# Patient Record
Sex: Female | Born: 1960
Health system: Southern US, Community
[De-identification: ages and names within clinical notes are randomized; demographics above are authoritative.]

## PROBLEM LIST (undated history)

## (undated) ENCOUNTER — Emergency Department: Admission: EM | Payer: Medicare Other | Source: Home / Self Care

## (undated) DIAGNOSIS — J189 Pneumonia, unspecified organism: Secondary | ICD-10-CM

## (undated) DIAGNOSIS — S32010A Wedge compression fracture of first lumbar vertebra, initial encounter for closed fracture: Secondary | ICD-10-CM

## (undated) DIAGNOSIS — K766 Portal hypertension: Secondary | ICD-10-CM

## (undated) DIAGNOSIS — D649 Anemia, unspecified: Secondary | ICD-10-CM

## (undated) DIAGNOSIS — K599 Functional intestinal disorder, unspecified: Secondary | ICD-10-CM

## (undated) DIAGNOSIS — K7460 Unspecified cirrhosis of liver: Secondary | ICD-10-CM

## (undated) DIAGNOSIS — Z8719 Personal history of other diseases of the digestive system: Secondary | ICD-10-CM

## (undated) DIAGNOSIS — I8500 Esophageal varices without bleeding: Secondary | ICD-10-CM

## (undated) DIAGNOSIS — I4892 Unspecified atrial flutter: Secondary | ICD-10-CM

## (undated) DIAGNOSIS — K759 Inflammatory liver disease, unspecified: Secondary | ICD-10-CM

## (undated) DIAGNOSIS — I959 Hypotension, unspecified: Secondary | ICD-10-CM

## (undated) DIAGNOSIS — K219 Gastro-esophageal reflux disease without esophagitis: Secondary | ICD-10-CM

## (undated) DIAGNOSIS — I499 Cardiac arrhythmia, unspecified: Secondary | ICD-10-CM

## (undated) DIAGNOSIS — C801 Malignant (primary) neoplasm, unspecified: Secondary | ICD-10-CM

## (undated) DIAGNOSIS — I10 Essential (primary) hypertension: Secondary | ICD-10-CM

## (undated) HISTORY — DX: Anemia, unspecified: D64.9

## (undated) HISTORY — DX: Esophageal varices without bleeding: I85.00

## (undated) HISTORY — DX: Unspecified cirrhosis of liver: K74.60

## (undated) HISTORY — PX: ABDOMINAL HYSTERECTOMY: SHX81

## (undated) HISTORY — DX: Unspecified atrial flutter: I48.92

## (undated) HISTORY — PX: TONSILLECTOMY: SUR1361

## (undated) HISTORY — DX: Portal hypertension: K76.6

## (undated) HISTORY — PX: APPENDECTOMY: SHX54

---

## 2013-03-28 ENCOUNTER — Emergency Department: Payer: Self-pay | Admitting: Emergency Medicine

## 2018-10-09 DIAGNOSIS — J209 Acute bronchitis, unspecified: Secondary | ICD-10-CM | POA: Diagnosis not present

## 2019-01-18 ENCOUNTER — Emergency Department: Payer: Medicare Other

## 2019-01-18 ENCOUNTER — Inpatient Hospital Stay
Admission: EM | Admit: 2019-01-18 | Discharge: 2019-01-19 | DRG: 433 | Disposition: A | Discharge status: Home / Self Care | Payer: Medicare Other | Attending: Internal Medicine | Admitting: Internal Medicine

## 2019-01-18 ENCOUNTER — Encounter: Payer: Self-pay | Admitting: Emergency Medicine

## 2019-01-18 ENCOUNTER — Other Ambulatory Visit: Payer: Self-pay

## 2019-01-18 DIAGNOSIS — I4892 Unspecified atrial flutter: Secondary | ICD-10-CM | POA: Diagnosis present

## 2019-01-18 DIAGNOSIS — K7460 Unspecified cirrhosis of liver: Secondary | ICD-10-CM | POA: Diagnosis present

## 2019-01-18 DIAGNOSIS — Z9119 Patient's noncompliance with other medical treatment and regimen: Secondary | ICD-10-CM | POA: Diagnosis not present

## 2019-01-18 DIAGNOSIS — Z9114 Patient's other noncompliance with medication regimen: Secondary | ICD-10-CM

## 2019-01-18 DIAGNOSIS — Z90710 Acquired absence of both cervix and uterus: Secondary | ICD-10-CM | POA: Diagnosis not present

## 2019-01-18 DIAGNOSIS — D649 Anemia, unspecified: Secondary | ICD-10-CM

## 2019-01-18 DIAGNOSIS — D638 Anemia in other chronic diseases classified elsewhere: Secondary | ICD-10-CM | POA: Diagnosis present

## 2019-01-18 DIAGNOSIS — Z7289 Other problems related to lifestyle: Secondary | ICD-10-CM

## 2019-01-18 DIAGNOSIS — E871 Hypo-osmolality and hyponatremia: Secondary | ICD-10-CM | POA: Diagnosis present

## 2019-01-18 DIAGNOSIS — R0600 Dyspnea, unspecified: Secondary | ICD-10-CM | POA: Diagnosis not present

## 2019-01-18 DIAGNOSIS — Z1159 Encounter for screening for other viral diseases: Secondary | ICD-10-CM

## 2019-01-18 DIAGNOSIS — Z8572 Personal history of non-Hodgkin lymphomas: Secondary | ICD-10-CM | POA: Diagnosis not present

## 2019-01-18 DIAGNOSIS — C801 Malignant (primary) neoplasm, unspecified: Secondary | ICD-10-CM | POA: Insufficient documentation

## 2019-01-18 DIAGNOSIS — Z716 Tobacco abuse counseling: Secondary | ICD-10-CM | POA: Diagnosis not present

## 2019-01-18 DIAGNOSIS — F17210 Nicotine dependence, cigarettes, uncomplicated: Secondary | ICD-10-CM | POA: Diagnosis present

## 2019-01-18 DIAGNOSIS — K7031 Alcoholic cirrhosis of liver with ascites: Secondary | ICD-10-CM | POA: Diagnosis not present

## 2019-01-18 DIAGNOSIS — I10 Essential (primary) hypertension: Secondary | ICD-10-CM | POA: Diagnosis present

## 2019-01-18 DIAGNOSIS — Z66 Do not resuscitate: Secondary | ICD-10-CM | POA: Diagnosis present

## 2019-01-18 DIAGNOSIS — R188 Other ascites: Secondary | ICD-10-CM | POA: Diagnosis present

## 2019-01-18 DIAGNOSIS — K709 Alcoholic liver disease, unspecified: Secondary | ICD-10-CM | POA: Diagnosis present

## 2019-01-18 DIAGNOSIS — I361 Nonrheumatic tricuspid (valve) insufficiency: Secondary | ICD-10-CM | POA: Diagnosis not present

## 2019-01-18 DIAGNOSIS — R0602 Shortness of breath: Secondary | ICD-10-CM | POA: Diagnosis not present

## 2019-01-18 DIAGNOSIS — Z720 Tobacco use: Secondary | ICD-10-CM | POA: Diagnosis not present

## 2019-01-18 DIAGNOSIS — D62 Acute posthemorrhagic anemia: Secondary | ICD-10-CM | POA: Diagnosis present

## 2019-01-18 HISTORY — DX: Malignant (primary) neoplasm, unspecified: C80.1

## 2019-01-18 HISTORY — DX: Essential (primary) hypertension: I10

## 2019-01-18 LAB — COMPREHENSIVE METABOLIC PANEL
ALT: 31 U/L (ref 0–44)
AST: 85 U/L — ABNORMAL HIGH (ref 15–41)
Albumin: 4.1 g/dL (ref 3.5–5.0)
Alkaline Phosphatase: 404 U/L — ABNORMAL HIGH (ref 38–126)
Anion gap: 12 (ref 5–15)
BUN: 8 mg/dL (ref 6–20)
CO2: 19 mmol/L — ABNORMAL LOW (ref 22–32)
Calcium: 9 mg/dL (ref 8.9–10.3)
Chloride: 96 mmol/L — ABNORMAL LOW (ref 98–111)
Creatinine, Ser: 0.49 mg/dL (ref 0.44–1.00)
GFR calc Af Amer: >60 mL/min (ref >60)
GFR calc non Af Amer: >60 mL/min (ref >60)
Glucose, Bld: 109 mg/dL — ABNORMAL HIGH (ref 70–99)
Potassium: 4.5 mmol/L (ref 3.5–5.1)
Sodium: 127 mmol/L — ABNORMAL LOW (ref 135–145)
Total Bilirubin: 2 mg/dL — ABNORMAL HIGH (ref 0.3–1.2)
Total Protein: 7 g/dL (ref 6.5–8.1)

## 2019-01-18 LAB — IRON AND TIBC
Iron: 67 ug/dL (ref 28–170)
Saturation Ratios: 12 % (ref 10.4–31.8)
TIBC: 556 ug/dL — ABNORMAL HIGH (ref 250–450)
UIBC: 489 ug/dL

## 2019-01-18 LAB — TROPONIN I
Troponin I: <0.03 ng/mL (ref <0.03)
Troponin I: <0.03 ng/mL (ref <0.03)

## 2019-01-18 LAB — ABO/RH: ABO/RH(D): O POS

## 2019-01-18 LAB — BRAIN NATRIURETIC PEPTIDE: B Natriuretic Peptide: 771 pg/mL — ABNORMAL HIGH (ref 0.0–100.0)

## 2019-01-18 LAB — HEMOGLOBIN AND HEMATOCRIT, BLOOD
HCT: 26.4 % — ABNORMAL LOW (ref 36.0–46.0)
Hemoglobin: 8.3 g/dL — ABNORMAL LOW (ref 12.0–15.0)

## 2019-01-18 LAB — SARS CORONAVIRUS 2 BY RT PCR (HOSPITAL ORDER, PERFORMED IN ~~LOC~~ HOSPITAL LAB): SARS Coronavirus 2: NEGATIVE

## 2019-01-18 LAB — CBC WITH DIFFERENTIAL/PLATELET
Abs Immature Granulocytes: 0.08 10*3/uL — ABNORMAL HIGH (ref 0.00–0.07)
Basophils Absolute: 0.1 10*3/uL (ref 0.0–0.1)
Basophils Relative: 1 %
Eosinophils Absolute: 0.2 10*3/uL (ref 0.0–0.5)
Eosinophils Relative: 1 %
HCT: 24.7 % — ABNORMAL LOW (ref 36.0–46.0)
Hemoglobin: 7.5 g/dL — ABNORMAL LOW (ref 12.0–15.0)
Immature Granulocytes: 1 %
Lymphocytes Relative: 13 %
Lymphs Abs: 1.9 10*3/uL (ref 0.7–4.0)
MCH: 23.2 pg — ABNORMAL LOW (ref 26.0–34.0)
MCHC: 30.4 g/dL (ref 30.0–36.0)
MCV: 76.5 fL — ABNORMAL LOW (ref 80.0–100.0)
Monocytes Absolute: 1.6 10*3/uL — ABNORMAL HIGH (ref 0.1–1.0)
Monocytes Relative: 11 %
Neutro Abs: 10.6 10*3/uL — ABNORMAL HIGH (ref 1.7–7.7)
Neutrophils Relative %: 73 %
Platelets: 379 10*3/uL (ref 150–400)
RBC: 3.23 MIL/uL — ABNORMAL LOW (ref 3.87–5.11)
RDW: 16.9 % — ABNORMAL HIGH (ref 11.5–15.5)
WBC: 14.4 10*3/uL — ABNORMAL HIGH (ref 4.0–10.5)
nRBC: 0 % (ref 0.0–0.2)

## 2019-01-18 LAB — FOLATE: Folate: 8.9 ng/mL (ref >5.9)

## 2019-01-18 LAB — PREPARE RBC (CROSSMATCH)

## 2019-01-18 IMAGING — DX PORTABLE CHEST - 1 VIEW
1 series · 1 of 1 positions shown · non-contrast
Comparison: None.

CLINICAL DATA: Shortness of breath, lower extremity swelling.

EXAM:
PORTABLE CHEST 1 VIEW

[chest ap]
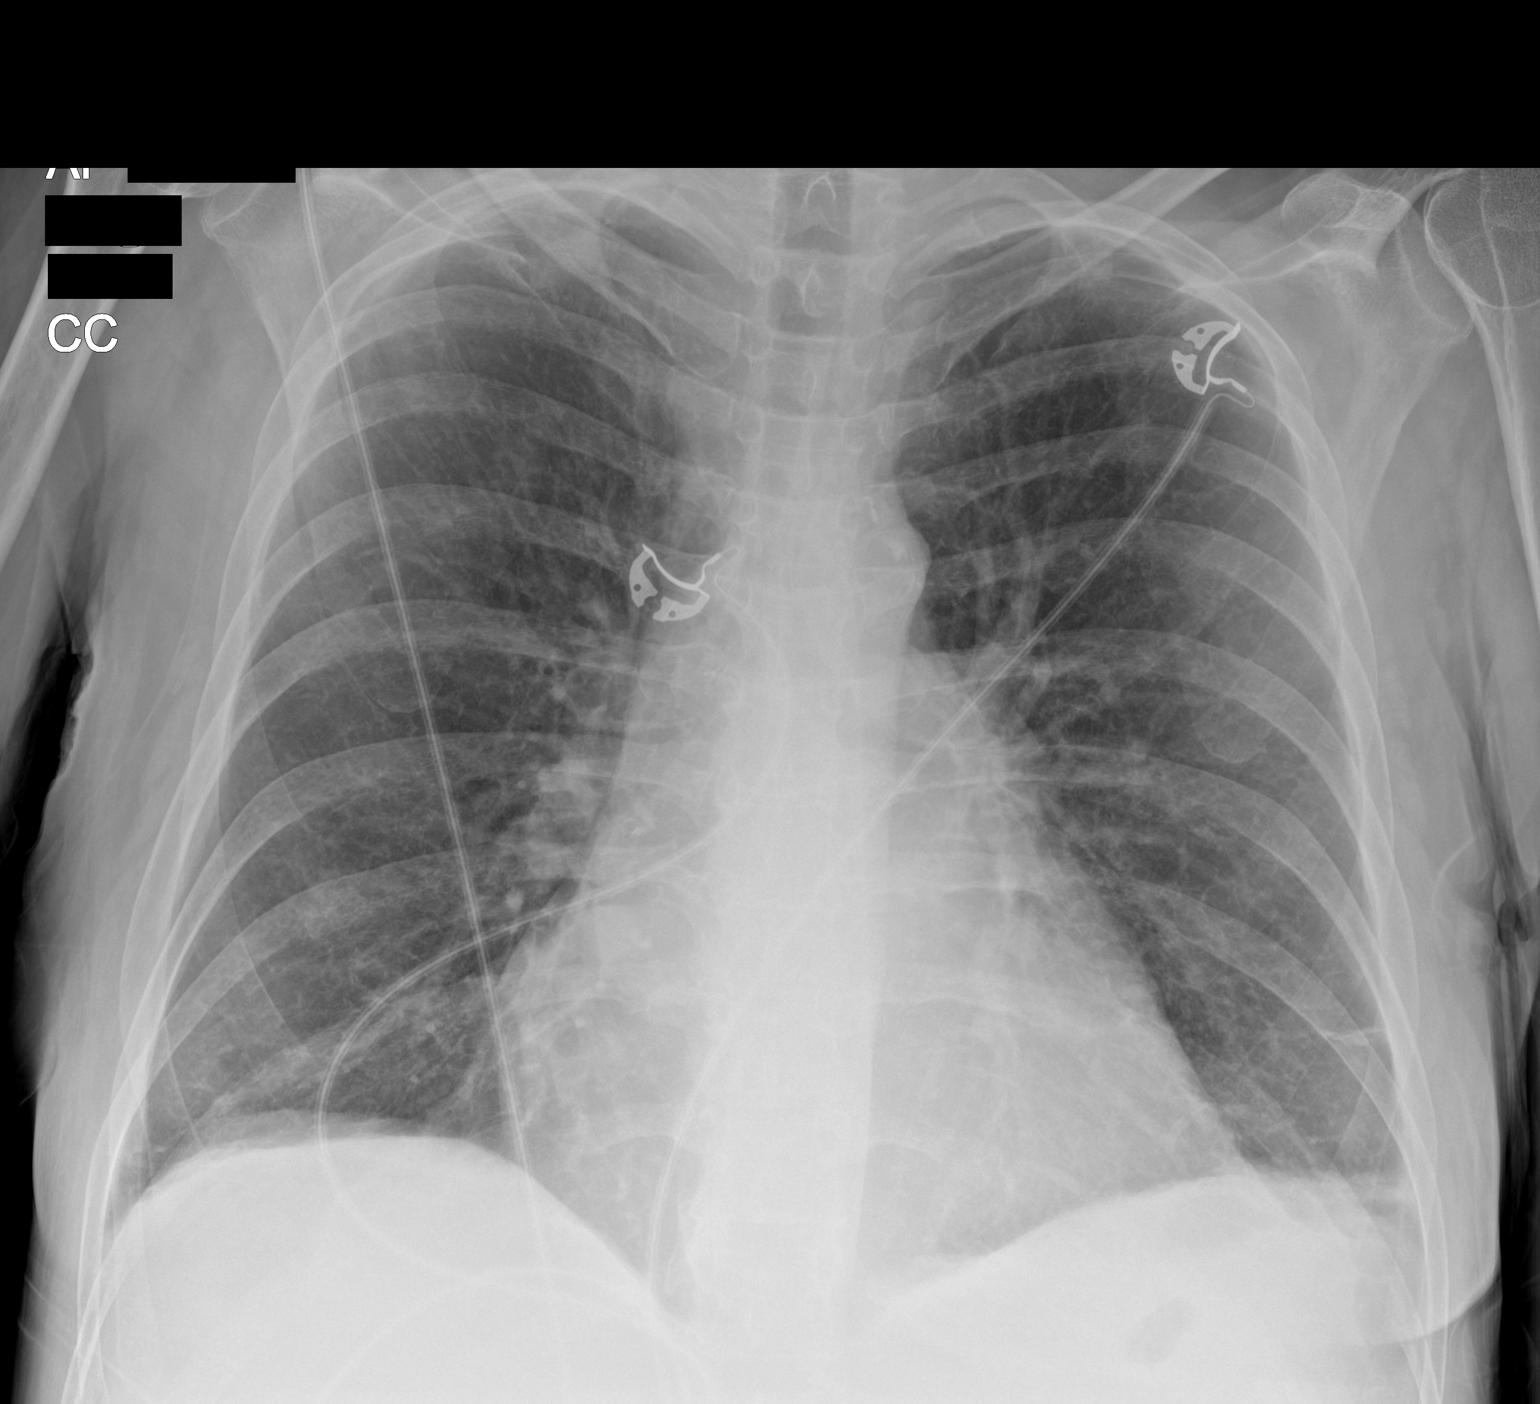

[1 of 1 positions shown; findings below may reference images not displayed]

FINDINGS: The heart size and mediastinal contours are within normal limits. No
pneumothorax or pleural effusion is noted. Right lung is clear.
Minimal left basilar subsegmental atelectasis or scarring is noted.
The visualized skeletal structures are unremarkable.
IMPRESSION: Minimal left basilar subsegmental atelectasis or scarring.

## 2019-01-18 IMAGING — CT CT ABDOMEN AND PELVIS WITH CONTRAST
2 of 5 series · 15 of 46 positions shown, 17 images · IV contrast (APPLIED)
Comparison: None.

CLINICAL DATA: Lower extremity swelling

EXAM:
CT ABDOMEN AND PELVIS WITH CONTRAST
TECHNIQUE: Multidetector CT imaging of the abdomen and pelvis was performed
using the standard protocol following bolus administration of
intravenous contrast.
CONTRAST:  100mL OMNIPAQUE IOHEXOL 300 MG/ML  SOLN

[Series 2: axial st · axial · 0.74mm/px · z∈[-1220,-790]mm · 12 of 98 slices shown, 14 images]
[im 6/98  soft-tissue]
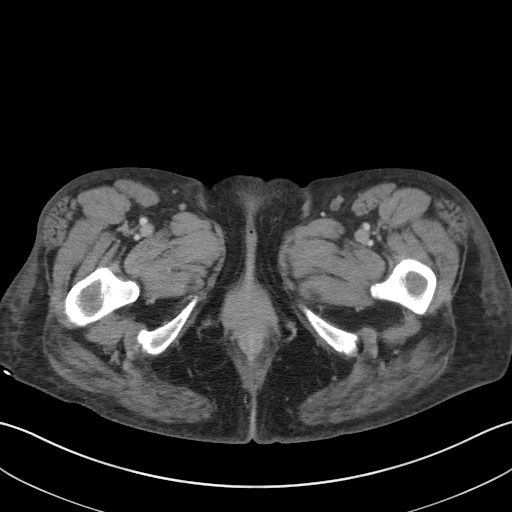
[im 6/98  bone]
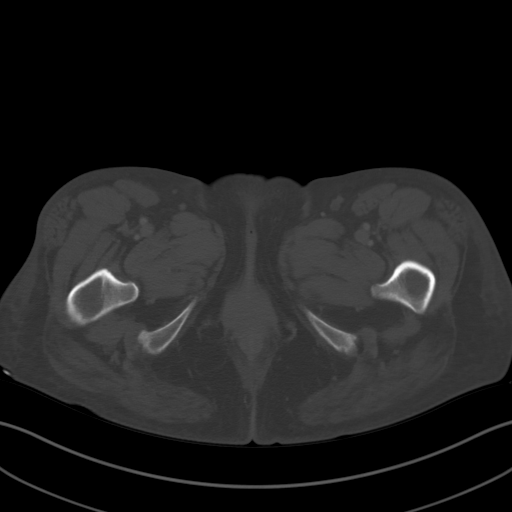
[im 16/98  soft-tissue]
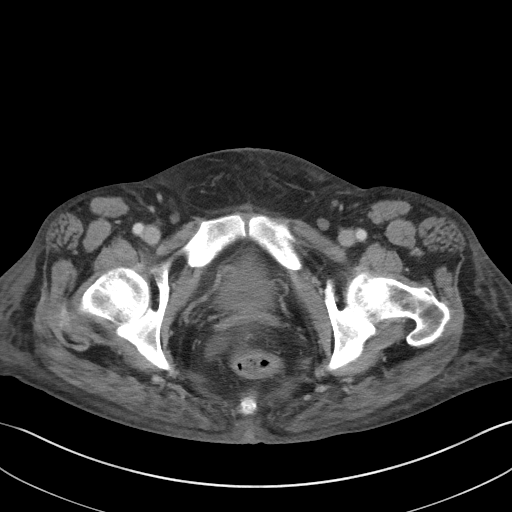
[im 21/98  soft-tissue]
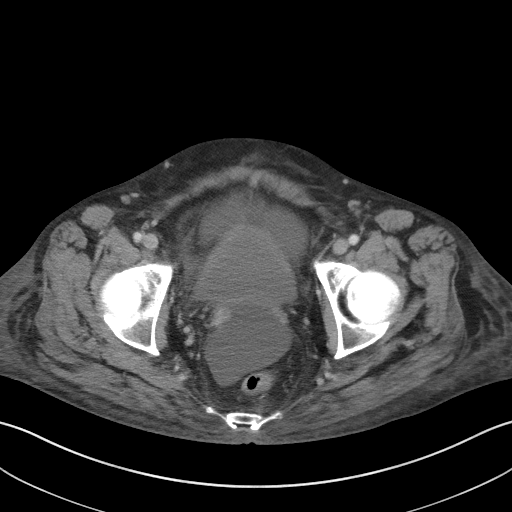
[im 31/98  soft-tissue]
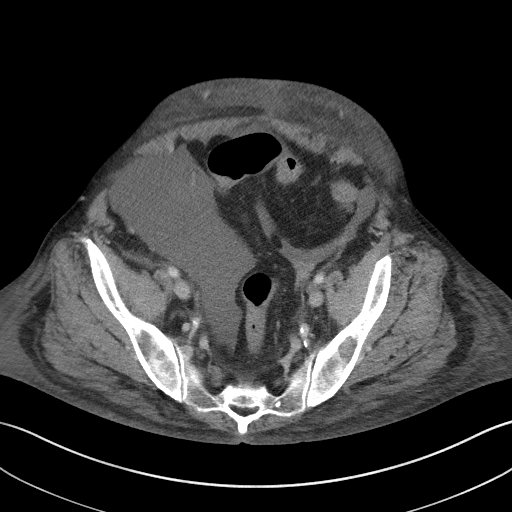
[im 36/98  soft-tissue]
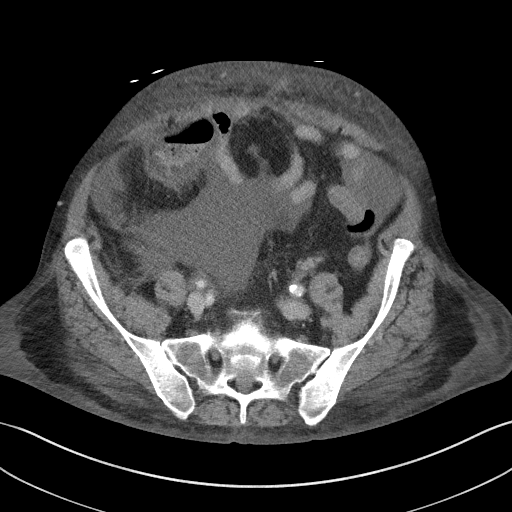
[im 46/98  soft-tissue]
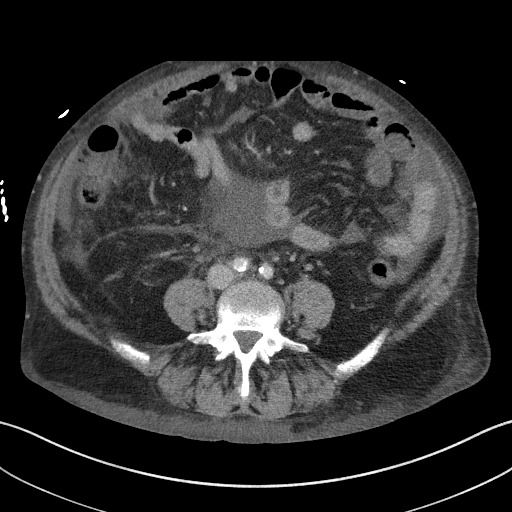
[im 52/98  soft-tissue]
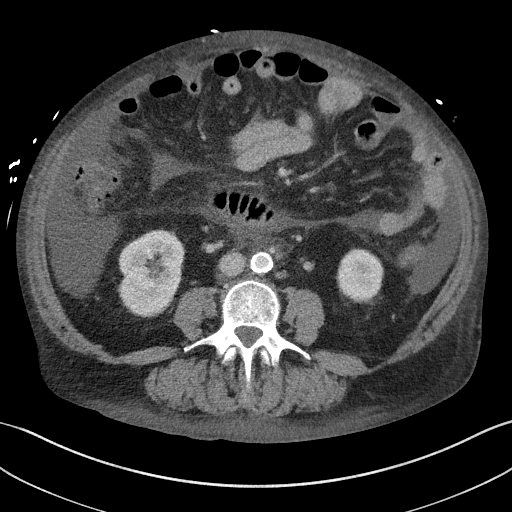
[im 62/98  soft-tissue]
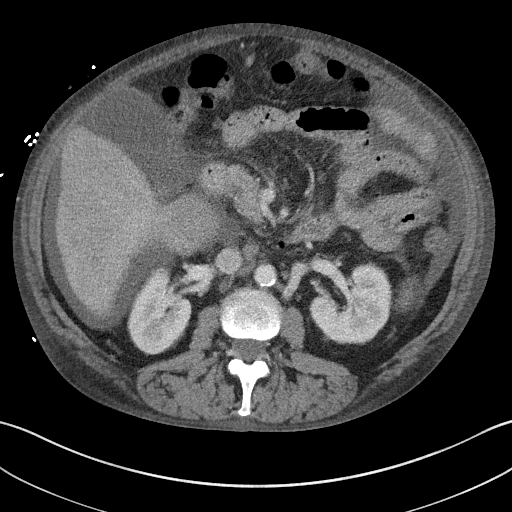
[im 67/98  soft-tissue]
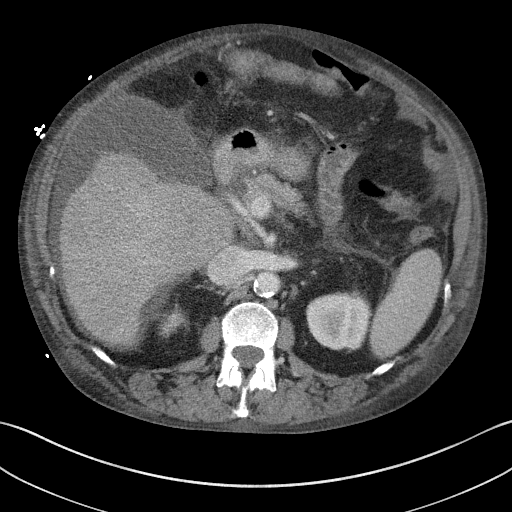
[im 67/98  bone]
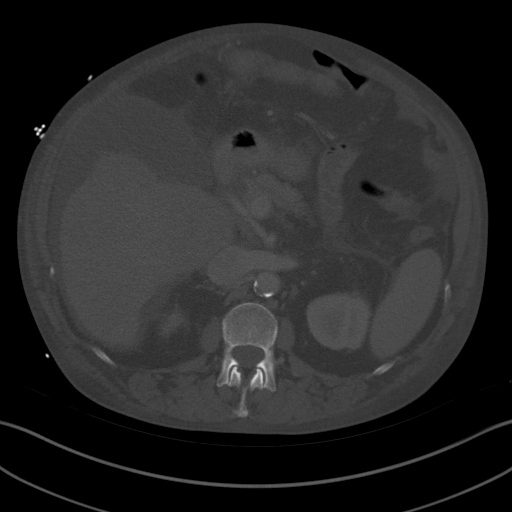
[im 77/98  soft-tissue]
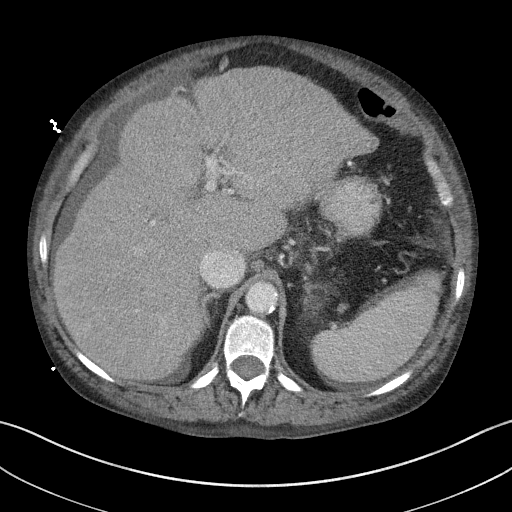
[im 82/98  soft-tissue]
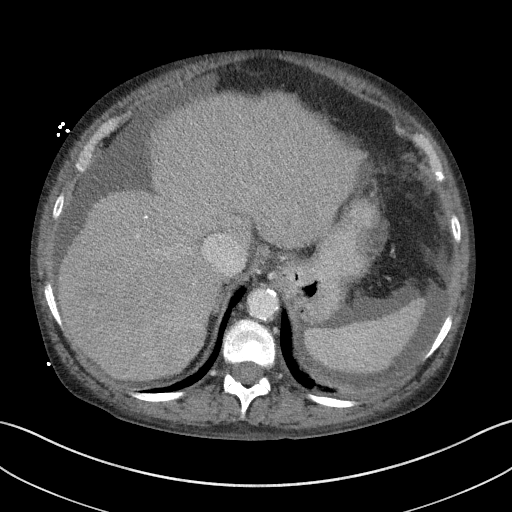
[im 92/98  soft-tissue]
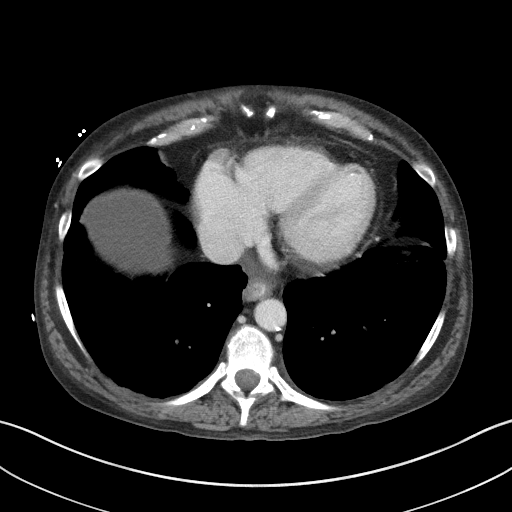

[Series 6: coronal st · coronal · 0.68mm/px · 3 of 105 slices shown]
[im 35/105  soft-tissue]
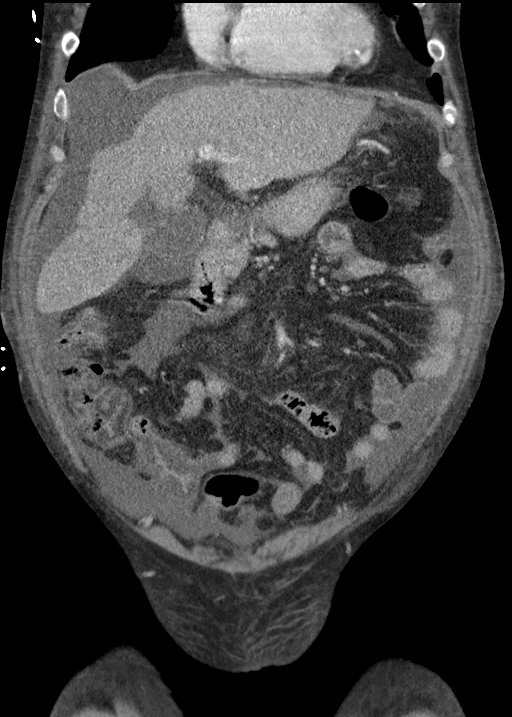
[im 47/105  soft-tissue]
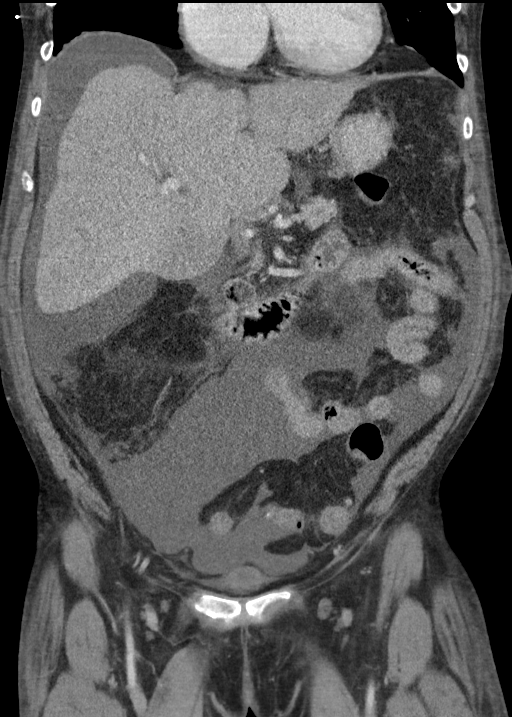
[im 58/105  soft-tissue]
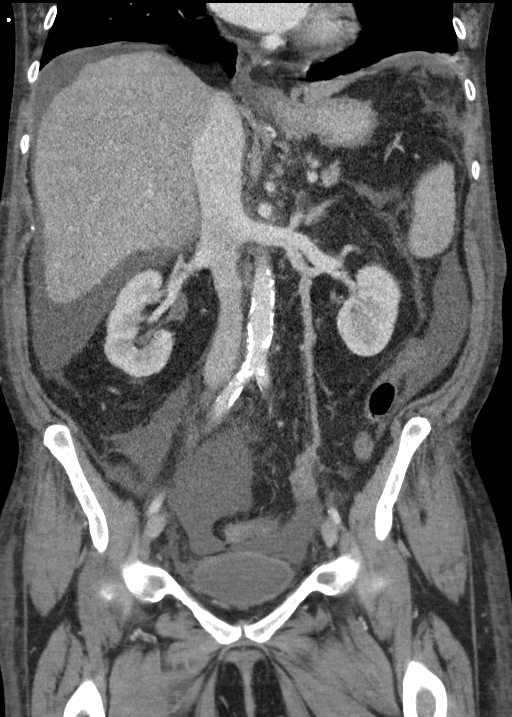

[15 of 46 positions shown; findings below may reference images not displayed]

FINDINGS: LOWER CHEST: There is no basilar pleural or apical pericardial
effusion.

HEPATOBILIARY: Diffusely nodular hepatic contours with relative
hypertrophy of the caudate and left hepatic lobe, consistent with
hepatic cirrhosis. No focal liver lesion. No biliary dilatation.
There is intermediate volume ascites throughout the abdomen.
Gallbladder is distended.

PANCREAS: The pancreatic parenchymal contours are normal and there
is no ductal dilatation. There is no peripancreatic fluid
collection.

SPLEEN: Normal.

ADRENALS/URINARY TRACT:

--Adrenal glands: Normal.

--Right kidney/ureter: No hydronephrosis, nephroureterolithiasis,
perinephric stranding or solid renal mass.

--Left kidney/ureter: No hydronephrosis, nephroureterolithiasis,
perinephric stranding or solid renal mass.

--Urinary bladder: Normal for degree of distention

STOMACH/BOWEL:

--Stomach/Duodenum: There is no hiatal hernia or other gastric
abnormality. The duodenal course and caliber are normal.

--Small bowel: No dilatation or inflammation.

--Colon: No focal abnormality.

--Appendix: Not clearly visualized

VASCULAR/LYMPHATIC: There is aortic atherosclerosis without
hemodynamically significant stenosis. The portal vein, splenic vein,
superior mesenteric vein and IVC are patent. No abdominal or pelvic
lymphadenopathy.

REPRODUCTIVE: Status post hysterectomy. No adnexal mass.

MUSCULOSKELETAL. No bony spinal canal stenosis or focal osseous
abnormality.

OTHER: None.
IMPRESSION: 1. Hepatic cirrhosis without acute abdominal or pelvic abnormality.
2. Intermediate volume abdominopelvic ascites without other stigmata
of portal hypertension.
3.  Aortic atherosclerosis ([76]-[76]).

## 2019-01-18 MED ORDER — SPIRONOLACTONE 25 MG PO TABS
25.0000 mg | ORAL_TABLET | Freq: Every day | ORAL | Status: DC
  Administered 2019-01-18: 19:00:00 25 mg via ORAL
  Filled 2019-01-18: qty 1

## 2019-01-18 MED ORDER — ENOXAPARIN SODIUM 40 MG/0.4ML ~~LOC~~ SOLN
40.0000 mg | SUBCUTANEOUS | Status: DC
  Administered 2019-01-18: 23:00:00 40 mg via SUBCUTANEOUS
  Filled 2019-01-18: qty 0.4

## 2019-01-18 MED ORDER — ALUM & MAG HYDROXIDE-SIMETH 200-200-20 MG/5ML PO SUSP
30.0000 mL | Freq: Once | ORAL | Status: AC
  Administered 2019-01-18: 30 mL via ORAL
  Filled 2019-01-18: qty 30

## 2019-01-18 MED ORDER — IOHEXOL 300 MG/ML  SOLN
100.0000 mL | Freq: Once | INTRAMUSCULAR | Status: AC | PRN
  Administered 2019-01-18: 100 mL via INTRAVENOUS
  Filled 2019-01-18: qty 100

## 2019-01-18 MED ORDER — ONDANSETRON HCL 4 MG/2ML IJ SOLN
4.0000 mg | Freq: Once | INTRAMUSCULAR | Status: AC
  Administered 2019-01-18: 4 mg via INTRAVENOUS
  Filled 2019-01-18: qty 2

## 2019-01-18 MED ORDER — FUROSEMIDE 10 MG/ML IJ SOLN
40.0000 mg | Freq: Two times a day (BID) | INTRAMUSCULAR | Status: DC
  Administered 2019-01-18 – 2019-01-19 (×2): 40 mg via INTRAVENOUS
  Filled 2019-01-18 (×2): qty 4

## 2019-01-18 MED ORDER — MORPHINE SULFATE (PF) 4 MG/ML IV SOLN
4.0000 mg | Freq: Once | INTRAVENOUS | Status: AC
  Administered 2019-01-18: 4 mg via INTRAVENOUS
  Filled 2019-01-18: qty 1

## 2019-01-18 MED ORDER — OXYCODONE HCL 5 MG PO TABS
5.0000 mg | ORAL_TABLET | ORAL | Status: DC | PRN
  Administered 2019-01-18 – 2019-01-19 (×3): 5 mg via ORAL
  Filled 2019-01-18 (×3): qty 1

## 2019-01-18 MED ORDER — SODIUM CHLORIDE 0.9 % IV SOLN: 10.0000 mL/h | Freq: Once | INTRAVENOUS | Status: DC

## 2019-01-18 NOTE — ED Notes (Signed)
Patient went to ct scan

## 2019-01-18 NOTE — ED Notes (Signed)
ED TO INPATIENT HANDOFF REPORT  ED Nurse Name and Phone #:  Jinny Blossom [phone]  S Name/Age/Gender Anita Reed 58 y.o. female Room/Bed: ED30A/ED30A  Code Status   Code Status: DNR  Home/SNF/Other Home Patient oriented to: self, place, time and situation Is this baseline? Yes   Triage Complete: Triage complete  Chief Complaint swelling  Triage Note Says for 3 weeks gradual onset and worsening with lower extremity swelling and now abdominal swelling and shortness of breath.   Allergies No Known Allergies  Level of Care/Admitting Diagnosis ED Disposition    ED Disposition Condition [facility] Area: [place] [[id number]]  Level of Care: Med-Surg [16]  Covid Evaluation: Screening Protocol (No Symptoms)  Diagnosis: Ascites [272536]  Admitting Physician: Otila Back Branch  Attending Physician: Otila Back [[id number]]  Estimated length of stay: past midnight tomorrow  Certification:: I certify this patient will need inpatient services for at least 2 midnights  PT Class (Do Not Modify): Inpatient [101]  PT Acc Code (Do Not Modify): Private [1]       B Medical/Surgery History Past Medical History:  Diagnosis Date  . Cancer ([place])   . Hypertension    Past Surgical History:  Procedure Laterality Date  . ABDOMINAL HYSTERECTOMY    . APPENDECTOMY       A IV Location/Drains/Wounds Patient Lines/Drains/Airways Status   Active Line/Drains/Airways    Name:   Placement date:   Placement time:   Site:   Days:   Peripheral IV 01/18/19 Right Forearm   01/18/19    1238    Forearm   less than 1          Intake/Output Last 24 hours No intake or output data in the 24 hours ending 01/18/19 1713  Labs/Imaging Results for orders placed or performed during the hospital encounter of 01/18/19 (from the past 48 hour(s))  Brain natriuretic peptide     Status: Abnormal   Collection Time: 01/18/19 12:20 PM  Result Value Ref Range   B Natriuretic  Peptide 771.0 (H) 0.0 - 100.0 pg/mL    Comment: Performed at [facility], [address] [place], [place] [address]  CBC with Differential     Status: Abnormal   Collection Time: 01/18/19 12:20 PM  Result Value Ref Range   WBC 14.4 (H) 4.0 - 10.5 K/uL   RBC 3.23 (L) 3.87 - 5.11 MIL/uL   Hemoglobin 7.5 (L) 12.0 - 15.0 g/dL    Comment: Reticulocyte Hemoglobin testing may be clinically indicated, consider ordering this additional test [id number]    HCT 24.7 (L) 36.0 - 46.0 %   MCV 76.5 (L) 80.0 - 100.0 fL   MCH 23.2 (L) 26.0 - 34.0 pg   MCHC 30.4 30.0 - 36.0 g/dL   RDW 16.9 (H) 11.5 - 15.5 %   Platelets 379 150 - 400 K/uL   nRBC 0.0 0.0 - 0.2 %   Neutrophils Relative % 73 %   Neutro Abs 10.6 (H) 1.7 - 7.7 K/uL   Lymphocytes Relative 13 %   Lymphs Abs 1.9 0.7 - 4.0 K/uL   Monocytes Relative 11 %   Monocytes Absolute 1.6 (H) 0.1 - 1.0 K/uL   Eosinophils Relative 1 %   Eosinophils Absolute 0.2 0.0 - 0.5 K/uL   Basophils Relative 1 %   Basophils Absolute 0.1 0.0 - 0.1 K/uL   Immature Granulocytes 1 %   Abs Immature Granulocytes 0.08 (H) 0.00 - 0.07 K/uL    Comment:  Performed at [facility], [address] [place], [place] [address]  Comprehensive metabolic panel     Status: Abnormal   Collection Time: 01/18/19 12:20 PM  Result Value Ref Range   Sodium 127 (L) 135 - 145 mmol/L   Potassium 4.5 3.5 - 5.1 mmol/L   Chloride 96 (L) 98 - 111 mmol/L   CO2 19 (L) 22 - 32 mmol/L   Glucose, Bld 109 (H) 70 - 99 mg/dL   BUN 8 6 - 20 mg/dL   Creatinine, Ser 0.49 0.44 - 1.00 mg/dL   Calcium 9.0 8.9 - 10.3 mg/dL   Total Protein 7.0 6.5 - 8.1 g/dL   Albumin 4.1 3.5 - 5.0 g/dL   AST 85 (H) 15 - 41 U/L   ALT 31 0 - 44 U/L   Alkaline Phosphatase 404 (H) 38 - 126 U/L   Total Bilirubin 2.0 (H) 0.3 - 1.2 mg/dL   GFR calc non Af Amer >60 >60 mL/min   GFR calc Af Amer >60 >60 mL/min   Anion gap 12 5 - 15    Comment: Performed at [facility], [address]  [place], [place] [address]  Troponin I - ONCE - STAT     Status: None   Collection Time: 01/18/19 12:20 PM  Result Value Ref Range   Troponin I <0.03 <0.03 ng/mL    Comment: Performed at [facility], [address] [place], [place] [address]  SARS Coronavirus 2 (CEPHEID - Performed in [place] hospital lab), Hosp Order     Status: None   Collection Time: 01/18/19 12:20 PM  Result Value Ref Range   SARS Coronavirus 2 NEGATIVE NEGATIVE    Comment: (NOTE) If result is NEGATIVE SARS-CoV-2 target nucleic acids are NOT DETECTED. The SARS-CoV-2 RNA is generally detectable in upper and lower  respiratory specimens during the acute phase of infection. The lowest  concentration of SARS-CoV-2 viral copies this assay can detect is 250  copies / mL. A negative result does not preclude SARS-CoV-2 infection  and should not be used as the sole basis for treatment or other  patient management decisions.  A negative result may occur with  improper specimen collection / handling, submission of specimen other  than nasopharyngeal swab, presence of viral mutation(s) within the  areas targeted by this assay, and inadequate number of viral copies  (<250 copies / mL). A negative result must be combined with clinical  observations, patient history, and epidemiological information. If result is POSITIVE SARS-CoV-2 target nucleic acids are DETECTED. The SARS-CoV-2 RNA is generally detectable in upper and lower  respiratory specimens dur ing the acute phase of infection.  Positive  results are indicative of active infection with SARS-CoV-2.  Clinical  correlation with patient history and other diagnostic information is  necessary to determine patient infection status.  Positive results do  not rule out bacterial infection or co-infection with other viruses. If result is PRESUMPTIVE POSTIVE SARS-CoV-2 nucleic acids MAY BE PRESENT.   A presumptive positive result was obtained on the submitted specimen   and confirmed on repeat testing.  While 2019 novel coronavirus  (SARS-CoV-2) nucleic acids may be present in the submitted sample  additional confirmatory testing may be necessary for epidemiological  and / or clinical management purposes  to differentiate between  SARS-CoV-2 and other Sarbecovirus currently known to infect humans.  If clinically indicated additional testing with an alternate test  methodology [phone]) is advised. The SARS-CoV-2 RNA is generally  detectable  in upper and lower respiratory sp ecimens during the acute  phase of infection. The expected result is Negative. Fact Sheet for Patients:  StrictlyIdeas.no Fact Sheet for Healthcare Providers: BankingDealers.co.za This test is not yet approved or cleared by the [place] FDA and has been authorized for detection and/or diagnosis of SARS-CoV-2 by FDA under an Emergency Use Authorization (EUA).  This EUA will remain in effect (meaning this test can be used) for the duration of the COVID-19 declaration under Section 564(b)(1) of the Act, 21 U.S.C. section 360bbb-3(b)(1), unless the authorization is terminated or revoked sooner. Performed at [facility], [address] [place], [place] [address]   ABO/Rh     Status: None   Collection Time: 01/18/19 12:20 PM  Result Value Ref Range   ABO/RH(D)      O POS Performed at [facility] [place], [address] [place], [place] [address]   Prepare RBC     Status: None   Collection Time: 01/18/19  2:34 PM  Result Value Ref Range   Order Confirmation      ORDER PROCESSED BY BLOOD BANK Performed at [facility], [address] [place], [place] [address]   Type and screen [place]     Status: None (Preliminary result)   Collection Time: 01/18/19  3:02 PM  Result Value Ref Range   ABO/RH(D) O POS    Antibody Screen NEG    Sample Expiration 01/21/2019,2359    Unit Number  [id number]    Blood Component Type RED CELLS,LR    Unit division 00    Status of Unit ISSUED    Transfusion Status OK TO TRANSFUSE    Crossmatch Result      Compatible Performed at [facility], [place], [place] [address]    Ct Abdomen Pelvis W Contrast  Result Date: 01/18/2019 CLINICAL DATA:  Lower extremity swelling EXAM: CT ABDOMEN AND PELVIS WITH CONTRAST TECHNIQUE: Multidetector CT imaging of the abdomen and pelvis was performed using the standard protocol following bolus administration of intravenous contrast. CONTRAST:  140mL OMNIPAQUE IOHEXOL 300 MG/ML  SOLN COMPARISON:  None. FINDINGS: LOWER CHEST: There is no basilar pleural or apical pericardial effusion. HEPATOBILIARY: Diffusely nodular hepatic contours with relative hypertrophy of the caudate and left hepatic lobe, consistent with hepatic cirrhosis. No focal liver lesion. No biliary dilatation. There is intermediate volume ascites throughout the abdomen. Gallbladder is distended. PANCREAS: The pancreatic parenchymal contours are normal and there is no ductal dilatation. There is no peripancreatic fluid collection. SPLEEN: Normal. ADRENALS/URINARY TRACT: --Adrenal glands: Normal. --Right kidney/ureter: No hydronephrosis, nephroureterolithiasis, perinephric stranding or solid renal mass. --Left kidney/ureter: No hydronephrosis, nephroureterolithiasis, perinephric stranding or solid renal mass. --Urinary bladder: Normal for degree of distention STOMACH/BOWEL: --Stomach/Duodenum: There is no hiatal hernia or other gastric abnormality. The duodenal course and caliber are normal. --Small bowel: No dilatation or inflammation. --Colon: No focal abnormality. --Appendix: Not clearly visualized VASCULAR/LYMPHATIC: There is aortic atherosclerosis without hemodynamically significant stenosis. The portal vein, splenic vein, superior mesenteric vein and IVC are patent. No abdominal or pelvic lymphadenopathy. REPRODUCTIVE:  Status post hysterectomy. No adnexal mass. MUSCULOSKELETAL. No bony spinal canal stenosis or focal osseous abnormality. OTHER: None. IMPRESSION: 1. Hepatic cirrhosis without acute abdominal or pelvic abnormality. 2. Intermediate volume abdominopelvic ascites without other stigmata of portal hypertension. 3.  Aortic atherosclerosis (ICD10-I70.0). Electronically Signed   By: Ulyses Jarred M.D.   On: 01/18/2019 14:26   Dg Chest Port 1 View  Result Date:  01/18/2019 CLINICAL DATA:  Shortness of breath, lower extremity swelling. EXAM: PORTABLE CHEST 1 VIEW COMPARISON:  None. FINDINGS: The heart size and mediastinal contours are within normal limits. No pneumothorax or pleural effusion is noted. Right lung is clear. Minimal left basilar subsegmental atelectasis or scarring is noted. The visualized skeletal structures are unremarkable. IMPRESSION: Minimal left basilar subsegmental atelectasis or scarring. Electronically Signed   By: Marijo Conception M.D.   On: 01/18/2019 12:46    Pending Labs Unresulted Labs (From admission, onward)    Start     Ordered   01/19/19 5102  Basic metabolic panel  Tomorrow morning,   STAT     01/18/19 1654   01/19/19 0500  CBC  Tomorrow morning,   STAT     01/18/19 1654   01/19/19 0500  Magnesium  Tomorrow morning,   STAT     01/18/19 1654   01/18/19 1655  Hepatitis panel, acute  Once,   STAT     01/18/19 1654   01/18/19 1654  Iron and TIBC  Once,   STAT     01/18/19 1654   01/18/19 1654  Vitamin B12  Once,   STAT     01/18/19 1654   01/18/19 1654  Folate  Once,   STAT     01/18/19 1654   01/18/19 1650  HIV antibody (Routine Testing)  Once,   STAT     01/18/19 1651          Vitals/Pain Today's Vitals   01/18/19 1520 01/18/19 1640 01/18/19 1656 01/18/19 1703  BP: 95/67 101/70 103/73   Pulse: (!) 117 (!) 117 (!) 118   Resp: (!) 22 (!) 21 14   Temp:  98.8 F (37.1 C) 98.4 F (36.9 C)   TempSrc:  Oral Oral   SpO2: 96% 98%  100%  Weight:      Height:       PainSc: 4        Isolation Precautions No active isolations  Medications Medications  0.9 %  sodium chloride infusion (has no administration in time range)  enoxaparin (LOVENOX) injection 40 mg (has no administration in time range)  furosemide (LASIX) injection 40 mg (has no administration in time range)  spironolactone (ALDACTONE) tablet 25 mg (has no administration in time range)  morphine 4 MG/ML injection 4 mg (4 mg Intravenous Given 01/18/19 1349)  ondansetron (ZOFRAN) injection 4 mg (4 mg Intravenous Given 01/18/19 1354)  iohexol (OMNIPAQUE) 300 MG/ML solution 100 mL (100 mLs Intravenous Contrast Given 01/18/19 1406)    Mobility walks Low fall risk   Focused Assessments See focused assessment   R Recommendations: See Admitting Provider Note  Report given to:   Additional Notes:  Blood transfusing

## 2019-01-18 NOTE — ED Provider Notes (Signed)
[facility] Emergency Department Provider Note  ____________________________________________  Time seen: Approximately 12:53 PM  I have reviewed the triage vital signs and the nursing notes.   HISTORY  Chief Complaint Leg Swelling and Shortness of Breath    HPI Anita Reed is a 58 y.o. female with a history of hypertension, presents to the emergency department with progressive shortness of breath, chest tightness and abdominal swelling for the past 3 weeks.  Patient states that she drinks wine daily and has been told that she is at increased risk for alcoholic cirrhosis.  Patient rates her abdominal pain at 8 out of 10 in intensity and describes it as tight and aching.  Patient states that she has a chronic cough that she associates with smoking.  Patient reports that she has been taking more ibuprofen lately due to her discomfort.       Past Medical History:  Diagnosis Date  . Cancer ([place])   . Hypertension     Patient Active Problem List   Diagnosis Date Noted  . Ascites 01/18/2019  . Cancer [facility])     Past Surgical History:  Procedure Laterality Date  . ABDOMINAL HYSTERECTOMY    . APPENDECTOMY      Prior to Admission medications   Not on File    Allergies Patient has no known allergies.  No family history on file.  Social History Social History   Tobacco Use  . Smoking status: Current Every Day Smoker  . Smokeless tobacco: Never Used  Substance Use Topics  . Alcohol use: Yes  . Drug use: Not on file     Review of Systems  Constitutional: No fever/chills Eyes: No visual changes. No discharge ENT: No upper respiratory complaints. Cardiovascular: no chest pain. Respiratory: no cough. No SOB. Gastrointestinal: No abdominal pain.  No nausea, no vomiting.  No diarrhea.  No constipation. Genitourinary: Negative for dysuria. No hematuria Musculoskeletal: Negative for musculoskeletal pain. Skin: Negative for rash, abrasions,  lacerations, ecchymosis. Neurological: Negative for headaches, focal weakness or numbness.   ____________________________________________   PHYSICAL EXAM:  VITAL SIGNS: ED Triage Vitals  Enc Vitals Group     BP 01/18/19 1206 124/76     Pulse Rate 01/18/19 1206 (!) 120     Resp 01/18/19 1206 20     Temp 01/18/19 1206 98 F (36.7 C)     Temp Source 01/18/19 1206 Oral     SpO2 01/18/19 1206 100 %     Weight 01/18/19 1204 140 lb (63.5 kg)     Height 01/18/19 1204 5\' 9"  (1.753 m)     Head Circumference --      Peak Flow --      Pain Score 01/18/19 1204 7     Pain Loc --      Pain Edu? --      Excl. in [place]? --      Constitutional: Alert and oriented. Well appearing and in no acute distress. Eyes: No scleral icterus. PERRL. EOMI. Head: Atraumatic. Cardiovascular: Patient is tachycardic, regular rhythm. Normal S1 and S2.  Good peripheral circulation. Respiratory: Patient is tachypneic.  Lungs CTAB. Good air entry to the bases with no decreased or absent breath sounds. Gastrointestinal: Bowel sounds 4 quadrants.  Patient's abdomen is distended and she has diffuse tenderness to palpation.  Guarding is elicited with palpation.  No palpable masses. No CVA tenderness. Musculoskeletal: Full range of motion to all extremities. No gross deformities appreciated. Neurologic:  Normal speech and language. No gross focal  neurologic deficits are appreciated.  Skin: Patient has 2+ pitting edema of the bilateral ankles. Psychiatric: Mood and affect are normal. Speech and behavior are normal. Patient exhibits appropriate insight and judgement.   ____________________________________________   LABS (all labs ordered are listed, but only abnormal results are displayed)  Labs Reviewed  BRAIN NATRIURETIC PEPTIDE - Abnormal; Notable for the following components:      Result Value   B Natriuretic Peptide 771.0 (*)    All other components within normal limits  CBC WITH DIFFERENTIAL/PLATELET -  Abnormal; Notable for the following components:   WBC 14.4 (*)    RBC 3.23 (*)    Hemoglobin 7.5 (*)    HCT 24.7 (*)    MCV 76.5 (*)    MCH 23.2 (*)    RDW 16.9 (*)    Neutro Abs 10.6 (*)    Monocytes Absolute 1.6 (*)    Abs Immature Granulocytes 0.08 (*)    All other components within normal limits  COMPREHENSIVE METABOLIC PANEL - Abnormal; Notable for the following components:   Sodium 127 (*)    Chloride 96 (*)    CO2 19 (*)    Glucose, Bld 109 (*)    AST 85 (*)    Alkaline Phosphatase 404 (*)    Total Bilirubin 2.0 (*)    All other components within normal limits  SARS CORONAVIRUS 2 (HOSPITAL ORDER, [place] [facility])  TROPONIN I  HIV ANTIBODY (ROUTINE TESTING W REFLEX)  IRON AND TIBC  VITAMIN B12  FOLATE  HEPATITIS PANEL, ACUTE  BASIC METABOLIC PANEL  CBC  MAGNESIUM  PREPARE RBC (CROSSMATCH)  TYPE AND SCREEN  ABO/RH   ____________________________________________  EKG   ____________________________________________  RADIOLOGY I personally viewed and evaluated these images as part of my medical decision making, as well as reviewing the written report by the radiologist.  Ct Abdomen Pelvis W Contrast  Result Date: 01/18/2019 CLINICAL DATA:  Lower extremity swelling EXAM: CT ABDOMEN AND PELVIS WITH CONTRAST TECHNIQUE: Multidetector CT imaging of the abdomen and pelvis was performed using the standard protocol following bolus administration of intravenous contrast. CONTRAST:  18mL OMNIPAQUE IOHEXOL 300 MG/ML  SOLN COMPARISON:  None. FINDINGS: LOWER CHEST: There is no basilar pleural or apical pericardial effusion. HEPATOBILIARY: Diffusely nodular hepatic contours with relative hypertrophy of the caudate and left hepatic lobe, consistent with hepatic cirrhosis. No focal liver lesion. No biliary dilatation. There is intermediate volume ascites throughout the abdomen. Gallbladder is distended. PANCREAS: The pancreatic parenchymal contours are normal and  there is no ductal dilatation. There is no peripancreatic fluid collection. SPLEEN: Normal. ADRENALS/URINARY TRACT: --Adrenal glands: Normal. --Right kidney/ureter: No hydronephrosis, nephroureterolithiasis, perinephric stranding or solid renal mass. --Left kidney/ureter: No hydronephrosis, nephroureterolithiasis, perinephric stranding or solid renal mass. --Urinary bladder: Normal for degree of distention STOMACH/BOWEL: --Stomach/Duodenum: There is no hiatal hernia or other gastric abnormality. The duodenal course and caliber are normal. --Small bowel: No dilatation or inflammation. --Colon: No focal abnormality. --Appendix: Not clearly visualized VASCULAR/LYMPHATIC: There is aortic atherosclerosis without hemodynamically significant stenosis. The portal vein, splenic vein, superior mesenteric vein and IVC are patent. No abdominal or pelvic lymphadenopathy. REPRODUCTIVE: Status post hysterectomy. No adnexal mass. MUSCULOSKELETAL. No bony spinal canal stenosis or focal osseous abnormality. OTHER: None. IMPRESSION: 1. Hepatic cirrhosis without acute abdominal or pelvic abnormality. 2. Intermediate volume abdominopelvic ascites without other stigmata of portal hypertension. 3.  Aortic atherosclerosis (ICD10-I70.0). Electronically Signed   By: Ulyses Jarred M.D.   On: 01/18/2019 14:26  Dg Chest Port 1 View  Result Date: 01/18/2019 CLINICAL DATA:  Shortness of breath, lower extremity swelling. EXAM: PORTABLE CHEST 1 VIEW COMPARISON:  None. FINDINGS: The heart size and mediastinal contours are within normal limits. No pneumothorax or pleural effusion is noted. Right lung is clear. Minimal left basilar subsegmental atelectasis or scarring is noted. The visualized skeletal structures are unremarkable. IMPRESSION: Minimal left basilar subsegmental atelectasis or scarring. Electronically Signed   By: Marijo Conception M.D.   On: 01/18/2019 12:46      ____________________________________________    PROCEDURES  Procedure(s) performed:    Procedures    Medications  0.9 %  sodium chloride infusion (has no administration in time range)  enoxaparin (LOVENOX) injection 40 mg (has no administration in time range)  furosemide (LASIX) injection 40 mg (40 mg Intravenous Given 01/18/19 1839)  spironolactone (ALDACTONE) tablet 25 mg (25 mg Oral Given 01/18/19 1839)  oxyCODONE (Oxy IR/ROXICODONE) immediate release tablet 5 mg (5 mg Oral Given 01/18/19 1839)  morphine 4 MG/ML injection 4 mg (4 mg Intravenous Given 01/18/19 1349)  ondansetron (ZOFRAN) injection 4 mg (4 mg Intravenous Given 01/18/19 1354)  iohexol (OMNIPAQUE) 300 MG/ML solution 100 mL (100 mLs Intravenous Contrast Given 01/18/19 1406)     ____________________________________________   INITIAL IMPRESSION / ASSESSMENT AND PLAN / ED COURSE  Pertinent labs & imaging results that were available during my care of the patient were reviewed by me and considered in my medical decision making (see chart for details).  Review of the [place] CSRS was performed in accordance of the [place] prior to dispensing any controlled drugs.         Assessment and plan Liver cirrhosis Atrial flutter Symptomatic anemia Hyponatremia 58 year old female presents to the emergency department with 3 weeks of progressive abdominal distention, shortness of breath and bilateral lower extremity swelling.  Patient reports a history of alcohol use. On physical exam, patient was tachycardic and tachypneic and appeared short of breath.  Abdomen was distended and tender to palpation.    Differential diagnosis included hepatic cirrhosis, symptomatic anemia, GI bleed. atrial flutter, CHF, COVID-19  CT abdomen and pelvis revealed evidence of hepatic cirrhosis with an intermediate amount of abdominal pelvic ascites.  EKG revealed evidence of atrial flutter and BNP was elevated at 771.  Chest x-ray reveals no  evidence of pleural effusion, opacities, infiltrates or consolidations.  COVID-19 testing was negative in the emergency department.  Patient was given 1 unit of packed red blood cells in the emergency department.  Hemoccult testing was negative for blood.  Morphine and Zofran were given for pain.  Prime doc Dr. Stark Jock was consulted who accepted patient for admission.  All patient questions were answered.    ____________________________________________  FINAL CLINICAL IMPRESSION(S) / ED DIAGNOSES  Final diagnoses:  Cirrhosis of liver with ascites, unspecified hepatic cirrhosis type ([place])  Symptomatic anemia      NEW MEDICATIONS STARTED DURING THIS VISIT:  ED Discharge Orders    None          This chart was dictated using voice recognition software/Dragon. Despite best efforts to proofread, errors can occur which can change the meaning. Any change was purely unintentional.    Lannie Fields, PA-C 01/18/19 1912    Lavonia Drafts, MD 01/19/19 [phone]

## 2019-01-18 NOTE — H&P (Signed)
[facility] [place] NAME: Anita Reed    MR#:  [id number]  DATE OF BIRTH:  05-May-1961  DATE OF ADMISSION:  01/18/2019  PRIMARY CARE PHYSICIAN: Patient, No Pcp Per   REQUESTING/REFERRING PHYSICIAN: Vallarie Mare  CHIEF COMPLAINT:   Chief Complaint  Patient presents with   Leg Swelling   Shortness of Breath    HISTORY OF PRESENT ILLNESS:  Anita Reed  is a 58 y.o. female with a known history of hypertension and remote history of lymphoma treated about 11 years ago who does not see any primary care physician who presented to the emergency room with complaints of worsening shortness of breath and lower extremity edema with abdominal distention and discomfort.  Denies any chest pain.  No fevers.  No nausea vomiting.  No diarrhea.  Patient drinks 3 glasses of wine at least 3 times weekly.  Patient was evaluated in the emergency room and found to have sodium level of 127.  Hemoglobin of 7.5.  Hemoccult test negative in the emergency room.  COVID test done negative.  CT scan of the abdomen and pelvis done with contrast reviewed hepatic cirrhosis without acute abdominal or pelvic abnormalities.  Intermediate volume abdominal pelvic ascites without other stigmata of portal hypertension.  1 unit of packed red blood cells ordered.  Medical service called to admit patient for further evaluation and management.   PAST MEDICAL HISTORY:   Past Medical History:  Diagnosis Date   Cancer ([place])    Hypertension     PAST SURGICAL HISTORY:   Past Surgical History:  Procedure Laterality Date   ABDOMINAL HYSTERECTOMY     APPENDECTOMY      SOCIAL HISTORY:   Social History   Tobacco Use   Smoking status: Current Every Day Smoker   Smokeless tobacco: Never Used  Substance Use Topics   Alcohol use: Yes  Patient drinks 3 glasses of wine 3 times weekly.  FAMILY HISTORY:  Patient lives with family.  No family history of diabetes mellitus  or coronary artery disease.  DRUG ALLERGIES:  No Known Allergies  REVIEW OF SYSTEMS:   Review of Systems  Constitutional: Negative for chills and fever.  HENT: Negative for hearing loss and tinnitus.   Eyes: Negative for blurred vision.  Respiratory: Positive for shortness of breath. Negative for cough, sputum production and wheezing.   Cardiovascular: Negative for chest pain and palpitations.  Gastrointestinal: Positive for abdominal pain. Negative for heartburn, nausea and vomiting.       Abdominal distention  Genitourinary: Negative for dysuria and urgency.  Musculoskeletal: Negative for myalgias and neck pain.  Skin: Negative for itching and rash.  Neurological: Negative for dizziness and headaches.  Psychiatric/Behavioral: Negative for depression and substance abuse.    MEDICATIONS AT HOME:   Prior to Admission medications   Not on File      VITAL SIGNS:  Blood pressure 101/70, pulse (!) 117, temperature 98.8 F (37.1 C), temperature source Oral, resp. rate (!) 21, height 5\' 9"  (1.753 m), weight 63.5 kg, SpO2 98 %.  PHYSICAL EXAMINATION:  Physical Exam  GENERAL:  58 y.o.-year-old patient lying in the bed with no acute distress.  EYES: Pupils equal, round, reactive to light and accommodation. No scleral icterus. Extraocular muscles intact.  HEENT: Head atraumatic, normocephalic. Oropharynx and nasopharynx clear.  NECK:  Supple, no jugular venous distention. No thyroid enlargement, no tenderness.  LUNGS: Normal breath sounds bilaterally, no wheezing, rales,rhonchi or crepitation. No use of  accessory muscles of respiration.  CARDIOVASCULAR: S1, S2 normal. No murmurs, rubs, or gallops.  ABDOMEN: Distended abdomen.  Mild generalized tenderness but no rebound or guarding.  Evidence of fluid thrill suggestive of underlying ascites.  Bowel sounds positive EXTREMITIES: 2+ pitting edema bilaterally.  No cyanosis, or clubbing.  NEUROLOGIC: Cranial nerves II through XII are  intact. Muscle strength 5/5 in all extremities. Sensation intact. Gait not checked.  PSYCHIATRIC: The patient is alert and oriented x 3.  SKIN: No obvious rash, lesion, or ulcer.   LABORATORY PANEL:   CBC Recent Labs  Lab 01/18/19 1220  WBC 14.4*  HGB 7.5*  HCT 24.7*  PLT 379   ------------------------------------------------------------------------------------------------------------------  Chemistries  Recent Labs  Lab 01/18/19 1220  NA 127*  K 4.5  CL 96*  CO2 19*  GLUCOSE 109*  BUN 8  CREATININE 0.49  CALCIUM 9.0  AST 85*  ALT 31  ALKPHOS 404*  BILITOT 2.0*   ------------------------------------------------------------------------------------------------------------------  Cardiac Enzymes Recent Labs  Lab 01/18/19 1220  TROPONINI <0.03   ------------------------------------------------------------------------------------------------------------------  RADIOLOGY:  Ct Abdomen Pelvis W Contrast  Result Date: 01/18/2019 CLINICAL DATA:  Lower extremity swelling EXAM: CT ABDOMEN AND PELVIS WITH CONTRAST TECHNIQUE: Multidetector CT imaging of the abdomen and pelvis was performed using the standard protocol following bolus administration of intravenous contrast. CONTRAST:  126mL OMNIPAQUE IOHEXOL 300 MG/ML  SOLN COMPARISON:  None. FINDINGS: LOWER CHEST: There is no basilar pleural or apical pericardial effusion. HEPATOBILIARY: Diffusely nodular hepatic contours with relative hypertrophy of the caudate and left hepatic lobe, consistent with hepatic cirrhosis. No focal liver lesion. No biliary dilatation. There is intermediate volume ascites throughout the abdomen. Gallbladder is distended. PANCREAS: The pancreatic parenchymal contours are normal and there is no ductal dilatation. There is no peripancreatic fluid collection. SPLEEN: Normal. ADRENALS/URINARY TRACT: --Adrenal glands: Normal. --Right kidney/ureter: No hydronephrosis, nephroureterolithiasis, perinephric stranding  or solid renal mass. --Left kidney/ureter: No hydronephrosis, nephroureterolithiasis, perinephric stranding or solid renal mass. --Urinary bladder: Normal for degree of distention STOMACH/BOWEL: --Stomach/Duodenum: There is no hiatal hernia or other gastric abnormality. The duodenal course and caliber are normal. --Small bowel: No dilatation or inflammation. --Colon: No focal abnormality. --Appendix: Not clearly visualized VASCULAR/LYMPHATIC: There is aortic atherosclerosis without hemodynamically significant stenosis. The portal vein, splenic vein, superior mesenteric vein and IVC are patent. No abdominal or pelvic lymphadenopathy. REPRODUCTIVE: Status post hysterectomy. No adnexal mass. MUSCULOSKELETAL. No bony spinal canal stenosis or focal osseous abnormality. OTHER: None. IMPRESSION: 1. Hepatic cirrhosis without acute abdominal or pelvic abnormality. 2. Intermediate volume abdominopelvic ascites without other stigmata of portal hypertension. 3.  Aortic atherosclerosis (ICD10-I70.0). Electronically Signed   By: Ulyses Jarred M.D.   On: 01/18/2019 14:26   Dg Chest Port 1 View  Result Date: 01/18/2019 CLINICAL DATA:  Shortness of breath, lower extremity swelling. EXAM: PORTABLE CHEST 1 VIEW COMPARISON:  None. FINDINGS: The heart size and mediastinal contours are within normal limits. No pneumothorax or pleural effusion is noted. Right lung is clear. Minimal left basilar subsegmental atelectasis or scarring is noted. The visualized skeletal structures are unremarkable. IMPRESSION: Minimal left basilar subsegmental atelectasis or scarring. Electronically Signed   By: Marijo Conception M.D.   On: 01/18/2019 12:46      IMPRESSION AND PLAN:  Patient is a 58 year old female with history of hypertension and remote history of lymphoma treated 11 years ago who has not seen any physician for years and has no primary care physician, admitted with findings of liver cirrhosis with intermediate volume ascites on  CT  scan.  Symptomatic with shortness of breath.  1.  Liver cirrhosis with intermediate volume ascites Newly diagnosed.  Patient symptomatic with abdominal pain and shortness of breath.  No fevers. Requested for IR paracentesis with lab studies May consider gastroenterology evaluation for liver cirrhosis possibly as outpatient No significant alcohol abuse.  Only drinks 3 glasses of wine 3 times a week.  Differential diagnoses include NAHS Patient started empirically on Lasix and spironolactone for ascites.  2.  Anemia Likely anemia of chronic disease. Being transfused with 1 unit of packed red blood cells.  Hemoccult test negative.  Requested for iron studies, U04 and folic acid level.  3.  Hypertension Blood pressure controlled with home meds with recent blood pressure 103/73.  Monitor  4.  Hyponatremia Sodium level of 127.  Likely due to liver cirrhosis with ascites Being diuresed with Lasix and spironolactone.  Follow-up on sodium level in a.m.  5.  Tobacco abuse Smokes 1 pack of cigarettes per day.  Spent 5 minutes on smoking cessation counseling. Patient declined nicotine patch  DVT prophylaxis; Lovenox  All the records are reviewed and case discussed with ED provider. Management plans discussed with the patient, and she is in agreement.  CODE STATUS: DNR/DNI Patient awake and alert and oriented.  Able to make her own medical decisions.  Clearly wishes to be DNR/DNI going forward.  TOTAL TIME TAKING CARE OF THIS PATIENT: 55 minutes.    Anita Reed M.D on 01/18/2019 at 4:58 PM  Between 7am to 6pm - Pager - [phone]  After 6pm go to [email or web address] - Proofreader  [employer]  [facility]ospital[facility]  Office  [phone]  CC: Primary care physician; Patient, No Pcp Per   Note: This dictation was prepared with Dragon dictation along with smaller phrase technology. Any transcriptional errors that result from this process are unintentional.

## 2019-01-18 NOTE — Progress Notes (Addendum)
Pt c/o of indigestion, requested medication for indigestion. NP ordered medication along with EKG and troponin. Pt refused troponin stating "that's not necessary. I think I've spent enough money tonight. It's just indigestion I ate some graham crackers and they got to me for God's sake" NP alerted of refusal via [employer] system

## 2019-01-18 NOTE — ED Triage Notes (Signed)
Says for 3 weeks gradual onset and worsening with lower extremity swelling and now abdominal swelling and shortness of breath.

## 2019-01-18 NOTE — ED Notes (Signed)
Admitting MD at bedside at this time.

## 2019-01-19 ENCOUNTER — Inpatient Hospital Stay: Payer: Medicare Other

## 2019-01-19 ENCOUNTER — Inpatient Hospital Stay (HOSPITAL_COMMUNITY)
Admit: 2019-01-19 | Discharge: 2019-01-19 | Disposition: A | Discharge status: Home / Self Care | Payer: Medicare Other | Attending: Internal Medicine | Admitting: Internal Medicine

## 2019-01-19 DIAGNOSIS — I361 Nonrheumatic tricuspid (valve) insufficiency: Secondary | ICD-10-CM

## 2019-01-19 DIAGNOSIS — K709 Alcoholic liver disease, unspecified: Secondary | ICD-10-CM | POA: Diagnosis not present

## 2019-01-19 DIAGNOSIS — R0600 Dyspnea, unspecified: Secondary | ICD-10-CM | POA: Diagnosis not present

## 2019-01-19 DIAGNOSIS — R188 Other ascites: Secondary | ICD-10-CM | POA: Diagnosis not present

## 2019-01-19 LAB — BASIC METABOLIC PANEL
Anion gap: 8 (ref 5–15)
BUN: 11 mg/dL (ref 6–20)
CO2: 23 mmol/L (ref 22–32)
Calcium: 8.6 mg/dL — ABNORMAL LOW (ref 8.9–10.3)
Chloride: 96 mmol/L — ABNORMAL LOW (ref 98–111)
Creatinine, Ser: 0.6 mg/dL (ref 0.44–1.00)
GFR calc Af Amer: >60 mL/min (ref >60)
GFR calc non Af Amer: >60 mL/min (ref >60)
Glucose, Bld: 99 mg/dL (ref 70–99)
Potassium: 3.8 mmol/L (ref 3.5–5.1)
Sodium: 127 mmol/L — ABNORMAL LOW (ref 135–145)

## 2019-01-19 LAB — BODY FLUID CELL COUNT WITH DIFFERENTIAL
Eos, Fluid: 0 %
Lymphs, Fluid: 42 %
Monocyte-Macrophage-Serous Fluid: 33 %
Neutrophil Count, Fluid: 25 %
Total Nucleated Cell Count, Fluid: 365 cu mm

## 2019-01-19 LAB — URINALYSIS, COMPLETE (UACMP) WITH MICROSCOPIC
Bacteria, UA: NONE SEEN
Bilirubin Urine: NEGATIVE
Glucose, UA: NEGATIVE mg/dL
Hgb urine dipstick: NEGATIVE
Ketones, ur: NEGATIVE mg/dL
Leukocytes,Ua: NEGATIVE
Nitrite: NEGATIVE
Protein, ur: NEGATIVE mg/dL
Specific Gravity, Urine: 1.018 (ref 1.005–1.030)
pH: 6 (ref 5.0–8.0)

## 2019-01-19 LAB — CBC
HCT: 26.8 % — ABNORMAL LOW (ref 36.0–46.0)
Hemoglobin: 8.2 g/dL — ABNORMAL LOW (ref 12.0–15.0)
MCH: 24.2 pg — ABNORMAL LOW (ref 26.0–34.0)
MCHC: 30.6 g/dL (ref 30.0–36.0)
MCV: 79.1 fL — ABNORMAL LOW (ref 80.0–100.0)
Platelets: 313 10*3/uL (ref 150–400)
RBC: 3.39 MIL/uL — ABNORMAL LOW (ref 3.87–5.11)
RDW: 17.5 % — ABNORMAL HIGH (ref 11.5–15.5)
WBC: 15.1 10*3/uL — ABNORMAL HIGH (ref 4.0–10.5)
nRBC: 0 % (ref 0.0–0.2)

## 2019-01-19 LAB — TYPE AND SCREEN
ABO/RH(D): O POS
Antibody Screen: NEGATIVE
Unit division: 0

## 2019-01-19 LAB — GLUCOSE, PLEURAL OR PERITONEAL FLUID: Glucose, Fluid: 109 mg/dL

## 2019-01-19 LAB — MAGNESIUM: Magnesium: 2 mg/dL (ref 1.7–2.4)

## 2019-01-19 LAB — BPAM RBC
Blood Product Expiration Date: 202006122359
ISSUE DATE / TIME: 202005211636
Unit Type and Rh: 5100

## 2019-01-19 LAB — PROTEIN, PLEURAL OR PERITONEAL FLUID: Total protein, fluid: <3 g/dL

## 2019-01-19 LAB — LACTATE DEHYDROGENASE, PLEURAL OR PERITONEAL FLUID: LD, Fluid: 42 U/L — ABNORMAL HIGH (ref 3–23)

## 2019-01-19 LAB — VITAMIN B12: Vitamin B-12: 711 pg/mL (ref 180–914)

## 2019-01-19 LAB — ECHOCARDIOGRAM COMPLETE
Height: 69 in
Weight: 2240 oz

## 2019-01-19 LAB — PATHOLOGIST SMEAR REVIEW

## 2019-01-19 LAB — AMYLASE, PLEURAL OR PERITONEAL FLUID: Amylase, Fluid: 15 U/L

## 2019-01-19 LAB — ALBUMIN, PLEURAL OR PERITONEAL FLUID: Albumin, Fluid: 1 g/dL

## 2019-01-19 IMAGING — US PARACENTESIS WITH ULTRASOUND GUIDANCE
1 series · 11 of 11 positions shown · non-contrast
Comparison: CT abdomen and pelvis-[DATE]

MEDICATIONS:
None.

COMPLICATIONS:
None immediate.

INDICATION: History of cirrhosis of uncertain etiology now with symptomatic
intra-abdominal ascites. Please perform ultrasound-guided
paracentesis for diagnostic and therapeutic purposes.

EXAM:
ULTRASOUND-GUIDED PARACENTESIS
TECHNIQUE: Informed written consent was obtained from the patient after a
discussion of the risks, benefits and alternatives to treatment. A
timeout was performed prior to the initiation of the procedure.

[Series 1: paracentesis with ultrasound guidance · 11 of 11 slices shown]
[im 1/11]
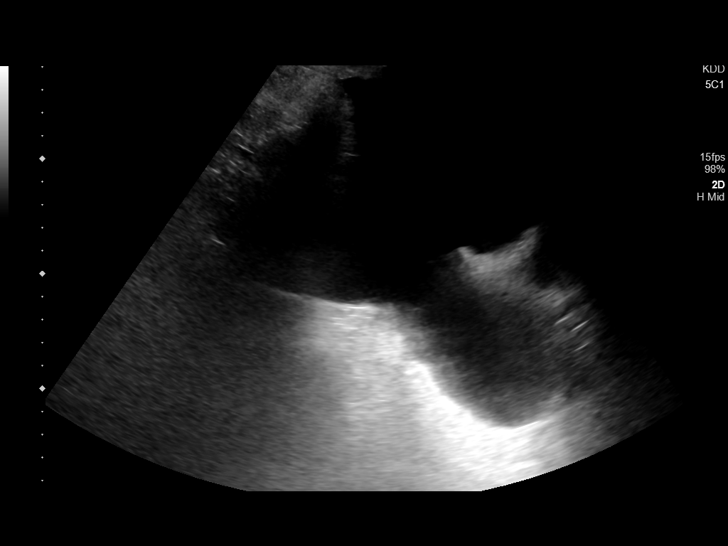
[im 2/11]
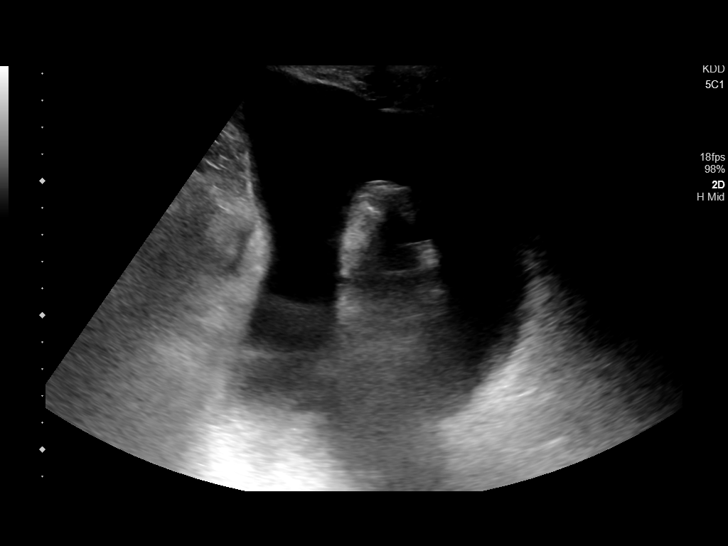
[im 3/11]
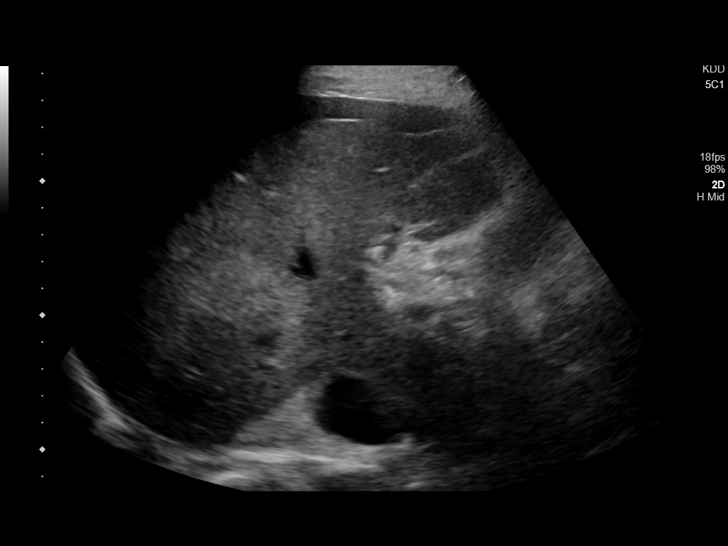
[im 4/11]
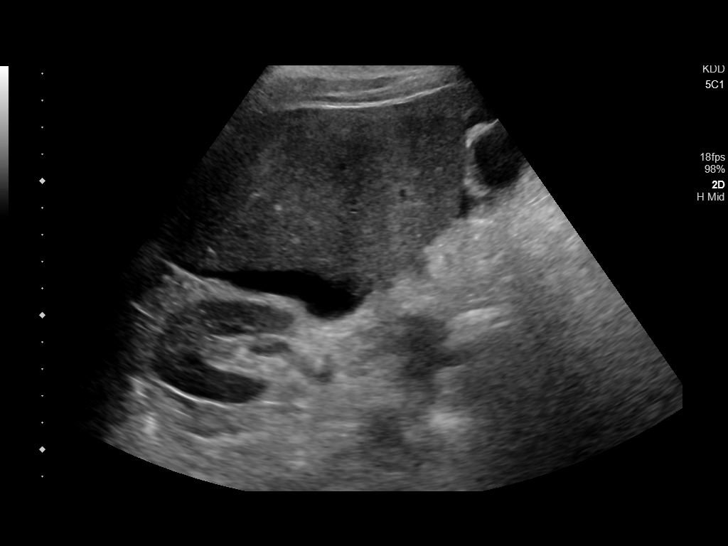
[im 5/11]
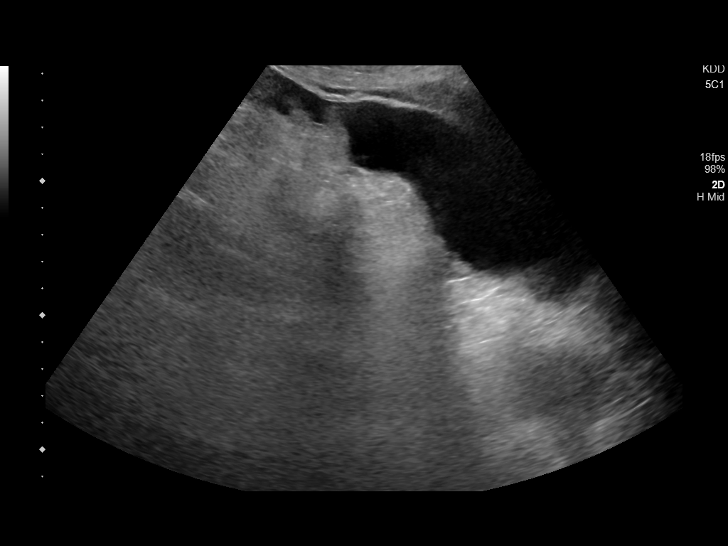
[im 6/11]
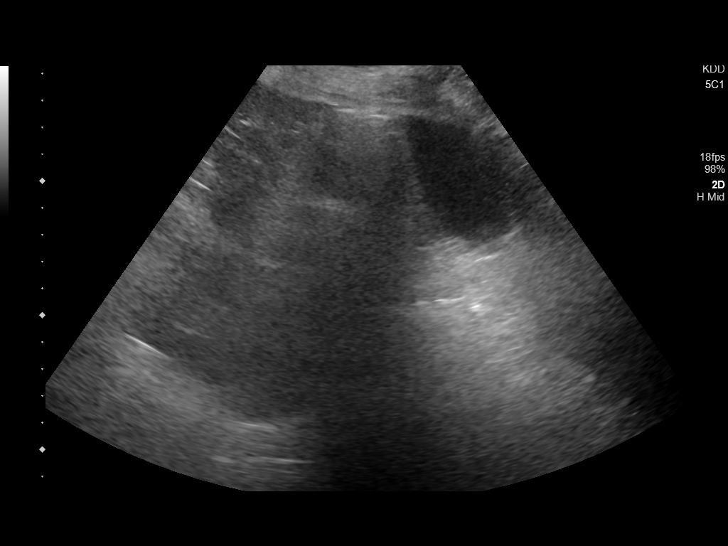
[im 7/11]
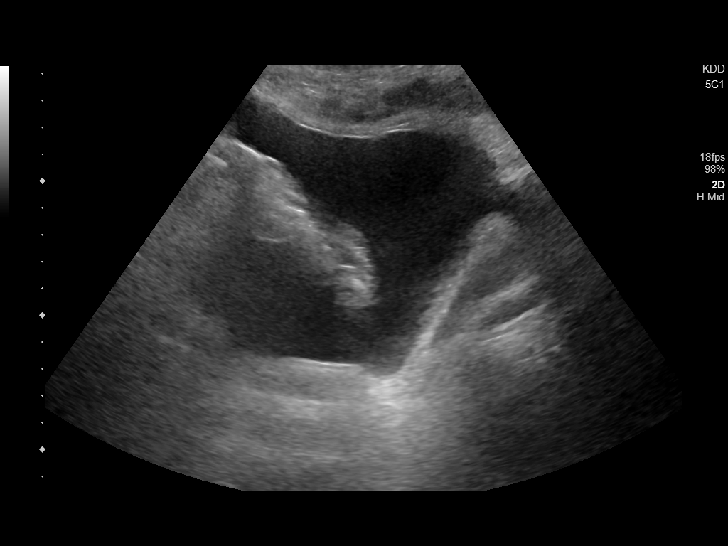
[im 8/11]
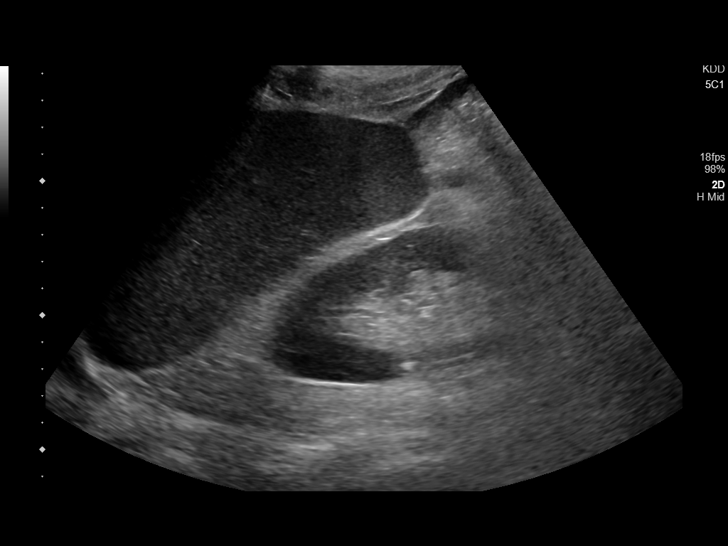
[im 9/11]
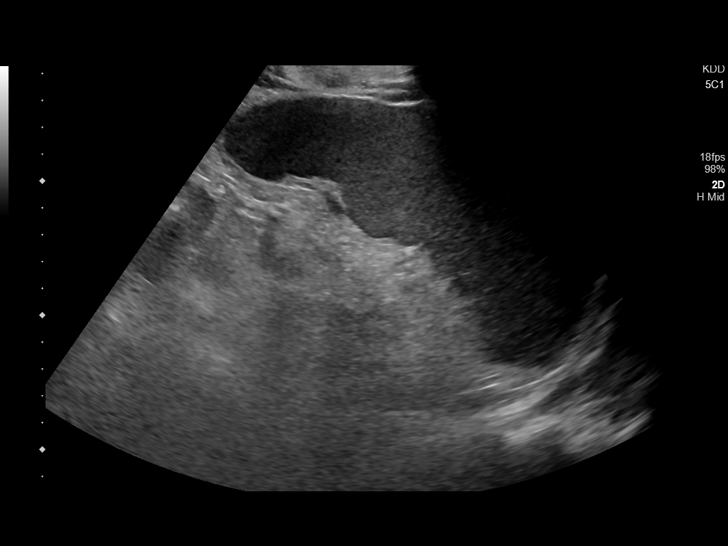
[im 10/11]
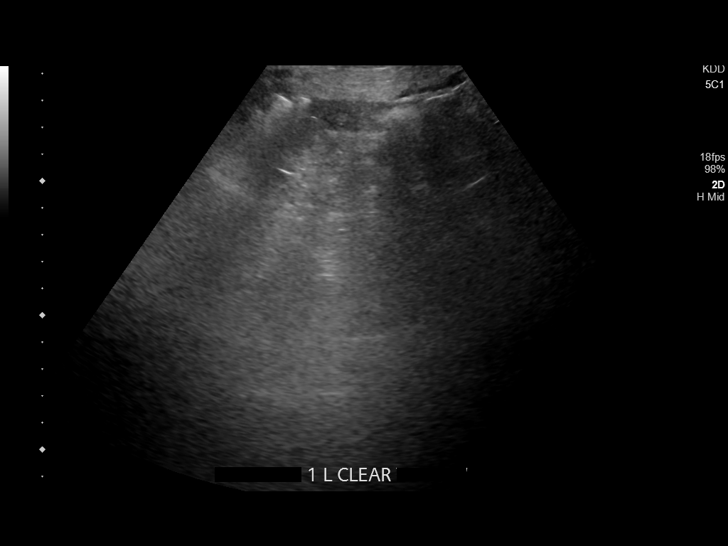
[im 11/11]
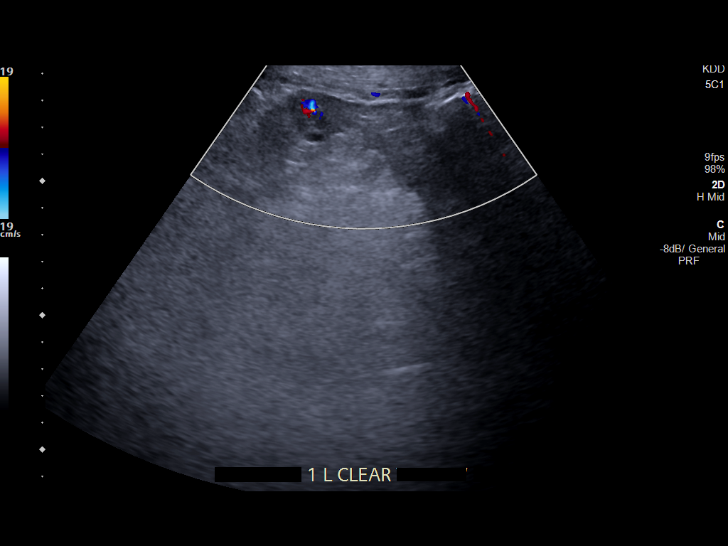

[11 of 11 positions shown; findings below may reference images not displayed]

Initial ultrasound scanning demonstrates a small to moderate amount
of ascites within the caudal aspect of the right lower abdominal
quadrant. The right lower abdomen was prepped and draped in the
usual sterile fashion. 1% lidocaine with epinephrine was used for
local anesthesia. Under direct ultrasound guidance, a 19 gauge,
7-cm, Yueh catheter was introduced. An ultrasound image was saved
for documentation purposed. The paracentesis was performed. The
catheter was removed and a dressing was applied. The patient
tolerated the procedure well without immediate post procedural
complication.
FINDINGS: A total of approximately 1 liter of serous fluid was removed.
Samples were sent to the laboratory as requested by the clinical
team.
IMPRESSION: Successful ultrasound-guided paracentesis yielding 1 liter of
peritoneal fluid.

## 2019-01-19 MED ORDER — PROPRANOLOL HCL 40 MG PO TABS: 40.0000 mg | ORAL_TABLET | Freq: Two times a day (BID) | ORAL | 1 refills | Status: DC

## 2019-01-19 MED ORDER — CARVEDILOL 3.125 MG PO TABS: 3.1250 mg | ORAL_TABLET | Freq: Two times a day (BID) | ORAL | Status: DC

## 2019-01-19 MED ORDER — SPIRONOLACTONE 50 MG PO TABS: 50.0000 mg | ORAL_TABLET | Freq: Every day | ORAL | 3 refills | Status: DC

## 2019-01-19 MED ORDER — FUROSEMIDE 40 MG PO TABS: 40.0000 mg | ORAL_TABLET | Freq: Every day | ORAL | 3 refills | Status: DC

## 2019-01-19 MED ORDER — MORPHINE SULFATE (PF) 2 MG/ML IV SOLN
2.0000 mg | INTRAVENOUS | Status: DC | PRN
  Administered 2019-01-19: 08:00:00 2 mg via INTRAVENOUS
  Filled 2019-01-19: qty 1

## 2019-01-19 MED ORDER — OXYCODONE HCL 5 MG PO TABS
5.0000 mg | ORAL_TABLET | ORAL | Status: DC | PRN
  Administered 2019-01-19: 5 mg via ORAL
  Filled 2019-01-19: qty 1

## 2019-01-19 MED ORDER — CARVEDILOL 3.125 MG PO TABS: 3.1250 mg | ORAL_TABLET | Freq: Two times a day (BID) | ORAL | 2 refills | Status: DC

## 2019-01-19 NOTE — Progress Notes (Signed)
*  PRELIMINARY RESULTS* Echocardiogram 2D Echocardiogram has been performed.  Anita Reed Isabele Lollar 01/19/2019, 2:01 PM

## 2019-01-19 NOTE — Progress Notes (Signed)
[facility] [place] NAME: Anita Reed    MR#:  [id number]  DATE OF BIRTH:  May 22, 1961  SUBJECTIVE:  CHIEF COMPLAINT:   Chief Complaint  Patient presents with   Leg Swelling   Shortness of Breath   -Patient requesting pain medicine for abdominal distention and pain.  No pedal edema noted.  Very noncompliant with medications.  REVIEW OF SYSTEMS:  Review of Systems  Constitutional: Positive for malaise/fatigue. Negative for chills and fever.  HENT: Negative for hearing loss.   Eyes: Negative for blurred vision and double vision.  Respiratory: Negative for cough, shortness of breath and wheezing.   Cardiovascular: Negative for chest pain and palpitations.  Gastrointestinal: Positive for abdominal pain. Negative for constipation, diarrhea, nausea and vomiting.  Genitourinary: Negative for dysuria.  Musculoskeletal: Negative for myalgias.  Neurological: Negative for dizziness, focal weakness, seizures, weakness and headaches.  Psychiatric/Behavioral: Negative for depression.    DRUG ALLERGIES:  No Known Allergies  VITALS:  Blood pressure 100/71, pulse (!) 118, temperature 98.2 F (36.8 C), resp. rate 16, height 5\' 9"  (1.753 m), weight 63.5 kg, SpO2 97 %.  PHYSICAL EXAMINATION:  Physical Exam   GENERAL:  58 y.o.-year-old patient lying in the bed with no acute distress.  EYES: Pupils equal, round, reactive to light and accommodation. No scleral icterus. Extraocular muscles intact.  HEENT: Head atraumatic, normocephalic. Oropharynx and nasopharynx clear.  NECK:  Supple, no jugular venous distention. No thyroid enlargement, no tenderness.  LUNGS: Normal breath sounds bilaterally, no wheezing, rales,rhonchi or crepitation. No use of accessory muscles of respiration.  Decreased bibasilar breath sounds CARDIOVASCULAR: S1, S2 rapid and irregular. No murmurs, rubs, or gallops.  ABDOMEN: Soft, nontender,  distended. Bowel sounds present.  No organomegaly or mass.  EXTREMITIES: No pedal edema, cyanosis, or clubbing.  NEUROLOGIC: Cranial nerves II through XII are intact. Muscle strength 5/5 in all extremities. Sensation intact. Gait not checked.  PSYCHIATRIC: The patient is alert and oriented x 3.  SKIN: No obvious rash, lesion, or ulcer.    LABORATORY PANEL:   CBC Recent Labs  Lab 01/19/19 0422  WBC 15.1*  HGB 8.2*  HCT 26.8*  PLT 313   ------------------------------------------------------------------------------------------------------------------  Chemistries  Recent Labs  Lab 01/18/19 1220 01/19/19 0422  NA 127* 127*  K 4.5 3.8  CL 96* 96*  CO2 19* 23  GLUCOSE 109* 99  BUN 8 11  CREATININE 0.49 0.60  CALCIUM 9.0 8.6*  MG  --  2.0  AST 85*  --   ALT 31  --   ALKPHOS 404*  --   BILITOT 2.0*  --    ------------------------------------------------------------------------------------------------------------------  Cardiac Enzymes Recent Labs  Lab 01/18/19 2250  TROPONINI <0.03   ------------------------------------------------------------------------------------------------------------------  RADIOLOGY:  Ct Abdomen Pelvis W Contrast  Result Date: 01/18/2019 CLINICAL DATA:  Lower extremity swelling EXAM: CT ABDOMEN AND PELVIS WITH CONTRAST TECHNIQUE: Multidetector CT imaging of the abdomen and pelvis was performed using the standard protocol following bolus administration of intravenous contrast. CONTRAST:  169mL OMNIPAQUE IOHEXOL 300 MG/ML  SOLN COMPARISON:  None. FINDINGS: LOWER CHEST: There is no basilar pleural or apical pericardial effusion. HEPATOBILIARY: Diffusely nodular hepatic contours with relative hypertrophy of the caudate and left hepatic lobe, consistent with hepatic cirrhosis. No focal liver lesion. No biliary dilatation. There is intermediate volume ascites throughout the abdomen. Gallbladder is distended. PANCREAS: The pancreatic parenchymal contours are normal and there is no ductal  dilatation. There is no peripancreatic fluid collection. SPLEEN:  Normal. ADRENALS/URINARY TRACT: --Adrenal glands: Normal. --Right kidney/ureter: No hydronephrosis, nephroureterolithiasis, perinephric stranding or solid renal mass. --Left kidney/ureter: No hydronephrosis, nephroureterolithiasis, perinephric stranding or solid renal mass. --Urinary bladder: Normal for degree of distention STOMACH/BOWEL: --Stomach/Duodenum: There is no hiatal hernia or other gastric abnormality. The duodenal course and caliber are normal. --Small bowel: No dilatation or inflammation. --Colon: No focal abnormality. --Appendix: Not clearly visualized VASCULAR/LYMPHATIC: There is aortic atherosclerosis without hemodynamically significant stenosis. The portal vein, splenic vein, superior mesenteric vein and IVC are patent. No abdominal or pelvic lymphadenopathy. REPRODUCTIVE: Status post hysterectomy. No adnexal mass. MUSCULOSKELETAL. No bony spinal canal stenosis or focal osseous abnormality. OTHER: None. IMPRESSION: 1. Hepatic cirrhosis without acute abdominal or pelvic abnormality. 2. Intermediate volume abdominopelvic ascites without other stigmata of portal hypertension. 3.  Aortic atherosclerosis (ICD10-I70.0). Electronically Signed   By: Ulyses Jarred M.D.   On: 01/18/2019 14:26   Dg Chest Port 1 View  Result Date: 01/18/2019 CLINICAL DATA:  Shortness of breath, lower extremity swelling. EXAM: PORTABLE CHEST 1 VIEW COMPARISON:  None. FINDINGS: The heart size and mediastinal contours are within normal limits. No pneumothorax or pleural effusion is noted. Right lung is clear. Minimal left basilar subsegmental atelectasis or scarring is noted. The visualized skeletal structures are unremarkable. IMPRESSION: Minimal left basilar subsegmental atelectasis or scarring. Electronically Signed   By: Marijo Conception M.D.   On: 01/18/2019 12:46    EKG:   Orders placed or performed during the hospital encounter of 01/18/19   ED EKG     ED EKG   EKG 12-Lead   EKG 12-Lead   EKG 12-Lead   EKG 12-Lead    ASSESSMENT AND PLAN:   58 year old female with past medical history significant for hypertension, alcohol use, history of lymphoma who is noncompliant presents to hospital secondary to abdominal distention and pain  1.  Abdominal pain and distention-CT of the abdomen showing significant ascites.  Patient likely has alcoholic liver disease. -LFTs are elevated with AST and bilirubin elevated slightly. -Counseled against alcohol use.  Going for ultrasound-guided paracentesis -We will start on Lasix and Aldactone at discharge.  2.  Lower extremity edema-secondary to cirrhosis, and anemia -Check echocardiogram to rule out cardiomyopathy. -We will start on low-dose Coreg.  Blood pressures are soft at this time.  Also on Lasix and Aldactone.  3.  Atrial flutter-patient is not symptomatic.  Start on low-dose Coreg.  Follow-up echo.  Outpatient cardiology follow-up  4.  Hyponatremia-secondary to cirrhosis and congestive heart failure  5.  Tobacco use disorder-counseled, patient refused nicotine patch  6.  Anemia-acute on chronic-no active bleeding, received 1 unit packed RBC transfusion -hemoglobin greater than 8 and stable.  Follow-up with GI as outpatient  Encourage ambulation.   All the records are reviewed and case discussed with Care Management/Social Workerr. Management plans discussed with the patient, family and they are in agreement.  CODE STATUS: DNR  TOTAL TIME TAKING CARE OF THIS PATIENT: 39 minutes.   POSSIBLE D/C IN 1-2 DAYS, DEPENDING ON CLINICAL CONDITION.   Gladstone Lighter M.D on 01/19/2019 at 1:18 PM  Between 7am to 6pm - Pager - [phone]  After 6pm go to [email or web address] - password [email or web address] [facility]  Office  [phone]  CC: Primary care physician; Patient, No Pcp Per

## 2019-01-19 NOTE — Discharge Summary (Signed)
[facility] [place] NAME: Anita Reed    MR#:  [id number]  DATE OF BIRTH:  21-Nov-1960  DATE OF ADMISSION:  01/18/2019   ADMITTING PHYSICIAN: Jude Ojie, MD  DATE OF DISCHARGE:  01/19/19  PRIMARY CARE PHYSICIAN: Patient, No Pcp Per   ADMISSION DIAGNOSIS:   Dyspnea [R06.00] Symptomatic anemia [D64.9] Cirrhosis of liver with ascites, unspecified hepatic cirrhosis type ([place]) [K74.60, R18.8]  DISCHARGE DIAGNOSIS:   Active Problems:   Ascites   SECONDARY DIAGNOSIS:   Past Medical History:  Diagnosis Date  . Cancer ([place])   . Hypertension     HOSPITAL COURSE:   58 year old female with past medical history significant for hypertension, alcohol use, history of lymphoma who is noncompliant presents to hospital secondary to abdominal distention and pain  1.  Abdominal pain and distention-CT of the abdomen showing significant ascites.  Patient likely has alcoholic liver disease. -LFTs are elevated with AST and bilirubin elevated slightly. -Counseled against alcohol use.  s/p ultrasound-guided paracentesis and 1 liter ascitic fluid removed. Patient feels very well and improved symptoms after the tap and very anxious and wants to be discharged ASAP. -now started on Lasix and Aldactone at discharge.  2.  Lower extremity edema-secondary to cirrhosis, and anemia -f/u echocardiogram to rule out cardiomyopathy. ECHO done- results pending- but patient very eager to be discharged Outpatient f/u with cardiology will be set up -started on low-dose Coreg.  Blood pressures are soft at this time.  Also on Lasix and Aldactone.  3.  Atrial flutter-patient is not symptomatic.  Started on low-dose Coreg.  Follow-up echo.  Outpatient cardiology follow-up  4.  Hyponatremia-secondary to cirrhosis and congestive heart failure  5.  Tobacco use disorder-counseled, patient refused nicotine patch  6.  Anemia-acute on chronic-no active bleeding,  received 1 unit packed RBC transfusion -hemoglobin greater than 8 and stable.  Follow-up with GI as outpatient  Patient wants to be discharged home.  Not prescribing any pain medications at discharge.  Encourage ambulation. High risk for re-admission  DISCHARGE CONDITIONS:   Guarded  CONSULTS OBTAINED:   None  DRUG ALLERGIES:   No Known Allergies DISCHARGE MEDICATIONS:   Allergies as of 01/19/2019   No Known Allergies     Medication List    TAKE these medications   carvedilol 3.125 MG tablet Commonly known as:  COREG Take 1 tablet (3.125 mg total) by mouth 2 (two) times daily with a meal.   furosemide 40 MG tablet Commonly known as:  Lasix Take 1 tablet (40 mg total) by mouth daily.   spironolactone 50 MG tablet Commonly known as:  Aldactone Take 1 tablet (50 mg total) by mouth daily.        DISCHARGE INSTRUCTIONS:   1. GI f/u in 1-2 weeks 2. Cardiology f/u in 1-2 weeks  DIET:   Cardiac diet  ACTIVITY:   Activity as tolerated  OXYGEN:   Home Oxygen: No.  Oxygen Delivery: room air  DISCHARGE LOCATION:   home   If you experience worsening of your admission symptoms, develop shortness of breath, life threatening emergency, suicidal or homicidal thoughts you must seek medical attention immediately by calling 911 or calling your MD immediately  if symptoms less severe.  You Must read complete instructions/literature along with all the possible adverse reactions/side effects for all the Medicines you take and that have been prescribed to you. Take any new Medicines after you have completely understood and accpet all the possible adverse  reactions/side effects.   Please note  You were cared for by a hospitalist during your hospital stay. If you have any questions about your discharge medications or the care you received while you were in the hospital after you are discharged, you can call the unit and asked to speak with the hospitalist on call if the  hospitalist that took care of you is not available. Once you are discharged, your primary care physician will handle any further medical issues. Please note that NO REFILLS for any discharge medications will be authorized once you are discharged, as it is imperative that you return to your primary care physician (or establish a relationship with a primary care physician if you do not have one) for your aftercare needs so that they can reassess your need for medications and monitor your lab values.    On the day of Discharge:  VITAL SIGNS:   Blood pressure 100/67, pulse (!) 121, temperature 98.2 F (36.8 C), resp. rate 16, height 5\' 9"  (1.753 m), weight 63.5 kg, SpO2 90 %.  PHYSICAL EXAMINATION:     GENERAL:  58 y.o.-year-old patient lying in the bed with no acute distress.  EYES: Pupils equal, round, reactive to light and accommodation. No scleral icterus. Extraocular muscles intact.  HEENT: Head atraumatic, normocephalic. Oropharynx and nasopharynx clear.  NECK:  Supple, no jugular venous distention. No thyroid enlargement, no tenderness.  LUNGS: Normal breath sounds bilaterally, no wheezing, rales,rhonchi or crepitation. No use of accessory muscles of respiration.  Decreased bibasilar breath sounds CARDIOVASCULAR: S1, S2 rapid and irregular. No murmurs, rubs, or gallops.  ABDOMEN: Soft, nontender,  distended. Bowel sounds present. No organomegaly or mass.  EXTREMITIES: No pedal edema, cyanosis, or clubbing.  NEUROLOGIC: Cranial nerves II through XII are intact. Muscle strength 5/5 in all extremities. Sensation intact. Gait not checked.  PSYCHIATRIC: The patient is alert and oriented x 3.  SKIN: No obvious rash, lesion, or ulcer.   DATA REVIEW:   CBC Recent Labs  Lab 01/19/19 0422  WBC 15.1*  HGB 8.2*  HCT 26.8*  PLT 313    Chemistries  Recent Labs  Lab 01/18/19 1220 01/19/19 0422  NA 127* 127*  K 4.5 3.8  CL 96* 96*  CO2 19* 23  GLUCOSE 109* 99  BUN 8 11  CREATININE  0.49 0.60  CALCIUM 9.0 8.6*  MG  --  2.0  AST 85*  --   ALT 31  --   ALKPHOS 404*  --   BILITOT 2.0*  --      Microbiology Results  Results for orders placed or performed during the hospital encounter of 01/18/19  SARS Coronavirus 2 (CEPHEID - Performed in [facility] hospital lab), Hosp Order     Status: None   Collection Time: 01/18/19 12:20 PM  Result Value Ref Range Status   SARS Coronavirus 2 NEGATIVE NEGATIVE Final    Comment: (NOTE) If result is NEGATIVE SARS-CoV-2 target nucleic acids are NOT DETECTED. The SARS-CoV-2 RNA is generally detectable in upper and lower  respiratory specimens during the acute phase of infection. The lowest  concentration of SARS-CoV-2 viral copies this assay can detect is 250  copies / mL. A negative result does not preclude SARS-CoV-2 infection  and should not be used as the sole basis for treatment or other  patient management decisions.  A negative result may occur with  improper specimen collection / handling, submission of specimen other  than nasopharyngeal swab, presence of viral mutation(s) within the  areas targeted by this assay, and inadequate number of viral copies  (<250 copies / mL). A negative result must be combined with clinical  observations, patient history, and epidemiological information. If result is POSITIVE SARS-CoV-2 target nucleic acids are DETECTED. The SARS-CoV-2 RNA is generally detectable in upper and lower  respiratory specimens dur ing the acute phase of infection.  Positive  results are indicative of active infection with SARS-CoV-2.  Clinical  correlation with patient history and other diagnostic information is  necessary to determine patient infection status.  Positive results do  not rule out bacterial infection or co-infection with other viruses. If result is PRESUMPTIVE POSTIVE SARS-CoV-2 nucleic acids MAY BE PRESENT.   A presumptive positive result was obtained on the submitted specimen  and confirmed  on repeat testing.  While 2019 novel coronavirus  (SARS-CoV-2) nucleic acids may be present in the submitted sample  additional confirmatory testing may be necessary for epidemiological  and / or clinical management purposes  to differentiate between  SARS-CoV-2 and other Sarbecovirus currently known to infect humans.  If clinically indicated additional testing with an alternate test  methodology [phone]) is advised. The SARS-CoV-2 RNA is generally  detectable in upper and lower respiratory sp ecimens during the acute  phase of infection. The expected result is Negative. Fact Sheet for Patients:  StrictlyIdeas.no Fact Sheet for Healthcare Providers: BankingDealers.co.za This test is not yet approved or cleared by the [place] FDA and has been authorized for detection and/or diagnosis of SARS-CoV-2 by FDA under an Emergency Use Authorization (EUA).  This EUA will remain in effect (meaning this test can be used) for the duration of the COVID-19 declaration under Section 564(b)(1) of the Act, 21 U.S.C. section 360bbb-3(b)(1), unless the authorization is terminated or revoked sooner. Performed at [facility], [address] [place], [place] [address]     RADIOLOGY:  No results found.   Management plans discussed with the patient, family and they are in agreement.  CODE STATUS:     Code Status Orders  (From admission, onward)         Start     Ordered   01/18/19 1650  Do not attempt resuscitation (DNR)  Continuous    Question Answer Comment  In the event of cardiac or respiratory ARREST Do not call a "code blue"   In the event of cardiac or respiratory ARREST Do not perform Intubation, CPR, defibrillation or ACLS   In the event of cardiac or respiratory ARREST Use medication by any route, position, wound care, and other measures to relive pain and suffering. May use oxygen, suction and manual treatment of airway  obstruction as needed for comfort.   Comments Patient awake and alert and able to make her own medical decisions.  Patient clearly wishes to be DNR/DNI      01/18/19 1651        Code Status History    This patient has a current code status but no historical code status.      TOTAL TIME TAKING CARE OF THIS PATIENT: 38 minutes.    Gladstone Lighter M.D on 01/19/2019 at 3:40 PM  Between 7am to 6pm - Pager - [phone]  After 6pm go to [email or web address] - Proofreader  [facility]  Office  [phone]  CC: Primary care physician; Patient, No Pcp Per   Note: This dictation was prepared with Dragon dictation along with smaller phrase technology. Any transcriptional errors that result from this process are unintentional.

## 2019-01-19 NOTE — Procedures (Signed)
Pre Procedural Dx: Symptomatic Ascites Post Procedural Dx: Same  Successful US guided paracentesis yielding 1 L of serous ascitic fluid. Sample sent to laboratory as requested.  EBL: None  Complications: None immediate  [name], MD Pager #: [phone]

## 2019-01-19 NOTE — Progress Notes (Signed)
Discharge summary reviewed with verbal understanding. Answered all questions. Awaiting ride to arrive.

## 2019-01-20 LAB — PROTEIN, BODY FLUID (OTHER): Total Protein, Body Fluid Other: 1.7 g/dL

## 2019-01-20 LAB — HEPATITIS PANEL, ACUTE
HCV Ab: >11 s/co ratio — ABNORMAL HIGH (ref 0.0–0.9)
Hep A IgM: NEGATIVE
Hep B C IgM: NEGATIVE
Hepatitis B Surface Ag: NEGATIVE

## 2019-01-20 LAB — HIV ANTIBODY (ROUTINE TESTING W REFLEX): HIV Screen 4th Generation wRfx: NONREACTIVE

## 2019-01-23 LAB — BODY FLUID CULTURE: Culture: NO GROWTH

## 2019-01-26 LAB — LIPASE, FLUID: Lipase-Fluid: 7 U/L

## 2019-01-28 ENCOUNTER — Inpatient Hospital Stay
Admission: EM | Admit: 2019-01-28 | Discharge: 2019-01-31 | DRG: 378 | Disposition: A | Discharge status: Home / Self Care | Payer: Medicare Other | Attending: Internal Medicine | Admitting: Internal Medicine

## 2019-01-28 ENCOUNTER — Emergency Department: Payer: Medicare Other

## 2019-01-28 ENCOUNTER — Other Ambulatory Visit: Payer: Self-pay

## 2019-01-28 ENCOUNTER — Encounter: Payer: Self-pay | Admitting: Emergency Medicine

## 2019-01-28 DIAGNOSIS — Z79899 Other long term (current) drug therapy: Secondary | ICD-10-CM

## 2019-01-28 DIAGNOSIS — K567 Ileus, unspecified: Secondary | ICD-10-CM | POA: Diagnosis not present

## 2019-01-28 DIAGNOSIS — I4891 Unspecified atrial fibrillation: Secondary | ICD-10-CM | POA: Diagnosis not present

## 2019-01-28 DIAGNOSIS — I480 Paroxysmal atrial fibrillation: Secondary | ICD-10-CM | POA: Diagnosis not present

## 2019-01-28 DIAGNOSIS — J811 Chronic pulmonary edema: Secondary | ICD-10-CM | POA: Diagnosis not present

## 2019-01-28 DIAGNOSIS — J90 Pleural effusion, not elsewhere classified: Secondary | ICD-10-CM | POA: Diagnosis not present

## 2019-01-28 DIAGNOSIS — E871 Hypo-osmolality and hyponatremia: Secondary | ICD-10-CM | POA: Diagnosis present

## 2019-01-28 DIAGNOSIS — R0602 Shortness of breath: Secondary | ICD-10-CM

## 2019-01-28 DIAGNOSIS — E860 Dehydration: Secondary | ICD-10-CM | POA: Diagnosis present

## 2019-01-28 DIAGNOSIS — K922 Gastrointestinal hemorrhage, unspecified: Secondary | ICD-10-CM | POA: Diagnosis present

## 2019-01-28 DIAGNOSIS — K766 Portal hypertension: Secondary | ICD-10-CM | POA: Diagnosis not present

## 2019-01-28 DIAGNOSIS — K449 Diaphragmatic hernia without obstruction or gangrene: Secondary | ICD-10-CM | POA: Diagnosis present

## 2019-01-28 DIAGNOSIS — F17210 Nicotine dependence, cigarettes, uncomplicated: Secondary | ICD-10-CM | POA: Diagnosis present

## 2019-01-28 DIAGNOSIS — Z8572 Personal history of non-Hodgkin lymphomas: Secondary | ICD-10-CM | POA: Diagnosis not present

## 2019-01-28 DIAGNOSIS — R008 Other abnormalities of heart beat: Secondary | ICD-10-CM | POA: Diagnosis not present

## 2019-01-28 DIAGNOSIS — I8510 Secondary esophageal varices without bleeding: Secondary | ICD-10-CM | POA: Diagnosis present

## 2019-01-28 DIAGNOSIS — I5032 Chronic diastolic (congestive) heart failure: Secondary | ICD-10-CM | POA: Diagnosis present

## 2019-01-28 DIAGNOSIS — Z716 Tobacco abuse counseling: Secondary | ICD-10-CM

## 2019-01-28 DIAGNOSIS — Z1159 Encounter for screening for other viral diseases: Secondary | ICD-10-CM | POA: Diagnosis not present

## 2019-01-28 DIAGNOSIS — K3182 Dieulafoy lesion (hemorrhagic) of stomach and duodenum: Principal | ICD-10-CM | POA: Diagnosis present

## 2019-01-28 DIAGNOSIS — K3189 Other diseases of stomach and duodenum: Secondary | ICD-10-CM | POA: Diagnosis not present

## 2019-01-28 DIAGNOSIS — E876 Hypokalemia: Secondary | ICD-10-CM | POA: Diagnosis present

## 2019-01-28 DIAGNOSIS — Z90710 Acquired absence of both cervix and uterus: Secondary | ICD-10-CM

## 2019-01-28 DIAGNOSIS — B1920 Unspecified viral hepatitis C without hepatic coma: Secondary | ICD-10-CM | POA: Diagnosis present

## 2019-01-28 DIAGNOSIS — Z8249 Family history of ischemic heart disease and other diseases of the circulatory system: Secondary | ICD-10-CM

## 2019-01-28 DIAGNOSIS — R531 Weakness: Secondary | ICD-10-CM

## 2019-01-28 DIAGNOSIS — I4892 Unspecified atrial flutter: Secondary | ICD-10-CM | POA: Diagnosis present

## 2019-01-28 DIAGNOSIS — I959 Hypotension, unspecified: Secondary | ICD-10-CM | POA: Diagnosis present

## 2019-01-28 DIAGNOSIS — I110 Hypertensive heart disease with heart failure: Secondary | ICD-10-CM | POA: Diagnosis present

## 2019-01-28 DIAGNOSIS — K7031 Alcoholic cirrhosis of liver with ascites: Secondary | ICD-10-CM | POA: Diagnosis present

## 2019-01-28 DIAGNOSIS — R062 Wheezing: Secondary | ICD-10-CM

## 2019-01-28 DIAGNOSIS — D649 Anemia, unspecified: Secondary | ICD-10-CM | POA: Diagnosis not present

## 2019-01-28 DIAGNOSIS — D62 Acute posthemorrhagic anemia: Secondary | ICD-10-CM | POA: Diagnosis not present

## 2019-01-28 DIAGNOSIS — N179 Acute kidney failure, unspecified: Secondary | ICD-10-CM | POA: Diagnosis not present

## 2019-01-28 DIAGNOSIS — F1010 Alcohol abuse, uncomplicated: Secondary | ICD-10-CM | POA: Diagnosis present

## 2019-01-28 DIAGNOSIS — I8511 Secondary esophageal varices with bleeding: Secondary | ICD-10-CM | POA: Diagnosis not present

## 2019-01-28 DIAGNOSIS — I483 Typical atrial flutter: Secondary | ICD-10-CM | POA: Diagnosis not present

## 2019-01-28 DIAGNOSIS — K921 Melena: Secondary | ICD-10-CM | POA: Diagnosis not present

## 2019-01-28 LAB — BLOOD GAS, ARTERIAL
Acid-base deficit: 7.6 mmol/L — ABNORMAL HIGH (ref 0.0–2.0)
Bicarbonate: 14.9 mmol/L — ABNORMAL LOW (ref 20.0–28.0)
FIO2: 0.28
O2 Saturation: 98.2 %
Patient temperature: 37
pCO2 arterial: 23 mmHg — ABNORMAL LOW (ref 32.0–48.0)
pH, Arterial: 7.42 (ref 7.350–7.450)
pO2, Arterial: 107 mmHg (ref 83.0–108.0)

## 2019-01-28 LAB — CBC
HCT: 13 % — CL (ref 36.0–46.0)
Hemoglobin: 4 g/dL — CL (ref 12.0–15.0)
MCH: 24.4 pg — ABNORMAL LOW (ref 26.0–34.0)
MCHC: 30.8 g/dL (ref 30.0–36.0)
MCV: 79.3 fL — ABNORMAL LOW (ref 80.0–100.0)
Platelets: 288 10*3/uL (ref 150–400)
RBC: 1.64 MIL/uL — ABNORMAL LOW (ref 3.87–5.11)
RDW: 20.1 % — ABNORMAL HIGH (ref 11.5–15.5)
WBC: 25.8 10*3/uL — ABNORMAL HIGH (ref 4.0–10.5)
nRBC: 0.1 % (ref 0.0–0.2)

## 2019-01-28 LAB — BASIC METABOLIC PANEL
Anion gap: 19 — ABNORMAL HIGH (ref 5–15)
BUN: 94 mg/dL — ABNORMAL HIGH (ref 6–20)
CO2: 15 mmol/L — ABNORMAL LOW (ref 22–32)
Calcium: 8.6 mg/dL — ABNORMAL LOW (ref 8.9–10.3)
Chloride: 94 mmol/L — ABNORMAL LOW (ref 98–111)
Creatinine, Ser: 1.62 mg/dL — ABNORMAL HIGH (ref 0.44–1.00)
GFR calc Af Amer: 40 mL/min — ABNORMAL LOW (ref >60)
GFR calc non Af Amer: 35 mL/min — ABNORMAL LOW (ref >60)
Glucose, Bld: 114 mg/dL — ABNORMAL HIGH (ref 70–99)
Potassium: 4.3 mmol/L (ref 3.5–5.1)
Sodium: 128 mmol/L — ABNORMAL LOW (ref 135–145)

## 2019-01-28 LAB — LIPASE, BLOOD: Lipase: 71 U/L — ABNORMAL HIGH (ref 11–51)

## 2019-01-28 LAB — HEPATIC FUNCTION PANEL
ALT: 30 U/L (ref 0–44)
AST: 69 U/L — ABNORMAL HIGH (ref 15–41)
Albumin: 3.3 g/dL — ABNORMAL LOW (ref 3.5–5.0)
Alkaline Phosphatase: 196 U/L — ABNORMAL HIGH (ref 38–126)
Bilirubin, Direct: 1.2 mg/dL — ABNORMAL HIGH (ref 0.0–0.2)
Indirect Bilirubin: 1.1 mg/dL — ABNORMAL HIGH (ref 0.3–0.9)
Total Bilirubin: 2.3 mg/dL — ABNORMAL HIGH (ref 0.3–1.2)
Total Protein: 5.6 g/dL — ABNORMAL LOW (ref 6.5–8.1)

## 2019-01-28 LAB — AMMONIA: Ammonia: 10 umol/L (ref 9–35)

## 2019-01-28 LAB — TROPONIN I: Troponin I: <0.03 ng/mL (ref <0.03)

## 2019-01-28 LAB — SARS CORONAVIRUS 2 BY RT PCR (HOSPITAL ORDER, PERFORMED IN ~~LOC~~ HOSPITAL LAB): SARS Coronavirus 2: NEGATIVE

## 2019-01-28 IMAGING — DX PORTABLE CHEST - 1 VIEW
1 series · 1 of 1 positions shown · non-contrast
Comparison: [DATE]

CLINICAL DATA: Shortness of breath

EXAM:
PORTABLE CHEST 1 VIEW

[chest ap]
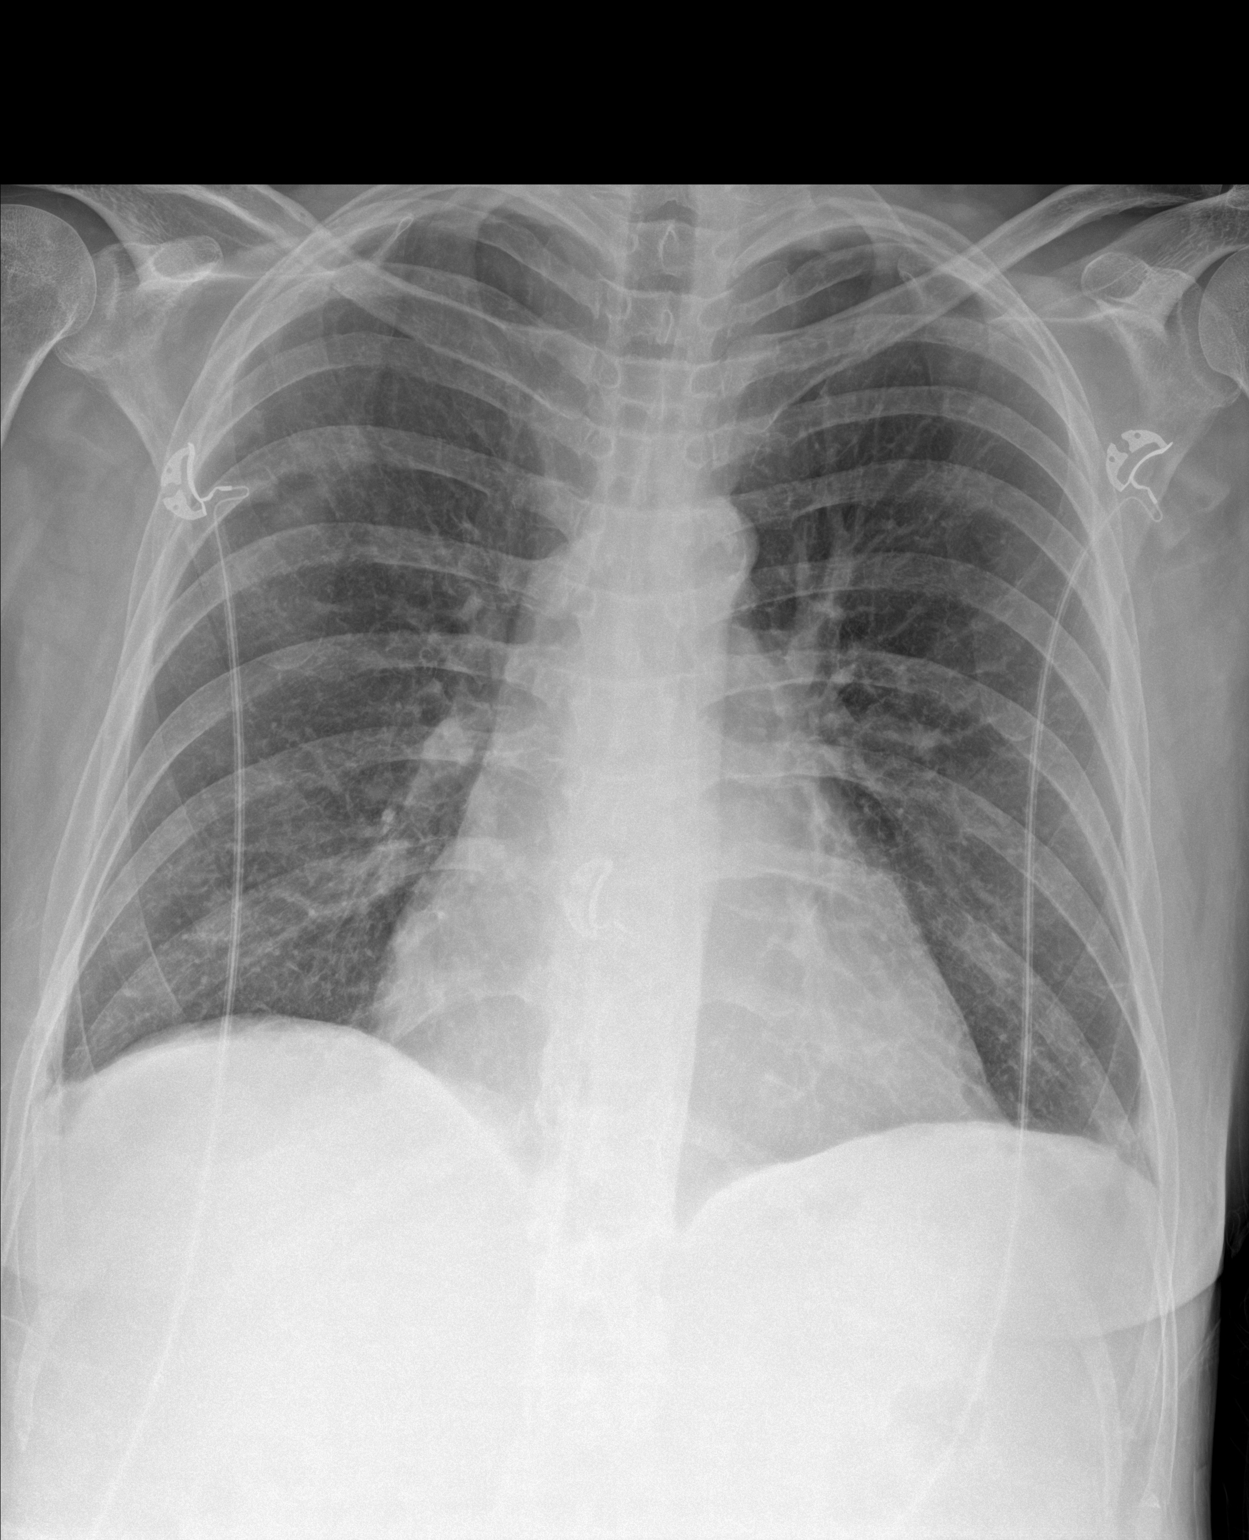

[1 of 1 positions shown; findings below may reference images not displayed]

FINDINGS: Mild cardiomegaly. Lungs clear. No effusions or edema. No acute bony
abnormality.
IMPRESSION: Cardiomegaly.  No active disease.

## 2019-01-28 MED ORDER — SODIUM CHLORIDE 0.9 % IV SOLN
80.0000 mg | Freq: Once | INTRAVENOUS | Status: AC
  Administered 2019-01-28: 80 mg via INTRAVENOUS
  Filled 2019-01-28: qty 80

## 2019-01-28 MED ORDER — SODIUM CHLORIDE 0.9 % IV SOLN
1.0000 g | INTRAVENOUS | Status: DC
  Administered 2019-01-29 – 2019-01-30 (×3): 1 g via INTRAVENOUS
  Filled 2019-01-28: qty 1
  Filled 2019-01-28 (×2): qty 10
  Filled 2019-01-28: qty 1

## 2019-01-28 MED ORDER — VITAMIN B-1 100 MG PO TABS
100.0000 mg | ORAL_TABLET | Freq: Every day | ORAL | Status: DC
  Administered 2019-01-28 – 2019-01-31 (×3): 100 mg via ORAL
  Filled 2019-01-28 (×3): qty 1

## 2019-01-28 MED ORDER — FOLIC ACID 1 MG PO TABS
1.0000 mg | ORAL_TABLET | Freq: Every day | ORAL | Status: DC
  Administered 2019-01-28 – 2019-01-31 (×3): 1 mg via ORAL
  Filled 2019-01-28 (×3): qty 1

## 2019-01-28 MED ORDER — SODIUM CHLORIDE 0.9 % IV SOLN
8.0000 mg/h | INTRAVENOUS | Status: DC
  Administered 2019-01-28 – 2019-01-29 (×3): 8 mg/h via INTRAVENOUS
  Filled 2019-01-28 (×4): qty 80

## 2019-01-28 MED ORDER — LORAZEPAM 2 MG/ML IJ SOLN
2.0000 mg | INTRAMUSCULAR | Status: DC | PRN
  Administered 2019-01-28 – 2019-01-31 (×15): 2 mg via INTRAVENOUS
  Filled 2019-01-28 (×15): qty 1

## 2019-01-28 MED ORDER — SODIUM CHLORIDE 0.9 % IV SOLN
10.0000 mL/h | Freq: Once | INTRAVENOUS | Status: DC
Start: 2019-01-28 — End: 2019-01-29

## 2019-01-28 MED ORDER — SODIUM CHLORIDE 0.9 % IV SOLN
INTRAVENOUS | Status: DC
  Administered 2019-01-28 – 2019-01-29 (×3): via INTRAVENOUS

## 2019-01-28 MED ORDER — SODIUM CHLORIDE 0.9 % IV SOLN
50.0000 ug/h | INTRAVENOUS | Status: DC
  Administered 2019-01-28 – 2019-01-30 (×4): 50 ug/h via INTRAVENOUS
  Filled 2019-01-28 (×9): qty 1

## 2019-01-28 MED ORDER — LORAZEPAM 2 MG/ML IJ SOLN
1.0000 mg | Freq: Once | INTRAMUSCULAR | Status: AC
  Administered 2019-01-28: 1 mg via INTRAVENOUS
  Filled 2019-01-28: qty 1

## 2019-01-28 MED ORDER — SODIUM CHLORIDE 0.9% FLUSH
3.0000 mL | Freq: Two times a day (BID) | INTRAVENOUS | Status: DC
  Administered 2019-01-28 – 2019-01-31 (×6): 3 mL via INTRAVENOUS

## 2019-01-28 MED ORDER — SODIUM CHLORIDE 0.9 % IV SOLN: 10.0000 mL/h | Freq: Once | INTRAVENOUS | Status: DC

## 2019-01-28 MED ORDER — ADULT MULTIVITAMIN W/MINERALS CH
1.0000 | ORAL_TABLET | Freq: Every day | ORAL | Status: DC
  Administered 2019-01-28 – 2019-01-31 (×3): 1 via ORAL
  Filled 2019-01-28 (×3): qty 1

## 2019-01-28 MED ORDER — SODIUM CHLORIDE 0.9 % IV BOLUS
1000.0000 mL | Freq: Once | INTRAVENOUS | Status: AC
  Administered 2019-01-28: 1000 mL via INTRAVENOUS

## 2019-01-28 NOTE — ED Notes (Signed)
Report called to floor.  Given to [place] [facility].

## 2019-01-28 NOTE — ED Provider Notes (Addendum)
[facility] Emergency Department Provider Note  Time seen: 5:59 PM  I have reviewed the triage vital signs and the nursing notes.   HISTORY  Chief Complaint Weakness and Shortness of Breath    HPI Anita Reed is a 58 y.o. female with a past medical history of cirrhosis, presents to the emergency department  for weakness chills and shortness of breath.  She also states she has noticed black stool.  Patient has a large distended abdomen consistent with ascites patient states it feels fairly normal for her does not feel overly distended.  Denies abdominal pain.  No known fever.  Patient does seem confused at times, groaning and moaning at times.  Patient is awake and alert able to answer questions apparently appropriately.  Patient found to be hypotensive 87/58 upon arrival.  Past Medical History:  Diagnosis Date  . Cancer ([place])   . Hypertension     Patient Active Problem List   Diagnosis Date Noted  . Ascites 01/18/2019  . Cancer [facility])     Past Surgical History:  Procedure Laterality Date  . ABDOMINAL HYSTERECTOMY    . APPENDECTOMY      Prior to Admission medications   Medication Sig Start Date End Date Taking? Authorizing Provider  carvedilol (COREG) 3.125 MG tablet Take 1 tablet (3.125 mg total) by mouth 2 (two) times daily with a meal. 01/19/19   Gladstone Lighter, MD  furosemide (LASIX) 40 MG tablet Take 1 tablet (40 mg total) by mouth daily. 01/19/19 05/19/19  Gladstone Lighter, MD  spironolactone (ALDACTONE) 50 MG tablet Take 1 tablet (50 mg total) by mouth daily. 01/19/19 05/19/19  Gladstone Lighter, MD    No Known Allergies  No family history on file.  Social History Social History   Tobacco Use  . Smoking status: Current Every Day Smoker  . Smokeless tobacco: Never Used  Substance Use Topics  . Alcohol use: Yes  . Drug use: Not on file    Review of Systems Constitutional: Negative for fever. ENT: Negative for recent  illness/congestion Cardiovascular: Negative for chest pain. Respiratory: Positive for shortness of breath, worse with exertion Gastrointestinal: Abdominal distention which the patient states is chronic.  Denies abdominal pain.  Denies any vomiting.  Positive for black stool. Genitourinary: Negative for urinary compaints Musculoskeletal: States her legs were swollen last week when she was admitted to the hospital but are much better now. Skin: Negative for skin complaints  Neurological: Negative for headache All other ROS negative  ____________________________________________   PHYSICAL EXAM:  VITAL SIGNS: ED Triage Vitals [01/28/19 1749]  Enc Vitals Group     BP (!) 87/58     Pulse Rate (!) 112     Resp (!) 22     Temp 98.4 F (36.9 C)     Temp Source Oral     SpO2 94 %     Weight      Height      Head Circumference      Peak Flow      Pain Score      Pain Loc      Pain Edu?      Excl. in [place]?     Constitutional: Patient is awake and alert, answering questions appropriately but appears restless. Eyes: Mild scleral icterus. ENT      Head: Normocephalic and atraumatic.       Mouth/Throat: Mucous membranes are moist. Cardiovascular: Normal rate, regular rhythm around 100 bpm. Respiratory: Normal respiratory effort without tachypnea nor  retractions. Breath sounds are clear.  No obvious wheeze rales or rhonchi. Gastrointestinal: Soft abdomen with moderate distention, dull percussion but nontender consistent with ascites. Musculoskeletal: Nontender with normal range of motion in all extremities.  Neurologic:  Normal speech and language. No gross focal neurologic deficits  Skin:  Skin is warm, dry and intact.  Psychiatric: Mood and affect are normal.    RADIOLOGY  Chest x-ray does not appear to show any acute abnormalities on my review.  ____________________________________________   INITIAL IMPRESSION / ASSESSMENT AND PLAN / ED COURSE  Pertinent labs & imaging  results that were available during my care of the patient were reviewed by me and considered in my medical decision making (see chart for details).   Patient presents to the emergency department for generalized weakness, shortness of breath, black stool.  Differential would include metabolic electrolyte abnormality, pneumonia, pneumothorax, pulmonary edema, ACS, pleural effusions, anemia, GI bleed.  Patient's rectal exam shows melena strongly guaiac positive.  Blood work has resulted showing hemoglobin of 4.0.  Baseline appears to be around 8.  I have ordered 1 unit of emergency release blood as well as 1 unit of typed specific blood.  Patient has a significant leukocytosis of 25,000, reassuringly is afebrile and has a benign abdominal exam.  Chest x-ray pending, I have added on hepatic function panel as well as lipase.  Patient will obviously require admission to the hospital once her ER work-up has been completed.  I reviewed the patient's record she was recently discharged from the hospital 01/19/2019.  We will check a coronavirus swab as a precaution given pending admission.  We will obtain a chest x-ray.  I have also ordered an ammonia level.  Patient's blood pressure is increasing with IV fluids currently around 397 systolic.  I have ordered the Protonix bolus and infusion as well as an octreotide infusion given the patient's history of cirrhosis, although patient denies any nausea vomiting or upper GI bleed symptoms.     Anita Reed was evaluated in Emergency Department on 01/28/2019 for the symptoms described in the history of present illness. She was evaluated in the context of the global COVID-19 pandemic, which necessitated consideration that the patient might be at risk for infection with the SARS-CoV-2 virus that causes COVID-19. Institutional protocols and algorithms that pertain to the evaluation of patients at risk for COVID-19 are in a state of rapid change based on information released  by regulatory bodies including the CDC and federal and state organizations. These policies and algorithms were followed during the patient's care in the ED.  EKG viewed and interpreted by myself shows what appears to be a sinus tachycardia at 115 bpm with a narrow QRS, normal axis, nonspecific ST changes low amplitude.   CRITICAL CARE Performed by: Harvest Dark   Total critical care time: 45 minutes  Critical care time was exclusive of separately billable procedures and treating other patients.  Critical care was necessary to treat or prevent imminent or life-threatening deterioration.  Critical care was time spent personally by me on the following activities: development of treatment plan with patient and/or surrogate as well as nursing, discussions with consultants, evaluation of patient's response to treatment, examination of patient, obtaining history from patient or surrogate, ordering and performing treatments and interventions, ordering and review of laboratory studies, ordering and review of radiographic studies, pulse oximetry and re-evaluation of patient's condition.   ____________________________________________   FINAL CLINICAL IMPRESSION(S) / ED DIAGNOSES  GI bleed Anemia, symptomatic  Harvest Dark, MD 01/28/19 1818    Harvest Dark, MD 02/12/19 [phone]

## 2019-01-28 NOTE — ED Triage Notes (Signed)
Patient was recently admitted for low hemoglobin per patient.  Patient states since Saturday she has been feeling suddenly worse.  Patient states, "I felt better when I was discharged but now, I can't walk down the hallway without feeling very short of breath."  Patient reports occasional black stools.  Patient's tongue is very pale.  Patient's skin is pale and yellow.

## 2019-01-28 NOTE — ED Notes (Signed)
ED TO INPATIENT HANDOFF REPORT  ED Nurse Name and Phone #:  [phone]  S Name/Age/Gender Anita Reed 58 y.o. female Room/Bed: ED04A/ED04A  Code Status   Code Status: Full Code  Home/SNF/Other Home Patient oriented to: self Is this baseline? Yes   Triage Complete: Triage complete  Chief Complaint weakness/shortness of breath  Triage Note Patient was recently admitted for low hemoglobin per patient.  Patient states since Saturday she has been feeling suddenly worse.  Patient states, "I felt better when I was discharged but now, I can't walk down the hallway without feeling very short of breath."  Patient reports occasional black stools.  Patient's tongue is very pale.  Patient's skin is pale and yellow.     Allergies No Known Allergies  Level of Care/Admitting Diagnosis ED Disposition    ED Disposition Condition [place] [facility] Area: [place] [[id number]]  Level of Care: Telemetry [5]  Covid Evaluation: Screening Protocol (No Symptoms)  Diagnosis: GI bleed [599357]  Admitting Physician: Otila Back [place]  Attending Physician: Otila Back [[id number]]  Estimated length of stay: past midnight tomorrow  Certification:: I certify this patient will need inpatient services for at least 2 midnights  PT Class (Do Not Modify): Inpatient [101]  PT Acc Code (Do Not Modify): Private [1]       B Medical/Surgery History Past Medical History:  Diagnosis Date  . Cancer ([place])   . Hypertension    Past Surgical History:  Procedure Laterality Date  . ABDOMINAL HYSTERECTOMY    . APPENDECTOMY       A IV Location/Drains/Wounds Patient Lines/Drains/Airways Status   Active Line/Drains/Airways    Name:   Placement date:   Placement time:   Site:   Days:   Peripheral IV 01/28/19 Right Forearm   01/28/19    1737    Forearm   less than 1   Peripheral IV 01/28/19 Right Antecubital   01/28/19    1754    Antecubital   less than 1          Intake/Output  Last 24 hours No intake or output data in the 24 hours ending 01/28/19 1940  Labs/Imaging Results for orders placed or performed during the hospital encounter of 01/28/19 (from the past 48 hour(s))  CBC     Status: Abnormal   Collection Time: 01/28/19  4:56 PM  Result Value Ref Range   WBC 25.8 (H) 4.0 - 10.5 K/uL   RBC 1.64 (L) 3.87 - 5.11 MIL/uL   Hemoglobin 4.0 (LL) 12.0 - 15.0 g/dL    Comment: This critical result has verified and been called to ANNA HOLT by [facility] on 05 31 2020 at 1711, and has been read back.    HCT 13.0 (LL) 36.0 - 46.0 %    Comment: This critical result has verified and been called to ANNA HOLT by [facility] on 05 31 2020 at 1711, and has been read back.    MCV 79.3 (L) 80.0 - 100.0 fL   MCH 24.4 (L) 26.0 - 34.0 pg   MCHC 30.8 30.0 - 36.0 g/dL   RDW 20.1 (H) 11.5 - 15.5 %   Platelets 288 150 - 400 K/uL   nRBC 0.1 0.0 - 0.2 %    Comment: Performed at [facility], [address], [place], [place] [address]  Basic metabolic panel     Status: Abnormal   Collection Time: 01/28/19  4:56 PM  Result Value Ref Range  Sodium 128 (L) 135 - 145 mmol/L   Potassium 4.3 3.5 - 5.1 mmol/L   Chloride 94 (L) 98 - 111 mmol/L   CO2 15 (L) 22 - 32 mmol/L   Glucose, Bld 114 (H) 70 - 99 mg/dL   BUN 94 (H) 6 - 20 mg/dL   Creatinine, Ser 1.62 (H) 0.44 - 1.00 mg/dL   Calcium 8.6 (L) 8.9 - 10.3 mg/dL   GFR calc non Af Amer 35 (L) >60 mL/min   GFR calc Af Amer 40 (L) >60 mL/min   Anion gap 19 (H) 5 - 15    Comment: Performed at [facility], [address] [place], [place] [address]  Troponin I - ONCE - STAT     Status: None   Collection Time: 01/28/19  4:56 PM  Result Value Ref Range   Troponin I <0.03 <0.03 ng/mL    Comment: Performed at [facility], [address] [place], [place] [address]  Hepatic function panel     Status: Abnormal   Collection Time: 01/28/19  4:56 PM  Result Value Ref Range   Total Protein 5.6 (L)  6.5 - 8.1 g/dL   Albumin 3.3 (L) 3.5 - 5.0 g/dL   AST 69 (H) 15 - 41 U/L   ALT 30 0 - 44 U/L   Alkaline Phosphatase 196 (H) 38 - 126 U/L   Total Bilirubin 2.3 (H) 0.3 - 1.2 mg/dL   Bilirubin, Direct 1.2 (H) 0.0 - 0.2 mg/dL   Indirect Bilirubin 1.1 (H) 0.3 - 0.9 mg/dL    Comment: Performed at [facility], [address] [place], [place] [address]  Lipase, blood     Status: Abnormal   Collection Time: 01/28/19  4:56 PM  Result Value Ref Range   Lipase 71 (H) 11 - 51 U/L    Comment: Performed at [facility] [address] [place], [place] [address]  Prepare RBC     Status: None (Preliminary result)   Collection Time: 01/28/19  5:34 PM  Result Value Ref Range   Order Confirmation PENDING   Type and screen Ordered by PROVIDER DEFAULT     Status: None (Preliminary result)   Collection Time: 01/28/19  5:34 PM  Result Value Ref Range   ABO/RH(D) O POS    Antibody Screen NEG    Sample Expiration 01/31/2019,2359    Unit Number [id number]    Blood Component Type RED CELLS,LR    Unit division 00    Status of Unit ISSUED    Transfusion Status OK TO TRANSFUSE    Crossmatch Result COMPATIBLE    Unit Number [id number]    Blood Component Type RED CELLS,LR    Unit division 00    Status of Unit ISSUED    Transfusion Status OK TO TRANSFUSE    Crossmatch Result      COMPATIBLE Performed at [facility], [address] [place], [place] [address]   Prepare RBC     Status: None (Preliminary result)   Collection Time: 01/28/19  5:35 PM  Result Value Ref Range   Order Confirmation PENDING   Ammonia     Status: None   Collection Time: 01/28/19  6:18 PM  Result Value Ref Range   Ammonia 10 9 - 35 umol/L    Comment: Performed at [facility], [address], [place], [place] [address]  SARS Coronavirus 2 (CEPHEID - Performed in [place] hospital lab), [facility] Order     Status: None  Collection Time: 01/28/19  6:18 PM  Result Value Ref Range    SARS Coronavirus 2 NEGATIVE NEGATIVE    Comment: (NOTE) If result is NEGATIVE SARS-CoV-2 target nucleic acids are NOT DETECTED. The SARS-CoV-2 RNA is generally detectable in upper and lower  respiratory specimens during the acute phase of infection. The lowest  concentration of SARS-CoV-2 viral copies this assay can detect is 250  copies / mL. A negative result does not preclude SARS-CoV-2 infection  and should not be used as the sole basis for treatment or other  patient management decisions.  A negative result may occur with  improper specimen collection / handling, submission of specimen other  than nasopharyngeal swab, presence of viral mutation(s) within the  areas targeted by this assay, and inadequate number of viral copies  (<250 copies / mL). A negative result must be combined with clinical  observations, patient history, and epidemiological information. If result is POSITIVE SARS-CoV-2 target nucleic acids are DETECTED. The SARS-CoV-2 RNA is generally detectable in upper and lower  respiratory specimens dur ing the acute phase of infection.  Positive  results are indicative of active infection with SARS-CoV-2.  Clinical  correlation with patient history and other diagnostic information is  necessary to determine patient infection status.  Positive results do  not rule out bacterial infection or co-infection with other viruses. If result is PRESUMPTIVE POSTIVE SARS-CoV-2 nucleic acids MAY BE PRESENT.   A presumptive positive result was obtained on the submitted specimen  and confirmed on repeat testing.  While 2019 novel coronavirus  (SARS-CoV-2) nucleic acids may be present in the submitted sample  additional confirmatory testing may be necessary for epidemiological  and / or clinical management purposes  to differentiate between  SARS-CoV-2 and other Sarbecovirus currently known to infect humans.  If clinically indicated additional testing with an alternate test   methodology [phone]) is advised. The SARS-CoV-2 RNA is generally  detectable in upper and lower respiratory sp ecimens during the acute  phase of infection. The expected result is Negative. Fact Sheet for Patients:  StrictlyIdeas.no Fact Sheet for Healthcare Providers: BankingDealers.co.za This test is not yet approved or cleared by the [place] FDA and has been authorized for detection and/or diagnosis of SARS-CoV-2 by FDA under an Emergency Use Authorization (EUA).  This EUA will remain in effect (meaning this test can be used) for the duration of the COVID-19 declaration under Section 564(b)(1) of the Act, 21 U.S.C. section 360bbb-3(b)(1), unless the authorization is terminated or revoked sooner. Performed at [facility], [address] [place], [place] [address]    Dg Chest Port 1 View  Result Date: 01/28/2019 CLINICAL DATA:  Shortness of breath EXAM: PORTABLE CHEST 1 VIEW COMPARISON:  01/18/2019 FINDINGS: Mild cardiomegaly. Lungs clear. No effusions or edema. No acute bony abnormality. IMPRESSION: Cardiomegaly.  No active disease. Electronically Signed   By: Rolm Baptise M.D.   On: 01/28/2019 18:18    Pending Labs Unresulted Labs (From admission, onward)    Start     Ordered   01/29/19 0932  Basic metabolic panel  Tomorrow morning,   STAT     01/28/19 1925   01/29/19 0500  CBC  Tomorrow morning,   STAT     01/28/19 1925   01/29/19 0500  Magnesium  Tomorrow morning,   STAT     01/28/19 1925   01/29/19 0200  Hemoglobin and hematocrit, blood  Once,   STAT     01/28/19 1925   01/28/19 1905  Blood gas, arterial  ONCE - STAT,   STAT     01/28/19 1904   01/28/19 1730  Urinalysis, Complete w Microscopic  Once,   STAT     01/28/19 1729          Vitals/Pain Today's Vitals   01/28/19 1905 01/28/19 1909 01/28/19 1912 01/28/19 1918  BP: 98/82     Pulse:  (!) 107 (!) 112 (!) 107  Resp: (!) 30 (!) 25 (!) 24 (!)  26  Temp:      TempSrc:      SpO2:  100% 98% 95%    Isolation Precautions No active isolations  Medications Medications  0.9 %  sodium chloride infusion (has no administration in time range)  0.9 %  sodium chloride infusion (has no administration in time range)  pantoprazole (PROTONIX) 80 mg in sodium chloride 0.9 % 250 mL (0.32 mg/mL) infusion (has no administration in time range)  octreotide (SANDOSTATIN) 500 mcg in sodium chloride 0.9 % 250 mL (2 mcg/mL) infusion (has no administration in time range)  0.9 %  sodium chloride infusion (has no administration in time range)  LORazepam (ATIVAN) injection 2 mg (has no administration in time range)  thiamine (VITAMIN B-1) tablet 100 mg (has no administration in time range)  folic acid (FOLVITE) tablet 1 mg (has no administration in time range)  multivitamin (PROSIGHT) tablet 1 tablet (has no administration in time range)  cefTRIAXone (ROCEPHIN) 1 g in sodium chloride 0.9 % 100 mL IVPB (has no administration in time range)  pantoprazole (PROTONIX) 80 mg in sodium chloride 0.9 % 100 mL IVPB (80 mg Intravenous New Bag/Given 01/28/19 1852)  sodium chloride 0.9 % bolus 1,000 mL (1,000 mLs Intravenous New Bag/Given 01/28/19 1758)  LORazepam (ATIVAN) injection 1 mg (1 mg Intravenous Given 01/28/19 1817)    Mobility walks Low fall risk   Focused Assessments Cardiac Assessment Handoff:    Lab Results  Component Value Date   TROPONINI <0.03 01/28/2019   No results found for: DDIMER Does the Patient currently have chest pain? No     R Recommendations: See Admitting Provider Note  Report given to:   Additional Notes:

## 2019-01-28 NOTE — H&P (Addendum)
[place] [facility] [place] NAME: Anita Reed    MR#:  [id number]  DATE OF BIRTH:  04-26-1961  DATE OF ADMISSION:  01/28/2019  PRIMARY CARE PHYSICIAN: Patient, No Pcp Per   REQUESTING/REFERRING PHYSICIAN: Dr. Harvest Dark  CHIEF COMPLAINT:   Chief Complaint  Patient presents with  . Weakness  . Shortness of Breath    HISTORY OF PRESENT ILLNESS:  Anita Reed  is a 58 y.o. female with a known history of liver cirrhosis, hypertension and remote history of lymphoma treated about 11 years ago who presented to the emergency room today with complaints of shortness of breath and generalized weakness.  Patient also reported noticing black stools at home.  Abdomen distended but not new due to underlying history of liver cirrhosis.  No abdominal pains.  No fevers.  No chest pain.  Patient reported to have intermittent episodes of confusion as well.  Patient's systolic blood pressure was initially in the upper 80s on presentation.  With IV fluid hydration in the emergency room recent systolic blood pressure had improved to 101.  Laboratory studies done in the emergency room significant for hemoglobin of 4.0.  Patient denies any vomiting.  Reported to be Hemoccult positive in the emergency room.  Evidence of acute kidney injury with creatinine of 1.62.  Patient diagnosed with GI bleed and 2 units of packed red blood cell transfusion was ordered in the emergency room.  Patient already started on octreotide drip and Protonix drip.  Medical service called to admit patient for further evaluation and management.  PAST MEDICAL HISTORY:   Past Medical History:  Diagnosis Date  . Cancer ([place])   . Hypertension     PAST SURGICAL HISTORY:   Past Surgical History:  Procedure Laterality Date  . ABDOMINAL HYSTERECTOMY    . APPENDECTOMY      SOCIAL HISTORY:   Social History   Tobacco Use  . Smoking status: Current Every Day Smoker  . Smokeless tobacco:  Never Used  Substance Use Topics  . Alcohol use: Yes  Patient smokes 1 pack of cigarettes per day.  History of alcohol use.  Last alcohol use was 2 days ago.  FAMILY HISTORY:  No family history on file.  No family history of coronary artery disease or diabetes mellitus.  DRUG ALLERGIES:  No Known Allergies  REVIEW OF SYSTEMS:   Review of Systems  Constitutional: Negative for chills and fever.  HENT: Negative for hearing loss and tinnitus.   Eyes: Negative for blurred vision.  Respiratory: Positive for shortness of breath. Negative for cough.   Cardiovascular: Negative for chest pain and palpitations.  Gastrointestinal: Positive for blood in stool. Negative for abdominal pain, heartburn, nausea and vomiting.  Genitourinary: Negative for dysuria and urgency.  Musculoskeletal: Negative for myalgias and neck pain.  Skin: Negative for itching and rash.  Neurological: Negative for dizziness and headaches.  Psychiatric/Behavioral: Negative for depression and hallucinations.    MEDICATIONS AT HOME:   Prior to Admission medications   Medication Sig Start Date End Date Taking? Authorizing Provider  carvedilol (COREG) 3.125 MG tablet Take 1 tablet (3.125 mg total) by mouth 2 (two) times daily with a meal. 01/19/19   Gladstone Lighter, MD  furosemide (LASIX) 40 MG tablet Take 1 tablet (40 mg total) by mouth daily. 01/19/19 05/19/19  Gladstone Lighter, MD  spironolactone (ALDACTONE) 50 MG tablet Take 1 tablet (50 mg total) by mouth daily. 01/19/19 05/19/19  Gladstone Lighter, MD  VITAL SIGNS:  Blood pressure 98/82, pulse (!) 107, temperature 97.6 F (36.4 C), temperature source Oral, resp. rate (!) 26, SpO2 95 %.  PHYSICAL EXAMINATION:  Physical Exam  GENERAL:  58 y.o.-year-old patient lying in the bed with no acute distress.  EYES: Pupils equal, round, reactive to light and accommodation. No scleral icterus. Extraocular muscles intact.  HEENT: Head atraumatic, normocephalic.  Oropharynx and nasopharynx clear.  NECK:  Supple, no jugular venous distention. No thyroid enlargement, no tenderness.  LUNGS: Normal breath sounds bilaterally, no wheezing, rales,rhonchi or crepitation. No use of accessory muscles of respiration.  CARDIOVASCULAR: S1, S2 normal. No murmurs, rubs, or gallops.  ABDOMEN: Soft, nontender, nondistended. Bowel sounds present. No organomegaly or mass.  EXTREMITIES: No pedal edema, cyanosis, or clubbing.  NEUROLOGIC: Cranial nerves II through XII are intact. Muscle strength 5/5 in all extremities. Sensation intact. Gait not checked.  PSYCHIATRIC: The patient is alert and oriented x 3.  SKIN: No obvious rash, lesion, or ulcer.   LABORATORY PANEL:   CBC Recent Labs  Lab 01/28/19 1656  WBC 25.8*  HGB 4.0*  HCT 13.0*  PLT 288   ------------------------------------------------------------------------------------------------------------------  Chemistries  Recent Labs  Lab 01/28/19 1656  NA 128*  K 4.3  CL 94*  CO2 15*  GLUCOSE 114*  BUN 94*  CREATININE 1.62*  CALCIUM 8.6*  AST 69*  ALT 30  ALKPHOS 196*  BILITOT 2.3*   ------------------------------------------------------------------------------------------------------------------  Cardiac Enzymes Recent Labs  Lab 01/28/19 1656  TROPONINI <0.03   ------------------------------------------------------------------------------------------------------------------  RADIOLOGY:  Dg Chest Port 1 View  Result Date: 01/28/2019 CLINICAL DATA:  Shortness of breath EXAM: PORTABLE CHEST 1 VIEW COMPARISON:  01/18/2019 FINDINGS: Mild cardiomegaly. Lungs clear. No effusions or edema. No acute bony abnormality. IMPRESSION: Cardiomegaly.  No active disease. Electronically Signed   By: Rolm Baptise M.D.   On: 01/28/2019 18:18      IMPRESSION AND PLAN:  Patient is a 58 year old female with history of hypertension , liver cirrhosis and remote history of lymphoma treated 11 years ago being  admitted for further evaluation and management of GI bleed.  1.  GI bleed. Patient with dark stools.  Hemoccult positive in the emergency room.  Hemoglobin of 4.0. No vomiting.  No active bleed at this time. Being transfused with 2 units of packed red blood cells in the emergency room.  Already started on Protonix drip and octreotide drip given history of liver cirrhosis which was continued. IV fluid hydration.  Monitor hemoglobin and hematocrit every 8 hours. GI consult placed.  Message sent to gastroenterologist via haiku Patient placed on IV Rocephin for SBP prophylaxis.  2.  Acute blood loss anemia Secondary to GI bleed.  Hemoglobin of 4.0. Being transfused with 2 units of packed red blood cells.  Follow-up on hemoglobin.  3.  Acute kidney injury. Hold off on diuretics.  Placed on IV fluid hydration.  Follow-up for renal function in a.m.  4.  Hyponatremia with sodium of 128. Most likely due to dehydration.  Being hydrated with IV fluids.  Follow-up on renal function in a.m.  5.  Hypertension Holding off on blood pressure medications due to blood pressure being low on presentation.  IV fluid hydration.  6.  History of alcohol abuse. Last drink was 2 days ago.  Placed on CIWA protocol.  Placed on p.o. thiamine, multivitamin and folic acid.  7.  Tobacco abuse. Smoking cessation counseling done for 5 minutes.  Patient declined nicotine patch.  8.  History of liver cirrhosis  with ascites. Abdomen soft.  No indication for paracentesis at this time.  Patient had ultrasound-guided paracentesis done during recent admission.  DVT prophylaxis; SCDs No heparin products due to GI bleed  All the records are reviewed and case discussed with ED provider. Management plans discussed with the patient, family and they are in agreement.  CODE STATUS: Full code  TOTAL TIME TAKING CARE OF THIS PATIENT: 63 minutes.    Anita Reed M.D on 01/28/2019 at 7:25 PM  Between 7am to 6pm - Pager -  [phone]  After 6pm go to [email or web address] - Proofreader  [facility]  Office  [phone]  CC: Primary care physician; Patient, No Pcp Per   Note: This dictation was prepared with Dragon dictation along with smaller phrase technology. Any transcriptional errors that result from this process are unintentional.

## 2019-01-28 NOTE — Plan of Care (Signed)
Arrived from ED with 2nd unit PRBCs transfusing.  No signs of reaction noted at this time.

## 2019-01-29 ENCOUNTER — Encounter
Admission: EM | Disposition: A | Discharge status: Home / Self Care | Payer: Self-pay | Source: Home / Self Care | Attending: Internal Medicine

## 2019-01-29 ENCOUNTER — Inpatient Hospital Stay: Payer: Medicare Other

## 2019-01-29 ENCOUNTER — Inpatient Hospital Stay: Payer: Medicare Other | Admitting: Anesthesiology

## 2019-01-29 ENCOUNTER — Encounter: Payer: Self-pay | Admitting: Anesthesiology

## 2019-01-29 DIAGNOSIS — K921 Melena: Secondary | ICD-10-CM

## 2019-01-29 DIAGNOSIS — D62 Acute posthemorrhagic anemia: Secondary | ICD-10-CM

## 2019-01-29 DIAGNOSIS — K3182 Dieulafoy lesion (hemorrhagic) of stomach and duodenum: Principal | ICD-10-CM

## 2019-01-29 DIAGNOSIS — K449 Diaphragmatic hernia without obstruction or gangrene: Secondary | ICD-10-CM

## 2019-01-29 DIAGNOSIS — K3189 Other diseases of stomach and duodenum: Secondary | ICD-10-CM

## 2019-01-29 HISTORY — PX: ESOPHAGOGASTRODUODENOSCOPY: SHX5428

## 2019-01-29 LAB — URINALYSIS, COMPLETE (UACMP) WITH MICROSCOPIC
Bacteria, UA: NONE SEEN
Bilirubin Urine: NEGATIVE
Glucose, UA: NEGATIVE mg/dL
Hgb urine dipstick: NEGATIVE
Ketones, ur: NEGATIVE mg/dL
Nitrite: NEGATIVE
Protein, ur: NEGATIVE mg/dL
Specific Gravity, Urine: 1.011 (ref 1.005–1.030)
pH: 5 (ref 5.0–8.0)

## 2019-01-29 LAB — CBC
HCT: 16.6 % — ABNORMAL LOW (ref 36.0–46.0)
Hemoglobin: 5.6 g/dL — ABNORMAL LOW (ref 12.0–15.0)
MCH: 27.2 pg (ref 26.0–34.0)
MCHC: 33.7 g/dL (ref 30.0–36.0)
MCV: 80.6 fL (ref 80.0–100.0)
Platelets: 199 10*3/uL (ref 150–400)
RBC: 2.06 MIL/uL — ABNORMAL LOW (ref 3.87–5.11)
RDW: 17.9 % — ABNORMAL HIGH (ref 11.5–15.5)
WBC: 21.9 10*3/uL — ABNORMAL HIGH (ref 4.0–10.5)
nRBC: 0.2 % (ref 0.0–0.2)

## 2019-01-29 LAB — HEMOGLOBIN AND HEMATOCRIT, BLOOD
HCT: 21.4 % — ABNORMAL LOW (ref 36.0–46.0)
HCT: 26.6 % — ABNORMAL LOW (ref 36.0–46.0)
Hemoglobin: 6.9 g/dL — ABNORMAL LOW (ref 12.0–15.0)
Hemoglobin: 8.7 g/dL — ABNORMAL LOW (ref 12.0–15.0)

## 2019-01-29 LAB — BASIC METABOLIC PANEL
Anion gap: 13 (ref 5–15)
BUN: 98 mg/dL — ABNORMAL HIGH (ref 6–20)
CO2: 19 mmol/L — ABNORMAL LOW (ref 22–32)
Calcium: 7.8 mg/dL — ABNORMAL LOW (ref 8.9–10.3)
Chloride: 98 mmol/L (ref 98–111)
Creatinine, Ser: 1.72 mg/dL — ABNORMAL HIGH (ref 0.44–1.00)
GFR calc Af Amer: 38 mL/min — ABNORMAL LOW (ref >60)
GFR calc non Af Amer: 32 mL/min — ABNORMAL LOW (ref >60)
Glucose, Bld: 112 mg/dL — ABNORMAL HIGH (ref 70–99)
Potassium: 4.1 mmol/L (ref 3.5–5.1)
Sodium: 130 mmol/L — ABNORMAL LOW (ref 135–145)

## 2019-01-29 LAB — PREPARE RBC (CROSSMATCH)

## 2019-01-29 LAB — MAGNESIUM: Magnesium: 2.1 mg/dL (ref 1.7–2.4)

## 2019-01-29 IMAGING — DX PORTABLE CHEST - 1 VIEW
1 series · 1 of 1 positions shown · non-contrast
Comparison: [DATE]

CLINICAL DATA: Wheezing.

EXAM:
PORTABLE CHEST 1 VIEW

[chest ap]
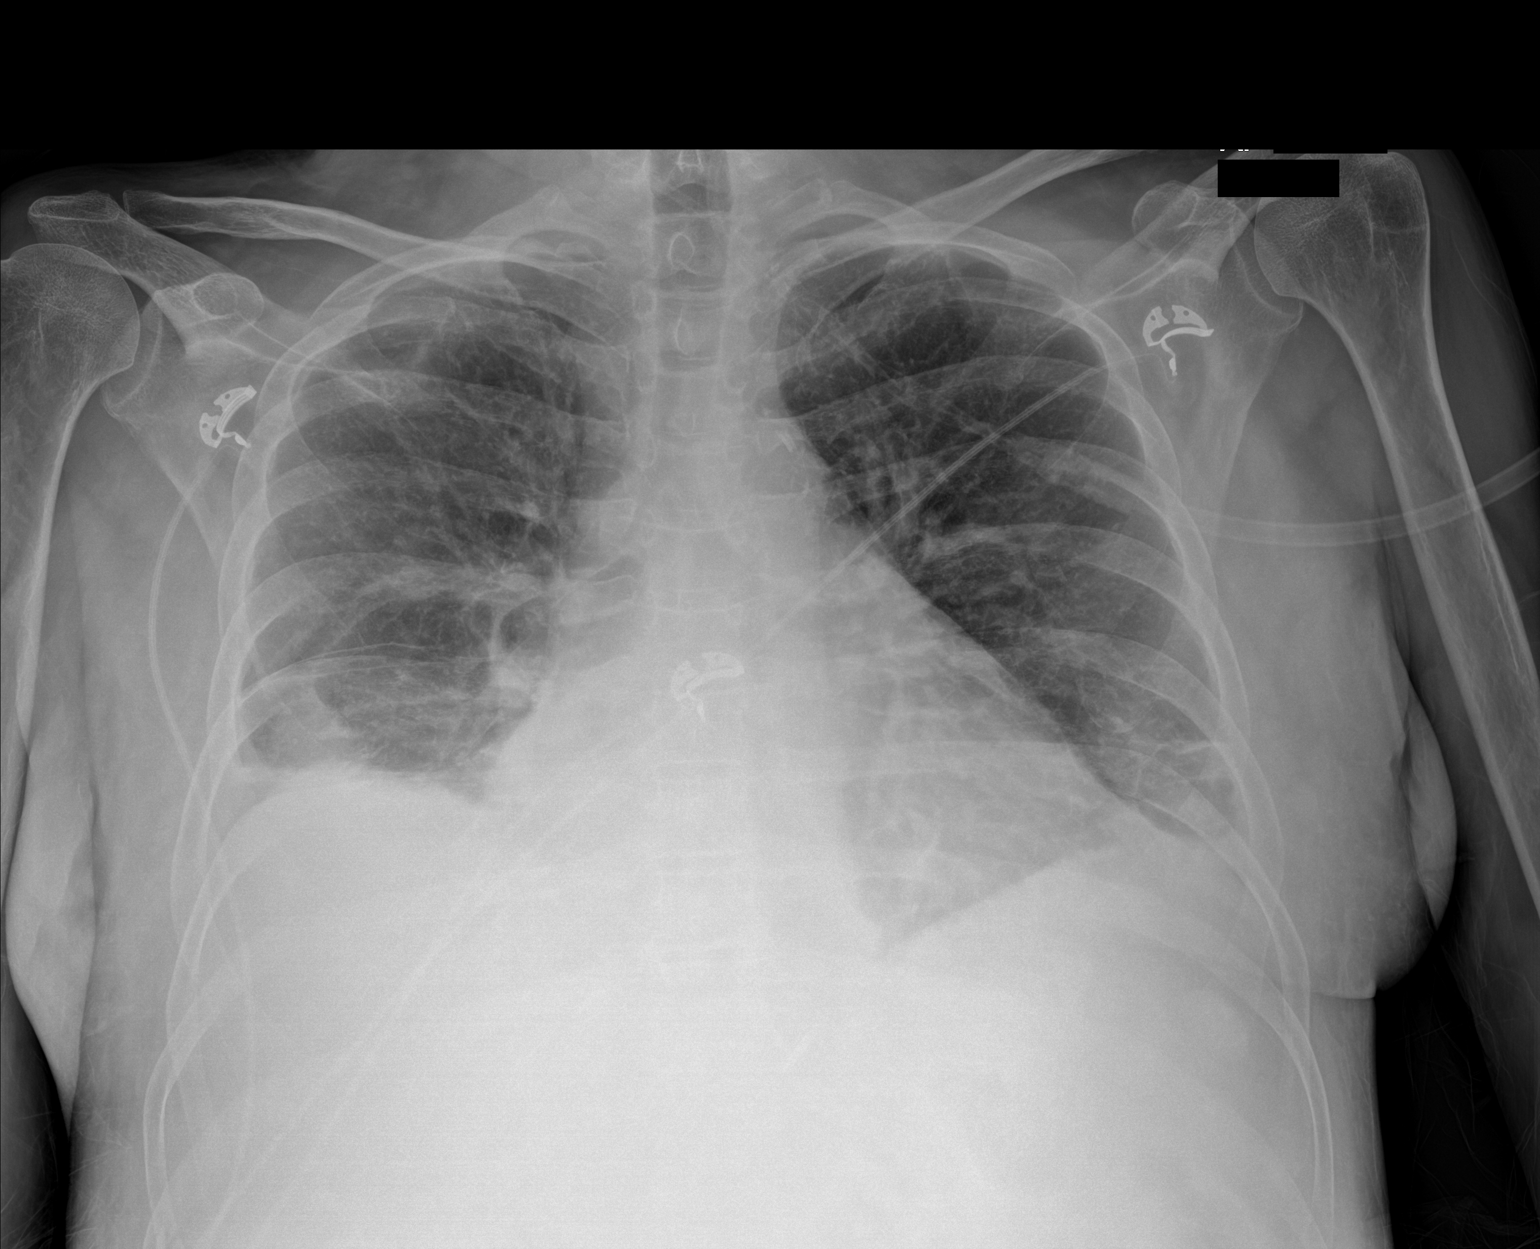

[1 of 1 positions shown; findings below may reference images not displayed]

FINDINGS: The lung volumes are low. The cardiac silhouette is enlarged. There
is developing pulmonary edema and small bilateral pleural effusions.
There is no pneumothorax. No acute osseous abnormality. Bibasilar
airspace opacities are favored to represent atelectasis.
IMPRESSION: Cardiomegaly with developing pulmonary edema and small bilateral
pleural effusions.

## 2019-01-29 SURGERY — EGD (ESOPHAGOGASTRODUODENOSCOPY)
Anesthesia: General

## 2019-01-29 MED ORDER — FUROSEMIDE 10 MG/ML IJ SOLN
8.0000 mg/h | INTRAVENOUS | Status: AC
  Administered 2019-01-30: 8 mg/h via INTRAVENOUS
  Filled 2019-01-29: qty 25

## 2019-01-29 MED ORDER — SODIUM CHLORIDE 0.9 % IV BOLUS
1000.0000 mL | Freq: Once | INTRAVENOUS | Status: AC
  Administered 2019-01-29: 1000 mL via INTRAVENOUS

## 2019-01-29 MED ORDER — SODIUM CHLORIDE 0.9 % IV SOLN
INTRAVENOUS | Status: DC | PRN
  Administered 2019-01-29: 250 mL via INTRAVENOUS
  Administered 2019-01-29: 15:00:00 via INTRAVENOUS

## 2019-01-29 MED ORDER — ORAL CARE MOUTH RINSE
15.0000 mL | Freq: Two times a day (BID) | OROMUCOSAL | Status: DC
  Administered 2019-01-29 – 2019-01-31 (×4): 15 mL via OROMUCOSAL

## 2019-01-29 MED ORDER — MORPHINE SULFATE (PF) 2 MG/ML IV SOLN: 1.0000 mg | INTRAVENOUS | Status: DC | PRN

## 2019-01-29 MED ORDER — SIMETHICONE 40 MG/0.6ML PO SUSP
80.0000 mg | Freq: Four times a day (QID) | ORAL | Status: DC | PRN
  Administered 2019-01-29 – 2019-01-30 (×2): 80 mg via ORAL
  Filled 2019-01-29 (×4): qty 1.2

## 2019-01-29 MED ORDER — BOOST / RESOURCE BREEZE PO LIQD CUSTOM
1.0000 | Freq: Three times a day (TID) | ORAL | Status: DC
  Administered 2019-01-29 – 2019-01-31 (×4): 1 via ORAL

## 2019-01-29 MED ORDER — SODIUM CHLORIDE 0.9% IV SOLUTION
Freq: Once | INTRAVENOUS | Status: AC
  Administered 2019-01-29: 07:00:00 via INTRAVENOUS

## 2019-01-29 MED ORDER — PHENYLEPHRINE HCL (PRESSORS) 10 MG/ML IV SOLN
INTRAVENOUS | Status: DC | PRN
  Administered 2019-01-29 (×4): 100 ug via INTRAVENOUS

## 2019-01-29 MED ORDER — SODIUM CHLORIDE 0.9 % IV SOLN
INTRAVENOUS | Status: DC
  Administered 2019-01-29: 15:00:00 via INTRAVENOUS

## 2019-01-29 MED ORDER — EPHEDRINE SULFATE 50 MG/ML IJ SOLN
INTRAMUSCULAR | Status: DC | PRN
  Administered 2019-01-29: 5 mg via INTRAVENOUS

## 2019-01-29 MED ORDER — SODIUM CHLORIDE 0.9% IV SOLUTION: Freq: Once | INTRAVENOUS | Status: DC

## 2019-01-29 MED ORDER — PROPOFOL 10 MG/ML IV BOLUS
INTRAVENOUS | Status: DC | PRN
  Administered 2019-01-29 (×5): 20 mg via INTRAVENOUS

## 2019-01-29 NOTE — Op Note (Signed)
[facility] Gastroenterology Patient Name: Anita Reed Procedure Date: 01/29/2019 3:11 PM MRN: [id number] Account #: [id number] Date of Birth: 05/16/1961 Admit Type: Inpatient Age: 58 Room: [facility] ENDO ROOM 4 Gender: Female Note Status: Finalized Procedure:            Upper GI endoscopy Indications:          Acute post hemorrhagic anemia, Melena Providers:            [name] MD, MD Referring MD:         No Local Md, MD (Referring MD) Medicines:            Monitored Anesthesia Care Complications:        No immediate complications. Estimated blood loss: None. Procedure:            Pre-Anesthesia Assessment:                       - Prior to the procedure, a History and Physical was                        performed, and patient medications and allergies were                        reviewed. The patient is competent. The risks and                        benefits of the procedure and the sedation options and                        risks were discussed with the patient. All questions                        were answered and informed consent was obtained.                        Patient identification and proposed procedure were                        verified by the physician, the nurse, the                        anesthesiologist, the anesthetist and the technician in                        the pre-procedure area in the procedure room in the                        endoscopy suite. Mental Status Examination: alert and                        oriented. Airway Examination: normal oropharyngeal                        airway and neck mobility. Respiratory Examination:                        clear to auscultation. CV Examination: normal.                        Prophylactic Antibiotics: The patient does not require  prophylactic antibiotics. Prior Anticoagulants: The                        patient has taken no previous anticoagulant or              antiplatelet agents. ASA Grade Assessment: III - A                        patient with severe systemic disease. After reviewing                        the risks and benefits, the patient was deemed in                        satisfactory condition to undergo the procedure. The                        anesthesia plan was to use monitored anesthesia care                        (MAC). Immediately prior to administration of                        medications, the patient was re-assessed for adequacy                        to receive sedatives. The heart rate, respiratory rate,                        oxygen saturations, blood pressure, adequacy of                        pulmonary ventilation, and response to care were                        monitored throughout the procedure. The physical status                        of the patient was re-assessed after the procedure.                       After obtaining informed consent, the endoscope was                        passed under direct vision. Throughout the procedure,                        the patient's blood pressure, pulse, and oxygen                        saturations were monitored continuously. The Endoscope                        was introduced through the mouth, and advanced to the                        second part of duodenum. The upper GI endoscopy was                        accomplished without difficulty. The patient tolerated  the procedure fairly well. Findings:      The duodenal bulb and second portion of the duodenum were normal.      Severe portal hypertensive gastropathy was found in the gastric fundus.      A 2 cm hiatal hernia was present.      Red blood was found in the gastric fundus.      A Dieulafoy lesion with spurting bleeding and was found in the hernial       sac. To stop active bleeding, one hemostatic clip was successfully       placed (MR conditional). There was no bleeding at the  end of the       procedure.      Large (> 5 mm) varices were found in the lower third of the esophagus,       with no red wale signs or stigmata of recent bleed. Impression:           - Normal duodenal bulb and second portion of the                        duodenum.                       - Portal hypertensive gastropathy.                       - 2 cm hiatal hernia.                       - Red blood in the gastric fundus.                       - Dieulafoy lesion of stomach.                       - No specimens collected. Recommendation:       - Return patient to hospital ward for ongoing care.                       - Full liquid diet today.                       - Continue present medications. Procedure Code(s):    --- Professional ---                       [phone], Esophagogastroduodenoscopy, flexible, transoral;                        with control of bleeding, any method Diagnosis Code(s):    --- Professional ---                       K76.6, Portal hypertension                       K31.89, Other diseases of stomach and duodenum                       K44.9, Diaphragmatic hernia without obstruction or                        gangrene  K92.2, Gastrointestinal hemorrhage, unspecified                       K31.82, Dieulafoy lesion (hemorrhagic) of stomach and                        duodenum                       D62, Acute posthemorrhagic anemia                       K92.1, Melena (includes Hematochezia) CPT copyright 2019 American Medical Association. All rights reserved. The codes documented in this report are preliminary and upon coder review may  be revised to meet current compliance requirements. Dr. Ulyess Mort [name] MD, MD 01/29/2019 3:35:10 PM This report has been signed electronically. Number of Addenda: 0 Note Initiated On: 01/29/2019 3:11 PM      [facility]

## 2019-01-29 NOTE — Anesthesia Postprocedure Evaluation (Signed)
Anesthesia Post Note  Patient: Anita Reed  Procedure(s) Performed: ESOPHAGOGASTRODUODENOSCOPY (EGD) (N/A )  Patient location during evaluation: Endoscopy Anesthesia Type: General Level of consciousness: awake and alert and oriented Pain management: pain level controlled Vital Signs Assessment: post-procedure vital signs reviewed and stable Respiratory status: spontaneous breathing, nonlabored ventilation and respiratory function stable Cardiovascular status: blood pressure returned to baseline and stable Postop Assessment: no signs of nausea or vomiting Anesthetic complications: no     Last Vitals:  Vitals:   01/29/19 1534 01/29/19 1544  BP: 108/67 112/66  Pulse: (!) 115   Resp: (!) 25   Temp: (!) 36.3 C   SpO2: 94%     Last Pain:  Vitals:   01/29/19 1534  TempSrc: Tympanic  PainSc:                  Chantay Whitelock

## 2019-01-29 NOTE — Anesthesia Preprocedure Evaluation (Signed)
Anesthesia Evaluation  Patient identified by MRN, date of birth, ID band Patient awake    Reviewed: Allergy & Precautions, NPO status , Patient's Chart, lab work & pertinent test results  History of Anesthesia Complications Negative for: history of anesthetic complications  Airway Mallampati: II  TM Distance: >3 FB Neck ROM: Full    Dental  (+) Poor Dentition   Pulmonary neg sleep apnea, neg COPD, Current Smoker,    breath sounds clear to auscultation- rhonchi (-) wheezing      Cardiovascular hypertension, Pt. on medications (-) CAD, (-) Past MI, (-) Cardiac Stents and (-) CABG  Rhythm:Regular Rate:Normal - Systolic murmurs and - Diastolic murmurs    Neuro/Psych neg Seizures negative neurological ROS  negative psych ROS   GI/Hepatic Neg liver ROS, GIB    Endo/Other  negative endocrine ROSneg diabetes  Renal/GU negative Renal ROS     Musculoskeletal negative musculoskeletal ROS (+)   Abdominal (+) - obese,   Peds  Hematology negative hematology ROS (+)   Anesthesia Other Findings Past Medical History: No date: Cancer ([place]) No date: Hypertension   Reproductive/Obstetrics                             Anesthesia Physical Anesthesia Plan  ASA: II  Anesthesia Plan: General   Post-op Pain Management:    Induction: Intravenous  PONV Risk Score and Plan: 1 and Propofol infusion  Airway Management Planned: Natural Airway  Additional Equipment:   Intra-op Plan:   Post-operative Plan:   Informed Consent: I have reviewed the patients History and Physical, chart, labs and discussed the procedure including the risks, benefits and alternatives for the proposed anesthesia with the patient or authorized representative who has indicated his/her understanding and acceptance.     Dental advisory given  Plan Discussed with: CRNA and Anesthesiologist  Anesthesia Plan Comments:          Anesthesia Quick Evaluation

## 2019-01-29 NOTE — Transfer of Care (Signed)
Immediate Anesthesia Transfer of Care Note  Patient: Anita Reed  Procedure(s) Performed: ESOPHAGOGASTRODUODENOSCOPY (EGD) (N/A )  Patient Location: Endoscopy Unit  Anesthesia Type:General  Level of Consciousness: awake and patient cooperative  Airway & Oxygen Therapy: Patient Spontanous Breathing and Patient connected to face mask oxygen  Post-op Assessment: Report given to RN and Post -op Vital signs reviewed and stable  Post vital signs: Reviewed and stable  Last Vitals:  Vitals Value Taken Time  BP 108/67 01/29/2019  3:38 PM  Temp 36.3 C 01/29/2019  3:34 PM  Pulse 114 01/29/2019  3:40 PM  Resp 23 01/29/2019  3:40 PM  SpO2 95 % 01/29/2019  3:40 PM  Vitals shown include unvalidated device data.  Last Pain:  Vitals:   01/29/19 1534  TempSrc: Tympanic  PainSc:          Complications: No apparent anesthesia complications

## 2019-01-29 NOTE — OR Nursing (Addendum)
RN GAVE VERBAL REPORT TO [place] RN AFTER RETURNING PATIENT TO FLOOR . S/P UPPER ENDOSCOPY.LARGE NON BLEEDING VARICES,HIATAL HERNIA , PORTAL GASTRIC HYPERTROPHY,DIELAFOY LESION IN STOMACH BLEEDING WITH CLIP PLACEMENT. TO START FULL LIQUID DIET.PT RECEIVED 2 DOSES OF NEO IN INTRAOPERATIVE

## 2019-01-29 NOTE — Progress Notes (Signed)
Patient complained of SOB.  CXR revealed developing pulmonary edema and small effusions.  BP is low normotensive.  Will give lasix via drip over several hours with close watch on BP.  She normally takes 40mg  PO daily at home, which is not ordered here.  Will dose 8mg /hr in drip for 5 hours to equal home dose.  [facility] 01/29/2019, 10:59 PM  Note:  This document was prepared using Dragon voice recognition software and may include unintentional dictation errors.

## 2019-01-29 NOTE — Progress Notes (Addendum)
[place] [facility] [place] NAME: Anita Reed    MR#:  [id number]  DATE OF BIRTH:  1961/03/09  SUBJECTIVE:  CHIEF COMPLAINT:   Chief Complaint  Patient presents with  . Weakness  . Shortness of Breath  Patient seen and evaluated today Has generalized weakness and fatigue Tolerated PRBC transfusions well Currently n.p.o.  REVIEW OF SYSTEMS:    ROS  CONSTITUTIONAL: No documented fever. Has fatigue, weakness. No weight gain, no weight loss.  EYES: No blurry or double vision.  ENT: No tinnitus. No postnasal drip. No redness of the oropharynx.  RESPIRATORY: No cough, no wheeze, no hemoptysis. No dyspnea.  CARDIOVASCULAR: No chest pain. No orthopnea. No palpitations. No syncope.  GASTROINTESTINAL: No nausea, no vomiting or diarrhea. No abdominal pain. No melena or hematochezia.  GENITOURINARY: No dysuria or hematuria.  ENDOCRINE: No polyuria or nocturia. No heat or cold intolerance.  HEMATOLOGY: No anemia. No bruising. No bleeding.  INTEGUMENTARY: No rashes. No lesions.  MUSCULOSKELETAL: No arthritis. No swelling. No gout.  NEUROLOGIC: No numbness, tingling, or ataxia. No seizure-type activity.  PSYCHIATRIC: No anxiety. No insomnia. No ADD.   DRUG ALLERGIES:  No Known Allergies  VITALS:  Blood pressure (!) 87/61, pulse (!) 112, temperature (!) 97.5 F (36.4 C), temperature source Oral, resp. rate 18, height 5\' 9"  (1.753 m), weight 71.1 kg, SpO2 93 %.  PHYSICAL EXAMINATION:   Physical Exam  GENERAL:  58 y.o.-year-old patient lying in the bed with no acute distress.  EYES: Pupils equal, round, reactive to light and accommodation. No scleral icterus. Extraocular muscles intact.  HEENT: Head atraumatic, normocephalic. Oropharynx and nasopharynx clear.  Pallor present NECK:  Supple, no jugular venous distention. No thyroid enlargement, no tenderness.  LUNGS: Normal breath sounds bilaterally, no wheezing, rales, rhonchi. No use of accessory  muscles of respiration.  CARDIOVASCULAR: S1, S2 normal. No murmurs, rubs, or gallops.  ABDOMEN: Soft, nontender, nondistended. Bowel sounds present. No organomegaly or mass.  EXTREMITIES: No cyanosis, clubbing or edema b/l.    NEUROLOGIC: Cranial nerves II through XII are intact. No focal Motor or sensory deficits b/l.   PSYCHIATRIC: The patient is alert and oriented x 3.  SKIN: No obvious rash, lesion, or ulcer.   LABORATORY PANEL:   CBC Recent Labs  Lab 01/29/19 0157  WBC 21.9*  HGB 5.6*  HCT 16.6*  PLT 199   ------------------------------------------------------------------------------------------------------------------ Chemistries  Recent Labs  Lab 01/28/19 1656 01/29/19 0157  NA 128* 130*  K 4.3 4.1  CL 94* 98  CO2 15* 19*  GLUCOSE 114* 112*  BUN 94* 98*  CREATININE 1.62* 1.72*  CALCIUM 8.6* 7.8*  MG  --  2.1  AST 69*  --   ALT 30  --   ALKPHOS 196*  --   BILITOT 2.3*  --    ------------------------------------------------------------------------------------------------------------------  Cardiac Enzymes Recent Labs  Lab 01/28/19 1656  TROPONINI <0.03   ------------------------------------------------------------------------------------------------------------------  RADIOLOGY:  Dg Chest Port 1 View  Result Date: 01/28/2019 CLINICAL DATA:  Shortness of breath EXAM: PORTABLE CHEST 1 VIEW COMPARISON:  01/18/2019 FINDINGS: Mild cardiomegaly. Lungs clear. No effusions or edema. No acute bony abnormality. IMPRESSION: Cardiomegaly.  No active disease. Electronically Signed   By: Rolm Baptise M.D.   On: 01/28/2019 18:18     ASSESSMENT AND PLAN:  58 year old female patient with history of hypertension, cirrhosis of liver, history of lymphoma currently in the hospitalist service for anemia and dark stool  -Acute gastrointestinal bleeding IV fluids and  Protonix drip PRBC transfusions Gastroenterology evaluation for endoscopy Currently on IV Rocephin  antibiotic for SBP prophylaxis  -Acute blood loss anemia PRBC transfusion and hemoglobin hematocrit checkup  -Acute hyponatremia Improving slowly Continue IV fluids  -History of cirrhosis of liver Supportive care  -Acute kidney injury Monitor renal function IV fluids  -History of lymphoma Supportive care   All the records are reviewed and case discussed with Care Management/Social Worker. Management plans discussed with the patient, family and they are in agreement.  CODE STATUS: Full code  DVT Prophylaxis: SCDs  TOTAL TIME TAKING CARE OF THIS PATIENT: 38 minutes.   POSSIBLE D/C IN 2 to 3 DAYS, DEPENDING ON CLINICAL CONDITION.  Saundra Shelling M.D on 01/29/2019 at 11:15 AM  Between 7am to 6pm - Pager - [phone]  After 6pm go to [email or web address] - password [email or web address] [facility]  Office  [phone]  CC: Primary care physician; Patient, No Pcp Per  Note: This dictation was prepared with Dragon dictation along with smaller phrase technology. Any transcriptional errors that result from this process are unintentional.

## 2019-01-29 NOTE — Anesthesia Post-op Follow-up Note (Signed)
Anesthesia QCDR form completed.        

## 2019-01-29 NOTE — Consult Note (Signed)
Anita Darby, MD [address]  [place], [place] [address]  Main: [phone]  Fax: [phone] Pager: [phone]   Consultation  Referring Provider:     No ref. provider found Primary Care Physician:  Patient, No Pcp Per Primary Gastroenterologist: None         Reason for Consultation:     Melena, decompensated cirrhosis  Date of Admission:  01/28/2019 Date of Consultation:  01/29/2019         HPI:   Anita Reed is a 58 y.o. female with recent diagnosis of decompensated cirrhosis.  Patient initially admitted to [facility] with new onset of ascites on 01/18/2019, underwent therapeutic paracentesis, negative for SBP, SAAG > 1.1 consistent with portal hypertension.  She was discharged home on Lasix and spironolactone. She had anemia, presumably with no active GI bleed.  Her hemoglobin was 7.5, MCV 76.5.  She received 1 unit of blood transfusion at that time.  She presented to ER yesterday due to severe weakness, black stools.  Her hemoglobin on admission was 4, MCV 79.3, platelets 288.  She is started on octreotide, pantoprazole drips, ceftriaxone for SBP prophylaxis.  She has been receiving blood transfusions overnight.  Her hemoglobin posttransfusion is 6.9.  Patient admits to binge drinking 1-2 times a week for several years Her hep C antibody came back positive during last admission, she thinks she may have contracted it in her 33s from snorting cocaine and IV drugs   NSAIDs: None  Antiplts/Anticoagulants/Anti thrombotics: None  GI Procedures: None  Past Medical History:  Diagnosis Date   Cancer ([place])    Hypertension     Past Surgical History:  Procedure Laterality Date   ABDOMINAL HYSTERECTOMY     APPENDECTOMY      Prior to Admission medications   Medication Sig Start Date End Date Taking? Authorizing Provider  carvedilol (COREG) 3.125 MG tablet Take 1 tablet (3.125 mg total) by mouth 2 (two) times daily with a meal. 01/19/19   Gladstone Lighter, MD  furosemide (LASIX) 40 MG tablet Take 1 tablet (40 mg total) by mouth daily. 01/19/19 05/19/19  Gladstone Lighter, MD  spironolactone (ALDACTONE) 50 MG tablet Take 1 tablet (50 mg total) by mouth daily. 01/19/19 05/19/19  Gladstone Lighter, MD   Current Facility-Administered Medications:    0.9 %  sodium chloride infusion (Manually program via Guardrails IV Fluids), , Intravenous, Once, Pyreddy, Pavan, MD   0.9 %  sodium chloride infusion, , Intravenous, Continuous, Ojie, Jude, MD, Last Rate: 100 mL/hr at 01/29/19 0323   0.9 %  sodium chloride infusion, , Intravenous, PRN, Stark Jock, Jude, MD, Stopped at 01/29/19 0223   cefTRIAXone (ROCEPHIN) 1 g in sodium chloride 0.9 % 100 mL IVPB, 1 g, Intravenous, Q24H, Ojie, Jude, MD, Stopped at 48/54/62 7035   folic acid (FOLVITE) tablet 1 mg, 1 mg, Oral, Daily, Ojie, Jude, MD, 1 mg at 01/28/19 2211   LORazepam (ATIVAN) injection 2 mg, 2 mg, Intravenous, Q1H PRN, Ojie, Jude, MD, 2 mg at 01/29/19 0934   MEDLINE mouth rinse, 15 mL, Mouth Rinse, BID, Ojie, Jude, MD   multivitamin with minerals tablet 1 tablet, 1 tablet, Oral, Daily, Ojie, Jude, MD, 1 tablet at 01/28/19 2210   octreotide (SANDOSTATIN) 500 mcg in sodium chloride 0.9 % 250 mL (2 mcg/mL) infusion, 50 mcg/hr, Intravenous, Continuous, Ojie, Jude, MD, Last Rate: 25 mL/hr at 01/29/19 0640, 50 mcg/hr at 01/29/19 0640   pantoprazole (PROTONIX) 80 mg in sodium chloride 0.9 %  250 mL (0.32 mg/mL) infusion, 8 mg/hr, Intravenous, Continuous, Ojie, Jude, MD, Last Rate: 25 mL/hr at 01/29/19 0939, 8 mg/hr at 01/29/19 0939   sodium chloride flush (NS) 0.9 % injection 3 mL, 3 mL, Intravenous, Q12H, Ojie, Jude, MD, 3 mL at 01/29/19 0102   thiamine (VITAMIN B-1) tablet 100 mg, 100 mg, Oral, Daily, Ojie, Jude, MD, 100 mg at 01/28/19 2210   No family history on file.   Social History   Tobacco Use   Smoking status: Current Every Day Smoker   Smokeless tobacco: Never Used  Substance Use Topics    Alcohol use: Yes   Drug use: Not on file    Allergies as of 01/28/2019   (No Known Allergies)    Review of Systems:    All systems reviewed and negative except where noted in HPI.   Physical Exam:  Vital signs in last 24 hours: Temp:  [97.5 F (36.4 C)-98.4 F (36.9 C)] 97.5 F (36.4 C) (06/01 1035) Pulse Rate:  [81-116] 112 (06/01 1035) Resp:  [14-33] 18 (06/01 1035) BP: (71-105)/(26-84) 87/61 (06/01 1035) SpO2:  [77 %-100 %] 93 % (06/01 1035) Weight:  [70.2 kg-71.1 kg] 71.1 kg (06/01 0444) Last BM Date: 01/28/19 General:   Pleasant, cooperative in NAD Head:  Normocephalic and atraumatic. Eyes:   No icterus.   Conjunctiva pink. PERRLA. Ears:  Normal auditory acuity. Neck:  Supple; no masses or thyroidomegaly Lungs: Respirations even and unlabored. Lungs clear to auscultation bilaterally.   No wheezes, crackles, or rhonchi.  Heart:  Regular rate and rhythm;  Without murmur, clicks, rubs or gallops Abdomen:  Soft, distended, nontender. Normal bowel sounds. No appreciable masses or hepatomegaly.  No rebound or guarding.  Rectal:  Not performed. Msk:  Symmetrical without gross deformities.  Strength generalized weakness Extremities:  Without edema, cyanosis or clubbing. Neurologic:  Alert and oriented x3;  grossly normal neurologically. Skin:  Intact without significant lesions or rashes. Cervical Nodes:  No significant cervical adenopathy. Psych:  Alert and cooperative. Normal affect.  LAB RESULTS: CBC Latest Ref Rng & Units 01/29/2019 01/29/2019 01/28/2019  WBC 4.0 - 10.5 K/uL - 21.9(H) 25.8(H)  Hemoglobin 12.0 - 15.0 g/dL 6.9(L) 5.6(L) 4.0(LL)  Hematocrit 36.0 - 46.0 % 21.4(L) 16.6(L) 13.0(LL)  Platelets 150 - 400 K/uL - 199 288    BMET BMP Latest Ref Rng & Units 01/29/2019 01/28/2019 01/19/2019  Glucose 70 - 99 mg/dL 112(H) 114(H) 99  BUN 6 - 20 mg/dL 98(H) 94(H) 11  Creatinine 0.44 - 1.00 mg/dL 1.72(H) 1.62(H) 0.60  Sodium 135 - 145 mmol/L 130(L) 128(L) 127(L)    Potassium 3.5 - 5.1 mmol/L 4.1 4.3 3.8  Chloride 98 - 111 mmol/L 98 94(L) 96(L)  CO2 22 - 32 mmol/L 19(L) 15(L) 23  Calcium 8.9 - 10.3 mg/dL 7.8(L) 8.6(L) 8.6(L)    LFT Hepatic Function Latest Ref Rng & Units 01/28/2019 01/18/2019  Total Protein 6.5 - 8.1 g/dL 5.6(L) 7.0  Albumin 3.5 - 5.0 g/dL 3.3(L) 4.1  AST 15 - 41 U/L 69(H) 85(H)  ALT 0 - 44 U/L 30 31  Alk Phosphatase 38 - 126 U/L 196(H) 404(H)  Total Bilirubin 0.3 - 1.2 mg/dL 2.3(H) 2.0(H)  Bilirubin, Direct 0.0 - 0.2 mg/dL 1.2(H) -     STUDIES: Dg Chest Port 1 View  Result Date: 01/28/2019 CLINICAL DATA:  Shortness of breath EXAM: PORTABLE CHEST 1 VIEW COMPARISON:  01/18/2019 FINDINGS: Mild cardiomegaly. Lungs clear. No effusions or edema. No acute bony abnormality. IMPRESSION: Cardiomegaly.  No  active disease. Electronically Signed   By: Rolm Baptise M.D.   On: 01/28/2019 18:18      Impression / Plan:   Anita Reed is a 59 y.o. female with decompensated cirrhosis with ascites, now presents with active GI bleed with severe anemia  Melena Peptic ulcer disease or variceal bleed or portal hypertensive gastropathy or esophagitis Continue pantoprazole drip, octreotide drip Continue ceftriaxone for SBP prophylaxis Monitor CBC closely, goal hemoglobin >7, platelets> 50 Plan for EGD today  Ascites secondary to portal hypertension SAAG >1.1 Hold off on loop diuretics due to acute kidney injury and in the setting of upper GI bleed Strict low-sodium diet Restart loop diuretics upon discharge  Decompensated cirrhosis Hep C antibody positive Check HCV viral load Complete abstinence from alcohol   Thank you for involving me in the care of this patient. Will follow along with you     LOS: 1 day   Sherri Sear, MD  01/29/2019, 12:49 PM   Note: This dictation was prepared with Dragon dictation along with smaller phrase technology. Any transcriptional errors that result from this process are unintentional.

## 2019-01-29 NOTE — Progress Notes (Signed)
Advanced care plan. Purpose of the Encounter: CODE STATUS Parties in Attendance: Patient Patient's Decision Capacity: Good Dialysis at the subjective/Patient's story: Anita Reed  is a 58 y.o. female with a known history of liver cirrhosis, hypertension and remote history of lymphoma treated about 11 years ago who presented to the emergency room today with complaints of shortness of breath and generalized weakness.  Patient also reported noticing black stools at home.  Abdomen distended but not new due to underlying history of liver cirrhosis.  No abdominal pains.  No fevers.  No chest pain.  Patient reported to have intermittent episodes of confusion as well.  Patient's systolic blood pressure was initially in the upper 80s on presentation.  With IV fluid hydration in the emergency room recent systolic blood pressure had improved to 101.  Laboratory studies done in the emergency room significant for hemoglobin of 4.0.  Patient denies any vomiting.  Reported to be Hemoccult positive in the emergency room.  Evidence of acute kidney injury with creatinine of 1.62.  Patient diagnosed with GI bleed and 2 units of packed red blood cell transfusion was ordered in the emergency room.  Patient already started on octreotide drip and Protonix drip.  Medical service called to admit patient for further evaluation and management Objective/Medical story Patient needs gastroenterology evaluation for anemia and GI bleed Needs PRBC transfusions aggressively Will need endoscopy Goals of care determination:  Advance care directives goals of care and treatment plan discussed Patient wants CPR, intubation ventilator if the need arises CODE STATUS: Full code Time spent discussing advanced care planning: 16 minutes

## 2019-01-30 ENCOUNTER — Inpatient Hospital Stay: Payer: Medicare Other

## 2019-01-30 ENCOUNTER — Encounter: Payer: Self-pay | Admitting: Gastroenterology

## 2019-01-30 LAB — HEMOGLOBIN AND HEMATOCRIT, BLOOD
HCT: 30.1 % — ABNORMAL LOW (ref 36.0–46.0)
Hemoglobin: 9.8 g/dL — ABNORMAL LOW (ref 12.0–15.0)

## 2019-01-30 IMAGING — DX PORTABLE CHEST - 1 VIEW
1 series · 1 of 1 positions shown · non-contrast
Comparison: Chest radiograph dated [DATE]

CLINICAL DATA: 57-year-old female with shortness of breath.

EXAM:
PORTABLE CHEST 1 VIEW

[chest ap]
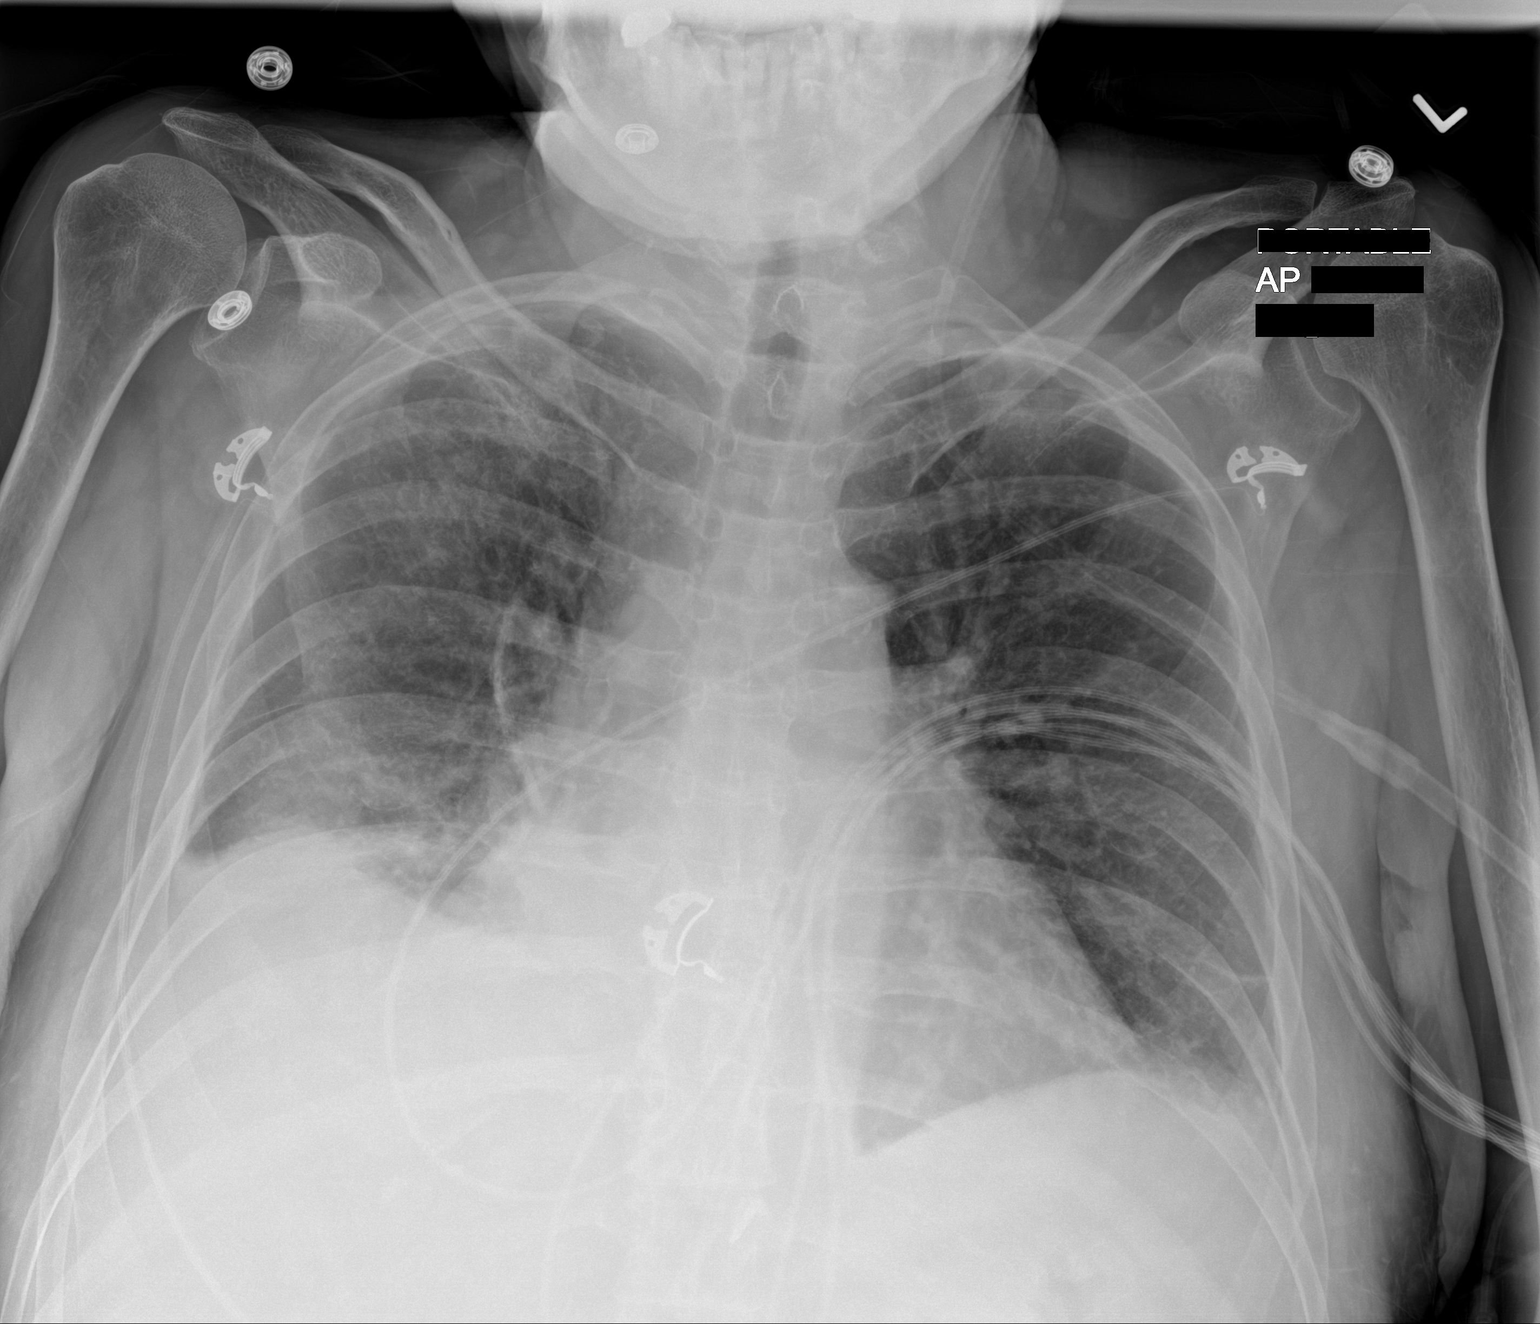

[1 of 1 positions shown; findings below may reference images not displayed]

FINDINGS: There is shallow inspiration. There is stable cardiomegaly with mild
vascular congestion and possible early edema. Small right and
possibly left pleural effusion. Focal area of density at the right
lung base may represent atelectatic changes although developing
infiltrate is not excluded. Clinical correlation is recommended. No
pneumothorax. No acute osseous pathology.
IMPRESSION: 1. Cardiomegaly with mild vascular congestion and possible early
edema.
2. Small bilateral pleural effusions and right lung base atelectasis
versus developing infiltrate. Overall no significant interval change
since the most recent prior exam.

## 2019-01-30 IMAGING — DX PORTABLE ABDOMEN - 1 VIEW
1 series · 1 of 1 positions shown · non-contrast
Comparison: None.

CLINICAL DATA: Ileus.

EXAM:
PORTABLE ABDOMEN - 1 VIEW

[abdomen supine]
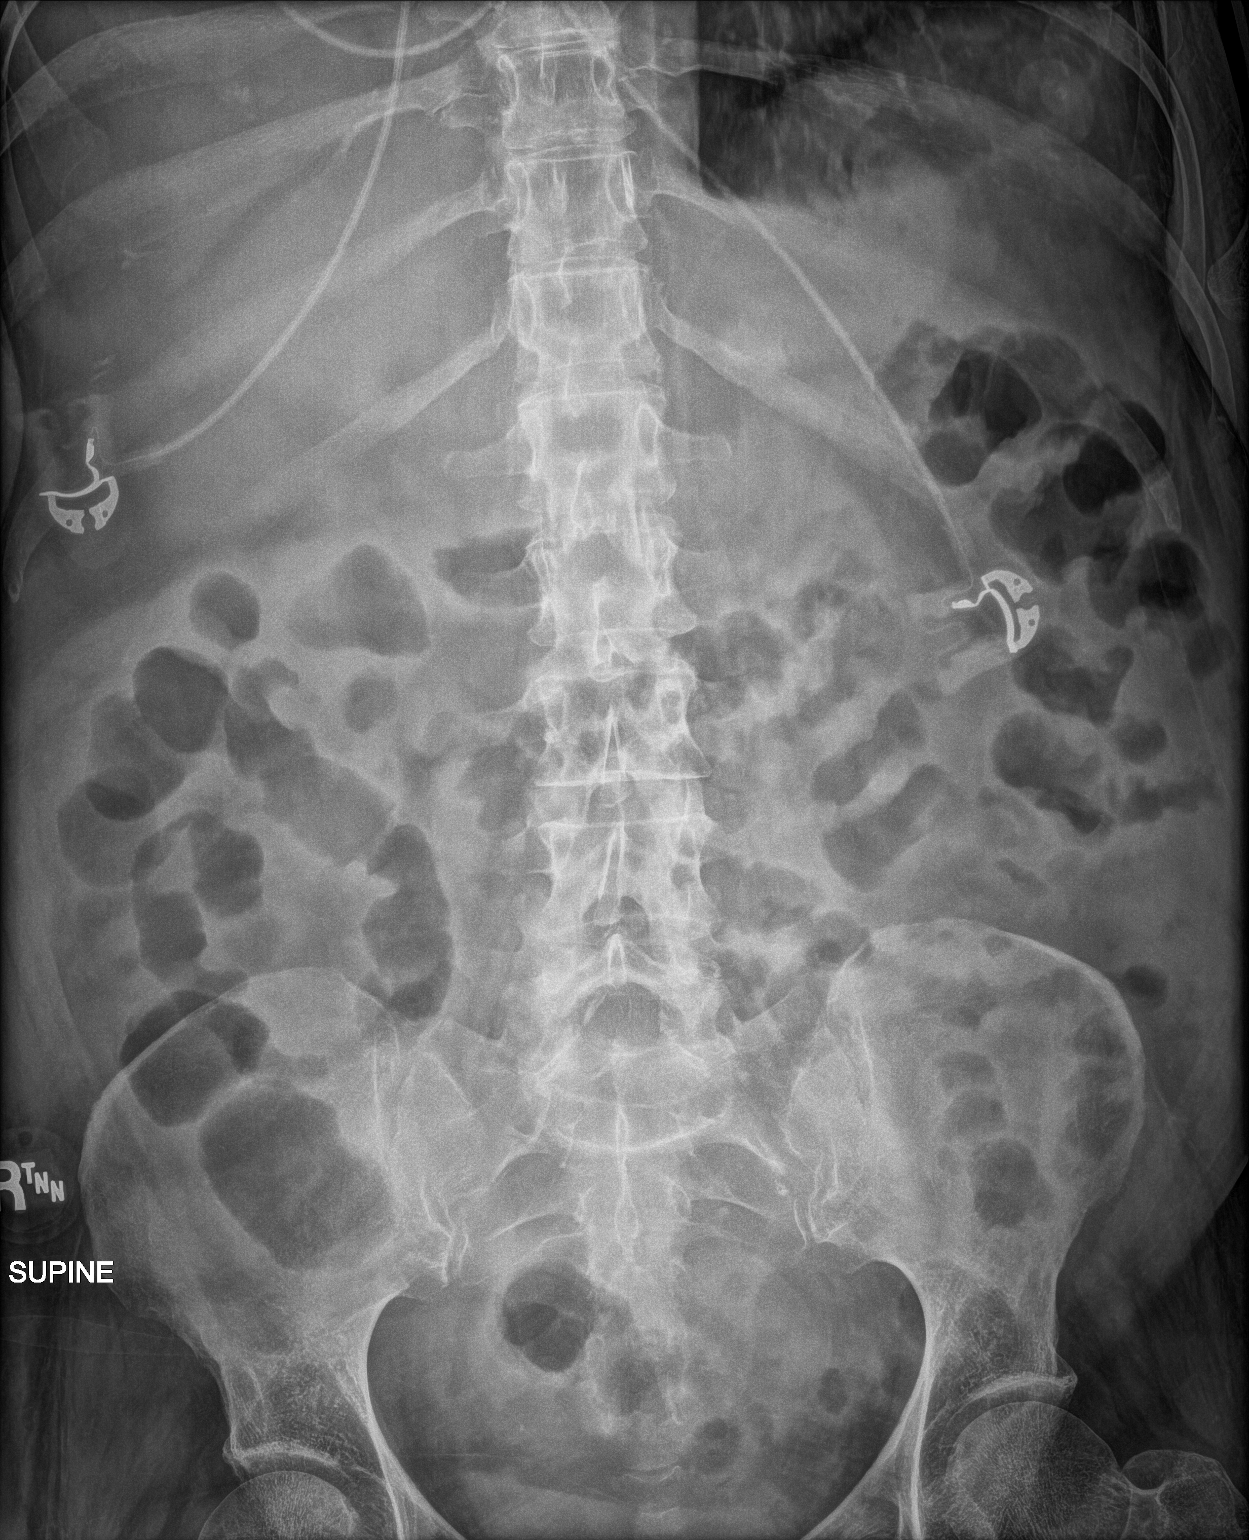

[1 of 1 positions shown; findings below may reference images not displayed]

FINDINGS: The bowel gas pattern is normal. No radio-opaque calculi or other
significant radiographic abnormality are seen.
IMPRESSION: No definite evidence of bowel obstruction or ileus.

## 2019-01-30 MED ORDER — SPIRONOLACTONE 25 MG PO TABS
100.0000 mg | ORAL_TABLET | Freq: Every day | ORAL | Status: DC
  Administered 2019-01-30 – 2019-01-31 (×2): 100 mg via ORAL
  Filled 2019-01-30 (×2): qty 4

## 2019-01-30 MED ORDER — NADOLOL 20 MG PO TABS
20.0000 mg | ORAL_TABLET | Freq: Every day | ORAL | Status: DC
  Administered 2019-01-30 – 2019-01-31 (×2): 20 mg via ORAL
  Filled 2019-01-30 (×2): qty 1

## 2019-01-30 MED ORDER — LEVALBUTEROL HCL 0.63 MG/3ML IN NEBU
0.6300 mg | INHALATION_SOLUTION | Freq: Four times a day (QID) | RESPIRATORY_TRACT | Status: DC | PRN
  Administered 2019-01-30: 0.63 mg via RESPIRATORY_TRACT
  Filled 2019-01-30: qty 3

## 2019-01-30 MED ORDER — FUROSEMIDE 10 MG/ML IJ SOLN
40.0000 mg | Freq: Two times a day (BID) | INTRAMUSCULAR | Status: DC
  Administered 2019-01-30 – 2019-01-31 (×2): 40 mg via INTRAVENOUS
  Filled 2019-01-30 (×2): qty 4

## 2019-01-30 MED ORDER — PANTOPRAZOLE SODIUM 40 MG IV SOLR
40.0000 mg | Freq: Two times a day (BID) | INTRAVENOUS | Status: DC
  Administered 2019-01-30 – 2019-01-31 (×3): 40 mg via INTRAVENOUS
  Filled 2019-01-30 (×3): qty 40

## 2019-01-30 MED ORDER — SPIRONOLACTONE 25 MG PO TABS: 100.0000 mg | ORAL_TABLET | Freq: Every day | ORAL | Status: DC

## 2019-01-30 MED ORDER — SPIRONOLACTONE 25 MG PO TABS: 50.0000 mg | ORAL_TABLET | Freq: Every day | ORAL | Status: DC

## 2019-01-30 NOTE — Progress Notes (Signed)
O2 sats on room air are 94 - 95%.  She is still appears to be dyspneic. When I ask her if she's having trouble breathing, she gives me strange answers, "it's because of the anxiety, it's because of being in bed, and something about her mother. RR is 24 - 26.

## 2019-01-30 NOTE — Progress Notes (Signed)
Cephas Darby, MD [address]  [place]  [place], [place] [address]  Main: [phone]  Fax: [phone] Pager: [phone]   Subjective: She reports feeling weak.  Denies shortness of breath.  She is asking if she can go home today.   Objective: Vital signs in last 24 hours: Vitals:   01/30/19 0332 01/30/19 0521 01/30/19 0756 01/30/19 1142  BP: 105/76 103/74 105/78   Pulse: (!) 118 (!) 119 74   Resp: (!) 24  20 (!) 22  Temp: 97.6 F (36.4 C) 97.6 F (36.4 C)    TempSrc: Oral Oral    SpO2: 99% 100% 93%   Weight:  70.2 kg    Height:       Weight change: 0.907 kg  Intake/Output Summary (Last 24 hours) at 01/30/2019 1218 Last data filed at 01/30/2019 1133 Gross per 24 hour  Intake 2303.14 ml  Output 4520 ml  Net -2216.86 ml     Exam: Heart:: Tachycardia, regular rhythm Lungs: clear to auscultation Abdomen: Soft, mildly distended, nontender   Lab Results: CBC Latest Ref Rng & Units 01/29/2019 01/29/2019 01/29/2019  WBC 4.0 - 10.5 K/uL - - 21.9(H)  Hemoglobin 12.0 - 15.0 g/dL 8.7(L) 6.9(L) 5.6(L)  Hematocrit 36.0 - 46.0 % 26.6(L) 21.4(L) 16.6(L)  Platelets 150 - 400 K/uL - - 199   CMP Latest Ref Rng & Units 01/29/2019 01/28/2019 01/19/2019  Glucose 70 - 99 mg/dL 112(H) 114(H) 99  BUN 6 - 20 mg/dL 98(H) 94(H) 11  Creatinine 0.44 - 1.00 mg/dL 1.72(H) 1.62(H) 0.60  Sodium 135 - 145 mmol/L 130(L) 128(L) 127(L)  Potassium 3.5 - 5.1 mmol/L 4.1 4.3 3.8  Chloride 98 - 111 mmol/L 98 94(L) 96(L)  CO2 22 - 32 mmol/L 19(L) 15(L) 23  Calcium 8.9 - 10.3 mg/dL 7.8(L) 8.6(L) 8.6(L)  Total Protein 6.5 - 8.1 g/dL - 5.6(L) -  Total Bilirubin 0.3 - 1.2 mg/dL - 2.3(H) -  Alkaline Phos 38 - 126 U/L - 196(H) -  AST 15 - 41 U/L - 69(H) -  ALT 0 - 44 U/L - 30 -    Micro Results: Recent Results (from the past 240 hour(s))  SARS Coronavirus 2 (CEPHEID - Performed in [place] hospital lab), Hosp Order     Status: None   Collection Time: 01/28/19  6:18 PM  Result Value Ref  Range Status   SARS Coronavirus 2 NEGATIVE NEGATIVE Final    Comment: (NOTE) If result is NEGATIVE SARS-CoV-2 target nucleic acids are NOT DETECTED. The SARS-CoV-2 RNA is generally detectable in upper and lower  respiratory specimens during the acute phase of infection. The lowest  concentration of SARS-CoV-2 viral copies this assay can detect is 250  copies / mL. A negative result does not preclude SARS-CoV-2 infection  and should not be used as the sole basis for treatment or other  patient management decisions.  A negative result may occur with  improper specimen collection / handling, submission of specimen other  than nasopharyngeal swab, presence of viral mutation(s) within the  areas targeted by this assay, and inadequate number of viral copies  (<250 copies / mL). A negative result must be combined with clinical  observations, patient history, and epidemiological information. If result is POSITIVE SARS-CoV-2 target nucleic acids are DETECTED. The SARS-CoV-2 RNA is generally detectable in upper and lower  respiratory specimens dur ing the acute phase of infection.  Positive  results are indicative of active infection with SARS-CoV-2.  Clinical  correlation with  patient history and other diagnostic information is  necessary to determine patient infection status.  Positive results do  not rule out bacterial infection or co-infection with other viruses. If result is PRESUMPTIVE POSTIVE SARS-CoV-2 nucleic acids MAY BE PRESENT.   A presumptive positive result was obtained on the submitted specimen  and confirmed on repeat testing.  While 2019 novel coronavirus  (SARS-CoV-2) nucleic acids may be present in the submitted sample  additional confirmatory testing may be necessary for epidemiological  and / or clinical management purposes  to differentiate between  SARS-CoV-2 and other Sarbecovirus currently known to infect humans.  If clinically indicated additional testing with an  alternate test  methodology [phone]) is advised. The SARS-CoV-2 RNA is generally  detectable in upper and lower respiratory sp ecimens during the acute  phase of infection. The expected result is Negative. Fact Sheet for Patients:  StrictlyIdeas.no Fact Sheet for Healthcare Providers: BankingDealers.co.za This test is not yet approved or cleared by the [place] FDA and has been authorized for detection and/or diagnosis of SARS-CoV-2 by FDA under an Emergency Use Authorization (EUA).  This EUA will remain in effect (meaning this test can be used) for the duration of the COVID-19 declaration under Section 564(b)(1) of the Act, 21 U.S.C. section 360bbb-3(b)(1), unless the authorization is terminated or revoked sooner. Performed at [facility], [address], [place], [place] [address]    Studies/Results: Dg Chest Port 1 View  Result Date: 01/29/2019 CLINICAL DATA:  Wheezing. EXAM: PORTABLE CHEST 1 VIEW COMPARISON:  01/28/2019 FINDINGS: The lung volumes are low. The cardiac silhouette is enlarged. There is developing pulmonary edema and small bilateral pleural effusions. There is no pneumothorax. No acute osseous abnormality. Bibasilar airspace opacities are favored to represent atelectasis. IMPRESSION: Cardiomegaly with developing pulmonary edema and small bilateral pleural effusions. Electronically Signed   By: Constance Holster M.D.   On: 01/29/2019 22:50   Dg Chest Port 1 View  Result Date: 01/28/2019 CLINICAL DATA:  Shortness of breath EXAM: PORTABLE CHEST 1 VIEW COMPARISON:  01/18/2019 FINDINGS: Mild cardiomegaly. Lungs clear. No effusions or edema. No acute bony abnormality. IMPRESSION: Cardiomegaly.  No active disease. Electronically Signed   By: Rolm Baptise M.D.   On: 01/28/2019 18:18   Medications:  I have reviewed the patient's current medications. Prior to Admission:  Medications Prior to Admission  Medication  Sig Dispense Refill Last Dose  . carvedilol (COREG) 3.125 MG tablet Take 1 tablet (3.125 mg total) by mouth 2 (two) times daily with a meal. 60 tablet 2 01/28/2019 at Unknown time  . furosemide (LASIX) 40 MG tablet Take 1 tablet (40 mg total) by mouth daily. 30 tablet 3   . spironolactone (ALDACTONE) 50 MG tablet Take 1 tablet (50 mg total) by mouth daily. 30 tablet 3    Scheduled: . sodium chloride   Intravenous Once  . feeding supplement  1 Container Oral TID BM  . folic acid  1 mg Oral Daily  . furosemide  40 mg Intravenous BID  . mouth rinse  15 mL Mouth Rinse BID  . multivitamin with minerals  1 tablet Oral Daily  . pantoprazole (PROTONIX) IV  40 mg Intravenous Q12H  . sodium chloride flush  3 mL Intravenous Q12H  . spironolactone  100 mg Oral Daily  . thiamine  100 mg Oral Daily   Continuous: . sodium chloride 0 mL/hr at 01/29/19 0223  . cefTRIAXone (ROCEPHIN)  IV 1 g (01/29/19 2143)   WOE:HOZYYQ chloride, levalbuterol, LORazepam,  morphine injection, simethicone Anti-infectives (From admission, onward)   Start     Dose/Rate Route Frequency Ordered Stop   01/28/19 1930  cefTRIAXone (ROCEPHIN) 1 g in sodium chloride 0.9 % 100 mL IVPB     1 g 200 mL/hr over 30 Minutes Intravenous Every 24 hours 01/28/19 1929       Scheduled Meds: . sodium chloride   Intravenous Once  . feeding supplement  1 Container Oral TID BM  . folic acid  1 mg Oral Daily  . furosemide  40 mg Intravenous BID  . mouth rinse  15 mL Mouth Rinse BID  . multivitamin with minerals  1 tablet Oral Daily  . pantoprazole (PROTONIX) IV  40 mg Intravenous Q12H  . sodium chloride flush  3 mL Intravenous Q12H  . spironolactone  100 mg Oral Daily  . thiamine  100 mg Oral Daily   Continuous Infusions: . sodium chloride 0 mL/hr at 01/29/19 0223  . cefTRIAXone (ROCEPHIN)  IV 1 g (01/29/19 2143)   PRN Meds:.sodium chloride, levalbuterol, LORazepam, morphine injection, simethicone   Assessment: Active Problems:    GI bleed Decompensated hepatitis C, alcoholic cirrhosis with ascites Patient underwent EGD yesterday and found to have bleeding Dieulafoy's lesion which was clipped   Plan: Discontinue octreotide drip Continue Protonix 40 mg twice daily Resume low-sodium diet Continue Lasix 40 mg twice daily Start spironolactone 100 mg daily Recommend to start nadolol or propranolol for variceal bleeding prophylaxis Follow-up with GI in 1 to 2 weeks after discharge Check BMP tomorrow as creatinine is rising Complete abstinence from alcohol Hep C treatment as outpatient   LOS: 2 days   Rohini Vanga 01/30/2019, 12:18 PM

## 2019-01-30 NOTE — Progress Notes (Signed)
[place] [facility] [place] NAME: Anita Reed    MR#:  [id number]  DATE OF BIRTH:  1960/09/02  SUBJECTIVE:  CHIEF COMPLAINT:   Chief Complaint  Patient presents with  . Weakness  . Shortness of Breath  Patient seen and evaluated today Has generalized weakness and fatigue Patient became short of breath and wheezy last night She was volume overloaded She was diuresed with IV Lasix drip Status post endoscopy and tolerated procedure well Tolerated PRBC transfusions well Currently on clear liquid diet  REVIEW OF SYSTEMS:    ROS  CONSTITUTIONAL: No documented fever. Has fatigue, weakness. No weight gain, no weight loss.  EYES: No blurry or double vision.  ENT: No tinnitus. No postnasal drip. No redness of the oropharynx.  RESPIRATORY: No cough, no wheeze, no hemoptysis.  Decreased dyspnea.  CARDIOVASCULAR: No chest pain. No orthopnea. No palpitations. No syncope.  GASTROINTESTINAL: No nausea, no vomiting or diarrhea. No abdominal pain. No melena or hematochezia.  GENITOURINARY: No dysuria or hematuria.  ENDOCRINE: No polyuria or nocturia. No heat or cold intolerance.  HEMATOLOGY: No anemia. No bruising. No bleeding.  INTEGUMENTARY: No rashes. No lesions.  MUSCULOSKELETAL: No arthritis. No swelling. No gout.  NEUROLOGIC: No numbness, tingling, or ataxia. No seizure-type activity.  PSYCHIATRIC: No anxiety. No insomnia. No ADD.   DRUG ALLERGIES:  No Known Allergies  VITALS:  Blood pressure 105/78, pulse 74, temperature 97.6 F (36.4 C), temperature source Oral, resp. rate 20, height 5\' 9"  (1.753 m), weight 70.2 kg, SpO2 93 %.  PHYSICAL EXAMINATION:   Physical Exam  GENERAL:  58 y.o.-year-old patient lying in the bed with no acute distress.  EYES: Pupils equal, round, reactive to light and accommodation. No scleral icterus. Extraocular muscles intact.  HEENT: Head atraumatic, normocephalic. Oropharynx and nasopharynx clear.  Pallor  present NECK:  Supple, no jugular venous distention. No thyroid enlargement, no tenderness.  LUNGS: Decreased breath sounds bilaterally, mild wheezing, bibasilar crepitations heard. No use of accessory muscles of respiration.  CARDIOVASCULAR: S1, S2 normal. No murmurs, rubs, or gallops.  ABDOMEN: Soft, nontender, nondistended. Bowel sounds present. No organomegaly or mass.  EXTREMITIES: No cyanosis, clubbing or edema b/l.    NEUROLOGIC: Cranial nerves II through XII are intact. No focal Motor or sensory deficits b/l.   PSYCHIATRIC: The patient is alert and oriented x 3.  SKIN: No obvious rash, lesion, or ulcer.   LABORATORY PANEL:   CBC Recent Labs  Lab 01/29/19 0157  01/29/19 1847  WBC 21.9*  --   --   HGB 5.6*   < > 8.7*  HCT 16.6*   < > 26.6*  PLT 199  --   --    < > = values in this interval not displayed.   ------------------------------------------------------------------------------------------------------------------ Chemistries  Recent Labs  Lab 01/28/19 1656 01/29/19 0157  NA 128* 130*  K 4.3 4.1  CL 94* 98  CO2 15* 19*  GLUCOSE 114* 112*  BUN 94* 98*  CREATININE 1.62* 1.72*  CALCIUM 8.6* 7.8*  MG  --  2.1  AST 69*  --   ALT 30  --   ALKPHOS 196*  --   BILITOT 2.3*  --    ------------------------------------------------------------------------------------------------------------------  Cardiac Enzymes Recent Labs  Lab 01/28/19 1656  TROPONINI <0.03   ------------------------------------------------------------------------------------------------------------------  RADIOLOGY:  Dg Chest Port 1 View  Result Date: 01/29/2019 CLINICAL DATA:  Wheezing. EXAM: PORTABLE CHEST 1 VIEW COMPARISON:  01/28/2019 FINDINGS: The lung volumes are low. The cardiac  silhouette is enlarged. There is developing pulmonary edema and small bilateral pleural effusions. There is no pneumothorax. No acute osseous abnormality. Bibasilar airspace opacities are favored to represent  atelectasis. IMPRESSION: Cardiomegaly with developing pulmonary edema and small bilateral pleural effusions. Electronically Signed   By: Constance Holster M.D.   On: 01/29/2019 22:50   Dg Chest Port 1 View  Result Date: 01/28/2019 CLINICAL DATA:  Shortness of breath EXAM: PORTABLE CHEST 1 VIEW COMPARISON:  01/18/2019 FINDINGS: Mild cardiomegaly. Lungs clear. No effusions or edema. No acute bony abnormality. IMPRESSION: Cardiomegaly.  No active disease. Electronically Signed   By: Rolm Baptise M.D.   On: 01/28/2019 18:18     ASSESSMENT AND PLAN:  58 year old female patient with history of hypertension, cirrhosis of liver, history of lymphoma currently in the hospitalist service for anemia and dark stool  -Acute gastrointestinal bleeding Status post endoscopy IV fluids discontinued secondary to volume overload IV Protonix IV octreotide drip PRBC transfusions tolerated well Currently on IV Rocephin antibiotic for SBP prophylaxis Endoscopy revealed portal hypertensive gastropathy Diuelfoy lesion  -Acute blood loss anemia PRBC transfusion and hemoglobin hematocrit checkup  -Bilateral pleural effusion volume overload Diuresis with IV Lasix Discontinue IV fluids  -Acute hyponatremia improved  -History of cirrhosis of liver Supportive care  -Acute kidney injury Monitor renal function Avoid nephrotoxic medication  -History of lymphoma Supportive care   All the records are reviewed and case discussed with Care Management/Social Worker. Management plans discussed with the patient, family and they are in agreement.  CODE STATUS: Full code  DVT Prophylaxis: SCDs  TOTAL TIME TAKING CARE OF THIS PATIENT: 40 minutes.   POSSIBLE D/C IN 2 to 3 DAYS, DEPENDING ON CLINICAL CONDITION.  Saundra Shelling M.D on 01/30/2019 at 10:25 AM  Between 7am to 6pm - Pager - [phone]  After 6pm go to [email or web address] - password [email or web address] [facility]  Office   [phone]  CC: Primary care physician; Patient, No Pcp Per  Note: This dictation was prepared with Dragon dictation along with smaller phrase technology. Any transcriptional errors that result from this process are unintentional.

## 2019-01-30 NOTE — Progress Notes (Signed)
MD notified of pt wheezing and increased HR. Per MD okay to give xopenex q6h PRN. 1 dose given and wheezing/SOB is better. Will continue to monitor.

## 2019-01-31 DIAGNOSIS — I8511 Secondary esophageal varices with bleeding: Secondary | ICD-10-CM

## 2019-01-31 DIAGNOSIS — K922 Gastrointestinal hemorrhage, unspecified: Secondary | ICD-10-CM

## 2019-01-31 DIAGNOSIS — I483 Typical atrial flutter: Secondary | ICD-10-CM

## 2019-01-31 DIAGNOSIS — K766 Portal hypertension: Secondary | ICD-10-CM

## 2019-01-31 DIAGNOSIS — I4891 Unspecified atrial fibrillation: Secondary | ICD-10-CM

## 2019-01-31 DIAGNOSIS — K7031 Alcoholic cirrhosis of liver with ascites: Secondary | ICD-10-CM

## 2019-01-31 DIAGNOSIS — D649 Anemia, unspecified: Secondary | ICD-10-CM

## 2019-01-31 LAB — TYPE AND SCREEN
ABO/RH(D): O POS
Antibody Screen: NEGATIVE
Unit division: 0
Unit division: 0
Unit division: 0
Unit division: 0

## 2019-01-31 LAB — BASIC METABOLIC PANEL
Anion gap: 7 (ref 5–15)
BUN: 39 mg/dL — ABNORMAL HIGH (ref 6–20)
CO2: 22 mmol/L (ref 22–32)
Calcium: 8.2 mg/dL — ABNORMAL LOW (ref 8.9–10.3)
Chloride: 108 mmol/L (ref 98–111)
Creatinine, Ser: 0.7 mg/dL (ref 0.44–1.00)
GFR calc Af Amer: >60 mL/min (ref >60)
GFR calc non Af Amer: >60 mL/min (ref >60)
Glucose, Bld: 112 mg/dL — ABNORMAL HIGH (ref 70–99)
Potassium: 2.8 mmol/L — ABNORMAL LOW (ref 3.5–5.1)
Sodium: 137 mmol/L (ref 135–145)

## 2019-01-31 LAB — BPAM RBC
Blood Product Expiration Date: 202006052359
Blood Product Expiration Date: 202006202359
Blood Product Expiration Date: 202006302359
Blood Product Expiration Date: 202007012359
ISSUE DATE / TIME: 202005311737
ISSUE DATE / TIME: 202005311840
ISSUE DATE / TIME: 202006010710
ISSUE DATE / TIME: 202006011347
Unit Type and Rh: 5100
Unit Type and Rh: 5100
Unit Type and Rh: 5100
Unit Type and Rh: 5100

## 2019-01-31 LAB — CBC
HCT: 26.1 % — ABNORMAL LOW (ref 36.0–46.0)
Hemoglobin: 8.6 g/dL — ABNORMAL LOW (ref 12.0–15.0)
MCH: 28.1 pg (ref 26.0–34.0)
MCHC: 33 g/dL (ref 30.0–36.0)
MCV: 85.3 fL (ref 80.0–100.0)
Platelets: 181 10*3/uL (ref 150–400)
RBC: 3.06 MIL/uL — ABNORMAL LOW (ref 3.87–5.11)
RDW: 18.3 % — ABNORMAL HIGH (ref 11.5–15.5)
WBC: 19 10*3/uL — ABNORMAL HIGH (ref 4.0–10.5)
nRBC: 0.1 % (ref 0.0–0.2)

## 2019-01-31 LAB — PREPARE RBC (CROSSMATCH)

## 2019-01-31 MED ORDER — FOLIC ACID 1 MG PO TABS: 1.0000 mg | ORAL_TABLET | Freq: Every day | ORAL | 0 refills | Status: DC

## 2019-01-31 MED ORDER — FUROSEMIDE 40 MG PO TABS: 40.0000 mg | ORAL_TABLET | Freq: Two times a day (BID) | ORAL | 3 refills | Status: DC

## 2019-01-31 MED ORDER — RIFAXIMIN 200 MG PO TABS: 200.0000 mg | ORAL_TABLET | Freq: Three times a day (TID) | ORAL | 0 refills | Status: DC

## 2019-01-31 MED ORDER — POTASSIUM CHLORIDE CRYS ER 20 MEQ PO TBCR
40.0000 meq | EXTENDED_RELEASE_TABLET | ORAL | Status: AC
  Administered 2019-01-31 (×2): 40 meq via ORAL
  Filled 2019-01-31 (×2): qty 2

## 2019-01-31 MED ORDER — PANTOPRAZOLE SODIUM 40 MG PO TBEC: 40.0000 mg | DELAYED_RELEASE_TABLET | Freq: Two times a day (BID) | ORAL | 11 refills | Status: DC

## 2019-01-31 MED ORDER — NADOLOL 20 MG PO TABS: 20.0000 mg | ORAL_TABLET | Freq: Every day | ORAL | 0 refills | Status: DC

## 2019-01-31 MED ORDER — THIAMINE HCL 100 MG PO TABS: 100.0000 mg | ORAL_TABLET | Freq: Every day | ORAL | 0 refills | Status: AC

## 2019-01-31 MED ORDER — SPIRONOLACTONE 50 MG PO TABS: 150.0000 mg | ORAL_TABLET | Freq: Every day | ORAL | 3 refills | Status: DC

## 2019-01-31 MED ORDER — DIGOXIN 0.25 MG/ML IJ SOLN
0.2500 mg | Freq: Once | INTRAMUSCULAR | Status: AC
  Administered 2019-01-31: 0.25 mg via INTRAVENOUS
  Filled 2019-01-31: qty 2

## 2019-01-31 MED ORDER — METOPROLOL TARTRATE 5 MG/5ML IV SOLN
5.0000 mg | Freq: Once | INTRAVENOUS | Status: DC
  Filled 2019-01-31: qty 5

## 2019-01-31 MED ORDER — POTASSIUM CHLORIDE CRYS ER 20 MEQ PO TBCR
40.0000 meq | EXTENDED_RELEASE_TABLET | Freq: Once | ORAL | Status: AC
  Administered 2019-01-31: 40 meq via ORAL
  Filled 2019-01-31: qty 2

## 2019-01-31 MED ORDER — POTASSIUM CHLORIDE ER 20 MEQ PO TBCR: EXTENDED_RELEASE_TABLET | ORAL | 1 refills | Status: DC

## 2019-01-31 MED ORDER — CIPROFLOXACIN HCL 500 MG PO TABS: 500.0000 mg | ORAL_TABLET | Freq: Two times a day (BID) | ORAL | 0 refills | Status: AC

## 2019-01-31 NOTE — Progress Notes (Signed)
Called her husband, reviewed the AVS with him. Told him to call me if he has any questions after reviewing it.  He is on his way and will be here in 15 - 20 minutes to get her.

## 2019-01-31 NOTE — TOC Transition Note (Signed)
Transition of Care [facility]) - CM/SW Discharge Note   Patient Details  Name: Anita Reed MRN: [id number] Date of Birth: 07/27/1961  Transition of Care [facility]) CM/SW Contact:  Ross Ludwig, LCSW Phone Number: 01/31/2019, 6:39 PM   Clinical Narrative:     CSW received consult that patient does not have a PCP.  CSW discussed with patient importance of having a PCP to help with her health needs.  Patient stated she needs to stop drinking, smoking, and needs to start eating better.  Patient was given contact information for [facility], nursing secretary to try to set up appointment for patient.  Patient did not express any other needs for equipment or home health.  CSW signing off, patient to discharge back home via her husband.   Final next level of care: Home/Self Care Barriers to Discharge: No Barriers Identified   Patient Goals and CMS Choice Patient states their goals for this hospitalization and ongoing recovery are:: To return back home.   Choice offered to / list presented to : NA  Discharge Placement      To return back home with her husband.                 Discharge Plan and Services                DME Arranged: N/A DME Agency: NA       HH Arranged: NA HH Agency: NA        Social Determinants of Health (SDOH) Interventions     Readmission Risk Interventions No flowsheet data found.

## 2019-01-31 NOTE — Progress Notes (Signed)
Spoke to her husband regarding discharge.  He has requested to find her a PCP.

## 2019-01-31 NOTE — Discharge Summary (Addendum)
[place] [facility] [place] NAME: Anita Reed    MR#:  [id number]  DATE OF BIRTH:  09-04-1960  DATE OF ADMISSION:  01/28/2019 ADMITTING PHYSICIAN: Otila Back, MD  DATE OF DISCHARGE: 01/31/2019  PRIMARY CARE PHYSICIAN: Patient, No Pcp Per   ADMISSION DIAGNOSIS:  Weakness [R53.1] SOB (shortness of breath) [R06.02] Gastrointestinal hemorrhage, unspecified gastrointestinal hemorrhage type [K92.2] Anemia, unspecified type [D64.9]  DISCHARGE DIAGNOSIS:  Active Problems:   GI bleed Anemia History of lymphoma Cirrhosis of liver Acute kidney injury Acute hypokalemia  SECONDARY DIAGNOSIS:   Past Medical History:  Diagnosis Date  . Cancer ([place])   . Hypertension      ADMITTING HISTORY Anita Reed  is a 58 y.o. female with a known history of liver cirrhosis, hypertension and remote history of lymphoma treated about 11 years ago who presented to the emergency room today with complaints of shortness of breath and generalized weakness.  Patient also reported noticing black stools at home.  Abdomen distended but not new due to underlying history of liver cirrhosis.  No abdominal pains.  No fevers.  No chest pain.  Patient reported to have intermittent episodes of confusion as well.  Patient's systolic blood pressure was initially in the upper 80s on presentation.  With IV fluid hydration in the emergency room recent systolic blood pressure had improved to 101.  Laboratory studies done in the emergency room significant for hemoglobin of 4.0.  Patient denies any vomiting.  Reported to be Hemoccult positive in the emergency room.  Evidence of acute kidney injury with creatinine of 1.62.  Patient diagnosed with GI bleed and 2 units of packed red blood cell transfusion was ordered in the emergency room.  Patient already started on octreotide drip and Protonix drip.  Medical service called to admit patient for further evaluation and management.   HOSPITAL COURSE:   Patient was admitted to medical floor.  Patient was started on IV octreotide drip and Protonix drip.  At the time of admission patient's hemoglobin was 4.  Patient was transfused with PRBC intravenously and her hemoglobin improved.  Hemoglobin at the time of discharge is greater than 8.  Patient received IV fluids for acute kidney injury.  After PRBC transfusion she was volume overloaded and received Lasix via drip for diuresis.  Patient had upper endoscopy by gastroenterology.  Endoscopy revealed portal hypertensive gastropathy and diuelfoy lesion patient was started on IV Rocephin antibiotic during the stay in the hospital for SBP prophylaxis her leukocytosis improved.  She was started on Lasix, Aldactone.  Patient will be discharged home on Aldactone, Lasix and Xifaxan and will continue ciprofloxacin antibiotic.  Patient will follow-up with gastroenterology in the clinic in 2 weeks.  Potassium has been aggressively supplemented during the stay in the hospital.  She was given potassium orally even prior to discharge.  CONSULTS OBTAINED:  Treatment Team:  [name], MD Minna Merritts, MD  DRUG ALLERGIES:  No Known Allergies  DISCHARGE MEDICATIONS:   Allergies as of 01/31/2019   No Known Allergies     Medication List    STOP taking these medications   carvedilol 3.125 MG tablet Commonly known as:  COREG     TAKE these medications   ciprofloxacin 500 MG tablet Commonly known as:  Cipro Take 1 tablet (500 mg total) by mouth 2 (two) times daily for 5 days.   folic acid 1 MG tablet Commonly known as:  FOLVITE Take 1 tablet (1 mg total)  by mouth daily for 30 days. Start taking on:  February 01, 2019   furosemide 40 MG tablet Commonly known as:  Lasix Take 1 tablet (40 mg total) by mouth 2 (two) times daily. What changed:  when to take this   nadolol 20 MG tablet Commonly known as:  CORGARD Take 1 tablet (20 mg total) by mouth daily for 30 days. Start taking on:  February 01, 2019   pantoprazole 40 MG tablet Commonly known as:  Protonix Take 1 tablet (40 mg total) by mouth 2 (two) times daily.   Potassium Chloride ER 20 MEQ Tbcr Commonly known as:  Klor-Con 10 Take one tablet twice daily   rifaximin 200 MG tablet Commonly known as:  Xifaxan Take 1 tablet (200 mg total) by mouth 3 (three) times daily for 30 days.   spironolactone 50 MG tablet Commonly known as:  ALDACTONE Take 3 tablets (150 mg total) by mouth daily. What changed:  how much to take   thiamine 100 MG tablet Take 1 tablet (100 mg total) by mouth daily for 30 days. Start taking on:  February 01, 2019       Today  Patient seen today No complaints of abdominal pain No vomiting of blood or bleeding per rectum Tolerated diet okay Will be discharged home Discussed with gastroenterology Hemodynamically stable  VITAL SIGNS:  Blood pressure 103/71, pulse (!) 118, temperature 98.5 F (36.9 C), temperature source Oral, resp. rate 20, height 5\' 9"  (1.753 m), weight 72.8 kg, SpO2 95 %.  I/O:    Intake/Output Summary (Last 24 hours) at 01/31/2019 1313 Last data filed at 01/31/2019 1052 Gross per 24 hour  Intake 282.88 ml  Output 2225 ml  Net -1942.12 ml    PHYSICAL EXAMINATION:  Physical Exam  GENERAL:  58 y.o.-year-old patient lying in the bed with no acute distress.  LUNGS: Normal breath sounds bilaterally, no wheezing, rales,rhonchi or crepitation. No use of accessory muscles of respiration.  CARDIOVASCULAR: S1, S2 normal. No murmurs, rubs, or gallops.  ABDOMEN: Soft, non-tender, non-distended. Bowel sounds present. No organomegaly or mass.  NEUROLOGIC: Moves all 4 extremities. PSYCHIATRIC: The patient is alert and oriented x 3.  SKIN: No obvious rash, lesion, or ulcer.   DATA REVIEW:   CBC Recent Labs  Lab 01/31/19 0218  WBC 19.0*  HGB 8.6*  HCT 26.1*  PLT 181    Chemistries  Recent Labs  Lab 01/28/19 1656 01/29/19 0157 01/31/19 0218  NA 128* 130* 137  K 4.3 4.1  2.8*  CL 94* 98 108  CO2 15* 19* 22  GLUCOSE 114* 112* 112*  BUN 94* 98* 39*  CREATININE 1.62* 1.72* 0.70  CALCIUM 8.6* 7.8* 8.2*  MG  --  2.1  --   AST 69*  --   --   ALT 30  --   --   ALKPHOS 196*  --   --   BILITOT 2.3*  --   --     Cardiac Enzymes Recent Labs  Lab 01/28/19 1656  TROPONINI <0.03    Microbiology Results  Results for orders placed or performed during the hospital encounter of 01/28/19  SARS Coronavirus 2 (CEPHEID - Performed in [place] hospital lab), Hosp Order     Status: None   Collection Time: 01/28/19  6:18 PM  Result Value Ref Range Status   SARS Coronavirus 2 NEGATIVE NEGATIVE Final    Comment: (NOTE) If result is NEGATIVE SARS-CoV-2 target nucleic acids are NOT DETECTED. The SARS-CoV-2  RNA is generally detectable in upper and lower  respiratory specimens during the acute phase of infection. The lowest  concentration of SARS-CoV-2 viral copies this assay can detect is 250  copies / mL. A negative result does not preclude SARS-CoV-2 infection  and should not be used as the sole basis for treatment or other  patient management decisions.  A negative result may occur with  improper specimen collection / handling, submission of specimen other  than nasopharyngeal swab, presence of viral mutation(s) within the  areas targeted by this assay, and inadequate number of viral copies  (<250 copies / mL). A negative result must be combined with clinical  observations, patient history, and epidemiological information. If result is POSITIVE SARS-CoV-2 target nucleic acids are DETECTED. The SARS-CoV-2 RNA is generally detectable in upper and lower  respiratory specimens dur ing the acute phase of infection.  Positive  results are indicative of active infection with SARS-CoV-2.  Clinical  correlation with patient history and other diagnostic information is  necessary to determine patient infection status.  Positive results do  not rule out bacterial  infection or co-infection with other viruses. If result is PRESUMPTIVE POSTIVE SARS-CoV-2 nucleic acids MAY BE PRESENT.   A presumptive positive result was obtained on the submitted specimen  and confirmed on repeat testing.  While 2019 novel coronavirus  (SARS-CoV-2) nucleic acids may be present in the submitted sample  additional confirmatory testing may be necessary for epidemiological  and / or clinical management purposes  to differentiate between  SARS-CoV-2 and other Sarbecovirus currently known to infect humans.  If clinically indicated additional testing with an alternate test  methodology [phone]) is advised. The SARS-CoV-2 RNA is generally  detectable in upper and lower respiratory sp ecimens during the acute  phase of infection. The expected result is Negative. Fact Sheet for Patients:  StrictlyIdeas.no Fact Sheet for Healthcare Providers: BankingDealers.co.za This test is not yet approved or cleared by the [place] FDA and has been authorized for detection and/or diagnosis of SARS-CoV-2 by FDA under an Emergency Use Authorization (EUA).  This EUA will remain in effect (meaning this test can be used) for the duration of the COVID-19 declaration under Section 564(b)(1) of the Act, 21 U.S.C. section 360bbb-3(b)(1), unless the authorization is terminated or revoked sooner. Performed at [facility], [address] [place], [address] [address]     RADIOLOGY:  Dg Chest Port 1 View  Result Date: 01/31/2019 CLINICAL DATA:  58 year old female with shortness of breath. EXAM: PORTABLE CHEST 1 VIEW COMPARISON:  Chest radiograph dated 01/29/2019 FINDINGS: There is shallow inspiration. There is stable cardiomegaly with mild vascular congestion and possible early edema. Small right and possibly left pleural effusion. Focal area of density at the right lung base may represent atelectatic changes although developing infiltrate  is not excluded. Clinical correlation is recommended. No pneumothorax. No acute osseous pathology. IMPRESSION: 1. Cardiomegaly with mild vascular congestion and possible early edema. 2. Small bilateral pleural effusions and right lung base atelectasis versus developing infiltrate. Overall no significant interval change since the most recent prior exam. Electronically Signed   By: Anner Crete M.D.   On: 01/31/2019 00:44   Dg Chest Port 1 View  Result Date: 01/29/2019 CLINICAL DATA:  Wheezing. EXAM: PORTABLE CHEST 1 VIEW COMPARISON:  01/28/2019 FINDINGS: The lung volumes are low. The cardiac silhouette is enlarged. There is developing pulmonary edema and small bilateral pleural effusions. There is no pneumothorax. No acute osseous abnormality. Bibasilar airspace opacities are favored  to represent atelectasis. IMPRESSION: Cardiomegaly with developing pulmonary edema and small bilateral pleural effusions. Electronically Signed   By: Constance Holster M.D.   On: 01/29/2019 22:50   Dg Abd Portable 1v  Result Date: 01/30/2019 CLINICAL DATA:  Ileus. EXAM: PORTABLE ABDOMEN - 1 VIEW COMPARISON:  None. FINDINGS: The bowel gas pattern is normal. No radio-opaque calculi or other significant radiographic abnormality are seen. IMPRESSION: No definite evidence of bowel obstruction or ileus. Electronically Signed   By: Marijo Conception M.D.   On: 01/30/2019 18:17    Follow up with PCP in 1 week.  Management plans discussed with the patient, family and they are in agreement.  CODE STATUS: Full code    Code Status Orders  (From admission, onward)         Start     Ordered   01/28/19 1920  Full code  Continuous     01/28/19 1920        Code Status History    Date Active Date Inactive Code Status Order ID Comments User Context   01/18/2019 1651 01/19/2019 1946 DNR [id number]  Otila Back, MD ED      TOTAL TIME TAKING CARE OF THIS PATIENT ON DAY OF DISCHARGE: more than 35 minutes.   Saundra Shelling M.D  on 01/31/2019 at 1:13 PM  Between 7am to 6pm - Pager - [phone]  After 6pm go to [email or web address] - password [email or web address] [facility]  Office  [phone]  CC: Primary care physician; Patient, No Pcp Per  Note: This dictation was prepared with Dragon dictation along with smaller phrase technology. Any transcriptional errors that result from this process are unintentional.

## 2019-01-31 NOTE — Progress Notes (Signed)
Pt had an event of A-fib with RVR. Pt is still SOB. MD notified. Orders for EKG and chest x ray. Pt converted back to Sinus Tach. Orders for iV metoprolol x 1. Pt was getting very agitated so a PRN dose of ativan was given first. After reassessing her, BP is 89/58. Per MD, okay to hold dose of metoprolol for now since pt is back in ST hr 120. Will continue to monitor.

## 2019-01-31 NOTE — Consult Note (Addendum)
Cardiology Consultation:   Patient ID: Anita Reed MRN: [id number]; DOB: 06/18/1961  Admit date: 01/28/2019 Date of Consult: 01/31/2019  Primary Care Provider: Patient, No Pcp Per Primary Cardiologist: [facility] - Dr. Rockey Situ Primary Electrophysiologist:  None    Patient Profile:   Marisel Tostenson is a 58 y.o. female with a hx of hypertension, alcohol abuse and hepatitis C with decompensated liver cirrhosis / severe portal hypertensive gastropathy, and esophageal varices, current tobacco use, medical noncompliance, and remote history of lymphoma who is being seen today for the evaluation of paroxysmal atrial fibrillation in the setting of GI bleed s/p transfusion and at the request of Dr. Estanislado Pandy.  History of Present Illness:   Ms. Blecha is a 58 year old female with previous history of paroxysmal atrial flutter taht was first noted at her 01/18/2019 admission as outlined below.  She does have a history of alcohol abuse and reported 3 glasses of wine at least 3 times weekly. She also currently uses tobacco.  On 01/18/2019, she was admitted to [facility] for for cirrhosis of the liver and after presenting to the ED with progressive shortness of breath, lower extremity edema, and abdominal distention and discomfort.  No chest pain at that time. During this admission, asymptomatic atrial flutter was noted, and per IM, she was started on low-dose Coreg with echo on 5/22 as copied below showing EF 55-60&, RVSP mildly elevated at ~35.0 mmHg, mild LAE, mild to moderate TR, degenerative MV, and moderate thickening of the aortic valve.  She was discharged home on Lasix and spironolactone.  At discharge, recommendation was for outpatient cardiology follow-up. Medications included Coreg 3.125 mg twice daily, Lasix 40 mg daily, and spironolactone 50 mg daily at that time.  She presented again to the [facility] emergency department on 01/28/2019 with complaint of weakness, chills, and shortness of breath.  She also  complained of melena.  Physical exam showed a large distended abdomen, consistent with ascites.  Hemoglobin at admission was 4 with subsequent transfusion. She was noted to be volume overloaded after transfusion and given IV lasix. GI was consulted and EGD performed with findings significant for severe portal hypertensive gastropathy in the gastric fundus, esophageal varices, hiatal hernia, and active GI bleeding with hemostatic clip successfully placed.  She was also noted to have decompensated cirrhosis with current alcohol use and Hep C (hep C antibody positive). Due to acute kidney injury in the setting of GI bleed, diuretics were discontinued by GI. On 6/1, patient c/o shortness of breath with CXR revealing pulmonary edema and small effusions. Lasix drip was thus restarted with close monitoring of BP. Spironolactone was restarted yesterday 6/2 with addition of nadolol (versus PTA Coreg 3.125mg ) for variceal bleeding prophylaxis. On 6/3 at 1:40AM, she was noted to be back in atrial fibrillation with RVR and c/o shortness of breath.  Per IM, she then converted back to sinus rhythm with elevated rates into the 120s and presumed to be sinus tachycardia on telemetry. EKG showed atrial flutter with 2:1 block, however, and it is uncertain if the patient converted back to sinus at that time given she was not on telemetry at time of cardiology consultation. Per IM, she was started on IV metoprolol with subsequent hypotension and BP 89/58.    Cardiology was consulted for further management; however, as noted directly above, unable to determine if patient was still in atrial fibrillation / atrial flutter with 2:1 block versus ST given she was not on telemetry. Given her elevated HR on exam and hypotension  on review of vitals, spironolactone was stopped in order to administer a one-time IV digoxin 0.25 mg administered to control her elevated rates. Recommendation was to replete potassium with goal 4.0 and continue to  hold spironolactone with low BP and to avoid prerenal AKI d/t hypotension.   Patient was anxious to leave and reportedly discharged before restart of telemetry to confirm atrial flutter with 2:1 block versus ST, as well as before further cardiac workup could be completed.  Past Medical History:  Diagnosis Date  . Cancer ([place])   . Hypertension     Past Surgical History:  Procedure Laterality Date  . ABDOMINAL HYSTERECTOMY    . APPENDECTOMY    . ESOPHAGOGASTRODUODENOSCOPY N/A 01/29/2019   Procedure: ESOPHAGOGASTRODUODENOSCOPY (EGD);  Surgeon: [name], MD;  Location: [facility] ENDOSCOPY;  Service: Gastroenterology;  Laterality: N/A;     Home Medications:  Prior to Admission medications   Medication Sig Start Date End Date Taking? Authorizing Provider  carvedilol (COREG) 3.125 MG tablet Take 1 tablet (3.125 mg total) by mouth 2 (two) times daily with a meal. 01/19/19  Yes Gladstone Lighter, MD  ciprofloxacin (CIPRO) 500 MG tablet Take 1 tablet (500 mg total) by mouth 2 (two) times daily for 5 days. 01/31/19 02/05/19  Saundra Shelling, MD  folic acid (FOLVITE) 1 MG tablet Take 1 tablet (1 mg total) by mouth daily for 30 days. 02/01/19 03/03/19  Saundra Shelling, MD  furosemide (LASIX) 40 MG tablet Take 1 tablet (40 mg total) by mouth 2 (two) times daily. 01/31/19 05/31/19  Saundra Shelling, MD  nadolol (CORGARD) 20 MG tablet Take 1 tablet (20 mg total) by mouth daily for 30 days. 02/01/19 03/03/19  Saundra Shelling, MD  pantoprazole (PROTONIX) 40 MG tablet Take 1 tablet (40 mg total) by mouth 2 (two) times daily. 01/31/19 01/31/20  Saundra Shelling, MD  Potassium Chloride ER 20 MEQ TBCR Take one tablet twice daily 01/31/19   Pyreddy, Reatha Harps, MD  rifaximin (XIFAXAN) 200 MG tablet Take 1 tablet (200 mg total) by mouth 3 (three) times daily for 30 days. 01/31/19 03/02/19  Saundra Shelling, MD  spironolactone (ALDACTONE) 50 MG tablet Take 3 tablets (150 mg total) by mouth daily. 01/31/19 05/31/19  Saundra Shelling, MD  thiamine 100  MG tablet Take 1 tablet (100 mg total) by mouth daily for 30 days. 02/01/19 03/03/19  Saundra Shelling, MD    Inpatient Medications: Scheduled Meds: . sodium chloride   Intravenous Once  . feeding supplement  1 Container Oral TID BM  . folic acid  1 mg Oral Daily  . furosemide  40 mg Intravenous BID  . mouth rinse  15 mL Mouth Rinse BID  . metoprolol tartrate  5 mg Intravenous Once  . multivitamin with minerals  1 tablet Oral Daily  . nadolol  20 mg Oral Daily  . pantoprazole (PROTONIX) IV  40 mg Intravenous Q12H  . sodium chloride flush  3 mL Intravenous Q12H  . spironolactone  100 mg Oral Daily  . thiamine  100 mg Oral Daily   Continuous Infusions: . sodium chloride 0 mL/hr at 01/29/19 0223  . cefTRIAXone (ROCEPHIN)  IV 1 g (01/30/19 2045)   PRN Meds: sodium chloride, levalbuterol, LORazepam, morphine injection, simethicone  Allergies:   No Known Allergies  Social History:   Social History   Socioeconomic History  . Marital status: Married    Spouse name: Not on file  . Number of children: Not on file  . Years of education: Not on file  .  Highest education level: Not on file  Occupational History  . Not on file  Social Needs  . Financial resource strain: Not on file  . Food insecurity:    Worry: Not on file    Inability: Not on file  . Transportation needs:    Medical: Not on file    Non-medical: Not on file  Tobacco Use  . Smoking status: Current Every Day Smoker  . Smokeless tobacco: Never Used  Substance and Sexual Activity  . Alcohol use: Yes  . Drug use: Not on file  . Sexual activity: Not on file  Lifestyle  . Physical activity:    Days per week: Not on file    Minutes per session: Not on file  . Stress: Not on file  Relationships  . Social connections:    Talks on phone: Not on file    Gets together: Not on file    Attends religious service: Not on file    Active member of club or organization: Not on file    Attends meetings of clubs or  organizations: Not on file    Relationship status: Not on file  . Intimate partner violence:    Fear of current or ex partner: Not on file    Emotionally abused: Not on file    Physically abused: Not on file    Forced sexual activity: Not on file  Other Topics Concern  . Not on file  Social History Narrative  . Not on file    Family History:   History reviewed. No pertinent family history.  Reported family history of cardiac disease or arrhythmia. ROS:  Please see the history of present illness.  Review of Systems  Constitutional: Positive for malaise/fatigue.  Respiratory: Positive for shortness of breath.        Shortness of breath associated with atrial fibrillation and volume overload.  Not current.  Cardiovascular: Positive for leg swelling. Negative for chest pain, palpitations and orthopnea.       Previous b/l LEE, improved with diuresis  Gastrointestinal: Positive for abdominal pain and melena.       Abdominal pain and distention in the setting of the above (GI bleed, liver cirrhosis).  Psychiatric/Behavioral:       Current alcohol and tobacco use.  All other systems reviewed and are negative.   All other ROS reviewed and negative.     Physical Exam/Data:   Vitals:   01/31/19 0400 01/31/19 0500 01/31/19 0927 01/31/19 0946  BP: 92/65  103/71   Pulse: (!) 121  (!) 118   Resp: 20  (!) 26 20  Temp: 98.5 F (36.9 C)  98.5 F (36.9 C)   TempSrc:   Oral   SpO2: 98%  95%   Weight:  72.8 kg    Height:        Intake/Output Summary (Last 24 hours) at 01/31/2019 1136 Last data filed at 01/31/2019 1052 Gross per 24 hour  Intake 282.88 ml  Output 2225 ml  Net -1942.12 ml   Filed Weights   01/29/19 1423 01/30/19 0521 01/31/19 0500  Weight: 71.1 kg 70.2 kg 72.8 kg   Body mass index is 23.7 kg/m.  General:  Thin female in no acute distress. Anxious to leave hospital. HEENT: normal Lymph: no adenopathy Neck: no JVD Endocrine:  No thryomegaly Vascular: No carotid  bruits Cardiac:  normal S1, S2; IRIR, tachycardic; no murmur  Lungs:  clear to auscultation bilaterally, no wheezing, rhonchi or rales  Abd: soft, nontender, no  hepatomegaly  Ext: no b/l lower extremity edema Musculoskeletal:  No deformities, BUE and BLE strength normal and equal Skin: warm and dry  Neuro:  CNs 2-12 intact, no focal abnormalities noted Psych:  Normal affect    EKG:  The EKG was personally reviewed and demonstrates:  Atrial fibrillation and flutter with 2:1 block Telemetry:  Telemetry was personally reviewed and demonstrates: Not on telemetry at time of consultation.  Review of CV strips available per EMR showed atrial fibrillation and flutter with 2:1 block versus sinus tachycardia as noted by IM  Relevant CV Studies:  01/19/19 TTE  1. The left ventricle has normal systolic function, with an ejection fraction of 55-60%. The cavity size was normal. Left ventricular diastolic function could not be evaluated. No evidence of left ventricular regional wall motion abnormalities.  2. The right ventricle has normal systolic function. The cavity was not assessed. There is no increase in right ventricular wall thickness. Right ventricular systolic pressure is mildly elevated with an estimated pressure of 35.0 mmHg.  3. Left atrial size was mildly dilated.  4. The mitral valve is degenerative. Mild thickening of the mitral valve leaflet. There is moderate mitral annular calcification present. No evidence of mitral valve stenosis.  5. The tricuspid valve is grossly normal. Tricuspid valve regurgitation is mild-moderate.  6. The aortic valve is tricuspid. Moderate thickening of the aortic valve. Mild calcification of the aortic valve.  7. The aortic root is normal in size and structure.  8. The inferior vena cava was dilated in size with <50% respiratory variability.  9. Underlying rhythm appears to be atrial flutter.  FINDINGS  Left Ventricle: The left ventricle has normal systolic  function, with an ejection fraction of 55-60%. The cavity size was normal. There is borderline increase in left ventricular wall thickness. Left ventricular diastolic function could not be  evaluated. No evidence of left ventricular regional wall motion abnormalities..  Right Ventricle: The right ventricle has normal systolic function. The cavity was not assessed. There is no increase in right ventricular wall thickness. Right ventricular systolic pressure is mildly elevated with an estimated pressure of 35.0 mmHg.  Left Atrium: Left atrial size was mildly dilated.  Right Atrium: Right atrial size was not well visualized. Right atrial pressure is estimated at 15 mmHg.  Interatrial Septum: No atrial level shunt detected by color flow Doppler.  Pericardium: There is no evidence of pericardial effusion. There is ascites noted.  Mitral Valve: The mitral valve is degenerative in appearance. Mild thickening of the mitral valve leaflet. There is moderate mitral annular calcification present. Mitral valve regurgitation is not visualized by color flow Doppler. No evidence of mitral  valve stenosis.  Tricuspid Valve: The tricuspid valve is grossly normal. Tricuspid valve regurgitation is mild-moderate by color flow Doppler.  Aortic Valve: The aortic valve is tricuspid Moderate thickening of the aortic valve. Mild calcification of the aortic valve. Aortic valve regurgitation was not visualized by color flow Doppler. There is no evidence of aortic valve stenosis.  Pulmonic Valve: The pulmonic valve was normal in structure. Pulmonic valve regurgitation is trivial by color flow Doppler. No evidence of pulmonic stenosis.  Aorta: The aortic root is normal in size and structure.  Pulmonary Artery: The pulmonary artery is not well seen.  Venous: The inferior vena cava is dilated in size with less than 50% respiratory variability.   Laboratory Data:  Chemistry Recent Labs  Lab 01/28/19  1656 01/29/19 0157 01/31/19 0218  NA 128* 130* 137  K  4.3 4.1 2.8*  CL 94* 98 108  CO2 15* 19* 22  GLUCOSE 114* 112* 112*  BUN 94* 98* 39*  CREATININE 1.62* 1.72* 0.70  CALCIUM 8.6* 7.8* 8.2*  GFRNONAA 35* 32* >60  GFRAA 40* 38* >60  ANIONGAP 19* 13 7    Recent Labs  Lab 01/28/19 1656  PROT 5.6*  ALBUMIN 3.3*  AST 69*  ALT 30  ALKPHOS 196*  BILITOT 2.3*   Hematology Recent Labs  Lab 01/28/19 1656 01/29/19 0157  01/29/19 1847 01/30/19 1733 01/31/19 0218  WBC 25.8* 21.9*  --   --   --  19.0*  RBC 1.64* 2.06*  --   --   --  3.06*  HGB 4.0* 5.6*   < > 8.7* 9.8* 8.6*  HCT 13.0* 16.6*   < > 26.6* 30.1* 26.1*  MCV 79.3* 80.6  --   --   --  85.3  MCH 24.4* 27.2  --   --   --  28.1  MCHC 30.8 33.7  --   --   --  33.0  RDW 20.1* 17.9*  --   --   --  18.3*  PLT 288 199  --   --   --  181   < > = values in this interval not displayed.   Cardiac Enzymes Recent Labs  Lab 01/28/19 1656  TROPONINI <0.03   No results for input(s): TROPIPOC in the last 168 hours.  BNPNo results for input(s): BNP, PROBNP in the last 168 hours.  DDimer No results for input(s): DDIMER in the last 168 hours.  Radiology/Studies:  Dg Chest Port 1 View  Result Date: 01/31/2019 CLINICAL DATA:  58 year old female with shortness of breath. EXAM: PORTABLE CHEST 1 VIEW COMPARISON:  Chest radiograph dated 01/29/2019 FINDINGS: There is shallow inspiration. There is stable cardiomegaly with mild vascular congestion and possible early edema. Small right and possibly left pleural effusion. Focal area of density at the right lung base may represent atelectatic changes although developing infiltrate is not excluded. Clinical correlation is recommended. No pneumothorax. No acute osseous pathology. IMPRESSION: 1. Cardiomegaly with mild vascular congestion and possible early edema. 2. Small bilateral pleural effusions and right lung base atelectasis versus developing infiltrate. Overall no significant interval change  since the most recent prior exam. Electronically Signed   By: Anner Crete M.D.   On: 01/31/2019 00:44   Dg Chest Port 1 View  Result Date: 01/29/2019 CLINICAL DATA:  Wheezing. EXAM: PORTABLE CHEST 1 VIEW COMPARISON:  01/28/2019 FINDINGS: The lung volumes are low. The cardiac silhouette is enlarged. There is developing pulmonary edema and small bilateral pleural effusions. There is no pneumothorax. No acute osseous abnormality. Bibasilar airspace opacities are favored to represent atelectasis. IMPRESSION: Cardiomegaly with developing pulmonary edema and small bilateral pleural effusions. Electronically Signed   By: Constance Holster M.D.   On: 01/29/2019 22:50   Dg Chest Port 1 View  Result Date: 01/28/2019 CLINICAL DATA:  Shortness of breath EXAM: PORTABLE CHEST 1 VIEW COMPARISON:  01/18/2019 FINDINGS: Mild cardiomegaly. Lungs clear. No effusions or edema. No acute bony abnormality. IMPRESSION: Cardiomegaly.  No active disease. Electronically Signed   By: Rolm Baptise M.D.   On: 01/28/2019 18:18   Dg Abd Portable 1v  Result Date: 01/30/2019 CLINICAL DATA:  Ileus. EXAM: PORTABLE ABDOMEN - 1 VIEW COMPARISON:  None. FINDINGS: The bowel gas pattern is normal. No radio-opaque calculi or other significant radiographic abnormality are seen. IMPRESSION: No definite evidence of bowel obstruction or ileus.  Electronically Signed   By: Marijo Conception M.D.   On: 01/30/2019 18:17    Assessment and Plan:   Paroxysmal Atrial Fibrillation / Atrial Flutter - Atrial fibrillation and atrial flutter with 2-1 block noted this admission and not new to the patient with previous 12/2018 admission showing atrial flutter. -Per IM, patient converted to sinus rhythm overnight.  However, at time of consultation, patient was not on telemetry to confirm this finding.  On review of CV strips and EKGs, suspect patient may not have converted as she appears to have been in atrial flutter with 2-1 block versus sinus tachycardia.  Recommendation for restart of telemetry; however, patient discharged. -- Continue nadolol for rate control and titrate as BP allows. Nadolol started per GI given portal hypertension and as prophylaxis fpr esophageal varices - previously on Coreg.    - One time dose of IV digoxin 0.25mg  administered for further rate control. Recommend close monitoring of renal function given AKI earlier in admission. Current Cr stable and 0.70 with BUN elevated at 39. Spironolactone stopped to allow for further rate control, given hypotension. - Check TSH, check Mg. K 2.8 and replete with goal 4.0.  - No anticoagulation given GI bleed and anemia with esophageal varices and risk of bleeding with rupture. CHA2DS2VASc score of at least 3 (HTN, female, HFpEF) and pending further workup of risk factors. - DCCV not recommended as patient cannot be anticoagulated. - Future repeat echo recommended, given suspect at least some of her volume overload is d/t worsening HFpEF with untreated RVR.  Consider Zio to assess Afib/Aflutter burden. - Strongly recommend outpatient follow-up with cardiology for the above labs, a repeat echo to reassess EF, and for further management and evaluation of her Afib/Aflutter with possible EP consultation.   Hypotension - Complicating rate control as above.  - Spironolactone stopped to prevent AKI given hypotension and as administered one time dose of digoxin as above for rate control. - Continue to monitor.  Hypokalemia - K 2.8. - Recommend repletion of potassium with goal 4.0 to reduce risk of arrhythmia.   - Checking Mg with goal 2.0. - Recommend resume spironolactone as an outpatient with stable blood pressure and improved renal function, as this is a potassium sparing diuretic.  It was held this admission due to hypotension. - Daily BMET monitor renal function and electrolytes.  HFpEF -Previous echo as above.  PTA lasix, spironolactone, and BB.  - Volume overload this admission with  SOB/LEE and likely multifactorial in setting of ascites and with receiving IV fluids as well as PRBC.  - LEE and SOB improved following IV lasix.  - Spironolactone held for hypotension as above. - Continue nadolol as above. - Continue lasix as above.  - Recommend outpatient follow-up with repeat echo.    Hypokalemia - K 2.8. Goal 4.0. Check Mg  GIB - Per IM, GI - Recommend PPI - Recommend alcohol cessation  Tobacco and alcohol use - Cessation advised   For questions or updates, please contact [facility] Please consult [email or web address] for contact info under     Signed, Arvil Chaco, PA-C  01/31/2019 11:36 AM

## 2019-02-01 ENCOUNTER — Telehealth: Payer: Self-pay | Admitting: Cardiovascular Disease

## 2019-02-01 ENCOUNTER — Other Ambulatory Visit: Payer: Self-pay | Admitting: Gastroenterology

## 2019-02-01 ENCOUNTER — Telehealth: Payer: Self-pay | Admitting: Gastroenterology

## 2019-02-01 ENCOUNTER — Ambulatory Visit: Payer: Medicare Other | Admitting: Cardiovascular Disease

## 2019-02-01 NOTE — Telephone Encounter (Signed)
Spouce called stating he needs another medication for the patient. Dr Merrily Brittle prescribed Rifaximin 200 mg 3x's a day. This is to expensive for them to get. They need something else in place of that's affordable. They use [facility] pharmacy on [address].

## 2019-02-01 NOTE — Telephone Encounter (Signed)
Patient family member calling back States that they do not have insurance to help with prescriptions so when making the decision to switch they hope for something affordable  Please advise

## 2019-02-01 NOTE — Telephone Encounter (Signed)
Patient spouse calling  Patient had an appointment scheduled for an in office appointment this morning 02-01-19 They needed to cancel and they were rescheduled for 03/13/2019 Patient spouse feels this too far and would like to know if we can find something sooner Declined virtual unless it is late in afternoon or on Friday where spouse would be able to help but would much prefer in office  Please call to advise on appointment

## 2019-02-01 NOTE — Telephone Encounter (Signed)
Pt has been scheduled for 02/09/2019

## 2019-02-01 NOTE — Telephone Encounter (Signed)
Ok thank you, will notify pt

## 2019-02-01 NOTE — Telephone Encounter (Signed)
Please advise 

## 2019-02-01 NOTE — Telephone Encounter (Signed)
Pt c/o medication issue:  1. Name of Medication: rifaximin  2. How are you currently taking this medication (dosage and times per day)? 200 mg po TID  3. Are you having a reaction (difficulty breathing--STAT)? No   4. What is your medication issue? Cost is $2100 is there another medication Dr. Fletcher Anon would substitute for this one

## 2019-02-01 NOTE — Telephone Encounter (Signed)
She won't need it now. Will wait until I see her for follow up  I should see her in next 2-3weeks  RV

## 2019-02-01 NOTE — Telephone Encounter (Signed)
Spoke with husband as patient was sleeping. It was concerning the Rifaximin antibiotic she is on. It costs $2100 dollars and they are not able to afford this. Advised him that this was not prescribed by Dr Fletcher Anon and he would need to call her PCP or GI doctor. He verbalized understanding and was very appreciative of my help.

## 2019-02-01 NOTE — Telephone Encounter (Signed)
Dr Rockey Situ,  You saw this patient in the hospital on 01/31/19. Would she be suited for virtual or in office? They prefer in office. Please advise.

## 2019-02-04 LAB — HCV RNA BY PCR, QN RFX GENO
HCV RNA Qnt(log copy/mL): UNDETERMINED log10 IU/mL
HepC Qn: NOT DETECTED IU/mL

## 2019-02-04 NOTE — Telephone Encounter (Signed)
She would be ok with virtual We can schedule with whatever works best for them She is unable to walk very far secondary to atrial flutter and SOB, walking around the hospital far entence would not be good for her I need blood pressure and heart rate measurements from home.  In the hospital she was in atrial flutter with fast rate and did not want to stay for management of her cardiac condition

## 2019-02-06 ENCOUNTER — Other Ambulatory Visit: Payer: Self-pay

## 2019-02-06 ENCOUNTER — Telehealth: Payer: Medicare Other | Admitting: Cardiovascular Disease

## 2019-02-06 NOTE — Telephone Encounter (Signed)
Spoke with patient and reviewed that in error she was scheduled with wrong provider. Rescheduled her to do telephone visit with Dr. Rockey Situ tomorrow and she confirmed virtual appointment via telephone. Advised that if her symptoms should worsen to please go to ED for further evaluation. She did report previous lymphoma and multiple transfusions. She states that she needs some assistance and provided her with number to Medicaid and that she can also find resource information for each county online. She was very appreciative for the help with no further questions at this time.

## 2019-02-06 NOTE — Telephone Encounter (Signed)
Spoke with patient and she reports difficulty breathing and walking. She reports swelling to feet, calf, and abdominal swelling. Reviewed low sodium diet, only drinking when thirsty, and compression socks/hose with leg elevation. She also expressed that she is having some high anxiety and needs something for this. Advised that we can help with her cardiac needs but that she should really establish care with a primary care provider to help coordinate her care. Provided her with [facility] number to get a list of primary care providers in her area. Reviewed process for telephone encounter today and also discussed that provider would call near her appointment time. She verbalized understanding with no further questions at this time.   YOUR CARDIOLOGY TEAM HAS ARRANGED FOR AN E-VISIT FOR YOUR APPOINTMENT - PLEASE REVIEW IMPORTANT INFORMATION BELOW SEVERAL DAYS PRIOR TO YOUR APPOINTMENT  Due to the recent COVID-19 pandemic, we are transitioning in-person office visits to tele-medicine visits in an effort to decrease unnecessary exposure to our patients, their families, and staff. These visits are billed to your insurance just like a normal visit is. We also encourage you to sign up for MyChart if you have not already done so. You will need a smartphone if possible. For patients that do not have this, we can still complete the visit using a regular telephone but do prefer a smartphone to enable video when possible. You may have a family member that lives with you that can help. If possible, we also ask that you have a blood pressure cuff and scale at home to measure your blood pressure, heart rate and weight prior to your scheduled appointment. Patients with clinical needs that need an in-person evaluation and testing will still be able to come to the office if absolutely necessary. If you have any questions, feel free to call our office.   CONSENT FOR TELE-HEALTH VISIT - PLEASE REVIEW  I hereby voluntarily request,  consent and authorize [facility] and its employed or contracted physicians, physician assistants, nurse practitioners or other licensed health care professionals (the Practitioner), to provide me with telemedicine health care services (the "Services") as deemed necessary by the treating Practitioner. I acknowledge and consent to receive the Services by the Practitioner via telemedicine. I understand that the telemedicine visit will involve communicating with the Practitioner through live audiovisual communication technology and the disclosure of certain medical information by electronic transmission. I acknowledge that I have been given the opportunity to request an in-person assessment or other available alternative prior to the telemedicine visit and am voluntarily participating in the telemedicine visit.  I understand that I have the right to withhold or withdraw my consent to the use of telemedicine in the course of my care at any time, without affecting my right to future care or treatment, and that the Practitioner or I may terminate the telemedicine visit at any time. I understand that I have the right to inspect all information obtained and/or recorded in the course of the telemedicine visit and may receive copies of available information for a reasonable fee.  I understand that some of the potential risks of receiving the Services via telemedicine include:  [name] Delay or interruption in medical evaluation due to technological equipment failure or disruption; . Information transmitted may not be sufficient (e.g. poor resolution of images) to allow for appropriate medical decision making by the Practitioner; and/or  . In rare instances, security protocols could fail, causing a breach of personal health information.  Furthermore, I acknowledge that it is my responsibility  to provide information about my medical history, conditions and care that is complete and accurate to the best of my ability. I  acknowledge that Practitioner's advice, recommendations, and/or decision may be based on factors not within their control, such as incomplete or inaccurate data provided by me or distortions of diagnostic images or specimens that may result from electronic transmissions. I understand that the practice of medicine is not an exact science and that Practitioner makes no warranties or guarantees regarding treatment outcomes. I acknowledge that I will receive a copy of this consent concurrently upon execution via email to the email address I last provided but may also request a printed copy by calling the office of [place].    I understand that my insurance will be billed for this visit.   I have read or had this consent read to me. . I understand the contents of this consent, which adequately explains the benefits and risks of the Services being provided via telemedicine.  . I have been provided ample opportunity to ask questions regarding this consent and the Services and have had my questions answered to my satisfaction. . I give my informed consent for the services to be provided through the use of telemedicine in my medical care  By participating in this telemedicine visit I agree to the above.

## 2019-02-06 NOTE — Telephone Encounter (Signed)
Spoke with patients husband per release form and reviewed virtual appointment to follow up with his wife. He states that they have had difficulty with video visits but would be able to telephone visit. Reviewed appointment and how that works and advised that provider would call and speak with her over the phone. He did mention some leg swelling and reviewed that she should wear compression socks/hose and keep legs elevated. Also reviewed low sodium diet and fluid information as well. He requested that I please reach out to her with all of this information and scheduled the telephone appointment. They also want the number to [facility] for PCP options. Will call her on [phone] to review this information with her. He was appreciative for the call with no further questions at this time.

## 2019-02-06 NOTE — Telephone Encounter (Signed)

## 2019-02-06 NOTE — Telephone Encounter (Signed)
I am not sure how much I will be able to help this patient.  She has mostly noncardiac issue with liver cirrhosis and GI bleed.  She would be best served to see a primary care physician.  If she is feeling that bad, she might need to go to the ER for evaluation given her known history of severe anemia and uncontrolled atrial flutter.

## 2019-02-07 ENCOUNTER — Telehealth (INDEPENDENT_AMBULATORY_CARE_PROVIDER_SITE_OTHER): Payer: Medicare Other | Admitting: Cardiovascular Disease

## 2019-02-07 ENCOUNTER — Other Ambulatory Visit: Payer: Self-pay

## 2019-02-07 DIAGNOSIS — R630 Anorexia: Secondary | ICD-10-CM | POA: Insufficient documentation

## 2019-02-07 DIAGNOSIS — I483 Typical atrial flutter: Secondary | ICD-10-CM

## 2019-02-07 DIAGNOSIS — D649 Anemia, unspecified: Secondary | ICD-10-CM | POA: Diagnosis not present

## 2019-02-07 DIAGNOSIS — K7031 Alcoholic cirrhosis of liver with ascites: Secondary | ICD-10-CM | POA: Diagnosis not present

## 2019-02-07 DIAGNOSIS — I4819 Other persistent atrial fibrillation: Secondary | ICD-10-CM | POA: Diagnosis not present

## 2019-02-07 DIAGNOSIS — I4892 Unspecified atrial flutter: Secondary | ICD-10-CM | POA: Insufficient documentation

## 2019-02-07 NOTE — Patient Instructions (Signed)
Medication Instructions:  No changes  If you need a refill on your cardiac medications before your next appointment, please call your pharmacy.    Lab work: No new labs needed   If you have labs (blood work) drawn today and your tests are completely normal, you will receive your results only by: [name] MyChart Message (if you have MyChart) OR . A paper copy in the mail If you have any lab test that is abnormal or we need to change your treatment, we will call you to review the results.   Testing/Procedures: No new testing needed   Follow-Up: At [facility], you and your health needs are our priority.  As part of our continuing mission to provide you with exceptional heart care, we have created designated Provider Care Teams.  These Care Teams include your primary Cardiologist (physician) and Advanced Practice Providers (APPs -  Physician Assistants and Nurse Practitioners) who all work together to provide you with the care you need, when you need it.  . You will need a follow up appointment in 3 months .   Please call our office 2 months in advance to schedule this appointment.    . Providers on your designated Care Team:   . Murray Hodgkins, NP . Christell Faith, PA-C . Marrianne Mood, PA-C  Any Other Special Instructions Will Be Listed Below (If Applicable).  For educational health videos Log in to : [email or web address] Or : [email or web address], password : [email or web address]

## 2019-02-07 NOTE — Progress Notes (Signed)
Virtual Visit via Telephone Note   This visit type was conducted due to national recommendations for restrictions regarding the COVID-19 Pandemic (e.g. social distancing) in an effort to limit this patient's exposure and mitigate transmission in our community.  Due to her co-morbid illnesses, this patient is at least at moderate risk for complications without adequate follow up.  This format is felt to be most appropriate for this patient at this time.  The patient did not have access to video technology/had technical difficulties with video requiring transitioning to audio format only (telephone).  All issues noted in this document were discussed and addressed.  No physical exam could be performed with this format.  Please refer to the patient's chart for her  consent to telehealth for [facility].   I connected with  Anita Reed on 02/07/19 by a video enabled telemedicine application and verified that I am speaking with the correct person using two identifiers. I discussed the limitations of evaluation and management by telemedicine. The patient expressed understanding and agreed to proceed.   Evaluation Performed:  Follow-up visit  Date:  02/07/2019   ID:  Anita Reed, DOB [birth year], MRN [id number]  Patient Location:  [address] [place] [address]   Provider location:   [facility], [place] office  PCP:  Patient, No Pcp Per  Cardiologist:  Patsy Baltimore   Chief Complaint:   Swollen , leg , ABD   History of Present Illness:    Anita Reed is a 58 y.o. female who presents via audio/video conferencing for a telehealth visit today.   The patient does not symptoms concerning for COVID-19 infection (fever, chills, cough, or new SHORTNESS OF BREATH).   Patient has a past medical history of  alcohol abuse, cirrhosis, portal hypertension, varices, medication noncompliance, hepatitis C , atrial flutter on EKG May 2020 presenting to the hospital with  weakness, chills shortness of breath, melena Abdomen distended consistent with ascites Hemoglobin of 4 requiring transfusion EGD performed showing severe portal hypertensive gastropathy active GI bleed Clipping performed She presents to establish care in the office, follow-up of her atrial flutter, fibrillation  Recent admission to the hospital for upper GI bleed Chest x-ray with pulmonary edema, small effusions Treated with Lasix  Noted to be in atrial fibrillation 1:40 AM with shortness of breath Rhythm broke back to atrial flutter Previous EKGs reviewed showing atrial flutter rate 120s  She received 2 units packed red blood cells in the hospital for anemia In the setting of GI bleed, hemoglobin dropping to 8.6  She declined further stay in the hospital " Do you know how many days I have been here?" She did not have much interest in management of her atrial flutter at that time and left the hospital.  Potassium at that time less than 3,  She was discharged on potassium twice daily Lasix 40 twice daily with spironolactone  Review of prior EKG shows atrial flutter Jan 18, 2019, EKG on recent hospital admission showing atrial fibrillation, this broke back to flutter  On discussion today she reports Lots of swelling , ABD, legs  No blood pressure cuff Unable to weigh herself unable to measure her pulse at home  Still drinking some alcohol, occasional beer or wine  Reports her diet is better For some time she was not eating well, had a phobia about gaining weight Taking Advil at times for muscle cramps  She does not appreciate tachycardia or palpitations Not very active at  baseline  She reports that she did not have an EGD in the hospital, though there are notes documenting EGD findings. "  They were going to do it but did not do it in the end" Unaware that she had clipping  She is aware that she has cirrhosis   Prior CV studies:   The following studies were reviewed  today:    Past Medical History:  Diagnosis Date  . Cancer ([place])   . Hypertension    Past Surgical History:  Procedure Laterality Date  . ABDOMINAL HYSTERECTOMY    . APPENDECTOMY    . ESOPHAGOGASTRODUODENOSCOPY N/A 01/29/2019   Procedure: ESOPHAGOGASTRODUODENOSCOPY (EGD);  Surgeon: [name], MD;  Location: [facility];  Service: Gastroenterology;  Laterality: N/A;     No outpatient medications have been marked as taking for the 02/07/19 encounter (Telemedicine) with Minna Merritts, MD.     Allergies:   Patient has no known allergies.   Social History   Tobacco Use  . Smoking status: Current Every Day Smoker  . Smokeless tobacco: Never Used  Substance Use Topics  . Alcohol use: Yes  . Drug use: Not on file     Current Outpatient Medications on File Prior to Visit  Medication Sig Dispense Refill  . folic acid (FOLVITE) 1 MG tablet Take 1 tablet (1 mg total) by mouth daily for 30 days. 30 tablet 0  . furosemide (LASIX) 40 MG tablet Take 1 tablet (40 mg total) by mouth 2 (two) times daily. 60 tablet 3  . nadolol (CORGARD) 20 MG tablet Take 1 tablet (20 mg total) by mouth daily for 30 days. 30 tablet 0  . pantoprazole (PROTONIX) 40 MG tablet Take 1 tablet (40 mg total) by mouth 2 (two) times daily. 60 tablet 11  . Potassium Chloride ER 20 MEQ TBCR Take one tablet twice daily 60 tablet 1  . rifaximin (XIFAXAN) 200 MG tablet Take 1 tablet (200 mg total) by mouth 3 (three) times daily for 30 days. 90 tablet 0  . spironolactone (ALDACTONE) 50 MG tablet Take 3 tablets (150 mg total) by mouth daily. 90 tablet 3  . thiamine 100 MG tablet Take 1 tablet (100 mg total) by mouth daily for 30 days. 30 tablet 0   No current facility-administered medications on file prior to visit.      Family Hx: The patient's family history is not on file.  ROS:   Please see the history of present illness.    Review of Systems  Constitutional: Negative.   HENT: Negative.    Respiratory: Positive for shortness of breath.   Cardiovascular: Positive for leg swelling.  Gastrointestinal: Negative.   Musculoskeletal: Negative.   Neurological: Negative.   Psychiatric/Behavioral: Negative.   All other systems reviewed and are negative.     Labs/Other Tests and Data Reviewed:    Recent Labs: 01/18/2019: B Natriuretic Peptide 771.0 01/28/2019: ALT 30 01/29/2019: Magnesium 2.1 01/31/2019: BUN 39; Creatinine, Ser 0.70; Hemoglobin 8.6; Platelets 181; Potassium 2.8; Sodium 137   Recent Lipid Panel No results found for: CHOL, TRIG, HDL, CHOLHDL, LDLCALC, LDLDIRECT  Wt Readings from Last 3 Encounters:  01/31/19 160 lb 7.9 oz (72.8 kg)  01/18/19 140 lb (63.5 kg)     Exam:    Vital Signs: Vital signs may also be detailed in the HPI There were no vitals taken for this visit.  Wt Readings from Last 3 Encounters:  01/31/19 160 lb 7.9 oz (72.8 kg)  01/18/19 140 lb (63.5 kg)  Temp Readings from Last 3 Encounters:  01/31/19 98.5 F (36.9 C) (Oral)  01/19/19 98.2 F (36.8 C)   BP Readings from Last 3 Encounters:  01/31/19 103/71  01/19/19 100/67   Pulse Readings from Last 3 Encounters:  01/31/19 (!) 118  01/19/19 (!) 121     Well nourished, well developed female in no acute distress. Constitutional:  oriented to person, place, and time. No distress.    ASSESSMENT & PLAN:    Typical atrial flutter (HCC)  Alcoholic cirrhosis of liver with ascites (HCC)  Persistent atrial fibrillation  Anemia, unspecified type  Atrial flutter with RVR First documented on EKG 01/18/2019 Difficult to control rate given hypotension As documented in the hospital Reports that she is tolerating nadolol No way to measure blood pressure or heart rate at home Not a good candidate for anticoagulation given GI bleeding, varices She is relatively asymptomatic from her arrhythmia though this is likely contributing to fluid retention  Atrial fibrillation with RVR Documented in  the hospital but converted to atrial flutter Declined staying in the hospital for work-up of her flutter, left essentially AMA Even despite hypokalemia Recommend she continue her nadolol Potassium repletion  Hypokalemia: In the hospital, 2.8 Reports she is taking potassium at home now with Lasix but continues to have severe cramping Recommend she try to have lab work done when she has follow-up with GI later this week  Anorexia "phobia of being fat" Was not eating for awhile  Anemia In the setting of GI bleed, hemoglobin dropping to 8.6 Had 2 units packed red blood cells in the hospital Suggest repeat blood work with GI later this week  Alcoholic cirrhosis Alcohol cessation recommended On Lasix, nadolol for portal hypertension Reports that she continues to drink periodic wine and beer Only found that it was bad for her liver in the past month  GI bleed Recent clipping.  She denies having had EGD High risk of additional bleeding given varices She is not on anticoagulation for atrial flutter or fibrillation given high bleeding risk    COVID-19 Education: The signs and symptoms of COVID-19 were discussed with the patient and how to seek care for testing (follow up with PCP or arrange E-visit).  The importance of social distancing was discussed today.  Patient Risk:   After full review of this patients clinical status, I feel that they are at least moderate risk at this time.  Time:   Today, I have spent 35 minutes with the patient with telehealth technology discussing the cardiac and medical problems/diagnoses detailed above   10 min spent reviewing the chart prior to patient visit today   Medication Adjustments/Labs and Tests Ordered: Current medicines are reviewed at length with the patient today.  Concerns regarding medicines are outlined above.   Tests Ordered: No tests ordered   Medication Changes: No changes made   Disposition: Follow-up in 3 months    Signed, Ida Rogue, MD  02/07/2019 12:16 PM    [place] [facility] [address] [place] [address], [place], [place] [address]

## 2019-02-09 ENCOUNTER — Other Ambulatory Visit: Payer: Self-pay

## 2019-02-09 ENCOUNTER — Other Ambulatory Visit
Admission: RE | Admit: 2019-02-09 | Discharge: 2019-02-09 | Disposition: A | Discharge status: Home / Self Care | Payer: Medicare Other | Source: Ambulatory Visit | Attending: Gastroenterology | Admitting: Gastroenterology

## 2019-02-09 ENCOUNTER — Encounter: Payer: Self-pay | Admitting: Gastroenterology

## 2019-02-09 ENCOUNTER — Ambulatory Visit (INDEPENDENT_AMBULATORY_CARE_PROVIDER_SITE_OTHER): Payer: Medicare Other | Admitting: Gastroenterology

## 2019-02-09 VITALS — BP 86/53 | HR 70 | Temp 98.6°F | Resp 16 | Wt 140.0 lb

## 2019-02-09 DIAGNOSIS — K7460 Unspecified cirrhosis of liver: Secondary | ICD-10-CM

## 2019-02-09 DIAGNOSIS — K7031 Alcoholic cirrhosis of liver with ascites: Secondary | ICD-10-CM

## 2019-02-09 DIAGNOSIS — K7290 Hepatic failure, unspecified without coma: Secondary | ICD-10-CM

## 2019-02-09 DIAGNOSIS — Z23 Encounter for immunization: Secondary | ICD-10-CM

## 2019-02-09 LAB — PROTIME-INR
INR: 1.2 (ref 0.8–1.2)
Prothrombin Time: 14.6 seconds (ref 11.4–15.2)

## 2019-02-09 LAB — IRON AND TIBC
Iron: 30 ug/dL (ref 28–170)
Saturation Ratios: 6 % — ABNORMAL LOW (ref 10.4–31.8)
TIBC: 496 ug/dL — ABNORMAL HIGH (ref 250–450)
UIBC: 466 ug/dL

## 2019-02-09 LAB — CBC
HCT: 34.5 % — ABNORMAL LOW (ref 36.0–46.0)
Hemoglobin: 10.9 g/dL — ABNORMAL LOW (ref 12.0–15.0)
MCH: 27.2 pg (ref 26.0–34.0)
MCHC: 31.6 g/dL (ref 30.0–36.0)
MCV: 86 fL (ref 80.0–100.0)
Platelets: 319 10*3/uL (ref 150–400)
RBC: 4.01 MIL/uL (ref 3.87–5.11)
RDW: 19 % — ABNORMAL HIGH (ref 11.5–15.5)
WBC: 12.2 10*3/uL — ABNORMAL HIGH (ref 4.0–10.5)
nRBC: 0 % (ref 0.0–0.2)

## 2019-02-09 LAB — COMPREHENSIVE METABOLIC PANEL
ALT: 42 U/L (ref 0–44)
AST: 74 U/L — ABNORMAL HIGH (ref 15–41)
Albumin: 3.7 g/dL (ref 3.5–5.0)
Alkaline Phosphatase: 401 U/L — ABNORMAL HIGH (ref 38–126)
Anion gap: 12 (ref 5–15)
BUN: 20 mg/dL (ref 6–20)
CO2: 23 mmol/L (ref 22–32)
Calcium: 9.1 mg/dL (ref 8.9–10.3)
Chloride: 99 mmol/L (ref 98–111)
Creatinine, Ser: 0.78 mg/dL (ref 0.44–1.00)
GFR calc Af Amer: >60 mL/min (ref >60)
GFR calc non Af Amer: >60 mL/min (ref >60)
Glucose, Bld: 148 mg/dL — ABNORMAL HIGH (ref 70–99)
Potassium: 4 mmol/L (ref 3.5–5.1)
Sodium: 134 mmol/L — ABNORMAL LOW (ref 135–145)
Total Bilirubin: 5.5 mg/dL — ABNORMAL HIGH (ref 0.3–1.2)
Total Protein: 6.7 g/dL (ref 6.5–8.1)

## 2019-02-09 LAB — FERRITIN: Ferritin: 22 ng/mL (ref 11–307)

## 2019-02-09 MED ORDER — SPIRONOLACTONE 50 MG PO TABS: 100.0000 mg | ORAL_TABLET | Freq: Every day | ORAL | 1 refills | Status: DC

## 2019-02-09 NOTE — Progress Notes (Signed)
Cephas Darby, MD [address]  [place]  [place], [place] [address]  Main: [phone]  Fax: [phone]    Gastroenterology Consultation  Referring Provider:     No ref. provider found Primary Care Physician:  Patient, No Pcp Per Primary Gastroenterologist:  Dr. Cephas Darby Reason for Consultation:     Decompensated alcoholic cirrhosis        HPI:   Anita Reed is a 58 y.o. female is here for hospital follow-up and management of decompensated alcoholic cirrhosis.  Patient is recently diagnosed with cirrhosis of liver in 12/2018 when she presented to [facility] with abdominal distention secondary to ascites which is consistent with portal hypertension based on fluid analysis, SSAG > 1.1.  She was discharged home on diuretics which she was not taking.  She then readmitted to [facility] secondary to active upper GI bleed about 2 weeks ago, EGD revealed Dieulafoy's lesion that was clipped.  She also had ascites at that time and underwent therapeutic paracentesis.  She was discharged home on furosemide 40 mg twice daily and spironolactone 150 mg daily, nadolol.  Her hemoglobin on discharge was 8.6.  Patient had chronic hep C which was treated in the past.  Viral load undetectable  Interval summary Patient reports that she did not catch even a drop of alcohol since last hospital admission.  She said in front of her husband that they removed all alcohol from her house.  She has been taking furosemide 40 mg twice daily but not spironolactone.  She is on Protonix 40 mg twice daily, also taking nadolol 20 mg daily.  She denies swelling in her legs but feels very gassy, abdomen distended.  She reports having irregular bowel habits chronically.  She reports her stools are orange-colored but not black or red.  She is trying to eat more protein.  Patient was seen on a tele-visit by Dr. Rockey Situ 2 days ago for a flutter which was thought to be secondary to hypokalemia.  Anticoagulation was deferred due to  recent GI bleed  NSAIDs: None  Antiplts/Anticoagulants/Anti thrombotics: None  GI Procedures: EGD 01/29/2019 - Normal duodenal bulb and second portion of the duodenum. - Severe portal hypertensive gastropathy in gastric fundus. - Large (> 5 mm) varices were found in the lower third of the esophagus, with no red wale signs or stigmata of recent bleed. - 2 cm hiatal hernia. - Dieulafoy lesion of stomach. - No specimens collected.  Patient reports having had several colonoscopies in the past  Past Medical History:  Diagnosis Date  . Cancer ([place])   . Hypertension     Past Surgical History:  Procedure Laterality Date  . ABDOMINAL HYSTERECTOMY    . APPENDECTOMY    . ESOPHAGOGASTRODUODENOSCOPY N/A 01/29/2019   Procedure: ESOPHAGOGASTRODUODENOSCOPY (EGD);  Surgeon: [name], MD;  Location: [facility] ENDOSCOPY;  Service: Gastroenterology;  Laterality: N/A;    Current Outpatient Medications:  .  folic acid (FOLVITE) 1 MG tablet, Take 1 tablet (1 mg total) by mouth daily for 30 days., Disp: 30 tablet, Rfl: 0 .  furosemide (LASIX) 40 MG tablet, Take 1 tablet (40 mg total) by mouth 2 (two) times daily., Disp: 60 tablet, Rfl: 3 .  nadolol (CORGARD) 20 MG tablet, Take 1 tablet (20 mg total) by mouth daily for 30 days., Disp: 30 tablet, Rfl: 0 .  pantoprazole (PROTONIX) 40 MG tablet, Take 1 tablet (40 mg total) by mouth 2 (two) times daily., Disp: 60 tablet, Rfl: 11 .  Potassium  Chloride ER 20 MEQ TBCR, Take one tablet twice daily, Disp: 60 tablet, Rfl: 1 .  thiamine 100 MG tablet, Take 1 tablet (100 mg total) by mouth daily for 30 days., Disp: 30 tablet, Rfl: 0 .  spironolactone (ALDACTONE) 50 MG tablet, Take 2 tablets (100 mg total) by mouth daily., Disp: 180 tablet, Rfl: 1  No family history on file.   Social History   Tobacco Use  . Smoking status: Current Every Day Smoker  . Smokeless tobacco: Never Used  Substance Use Topics  . Alcohol use: Yes  . Drug use: Not on file     Allergies as of 02/09/2019  . (No Known Allergies)    Review of Systems:    All systems reviewed and negative except where noted in HPI.   Physical Exam:  BP (!) 86/53 (BP Location: Left Arm, Patient Position: Sitting, Cuff Size: Normal)   Pulse 70   Temp 98.6 F (37 C)   Resp 16   Wt 140 lb (63.5 kg)   BMI 20.67 kg/m  No LMP recorded. Patient is postmenopausal.  General:   Alert, thin built, poorly nourished, pleasant and cooperative in NAD Head:  Normocephalic and atraumatic, bitemporal wasting. Eyes:  Sclera positive icterus.   Conjunctiva pink. Ears:  Normal auditory acuity. Nose:  No deformity, discharge, or lesions. Mouth:  No deformity or lesions,oropharynx pink & moist. Neck:  Supple; no masses or thyromegaly. Lungs:  Respirations even and unlabored.  Clear throughout to auscultation.   No wheezes, crackles, or rhonchi. No acute distress. Heart:  Regular rate and rhythm; no murmurs, clicks, rubs, or gallops. Abdomen:  Normal bowel sounds. Soft, non-tender and diffusely distended, tympanic to percussion, dullness in bilateral flanks without masses, hepatosplenomegaly or hernias noted.  No guarding or rebound tenderness.   Rectal: Not performed Msk:  Symmetrical without gross deformities. Good, equal movement & strength bilaterally. Pulses:  Normal pulses noted. Extremities:  No clubbing or edema.  No cyanosis. Neurologic:  Alert and oriented x3;  grossly normal neurologically. Skin:  Intact without significant lesions or rashes. No jaundice. Psych:  Alert and cooperative. Normal mood and affect.  Imaging Studies: Reviewed  Assessment and Plan:   Anita Reed is a 58 y.o. female with history of chronic hep C, status post treatment, SVR, decompensated alcoholic cirrhosis with ascites, esophageal varices, upper GI bleed secondary to Dieulafoy's is here as a hospital follow-up and to establish care for cirrhosis  Decompensated cirrhosis of liver, secondary to alcohol  use Hepatitis A and B serologies negative Chronic hep C, cured, status post treatment Check ferritin levels Portal hypertension manifested as esophageal varices, ascites Ascites: Moderate, decrease furosemide to 40 mg daily, spironolactone 100 mg daily 2 g sodium diet, check BMP, continue oral potassium Esophageal varices: Nonbleeding, continue nadolol 20 mg daily Anemia: Recheck CBC, iron studies Continue prenatal multivitamin HCC screening: Check serum AFP levels, CT abdomen and pelvis in 12/2018 did not detect any liver lesions HRS: None Hepatic encephalopathy: Not present Reiterated to her about complete abstinence from alcohol Recommend Twinrix, pneumonia vaccines: Administered first dose of Twinrix and Prevnar today  Follow up in 4 weeks   Cephas Darby, MD

## 2019-02-09 NOTE — Patient Instructions (Addendum)
1. Decrease Protonix to 40 mg once a day 2. Decrease furosemide to 40 mg once a day 3. Start spironolactone 100 mg daily   Please call our office to speak with my nurse Barnie Alderman [phone] during business hours from 8am to 4pm if you have any questions/concerns. During after hours, you will be redirected to on call GI physician. For any emergency please call 911 or go the nearest emergency room.    Cephas Darby, MD [address]  [place]  [place], [place] [address]  Main: [phone]  Fax: [phone]

## 2019-02-10 LAB — AFP TUMOR MARKER: AFP, Serum, Tumor Marker: 7.6 ng/mL (ref 0.0–8.3)

## 2019-02-13 ENCOUNTER — Telehealth: Payer: Medicare Other | Admitting: Cardiovascular Disease

## 2019-02-21 ENCOUNTER — Ambulatory Visit: Payer: Self-pay | Admitting: Family Medicine

## 2019-02-26 ENCOUNTER — Encounter: Payer: Self-pay | Admitting: Family Medicine

## 2019-02-26 ENCOUNTER — Ambulatory Visit (INDEPENDENT_AMBULATORY_CARE_PROVIDER_SITE_OTHER): Payer: Medicare Other | Admitting: Family Medicine

## 2019-02-26 ENCOUNTER — Other Ambulatory Visit: Payer: Self-pay

## 2019-02-26 VITALS — BP 96/62 | HR 85 | Temp 97.5°F | Resp 14 | Ht 67.5 in | Wt 136.6 lb

## 2019-02-26 DIAGNOSIS — I483 Typical atrial flutter: Secondary | ICD-10-CM

## 2019-02-26 DIAGNOSIS — K922 Gastrointestinal hemorrhage, unspecified: Secondary | ICD-10-CM | POA: Diagnosis not present

## 2019-02-26 DIAGNOSIS — Z8579 Personal history of other malignant neoplasms of lymphoid, hematopoietic and related tissues: Secondary | ICD-10-CM

## 2019-02-26 DIAGNOSIS — D649 Anemia, unspecified: Secondary | ICD-10-CM

## 2019-02-26 DIAGNOSIS — Z598 Other problems related to housing and economic circumstances: Secondary | ICD-10-CM

## 2019-02-26 DIAGNOSIS — E876 Hypokalemia: Secondary | ICD-10-CM

## 2019-02-26 DIAGNOSIS — M62838 Other muscle spasm: Secondary | ICD-10-CM

## 2019-02-26 DIAGNOSIS — F1010 Alcohol abuse, uncomplicated: Secondary | ICD-10-CM

## 2019-02-26 DIAGNOSIS — K7031 Alcoholic cirrhosis of liver with ascites: Secondary | ICD-10-CM

## 2019-02-26 DIAGNOSIS — I4819 Other persistent atrial fibrillation: Secondary | ICD-10-CM | POA: Diagnosis not present

## 2019-02-26 DIAGNOSIS — Z599 Problem related to housing and economic circumstances, unspecified: Secondary | ICD-10-CM

## 2019-02-26 MED ORDER — POTASSIUM CHLORIDE ER 20 MEQ PO TBCR: EXTENDED_RELEASE_TABLET | ORAL | 1 refills | Status: DC

## 2019-02-26 MED ORDER — FOLIC ACID 1 MG PO TABS: 1.0000 mg | ORAL_TABLET | Freq: Every day | ORAL | 0 refills | Status: AC

## 2019-02-26 MED ORDER — NADOLOL 20 MG PO TABS: 20.0000 mg | ORAL_TABLET | Freq: Every day | ORAL | 0 refills | Status: DC

## 2019-02-26 NOTE — Progress Notes (Signed)
Name: Anita Reed   MRN: [id number]    DOB: [birth year]   Date:02/26/2019       Progress Note  Subjective  Chief Complaint  Chief Complaint  Patient presents with  . New Patient (Initial Visit)  . Hospitalization Follow-up    dx with cirrhosis, pt having low blood pressure  . Spasms    bilateral feet and pain, cramps  . Medication Refill    HPI  Afib/Aflutter:  She had noticed palpitations about a year ago, was diagnosed with afib and a-flutter.  She has been taking her nadolol daily, not taking lasix daily (will take maybe once every 3 days), also compliant with her spironolactone.   Denies chest pain, shortness of breath, palpitations, her HR is controlled today in the office. No lightheadedness or dizziness.   Cirrhosis of the Liver: She no longer drinks alcohol.  She is seeing Dr. Marius Ditch. Recent labs do show elevated alk phos, bilirubin, and AST; ALT was upper limit of normal.  She does have  GERD: She is taking pantoprazole without issues. No abdominal pain.   Muscle Spasm: She gets muscle spasms that last 10-20 minutes - she is having spasms in her ankles, hands, feet, and calves intermittently.  She does have hypokalemia - last check 2 weeks ago did show improvement. She is currently taking 3 tablets of ibuprofen daily.  Anemia/GI Bleed: She had recent hospitalization at the end of May 2020 for anemia and GI Bleed. She did have 2 units of blood transfused.  She notes that since discharge she has had normal BM's with no blood or dark and tarry stools. Discharged home on folic acid, nadolol,   History lymphoma (non-hodgkin's):  Went through Chemo and did 4 years of Rotuxin.  After that she refused follow up care.   CT Chest for lung cancer screening - declines this and other health maintenance options today  ETOH Abuse: She quit 2 months ago.  She has seen psychiatry in the past and was on abilify and several other medications that did not work.  She does not want any  medication for mood disorder today nor does she want referral to psychiatry today.    Office Visit from 02/26/2019 in [facility]  PHQ-9 Total Score  0      Patient Active Problem List   Diagnosis Date Noted  . Typical atrial flutter ([place]) 02/07/2019  . Alcoholic cirrhosis of liver with ascites ([place]) 02/07/2019  . Persistent atrial fibrillation 02/07/2019  . Anemia 02/07/2019  . Anorexia 02/07/2019  . GI bleed 01/28/2019  . Ascites 01/18/2019  . Cancer [facility])     Past Surgical History:  Procedure Laterality Date  . ABDOMINAL HYSTERECTOMY    . APPENDECTOMY    . ESOPHAGOGASTRODUODENOSCOPY N/A 01/29/2019   Procedure: ESOPHAGOGASTRODUODENOSCOPY (EGD);  Surgeon: [name], MD;  Location: [facility] ENDOSCOPY;  Service: Gastroenterology;  Laterality: N/A;  . TONSILLECTOMY      Family History  Problem Relation Age of Onset  . Cancer Mother   . Cancer Father     Social History   Socioeconomic History  . Marital status: Married    Spouse name: Not on file  . Number of children: 1  . Years of education: 46  . Highest education level: Not on file  Occupational History  . Occupation: disabled  Social Needs  . Financial resource strain: Somewhat hard  . Food insecurity    Worry: Sometimes true    Inability: Sometimes true  .  Transportation needs    Medical: No    Non-medical: No  Tobacco Use  . Smoking status: Current Every Day Smoker  . Smokeless tobacco: Never Used  Substance and Sexual Activity  . Alcohol use: Not Currently  . Drug use: Never  . Sexual activity: Not Currently  Lifestyle  . Physical activity    Days per week: 0 days    Minutes per session: 0 min  . Stress: Only a little  Relationships  . Social Herbalist on phone: Never    Gets together: Never    Attends religious service: Never    Active member of club or organization: No    Attends meetings of clubs or organizations: Never    Relationship status: Married  .  Intimate partner violence    Fear of current or ex partner: No    Emotionally abused: No    Physically abused: No    Forced sexual activity: No  Other Topics Concern  . Not on file  Social History Narrative  . Not on file     Current Outpatient Medications:  .  folic acid (FOLVITE) 1 MG tablet, Take 1 tablet (1 mg total) by mouth daily for 30 days., Disp: 30 tablet, Rfl: 0 .  nadolol (CORGARD) 20 MG tablet, Take 1 tablet (20 mg total) by mouth daily for 30 days., Disp: 30 tablet, Rfl: 0 .  pantoprazole (PROTONIX) 40 MG tablet, Take 1 tablet (40 mg total) by mouth 2 (two) times daily., Disp: 60 tablet, Rfl: 11 .  Potassium Chloride ER 20 MEQ TBCR, Take one tablet twice daily, Disp: 60 tablet, Rfl: 1 .  spironolactone (ALDACTONE) 50 MG tablet, Take 2 tablets (100 mg total) by mouth daily., Disp: 180 tablet, Rfl: 1 .  thiamine 100 MG tablet, Take 1 tablet (100 mg total) by mouth daily for 30 days., Disp: 30 tablet, Rfl: 0 .  furosemide (LASIX) 40 MG tablet, Take 1 tablet (40 mg total) by mouth 2 (two) times daily. (Patient not taking: Reported on 02/26/2019), Disp: 60 tablet, Rfl: 3  No Known Allergies  I personally reviewed active problem list, medication list, allergies, notes from last encounter, lab results with the patient/caregiver today.   ROS Constitutional: Negative for fever or weight change.  Respiratory: Negative for cough and shortness of breath.   Cardiovascular: Negative for chest pain or palpitations.  Gastrointestinal: Negative for abdominal pain, no bowel changes.  Musculoskeletal: Negative for gait problem or joint swelling.  Skin: Negative for rash.  Neurological: Negative for dizziness or headache.  No other specific complaints in a complete review of systems (except as listed in HPI above).  Objective  Vitals:   02/26/19 1134  BP: 96/62  Pulse: 85  Resp: 14  Temp: (!) 97.5 F (36.4 C)  TempSrc: Oral  SpO2: 97%  Weight: 136 lb 9.6 oz (62 kg)  Height:  5' 7.5" (1.715 m)   Body mass index is 21.08 kg/m.  Physical Exam  Constitutional: Patient appears well-developed and well-nourished. No distress.  HENT: Head: Normocephalic and atraumatic. Eyes: Conjunctivae and EOM are normal. No scleral icterus.  Pupils are equal, round, and reactive to light.  Neck: Normal range of motion. Neck supple. No JVD present. No thyromegaly present.  Cardiovascular: Normal rate, regular rhythm and normal heart sounds.  No murmur heard. No BLE edema. Pulmonary/Chest: Effort normal and breath sounds normal. No respiratory distress. Musculoskeletal: Normal range of motion, no joint effusions. No gross deformities Neurological: Pt is  alert and oriented to person, place, and time. No cranial nerve deficit. Coordination, balance, strength, speech and gait are normal.  Skin: Skin is warm and dry. No rash noted. No erythema.  Psychiatric: Patient has a normal mood and affect. behavior is normal. Judgment and thought content normal.  No results found for this or any previous visit (from the past 72 hour(s)).  PHQ2/9: Depression screen [facility] 2/9 02/26/2019  Decreased Interest 0  Down, Depressed, Hopeless 0  PHQ - 2 Score 0  Altered sleeping 0  Tired, decreased energy 0  Change in appetite 0  Feeling bad or failure about yourself  0  Trouble concentrating 0  Moving slowly or fidgety/restless 0  Suicidal thoughts 0  PHQ-9 Score 0  Difficult doing work/chores Not difficult at all   PHQ-2/9 Result is negative.    Fall Risk: Fall Risk  02/26/2019  Falls in the past year? 0  Number falls in past yr: 0  Injury with Fall? 0    Functional Status Survey: Is the patient deaf or have difficulty hearing?: No Does the patient have difficulty seeing, even when wearing glasses/contacts?: No Does the patient have difficulty concentrating, remembering, or making decisions?: No Does the patient have difficulty walking or climbing stairs?: No Does the patient have  difficulty dressing or bathing?: No Does the patient have difficulty doing errands alone such as visiting a doctor's office or shopping?: No   Assessment & Plan  1. History of lymphoma - No additional follow up per patient; will follow CBC's.  2. Typical atrial flutter ([place]) - Seeing cardiology - nadolol (CORGARD) 20 MG tablet; Take 1 tablet (20 mg total) by mouth daily for 30 days.  Dispense: 30 tablet; Refill: 0  3. Persistent atrial fibrillation - Seeing cardiology - nadolol (CORGARD) 20 MG tablet; Take 1 tablet (20 mg total) by mouth daily for 30 days.  Dispense: 30 tablet; Refill: 0  4. Gastrointestinal hemorrhage, unspecified gastrointestinal hemorrhage type - Seeing GI  5. Alcoholic cirrhosis of liver with ascites ([facility]) - Seeing GI - folic acid (FOLVITE) 1 MG tablet; Take 1 tablet (1 mg total) by mouth daily for 30 days.  Dispense: 30 tablet; Refill: 0  6. Anemia, unspecified type - Followed by GI currently, but will monitor in our clinic if needed  7. Hypokalemia - Had recovered at last check.  Follow up with GI - Potassium Chloride ER 20 MEQ TBCR; Take one tablet twice daily  Dispense: 60 tablet; Refill: 1  8. Financial difficulties - Ambulatory referral to Chronic Care Management Services  9. Alcohol abuse - Congratulated on quitting ETOH abuse.  10. Muscle spasm - Reached out to Dr. Marius Ditch to enquire about safety of muscle relaxer for patient with cirrhosis.

## 2019-02-28 ENCOUNTER — Ambulatory Visit: Payer: Self-pay | Admitting: *Deleted

## 2019-02-28 NOTE — Chronic Care Management (AMB) (Signed)
   Chronic Care Management   Unsuccessful Call Note 02/28/2019 Name: Anita Reed MRN: [id number] DOB: Aug 05, 1961  Anita Reed is a 58 year old female who sees Raelyn Ensign, FNP for primary care. Raelyn Ensign, FNP asked the CCM team to consult the patient for community resources to address patient's financial difficulty.  Referral was placed 02/26/19. Patient's last office visit was 02/26/19.     This social worker was unable to reach patient via telephone today for consent for CCM services to address her financial needs. I have left HIPAA compliant voicemail asking patient to return my call. (unsuccessful outreach #1).   Plan: Will follow-up within 7 business days via telephone.     Elliot Gurney, [place] Administrator, [facility] Care Management [phone]

## 2019-03-05 ENCOUNTER — Telehealth: Payer: Self-pay | Admitting: Family Medicine

## 2019-03-05 DIAGNOSIS — M62838 Other muscle spasm: Secondary | ICD-10-CM

## 2019-03-05 MED ORDER — TIZANIDINE HCL 4 MG PO TABS: 2.0000 mg | ORAL_TABLET | Freq: Three times a day (TID) | ORAL | 0 refills | Status: DC | PRN

## 2019-03-05 NOTE — Telephone Encounter (Signed)
Please let patient know that Dr. Marius Ditch agreed that it was okay for her to take a muscle relaxer.  I have sent in tizanidine for her to try for her muscle spasms.

## 2019-03-05 NOTE — Telephone Encounter (Signed)
Pt.notified

## 2019-03-07 ENCOUNTER — Encounter: Payer: Self-pay | Admitting: *Deleted

## 2019-03-07 ENCOUNTER — Ambulatory Visit: Payer: Self-pay | Admitting: *Deleted

## 2019-03-07 DIAGNOSIS — F1010 Alcohol abuse, uncomplicated: Secondary | ICD-10-CM

## 2019-03-07 DIAGNOSIS — Z599 Problem related to housing and economic circumstances, unspecified: Secondary | ICD-10-CM

## 2019-03-07 DIAGNOSIS — Z598 Other problems related to housing and economic circumstances: Secondary | ICD-10-CM

## 2019-03-07 NOTE — Chronic Care Management (AMB) (Signed)
  Chronic Care Management    Clinical Social Work General Note  03/07/2019 Name: Anita Reed MRN: [id number] DOB: [birth year]  Anita Reed is a 58 y.o. year old female who is a primary care patient of Hubbard Hartshorn, FNP. The CCM was consulted to assist the patient with [employer].   [name] was given information about Chronic Care Management services today including:  1. CCM service includes personalized support from designated clinical staff supervised by her physician, including individualized plan of care and coordination with other care providers 2. 24/7 contact phone numbers for assistance for urgent and routine care needs. 3. Service will only be billed when office clinical staff spend 20 minutes or more in a month to coordinate care. 4. Only one practitioner may furnish and bill the service in a calendar month. 5. The patient may stop CCM services at any time (effective at the end of the month) by phone call to the office staff. 6. The patient will be responsible for cost sharing (co-pay) of up to 20% of the service fee (after annual deductible is met).  Patient agreed to services and verbal consent obtained.   Review of patient status, including review of consultants reports, relevant laboratory and other test results, and collaboration with appropriate care team members and the patient's provider was performed as part of comprehensive patient evaluation and provision of chronic care management services.    SDOH (Social Determinants of Health) screening performed today. See Care Plan Entry related to challenges with: Food Insecurity  Tobacco Use Physical Activity  Goals Addressed            This Visit's Progress   . " I need to know where to get help with my medical bills" (pt-stated)       Current Barriers:  . Financial constraints  Clinical Social Work Clinical Goal(s):  [name] Over the next 90 days, client will work with SW to address concerns related to applying  for food stamps and financial hardship to address unpaid medical bills  Interventions: . Patient interviewed and appropriate assessments performed . Provided patient with information about the possibility of applying for food stamps and financial hardship to address her medical bills . Assisted patient with completing a food stamp application by phone . Advised patient to complete and sign food stamp application when received and mail to the Department of [place] . Discussed financial hardship application and recommendation to apply once she receives her medical bills from previous hospitalizations  Patient Self Care Activities:  . Self administers medications as prescribed . Performs ADL's independently . Performs IADL's independently . Calls provider office for new concerns or questions  Initial goal documentation         Follow Up Plan: SW will follow up with patient by phone over the next 2 weeks       [place], [place] Care Management [phone]

## 2019-03-07 NOTE — Patient Instructions (Addendum)
Thank you allowing the Chronic Care Management Team to be a part of your care! It was a pleasure speaking with you today!  1. Please sign and mail food stamp application in the enclosed envelope provided to the Department of [place] 2. Please call this social worker with any questions or concerns regarding your financial needs.  CCM (Chronic Care Management) Team   Trish Fountain RN, BSN Nurse Care Coordinator  [phone]  Ruben Reason PharmD  Clinical Pharmacist  [phone]   [place], LCSW Clinical Social Worker [phone]  Goals Addressed            This Visit's Progress   . " I need to know where to get help with my medical bills" (pt-stated)       Current Barriers:  . Financial constraints  Clinical Social Work Clinical Goal(s):  [name] Over the next 90 days, client will work with SW to address concerns related to applying for food stamps and financial hardship to address unpaid medical bills  Interventions: . Patient interviewed and appropriate assessments performed . Provided patient with information about the possibility of applying for food stamps and financial hardship to address her medical bills . Assisted patient with completing a food stamp application by phone . Advised patient to complete and sign food stamp application when received and mail to the Department of [place] . Discussed financial hardship application and recommendation to apply once she receives her medical bills from previous hospitalizations  Patient Self Care Activities:  . Self administers medications as prescribed . Performs ADL's independently . Performs IADL's independently . Calls provider office for new concerns or questions  Initial goal documentation         The patient verbalized understanding of instructions provided today and declined a print copy of patient instruction materials.   Telephone follow up  appointment with care management team member scheduled for:03/22/19

## 2019-03-08 NOTE — Progress Notes (Signed)
This encounter was created in error - please disregard.

## 2019-03-12 ENCOUNTER — Ambulatory Visit: Payer: Self-pay | Admitting: *Deleted

## 2019-03-12 ENCOUNTER — Encounter: Payer: Self-pay | Admitting: *Deleted

## 2019-03-12 NOTE — Progress Notes (Signed)
This encounter was created in error - please disregard.

## 2019-03-12 NOTE — Chronic Care Management (AMB) (Signed)
  Chronic Care Management   Social Work Note  03/12/2019 Name: Anita Reed MRN: [id number] DOB: 09-01-1960  Anita Reed is a 58 y.o. year old female who sees Hubbard Hartshorn, FNP for primary care. The CCM team was consulted for assistance with [employer].   This Education officer, museum mailed the food stamp application that was completed by phone to patient requesting signature. Patient to sign form and mail to the Department of [place] office for processing.  Goals Addressed   None     Follow Up Plan: SW will follow up with patient by phone over the next 2 weeks  [place], [place] Worker  [facility] Care Management [phone]

## 2019-03-13 ENCOUNTER — Ambulatory Visit: Payer: Medicare Other | Admitting: Cardiovascular Disease

## 2019-03-20 ENCOUNTER — Other Ambulatory Visit: Payer: Self-pay | Admitting: Family Medicine

## 2019-03-20 DIAGNOSIS — M62838 Other muscle spasm: Secondary | ICD-10-CM

## 2019-03-22 ENCOUNTER — Ambulatory Visit: Payer: Self-pay | Admitting: *Deleted

## 2019-03-22 ENCOUNTER — Ambulatory Visit: Payer: Medicare Other | Admitting: *Deleted

## 2019-03-22 DIAGNOSIS — F1010 Alcohol abuse, uncomplicated: Secondary | ICD-10-CM

## 2019-03-22 DIAGNOSIS — Z599 Problem related to housing and economic circumstances, unspecified: Secondary | ICD-10-CM

## 2019-03-22 DIAGNOSIS — Z598 Other problems related to housing and economic circumstances: Secondary | ICD-10-CM

## 2019-03-22 NOTE — Chronic Care Management (AMB) (Signed)
   Chronic Care Management   Unsuccessful Call Note 03/22/2019 Name: Anita Reed MRN: [id number] DOB: 1961/04/23  Patient  is a 58 year old female who sees Raelyn Ensign, [place] for primary care. Raelyn Ensign, FNP asked the CCM team to consult the patient for community resources.      This social worker was unable to reach patient via telephone today for follow up on food stamp application sent and application for financial hardship . I have left HIPAA compliant voicemail asking patient to return my call. (unsuccessful outreach #1).   Plan: Will follow-up within 7 business days via telephone.     Elliot Gurney, [place] Administrator, [facility] Center/[facility] Care Management [phone]

## 2019-03-22 NOTE — Chronic Care Management (AMB) (Signed)
  Chronic Care Management    Clinical Social Work Follow Up Note  03/22/2019 Name: Anita Reed MRN: [id number] DOB: 02-Feb-1961  Anita Reed is a 58 y.o. year old female who is a primary care patient of Hubbard Hartshorn, FNP. The CCM team was consulted for assistance with [employer].   Review of patient status, including review of consultants reports, other relevant assessments, and collaboration with appropriate care team members and the patient's provider was performed as part of comprehensive patient evaluation and provision of chronic care management services.     Goals Addressed            This Visit's Progress   . " I need to know where to get help with my medical bills" (pt-stated)       Current Barriers:  . Financial constraints  Clinical Social Work Clinical Goal(s):  [name] Over the next 90 days, client will work with SW to address concerns related to applying for food stamps and financial hardship to address unpaid medical bills  Interventions: . Patient interviewed and appropriate assessments performed . Patient confirmed that she has received the food stamp application that was sent for her signature, however has decided against submitting it as well as the hardship application due to her spouse not wanting to provide his income information. . Patient agrees to contact the business office at the hospital to make payment arrangements for outstanding medical bills . Assessed for safety concerns related to patient and her spouse(patient denies domestic violence at this time) and offered support services if needed in the future.   Patient Self Care Activities:  . Self administers medications as prescribed . Performs ADL's independently . Performs IADL's independently . Calls provider office for new concerns or questions  Please see past updates related to this goal by clicking on the "Past Updates" button in the selected goal          Follow Up Plan: Client will  contact the business office at the hospital to discuss payment arrangements for unpaid medical bills  Patient will call this social worker in the future if needed.   Elliot Gurney, [place] Administrator[facility] [phone]

## 2019-03-22 NOTE — Patient Instructions (Signed)
Thank you allowing the Chronic Care Management Team to be a part of your care! It was a pleasure speaking with you today!  1. Please contact the business office at the hospital to discuss any possible payment arrangements for outstanding medical bills. 2. Please call the CCM social worker if you have any additional community resource needs    CCM (Chronic Care Management) Team   Trish Fountain RN, BSN Nurse Care Coordinator  [phone]  Ruben Reason PharmD  Clinical Pharmacist  [phone]   [place], LCSW Clinical Social Worker [phone]  Goals Addressed            This Visit's Progress   . " I need to know where to get help with my medical bills" (pt-stated)       Current Barriers:  . Financial constraints  Clinical Social Work Clinical Goal(s):  [name] Over the next 90 days, client will work with SW to address concerns related to applying for food stamps and financial hardship to address unpaid medical bills  Interventions: . Patient interviewed and appropriate assessments performed . Patient confirmed that she has received the food stamp application that was sent for her signature, however has decided against submitting it as well as the hardship application due to her spouse not wanting to provide his income information. . Patient agrees to contact the business office at the hospital to make payment arrangements for outstanding medical bills . Assessed for safety concerns related to patient and her spouse(patient denies domestic violence at this time) and offered support services if needed in the future.   Patient Self Care Activities:  . Self administers medications as prescribed . Performs ADL's independently . Performs IADL's independently . Calls provider office for new concerns or questions  Please see past updates related to this goal by clicking on the "Past Updates" button in the selected goal          The patient verbalized understanding  of instructions provided today and declined a print copy of patient instruction materials.   No further follow up required: patient to call this social worker with any community resource needs in the future

## 2019-03-26 ENCOUNTER — Other Ambulatory Visit: Payer: Self-pay | Admitting: Gastroenterology

## 2019-03-26 ENCOUNTER — Ambulatory Visit
Admission: RE | Admit: 2019-03-26 | Discharge: 2019-03-26 | Disposition: A | Discharge status: Home / Self Care | Payer: Medicare Other | Source: Ambulatory Visit | Attending: Gastroenterology | Admitting: Gastroenterology

## 2019-03-26 ENCOUNTER — Other Ambulatory Visit: Payer: Self-pay

## 2019-03-26 ENCOUNTER — Encounter: Payer: Self-pay | Admitting: Gastroenterology

## 2019-03-26 ENCOUNTER — Telehealth: Payer: Self-pay | Admitting: Gastroenterology

## 2019-03-26 ENCOUNTER — Ambulatory Visit (INDEPENDENT_AMBULATORY_CARE_PROVIDER_SITE_OTHER): Payer: Medicare Other | Admitting: Gastroenterology

## 2019-03-26 VITALS — BP 108/55 | HR 79 | Temp 100.4°F | Wt 142.2 lb

## 2019-03-26 DIAGNOSIS — K7290 Hepatic failure, unspecified without coma: Secondary | ICD-10-CM | POA: Insufficient documentation

## 2019-03-26 DIAGNOSIS — K7460 Unspecified cirrhosis of liver: Secondary | ICD-10-CM

## 2019-03-26 DIAGNOSIS — R1084 Generalized abdominal pain: Secondary | ICD-10-CM | POA: Diagnosis not present

## 2019-03-26 DIAGNOSIS — R509 Fever, unspecified: Secondary | ICD-10-CM | POA: Diagnosis not present

## 2019-03-26 IMAGING — US ULTRASOUND ABDOMEN LIMITED
1 series · 4 of 4 positions shown · non-contrast
Comparison: [DATE]

CLINICAL DATA: Cirrhosis, abdominal pain, distension, assess for
ascites

EXAM:
LIMITED ABDOMEN ULTRASOUND FOR ASCITES
TECHNIQUE: Limited ultrasound survey for ascites was performed in all four
abdominal quadrants.

[Series 1: ultrasound abdomen limited · 4 of 4 slices shown]
[im 1/4]
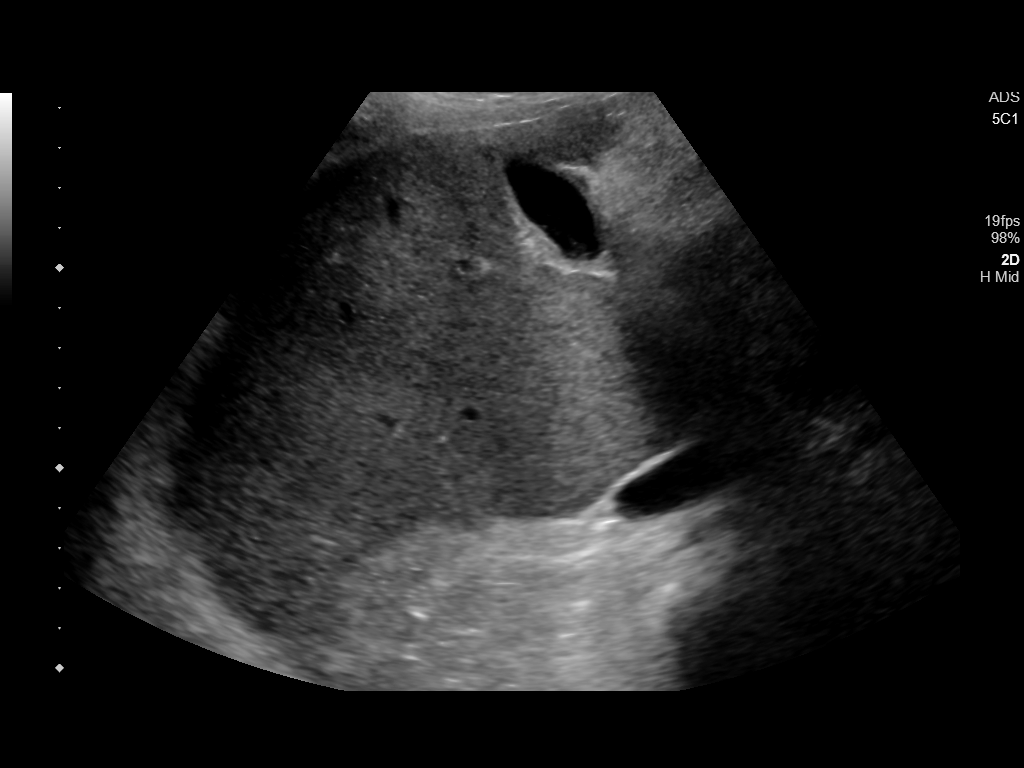
[im 2/4]
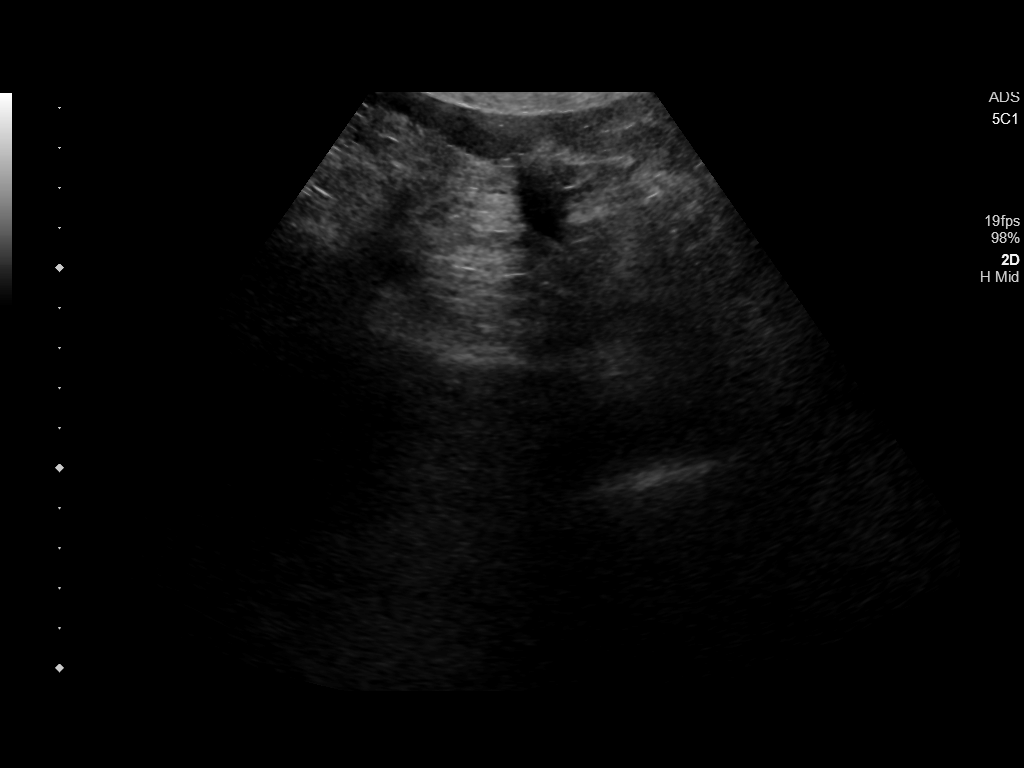
[im 3/4]
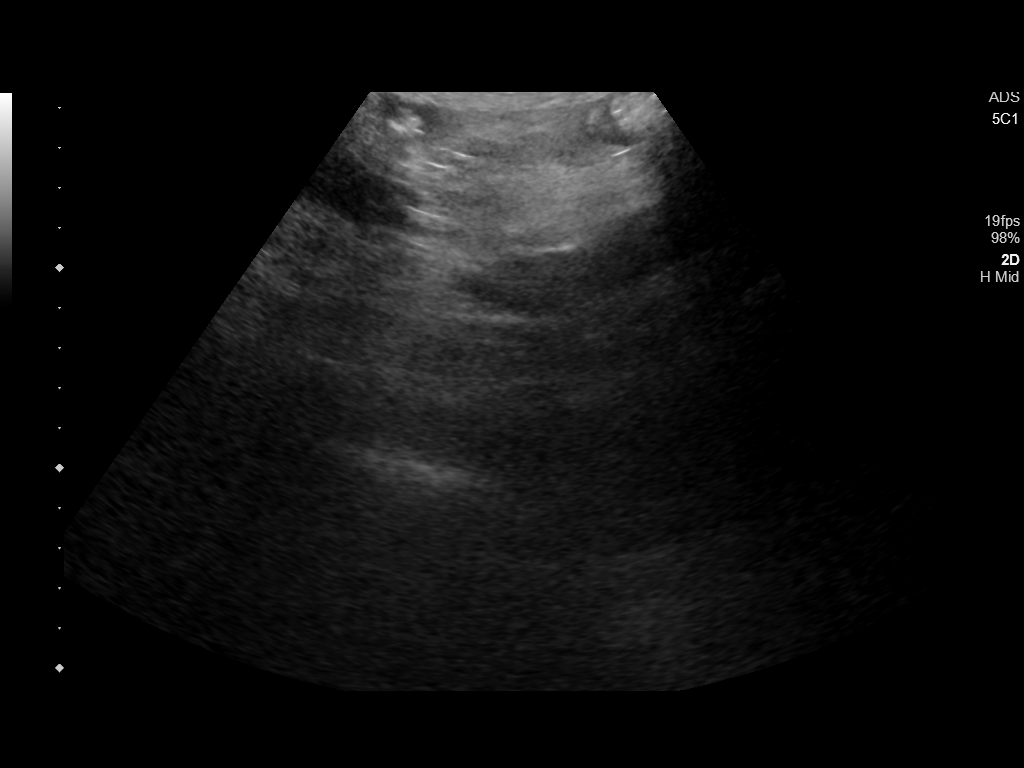
[im 4/4]
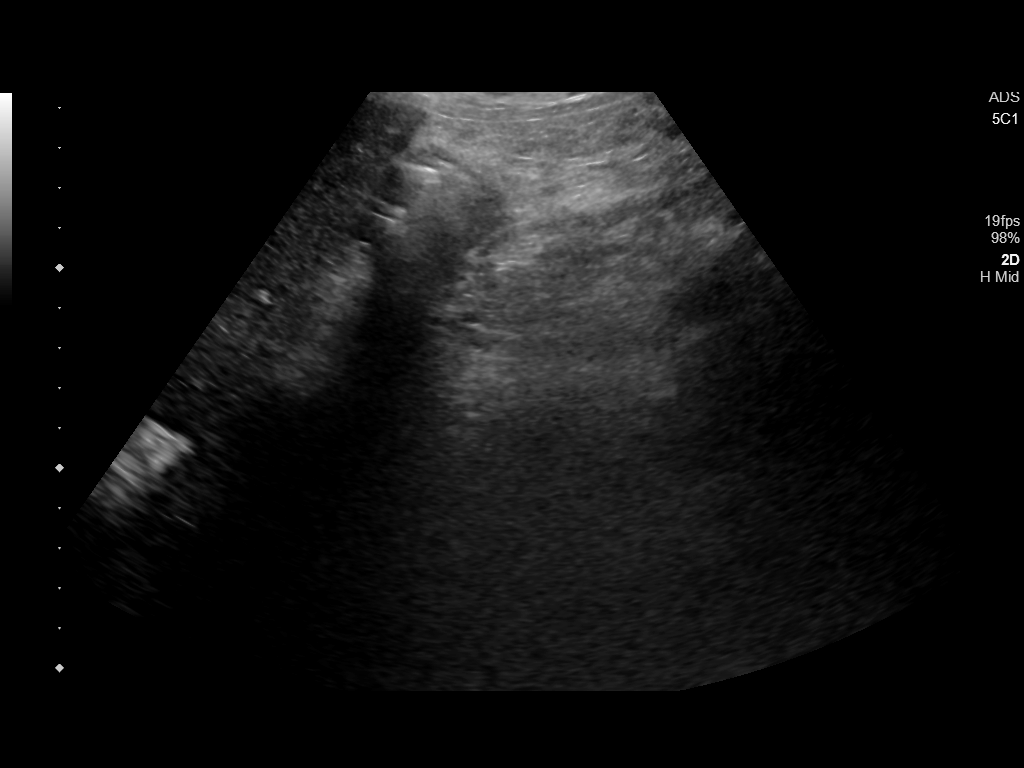

[4 of 4 positions shown; findings below may reference images not displayed]

FINDINGS: Survey of the abdominal 4 quadrants demonstrates no significant
abdominopelvic ascites that warrants paracentesis. Images obtained
for documentation. Procedure not performed.
IMPRESSION: No significant abdominopelvic ascites by ultrasound.

## 2019-03-26 NOTE — Progress Notes (Signed)
Cephas Darby, MD [address]  [place], [place] [address]  Main: [phone]  Fax: [phone]    Gastroenterology Consultation  Referring Provider:     No ref. provider found Primary Care Physician:  Hubbard Hartshorn, FNP Primary Gastroenterologist:  Dr. Cephas Darby Reason for Consultation:     Decompensated alcoholic cirrhosis        HPI:   Anita Reed is a 58 y.o. female is here for hospital follow-up and management of decompensated alcoholic cirrhosis.  Patient is recently diagnosed with cirrhosis of liver in 12/2018 when she presented to [facility] with abdominal distention secondary to ascites which is consistent with portal hypertension based on fluid analysis, SSAG > 1.1.  She was discharged home on diuretics which she was not taking.  She then readmitted to [facility] secondary to active upper GI bleed about 2 weeks ago, EGD revealed Dieulafoy's lesion that was clipped.  She also had ascites at that time and underwent therapeutic paracentesis.  She was discharged home on furosemide 40 mg twice daily and spironolactone 150 mg daily, nadolol.  Her hemoglobin on discharge was 8.6.  Patient had chronic hep C which was treated in the past.  Viral load undetectable  Interval summary Patient reports that she did not catch even a drop of alcohol since last hospital admission.  She said in front of her husband that they removed all alcohol from her house.  She has been taking furosemide 40 mg twice daily but not spironolactone.  She is on Protonix 40 mg twice daily, also taking nadolol 20 mg daily.  She denies swelling in her legs but feels very gassy, abdomen distended.  She reports having irregular bowel habits chronically.  She reports her stools are orange-colored but not black or red.  She is trying to eat more protein.  Patient was seen on a tele-visit by Dr. Rockey Situ 2 days ago for a flutter which was thought to be secondary to hypokalemia.  Anticoagulation was deferred due to  recent GI bleed  Follow-up visit 03/26/2019 Patient has fever of 100.4, she does report diffuse abdominal pain, she thinks she is gassy and constipated from taking prenatal vitamins.  She is taking MiraLAX as needed and cut back on prenatal vitamins to every 3 days.  She denies subjective fevers but she had temp of 100.4 in our office today.  She said she was in the grocery store about a week ago, wearing mask.  She was tested negative for COVID twice in last 3 months.  She denies dark urine, yellowing of skin, black stools, hematemesis.  She reports not drinking alcohol but continues to smoke cigarettes  NSAIDs: None  Antiplts/Anticoagulants/Anti thrombotics: None  GI Procedures: EGD 01/29/2019 - Normal duodenal bulb and second portion of the duodenum. - Severe portal hypertensive gastropathy in gastric fundus. - Large (> 5 mm) varices were found in the lower third of the esophagus, with no red wale signs or stigmata of recent bleed. - 2 cm hiatal hernia. - Dieulafoy lesion of stomach. - No specimens collected.  Patient reports having had several colonoscopies in the past  Past Medical History:  Diagnosis Date  . Anemia   . Cancer ([place])   . Cirrhosis ([place])   . Hypertension     Past Surgical History:  Procedure Laterality Date  . ABDOMINAL HYSTERECTOMY    . APPENDECTOMY    . ESOPHAGOGASTRODUODENOSCOPY N/A 01/29/2019   Procedure: ESOPHAGOGASTRODUODENOSCOPY (EGD);  Surgeon: [name], MD;  Location: [facility] ENDOSCOPY;  Service: Gastroenterology;  Laterality: N/A;  . TONSILLECTOMY      Current Outpatient Medications:  .  nadolol (CORGARD) 20 MG tablet, Take 1 tablet (20 mg total) by mouth daily for 30 days., Disp: 30 tablet, Rfl: 0 .  pantoprazole (PROTONIX) 40 MG tablet, Take 1 tablet (40 mg total) by mouth 2 (two) times daily., Disp: 60 tablet, Rfl: 11 .  Potassium Chloride ER 20 MEQ TBCR, Take one tablet twice daily, Disp: 60 tablet, Rfl: 1 .  spironolactone (ALDACTONE) 50  MG tablet, Take 2 tablets (100 mg total) by mouth daily., Disp: 180 tablet, Rfl: 1 .  Thiamine HCl (VITAMIN B-1 PO), Take by mouth., Disp: , Rfl:  .  folic acid (FOLVITE) 1 MG tablet, Take 1 tablet (1 mg total) by mouth daily for 30 days. (Patient not taking: Reported on 03/26/2019), Disp: 30 tablet, Rfl: 0 .  furosemide (LASIX) 40 MG tablet, Take 1 tablet (40 mg total) by mouth 2 (two) times daily. (Patient not taking: Reported on 03/26/2019), Disp: 60 tablet, Rfl: 3 .  tiZANidine (ZANAFLEX) 4 MG tablet, TAKE 1/2 TO 1 (ONE-HALF TO ONE) TABLET BY MOUTH EVERY 8 HOURS AS NEEDED FOR MUSCLE SPASM (Patient not taking: Reported on 03/26/2019), Disp: 30 tablet, Rfl: 2  Family History  Problem Relation Age of Onset  . Cancer Mother   . Cancer Father      Social History   Tobacco Use  . Smoking status: Current Every Day Smoker  . Smokeless tobacco: Never Used  Substance Use Topics  . Alcohol use: Not Currently  . Drug use: Never    Allergies as of 03/26/2019  . (No Known Allergies)    Review of Systems:    All systems reviewed and negative except where noted in HPI.   Physical Exam:  BP (!) 108/55   Pulse 79   Temp (!) 100.4 F (38 C) (Oral)   Wt 142 lb 3.2 oz (64.5 kg)   BMI 21.94 kg/m  No LMP recorded. Patient has had a hysterectomy.  General:   Alert, thin built, poorly nourished, pleasant and cooperative in NAD Head:  Normocephalic and atraumatic, bitemporal wasting. Eyes:  Sclera positive icterus.   Muddy conjunctiva Ears:  Normal auditory acuity. Nose:  No deformity, discharge, or lesions. Mouth:  No deformity or lesions,oropharynx pink & moist. Neck:  Supple; no masses or thyromegaly. Lungs:  Respirations even and unlabored.  Clear throughout to auscultation.   No wheezes, crackles, or rhonchi. No acute distress. Heart:  Regular rate and rhythm; no murmurs, clicks, rubs, or gallops. Abdomen:  Normal bowel sounds. Soft, non-tender and diffusely distended, mildly tender,  dullness in bilateral flanks without masses, hepatosplenomegaly or hernias noted.  No guarding or rebound tenderness.   Rectal: Not performed Msk:  Symmetrical without gross deformities. Good, equal movement & strength bilaterally. Pulses:  Normal pulses noted. Extremities:  No clubbing or edema.  No cyanosis. Neurologic:  Alert and oriented x3;  grossly normal neurologically. Skin:  Intact without significant lesions or rashes. No jaundice. Psych:  Alert and cooperative. Normal mood and affect.  Imaging Studies: Reviewed  Assessment and Plan:   Anita Reed is a 58 y.o. female with history of chronic hep C, status post treatment, SVR, decompensated alcoholic cirrhosis with ascites, esophageal varices, upper GI bleed secondary to Dieulafoy's acute hemostasis is here for follow-up  Fever, abdominal distention, pain Need to evaluate for SBP  Recommend urgent diagnostic paracentesis with fluid analysis, spoke with radiology,  scheduled for paracentesis today Check CBC with differential, LFTs  Decompensated cirrhosis of liver, secondary to alcohol use Hepatitis A and B serologies negative, administered Chronic hep C, cured, status post treatment Check ferritin levels Portal hypertension manifested as esophageal varices, ascites Ascites: Moderate, decrease furosemide to 40 mg daily, spironolactone 100 mg daily 2 g sodium diet, check BMP, continue oral potassium Esophageal varices: Nonbleeding, continue nadolol 20 mg daily Anemia: Recheck CBC, iron studies Continue prenatal multivitamin HCC screening: Check serum AFP levels, CT abdomen and pelvis in 12/2018 did not detect any liver lesions HRS: None Hepatic encephalopathy: Not present Reiterated to her about complete abstinence from alcohol Recommend Twinrix, pneumonia vaccines: Administered first dose of Twinrix and Prevnar on 02/09/2019 Will administer first booster of Twinrix during next visit  Follow up in 4 weeks   Cephas Darby, MD

## 2019-03-26 NOTE — Telephone Encounter (Signed)
Patient called stating she had her u/s done & there was no fluid in her stomach. She will be seeing Raelyn Ensign her pcp on 04-02-19 to have labs drawn. If you would like herto have labs done  let them know so she can have drawn at one time.

## 2019-03-27 NOTE — Telephone Encounter (Signed)
If she is still having fever today, let us start her on ciprofloxacin 500 mg twice daily for 5 days She can get CBC and CMP that I ordered yesterday at her PCPs office  Thanks RV

## 2019-03-27 NOTE — Telephone Encounter (Signed)
Left vm for pt to return my call.  

## 2019-03-27 NOTE — Telephone Encounter (Signed)
Pt stated she is not running a fever today. At the hospital yesterday, it was 98.7 and today when she checked it, it was 96.8. I have advised her you did want to order labs and you have sent that message to her PCP so she will be aware at her appt on 04/02/19.

## 2019-04-02 ENCOUNTER — Ambulatory Visit (INDEPENDENT_AMBULATORY_CARE_PROVIDER_SITE_OTHER): Payer: Medicare Other | Admitting: Family Medicine

## 2019-04-02 ENCOUNTER — Encounter: Payer: Self-pay | Admitting: Family Medicine

## 2019-04-02 ENCOUNTER — Other Ambulatory Visit (HOSPITAL_COMMUNITY)
Admission: RE | Admit: 2019-04-02 | Discharge: 2019-04-02 | Disposition: A | Discharge status: Home / Self Care | Payer: Medicare Other | Source: Ambulatory Visit | Attending: Family Medicine | Admitting: Family Medicine

## 2019-04-02 ENCOUNTER — Other Ambulatory Visit: Payer: Self-pay

## 2019-04-02 VITALS — BP 108/70 | HR 85 | Temp 97.5°F | Resp 16 | Ht 68.0 in | Wt 145.0 lb

## 2019-04-02 DIAGNOSIS — Z124 Encounter for screening for malignant neoplasm of cervix: Secondary | ICD-10-CM | POA: Diagnosis not present

## 2019-04-02 DIAGNOSIS — M62838 Other muscle spasm: Secondary | ICD-10-CM | POA: Diagnosis not present

## 2019-04-02 DIAGNOSIS — R509 Fever, unspecified: Secondary | ICD-10-CM

## 2019-04-02 DIAGNOSIS — E876 Hypokalemia: Secondary | ICD-10-CM

## 2019-04-02 DIAGNOSIS — Z01419 Encounter for gynecological examination (general) (routine) without abnormal findings: Secondary | ICD-10-CM | POA: Diagnosis not present

## 2019-04-02 DIAGNOSIS — K7460 Unspecified cirrhosis of liver: Secondary | ICD-10-CM

## 2019-04-02 DIAGNOSIS — K7031 Alcoholic cirrhosis of liver with ascites: Secondary | ICD-10-CM | POA: Diagnosis not present

## 2019-04-02 DIAGNOSIS — I483 Typical atrial flutter: Secondary | ICD-10-CM | POA: Diagnosis not present

## 2019-04-02 DIAGNOSIS — Z13220 Encounter for screening for lipoid disorders: Secondary | ICD-10-CM

## 2019-04-02 DIAGNOSIS — R1084 Generalized abdominal pain: Secondary | ICD-10-CM

## 2019-04-02 DIAGNOSIS — K7290 Hepatic failure, unspecified without coma: Secondary | ICD-10-CM | POA: Diagnosis not present

## 2019-04-02 MED ORDER — TIZANIDINE HCL 4 MG PO TABS: 2.0000 mg | ORAL_TABLET | Freq: Three times a day (TID) | ORAL | 0 refills | Status: DC | PRN

## 2019-04-02 NOTE — Addendum Note (Signed)
Addended by: Docia Furl on: 04/02/2019 02:49 PM   Modules accepted: Orders

## 2019-04-02 NOTE — Progress Notes (Addendum)
Name: Anita Reed   MRN: [id number]    DOB: [birth year]   Date:04/02/2019       Progress Note  Subjective  Chief Complaint  Chief Complaint  Patient presents with  . Annual Exam    HPI  Patient presents for pap smear, and brief follow up.    BLE Edema/Cirrhosis: She has been seen by Dr. Rockey Situ  And Dr. Marius Ditch recently for her Afib and Cirrhosis respectively.  She does not ongoing BLE edema that she feels has been significant.  She does have some ongoig shortness of breat.  Diet: Chicken and fish; not limiting salt intake at this time.  Exercise: She is not exercising at all.   USPSTF grade A and B recommendations  She has not been drinking any alcohol since her cirrhosis diagnosis.   Office Visit from 04/02/2019 in [facility]  AUDIT-C Score  0     Depression: Phq 9 is  negative Depression screen [facility] 2/9 04/02/2019 03/07/2019 02/26/2019  Decreased Interest 0 0 0  Down, Depressed, Hopeless 0 0 0  PHQ - 2 Score 0 0 0  Altered sleeping 0 - 0  Tired, decreased energy 0 - 0  Change in appetite 0 - 0  Feeling bad or failure about yourself  0 - 0  Trouble concentrating 0 - 0  Moving slowly or fidgety/restless 0 - 0  Suicidal thoughts 0 - 0  PHQ-9 Score 0 - 0  Difficult doing work/chores Not difficult at all - Not difficult at all   Hypertension: BP Readings from Last 3 Encounters:  04/02/19 108/70  03/26/19 (!) 108/55  02/26/19 96/62   Obesity: Wt Readings from Last 3 Encounters:  04/02/19 145 lb (65.8 kg)  03/26/19 142 lb 3.2 oz (64.5 kg)  02/26/19 136 lb 9.6 oz (62 kg)   BMI Readings from Last 3 Encounters:  04/02/19 22.05 kg/m  03/26/19 21.94 kg/m  02/26/19 21.08 kg/m    Hep C Screening: Negative June 2020. STD testing and prevention (HIV/chl/gon/syphilis): HIV negative in May 2020. Declines additional testing Intimate partner violence: No concnerns Sexual History/Pain during Intercourse: No concerns Menstrual History/LMP/Abnormal Bleeding:  No  vaginal bleeding she is s/p hysterectomy - things she still has left ovary intact, but is unsure.  Had hysterectomy due to recurrent cysts at age 58yo. Incontinence Symptoms: Occasional, but nothing that she is concerned about today.  Advanced Care Planning: A voluntary discussion about advance care planning including the explanation and discussion of advance directives.  Discussed health care proxy and Living will, and the patient was able to identify a health care proxy as her husband.  Patient does not have a living will at present time. If patient does have living will, I have requested they bring this to the clinic to be scanned in to their chart.  Breast cancer: Due for mammogram - she strongly declines today.  No results found for: [facility]  BRCA gene screening: Mother had breast cancer Cervical cancer screening: S/p hysterectomy, will check Pap smear today.  Osteoporosis Screening: Declines today; has not had baseline screening.  No results found for: HMDEXASCAN  Lipids: Due for this today. No results found for: CHOL No results found for: HDL No results found for: LDLCALC No results found for: TRIG No results found for: CHOLHDL No results found for: LDLDIRECT  Glucose:  Glucose, Bld  Date Value Ref Range Status  02/09/2019 148 (H) 70 - 99 mg/dL Final  01/31/2019 112 (H) 70 - 99 mg/dL Final  01/29/2019 112 (H) 70 - 99 mg/dL Final   Skin cancer: No concerning lesions per patient. Colorectal cancer: Denies family or personal history of colorectal cancer, no changes in BM's - no blood in stool, dark and tarry stool, mucus in stool; does endorse constipation, but not diarrhea. She refuses colonoscopy referral. Also refuses cologuard. Lung cancer:  Smoking 1ppd for about 37 years.  Low Dose CT Chest recommended if Age 58-80 years, 30 pack-year currently smoking OR have quit w/in 15years. Patient does qualify.  She refuses the order today. ECG: No chest pain, shortness of breath, or  palpitations.  She does see Dr. Rockey Situ for Afib and is taking medication.   Patient Active Problem List   Diagnosis Date Noted  . History of lymphoma 02/26/2019  . Typical atrial flutter ([place]) 02/07/2019  . Alcoholic cirrhosis of liver with ascites ([place]) 02/07/2019  . Persistent atrial fibrillation 02/07/2019  . Anemia 02/07/2019  . Anorexia 02/07/2019  . GI bleed 01/28/2019  . Ascites 01/18/2019  . Cancer [facility])     Past Surgical History:  Procedure Laterality Date  . ABDOMINAL HYSTERECTOMY    . APPENDECTOMY    . ESOPHAGOGASTRODUODENOSCOPY N/A 01/29/2019   Procedure: ESOPHAGOGASTRODUODENOSCOPY (EGD);  Surgeon: [name], MD;  Location: [facility] ENDOSCOPY;  Service: Gastroenterology;  Laterality: N/A;  . TONSILLECTOMY      Family History  Problem Relation Age of Onset  . Cancer Mother   . Cancer Father     Social History   Socioeconomic History  . Marital status: Married    Spouse name: [name]  . Number of children: 1  . Years of education: 76  . Highest education level: 9th grade  Occupational History  . Occupation: disabled  Social Needs  . Financial resource strain: Somewhat hard  . Food insecurity    Worry: Sometimes true    Inability: Sometimes true  . Transportation needs    Medical: No    Non-medical: No  Tobacco Use  . Smoking status: Current Every Day Smoker  . Smokeless tobacco: Never Used  Substance and Sexual Activity  . Alcohol use: Not Currently  . Drug use: Never  . Sexual activity: Not Currently  Lifestyle  . Physical activity    Days per week: 0 days    Minutes per session: 0 min  . Stress: Only a little  Relationships  . Social Herbalist on phone: Once a week    Gets together: Never    Attends religious service: Never    Active member of club or organization: No    Attends meetings of clubs or organizations: Never    Relationship status: Married  . Intimate partner violence    Fear of current or ex partner: No     Emotionally abused: No    Physically abused: No    Forced sexual activity: No  Other Topics Concern  . Not on file  Social History Narrative  . Not on file     Current Outpatient Medications:  .  furosemide (LASIX) 40 MG tablet, Take 1 tablet (40 mg total) by mouth 2 (two) times daily., Disp: 60 tablet, Rfl: 3 .  pantoprazole (PROTONIX) 40 MG tablet, Take 1 tablet (40 mg total) by mouth 2 (two) times daily., Disp: 60 tablet, Rfl: 11 .  spironolactone (ALDACTONE) 50 MG tablet, Take 2 tablets (100 mg total) by mouth daily., Disp: 180 tablet, Rfl: 1 .  Thiamine HCl (VITAMIN B-1 PO), Take by mouth., Disp: , Rfl:  .  nadolol (CORGARD) 20 MG tablet, Take 1 tablet (20 mg total) by mouth daily for 30 days., Disp: 30 tablet, Rfl: 0 .  Potassium Chloride ER 20 MEQ TBCR, Take one tablet twice daily (Patient not taking: Reported on 04/02/2019), Disp: 60 tablet, Rfl: 1 .  tiZANidine (ZANAFLEX) 4 MG tablet, TAKE 1/2 TO 1 (ONE-HALF TO ONE) TABLET BY MOUTH EVERY 8 HOURS AS NEEDED FOR MUSCLE SPASM (Patient not taking: Reported on 03/26/2019), Disp: 30 tablet, Rfl: 2  No Known Allergies   ROS  Constitutional: Negative for fever or weight change.  Respiratory: Negative for cough and shortness of breath.   Cardiovascular: Negative for chest pain or palpitations.  Gastrointestinal: She notes new onset lump in central abdomen just above the umbilicus.  She has had more constipation lately (after starting iron supplement). The area is tender but has not been discolored.  It has been causing significant discomfort with BM's.  Musculoskeletal: Negative for gait problem or joint swelling.  Skin: Negative for rash.  Neurological: Negative for dizziness or headache.  No other specific complaints in a complete review of systems (except as listed in HPI above).   Objective  Vitals:   04/02/19 1340  BP: 108/70  Pulse: 85  Resp: 16  Temp: (!) 97.5 F (36.4 C)  TempSrc: Oral  SpO2: 94%  Weight: 145 lb (65.8  kg)  Height: '5\' 8"'$  (1.727 m)    Body mass index is 22.05 kg/m.  Physical Exam  Constitutional: Patient appears well-developed and well-nourished. No distress.  HENT: Head: Normocephalic and atraumatic. Ears: B TMs ok, no erythema or effusion; Nose: Nose normal. Mouth/Throat: Oropharynx is clear and moist. No oropharyngeal exudate.  Eyes: Conjunctivae and EOM are normal. Pupils are equal, round, and reactive to light. No scleral icterus.  Neck: Normal range of motion. Neck supple. No JVD present. No thyromegaly present.  Cardiovascular: Normal rate, regular rhythm and normal heart sounds.  No murmur heard. No BLE edema that is appreciable from foot to thigh. Pulmonary/Chest: Effort normal and breath sounds normal. No respiratory distress. Abdominal: Soft. Bowel sounds are normal, no distension. There is no tenderness. there is an area just above the umbilicus that has a triangular indentation in the muscle - there is no tenderness on palpation with flexion, no reducible hernia. Breast: no lumps or masses, no nipple discharge or rashes FEMALE GENITALIA:  External genitalia normal External urethra normal Vaginal vault normal without discharge or lesions Cervix normal without discharge or lesions Bimanual exam normal without masses Musculoskeletal: Normal range of motion, no joint effusions. No gross deformities Neurological: he is alert and oriented to person, place, and time. No cranial nerve deficit. Coordination, balance, strength, speech and gait are normal.  Skin: Skin is warm and dry. No rash noted. No erythema.  Psychiatric: Patient has a normal mood and affect. behavior is normal. Judgment and thought content normal.   Recent Results (from the past 2160 hour(s))  Brain natriuretic peptide     Status: Abnormal   Collection Time: 01/18/19 12:20 PM  Result Value Ref Range   B Natriuretic Peptide 771.0 (H) 0.0 - 100.0 pg/mL    Comment: Performed at [facility], [address] [place], [place] [address]  CBC with Differential     Status: Abnormal   Collection Time: 01/18/19 12:20 PM  Result Value Ref Range   WBC 14.4 (H) 4.0 - 10.5 K/uL   RBC 3.23 (L) 3.87 - 5.11 MIL/uL   Hemoglobin 7.5 (L) 12.0 - 15.0  g/dL    Comment: Reticulocyte Hemoglobin testing may be clinically indicated, consider ordering this additional test [id number]    HCT 24.7 (L) 36.0 - 46.0 %   MCV 76.5 (L) 80.0 - 100.0 fL   MCH 23.2 (L) 26.0 - 34.0 pg   MCHC 30.4 30.0 - 36.0 g/dL   RDW 16.9 (H) 11.5 - 15.5 %   Platelets 379 150 - 400 K/uL   nRBC 0.0 0.0 - 0.2 %   Neutrophils Relative % 73 %   Neutro Abs 10.6 (H) 1.7 - 7.7 K/uL   Lymphocytes Relative 13 %   Lymphs Abs 1.9 0.7 - 4.0 K/uL   Monocytes Relative 11 %   Monocytes Absolute 1.6 (H) 0.1 - 1.0 K/uL   Eosinophils Relative 1 %   Eosinophils Absolute 0.2 0.0 - 0.5 K/uL   Basophils Relative 1 %   Basophils Absolute 0.1 0.0 - 0.1 K/uL   Immature Granulocytes 1 %   Abs Immature Granulocytes 0.08 (H) 0.00 - 0.07 K/uL    Comment: Performed at [facility], [address] [place], [place] [address]  Comprehensive metabolic panel     Status: Abnormal   Collection Time: 01/18/19 12:20 PM  Result Value Ref Range   Sodium 127 (L) 135 - 145 mmol/L   Potassium 4.5 3.5 - 5.1 mmol/L   Chloride 96 (L) 98 - 111 mmol/L   CO2 19 (L) 22 - 32 mmol/L   Glucose, Bld 109 (H) 70 - 99 mg/dL   BUN 8 6 - 20 mg/dL   Creatinine, Ser 0.49 0.44 - 1.00 mg/dL   Calcium 9.0 8.9 - 10.3 mg/dL   Total Protein 7.0 6.5 - 8.1 g/dL   Albumin 4.1 3.5 - 5.0 g/dL   AST 85 (H) 15 - 41 U/L   ALT 31 0 - 44 U/L   Alkaline Phosphatase 404 (H) 38 - 126 U/L   Total Bilirubin 2.0 (H) 0.3 - 1.2 mg/dL   GFR calc non Af Amer >60 >60 mL/min   GFR calc Af Amer >60 >60 mL/min   Anion gap 12 5 - 15    Comment: Performed at [facility], [address] [place], [place] [address]  Troponin I - ONCE - STAT     Status: None   Collection Time:  01/18/19 12:20 PM  Result Value Ref Range   Troponin I <0.03 <0.03 ng/mL    Comment: Performed at [facility], [address] [place], [place] [address]  SARS Coronavirus 2 (CEPHEID - Performed in [facility] hospital lab), Hosp Order     Status: None   Collection Time: 01/18/19 12:20 PM   Specimen: Nasopharyngeal Swab  Result Value Ref Range   SARS Coronavirus 2 NEGATIVE NEGATIVE    Comment: (NOTE) If result is NEGATIVE SARS-CoV-2 target nucleic acids are NOT DETECTED. The SARS-CoV-2 RNA is generally detectable in upper and lower  respiratory specimens during the acute phase of infection. The lowest  concentration of SARS-CoV-2 viral copies this assay can detect is 250  copies / mL. A negative result does not preclude SARS-CoV-2 infection  and should not be used as the sole basis for treatment or other  patient management decisions.  A negative result may occur with  improper specimen collection / handling, submission of specimen other  than nasopharyngeal swab, presence of viral mutation(s) within the  areas targeted by this assay, and inadequate number of viral copies  (<250 copies / mL). A negative result must be  combined with clinical  observations, patient history, and epidemiological information. If result is POSITIVE SARS-CoV-2 target nucleic acids are DETECTED. The SARS-CoV-2 RNA is generally detectable in upper and lower  respiratory specimens dur ing the acute phase of infection.  Positive  results are indicative of active infection with SARS-CoV-2.  Clinical  correlation with patient history and other diagnostic information is  necessary to determine patient infection status.  Positive results do  not rule out bacterial infection or co-infection with other viruses. If result is PRESUMPTIVE POSTIVE SARS-CoV-2 nucleic acids MAY BE PRESENT.   A presumptive positive result was obtained on the submitted specimen  and confirmed on repeat testing.  While 2019 novel  coronavirus  (SARS-CoV-2) nucleic acids may be present in the submitted sample  additional confirmatory testing may be necessary for epidemiological  and / or clinical management purposes  to differentiate between  SARS-CoV-2 and other Sarbecovirus currently known to infect humans.  If clinically indicated additional testing with an alternate test  methodology [phone]) is advised. The SARS-CoV-2 RNA is generally  detectable in upper and lower respiratory sp ecimens during the acute  phase of infection. The expected result is Negative. Fact Sheet for Patients:  StrictlyIdeas.no Fact Sheet for Healthcare Providers: BankingDealers.co.za This test is not yet approved or cleared by the [place] FDA and has been authorized for detection and/or diagnosis of SARS-CoV-2 by FDA under an Emergency Use Authorization (EUA).  This EUA will remain in effect (meaning this test can be used) for the duration of the COVID-19 declaration under Section 564(b)(1) of the Act, 21 U.S.C. section 360bbb-3(b)(1), unless the authorization is terminated or revoked sooner. Performed at [facility], [address] [place], [place] [address]   ABO/Rh     Status: None   Collection Time: 01/18/19 12:20 PM  Result Value Ref Range   ABO/RH(D)      O POS Performed at [facility], [address] [place], [place] [address]   Prepare RBC     Status: None   Collection Time: 01/18/19  2:34 PM  Result Value Ref Range   Order Confirmation      ORDER PROCESSED BY BLOOD BANK Performed at [facility], [address] [place], [place] [address]   Type and screen [place]     Status: None   Collection Time: 01/18/19  3:02 PM  Result Value Ref Range   ABO/RH(D) O POS    Antibody Screen NEG    Sample Expiration 01/21/2019,2359    Unit Number [id number]    Blood Component Type RED CELLS,LR    Unit division 00     Status of Unit ISSUED,FINAL    Transfusion Status OK TO TRANSFUSE    Crossmatch Result      Compatible Performed at [facility], [address] [place], [place] [address]   BPAM RBC     Status: None   Collection Time: 01/18/19  3:02 PM  Result Value Ref Range   ISSUE DATE / TIME [id number]    Blood Product Unit Number [id number]    PRODUCT CODE [id number]    Unit Type and Rh 5100    Blood Product Expiration Date 916384665993   HIV antibody (Routine Testing)     Status: None   Collection Time: 01/18/19  9:40 PM  Result Value Ref Range   HIV Screen 4th Generation wRfx Non Reactive Non Reactive    Comment: (NOTE) Performed At: [facility] [address]  [place], [place] [id number] Rush Farmer MD IW:[id number]   Iron and TIBC     Status: Abnormal   Collection Time: 01/18/19  9:40 PM  Result Value Ref Range   Iron 67 28 - 170 ug/dL   TIBC 556 (H) 250 - 450 ug/dL   Saturation Ratios 12 10.4 - 31.8 %   UIBC 489 ug/dL    Comment: Performed at [facility], [address] [place], [place] [address]  Vitamin B12     Status: None   Collection Time: 01/18/19  9:40 PM  Result Value Ref Range   Vitamin B-12 711 180 - 914 pg/mL    Comment: (NOTE) This assay is not validated for testing neonatal or myeloproliferative syndrome specimens for Vitamin B12 levels. Performed at [facility], [place] [address] [place], [place] [address]   Folate     Status: None   Collection Time: 01/18/19  9:40 PM  Result Value Ref Range   Folate 8.9 >5.9 ng/mL    Comment: Performed at [facility], [address] [place], [place] [address]  Hepatitis panel, acute     Status: Abnormal   Collection Time: 01/18/19  9:40 PM  Result Value Ref Range   Hepatitis B Surface Ag Negative Negative   HCV Ab >11.0 (H) 0.0 - 0.9 s/co ratio    Comment: (NOTE)                                  Negative:     < 0.8                             Indeterminate: 0.8 -  0.9                                  Positive:     > 0.9 The CDC recommends that a positive HCV antibody result be followed up with a HCV Nucleic Acid Amplification test (024097). Performed At: [facility] [place], [place] [id number] Rush Farmer MD AS:[id number]    Hep A IgM Negative Negative   Hep B C IgM Negative Negative  Hemoglobin and hematocrit, blood     Status: Abnormal   Collection Time: 01/18/19  9:40 PM  Result Value Ref Range   Hemoglobin 8.3 (L) 12.0 - 15.0 g/dL   HCT 26.4 (L) 36.0 - 46.0 %    Comment: Performed at [facility], [address] [place], [place] [address]  Troponin I - Once     Status: None   Collection Time: 01/18/19 10:50 PM  Result Value Ref Range   Troponin I <0.03 <0.03 ng/mL    Comment: Performed at [facility], [address] [place], [place] [address]  Basic metabolic panel     Status: Abnormal   Collection Time: 01/19/19  4:22 AM  Result Value Ref Range   Sodium 127 (L) 135 - 145 mmol/L   Potassium 3.8 3.5 - 5.1 mmol/L   Chloride 96 (L) 98 - 111 mmol/L   CO2 23 22 - 32 mmol/L   Glucose, Bld 99 70 - 99 mg/dL   BUN 11 6 - 20 mg/dL   Creatinine, Ser 0.60 0.44 - 1.00 mg/dL   Calcium 8.6 (L) 8.9 - 10.3 mg/dL   GFR calc non Af  Amer >60 >60 mL/min   GFR calc Af Amer >60 >60 mL/min   Anion gap 8 5 - 15    Comment: Performed at [facility], [address] [place], [place] [address]  CBC     Status: Abnormal   Collection Time: 01/19/19  4:22 AM  Result Value Ref Range   WBC 15.1 (H) 4.0 - 10.5 K/uL   RBC 3.39 (L) 3.87 - 5.11 MIL/uL   Hemoglobin 8.2 (L) 12.0 - 15.0 g/dL   HCT 26.8 (L) 36.0 - 46.0 %   MCV 79.1 (L) 80.0 - 100.0 fL   MCH 24.2 (L) 26.0 - 34.0 pg   MCHC 30.6 30.0 - 36.0 g/dL   RDW 17.5 (H) 11.5 - 15.5 %   Platelets 313 150 - 400 K/uL   nRBC 0.0 0.0 - 0.2 %    Comment: Performed at [facility], [address] [place], [place] [address]  Magnesium      Status: None   Collection Time: 01/19/19  4:22 AM  Result Value Ref Range   Magnesium 2.0 1.7 - 2.4 mg/dL    Comment: Performed at [facility], [address] [place], [place] [address]  Urinalysis, Complete w Microscopic     Status: Abnormal   Collection Time: 01/19/19 10:22 AM  Result Value Ref Range   Color, Urine AMBER (A) YELLOW    Comment: BIOCHEMICALS MAY BE AFFECTED BY COLOR   APPearance HAZY (A) CLEAR   Specific Gravity, Urine 1.018 1.005 - 1.030   pH 6.0 5.0 - 8.0   Glucose, UA NEGATIVE NEGATIVE mg/dL   Hgb urine dipstick NEGATIVE NEGATIVE   Bilirubin Urine NEGATIVE NEGATIVE   Ketones, ur NEGATIVE NEGATIVE mg/dL   Protein, ur NEGATIVE NEGATIVE mg/dL   Nitrite NEGATIVE NEGATIVE   Leukocytes,Ua NEGATIVE NEGATIVE   RBC / HPF 0-5 0 - 5 RBC/hpf   WBC, UA 0-5 0 - 5 WBC/hpf   Bacteria, UA NONE SEEN NONE SEEN   Squamous Epithelial / LPF 0-5 0 - 5    Comment: Performed at [facility], [address] [place], [place] [address]  ECHOCARDIOGRAM COMPLETE     Status: None   Collection Time: 01/19/19  2:01 PM  Result Value Ref Range   Weight 2,240 oz   Height 69 in   BP 100/71 mmHg  Lactate dehydrogenase (pleural or peritoneal fluid)     Status: Abnormal   Collection Time: 01/19/19  3:01 PM  Result Value Ref Range   LD, Fluid 42 (H) 3 - 23 U/L    Comment: (NOTE) Results should be evaluated in conjunction with serum values    Fluid Type-FLDH PERITONEAL     Comment: Performed at [facility], [address] [place], [place] [address] CORRECTED ON 05/22 AT 1519: PREVIOUSLY REPORTED AS CYTOPERI   Body fluid cell count with differential     Status: None   Collection Time: 01/19/19  3:01 PM  Result Value Ref Range   Fluid Type-FCT PERITONEAL     Comment: CORRECTED ON 05/22 AT 1519: PREVIOUSLY REPORTED AS CYTOPERI   Color, Fluid YELLOW YELLOW   Appearance, Fluid CLEAR CLEAR   WBC, Fluid 365 cu mm   Neutrophil Count, Fluid 25 %   Lymphs, Fluid  42 %   Monocyte-Macrophage-Serous Fluid 33 %   Eos, Fluid 0 %    Comment: Performed at [facility], [address] [place], [place] [address]  Body fluid culture  Status: None   Collection Time: 01/19/19  3:01 PM   Specimen: [facility] Cytology Peritoneal fluid  Result Value Ref Range   Specimen Description      PERITONEAL Performed at [facility], [address] [place], [place] [address]    Special Requests      NONE Performed at [facility], [place], [place] [address]    Gram Stain      RARE WBC PRESENT,BOTH PMN AND MONONUCLEAR NO ORGANISMS SEEN    Culture      NO GROWTH 3 DAYS Performed at [facility], [place] [address] [place], [place] [address]    Report Status 01/23/2019 FINAL   Albumin, pleural or peritoneal fluid     Status: None   Collection Time: 01/19/19  3:01 PM  Result Value Ref Range   Albumin, Fluid 1.0 g/dL    Comment: (NOTE) No normal range established for this test Results should be evaluated in conjunction with serum values    Fluid Type-FALB PERITONEAL     Comment: Performed at [facility], [address] [place], [place] [address] CORRECTED ON 05/22 AT 1937: PREVIOUSLY REPORTED AS CYTOPERI   Protein, pleural or peritoneal fluid     Status: None   Collection Time: 01/19/19  3:01 PM  Result Value Ref Range   Total protein, fluid <3.0 g/dL    Comment: (NOTE) No normal range established for this test Results should be evaluated in conjunction with serum values    Fluid Type-FTP PERITONEAL     Comment: Performed at [facility], [address] [place], [place] [address] CORRECTED ON 05/22 AT 9735: PREVIOUSLY REPORTED AS CYTOPERI   Glucose, pleural or peritoneal fluid     Status: None   Collection Time: 01/19/19  3:01 PM  Result Value Ref Range   Glucose, Fluid 109 mg/dL    Comment: (NOTE) No normal range established for this test Results should be evaluated in  conjunction with serum values    Fluid Type-FGLU PERITONEAL     Comment: Performed at [facility], [address] [place], [place] [address] CORRECTED ON 05/22 AT 1519: PREVIOUSLY REPORTED AS CYTOPERI   Lipase, Fluid     Status: None   Collection Time: 01/19/19  3:01 PM  Result Value Ref Range   Lipase-Fluid 7 U/L    Comment: (NOTE) INTERPRETIVE INFORMATION: Lipase, Fluid For information on body fluid reference ranges and/or interpretive guidance visit [email or web address].be Test developed and characteristics determined by [facility]. See Compliance Statement B: [email or web address] Performed At: [facility] [address] [place], [place] [id number] Esau Grew MD QI:[id number]    Source of Sample PERITONEAL     Comment: Performed at [facility], [address] [place], [place] [address]  Protein, body fluid (other)     Status: None   Collection Time: 01/19/19  3:01 PM  Result Value Ref Range   Total Protein, Body Fluid Other 1.7 g/dL    Comment: (NOTE)                            _________________________________________                           : BODY FLUID TYPE :     TOTAL PROTEIN     :                           :  _________________:_______________________:                           : Amniotic Fluid  :               <0.4    :                           :_________________:_______________________:                           :                 : Nonmalignant: <3.0    :                           : Ascitic Fluid   : Malignant:    >3.0    :                           :_________________:_______________________:                           : Bile, Clear     :               <0.9    :                           :_________________:_______________________:                           : Bile, Yellow    :          0.2 - 0.6    :                           :_________________:_______________________:                           : Lymph           :           2.2 - 6.0    :                           :_________________:_______________________:                            : Human Milk      :          1.9 - 2.0    :                           :_________________: ______________________:                           : Nasal Secretion :          0.1 - 3.5    :                           :_________________:_______________________:                           :  Pancreatic      :          0.0 - 0.1    :                           : Juice           :    (post stimulation) :                           :_________________:_______________________:                           :                 : Transudate:   <0.3    :                           : Pleural Fluid   : Exudate:      >0.3    :                           :_________________:_______________________:                           : Saliva          :          0.1 - 0.2    :                           : (Mixed Glands)  :                       :                           :_________________:_______________________:                           : Synovial Fluid  :               <2.5    :                            :_________________:_______________________:                           : Tears           :          0.8 - 0.9    :                           :_________________:_______________________:                            Louanne Skye, Karene Fry Reference Intervals                            for  Adults and Children 2008. Ninth  Edition (V9.1) [place],                            [place]; [place]: July 2009. The method performance specifications have not been established for this test in body fluid.  The test result should be integrated into the clinical context for interpretation. Performed At: [facility] [place], [place] [id number] Rush Farmer MD [id number]    Source of Sample PERITONEAL     Comment: Performed at [facility], [address] [place], [place] [address]  Pathologist smear review     Status: None   Collection Time: 01/19/19  3:01 PM  Result Value Ref Range   Path Review Cytospin slide of peritoneal fluid reviewed.     Comment: There is a mixed population of macrophages, lymphocytes, neutrophils, and a few reactive mesothelial cells. Absolute neutrophil count is 91. No malignant cells are seen. Reviewed by Lemmie Evens. Dicie Beam, MD. Performed at [facility], [address] [place], [place] [address]   Amylase, pleural or peritoneal fluid     Status: None   Collection Time: 01/19/19  3:01 PM  Result Value Ref Range   Amylase, Fluid 15 U/L    Comment: NO NORMAL RANGE ESTABLISHED FOR THIS TEST   Fluid Type-FAMY PLEURAL     Comment: Performed at [facility], [address] [place], [place] [address]  CBC     Status: Abnormal   Collection Time: 01/28/19  4:56 PM  Result Value Ref Range   WBC 25.8 (H) 4.0 - 10.5 K/uL   RBC 1.64 (L) 3.87 - 5.11 MIL/uL   Hemoglobin 4.0 (LL) 12.0 - 15.0 g/dL    Comment: This critical result has verified and been called to ANNA HOLT by [facility] on 05 31 2020 at 1711, and has been read back.    HCT 13.0 (LL) 36.0 - 46.0 %    Comment: This critical result has verified and been called to ANNA HOLT by [facility] on 05 31 2020 at 1711, and has been read back.    MCV 79.3 (L) 80.0 - 100.0 fL   MCH 24.4 (L) 26.0 - 34.0 pg   MCHC 30.8 30.0 - 36.0 g/dL   RDW 20.1 (H) 11.5 - 15.5 %   Platelets 288 150 - 400 K/uL   nRBC 0.1 0.0 - 0.2 %    Comment: Performed at [facility], [address] [place], [place] [address]  Basic metabolic panel     Status: Abnormal   Collection Time: 01/28/19  4:56 PM  Result Value Ref Range   Sodium 128 (L) 135 - 145 mmol/L   Potassium 4.3 3.5 - 5.1 mmol/L   Chloride 94 (L) 98 - 111 mmol/L   CO2 15 (L) 22 - 32 mmol/L   Glucose, Bld 114 (H) 70 - 99 mg/dL   BUN 94 (H) 6 - 20 mg/dL   Creatinine, Ser 1.62 (H)  0.44 - 1.00 mg/dL   Calcium 8.6 (L) 8.9 - 10.3 mg/dL   GFR calc non Af Amer 35 (L) >60 mL/min   GFR calc Af Amer 40 (L) >60 mL/min   Anion gap 19 (H) 5 - 15    Comment: Performed at [facility], [address] [place], [place] [address]  Troponin I - ONCE - STAT     Status: None   Collection Time: 01/28/19  4:56 PM  Result Value  Ref Range   Troponin I <0.03 <0.03 ng/mL    Comment: Performed at [facility], [address] [place], [place] [address]  Hepatic function panel     Status: Abnormal   Collection Time: 01/28/19  4:56 PM  Result Value Ref Range   Total Protein 5.6 (L) 6.5 - 8.1 g/dL   Albumin 3.3 (L) 3.5 - 5.0 g/dL   AST 69 (H) 15 - 41 U/L   ALT 30 0 - 44 U/L   Alkaline Phosphatase 196 (H) 38 - 126 U/L   Total Bilirubin 2.3 (H) 0.3 - 1.2 mg/dL   Bilirubin, Direct 1.2 (H) 0.0 - 0.2 mg/dL   Indirect Bilirubin 1.1 (H) 0.3 - 0.9 mg/dL    Comment: Performed at [facility], [address] [place], [place] [address]  Lipase, blood     Status: Abnormal   Collection Time: 01/28/19  4:56 PM  Result Value Ref Range   Lipase 71 (H) 11 - 51 U/L    Comment: Performed at [facility], [address] [place], [place] [address]  Prepare RBC     Status: None   Collection Time: 01/28/19  5:34 PM  Result Value Ref Range   Order Confirmation      ORDER PROCESSED BY BLOOD BANK Performed at [facility], [address] [place], [place] [address]   Type and screen Ordered by PROVIDER DEFAULT     Status: None   Collection Time: 01/28/19  5:34 PM  Result Value Ref Range   ABO/RH(D) O POS    Antibody Screen NEG    Sample Expiration 01/31/2019,2359    Unit Number [id number]    Blood Component Type RED CELLS,LR    Unit division 00    Status of Unit ISSUED,FINAL    Transfusion Status OK TO TRANSFUSE    Crossmatch Result COMPATIBLE    Unit Number [id number]    Blood Component Type RED CELLS,LR    Unit division 00    Status of  Unit ISSUED,FINAL    Transfusion Status OK TO TRANSFUSE    Crossmatch Result COMPATIBLE    Unit Number [id number]    Blood Component Type RED CELLS,LR    Unit division 00    Status of Unit ISSUED,FINAL    Transfusion Status OK TO TRANSFUSE    Crossmatch Result Compatible    Unit Number [id number]    Blood Component Type RED CELLS,LR    Unit division 00    Status of Unit ISSUED,FINAL    Transfusion Status OK TO TRANSFUSE    Crossmatch Result Compatible   BPAM RBC     Status: None   Collection Time: 01/28/19  5:34 PM  Result Value Ref Range   ISSUE DATE / TIME 854627035009    Blood Product Unit Number [id number]    Unit Type and Rh 5100    Blood Product Expiration Date 789381017510    ISSUE DATE / TIME 258527782423    Blood Product Unit Number [id number]    PRODUCT CODE [id number]    Unit Type and Rh 5100    Blood Product Expiration Date 326712458099    ISSUE DATE / TIME 833825053976    Blood Product Unit Number [id number]    PRODUCT CODE [id number]    Unit Type and Rh 5100    Blood Product Expiration Date 426834196222    ISSUE DATE / TIME 979892119417    Blood Product Unit Number [id number]    PRODUCT CODE [id number]  Unit Type and Rh 5100    Blood Product Expiration Date 732202542706   Prepare RBC     Status: None   Collection Time: 01/28/19  5:35 PM  Result Value Ref Range   Order Confirmation      ORDER PROCESSED BY BLOOD BANK Performed at [facility], [address] [place], [place] [address]   Ammonia     Status: None   Collection Time: 01/28/19  6:18 PM  Result Value Ref Range   Ammonia 10 9 - 35 umol/L    Comment: Performed at [facility], [address] [place], [place] [address]  SARS Coronavirus 2 (CEPHEID - Performed in [place] hospital lab), Hosp Order     Status: None   Collection Time: 01/28/19  6:18 PM   Specimen: Nasopharyngeal Swab  Result Value Ref Range   SARS Coronavirus 2 NEGATIVE NEGATIVE    Comment:  (NOTE) If result is NEGATIVE SARS-CoV-2 target nucleic acids are NOT DETECTED. The SARS-CoV-2 RNA is generally detectable in upper and lower  respiratory specimens during the acute phase of infection. The lowest  concentration of SARS-CoV-2 viral copies this assay can detect is 250  copies / mL. A negative result does not preclude SARS-CoV-2 infection  and should not be used as the sole basis for treatment or other  patient management decisions.  A negative result may occur with  improper specimen collection / handling, submission of specimen other  than nasopharyngeal swab, presence of viral mutation(s) within the  areas targeted by this assay, and inadequate number of viral copies  (<250 copies / mL). A negative result must be combined with clinical  observations, patient history, and epidemiological information. If result is POSITIVE SARS-CoV-2 target nucleic acids are DETECTED. The SARS-CoV-2 RNA is generally detectable in upper and lower  respiratory specimens dur ing the acute phase of infection.  Positive  results are indicative of active infection with SARS-CoV-2.  Clinical  correlation with patient history and other diagnostic information is  necessary to determine patient infection status.  Positive results do  not rule out bacterial infection or co-infection with other viruses. If result is PRESUMPTIVE POSTIVE SARS-CoV-2 nucleic acids MAY BE PRESENT.   A presumptive positive result was obtained on the submitted specimen  and confirmed on repeat testing.  While 2019 novel coronavirus  (SARS-CoV-2) nucleic acids may be present in the submitted sample  additional confirmatory testing may be necessary for epidemiological  and / or clinical management purposes  to differentiate between  SARS-CoV-2 and other Sarbecovirus currently known to infect humans.  If clinically indicated additional testing with an alternate test  methodology [phone]) is advised. The SARS-CoV-2 RNA is  generally  detectable in upper and lower respiratory sp ecimens during the acute  phase of infection. The expected result is Negative. Fact Sheet for Patients:  StrictlyIdeas.no Fact Sheet for Healthcare Providers: BankingDealers.co.za This test is not yet approved or cleared by the [place] FDA and has been authorized for detection and/or diagnosis of SARS-CoV-2 by FDA under an Emergency Use Authorization (EUA).  This EUA will remain in effect (meaning this test can be used) for the duration of the COVID-19 declaration under Section 564(b)(1) of the Act, 21 U.S.C. section 360bbb-3(b)(1), unless the authorization is terminated or revoked sooner. Performed at [facility], [address], [place], [place] [address]   Blood gas, arterial     Status: Abnormal   Collection Time: 01/28/19  7:05 PM  Result Value Ref  Range   FIO2 0.28    Delivery systems NASAL CANNULA    pH, Arterial 7.42 7.350 - 7.450   pCO2 arterial 23 (L) 32.0 - 48.0 mmHg   pO2, Arterial 107 83.0 - 108.0 mmHg   Bicarbonate 14.9 (L) 20.0 - 28.0 mmol/L   Acid-base deficit 7.6 (H) 0.0 - 2.0 mmol/L   O2 Saturation 98.2 %   Patient temperature 37.0    Collection site RIGHT RADIAL    Sample type ARTERIAL DRAW    Allens test (pass/fail) PASS PASS    Comment: Performed at [facility], [address] [place], [place] [address]  Basic metabolic panel     Status: Abnormal   Collection Time: 01/29/19  1:57 AM  Result Value Ref Range   Sodium 130 (L) 135 - 145 mmol/L   Potassium 4.1 3.5 - 5.1 mmol/L   Chloride 98 98 - 111 mmol/L   CO2 19 (L) 22 - 32 mmol/L   Glucose, Bld 112 (H) 70 - 99 mg/dL   BUN 98 (H) 6 - 20 mg/dL    Comment: RESULT CONFIRMED BY MANUAL DILUTION/RWW   Creatinine, Ser 1.72 (H) 0.44 - 1.00 mg/dL   Calcium 7.8 (L) 8.9 - 10.3 mg/dL   GFR calc non Af Amer 32 (L) >60 mL/min   GFR calc Af Amer 38 (L) >60 mL/min   Anion gap 13 5 - 15     Comment: Performed at [facility], [address] [place], [place] [address]  CBC     Status: Abnormal   Collection Time: 01/29/19  1:57 AM  Result Value Ref Range   WBC 21.9 (H) 4.0 - 10.5 K/uL   RBC 2.06 (L) 3.87 - 5.11 MIL/uL   Hemoglobin 5.6 (L) 12.0 - 15.0 g/dL    Comment: POST TRANSFUSION SPECIMEN   HCT 16.6 (L) 36.0 - 46.0 %   MCV 80.6 80.0 - 100.0 fL   MCH 27.2 26.0 - 34.0 pg   MCHC 33.7 30.0 - 36.0 g/dL   RDW 17.9 (H) 11.5 - 15.5 %   Platelets 199 150 - 400 K/uL   nRBC 0.2 0.0 - 0.2 %    Comment: Performed at [facility], [address] [place], [place] [address]  Magnesium     Status: None   Collection Time: 01/29/19  1:57 AM  Result Value Ref Range   Magnesium 2.1 1.7 - 2.4 mg/dL    Comment: Performed at [facility], [address] [place], [place] [address]  Prepare RBC     Status: None   Collection Time: 01/29/19  4:59 AM  Result Value Ref Range   Order Confirmation      ORDER PROCESSED BY BLOOD BANK Performed at [facility], [address] [place], [place] [address]   Urinalysis, Complete w Microscopic     Status: Abnormal   Collection Time: 01/29/19  5:38 AM  Result Value Ref Range   Color, Urine YELLOW (A) YELLOW   APPearance HAZY (A) CLEAR   Specific Gravity, Urine 1.011 1.005 - 1.030   pH 5.0 5.0 - 8.0   Glucose, UA NEGATIVE NEGATIVE mg/dL   Hgb urine dipstick NEGATIVE NEGATIVE   Bilirubin Urine NEGATIVE NEGATIVE   Ketones, ur NEGATIVE NEGATIVE mg/dL   Protein, ur NEGATIVE NEGATIVE mg/dL   Nitrite NEGATIVE NEGATIVE   Leukocytes,Ua MODERATE (A) NEGATIVE   RBC / HPF 0-5 0 - 5 RBC/hpf   WBC, UA 0-5 0 - 5 WBC/hpf  Bacteria, UA NONE SEEN NONE SEEN   Squamous Epithelial / LPF 0-5 0 - 5    Comment: Performed at [facility], [address] [place], [place] [address]  Hemoglobin and hematocrit, blood     Status: Abnormal   Collection Time: 01/29/19 11:28 AM  Result Value Ref Range   Hemoglobin  6.9 (L) 12.0 - 15.0 g/dL   HCT 21.4 (L) 36.0 - 46.0 %    Comment: Performed at [facility], [address], [place], [place] [address]  Prepare RBC     Status: None   Collection Time: 01/29/19 11:59 AM  Result Value Ref Range   Order Confirmation      ORDER PROCESSED BY BLOOD BANK Performed at [facility], [address] [place], [place] [address]   Hemoglobin and hematocrit, blood     Status: Abnormal   Collection Time: 01/29/19  6:47 PM  Result Value Ref Range   Hemoglobin 8.7 (L) 12.0 - 15.0 g/dL    Comment: REPEATED TO VERIFY POST TRANSFUSION SPECIMEN    HCT 26.6 (L) 36.0 - 46.0 %    Comment: Performed at [facility], [place], [place] [address]  HCV RNA BY PCR, QN RFX GENO     Status: None   Collection Time: 01/30/19  3:43 AM  Result Value Ref Range   HepC Qn HCV Not Detected IU/mL   HCV RNA Qnt(log copy/mL) UNABLE TO CALCULATE log10 IU/mL    Comment: (NOTE) Unable to calculate result since non-numeric result obtained for component test.    Test Information Comment     Comment: (NOTE) The quantitative range of this assay is 15 IU/mL to 100 million IU/mL. Performed At: [facility] [place], [place] [id number] Rush Farmer MD [id number]    Hcv Genotype RTNI     Comment: Not indicated  Hemoglobin and hematocrit, blood     Status: Abnormal   Collection Time: 01/30/19  5:33 PM  Result Value Ref Range   Hemoglobin 9.8 (L) 12.0 - 15.0 g/dL   HCT 30.1 (L) 36.0 - 46.0 %    Comment: Performed at [facility], [address] [place], [place] [address]  CBC     Status: Abnormal   Collection Time: 01/31/19  2:18 AM  Result Value Ref Range   WBC 19.0 (H) 4.0 - 10.5 K/uL   RBC 3.06 (L) 3.87 - 5.11 MIL/uL   Hemoglobin 8.6 (L) 12.0 - 15.0 g/dL   HCT 26.1 (L) 36.0 - 46.0 %   MCV 85.3 80.0 - 100.0 fL   MCH 28.1 26.0 - 34.0 pg   MCHC 33.0 30.0 - 36.0 g/dL   RDW 18.3 (H) 11.5 - 15.5 %    Platelets 181 150 - 400 K/uL   nRBC 0.1 0.0 - 0.2 %    Comment: Performed at [facility], [address] [place],  [address]  Basic metabolic panel     Status: Abnormal   Collection Time: 01/31/19  2:18 AM  Result Value Ref Range   Sodium 137 135 - 145 mmol/L   Potassium 2.8 (L) 3.5 - 5.1 mmol/L   Chloride 108 98 - 111 mmol/L   CO2 22 22 - 32 mmol/L   Glucose, Bld 112 (H) 70 - 99 mg/dL   BUN 39 (H) 6 - 20 mg/dL   Creatinine, Ser 0.70 0.44 - 1.00 mg/dL   Calcium 8.2 (L) 8.9 - 10.3 mg/dL   GFR calc  non Af Amer >60 >60 mL/min   GFR calc Af Amer >60 >60 mL/min   Anion gap 7 5 - 15    Comment: Performed at [facility], [address] [place], [place] [address]  Protime-INR     Status: None   Collection Time: 02/09/19 10:54 AM  Result Value Ref Range   Prothrombin Time 14.6 11.4 - 15.2 seconds   INR 1.2 0.8 - 1.2    Comment: (NOTE) INR goal varies based on device and disease states. Performed at [facility], [address] [place], [place] [address]   AFP tumor marker     Status: None   Collection Time: 02/09/19 10:54 AM  Result Value Ref Range   AFP, Serum, Tumor Marker 7.6 0.0 - 8.3 ng/mL    Comment: (NOTE) Roche [employer] Electrochemiluminescence Immunoassay (ECLIA) Values obtained with different assay methods or kits cannot be used interchangeably.  Results cannot be interpreted as absolute evidence of the presence or absence of malignant disease. This test is not interpretable in pregnant females. Performed At: [facility] [place], [place] [id number] Rush Farmer MD KT:[id number]   Ferritin     Status: None   Collection Time: 02/09/19 10:54 AM  Result Value Ref Range   Ferritin 22 11 - 307 ng/mL    Comment: Performed at [facility], [address] [place], [place] [address]  Iron and TIBC     Status: Abnormal   Collection Time: 02/09/19 10:54 AM  Result Value Ref Range   Iron 30 28 -  170 ug/dL   TIBC 496 (H) 250 - 450 ug/dL   Saturation Ratios 6 (L) 10.4 - 31.8 %   UIBC 466 ug/dL    Comment: Performed at [facility], [address] [place], [place] [address]  Comprehensive metabolic panel     Status: Abnormal   Collection Time: 02/09/19 10:54 AM  Result Value Ref Range   Sodium 134 (L) 135 - 145 mmol/L   Potassium 4.0 3.5 - 5.1 mmol/L   Chloride 99 98 - 111 mmol/L   CO2 23 22 - 32 mmol/L   Glucose, Bld 148 (H) 70 - 99 mg/dL   BUN 20 6 - 20 mg/dL   Creatinine, Ser 0.78 0.44 - 1.00 mg/dL   Calcium 9.1 8.9 - 10.3 mg/dL   Total Protein 6.7 6.5 - 8.1 g/dL   Albumin 3.7 3.5 - 5.0 g/dL   AST 74 (H) 15 - 41 U/L   ALT 42 0 - 44 U/L   Alkaline Phosphatase 401 (H) 38 - 126 U/L   Total Bilirubin 5.5 (H) 0.3 - 1.2 mg/dL   GFR calc non Af Amer >60 >60 mL/min   GFR calc Af Amer >60 >60 mL/min   Anion gap 12 5 - 15    Comment: Performed at [facility], [address] [place], [place] [address]  CBC     Status: Abnormal   Collection Time: 02/09/19 10:54 AM  Result Value Ref Range   WBC 12.2 (H) 4.0 - 10.5 K/uL   RBC 4.01 3.87 - 5.11 MIL/uL   Hemoglobin 10.9 (L) 12.0 - 15.0 g/dL   HCT 34.5 (L) 36.0 - 46.0 %   MCV 86.0 80.0 - 100.0 fL   MCH 27.2 26.0 - 34.0 pg   MCHC 31.6 30.0 - 36.0 g/dL   RDW 19.0 (H) 11.5 - 15.5 %   Platelets 319 150 - 400 K/uL   nRBC 0.0 0.0 -  0.2 %    Comment: Performed at [facility], [place], [place] [address]    PHQ2/9: Depression screen [facility] 2/9 04/02/2019 03/07/2019 02/26/2019  Decreased Interest 0 0 0  Down, Depressed, Hopeless 0 0 0  PHQ - 2 Score 0 0 0  Altered sleeping 0 - 0  Tired, decreased energy 0 - 0  Change in appetite 0 - 0  Feeling bad or failure about yourself  0 - 0  Trouble concentrating 0 - 0  Moving slowly or fidgety/restless 0 - 0  Suicidal thoughts 0 - 0  PHQ-9 Score 0 - 0  Difficult doing work/chores Not difficult at all - Not difficult at all     Fall Risk: Fall  Risk  04/02/2019 02/26/2019  Falls in the past year? 0 0  Number falls in past yr: 0 0  Injury with Fall? 0 0  Follow up Falls evaluation completed -   Assessment & Plan   1. Cervical cancer screening - Cytology - PAP -USPSTF grade A and B recommendations reviewed with patient; age-appropriate recommendations, preventive care, screening tests, etc discussed and encouraged; healthy living encouraged; see AVS for patient education given to patient -Discussed importance of 150 minutes of physical activity weekly, eat two servings of fish weekly, eat one serving of tree nuts ( cashews, pistachios, pecans, almonds.[name]) every other day, eat 6 servings of fruit/vegetables daily and drink plenty of water and avoid sweet beverages.  - Cytology - PAP - Did recommend surgical consult for abdominal abnormality; she declines today but will consider in the future.  2. Typical atrial flutter (HCC) - Taking nadolol doing well, HR controlled today  3. Alcoholic cirrhosis of liver with ascites (HCC) - Seeing GI, would like to switch providers, advised she needs to speak with her GI specialist regarding this, but that we are here if she needs new referral.  4. Ascites due to alcoholic cirrhosis ([place]) - Abdomen is soft today.  5. Hypokalemia - Labs per Dr. Marius Ditch.  6. Lipid screening - Lipid panel  7. Muscle spasm - tiZANidine (ZANAFLEX) 4 MG tablet; Take 0.5-1 tablets (2-4 mg total) by mouth every 8 (eight) hours as needed for muscle spasms.  Dispense: 270 tablet; Refill: 0

## 2019-04-02 NOTE — Patient Instructions (Addendum)
Ask about Dr. Bonna Gains and Dr. Vicente Males  Please also call to schedule appointment with Dr. Rockey Situ (Cardiologist/Heart Doctor)

## 2019-04-03 LAB — CBC WITH DIFFERENTIAL/PLATELET
Absolute Monocytes: 1242 cells/uL — ABNORMAL HIGH (ref 200–950)
Basophils Absolute: 61 cells/uL (ref 0–200)
Basophils Relative: 0.6 %
Eosinophils Absolute: 192 cells/uL (ref 15–500)
Eosinophils Relative: 1.9 %
HCT: 32.6 % — ABNORMAL LOW (ref 35.0–45.0)
Hemoglobin: 10.1 g/dL — ABNORMAL LOW (ref 11.7–15.5)
Lymphs Abs: 2343 cells/uL (ref 850–3900)
MCH: 23.3 pg — ABNORMAL LOW (ref 27.0–33.0)
MCHC: 31 g/dL — ABNORMAL LOW (ref 32.0–36.0)
MCV: 75.1 fL — ABNORMAL LOW (ref 80.0–100.0)
MPV: 10.9 fL (ref 7.5–12.5)
Monocytes Relative: 12.3 %
Neutro Abs: 6262 cells/uL (ref 1500–7800)
Neutrophils Relative %: 62 %
Platelets: 261 10*3/uL (ref 140–400)
RBC: 4.34 10*6/uL (ref 3.80–5.10)
RDW: 15.8 % — ABNORMAL HIGH (ref 11.0–15.0)
Total Lymphocyte: 23.2 %
WBC: 10.1 10*3/uL (ref 3.8–10.8)

## 2019-04-03 LAB — COMPREHENSIVE METABOLIC PANEL
AG Ratio: 1.9 (calc) (ref 1.0–2.5)
ALT: 23 U/L (ref 6–29)
AST: 49 U/L — ABNORMAL HIGH (ref 10–35)
Albumin: 4.3 g/dL (ref 3.6–5.1)
Alkaline phosphatase (APISO): 310 U/L — ABNORMAL HIGH (ref 37–153)
BUN: 8 mg/dL (ref 7–25)
CO2: 23 mmol/L (ref 20–32)
Calcium: 10.1 mg/dL (ref 8.6–10.4)
Chloride: 99 mmol/L (ref 98–110)
Creat: 0.53 mg/dL (ref 0.50–1.05)
Globulin: 2.3 g/dL (calc) (ref 1.9–3.7)
Glucose, Bld: 90 mg/dL (ref 65–99)
Potassium: 4.6 mmol/L (ref 3.5–5.3)
Sodium: 132 mmol/L — ABNORMAL LOW (ref 135–146)
Total Bilirubin: 2.5 mg/dL — ABNORMAL HIGH (ref 0.2–1.2)
Total Protein: 6.6 g/dL (ref 6.1–8.1)

## 2019-04-04 LAB — CYTOLOGY - PAP
Adequacy: ABSENT
Diagnosis: NEGATIVE
HPV: NOT DETECTED

## 2019-04-05 NOTE — Addendum Note (Signed)
Addended by: Hubbard Hartshorn on: 04/05/2019 08:19 AM   Modules accepted: Level of Service

## 2019-04-23 ENCOUNTER — Encounter: Payer: Self-pay | Admitting: Gastroenterology

## 2019-04-23 ENCOUNTER — Ambulatory Visit (INDEPENDENT_AMBULATORY_CARE_PROVIDER_SITE_OTHER): Payer: Medicare Other | Admitting: Gastroenterology

## 2019-04-23 ENCOUNTER — Other Ambulatory Visit: Payer: Self-pay

## 2019-04-23 DIAGNOSIS — I4819 Other persistent atrial fibrillation: Secondary | ICD-10-CM | POA: Diagnosis not present

## 2019-04-23 DIAGNOSIS — I483 Typical atrial flutter: Secondary | ICD-10-CM

## 2019-04-23 MED ORDER — NADOLOL 20 MG PO TABS: 20.0000 mg | ORAL_TABLET | Freq: Every day | ORAL | 1 refills | Status: DC

## 2019-04-23 NOTE — Progress Notes (Addendum)
Cephas Darby, MD [address]  [place]  [place], [place] [address]  Main: [phone]  Fax: [phone]    Gastroenterology Consultation  Referring Provider:     Hubbard Hartshorn, FNP Primary Care Physician:  Hubbard Hartshorn, FNP Primary Gastroenterologist:  Dr. Cephas Darby Reason for Consultation:     Decompensated alcoholic cirrhosis        HPI:   Anita Reed is a 58 y.o. female is here for hospital follow-up and management of decompensated alcoholic cirrhosis.  Patient is recently diagnosed with cirrhosis of liver in 12/2018 when she presented to [facility] with abdominal distention secondary to ascites which is consistent with portal hypertension based on fluid analysis, SSAG > 1.1.  She was discharged home on diuretics which she was not taking.  She then readmitted to [facility] secondary to active upper GI bleed about 2 weeks ago, EGD revealed Dieulafoy's lesion that was clipped.  She also had ascites at that time and underwent therapeutic paracentesis.  She was discharged home on furosemide 40 mg twice daily and spironolactone 150 mg daily, nadolol.  Her hemoglobin on discharge was 8.6.  Patient had chronic hep C which was treated in the past.  Viral load undetectable  Interval summary Patient reports that she did not catch even a drop of alcohol since last hospital admission.  She said in front of her husband that they removed all alcohol from her house.  She has been taking furosemide 40 mg twice daily but not spironolactone.  She is on Protonix 40 mg twice daily, also taking nadolol 20 mg daily.  She denies swelling in her legs but feels very gassy, abdomen distended.  She reports having irregular bowel habits chronically.  She reports her stools are orange-colored but not black or red.  She is trying to eat more protein.  Patient was seen on a tele-visit by Dr. Rockey Situ 2 days ago for a flutter which was thought to be secondary to hypokalemia.  Anticoagulation was deferred due to  recent GI bleed  Follow-up visit 03/26/2019 Patient has fever of 100.4, she does report diffuse abdominal pain, she thinks she is gassy and constipated from taking prenatal vitamins.  She is taking MiraLAX as needed and cut back on prenatal vitamins to every 3 days.  She denies subjective fevers but she had temp of 100.4 in our office today.  She said she was in the grocery store about a week ago, wearing mask.  She was tested negative for COVID twice in last 3 months.  She denies dark urine, yellowing of skin, black stools, hematemesis.  She reports not drinking alcohol but continues to smoke cigarettes  Follow-up visit 04/23/2019 Patient reports having a glass of wine for July 4 weekend.  Otherwise, she says she does not drink alcohol.  She says she feels good.  She is only concerned about severe constipation and abdominal distention with bloating, a lot of gas.  She is taking laxative as needed to have a bowel movement which is every 4 days associated with significant straining.  She denies swelling of legs, black stools, rectal bleeding, nausea, hematemesis, weight loss.  She continues to smoke.  She is taking diuretics as needed only.  She is taking Protonix, nadolol  NSAIDs: None  Antiplts/Anticoagulants/Anti thrombotics: None  GI Procedures: EGD 01/29/2019 - Normal duodenal bulb and second portion of the duodenum. - Severe portal hypertensive gastropathy in gastric fundus. - Large (> 5 mm) varices were found in the lower  third of the esophagus, with no red wale signs or stigmata of recent bleed. - 2 cm hiatal hernia. - Dieulafoy lesion of stomach. - No specimens collected.  Patient reports having had several colonoscopies in the past  Past Medical History:  Diagnosis Date  . Anemia   . Cancer ([place])   . Cirrhosis ([place])   . Hypertension     Past Surgical History:  Procedure Laterality Date  . ABDOMINAL HYSTERECTOMY    . APPENDECTOMY    . ESOPHAGOGASTRODUODENOSCOPY N/A 01/29/2019    Procedure: ESOPHAGOGASTRODUODENOSCOPY (EGD);  Surgeon: [name], MD;  Location: [facility] ENDOSCOPY;  Service: Gastroenterology;  Laterality: N/A;  . TONSILLECTOMY      Current Outpatient Medications:  .  furosemide (LASIX) 40 MG tablet, Take 1 tablet (40 mg total) by mouth 2 (two) times daily., Disp: 60 tablet, Rfl: 3 .  nadolol (CORGARD) 20 MG tablet, Take 1 tablet (20 mg total) by mouth daily., Disp: 90 tablet, Rfl: 1 .  pantoprazole (PROTONIX) 40 MG tablet, Take 1 tablet (40 mg total) by mouth 2 (two) times daily., Disp: 60 tablet, Rfl: 11 .  Potassium Chloride ER 20 MEQ TBCR, Take one tablet twice daily (Patient not taking: Reported on 04/02/2019), Disp: 60 tablet, Rfl: 1 .  spironolactone (ALDACTONE) 50 MG tablet, Take 2 tablets (100 mg total) by mouth daily., Disp: 180 tablet, Rfl: 1 .  Thiamine HCl (VITAMIN B-1 PO), Take by mouth., Disp: , Rfl:  .  tiZANidine (ZANAFLEX) 4 MG tablet, Take 0.5-1 tablets (2-4 mg total) by mouth every 8 (eight) hours as needed for muscle spasms. (Patient not taking: Reported on 04/23/2019), Disp: 270 tablet, Rfl: 0  Family History  Problem Relation Age of Onset  . Cancer Mother   . Cancer Father      Social History   Tobacco Use  . Smoking status: Current Every Day Smoker  . Smokeless tobacco: Never Used  Substance Use Topics  . Alcohol use: Not Currently  . Drug use: Never    Allergies as of 04/23/2019  . (No Known Allergies)    Review of Systems:    All systems reviewed and negative except where noted in HPI.   Physical Exam:  Temp 98.7 F (37.1 C) (Oral)  No LMP recorded. Patient has had a hysterectomy.  General:   Alert, thin built, poorly nourished, pleasant and cooperative in NAD Head:  Normocephalic and atraumatic, bitemporal wasting. Eyes:  Sclera positive icterus.   Muddy conjunctiva Ears:  Normal auditory acuity. Nose:  No deformity, discharge, or lesions. Mouth:  No deformity or lesions,oropharynx pink & moist. Neck:   Supple; no masses or thyromegaly. Lungs:  Respirations even and unlabored.  Clear throughout to auscultation.   No wheezes, crackles, or rhonchi. No acute distress. Heart:  Regular rate and rhythm; no murmurs, clicks, rubs, or gallops. Abdomen:  Normal bowel sounds. Soft, non-tender and diffusely distended, nontender, without masses, hepatosplenomegaly or hernias noted.  No guarding or rebound tenderness.   Rectal: Not performed Msk:  Symmetrical without gross deformities. Good, equal movement & strength bilaterally. Pulses:  Normal pulses noted. Extremities:  No clubbing or edema.  No cyanosis. Neurologic:  Alert and oriented x3;  grossly normal neurologically. Skin:  Intact without significant lesions or rashes. No jaundice. Psych:  Alert and cooperative. Normal mood and affect.  Imaging Studies: Reviewed  Assessment and Plan:   Anita Reed is a 58 y.o. female with history of chronic hep C, status post treatment, SVR, decompensated alcoholic cirrhosis  with ascites, esophageal varices, upper GI bleed secondary to Dieulafoy's is here for follow-up  Decompensated cirrhosis of liver, secondary to alcohol use Hepatitis A and B serologies negative Chronic hep C, cured, status post treatment Portal hypertension manifested as esophageal varices, ascites Ascites: Well-controlled, currently euvolemic, she is taking furosemide to 40 mg daily, spironolactone 100 mg as needed only, continue 2 g sodium diet Esophageal varices: Nonbleeding, continue nadolol 20 mg daily, refilled Anemia: Mild iron deficiency anemia Continue prenatal multivitamin HCC screening: serum AFP levels normal, CT abdomen and pelvis in 12/2018 did not detect any liver lesions HRS: None Hepatic encephalopathy: Not present Reiterated to her about complete abstinence from alcohol Administered first dose of Twinrix and Prevnar on 02/09/2019 Will administer first booster of Twinrix when available, vaccine is out of stock in our  office  Colon cancer screening, overdue for colonoscopy Discussed with her about colonoscopy.  Patient said she could not tolerate prep in the past and she does not want colonoscopy now.  She said she is overwhelmed with paying her medical bills.  I told her that colonoscopy is preventive care and is fully covered by her insurance.  Despite this, she said she would like to wait for 6 4 months at least.  Follow-up with PCP  Chronic constipation Discussed with her at about high-fiber diet, fiber supplements, Information provided Trial of Linzess 145 MCG daily  Follow up in 3 months   Cephas Darby, MD

## 2019-04-23 NOTE — Addendum Note (Signed)
Addended by: Cephas Darby on: 04/23/2019 04:12 PM   Modules accepted: Level of Service

## 2019-04-24 ENCOUNTER — Telehealth: Payer: Self-pay | Admitting: Gastroenterology

## 2019-04-24 NOTE — Telephone Encounter (Signed)
Patient called in stating she was here yesterday & Dr Marius Ditch gave her samples of Linzess and even using [facility] is $447.00. She has no prescription insurance/self pay. Do you want her to begin the [place] or try something different more affordable? She will replay for prescription insurance in 2 months if you want to keep her on Linzess could she still get samples to carry her through.

## 2019-04-24 NOTE — Telephone Encounter (Signed)
Since she already has the samples, she can go ahead and try those.  So that, at least we know if this is working.  If Linzess is not working, we can try Amitiza 24 MCG twice daily.    [employer]

## 2019-05-02 NOTE — Telephone Encounter (Signed)
Pt will pick up samples of linzess 145 mcg in office tomorrow 05/03/2019

## 2019-05-03 ENCOUNTER — Other Ambulatory Visit: Payer: Self-pay

## 2019-05-03 ENCOUNTER — Ambulatory Visit (INDEPENDENT_AMBULATORY_CARE_PROVIDER_SITE_OTHER): Payer: Medicare Other | Admitting: Gastroenterology

## 2019-05-03 DIAGNOSIS — Z23 Encounter for immunization: Secondary | ICD-10-CM

## 2019-05-10 ENCOUNTER — Ambulatory Visit: Payer: Medicare Other | Admitting: Family Medicine

## 2019-06-01 ENCOUNTER — Inpatient Hospital Stay: Payer: Medicare Other | Admitting: Anesthesiology

## 2019-06-01 ENCOUNTER — Encounter
Admission: EM | Disposition: A | Discharge status: Home / Self Care | Payer: Self-pay | Source: Home / Self Care | Attending: Internal Medicine

## 2019-06-01 ENCOUNTER — Other Ambulatory Visit: Payer: Self-pay

## 2019-06-01 ENCOUNTER — Telehealth: Payer: Self-pay

## 2019-06-01 ENCOUNTER — Inpatient Hospital Stay
Admission: EM | Admit: 2019-06-01 | Discharge: 2019-06-11 | DRG: 432 | Disposition: A | Discharge status: Home / Self Care | Payer: Medicare Other | Attending: Internal Medicine | Admitting: Internal Medicine

## 2019-06-01 DIAGNOSIS — F1011 Alcohol abuse, in remission: Secondary | ICD-10-CM | POA: Diagnosis not present

## 2019-06-01 DIAGNOSIS — I8500 Esophageal varices without bleeding: Secondary | ICD-10-CM | POA: Diagnosis present

## 2019-06-01 DIAGNOSIS — K640 First degree hemorrhoids: Secondary | ICD-10-CM | POA: Diagnosis not present

## 2019-06-01 DIAGNOSIS — D62 Acute posthemorrhagic anemia: Secondary | ICD-10-CM | POA: Diagnosis present

## 2019-06-01 DIAGNOSIS — A0472 Enterocolitis due to Clostridium difficile, not specified as recurrent: Secondary | ICD-10-CM | POA: Diagnosis present

## 2019-06-01 DIAGNOSIS — Z20828 Contact with and (suspected) exposure to other viral communicable diseases: Secondary | ICD-10-CM | POA: Diagnosis present

## 2019-06-01 DIAGNOSIS — K2970 Gastritis, unspecified, without bleeding: Secondary | ICD-10-CM | POA: Diagnosis not present

## 2019-06-01 DIAGNOSIS — F1020 Alcohol dependence, uncomplicated: Secondary | ICD-10-CM | POA: Diagnosis present

## 2019-06-01 DIAGNOSIS — Z03818 Encounter for observation for suspected exposure to other biological agents ruled out: Secondary | ICD-10-CM | POA: Diagnosis not present

## 2019-06-01 DIAGNOSIS — Z978 Presence of other specified devices: Secondary | ICD-10-CM | POA: Diagnosis not present

## 2019-06-01 DIAGNOSIS — F17200 Nicotine dependence, unspecified, uncomplicated: Secondary | ICD-10-CM | POA: Diagnosis present

## 2019-06-01 DIAGNOSIS — R000 Tachycardia, unspecified: Secondary | ICD-10-CM | POA: Diagnosis present

## 2019-06-01 DIAGNOSIS — K7031 Alcoholic cirrhosis of liver with ascites: Secondary | ICD-10-CM | POA: Diagnosis not present

## 2019-06-01 DIAGNOSIS — K920 Hematemesis: Secondary | ICD-10-CM | POA: Diagnosis not present

## 2019-06-01 DIAGNOSIS — I959 Hypotension, unspecified: Secondary | ICD-10-CM | POA: Diagnosis present

## 2019-06-01 DIAGNOSIS — K3182 Dieulafoy lesion (hemorrhagic) of stomach and duodenum: Secondary | ICD-10-CM | POA: Diagnosis present

## 2019-06-01 DIAGNOSIS — K449 Diaphragmatic hernia without obstruction or gangrene: Secondary | ICD-10-CM | POA: Diagnosis not present

## 2019-06-01 DIAGNOSIS — K6389 Other specified diseases of intestine: Secondary | ICD-10-CM | POA: Diagnosis present

## 2019-06-01 DIAGNOSIS — K6289 Other specified diseases of anus and rectum: Secondary | ICD-10-CM | POA: Diagnosis present

## 2019-06-01 DIAGNOSIS — K7290 Hepatic failure, unspecified without coma: Secondary | ICD-10-CM | POA: Diagnosis not present

## 2019-06-01 DIAGNOSIS — D500 Iron deficiency anemia secondary to blood loss (chronic): Secondary | ICD-10-CM | POA: Diagnosis not present

## 2019-06-01 DIAGNOSIS — K921 Melena: Secondary | ICD-10-CM | POA: Diagnosis not present

## 2019-06-01 DIAGNOSIS — R601 Generalized edema: Secondary | ICD-10-CM | POA: Diagnosis present

## 2019-06-01 DIAGNOSIS — R109 Unspecified abdominal pain: Secondary | ICD-10-CM

## 2019-06-01 DIAGNOSIS — I8511 Secondary esophageal varices with bleeding: Secondary | ICD-10-CM | POA: Diagnosis not present

## 2019-06-01 DIAGNOSIS — K7030 Alcoholic cirrhosis of liver without ascites: Secondary | ICD-10-CM | POA: Diagnosis not present

## 2019-06-01 DIAGNOSIS — K922 Gastrointestinal hemorrhage, unspecified: Secondary | ICD-10-CM | POA: Diagnosis present

## 2019-06-01 DIAGNOSIS — Z809 Family history of malignant neoplasm, unspecified: Secondary | ICD-10-CM

## 2019-06-01 DIAGNOSIS — T39395A Adverse effect of other nonsteroidal anti-inflammatory drugs [NSAID], initial encounter: Secondary | ICD-10-CM | POA: Diagnosis present

## 2019-06-01 DIAGNOSIS — K5900 Constipation, unspecified: Secondary | ICD-10-CM | POA: Diagnosis not present

## 2019-06-01 DIAGNOSIS — K529 Noninfective gastroenteritis and colitis, unspecified: Secondary | ICD-10-CM | POA: Diagnosis not present

## 2019-06-01 DIAGNOSIS — D72829 Elevated white blood cell count, unspecified: Secondary | ICD-10-CM | POA: Diagnosis not present

## 2019-06-01 DIAGNOSIS — D649 Anemia, unspecified: Secondary | ICD-10-CM | POA: Diagnosis not present

## 2019-06-01 DIAGNOSIS — R188 Other ascites: Secondary | ICD-10-CM | POA: Diagnosis not present

## 2019-06-01 DIAGNOSIS — I10 Essential (primary) hypertension: Secondary | ICD-10-CM | POA: Diagnosis not present

## 2019-06-01 DIAGNOSIS — K766 Portal hypertension: Secondary | ICD-10-CM | POA: Diagnosis not present

## 2019-06-01 DIAGNOSIS — K5289 Other specified noninfective gastroenteritis and colitis: Secondary | ICD-10-CM | POA: Diagnosis not present

## 2019-06-01 DIAGNOSIS — Z8719 Personal history of other diseases of the digestive system: Secondary | ICD-10-CM | POA: Diagnosis not present

## 2019-06-01 DIAGNOSIS — Z791 Long term (current) use of non-steroidal anti-inflammatories (NSAID): Secondary | ICD-10-CM | POA: Diagnosis not present

## 2019-06-01 DIAGNOSIS — G8929 Other chronic pain: Secondary | ICD-10-CM | POA: Diagnosis present

## 2019-06-01 DIAGNOSIS — I8510 Secondary esophageal varices without bleeding: Secondary | ICD-10-CM | POA: Diagnosis not present

## 2019-06-01 DIAGNOSIS — R140 Abdominal distension (gaseous): Secondary | ICD-10-CM

## 2019-06-01 DIAGNOSIS — I771 Stricture of artery: Secondary | ICD-10-CM | POA: Diagnosis not present

## 2019-06-01 DIAGNOSIS — R1013 Epigastric pain: Secondary | ICD-10-CM | POA: Diagnosis not present

## 2019-06-01 DIAGNOSIS — F419 Anxiety disorder, unspecified: Secondary | ICD-10-CM | POA: Diagnosis not present

## 2019-06-01 DIAGNOSIS — Z720 Tobacco use: Secondary | ICD-10-CM | POA: Diagnosis not present

## 2019-06-01 DIAGNOSIS — K7460 Unspecified cirrhosis of liver: Secondary | ICD-10-CM | POA: Diagnosis not present

## 2019-06-01 DIAGNOSIS — K652 Spontaneous bacterial peritonitis: Secondary | ICD-10-CM | POA: Diagnosis not present

## 2019-06-01 DIAGNOSIS — Z7289 Other problems related to lifestyle: Secondary | ICD-10-CM | POA: Diagnosis not present

## 2019-06-01 HISTORY — PX: ESOPHAGOGASTRODUODENOSCOPY: SHX5428

## 2019-06-01 LAB — SARS CORONAVIRUS 2 BY RT PCR (HOSPITAL ORDER, PERFORMED IN ~~LOC~~ HOSPITAL LAB): SARS Coronavirus 2: NEGATIVE

## 2019-06-01 LAB — COMPREHENSIVE METABOLIC PANEL
ALT: 23 U/L (ref 0–44)
AST: 46 U/L — ABNORMAL HIGH (ref 15–41)
Albumin: 3 g/dL — ABNORMAL LOW (ref 3.5–5.0)
Alkaline Phosphatase: 261 U/L — ABNORMAL HIGH (ref 38–126)
Anion gap: 12 (ref 5–15)
BUN: 27 mg/dL — ABNORMAL HIGH (ref 6–20)
CO2: 22 mmol/L (ref 22–32)
Calcium: 8.9 mg/dL (ref 8.9–10.3)
Chloride: 103 mmol/L (ref 98–111)
Creatinine, Ser: 0.73 mg/dL (ref 0.44–1.00)
GFR calc Af Amer: >60 mL/min (ref >60)
GFR calc non Af Amer: >60 mL/min (ref >60)
Glucose, Bld: 126 mg/dL — ABNORMAL HIGH (ref 70–99)
Potassium: 4.3 mmol/L (ref 3.5–5.1)
Sodium: 137 mmol/L (ref 135–145)
Total Bilirubin: 1.9 mg/dL — ABNORMAL HIGH (ref 0.3–1.2)
Total Protein: 5.4 g/dL — ABNORMAL LOW (ref 6.5–8.1)

## 2019-06-01 LAB — CBC
HCT: 16 % — ABNORMAL LOW (ref 36.0–46.0)
HCT: 24.9 % — ABNORMAL LOW (ref 36.0–46.0)
Hemoglobin: 5.1 g/dL — ABNORMAL LOW (ref 12.0–15.0)
Hemoglobin: 8.2 g/dL — ABNORMAL LOW (ref 12.0–15.0)
MCH: 23.2 pg — ABNORMAL LOW (ref 26.0–34.0)
MCH: 25.5 pg — ABNORMAL LOW (ref 26.0–34.0)
MCHC: 31.9 g/dL (ref 30.0–36.0)
MCHC: 32.9 g/dL (ref 30.0–36.0)
MCV: 72.7 fL — ABNORMAL LOW (ref 80.0–100.0)
MCV: 77.6 fL — ABNORMAL LOW (ref 80.0–100.0)
Platelets: 223 10*3/uL (ref 150–400)
Platelets: 210 10*3/uL (ref 150–400)
RBC: 2.2 MIL/uL — ABNORMAL LOW (ref 3.87–5.11)
RBC: 3.21 MIL/uL — ABNORMAL LOW (ref 3.87–5.11)
RDW: 20.3 % — ABNORMAL HIGH (ref 11.5–15.5)
RDW: 20.7 % — ABNORMAL HIGH (ref 11.5–15.5)
WBC: 18 10*3/uL — ABNORMAL HIGH (ref 4.0–10.5)
WBC: 18 10*3/uL — ABNORMAL HIGH (ref 4.0–10.5)
nRBC: 0 % (ref 0.0–0.2)
nRBC: 0 % (ref 0.0–0.2)

## 2019-06-01 LAB — PROTIME-INR
INR: 1.4 — ABNORMAL HIGH (ref 0.8–1.2)
Prothrombin Time: 16.6 seconds — ABNORMAL HIGH (ref 11.4–15.2)

## 2019-06-01 LAB — AMMONIA: Ammonia: 73 umol/L — ABNORMAL HIGH (ref 9–35)

## 2019-06-01 LAB — RETICULOCYTES
Immature Retic Fract: 39.4 % — ABNORMAL HIGH (ref 2.3–15.9)
RBC.: 3.34 MIL/uL — ABNORMAL LOW (ref 3.87–5.11)
Retic Count, Absolute: 112.6 10*3/uL (ref 19.0–186.0)
Retic Ct Pct: 3.4 % — ABNORMAL HIGH (ref 0.4–3.1)

## 2019-06-01 LAB — TROPONIN I (HIGH SENSITIVITY)
Troponin I (High Sensitivity): 12 ng/L (ref <18)
Troponin I (High Sensitivity): 15 ng/L (ref <18)

## 2019-06-01 LAB — HEMOGLOBIN: Hemoglobin: 8.7 g/dL — ABNORMAL LOW (ref 12.0–15.0)

## 2019-06-01 LAB — APTT: aPTT: 29 seconds (ref 24–36)

## 2019-06-01 SURGERY — EGD (ESOPHAGOGASTRODUODENOSCOPY)
Anesthesia: General

## 2019-06-01 MED ORDER — MIDAZOLAM HCL 2 MG/2ML IJ SOLN
INTRAMUSCULAR | Status: AC
  Filled 2019-06-01: qty 2

## 2019-06-01 MED ORDER — SODIUM CHLORIDE 0.9 % IV SOLN
10.0000 mL/h | Freq: Once | INTRAVENOUS | Status: AC
  Administered 2019-06-03: 11:00:00 via INTRAVENOUS

## 2019-06-01 MED ORDER — SUCCINYLCHOLINE CHLORIDE 20 MG/ML IJ SOLN
INTRAMUSCULAR | Status: DC | PRN
  Administered 2019-06-01: 80 mg via INTRAVENOUS

## 2019-06-01 MED ORDER — PANTOPRAZOLE SODIUM 40 MG IV SOLR: 80.0000 mg | Freq: Once | INTRAVENOUS | Status: DC

## 2019-06-01 MED ORDER — FENTANYL CITRATE (PF) 100 MCG/2ML IJ SOLN
INTRAMUSCULAR | Status: DC | PRN
  Administered 2019-06-01 (×2): 50 ug via INTRAVENOUS

## 2019-06-01 MED ORDER — FENTANYL CITRATE (PF) 100 MCG/2ML IJ SOLN
INTRAMUSCULAR | Status: AC
  Administered 2019-06-01: 25 ug via INTRAVENOUS
  Filled 2019-06-01: qty 2

## 2019-06-01 MED ORDER — SODIUM CHLORIDE 0.9 % IV SOLN
INTRAVENOUS | Status: DC
  Administered 2019-06-01 – 2019-06-07 (×11): via INTRAVENOUS

## 2019-06-01 MED ORDER — PROPOFOL 10 MG/ML IV BOLUS
INTRAVENOUS | Status: AC
  Filled 2019-06-01: qty 20

## 2019-06-01 MED ORDER — SODIUM CHLORIDE 0.9 % IV SOLN
80.0000 mg | Freq: Once | INTRAVENOUS | Status: AC
  Administered 2019-06-01: 80 mg via INTRAVENOUS
  Filled 2019-06-01: qty 80

## 2019-06-01 MED ORDER — POLYETHYLENE GLYCOL 3350 17 G PO PACK
17.0000 g | PACK | Freq: Every day | ORAL | Status: DC | PRN
  Administered 2019-06-02 – 2019-06-05 (×2): 17 g via ORAL
  Filled 2019-06-01 (×3): qty 1

## 2019-06-01 MED ORDER — FENTANYL CITRATE (PF) 100 MCG/2ML IJ SOLN
25.0000 ug | INTRAMUSCULAR | Status: DC | PRN
  Administered 2019-06-01 (×6): 25 ug via INTRAVENOUS

## 2019-06-01 MED ORDER — OXYCODONE HCL 5 MG PO TABS
5.0000 mg | ORAL_TABLET | ORAL | Status: DC | PRN
  Administered 2019-06-01 – 2019-06-11 (×42): 5 mg via ORAL
  Filled 2019-06-01 (×43): qty 1

## 2019-06-01 MED ORDER — SUCCINYLCHOLINE CHLORIDE 20 MG/ML IJ SOLN
INTRAMUSCULAR | Status: AC
  Filled 2019-06-01: qty 1

## 2019-06-01 MED ORDER — MIDAZOLAM HCL 2 MG/2ML IJ SOLN
INTRAMUSCULAR | Status: DC | PRN
  Administered 2019-06-01: 1 mg via INTRAVENOUS

## 2019-06-01 MED ORDER — ONDANSETRON HCL 4 MG/2ML IJ SOLN
INTRAMUSCULAR | Status: DC | PRN
  Administered 2019-06-01: 4 mg via INTRAVENOUS

## 2019-06-01 MED ORDER — SODIUM CHLORIDE 0.9 % IV SOLN
8.0000 mg/h | INTRAVENOUS | Status: DC
  Administered 2019-06-01 – 2019-06-03 (×4): 8 mg/h via INTRAVENOUS
  Filled 2019-06-01 (×4): qty 80

## 2019-06-01 MED ORDER — ONDANSETRON HCL 4 MG/2ML IJ SOLN
4.0000 mg | Freq: Four times a day (QID) | INTRAMUSCULAR | Status: DC | PRN
  Administered 2019-06-04: 4 mg via INTRAVENOUS
  Filled 2019-06-01: qty 2

## 2019-06-01 MED ORDER — DEXAMETHASONE SODIUM PHOSPHATE 10 MG/ML IJ SOLN
INTRAMUSCULAR | Status: DC | PRN
  Administered 2019-06-01: 10 mg via INTRAVENOUS

## 2019-06-01 MED ORDER — DEXAMETHASONE SODIUM PHOSPHATE 4 MG/ML IJ SOLN
INTRAMUSCULAR | Status: AC
  Filled 2019-06-01: qty 1

## 2019-06-01 MED ORDER — LIDOCAINE HCL (CARDIAC) PF 100 MG/5ML IV SOSY
PREFILLED_SYRINGE | INTRAVENOUS | Status: DC | PRN
  Administered 2019-06-01: 40 mg via INTRAVENOUS

## 2019-06-01 MED ORDER — ONDANSETRON HCL 4 MG PO TABS: 4.0000 mg | ORAL_TABLET | Freq: Four times a day (QID) | ORAL | Status: DC | PRN

## 2019-06-01 MED ORDER — ONDANSETRON HCL 4 MG/2ML IJ SOLN
4.0000 mg | Freq: Once | INTRAMUSCULAR | Status: AC
  Administered 2019-06-01: 4 mg via INTRAVENOUS
  Filled 2019-06-01: qty 2

## 2019-06-01 MED ORDER — ONDANSETRON HCL 4 MG/2ML IJ SOLN
INTRAMUSCULAR | Status: AC
  Filled 2019-06-01: qty 2

## 2019-06-01 MED ORDER — ONDANSETRON HCL 4 MG/2ML IJ SOLN: 4.0000 mg | Freq: Once | INTRAMUSCULAR | Status: DC | PRN

## 2019-06-01 MED ORDER — OCTREOTIDE LOAD VIA INFUSION
50.0000 ug | Freq: Once | INTRAVENOUS | Status: AC
  Administered 2019-06-01: 50 ug via INTRAVENOUS
  Filled 2019-06-01: qty 25

## 2019-06-01 MED ORDER — PROPOFOL 10 MG/ML IV BOLUS
INTRAVENOUS | Status: DC | PRN
  Administered 2019-06-01: 140 mg via INTRAVENOUS

## 2019-06-01 MED ORDER — LACTATED RINGERS IV SOLN
INTRAVENOUS | Status: DC | PRN
  Administered 2019-06-01: 18:00:00 via INTRAVENOUS

## 2019-06-01 MED ORDER — SODIUM CHLORIDE 0.9 % IV SOLN
50.0000 ug/h | INTRAVENOUS | Status: DC
  Administered 2019-06-01 – 2019-06-03 (×4): 50 ug/h via INTRAVENOUS
  Filled 2019-06-01 (×9): qty 1

## 2019-06-01 MED ORDER — FENTANYL CITRATE (PF) 100 MCG/2ML IJ SOLN
INTRAMUSCULAR | Status: AC
  Filled 2019-06-01: qty 2

## 2019-06-01 MED ORDER — SODIUM CHLORIDE 0.9 % IV SOLN
1.0000 g | Freq: Once | INTRAVENOUS | Status: AC
  Administered 2019-06-01: 1 g via INTRAVENOUS
  Filled 2019-06-01: qty 10

## 2019-06-01 NOTE — ED Notes (Signed)
Pt to OR.

## 2019-06-01 NOTE — Consult Note (Signed)
GI Inpatient Consult Note  Reason for Consult: Hematemesis, decompensated alcoholic cirrhosis, diffuse abdominal pain   Attending Requesting Consult: Dr. Benjie Karvonen  History of Present Illness: Anita Reed is a 58 y.o. female seen for evaluation of decompensated alcoholic cirrhosis with known history of esophageal varices, acute GI bleeding at the request of Dr. Benjie Karvonen. Pt was diagnosed with decompensated alcoholic cirrhosis during hospitalization in May with abdominal ascites requiring paracentesis with fluid analysis consistent with portal hypertension. She followed up with Dr. Marius Ditch in July in office where she was not taking her diuretics. She had EGD performed 01/2019 showing severe portal hypertensive gastropathy and large varices in the lower third of the esophagus with no red wale signs or stigmata of recent bleeding, Dieulafoy's lesion which was clipped. She reports since her visit with Dr. Marius Ditch she has not been taking her prescribed diuretics or beta blocker for varices prophylaxis. She reports "I only take them when I need them." She reports over the past three days she has been experiencing hematemesis and hematochezia. She reports it started out as coffee-ground emesis and darker appearing stool on Tuesday of this week; however, last night it turned to bright red bloody emesis and hematochezia. She reports >10 episodes of hematemesis >10 episodes of hematochezia. She reports she developed diffuse abdominal this week as well -"I hurt all over." She endorses increased abdominal swelling. She denies any fever or chills. She reports for the abd pain at home she has been taking "8 ibuprofen at the same time multiple times a day." She reports she knows this wasn't smart but she was in a lot of pain so she had to do something. She called Dr. Marius Ditch and spoke with her this afternoon who wanted her to come to the ED immediately for further evaluation. Upon presentation to the ED, hemoglobin was 5.1, WBC 18.0,  ammonia 73, INR 1.4, alk phos 261, and Covid-19 test neg. She was hypotensive at 70/40s and tachycardic in the ED. She has received 2 units of pRBCs. Protonix and octreotide were started. Patient reports she is under a lot of financial hardships right now and is unsure how she is going to pay all of her bills. She reports she has not had a drop of alcohol since May.    Last Colonoscopy: >5 years ago Last Endoscopy:  GI Procedures: EGD 01/29/2019 - Normal duodenal bulb and second portion of the duodenum. - Severe portal hypertensive gastropathy in gastric fundus. - Large (> 5 mm) varices were found in the lower third of the esophagus, with no red wale signs or stigmata of recent bleed. - 2 cm hiatal hernia. - Dieulafoy lesion of stomach. - No specimens collected.   Past Medical History:  Past Medical History:  Diagnosis Date  . Anemia   . Cancer ([place])   . Cirrhosis ([place])   . Hypertension     Problem List: Patient Active Problem List   Diagnosis Date Noted  . GIB (gastrointestinal bleeding) 06/01/2019  . History of lymphoma 02/26/2019  . Typical atrial flutter ([place]) 02/07/2019  . Alcoholic cirrhosis of liver with ascites ([place]) 02/07/2019  . Persistent atrial fibrillation ([place]) 02/07/2019  . Anemia 02/07/2019  . Anorexia 02/07/2019  . GI bleed 01/28/2019  . Ascites 01/18/2019  . Cancer [facility])     Past Surgical History: Past Surgical History:  Procedure Laterality Date  . ABDOMINAL HYSTERECTOMY    . APPENDECTOMY    . ESOPHAGOGASTRODUODENOSCOPY N/A 01/29/2019   Procedure: ESOPHAGOGASTRODUODENOSCOPY (EGD);  Surgeon: [name],  MD;  Location: [facility] ENDOSCOPY;  Service: Gastroenterology;  Laterality: N/A;  . TONSILLECTOMY      Allergies: No Known Allergies  Home Medications: (Not in a hospital admission)  Home medication reconciliation was completed with the patient.   Scheduled Inpatient Medications:    Continuous Inpatient Infusions:   . sodium chloride    .  octreotide  (SANDOSTATIN)    IV infusion 50 mcg/hr (06/01/19 1335)  . pantoprozole (PROTONIX) infusion 8 mg/hr (06/01/19 1358)    PRN Inpatient Medications:  oxyCODONE  Family History: family history includes Cancer in her father and mother.  The patient's family history is negative for inflammatory bowel disorders, GI malignancy, or solid organ transplantation.  Social History:   reports that she has been smoking. She has never used smokeless tobacco. She reports previous alcohol use. She reports that she does not use drugs. The patient denies ETOH, tobacco, or drug use.   Review of Systems: Constitutional: Weight is stable.  Eyes: No changes in vision. ENT: No oral lesions, sore throat.  GI: see HPI.  Heme/Lymph: No easy bruising.  CV: No chest pain.  GU: No hematuria.  Integumentary: No rashes.  Neuro: No headaches.  Psych: No depression/anxiety.  Endocrine: No heat/cold intolerance.  Allergic/Immunologic: No urticaria.  Resp: No cough, SOB.  Musculoskeletal: No joint swelling.    Physical Examination: BP 103/79   Pulse (!) 109   Temp 98.4 F (36.9 C) (Oral)   Resp 16   Ht _0  (1.753 m)   Wt 61.2 kg   SpO2 100%   BMI 19.94 kg/m  Ill-appearing female in ED stretcher. No accessory muscle use. Gen: NAD, alert and oriented x 4 HEENT: PEERLA, EOMI, Neck: supple, no JVD or thyromegaly Chest: CTA bilaterally, no wheezes, crackles, or other adventitious sounds CV: tachycardic, regular rhythm, no m/g/c/r Abd: mildly distended abdomen, +BS in all four quadrants; diffusely tender to palpation, +ascites, no HSM, guarding, ridigity, or rebound tenderness Ext: no edema, well perfused with 2+ pulses, Skin: no rash or lesions noted Lymph: no LAD  Data: Lab Results  Component Value Date   WBC 18.0 (H) 06/01/2019   HGB 5.1 (L) 06/01/2019   HCT 16.0 (L) 06/01/2019   MCV 72.7 (L) 06/01/2019   PLT 223 06/01/2019   Recent Labs  Lab 06/01/19 1235  HGB 5.1*   Lab Results   Component Value Date   NA 137 06/01/2019   K 4.3 06/01/2019   CL 103 06/01/2019   CO2 22 06/01/2019   BUN 27 (H) 06/01/2019   CREATININE 0.73 06/01/2019   Lab Results  Component Value Date   ALT 23 06/01/2019   AST 46 (H) 06/01/2019   ALKPHOS 261 (H) 06/01/2019   BILITOT 1.9 (H) 06/01/2019   Recent Labs  Lab 06/01/19 1251  APTT 29  INR 1.4*   Assessment/Plan:  58 y/o Caucasian female with a PMH of decompensated alcoholic cirrhosis presents to the ED for acute GI bleeding  1. Acute GI bleeding - hemoglobin 5.1. She has received 2 units pRBCs 2. Decompensated alcoholic cirrhosis with ascites and varices 3. Portal hypertensive gastropathy 4. Hx of HCV s/p treatment with SVR 5. Hx of UGI bleed 2/2 Dielafoy's lesion 6. Overuse of NSAIDs - excessive amount of ibuprofen x2 weeks  Covid-19 test negative  - 3-day history of hematemesis and hematochezia. She presented to the ED this afternoon and found to have hemoglobin 5.1. She has received 2 units of pRBCs. She is hypotensive and tachycardic.  -  Differential includes variceal bleeding, Dieulafoy's lesion, peptic ulcer disease, gastritis +/- H pylori, GAVE, esophagitis, duodenitis, small bowel bleeding, malignancy  - I advise emergent EGD this afternoon with Dr. Alice Reichert given her history of decompensated alcoholic cirrhosis with ascites and known hx of large esophageal varices.  - NPO status - Continue Protonix and octreotide  - Avoid all NSAIDs - After EGD and stabilization, patient will need diagnostic paracentesis to rule out SBP. She has received ceftriaxone in the ED to cover for any underlying SBP.  - We discussed the upmost importance of compliance with diuretic regimen, low-salt diet, and strict avoidance of alcohol - Further recommendations after EGD - GI will continue to follow along over the weekend  - I reviewed the risks (including bleeding, perforation, infection, anesthesia complications, cardiac/respiratory  complications), benefits and alternatives of EGD. Patient consents to proceed.     Thank you for the consult. Please call with questions or concerns.  Reeves Forth [facility] [phone] (Cell)

## 2019-06-01 NOTE — ED Notes (Signed)
First Nurse Note: Pt states that she is having bloody stools and that she is vomiting blood. Pt states that "I feel like I am on the verge of a heart attack". Pt is in NAD at this time.

## 2019-06-01 NOTE — ED Triage Notes (Signed)
Reports abdominal pain and reports bright red blood emesis and bright red blood BM that began yesterday. Pt with hx of liver cirrhosis. Pt reports tachycardia. Alert and oriented X 4.

## 2019-06-01 NOTE — Progress Notes (Addendum)
Pt was NPO but only have oxycodone tablet PRN for pain. Notify prime and ordered to modify the diet to NPO except with meds. Will continue to monitor.  Update 0038: Pt have no relief with oxycodone page prime and talked to dr. Jannifer Franklin and states will place order. Will continue to monitor.  Update 0127: Pt still have black liquidy stool x 6 (small size). Hbg from 8.2 now at 8.7. Notify prime. Will continue to monitor.  Update 0132: Talked to Dr. Jannifer Franklin and states to continue monitor patient at this time. Will continue to monitor.  Update 0211: Pt HR is going from 122-130. Notify prime and talked to Dr. Jannifer Franklin states will place order. Will continue to monitor.

## 2019-06-01 NOTE — ED Notes (Signed)
Pt clothing, glasses and cellphone in belongings bag and going to surgery with pt.

## 2019-06-01 NOTE — H&P (Signed)
[place] [facility] [place] NAME: Anita Reed    MR#:  [id number]  DATE OF BIRTH:  05/01/1961  DATE OF ADMISSION:  06/01/2019  PRIMARY CARE PHYSICIAN: Hubbard Hartshorn, FNP   REQUESTING/REFERRING PHYSICIAN: dr Royden Purl  CHIEF COMPLAINT:   Blood loss HISTORY OF PRESENT ILLNESS:  Anita Reed  is a 58 y.o. female with a known history of liver cirrhosis and portal hypertension due to chronic EtOH use who presents today to the emergency room due to abdominal pain and vomiting blood.  Patient reports she has been taking and a numerous amount of Advil due to her chronic pain and over the past 24 hours she has had melena as well as hematemesis.  In the emergency room her hemoglobin is 5.  She is being transfused 2 units PRBC.  Her blood pressure is low.  She does have dizziness but denies shortness of breath or chest pain.  PAST MEDICAL HISTORY:   Past Medical History:  Diagnosis Date  . Anemia   . Cancer ([place])   . Cirrhosis ([place])   . Hypertension     PAST SURGICAL HISTORY:   Past Surgical History:  Procedure Laterality Date  . ABDOMINAL HYSTERECTOMY    . APPENDECTOMY    . ESOPHAGOGASTRODUODENOSCOPY N/A 01/29/2019   Procedure: ESOPHAGOGASTRODUODENOSCOPY (EGD);  Surgeon: [name], MD;  Location: [facility] ENDOSCOPY;  Service: Gastroenterology;  Laterality: N/A;  . TONSILLECTOMY      SOCIAL HISTORY:   Social History   Tobacco Use  . Smoking status: Current Every Day Smoker  . Smokeless tobacco: Never Used  Substance Use Topics  . Alcohol use: Not Currently    FAMILY HISTORY:   Family History  Problem Relation Age of Onset  . Cancer Mother   . Cancer Father     DRUG ALLERGIES:  No Known Allergies  REVIEW OF SYSTEMS:   Review of Systems  Constitutional: Positive for malaise/fatigue. Negative for chills and fever.  HENT: Negative.  Negative for ear discharge, ear pain, hearing loss, nosebleeds and sore throat.   Eyes: Negative.   Negative for blurred vision and pain.  Respiratory: Negative.  Negative for cough, hemoptysis, shortness of breath and wheezing.   Cardiovascular: Negative.  Negative for chest pain, palpitations and leg swelling.  Gastrointestinal: Negative.  Negative for abdominal pain, blood in stool, diarrhea, nausea and vomiting.  Genitourinary: Negative.  Negative for dysuria.  Musculoskeletal: Negative.  Negative for back pain.  Skin: Negative.   Neurological: Positive for dizziness. Negative for tremors, speech change, focal weakness, seizures and headaches.  Endo/Heme/Allergies: Negative.  Does not bruise/bleed easily.  Psychiatric/Behavioral: Negative.  Negative for depression, hallucinations and suicidal ideas.    MEDICATIONS AT HOME:   Prior to Admission medications   Medication Sig Start Date End Date Taking? Authorizing Provider  furosemide (LASIX) 40 MG tablet Take 1 tablet (40 mg total) by mouth 2 (two) times daily. Patient taking differently: Take 40 mg by mouth 2 (two) times daily as needed for fluid.  01/31/19 06/01/19 Yes Pyreddy, Reatha Harps, MD  linaclotide (LINZESS) 145 MCG CAPS capsule Take 145 mcg by mouth daily as needed (constipation).   Yes [provider]  pantoprazole (PROTONIX) 40 MG tablet Take 1 tablet (40 mg total) by mouth 2 (two) times daily. 01/31/19 01/31/20 Yes Pyreddy, Reatha Harps, MD  tiZANidine (ZANAFLEX) 4 MG tablet Take 0.5-1 tablets (2-4 mg total) by mouth every 8 (eight) hours as needed for muscle spasms. 04/02/19  Yes Raelyn Ensign  E, FNP  nadolol (CORGARD) 20 MG tablet Take 1 tablet (20 mg total) by mouth daily. Patient not taking: Reported on 06/01/2019 04/23/19 07/22/19  [name], MD  Potassium Chloride ER 20 MEQ TBCR Take one tablet twice daily Patient not taking: Reported on 04/02/2019 02/26/19   Hubbard Hartshorn, FNP  spironolactone (ALDACTONE) 50 MG tablet Take 2 tablets (100 mg total) by mouth daily. Patient not taking: Reported on 06/01/2019 02/09/19 05/10/19   [name], MD      VITAL SIGNS:  Blood pressure (!) 73/53, pulse (!) 108, temperature 98.4 F (36.9 C), temperature source Oral, resp. rate (!) 21, height 5\' 9"  (1.753 m), weight 61.2 kg, SpO2 98 %.  PHYSICAL EXAMINATION:   Physical Exam Constitutional:      General: She is not in acute distress. HENT:     Head: Normocephalic.  Eyes:     General: No scleral icterus. Neck:     Musculoskeletal: Normal range of motion and neck supple.     Vascular: No JVD.     Trachea: No tracheal deviation.  Cardiovascular:     Rate and Rhythm: Normal rate and regular rhythm.     Heart sounds: Normal heart sounds. No murmur. No friction rub. No gallop.   Pulmonary:     Effort: Pulmonary effort is normal. No respiratory distress.     Breath sounds: Normal breath sounds. No wheezing or rales.  Chest:     Chest wall: No tenderness.  Abdominal:     General: Bowel sounds are normal. There is no distension.     Palpations: Abdomen is soft. There is no mass.     Tenderness: There is no abdominal tenderness. There is no guarding or rebound.  Musculoskeletal: Normal range of motion.  Skin:    General: Skin is warm.     Findings: No erythema or rash.  Neurological:     Mental Status: She is alert and oriented to person, place, and time.  Psychiatric:        Judgment: Judgment normal.       LABORATORY PANEL:   CBC Recent Labs  Lab 06/01/19 1235  WBC 18.0*  HGB 5.1*  HCT 16.0*  PLT 223   ------------------------------------------------------------------------------------------------------------------  Chemistries  Recent Labs  Lab 06/01/19 1235  NA 137  K 4.3  CL 103  CO2 22  GLUCOSE 126*  BUN 27*  CREATININE 0.73  CALCIUM 8.9  AST 46*  ALT 23  ALKPHOS 261*  BILITOT 1.9*   ------------------------------------------------------------------------------------------------------------------  Cardiac Enzymes No results for input(s): TROPONINI in the last 168  hours. ------------------------------------------------------------------------------------------------------------------  RADIOLOGY:  No results found.  EKG:  Shaky background No ST elevation Possible irregular rhythm  IMPRESSION AND PLAN:   58 year old female with liver cirrhosis and portal hypertension who presents to the emergency room due to melena and hematemesis.   1.  Upper GI bleed from underlying liver cirrhosis/portal hypertension in the setting of using NSAIDs. Continue PPI and octreotide Patient transfused 2 unit PRBC in ER Anemia panel GI consultation requested via epic and ER physician was to inform GI.  2.  Acute on chronic blood loss anemia due to problem #1: Patient will receive 2 unit PRBC Follow hemoglobin Anemia panel ordered  3.  Portal hypertension due to liver cirrhosis from chronic EtOH: Hold all medications including spironolactone, nadolol and Lasix for now due to low blood pressure and GI blood loss.  4.  EtOH abuse: Patient has not had any alcohol for several  months  5.  Irregular rhythm: Continue telemetry monitoring    All the records are reviewed and case discussed with ED provider. Management plans discussed with the patient and she is in agreement  CODE STATUS: full  Critical care TOTAL TIME TAKING CARE OF THIS PATIENT: 60 minutes.    Bettey Costa M.D on 06/01/2019 at 2:27 PM  Between 7am to 6pm - Pager - [phone]  After 6pm go to [email or web address] - password [email or web address] [facility]  Office  [phone]  CC: Primary care physician; Hubbard Hartshorn, FNP

## 2019-06-01 NOTE — ED Notes (Signed)
Pt husband updated per pt request.  

## 2019-06-01 NOTE — H&P (View-Only) (Signed)
GI Inpatient Consult Note  Reason for Consult: Hematemesis, decompensated alcoholic cirrhosis, diffuse abdominal pain   Attending Requesting Consult: Dr. Benjie Karvonen  History of Present Illness: Anita Reed is a 58 y.o. female seen for evaluation of decompensated alcoholic cirrhosis with known history of esophageal varices, acute GI bleeding at the request of Dr. Benjie Karvonen. Pt was diagnosed with decompensated alcoholic cirrhosis during hospitalization in May with abdominal ascites requiring paracentesis with fluid analysis consistent with portal hypertension. She followed up with Dr. Marius Ditch in July in office where she was not taking her diuretics. She had EGD performed 01/2019 showing severe portal hypertensive gastropathy and large varices in the lower third of the esophagus with no red wale signs or stigmata of recent bleeding, Dieulafoy's lesion which was clipped. She reports since her visit with Dr. Marius Ditch she has not been taking her prescribed diuretics or beta blocker for varices prophylaxis. She reports "I only take them when I need them." She reports over the past three days she has been experiencing hematemesis and hematochezia. She reports it started out as coffee-ground emesis and darker appearing stool on Tuesday of this week; however, last night it turned to bright red bloody emesis and hematochezia. She reports >10 episodes of hematemesis >10 episodes of hematochezia. She reports she developed diffuse abdominal this week as well -"I hurt all over." She endorses increased abdominal swelling. She denies any fever or chills. She reports for the abd pain at home she has been taking "8 ibuprofen at the same time multiple times a day." She reports she knows this wasn't smart but she was in a lot of pain so she had to do something. She called Dr. Marius Ditch and spoke with her this afternoon who wanted her to come to the ED immediately for further evaluation. Upon presentation to the ED, hemoglobin was 5.1, WBC 18.0,  ammonia 73, INR 1.4, alk phos 261, and Covid-19 test neg. She was hypotensive at 70/40s and tachycardic in the ED. She has received 2 units of pRBCs. Protonix and octreotide were started. Patient reports she is under a lot of financial hardships right now and is unsure how she is going to pay all of her bills. She reports she has not had a drop of alcohol since May.    Last Colonoscopy: >5 years ago Last Endoscopy:  GI Procedures: EGD 01/29/2019 - Normal duodenal bulb and second portion of the duodenum. - Severe portal hypertensive gastropathy in gastric fundus. - Large (> 5 mm) varices were found in the lower third of the esophagus, with no red wale signs or stigmata of recent bleed. - 2 cm hiatal hernia. - Dieulafoy lesion of stomach. - No specimens collected.   Past Medical History:  Past Medical History:  Diagnosis Date  . Anemia   . Cancer ([place])   . Cirrhosis ([place])   . Hypertension     Problem List: Patient Active Problem List   Diagnosis Date Noted  . GIB (gastrointestinal bleeding) 06/01/2019  . History of lymphoma 02/26/2019  . Typical atrial flutter ([place]) 02/07/2019  . Alcoholic cirrhosis of liver with ascites ([place]) 02/07/2019  . Persistent atrial fibrillation ([place]) 02/07/2019  . Anemia 02/07/2019  . Anorexia 02/07/2019  . GI bleed 01/28/2019  . Ascites 01/18/2019  . Cancer [facility])     Past Surgical History: Past Surgical History:  Procedure Laterality Date  . ABDOMINAL HYSTERECTOMY    . APPENDECTOMY    . ESOPHAGOGASTRODUODENOSCOPY N/A 01/29/2019   Procedure: ESOPHAGOGASTRODUODENOSCOPY (EGD);  Surgeon: [name],  MD;  Location: [facility] ENDOSCOPY;  Service: Gastroenterology;  Laterality: N/A;  . TONSILLECTOMY      Allergies: No Known Allergies  Home Medications: (Not in a hospital admission)  Home medication reconciliation was completed with the patient.   Scheduled Inpatient Medications:    Continuous Inpatient Infusions:   . sodium chloride    .  octreotide  (SANDOSTATIN)    IV infusion 50 mcg/hr (06/01/19 1335)  . pantoprozole (PROTONIX) infusion 8 mg/hr (06/01/19 1358)    PRN Inpatient Medications:  oxyCODONE  Family History: family history includes Cancer in her father and mother.  The patient's family history is negative for inflammatory bowel disorders, GI malignancy, or solid organ transplantation.  Social History:   reports that she has been smoking. She has never used smokeless tobacco. She reports previous alcohol use. She reports that she does not use drugs. The patient denies ETOH, tobacco, or drug use.   Review of Systems: Constitutional: Weight is stable.  Eyes: No changes in vision. ENT: No oral lesions, sore throat.  GI: see HPI.  Heme/Lymph: No easy bruising.  CV: No chest pain.  GU: No hematuria.  Integumentary: No rashes.  Neuro: No headaches.  Psych: No depression/anxiety.  Endocrine: No heat/cold intolerance.  Allergic/Immunologic: No urticaria.  Resp: No cough, SOB.  Musculoskeletal: No joint swelling.    Physical Examination: BP 103/79   Pulse (!) 109   Temp 98.4 F (36.9 C) (Oral)   Resp 16   Ht _0  (1.753 m)   Wt 61.2 kg   SpO2 100%   BMI 19.94 kg/m  Ill-appearing female in ED stretcher. No accessory muscle use. Gen: NAD, alert and oriented x 4 HEENT: PEERLA, EOMI, Neck: supple, no JVD or thyromegaly Chest: CTA bilaterally, no wheezes, crackles, or other adventitious sounds CV: tachycardic, regular rhythm, no m/g/c/r Abd: mildly distended abdomen, +BS in all four quadrants; diffusely tender to palpation, +ascites, no HSM, guarding, ridigity, or rebound tenderness Ext: no edema, well perfused with 2+ pulses, Skin: no rash or lesions noted Lymph: no LAD  Data: Lab Results  Component Value Date   WBC 18.0 (H) 06/01/2019   HGB 5.1 (L) 06/01/2019   HCT 16.0 (L) 06/01/2019   MCV 72.7 (L) 06/01/2019   PLT 223 06/01/2019   Recent Labs  Lab 06/01/19 1235  HGB 5.1*   Lab Results   Component Value Date   NA 137 06/01/2019   K 4.3 06/01/2019   CL 103 06/01/2019   CO2 22 06/01/2019   BUN 27 (H) 06/01/2019   CREATININE 0.73 06/01/2019   Lab Results  Component Value Date   ALT 23 06/01/2019   AST 46 (H) 06/01/2019   ALKPHOS 261 (H) 06/01/2019   BILITOT 1.9 (H) 06/01/2019   Recent Labs  Lab 06/01/19 1251  APTT 29  INR 1.4*   Assessment/Plan:  58 y/o Caucasian female with a PMH of decompensated alcoholic cirrhosis presents to the ED for acute GI bleeding  1. Acute GI bleeding - hemoglobin 5.1. She has received 2 units pRBCs 2. Decompensated alcoholic cirrhosis with ascites and varices 3. Portal hypertensive gastropathy 4. Hx of HCV s/p treatment with SVR 5. Hx of UGI bleed 2/2 Dielafoy's lesion 6. Overuse of NSAIDs - excessive amount of ibuprofen x2 weeks  Covid-19 test negative  - 3-day history of hematemesis and hematochezia. She presented to the ED this afternoon and found to have hemoglobin 5.1. She has received 2 units of pRBCs. She is hypotensive and tachycardic.  -  Differential includes variceal bleeding, Dieulafoy's lesion, peptic ulcer disease, gastritis +/- H pylori, GAVE, esophagitis, duodenitis, small bowel bleeding, malignancy  - I advise emergent EGD this afternoon with Dr. Alice Reichert given her history of decompensated alcoholic cirrhosis with ascites and known hx of large esophageal varices.  - NPO status - Continue Protonix and octreotide  - Avoid all NSAIDs - After EGD and stabilization, patient will need diagnostic paracentesis to rule out SBP. She has received ceftriaxone in the ED to cover for any underlying SBP.  - We discussed the upmost importance of compliance with diuretic regimen, low-salt diet, and strict avoidance of alcohol - Further recommendations after EGD - GI will continue to follow along over the weekend  - I reviewed the risks (including bleeding, perforation, infection, anesthesia complications, cardiac/respiratory  complications), benefits and alternatives of EGD. Patient consents to proceed.     Thank you for the consult. Please call with questions or concerns.  Reeves Forth [facility] [phone] (Cell)

## 2019-06-01 NOTE — Telephone Encounter (Signed)
Patient contacted office states that she is experiencing massive amount of blood, and vomiting.  Patient has been advised to go to ER immediately.  She states she does not have the money to pay for ambulance service and will have someone to take her to ER between 12-1 today.  Patient has been advised to go to ER immediately and refused due to transportation.  Informed her that I would make you aware of this.  Thanks [employer]

## 2019-06-01 NOTE — Anesthesia Postprocedure Evaluation (Signed)
Anesthesia Post Note  Patient: Anita Reed  Procedure(s) Performed: ESOPHAGOGASTRODUODENOSCOPY (EGD) (N/A )  Patient location during evaluation: Endoscopy Anesthesia Type: General Level of consciousness: awake and alert Pain management: pain level controlled Vital Signs Assessment: post-procedure vital signs reviewed and stable Respiratory status: spontaneous breathing, nonlabored ventilation, respiratory function stable and patient connected to nasal cannula oxygen Cardiovascular status: blood pressure returned to baseline and stable Postop Assessment: no apparent nausea or vomiting Anesthetic complications: no     Last Vitals:  Vitals:   06/01/19 1927 06/01/19 1942  BP: (!) 117/97 127/73  Pulse: (!) 115 (!) 115  Resp: (!) 22 16  Temp:    SpO2: 96% 92%    Last Pain:  Vitals:   06/01/19 1857  TempSrc:   PainSc: [place]

## 2019-06-01 NOTE — ED Notes (Signed)
.. ED TO INPATIENT HANDOFF REPORT  ED Nurse Name and Phone #: Ancelmo Hunt [phone]  S Name/Age/Gender Anita Reed 58 y.o. female Room/Bed: ED15A/ED15A  Code Status   Code Status: Prior  Home/SNF/Other Home Patient oriented to: self, place, time and situation Is this baseline? Yes   Triage Complete: Triage complete  Chief Complaint GI Bleed   Triage Note Reports abdominal pain and reports bright red blood emesis and bright red blood BM that began yesterday. Pt with hx of liver cirrhosis. Pt reports tachycardia. Alert and oriented X 4.   Allergies No Known Allergies  Level of Care/Admitting Diagnosis ED Disposition    ED Disposition Condition [facility] Area: [place] [[id number]]  Level of Care: Stepdown [14]  Covid Evaluation: N/A  Diagnosis: GIB (gastrointestinal bleeding) [id number]  Admitting Physician: Bettey Costa [id number]  Attending Physician: MODY, Ulice Bold [id number]  Estimated length of stay: 3 - 4 days  Certification:: I certify this patient will need inpatient services for at least 2 midnights  PT Class (Do Not Modify): Inpatient [101]  PT Acc Code (Do Not Modify): Private [1]       B Medical/Surgery History Past Medical History:  Diagnosis Date  . Anemia   . Cancer ([place])   . Cirrhosis ([place])   . Hypertension    Past Surgical History:  Procedure Laterality Date  . ABDOMINAL HYSTERECTOMY    . APPENDECTOMY    . ESOPHAGOGASTRODUODENOSCOPY N/A 01/29/2019   Procedure: ESOPHAGOGASTRODUODENOSCOPY (EGD);  Surgeon: [name], MD;  Location: [facility] ENDOSCOPY;  Service: Gastroenterology;  Laterality: N/A;  . TONSILLECTOMY       A IV Location/Drains/Wounds Patient Lines/Drains/Airways Status   Active Line/Drains/Airways    Name:   Placement date:   Placement time:   Site:   Days:   Peripheral IV 06/01/19 Right Hand   06/01/19    1318    Hand   less than 1   Peripheral IV 06/01/19 Left Arm   06/01/19    1356    Arm   less  than 1   Peripheral IV 06/01/19 Right Antecubital   06/01/19    1400    Antecubital   less than 1          Intake/Output Last 24 hours  Intake/Output Summary (Last 24 hours) at 06/01/2019 1433 Last data filed at 06/01/2019 1407 Gross per 24 hour  Intake 100 ml  Output -  Net 100 ml    Labs/Imaging Results for orders placed or performed during the hospital encounter of 06/01/19 (from the past 48 hour(s))  Comprehensive metabolic panel     Status: Abnormal   Collection Time: 06/01/19 12:35 PM  Result Value Ref Range   Sodium 137 135 - 145 mmol/L   Potassium 4.3 3.5 - 5.1 mmol/L   Chloride 103 98 - 111 mmol/L   CO2 22 22 - 32 mmol/L   Glucose, Bld 126 (H) 70 - 99 mg/dL   BUN 27 (H) 6 - 20 mg/dL   Creatinine, Ser 0.73 0.44 - 1.00 mg/dL   Calcium 8.9 8.9 - 10.3 mg/dL   Total Protein 5.4 (L) 6.5 - 8.1 g/dL   Albumin 3.0 (L) 3.5 - 5.0 g/dL   AST 46 (H) 15 - 41 U/L   ALT 23 0 - 44 U/L   Alkaline Phosphatase 261 (H) 38 - 126 U/L   Total Bilirubin 1.9 (H) 0.3 - 1.2 mg/dL   GFR calc non Af Amer >60 >60 mL/min  GFR calc Af Amer >60 >60 mL/min   Anion gap 12 5 - 15    Comment: Performed at [facility], [address] [place], [place] [address]  CBC     Status: Abnormal   Collection Time: 06/01/19 12:35 PM  Result Value Ref Range   WBC 18.0 (H) 4.0 - 10.5 K/uL   RBC 2.20 (L) 3.87 - 5.11 MIL/uL   Hemoglobin 5.1 (L) 12.0 - 15.0 g/dL    Comment: Reticulocyte Hemoglobin testing may be clinically indicated, consider ordering this additional test [id number]    HCT 16.0 (L) 36.0 - 46.0 %   MCV 72.7 (L) 80.0 - 100.0 fL   MCH 23.2 (L) 26.0 - 34.0 pg   MCHC 31.9 30.0 - 36.0 g/dL   RDW 20.3 (H) 11.5 - 15.5 %   Platelets 223 150 - 400 K/uL   nRBC 0.0 0.0 - 0.2 %    Comment: Performed at [facility], [address] [place], [place] [address]  Type and screen [place]     Status: None (Preliminary result)   Collection Time: 06/01/19 12:35  PM  Result Value Ref Range   ABO/RH(D) O POS    Antibody Screen NEG    Sample Expiration      06/04/2019,2359 Performed at [facility], [address], [place], [place] [address]    Unit Number [id number]    Blood Component Type RED CELLS,LR    Unit division 00    Status of Unit ISSUED    Unit tag comment EMERGENCY RELEASE    Transfusion Status OK TO TRANSFUSE    Crossmatch Result COMPATIBLE    Unit Number [id number]    Blood Component Type RED CELLS,LR    Unit division 00    Status of Unit ISSUED    Unit tag comment EMERGENCY RELEASE    Transfusion Status OK TO TRANSFUSE    Crossmatch Result COMPATIBLE   Ammonia     Status: Abnormal   Collection Time: 06/01/19 12:46 PM  Result Value Ref Range   Ammonia 73 (H) 9 - 35 umol/L    Comment: Performed at [facility], [address] [place], [place] [address]  Protime-INR     Status: Abnormal   Collection Time: 06/01/19 12:51 PM  Result Value Ref Range   Prothrombin Time 16.6 (H) 11.4 - 15.2 seconds   INR 1.4 (H) 0.8 - 1.2    Comment: (NOTE) INR goal varies based on device and disease states. Performed at [facility], [address] [place], [place] [address]   APTT     Status: None   Collection Time: 06/01/19 12:51 PM  Result Value Ref Range   aPTT 29 24 - 36 seconds    Comment: Performed at [facility], [place], [place] [address]  Troponin I (High Sensitivity)     Status: None   Collection Time: 06/01/19 12:51 PM  Result Value Ref Range   Troponin I (High Sensitivity) 12 <18 ng/L    Comment: (NOTE) Elevated high sensitivity troponin I (hsTnI) values and significant  changes across serial measurements may suggest ACS but many other  chronic and acute conditions are known to elevate hsTnI results.  Refer to the "Links" section for chest pain algorithms and additional  guidance. Performed at [facility], [address] [place], [place]  [address]   Prepare RBC     Status: None   Collection Time: 06/01/19  1:21  PM  Result Value Ref Range   Order Confirmation      ORDER PROCESSED BY BLOOD BANK Performed at [facility], [address] [place], [place] [address]    No results found.  Pending Labs [employer] (From admission, onward)    Start     Ordered   06/01/19 1358  SARS Coronavirus 2 [facility] order, Performed in [facility] hospital lab) Nasopharyngeal Nasopharyngeal Swab  (Symptomatic/High Risk of Exposure/Tier 1 Patients Labs with Precautions)  ONCE - STAT,   STAT    Question Answer Comment  Is this test for diagnosis or screening Diagnosis of ill patient   Symptomatic for COVID-19 as defined by CDC Yes   Date of Symptom Onset 06/01/2019   Hospitalized for COVID-19 Yes   Admitted to ICU for COVID-19 No   Previously tested for COVID-19 No   Resident in a congregate (group) care setting No   Employed in healthcare setting No   Pregnant No      06/01/19 1357   Signed and Held  Basic metabolic panel  Tomorrow morning,   R     Signed and Held   Signed and Held  CBC  Tomorrow morning,   R     Signed and Held   Signed and Held  Vitamin B12  (Anemia Panel (PNL))  Once,   R     Signed and Held   Signed and Held  Folate  (Anemia Panel (PNL))  Once,   R     Signed and Held   Signed and Held  Iron and TIBC  (Anemia Panel (PNL))  Once,   R     Signed and Held   Signed and Held  Ferritin  (Anemia Panel (PNL))  Once,   R     Signed and Held   Signed and Held  Reticulocytes  (Anemia Panel (PNL))  Once,   R     Signed and Held          Vitals/Pain Today's Vitals   06/01/19 1405 06/01/19 1410 06/01/19 1420 06/01/19 1430  BP: (!) 78/56 (!) 73/53 (!) 84/66 (!) 87/66  Pulse: (!) 107 (!) 108 (!) 103 (!) 103  Resp: 20 (!) 21 (!) 21 (!) 26  Temp:      TempSrc:      SpO2: 100% 98% 99% 99%  Weight:      Height:      PainSc:        Isolation Precautions No active  isolations  Medications Medications  octreotide (SANDOSTATIN) 2 mcg/mL load via infusion 50 mcg (50 mcg Intravenous Bolus from Bag 06/01/19 1337)    And  octreotide (SANDOSTATIN) 500 mcg in sodium chloride 0.9 % 250 mL (2 mcg/mL) infusion (50 mcg/hr Intravenous New Bag/Given 06/01/19 1335)  pantoprazole (PROTONIX) 80 mg in sodium chloride 0.9 % 250 mL (0.32 mg/mL) infusion (8 mg/hr Intravenous New Bag/Given 06/01/19 1358)  0.9 %  sodium chloride infusion (has no administration in time range)  ondansetron (ZOFRAN) injection 4 mg (4 mg Intravenous Given 06/01/19 1322)  cefTRIAXone (ROCEPHIN) 1 g in sodium chloride 0.9 % 100 mL IVPB (0 g Intravenous Stopped 06/01/19 1407)  pantoprazole (PROTONIX) 80 mg in sodium chloride 0.9 % 100 mL IVPB (80 mg Intravenous New Bag/Given 06/01/19 1359)    Mobility walks Low fall risk   Focused Assessments GI   R Recommendations: See Admitting Provider Note  Report given to:   Additional Notes:

## 2019-06-01 NOTE — ED Notes (Signed)
This RN attempted to call report at this time.  

## 2019-06-01 NOTE — Telephone Encounter (Signed)
Called patient, she said she will be in ER in 79min, packing her stuff. I reiterated to her that she might be bleeding from varices given h/o decompensated cirrhosis, so go to hospital ASAP. She expressed understanding of the severity of her symptoms  RV

## 2019-06-01 NOTE — Progress Notes (Signed)
Patient's hemoglobin more than 8 after transfusion, presently admitted to the ICU but ICU has no availability, will admit to telemetry at this time as patient is hemodynamically stable with stable hemoglobin/continue octreotide drip, Protonix infusion

## 2019-06-01 NOTE — Transfer of Care (Signed)
Immediate Anesthesia Transfer of Care Note  Patient: Anita Reed  Procedure(s) Performed: ESOPHAGOGASTRODUODENOSCOPY (EGD) (N/A )  Patient Location: PACU  Anesthesia Type:General  Level of Consciousness: awake and alert   Airway & Oxygen Therapy: Patient Spontanous Breathing  Post-op Assessment: Report given to RN and Post -op Vital signs reviewed and stable  Post vital signs: Reviewed and stable  Last Vitals:  Vitals Value Taken Time  BP    Temp    Pulse 116 06/01/19 1812  Resp 16 06/01/19 1812  SpO2 93 % 06/01/19 1812  Vitals shown include unvalidated device data.  Last Pain:  Vitals:   06/01/19 1736  TempSrc:   PainSc: 0-No pain         Complications: No apparent anesthesia complications

## 2019-06-01 NOTE — Anesthesia Post-op Follow-up Note (Signed)
Anesthesia QCDR form completed.        

## 2019-06-01 NOTE — ED Provider Notes (Signed)
[facility] Emergency Department Provider Note  ____________________________________________   First MD Initiated Contact with Patient 06/01/19 1254     (approximate)  I have reviewed the triage vital signs and the nursing notes.  History  Chief Complaint Abdominal Pain and GI Bleeding    HPI Anita Reed is a 58 y.o. female with a history of atrial flutter, alcoholic cirrhosis with ascites, history of GI bleed, portal hypertension who presents emergency department for GI bleeding.  Patient states over the last several days she has had greater than 10 episodes each of bloody emesis, as well as bloody stool.  She describes bright red bloody emesis, as well as both bloody and melanotic stools.  She does report she has been taking an excessive amount of ibuprofen over the last several days for generalized aches and pains.   Past Medical Hx Past Medical History:  Diagnosis Date  . Anemia   . Cancer ([place])   . Cirrhosis ([place])   . Hypertension     Problem List Patient Active Problem List   Diagnosis Date Noted  . History of lymphoma 02/26/2019  . Typical atrial flutter ([place]) 02/07/2019  . Alcoholic cirrhosis of liver with ascites ([place]) 02/07/2019  . Persistent atrial fibrillation ([place]) 02/07/2019  . Anemia 02/07/2019  . Anorexia 02/07/2019  . GI bleed 01/28/2019  . Ascites 01/18/2019  . Cancer [facility])     Past Surgical Hx Past Surgical History:  Procedure Laterality Date  . ABDOMINAL HYSTERECTOMY    . APPENDECTOMY    . ESOPHAGOGASTRODUODENOSCOPY N/A 01/29/2019   Procedure: ESOPHAGOGASTRODUODENOSCOPY (EGD);  Surgeon: [name], MD;  Location: [facility] ENDOSCOPY;  Service: Gastroenterology;  Laterality: N/A;  . TONSILLECTOMY      Medications Prior to Admission medications   Medication Sig Start Date End Date Taking? Authorizing Provider  furosemide (LASIX) 40 MG tablet Take 1 tablet (40 mg total) by mouth 2 (two) times daily. 01/31/19 05/31/19   Saundra Shelling, MD  nadolol (CORGARD) 20 MG tablet Take 1 tablet (20 mg total) by mouth daily. 04/23/19 07/22/19  [name], MD  pantoprazole (PROTONIX) 40 MG tablet Take 1 tablet (40 mg total) by mouth 2 (two) times daily. 01/31/19 01/31/20  Saundra Shelling, MD  Potassium Chloride ER 20 MEQ TBCR Take one tablet twice daily Patient not taking: Reported on 04/02/2019 02/26/19   Hubbard Hartshorn, FNP  spironolactone (ALDACTONE) 50 MG tablet Take 2 tablets (100 mg total) by mouth daily. 02/09/19 05/10/19  [name], MD  Thiamine HCl (VITAMIN B-1 PO) Take by mouth.    [provider]  tiZANidine (ZANAFLEX) 4 MG tablet Take 0.5-1 tablets (2-4 mg total) by mouth every 8 (eight) hours as needed for muscle spasms. Patient not taking: Reported on 04/23/2019 04/02/19   Hubbard Hartshorn, FNP    Allergies Patient has no known allergies.  Family Hx Family History  Problem Relation Age of Onset  . Cancer Mother   . Cancer Father     Social Hx Social History   Tobacco Use  . Smoking status: Current Every Day Smoker  . Smokeless tobacco: Never Used  Substance Use Topics  . Alcohol use: Not Currently  . Drug use: Never     Review of Systems  Constitutional: Negative for fever, chills. Eyes: Negative for visual changes. ENT: Negative for sore throat. Cardiovascular: Negative for chest pain. Respiratory: Negative for shortness of breath. Gastrointestinal: + bloody emesis and bloody stool Genitourinary: Negative for dysuria. Musculoskeletal: Negative for  leg swelling. Skin: Negative for rash. Neurological: Negative for for headaches.   Physical Exam  Vital Signs: ED Triage Vitals  Enc Vitals Group     BP 06/01/19 1231 (!) 161/83     Pulse Rate 06/01/19 1231 (!) 114     Resp 06/01/19 1231 20     Temp 06/01/19 1231 98.9 F (37.2 C)     Temp Source 06/01/19 1231 Oral     SpO2 06/01/19 1231 96 %     Weight 06/01/19 1232 135 lb (61.2 kg)     Height 06/01/19 1232 5\' 9"   (1.753 m)     Head Circumference --      Peak Flow --      Pain Score 06/01/19 1231 7     Pain Loc --      Pain Edu? --      Excl. in [place]? --     Constitutional: Alert and oriented.  Head: Normocephalic. Atraumatic. Eyes: Conjunctivae very pale. Sclera mildly icteric. Nose: No congestion. No rhinorrhea. Mouth/Throat: Mucous membranes are moist.  Neck: No stridor.   Cardiovascular: Tachycardic. Extremities well perfused. Respiratory: Normal respiratory effort.  Lungs CTAB. Gastrointestinal: Soft.  Mildly distended, consistent with ascites.  No focal tenderness. Musculoskeletal: No lower extremity edema. No deformities. Neurologic:  Normal speech and language. No gross focal neurologic deficits are appreciated.  Skin: Skin is pale. Psychiatric: Mood and affect are appropriate for situation.  EKG  Personally reviewed.   Rate: 116 Rhythm: atrial flutter Axis: normal Intervals: prolonged QTc ST/T wave changes in II, III, avF seen in prior No significant changes compared to prior No STEMI    Radiology  N/A   Procedures  Procedure(s) performed (including critical care):  .Critical Care Performed by: Lilia Pro., MD Authorized by: Lilia Pro., MD   Critical care provider statement:    Critical care time (minutes):  45   Critical care was necessary to treat or prevent imminent or life-threatening deterioration of the following conditions: GIB.   Critical care was time spent personally by me on the following activities:  Discussions with consultants, evaluation of patient's response to treatment, examination of patient, ordering and performing treatments and interventions, ordering and review of laboratory studies, ordering and review of radiographic studies, pulse oximetry, re-evaluation of patient's condition, obtaining history from patient or surrogate and review of old charts     Initial Impression / Assessment and Plan / ED Course  58 y.o. female who  presents to the ED for GIB. History of cirrhosis, portal hypertension, as above.  Will obtain labs, including coags, type and screen, IV access x 2, and initiate ceftriaxone, PPI drip, octreotide drip given her history.  She is tachycardic, but normotensive on arrival.  Repeat blood pressure 70s over 40s. Given her hypotension and tachycardia, will obtain  emergency release blood, and plan for transfusion, patient agreeable.  Hemoglobin is 5.1 from 10.1 prior.  Discussed with hospitalist for admission.   Final Clinical Impression(s) / ED Diagnosis  Final diagnoses:  Acute upper GI bleeding       Note:  This document was prepared using Dragon voice recognition software and may include unintentional dictation errors.   Lilia Pro., MD 06/01/19 1434

## 2019-06-01 NOTE — Anesthesia Procedure Notes (Signed)
Procedure Name: Intubation Date/Time: 06/01/2019 5:45 PM Performed by: Allean Found, CRNA Pre-anesthesia Checklist: Patient identified, Patient being monitored, Timeout performed, Emergency Drugs available and Suction available Patient Re-evaluated:Patient Re-evaluated prior to induction Oxygen Delivery Method: Circle system utilized Preoxygenation: Pre-oxygenation with 100% oxygen Induction Type: IV induction Ventilation: Mask ventilation without difficulty Laryngoscope Size: Mac and 3 Grade View: Grade I Tube type: Oral Tube size: 7.0 mm Number of attempts: 1 Airway Equipment and Method: Stylet Placement Confirmation: ETT inserted through vocal cords under direct vision,  positive ETCO2 and breath sounds checked- equal and bilateral Secured at: 21 cm Tube secured with: Tape Dental Injury: Teeth and Oropharynx as per pre-operative assessment

## 2019-06-01 NOTE — Anesthesia Preprocedure Evaluation (Signed)
Anesthesia Evaluation  Patient identified by MRN, date of birth, ID band Patient awake    Reviewed: Allergy & Precautions, NPO status , Patient's Chart, lab work & pertinent test results, reviewed documented beta blocker date and time   Airway Mallampati: III  TM Distance: >3 FB     Dental  (+) Chipped   Pulmonary Current Smoker,           Cardiovascular hypertension, Pt. on medications and Pt. on home beta blockers + dysrhythmias Atrial Fibrillation      Neuro/Psych    GI/Hepatic   Endo/Other    Renal/GU      Musculoskeletal   Abdominal   Peds  Hematology  (+) anemia ,   Anesthesia Other Findings Hb 5.0. 2 units given. Last echo on 5/20 showed EF of 55-60. ETOH.   Reproductive/Obstetrics                             Anesthesia Physical Anesthesia Plan  ASA: III  Anesthesia Plan: General   Post-op Pain Management:    Induction: Intravenous  PONV Risk Score and Plan:   Airway Management Planned: Oral ETT  Additional Equipment:   Intra-op Plan:   Post-operative Plan:   Informed Consent: I have reviewed the patients History and Physical, chart, labs and discussed the procedure including the risks, benefits and alternatives for the proposed anesthesia with the patient or authorized representative who has indicated his/her understanding and acceptance.       Plan Discussed with: CRNA  Anesthesia Plan Comments:         Anesthesia Quick Evaluation

## 2019-06-01 NOTE — Op Note (Signed)
[facility] Patient Name: Anita Reed Procedure Date: 06/01/2019 5:30 PM MRN: [id number] Account #: [id number] Date of Birth: 02-10-1961 Admit Type: Inpatient Age: 58 Room: [facility] ENDO ROOM 4 Gender: Female Note Status: Finalized Procedure:            Upper GI endoscopy Indications:          Suspected upper gastrointestinal bleeding, Esophageal                        varices, Portal hypertensive gastropathy, Follow up of                        hx of recent dieulafoy lesion bleed in June 2020 s/p                        endoscopic hemoclip placement. Providers:            Benay Pike. Alice Reichert MD, MD Referring MD:         No Local Md, MD (Referring MD) Medicines:            General Anesthesia Complications:        No immediate complications. Procedure:            Pre-Anesthesia Assessment:                       - The risks and benefits of the procedure and the                        sedation options and risks were discussed with the                        patient. All questions were answered and informed                        consent was obtained.                       - Patient identification and proposed procedure were                        verified prior to the procedure by the nurse. The                        procedure was verified in the procedure room.                       - ASA Grade Assessment: IV - A patient with severe                        systemic disease that is a constant threat to life.                       - After reviewing the risks and benefits, the patient                        was deemed in satisfactory condition to undergo the                        procedure.                       -  The anesthesia plan was to use general anesthesia.                       After obtaining informed consent, the endoscope was                        passed under direct vision. Throughout the procedure,                        the patient's blood  pressure, pulse, and oxygen                        saturations were monitored continuously. The Endoscope                        was introduced through the mouth, and advanced to the                        third part of duodenum. The upper GI endoscopy was                        accomplished without difficulty. The patient tolerated                        the procedure well. Findings:      Grade II, large (> 5 mm) varices were found in the middle third of the       esophagus and in the lower third of the esophagus.      There is no endoscopic evidence of red wale or cherry red spots,       adherent clots or other stigmata of bleeding in the middle third of the       esophagus and in the lower third of the esophagus.      Mild portal hypertensive gastropathy was found in the entire examined       stomach.      hemoclip was present cardia at site of previous dieulafoy lesion. No       stigmata of bleeding was noted.      There is no endoscopic evidence of bleeding, ulceration or angioectasia       in the entire examined stomach.      The examined duodenum was normal.      The exam was otherwise without abnormality.      A 1 cm hiatal hernia was present. Impression:           - Grade II and large (> 5 mm) esophageal varices.                       - Portal hypertensive gastropathy.                       - Normal examined duodenum.                       - The examination was otherwise normal.                       - No specimens collected. Recommendation:       - Return patient to ICU for ongoing care.                       -  NPO today.                       - Continue present medications.                       - Continue IV octreotide at 81mcg/hr infusion.                       - Elective colonoscopy when clinically feasible. Procedure Code(s):    --- Professional ---                       [phone], Esophagogastroduodenoscopy, flexible, transoral;                        diagnostic,  including collection of specimen(s) by                        brushing or washing, when performed (separate procedure) Diagnosis Code(s):    --- Professional ---                       K31.89, Other diseases of stomach and duodenum                       K76.6, Portal hypertension                       I85.00, Esophageal varices without bleeding CPT copyright 2019 [employer]. All rights reserved. The codes documented in this report are preliminary and upon coder review may  be revised to meet current compliance requirements. Efrain Sella MD, MD 06/01/2019 6:24:23 PM This report has been signed electronically. Number of Addenda: 0 Note Initiated On: 06/01/2019 5:30 PM Estimated Blood Loss: Estimated blood loss: none. Estimated blood loss: none.      [facility]

## 2019-06-01 NOTE — Interval H&P Note (Signed)
History and Physical Interval Note:  06/01/2019 5:41 PM  Anita Reed  has presented today for surgery, with the diagnosis of Acute GI Bleed, decompensated alcoholic cirrhosis, esophageal varices, epigastric pain, hematmesis, melena.  The various methods of treatment have been discussed with the patient and family. After consideration of risks, benefits and other options for treatment, the patient has consented to  Procedure(s): ESOPHAGOGASTRODUODENOSCOPY (EGD) (N/A) as a surgical intervention.  The patient's history has been reviewed, patient examined, no change in status, stable for surgery.  I have reviewed the patient's chart and labs.  Questions were answered to the patient's satisfaction.     [place], [place]

## 2019-06-02 LAB — CBC
HCT: 27.7 % — ABNORMAL LOW (ref 36.0–46.0)
Hemoglobin: 8.7 g/dL — ABNORMAL LOW (ref 12.0–15.0)
MCH: 26 pg (ref 26.0–34.0)
MCHC: 31.4 g/dL (ref 30.0–36.0)
MCV: 82.9 fL (ref 80.0–100.0)
Platelets: 246 10*3/uL (ref 150–400)
RBC: 3.34 MIL/uL — ABNORMAL LOW (ref 3.87–5.11)
RDW: 21.3 % — ABNORMAL HIGH (ref 11.5–15.5)
WBC: 19.5 10*3/uL — ABNORMAL HIGH (ref 4.0–10.5)
nRBC: 0 % (ref 0.0–0.2)

## 2019-06-02 LAB — MAGNESIUM: Magnesium: 1.8 mg/dL (ref 1.7–2.4)

## 2019-06-02 LAB — BASIC METABOLIC PANEL
Anion gap: 13 (ref 5–15)
BUN: 25 mg/dL — ABNORMAL HIGH (ref 6–20)
CO2: 18 mmol/L — ABNORMAL LOW (ref 22–32)
Calcium: 8.6 mg/dL — ABNORMAL LOW (ref 8.9–10.3)
Chloride: 107 mmol/L (ref 98–111)
Creatinine, Ser: 0.69 mg/dL (ref 0.44–1.00)
GFR calc Af Amer: >60 mL/min (ref >60)
GFR calc non Af Amer: >60 mL/min (ref >60)
Glucose, Bld: 139 mg/dL — ABNORMAL HIGH (ref 70–99)
Potassium: 4.6 mmol/L (ref 3.5–5.1)
Sodium: 138 mmol/L (ref 135–145)

## 2019-06-02 LAB — HEMOGLOBIN
Hemoglobin: 8.6 g/dL — ABNORMAL LOW (ref 12.0–15.0)
Hemoglobin: 8.5 g/dL — ABNORMAL LOW (ref 12.0–15.0)

## 2019-06-02 LAB — HEPATIC FUNCTION PANEL
ALT: 25 U/L (ref 0–44)
AST: 66 U/L — ABNORMAL HIGH (ref 15–41)
Albumin: 3.6 g/dL (ref 3.5–5.0)
Alkaline Phosphatase: 262 U/L — ABNORMAL HIGH (ref 38–126)
Bilirubin, Direct: 1 mg/dL — ABNORMAL HIGH (ref 0.0–0.2)
Indirect Bilirubin: 1.8 mg/dL — ABNORMAL HIGH (ref 0.3–0.9)
Total Bilirubin: 2.8 mg/dL — ABNORMAL HIGH (ref 0.3–1.2)
Total Protein: 6 g/dL — ABNORMAL LOW (ref 6.5–8.1)

## 2019-06-02 LAB — FOLATE: Folate: 20.7 ng/mL (ref >5.9)

## 2019-06-02 LAB — VITAMIN B12: Vitamin B-12: 749 pg/mL (ref 180–914)

## 2019-06-02 LAB — FERRITIN: Ferritin: 13 ng/mL (ref 11–307)

## 2019-06-02 LAB — IRON AND TIBC
Iron: 40 ug/dL (ref 28–170)
Saturation Ratios: 8 % — ABNORMAL LOW (ref 10.4–31.8)
TIBC: 500 ug/dL — ABNORMAL HIGH (ref 250–450)
UIBC: 460 ug/dL

## 2019-06-02 MED ORDER — BISACODYL 5 MG PO TBEC
10.0000 mg | DELAYED_RELEASE_TABLET | Freq: Once | ORAL | Status: AC
  Administered 2019-06-02: 10 mg via ORAL
  Filled 2019-06-02: qty 2

## 2019-06-02 MED ORDER — PEG 3350-KCL-NA BICARB-NACL 420 G PO SOLR
4000.0000 mL | Freq: Once | ORAL | Status: AC
  Administered 2019-06-02: 4000 mL via ORAL
  Filled 2019-06-02: qty 4000

## 2019-06-02 MED ORDER — METOPROLOL TARTRATE 5 MG/5ML IV SOLN
5.0000 mg | Freq: Four times a day (QID) | INTRAVENOUS | Status: DC | PRN
  Administered 2019-06-02 – 2019-06-04 (×7): 5 mg via INTRAVENOUS
  Filled 2019-06-02 (×7): qty 5

## 2019-06-02 MED ORDER — MORPHINE SULFATE (PF) 2 MG/ML IV SOLN
2.0000 mg | INTRAVENOUS | Status: DC | PRN
  Administered 2019-06-02 – 2019-06-10 (×19): 2 mg via INTRAVENOUS
  Filled 2019-06-02 (×21): qty 1

## 2019-06-02 MED ORDER — FLUCONAZOLE 50 MG PO TABS
150.0000 mg | ORAL_TABLET | Freq: Once | ORAL | Status: AC
  Administered 2019-06-02: 16:00:00 150 mg via ORAL
  Filled 2019-06-02: qty 1

## 2019-06-02 NOTE — Progress Notes (Signed)
Spoke with dr. Bridgett Larsson to make aware patients heart rate was ranging 110's up to 120. Iv metoprolol 5mg  was given, also had to be given my night shift. Currently no scheduled meds order for the patient. md on the floor, will continue to monitor

## 2019-06-02 NOTE — Progress Notes (Signed)
Heart rate ranging 90-110's. Patient has prn metoprolol. Will given on continue to monitor closely

## 2019-06-02 NOTE — Progress Notes (Signed)
Per dr. Alice Reichert go ahead and give ducolax now, started golytely prep around 5pm. Patient may have clears, plan for colonscopy in am

## 2019-06-02 NOTE — Plan of Care (Signed)
  Problem: Education: Goal: Knowledge of General Education information will improve Description: Including pain rating scale, medication(s)/side effects and non-pharmacologic comfort measures Outcome: Progressing   Problem: Pain Managment: Goal: General experience of comfort will improve Outcome: Progressing   Problem: Safety: Goal: Ability to remain free from injury will improve Outcome: Progressing   

## 2019-06-02 NOTE — Progress Notes (Signed)
[facility] [place] NAME: Anita Reed    MR#:  [id number]  DATE OF BIRTH:  July 23, 1961  SUBJECTIVE:  CHIEF COMPLAINT:   Chief Complaint  Patient presents with  . Abdominal Pain  . GI Bleeding   The patient complains of abdominal pain and generalized weakness.  No melena or bloody stool. REVIEW OF SYSTEMS:  Review of Systems  Constitutional: Positive for malaise/fatigue. Negative for chills and fever.  HENT: Negative for sore throat.   Eyes: Negative for blurred vision and double vision.  Respiratory: Negative for cough, hemoptysis, shortness of breath, wheezing and stridor.   Cardiovascular: Negative for chest pain, palpitations, orthopnea and leg swelling.  Gastrointestinal: Positive for abdominal pain. Negative for blood in stool, diarrhea, melena, nausea and vomiting.  Genitourinary: Negative for dysuria, flank pain and hematuria.  Musculoskeletal: Negative for back pain and joint pain.  Skin: Negative for rash.  Neurological: Negative for dizziness, sensory change, focal weakness, seizures, loss of consciousness, weakness and headaches.  Endo/Heme/Allergies: Negative for polydipsia.  Psychiatric/Behavioral: Negative for depression. The patient is not nervous/anxious.     DRUG ALLERGIES:  No Known Allergies VITALS:  Blood pressure 130/82, pulse (!) 106, temperature (!) 97.5 F (36.4 C), temperature source Oral, resp. rate 19, height 5\' 8"  (1.727 m), weight 67.6 kg, SpO2 92 %. PHYSICAL EXAMINATION:  Physical Exam Constitutional:      General: She is not in acute distress. HENT:     Head: Normocephalic.     Mouth/Throat:     Mouth: Mucous membranes are moist.  Eyes:     General: No scleral icterus.    Conjunctiva/sclera: Conjunctivae normal.     Pupils: Pupils are equal, round, and reactive to light.  Neck:     Musculoskeletal: Normal range of motion and neck supple.     Vascular: No JVD.     Trachea: No tracheal  deviation.  Cardiovascular:     Rate and Rhythm: Normal rate and regular rhythm.     Heart sounds: Normal heart sounds. No murmur. No gallop.   Pulmonary:     Effort: Pulmonary effort is normal. No respiratory distress.     Breath sounds: Normal breath sounds. No wheezing or rales.  Abdominal:     General: Bowel sounds are normal. There is no distension.     Palpations: Abdomen is soft.     Tenderness: There is abdominal tenderness. There is no rebound.  Musculoskeletal: Normal range of motion.        General: No tenderness.     Right lower leg: No edema.     Left lower leg: No edema.  Skin:    Findings: No erythema or rash.  Neurological:     General: No focal deficit present.     Mental Status: She is alert and oriented to person, place, and time.     Cranial Nerves: No cranial nerve deficit.  Psychiatric:        Mood and Affect: Mood normal.    LABORATORY PANEL:  Female CBC Recent Labs  Lab 06/02/19 0534 06/02/19 1108  WBC 19.5*  --   HGB 8.7* 8.6*  HCT 27.7*  --   PLT 246  --    ------------------------------------------------------------------------------------------------------------------ Chemistries  Recent Labs  Lab 06/02/19 0534  NA 138  K 4.6  CL 107  CO2 18*  GLUCOSE 139*  BUN 25*  CREATININE 0.69  CALCIUM 8.6*  AST 66*  ALT 25  ALKPHOS 262*  BILITOT 2.8*   RADIOLOGY:  No results found. ASSESSMENT AND PLAN:   58 year old female with liver cirrhosis and portal hypertension who presents to the emergency room due to melena and hematemesis.  1.  Upper GI bleed from underlying liver cirrhosis/portal hypertension in the setting of using NSAIDs. Continue PPI and octreotide S/p EGD: Grade II and large (> 5 mm) esophageal varices. Portal hypertensive gastropathy. Elective colonoscopy when clinically feasible per Dr. Alice Reichert. Started clear liquid diet and colonoscopy later today per GI.  2.  Acute on chronic blood loss anemia due to problem #1:   S/p 2 unit PRBC transfusion.  Hemoglobin up to 8.6.  Follow-up hemoglobin.  3.  Portal hypertension due to liver cirrhosis from chronic EtOH: Hold all medications including spironolactone, nadolol and Lasix for now due to low blood pressure and GI blood loss.  4.  EtOH abuse: Patient has not had any alcohol for several months  5.    Tachycardia due to above: Continue telemetry monitoring Tobacco abuse.  Smoking cessation was counseled for 3 to 4 minutes.  The patient refused nicotine patch. All the records are reviewed and case discussed with Care Management/Social Worker. Management plans discussed with the patient, family and they are in agreement.  CODE STATUS: Full Code  TOTAL TIME TAKING CARE OF THIS PATIENT: 36 minutes.   More than 50% of the time was spent in counseling/coordination of care: YES  POSSIBLE D/C IN 3 DAYS, DEPENDING ON CLINICAL CONDITION.   Demetrios Loll M.D on 06/02/2019 at 12:07 PM  Between 7am to 6pm - Pager - [phone]  After 6pm go to [email or web address] - [facility]

## 2019-06-02 NOTE — Progress Notes (Signed)
Patients heart rate 115. Up to restroom heart rate increased to 120's. Iv metoprolol 5mg  iv given current heart rate 110. Will continue to monitor closely

## 2019-06-02 NOTE — Progress Notes (Signed)
Heart rate 118. 5mg  iv metoprolol given slow, current heart rate 112. Will continue to monitor closely

## 2019-06-03 ENCOUNTER — Inpatient Hospital Stay: Payer: Medicare Other | Admitting: Anesthesiology

## 2019-06-03 ENCOUNTER — Inpatient Hospital Stay: Payer: Medicare Other

## 2019-06-03 ENCOUNTER — Encounter
Admission: EM | Disposition: A | Discharge status: Home / Self Care | Payer: Self-pay | Source: Home / Self Care | Attending: Internal Medicine

## 2019-06-03 ENCOUNTER — Encounter: Payer: Self-pay | Admitting: *Deleted

## 2019-06-03 HISTORY — PX: COLONOSCOPY: SHX5424

## 2019-06-03 LAB — COMPREHENSIVE METABOLIC PANEL
ALT: 28 U/L (ref 0–44)
AST: 58 U/L — ABNORMAL HIGH (ref 15–41)
Albumin: 3.7 g/dL (ref 3.5–5.0)
Alkaline Phosphatase: 232 U/L — ABNORMAL HIGH (ref 38–126)
Anion gap: 8 (ref 5–15)
BUN: 22 mg/dL — ABNORMAL HIGH (ref 6–20)
CO2: 22 mmol/L (ref 22–32)
Calcium: 8.4 mg/dL — ABNORMAL LOW (ref 8.9–10.3)
Chloride: 106 mmol/L (ref 98–111)
Creatinine, Ser: 0.57 mg/dL (ref 0.44–1.00)
GFR calc Af Amer: >60 mL/min (ref >60)
GFR calc non Af Amer: >60 mL/min (ref >60)
Glucose, Bld: 127 mg/dL — ABNORMAL HIGH (ref 70–99)
Potassium: 3.6 mmol/L (ref 3.5–5.1)
Sodium: 136 mmol/L (ref 135–145)
Total Bilirubin: 2.6 mg/dL — ABNORMAL HIGH (ref 0.3–1.2)
Total Protein: 6 g/dL — ABNORMAL LOW (ref 6.5–8.1)

## 2019-06-03 LAB — CBC
HCT: 24.2 % — ABNORMAL LOW (ref 36.0–46.0)
Hemoglobin: 7.9 g/dL — ABNORMAL LOW (ref 12.0–15.0)
MCH: 26.4 pg (ref 26.0–34.0)
MCHC: 32.6 g/dL (ref 30.0–36.0)
MCV: 80.9 fL (ref 80.0–100.0)
Platelets: 251 10*3/uL (ref 150–400)
RBC: 2.99 MIL/uL — ABNORMAL LOW (ref 3.87–5.11)
RDW: 21.6 % — ABNORMAL HIGH (ref 11.5–15.5)
WBC: 27.3 10*3/uL — ABNORMAL HIGH (ref 4.0–10.5)
nRBC: 0.1 % (ref 0.0–0.2)

## 2019-06-03 LAB — PREPARE RBC (CROSSMATCH)

## 2019-06-03 IMAGING — DX DG CHEST 1V PORT
1 series · 1 of 1 positions shown · non-contrast
Comparison: [DATE]

CLINICAL DATA: Leukocytosis.  Colonoscopy this morning.  GI bleed.

EXAM:
PORTABLE CHEST 1 VIEW

[chest ap]
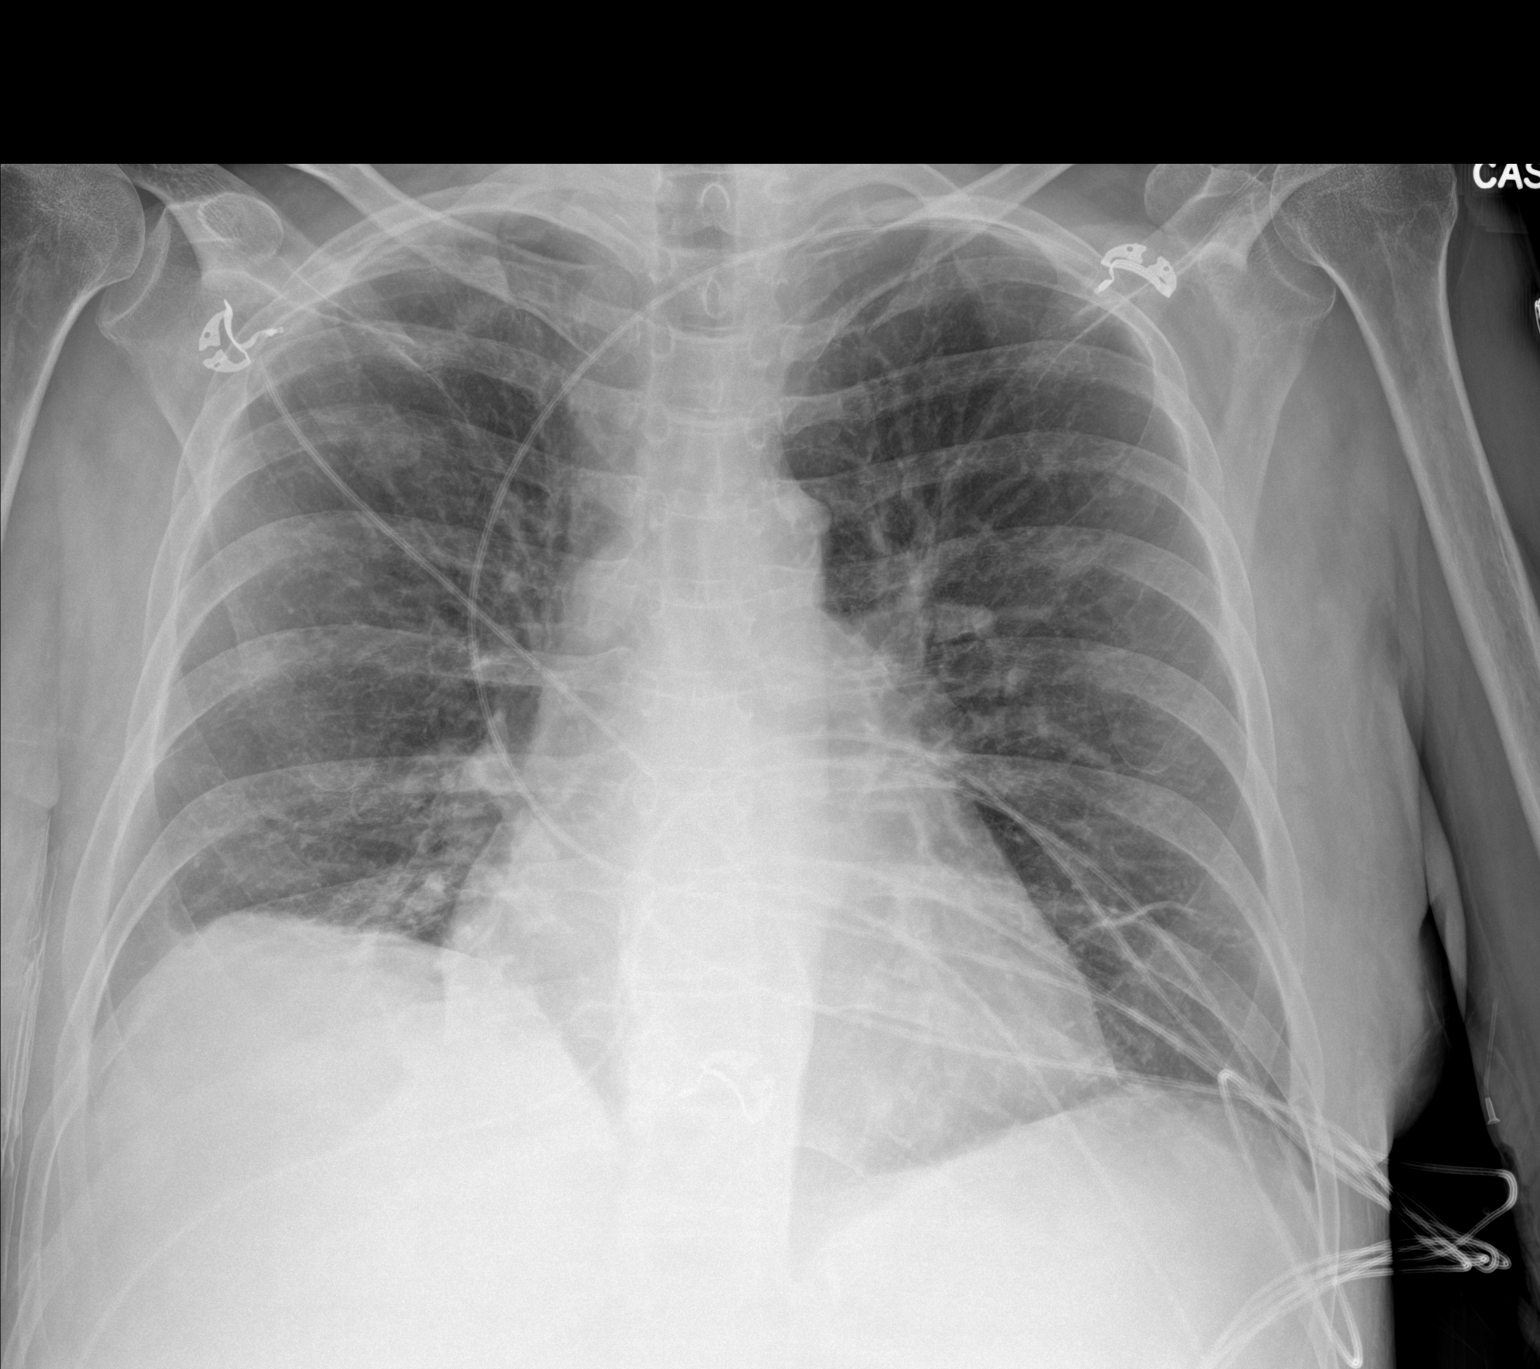

[1 of 1 positions shown; findings below may reference images not displayed]

FINDINGS: Lungs are adequately inflated without lobar consolidation or
effusion. Cardiomediastinal silhouette and remainder of the exam is
unchanged.
IMPRESSION: No acute cardiopulmonary disease.

## 2019-06-03 SURGERY — COLONOSCOPY
Anesthesia: General

## 2019-06-03 MED ORDER — LINACLOTIDE 145 MCG PO CAPS
145.0000 ug | ORAL_CAPSULE | Freq: Every day | ORAL | Status: DC | PRN
  Administered 2019-06-04: 145 ug via ORAL
  Filled 2019-06-03 (×2): qty 1

## 2019-06-03 MED ORDER — PROPOFOL 500 MG/50ML IV EMUL
INTRAVENOUS | Status: DC | PRN
  Administered 2019-06-03: 120 ug/kg/min via INTRAVENOUS

## 2019-06-03 MED ORDER — PHENYLEPHRINE HCL (PRESSORS) 10 MG/ML IV SOLN
INTRAVENOUS | Status: DC | PRN
  Administered 2019-06-03: 100 ug via INTRAVENOUS
  Administered 2019-06-03: 200 ug via INTRAVENOUS

## 2019-06-03 MED ORDER — SODIUM CHLORIDE 0.9 % IV SOLN
2.0000 g | INTRAVENOUS | Status: DC
  Administered 2019-06-03: 2 g via INTRAVENOUS
  Filled 2019-06-03: qty 2

## 2019-06-03 MED ORDER — PROPOFOL 10 MG/ML IV BOLUS
INTRAVENOUS | Status: AC
  Filled 2019-06-03: qty 20

## 2019-06-03 MED ORDER — LIDOCAINE 2% (20 MG/ML) 5 ML SYRINGE
INTRAMUSCULAR | Status: DC | PRN
  Administered 2019-06-03: 25 mg via INTRAVENOUS

## 2019-06-03 MED ORDER — SODIUM CHLORIDE 0.9 % IV SOLN: 1.0000 g | INTRAVENOUS | Status: DC

## 2019-06-03 MED ORDER — LIDOCAINE HCL (PF) 2 % IJ SOLN
INTRAMUSCULAR | Status: AC
  Filled 2019-06-03: qty 10

## 2019-06-03 MED ORDER — MIDAZOLAM HCL 2 MG/2ML IJ SOLN
INTRAMUSCULAR | Status: AC
  Filled 2019-06-03: qty 2

## 2019-06-03 MED ORDER — MIDAZOLAM HCL 5 MG/5ML IJ SOLN
INTRAMUSCULAR | Status: DC | PRN
  Administered 2019-06-03: 2 mg via INTRAVENOUS

## 2019-06-03 MED ORDER — PROPOFOL 10 MG/ML IV BOLUS
INTRAVENOUS | Status: DC | PRN
  Administered 2019-06-03: 70 mg via INTRAVENOUS
  Administered 2019-06-03: 50 mg via INTRAVENOUS

## 2019-06-03 MED ORDER — SODIUM CHLORIDE 0.9 % IV SOLN: INTRAVENOUS | Status: DC

## 2019-06-03 NOTE — Op Note (Signed)
[facility] Gastroenterology Patient Name: Anita Reed Procedure Date: 06/03/2019 8:27 AM MRN: [id number] Account #: [id number] Date of Birth: [birth year] Admit Type: Inpatient Age: 58 Room: [facility] ENDO ROOM 4 Gender: Female Note Status: Finalized Procedure:            Colonoscopy Indications:          Hematochezia, Acute post hemorrhagic anemia Providers:            Benay Pike. Alice Reichert MD, MD Referring MD:         No Local Md, MD (Referring MD) Medicines:            Propofol per Anesthesia Complications:        No immediate complications. Procedure:            Pre-Anesthesia Assessment:                       - Prior to the procedure, a History and Physical was                        performed, and patient medications, allergies and                        sensitivities were reviewed. The patient's tolerance of                        previous anesthesia was reviewed.                       - The risks and benefits of the procedure and the                        sedation options and risks were discussed with the                        patient. All questions were answered and informed                        consent was obtained.                       - Patient identification and proposed procedure were                        verified prior to the procedure by the nurse. The                        procedure was verified in the procedure room.                       - ASA Grade Assessment: IV - A patient with severe                        systemic disease that is a constant threat to life.                       - After reviewing the risks and benefits, the patient                        was deemed in satisfactory condition to undergo the  procedure.                       After obtaining informed consent, the colonoscope was                        passed under direct vision. Throughout the procedure,                        the patient's blood pressure,  pulse, and oxygen                        saturations were monitored continuously. The                        Colonoscope was introduced through the anus and                        advanced to the the terminal ileum, with identification                        of the appendiceal orifice and IC valve. The                        colonoscopy was performed without difficulty. The                        patient tolerated the procedure well. The quality of                        the bowel preparation was adequate. The terminal ileum,                        ileocecal valve, appendiceal orifice, and rectum were                        photographed. Findings:      The perianal and digital rectal examinations were normal. Pertinent       negatives include normal sphincter tone and no palpable rectal lesions.      The terminal ileum appeared normal.      A segmental pseudomembrane was found in the ascending colon and in the       cecum. Two biopsies were obtained with cold large-capacity forceps for       histology in the cecum, as well as four biopsies in the ascending colon.       Estimated blood loss was minimal.      Red blood was found in the entire colon.      A localized area of mildly erythematous mucosa was found in the rectum       and in the distal sigmoid colon. Biopsies were taken with a cold forceps       for histology.      Non-bleeding internal hemorrhoids were found during retroflexion. The       hemorrhoids were Grade I (internal hemorrhoids that do not prolapse).      There is no endoscopic evidence of diverticula, mass, polyps or       ulcerations in the entire colon.      The exam was otherwise without abnormality. Impression:           - The examined portion of the  ileum was normal.                       - Pseudomembranous enterocolitis.                       - Blood in the entire examined colon.                       - Erythematous mucosa in the rectum and in the distal                         sigmoid colon. Biopsied.                       - Non-bleeding internal hemorrhoids.                       - The examination was otherwise normal.                       - Biopsies performed in the cecum and in the ascending                        colon. Recommendation:       - Return patient to hospital ward for ongoing care.                       - Clear liquid diet.                       - Continue present medications.                       - Discontinue octreotide, place patient in contact                        isolation.                       - Await pathology results.                       Roslyn Smiling path diagnosis has been requested.                       - Continue IV fluids                       - GI continuing to follow closely.                       - Check C. Dif.stool assay if not checked Procedure Code(s):    --- Professional ---                       [phone], Colonoscopy, flexible; with biopsy, single or                        multiple Diagnosis Code(s):    --- Professional ---                       D62, Acute posthemorrhagic anemia  K92.1, Melena (includes Hematochezia)                       K63.89, Other specified diseases of intestine                       K62.89, Other specified diseases of anus and rectum                       K92.2, Gastrointestinal hemorrhage, unspecified                       A04.72, Enterocolitis due to Clostridium difficile, not                        specified as recurrent                       K64.0, First degree hemorrhoids CPT copyright 2019 American Medical Association. All rights reserved. The codes documented in this report are preliminary and upon coder review may  be revised to meet current compliance requirements. Efrain Sella MD, MD 06/03/2019 11:33:33 AM This report has been signed electronically. Number of Addenda: 0 Note Initiated On: 06/03/2019 8:27 AM Scope Withdrawal Time: 0 hours 15 minutes  0 seconds  Total Procedure Duration: 0 hours 20 minutes 30 seconds  Estimated Blood Loss: Estimated blood loss: 35 mL. Self limited bleeding      [facility]

## 2019-06-03 NOTE — Anesthesia Postprocedure Evaluation (Signed)
Anesthesia Post Note  Patient: Anita Reed  Procedure(s) Performed: COLONOSCOPY (N/A )  Patient location during evaluation: Endoscopy Anesthesia Type: General Level of consciousness: awake and alert Pain management: pain level controlled Vital Signs Assessment: post-procedure vital signs reviewed and stable Respiratory status: spontaneous breathing, nonlabored ventilation, respiratory function stable and patient connected to nasal cannula oxygen Cardiovascular status: blood pressure returned to baseline and stable Postop Assessment: no apparent nausea or vomiting Anesthetic complications: no     Last Vitals:  Vitals:   06/03/19 1529 06/03/19 2005  BP: 116/74 125/71  Pulse: (!) 104 (!) 111  Resp: 19 20  Temp: 36.8 C 36.7 C  SpO2: 92% 97%    Last Pain:  Vitals:   06/03/19 2113  TempSrc:   PainSc: 8                  Martha Clan

## 2019-06-03 NOTE — Anesthesia Post-op Follow-up Note (Signed)
Anesthesia QCDR form completed.        

## 2019-06-03 NOTE — Transfer of Care (Signed)
Immediate Anesthesia Transfer of Care Note  Patient: Anita Reed  Procedure(s) Performed: COLONOSCOPY (N/A )  Patient Location: Endoscopy Unit  Anesthesia Type:General  Level of Consciousness: sedated  Airway & Oxygen Therapy: Patient Spontanous Breathing and Patient connected to nasal cannula oxygen  Post-op Assessment: Report given to RN and Post -op Vital signs reviewed and stable  Post vital signs: Reviewed  Last Vitals:  Vitals Value Taken Time  BP 94/64 06/03/19 1122  Temp    Pulse 115 06/03/19 1122  Resp 18 06/03/19 1122  SpO2 96 % 06/03/19 1122    Last Pain:  Vitals:   06/03/19 0935  TempSrc: Tympanic  PainSc:       Patients Stated Pain Goal: 0 ([phone] [phone])  Complications: No apparent anesthesia complications

## 2019-06-03 NOTE — Progress Notes (Signed)
GI service quick note:   Colonoscopy completed (see procedure note) with findings suspicious for Clostridium dificile colitis. Biopsies obtained. Also possibly ischemic colitis in left colon. No diverticula noted. Small incidental hemorrhoids, but otherwise negative.   Plan to place patient in contact isolation, get stool for C dif and other pathogens, discontinue octreotide and pantoprazole.    Continuing to follow closely.   Robet Leu, M.D. AB[facility] Diplomate in Gastroenterology [place]

## 2019-06-03 NOTE — Progress Notes (Addendum)
Pt just have a small amount of black liquid tarry and taking Golytely for pt colonoscopy this morning. Notify Dr. Jannifer Franklin. Will continue to monitor.  Update 0330: pt had a total of 4 black liquid tarry stool with colonoscopy prep. Notify prime. Will continue to monitor.  Update 0455: Pt still have atleast 50 ml left from pt Golytely colonoscopy prep and " states I can no longer finished it or I will vomit".Pt was educated about the importance of finishing prep. Page prime. Will continue to monitor.  Update 0540: Talked to Dr. Marcille Blanco about the incomplete prep and states notify incoming shift. Will continue to monitor.  Update 0640: Tap water enema administration on pt was completed. Pt did have a clear stool sample after completion of enema. Will notify incoming shift. Will continue to monitor.

## 2019-06-03 NOTE — Progress Notes (Addendum)
[facility] at [place] NAME: Anita Reed    MR#:  [id number]  DATE OF BIRTH:  October 29, 1960  SUBJECTIVE:  CHIEF COMPLAINT:   Chief Complaint  Patient presents with  . Abdominal Pain  . GI Bleeding   The patient complains of melena during preparation for colonoscopy. REVIEW OF SYSTEMS:  Review of Systems  Constitutional: Positive for malaise/fatigue. Negative for chills and fever.  HENT: Negative for sore throat.   Eyes: Negative for blurred vision and double vision.  Respiratory: Negative for cough, hemoptysis, shortness of breath, wheezing and stridor.   Cardiovascular: Negative for chest pain, palpitations, orthopnea and leg swelling.  Gastrointestinal: Positive for melena. Negative for abdominal pain, blood in stool, diarrhea, nausea and vomiting.  Genitourinary: Negative for dysuria, flank pain and hematuria.  Musculoskeletal: Negative for back pain and joint pain.  Skin: Negative for rash.  Neurological: Negative for dizziness, sensory change, focal weakness, seizures, loss of consciousness, weakness and headaches.  Endo/Heme/Allergies: Negative for polydipsia.  Psychiatric/Behavioral: Negative for depression. The patient is not nervous/anxious.     DRUG ALLERGIES:  No Known Allergies VITALS:  Blood pressure 111/70, pulse (!) 108, temperature 98.1 F (36.7 C), temperature source Oral, resp. rate 18, height 5\' 8"  (1.727 m), weight 70.5 kg, SpO2 92 %. PHYSICAL EXAMINATION:  Physical Exam Constitutional:      General: She is not in acute distress. HENT:     Head: Normocephalic.     Mouth/Throat:     Mouth: Mucous membranes are moist.  Eyes:     General: No scleral icterus.    Conjunctiva/sclera: Conjunctivae normal.     Pupils: Pupils are equal, round, and reactive to light.  Neck:     Musculoskeletal: Normal range of motion and neck supple.     Vascular: No JVD.     Trachea: No tracheal deviation.  Cardiovascular:   Rate and Rhythm: Normal rate and regular rhythm.     Heart sounds: Normal heart sounds. No murmur. No gallop.   Pulmonary:     Effort: Pulmonary effort is normal. No respiratory distress.     Breath sounds: Normal breath sounds. No wheezing or rales.  Abdominal:     General: Bowel sounds are normal. There is no distension.     Palpations: Abdomen is soft.     Tenderness: There is no abdominal tenderness. There is no rebound.  Musculoskeletal: Normal range of motion.        General: No tenderness.     Right lower leg: No edema.     Left lower leg: No edema.  Skin:    Findings: No erythema or rash.  Neurological:     General: No focal deficit present.     Mental Status: She is alert and oriented to person, place, and time.     Cranial Nerves: No cranial nerve deficit.  Psychiatric:        Mood and Affect: Mood normal.    LABORATORY PANEL:  Female CBC Recent Labs  Lab 06/03/19 0651  WBC 27.3*  HGB 7.9*  HCT 24.2*  PLT 251   ------------------------------------------------------------------------------------------------------------------ Chemistries  Recent Labs  Lab 06/02/19 1100 06/03/19 0651  NA  --  136  K  --  3.6  CL  --  106  CO2  --  22  GLUCOSE  --  127*  BUN  --  22*  CREATININE  --  0.57  CALCIUM  --  8.4*  MG 1.8  --  AST  --  58*  ALT  --  28  ALKPHOS  --  232*  BILITOT  --  2.6*   RADIOLOGY:  No results found. ASSESSMENT AND PLAN:   58 year old female with liver cirrhosis and portal hypertension who presents to the emergency room due to melena and hematemesis.  1.  Upper GI bleed from underlying liver cirrhosis/portal hypertension in the setting of using NSAIDs. The patient has been treated with PPI and octreotide S/p EGD: Grade II and large (> 5 mm) esophageal varices. Portal hypertensive gastropathy. Elective colonoscopy: Pseudomembranous enterocolitis, Blood in the entire examined colon and Non-bleeding internal hemorrhoids per Dr. Alice Reichert.  Discontinue octreotide drip per Dr. Alice Reichert.  2.  Acute on chronic blood loss anemia due to problem #1:  S/p 2 unit PRBC transfusion.  Hemoglobin 7.9.  Follow-up hemoglobin.  3.  Portal hypertension due to liver cirrhosis from chronic EtOH: Hold all medications including spironolactone, nadolol and Lasix for now due to low blood pressure and GI blood loss.  4.  EtOH abuse: Patient has not had any alcohol for several months  5.    Tachycardia due to above: Continue telemetry monitoring.  IV fluid support.  Leukocytosis.  Unclear etiology.  Follow-up CBC, chest x-ray, urinalysis and abdominal ultrasound to rule out ascites.  Tobacco abuse.  Smoking cessation was counseled for 3 to 4 minutes.  The patient refused nicotine patch. All the records are reviewed and case discussed with Care Management/Social Worker. Management plans discussed with the patient, family and they are in agreement.  CODE STATUS: Full Code  TOTAL TIME TAKING CARE OF THIS PATIENT: 27 minutes.   More than 50% of the time was spent in counseling/coordination of care: YES  POSSIBLE D/C IN 3 DAYS, DEPENDING ON CLINICAL CONDITION.   Demetrios Loll M.D on 06/03/2019 at 12:56 PM  Between 7am to 6pm - Pager - [phone]  After 6pm go to [email or web address] - [facility]

## 2019-06-03 NOTE — Progress Notes (Signed)
Report given to endo RN. Transport on the the floor to take patient for colonoscopy.

## 2019-06-03 NOTE — Progress Notes (Signed)
Dr. Bridgett Larsson on the floor. Made aware patients heart rate is maintaining 120. Prn iv metoprolol was given around 0530 have no real improvement. Per md increased ns to 158ml/hr. No other orders. Will continue to monitor closely

## 2019-06-03 NOTE — Progress Notes (Signed)
Spoke to Dr. Alice Reichert earlier this evening; per his order, Ceftriaxone D'c'd. Pt also ordered a 2 gm sodium diet.

## 2019-06-03 NOTE — Progress Notes (Signed)
Pt refused bed alarm but was educated about safety.. Will continue to monitor. 

## 2019-06-03 NOTE — Interval H&P Note (Signed)
History and Physical Interval Note:  06/03/2019 10:54 AM  Anita Reed  has presented today for surgery, with the diagnosis of melena, hematochezia, anemia secondary to gastrointestinal hemorrhage.  The various methods of treatment have been discussed with the patient and family. After consideration of risks, benefits and other options for treatment, the patient has consented to  Procedure(s): COLONOSCOPY (N/A) as a surgical intervention.  The patient's history has been reviewed, patient examined, no change in status, stable for surgery.  I have reviewed the patient's chart and labs.  Questions were answered to the patient's satisfaction.     [place], [place]

## 2019-06-03 NOTE — Anesthesia Preprocedure Evaluation (Signed)
Anesthesia Evaluation  Patient identified by MRN, date of birth, ID band Patient awake    Reviewed: Allergy & Precautions, NPO status , Patient's Chart, lab work & pertinent test results  History of Anesthesia Complications Negative for: history of anesthetic complications  Airway Mallampati: II  TM Distance: >3 FB Neck ROM: Full    Dental  (+) Poor Dentition   Pulmonary neg shortness of breath, neg sleep apnea, neg COPD, neg recent URI, Current Smoker and Patient abstained from smoking.,    breath sounds clear to auscultation- rhonchi (-) wheezing      Cardiovascular hypertension, Pt. on medications (-) angina(-) CAD, (-) Past MI, (-) Cardiac Stents and (-) CABG  Rhythm:Regular Rate:Normal - Systolic murmurs and - Diastolic murmurs    Neuro/Psych neg Seizures negative neurological ROS  negative psych ROS   GI/Hepatic (+) Cirrhosis       , GIB    Endo/Other  negative endocrine ROSneg diabetes  Renal/GU negative Renal ROS     Musculoskeletal negative musculoskeletal ROS (+)   Abdominal (+) - obese,   Peds  Hematology  (+) Blood dyscrasia, anemia ,   Anesthesia Other Findings Past Medical History: No date: Cancer ([place]) No date: Hypertension   Reproductive/Obstetrics                             Anesthesia Physical  Anesthesia Plan  ASA: III  Anesthesia Plan: General   Post-op Pain Management:    Induction: Intravenous  PONV Risk Score and Plan: 1 and Propofol infusion and TIVA  Airway Management Planned: Natural Airway and Nasal Cannula  Additional Equipment:   Intra-op Plan:   Post-operative Plan:   Informed Consent: I have reviewed the patients History and Physical, chart, labs and discussed the procedure including the risks, benefits and alternatives for the proposed anesthesia with the patient or authorized representative who has indicated his/her understanding and  acceptance.     Dental advisory given  Plan Discussed with: CRNA and Anesthesiologist  Anesthesia Plan Comments:         Anesthesia Quick Evaluation

## 2019-06-04 ENCOUNTER — Inpatient Hospital Stay: Payer: Medicare Other

## 2019-06-04 ENCOUNTER — Encounter: Payer: Self-pay | Admitting: Internal Medicine

## 2019-06-04 LAB — CBC
HCT: 20.5 % — ABNORMAL LOW (ref 36.0–46.0)
Hemoglobin: 6.3 g/dL — ABNORMAL LOW (ref 12.0–15.0)
MCH: 25.6 pg — ABNORMAL LOW (ref 26.0–34.0)
MCHC: 30.7 g/dL (ref 30.0–36.0)
MCV: 83.3 fL (ref 80.0–100.0)
Platelets: 201 10*3/uL (ref 150–400)
RBC: 2.46 MIL/uL — ABNORMAL LOW (ref 3.87–5.11)
RDW: 22.2 % — ABNORMAL HIGH (ref 11.5–15.5)
WBC: 15 10*3/uL — ABNORMAL HIGH (ref 4.0–10.5)
nRBC: 0.4 % — ABNORMAL HIGH (ref 0.0–0.2)

## 2019-06-04 LAB — BODY FLUID CELL COUNT WITH DIFFERENTIAL
Eos, Fluid: 0 %
Lymphs, Fluid: 5 %
Monocyte-Macrophage-Serous Fluid: 24 %
Neutrophil Count, Fluid: 71 %
Other Cells, Fluid: 0 %
Total Nucleated Cell Count, Fluid: 1690 cu mm

## 2019-06-04 LAB — URINALYSIS, COMPLETE (UACMP) WITH MICROSCOPIC
Bacteria, UA: NONE SEEN
Bilirubin Urine: NEGATIVE
Glucose, UA: NEGATIVE mg/dL
Hgb urine dipstick: NEGATIVE
Ketones, ur: NEGATIVE mg/dL
Leukocytes,Ua: NEGATIVE
Nitrite: NEGATIVE
Protein, ur: NEGATIVE mg/dL
Specific Gravity, Urine: 1.017 (ref 1.005–1.030)
pH: 5 (ref 5.0–8.0)

## 2019-06-04 LAB — AMYLASE, PLEURAL OR PERITONEAL FLUID: Amylase, Fluid: 8 U/L

## 2019-06-04 LAB — PROTEIN, PLEURAL OR PERITONEAL FLUID: Total protein, fluid: <3 g/dL

## 2019-06-04 LAB — LACTATE DEHYDROGENASE, PLEURAL OR PERITONEAL FLUID: LD, Fluid: 29 U/L — ABNORMAL HIGH (ref 3–23)

## 2019-06-04 LAB — GLUCOSE, PLEURAL OR PERITONEAL FLUID: Glucose, Fluid: 129 mg/dL

## 2019-06-04 LAB — PREPARE RBC (CROSSMATCH)

## 2019-06-04 LAB — ALBUMIN, PLEURAL OR PERITONEAL FLUID: Albumin, Fluid: <1 g/dL

## 2019-06-04 IMAGING — CT CT CTA ABD/PEL W/CM AND/OR W/O CM
3 of 10 series · 9 of 46 positions shown, 15 images · IV contrast (omnipaque)
Comparison: Prior CT scan of the abdomen and pelvis [DATE]

CLINICAL DATA: 57-year-old female with abdominal distension,
abdominal pain and upper GI bleeding secondary to portal
hypertension/cirrhosis.

EXAM:
CTA ABDOMEN AND PELVIS wITHOUT AND WITH CONTRAST
TECHNIQUE: Multidetector CT imaging of the abdomen and pelvis was performed
using the standard protocol during bolus administration of
intravenous contrast. Multiplanar reconstructed images and MIPs were
obtained and reviewed to evaluate the vascular anatomy.
CONTRAST:  100mL OMNIPAQUE IOHEXOL 350 MG/ML SOLN

[Series 5: axial arterial · axial · arterial · 0.75mm/px · z∈[+961,+1105]mm · 3 of 253 slices shown]
[im 19/253  soft-tissue]
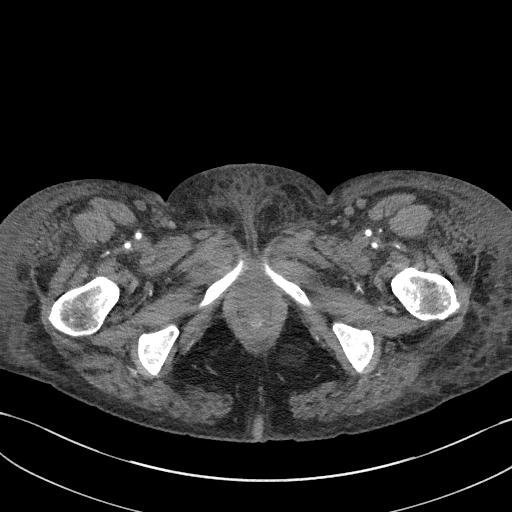
[im 55/253  soft-tissue]
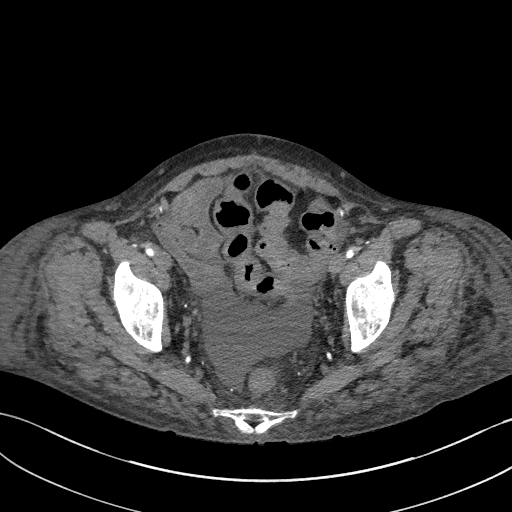
[im 91/253  soft-tissue]
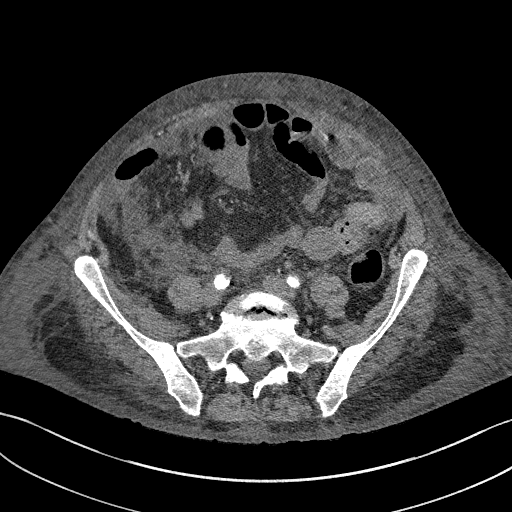

[Series 6: axial venous · axial · portal-venous · 0.75mm/px · z∈[+1023,+1323]mm · 4 of 101 slices shown, 9 images]
[im 21/101  soft-tissue]
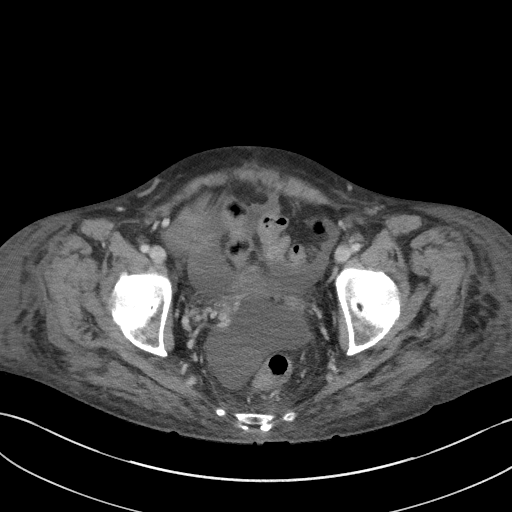
[im 21/101  lung]
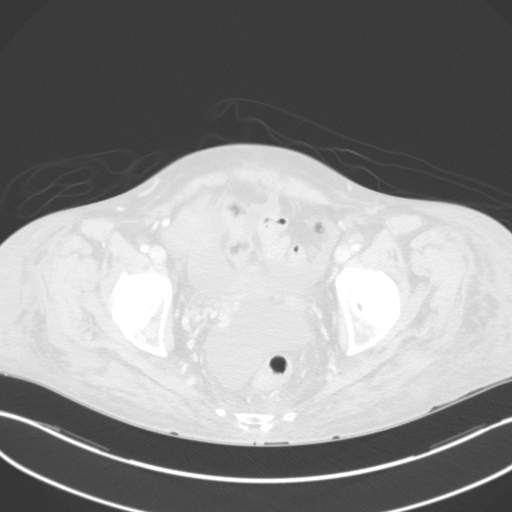
[im 21/101  bone]
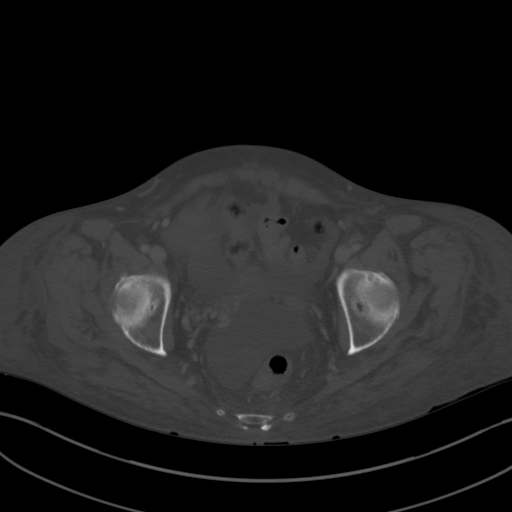
[im 41/101  soft-tissue]
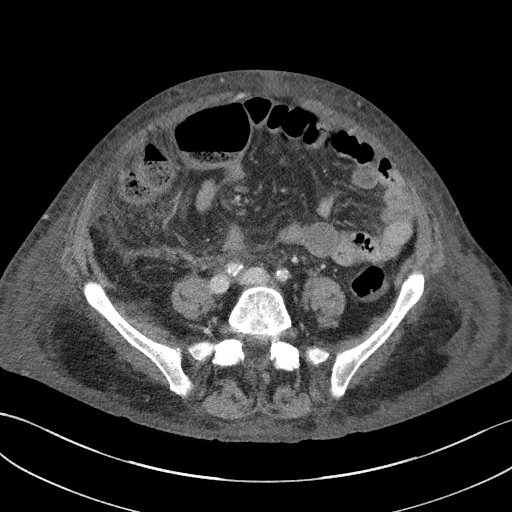
[im 41/101  lung]
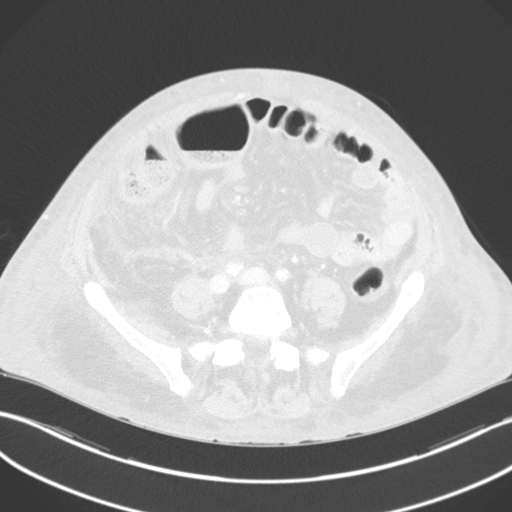
[im 61/101  soft-tissue]
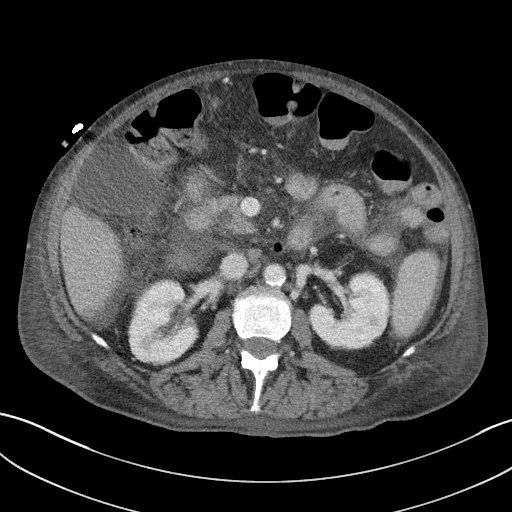
[im 61/101  lung]
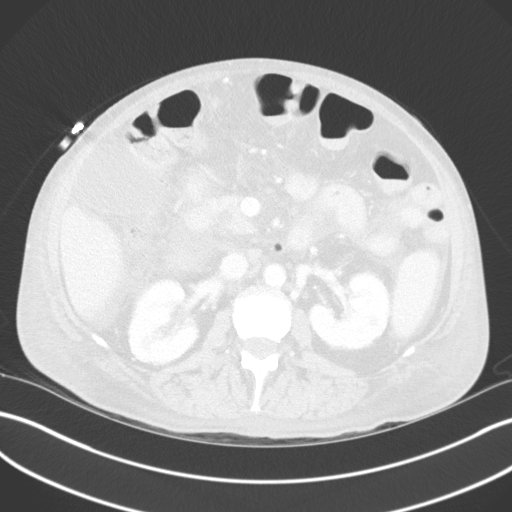
[im 81/101  soft-tissue]
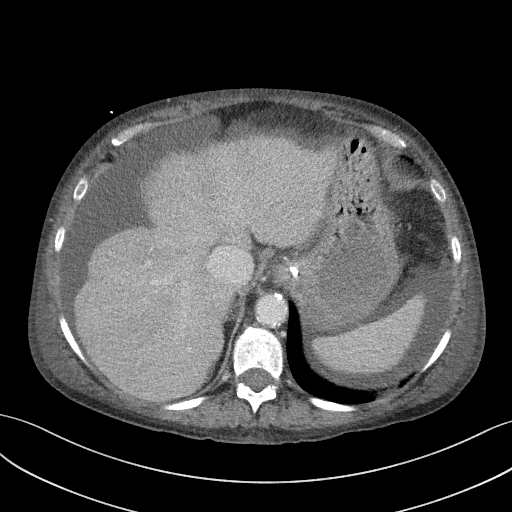
[im 81/101  lung]
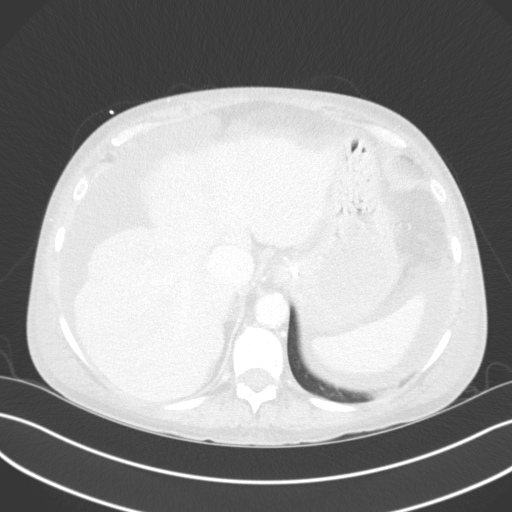

[Series 9: coronal mpr · coronal · 0.77mm/px · 2 of 149 slices shown, 3 images]
[im 50/149  soft-tissue]
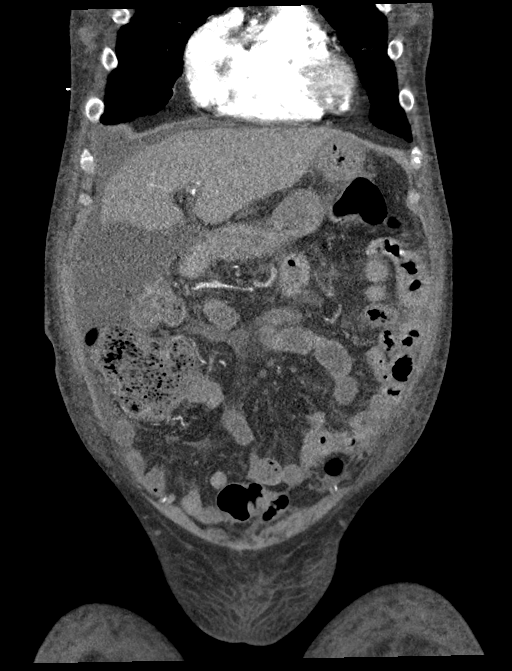
[im 50/149  bone]
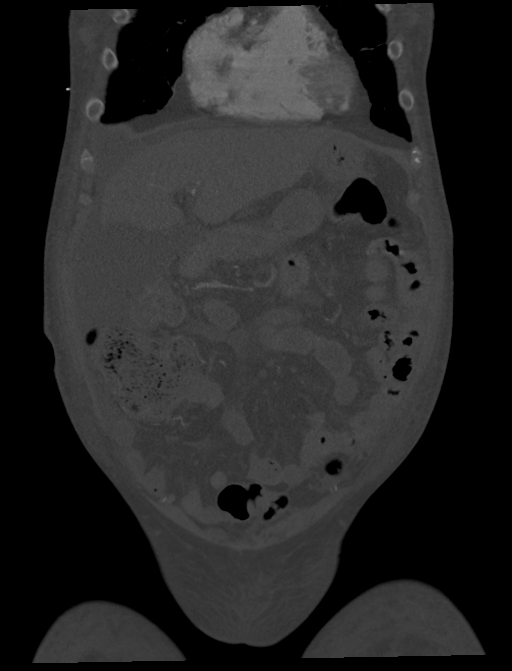
[im 99/149  soft-tissue]
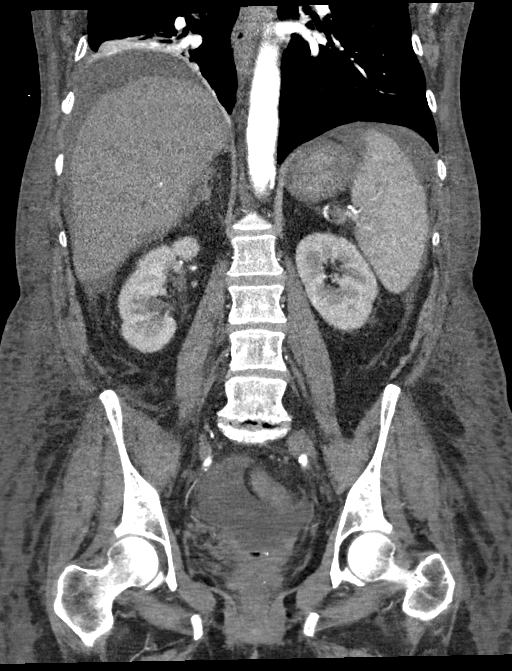

[9 of 46 positions shown; findings below may reference images not displayed]

FINDINGS: VASCULAR

Aorta: Heterogeneous atherosclerotic plaque throughout the abdominal
aorta without evidence of aneurysm or dissection. No flow limiting
stenosis.

Celiac: Minimal atherosclerotic plaque at the origin. The vessel is
widely patent. Variant hepatic artery anatomy. The right hepatic
artery arises from the SMA. No evidence of aneurysm, significant
stenosis or fibromuscular dysplasia.

SMA: Replaced right hepatic artery. No significant stenosis,
dissection or evidence of fibromuscular dysplasia.

Renals: Solitary renal arteries bilaterally. Irregular mild to
moderate narrowing in the mid aspect of both renal arteries may
represent underlying vascular plaque or fibromuscular dysplasia. No
evidence of aneurysm or dissection.

IMA: Patent and unremarkable.

Inflow: Bulky calcified atherosclerotic plaque results in moderate
stenosis at the origin of the left common iliac artery. No evidence
of aneurysm or dissection. The external iliac arteries are
relatively spared from disease.

Proximal Outflow: Bilateral common femoral and visualized portions
of the superficial and profunda femoral arteries are patent without
evidence of aneurysm, dissection, vasculitis or significant
stenosis.

Veins: Small esophageal varices are present. A metallic endoscopic
clip is identified at the gastroesophageal junction. No significant
gastric varices or evidence of gastro renal shunt. The portal veins
are patent. The splenic, SMV and renal veins are patent. The IVC and
iliac veins are widely patent. No evidence of thrombosis. A
recanalized paraumbilical vein is present.

Review of the MIP images confirms the above findings.

NON-VASCULAR

Lower chest: Segmental atelectasis in the lateral basal segment of
the right lower lobe. Calcification present in the subendocardium of
the ventricular apex consistent with prior myocardial infarction.
The heart is mildly enlarged. No pericardial effusion. No distal
esophageal wall thickening. Varices are noted as mentioned above.

Hepatobiliary: Overtly cirrhotic liver with a diffusely nodular
contour and relative hypertrophy of the left and caudate lobes with
atrophy of the right hepatic lobe. No discrete hepatic lesion
identified. No significant biliary ductal dilatation. Mild
gallbladder distension.

Pancreas: Unremarkable. No pancreatic ductal dilatation or
surrounding inflammatory changes.

Spleen: Normal in size without focal abnormality.

Adrenals/Urinary Tract: The adrenal glands are normal. No evidence
of hydronephrosis, nephrolithiasis or enhancing renal mass. The
ureters and bladder are unremarkable.

Stomach/Bowel: No evidence of arterial or venous hemorrhage within
the large or small bowel at this time. No hemorrhage visible within
the stomach. The proximal duodenum is unremarkable save for tandem
duodenal diverticula in the horizontal segment. Diffuse mild
submucosal edema throughout the small bowel likely representing
portal enteropathy. No focal bowel wall thickening or evidence of
obstruction.

Lymphatic: No suspicious lymphadenopathy.

Reproductive: Surgical changes of prior hysterectomy. No adnexal
masses.

Other: Mild ascites.

Musculoskeletal: Extensive skin thickening and strandy infiltration
throughout the superficial subcutaneous fat consistent with body
wall edema/anasarca. No acute fracture or aggressive appearing lytic
or blastic osseous lesion.
IMPRESSION: VASCULAR

1. No evidence of active arterial or venous bleeding at this time.
2. Small esophageal varices within endoscopically placed clip
present at the gastroesophageal junction.
3. No evidence of gastro renal shunt or isolated gastric varices.
4.  Aortic Atherosclerosis ([V9]-170.0).
5. Bulky calcified plaque results in moderate stenosis at the origin
of the left common iliac artery.
6. Mildly irregular fibrofatty atherosclerotic plaque versus
fibromuscular dysplasia in the mid segment of the bilateral renal
arteries. The degree of stenosis does not appear hemodynamically
significant.
7. Mild cardiomegaly.
8. Sequelae of prior small myocardial infarction in the
subendocardial lining of the left ventricular apex.

NON-VASCULAR

1. Hepatic cirrhosis with portal hypertension and small volume
ascites.
2. Marked body wall edema consistent with anasarca.
3. Diffuse mild submucosal edema throughout the small bowel
consistent with portal enteropathy.
4. Segmental atelectasis in the right lower lobe.
5. Additional ancillary findings as above.

## 2019-06-04 IMAGING — US US PARACENTESIS
1 series · 10 of 10 positions shown · non-contrast
Comparison: none

INDICATION: Recurrent ascites

[Series 1: us paracentesis · 0.25mm/px · 10 of 10 slices shown]
[im 1/10]
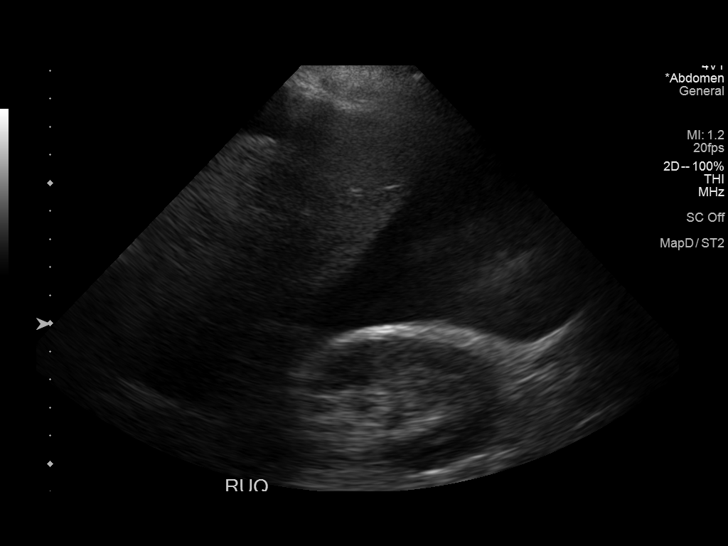
[im 2/10]
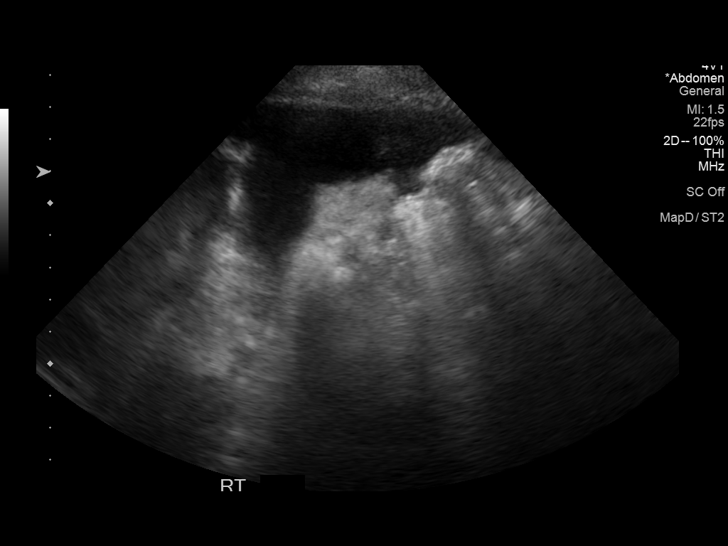
[im 3/10]
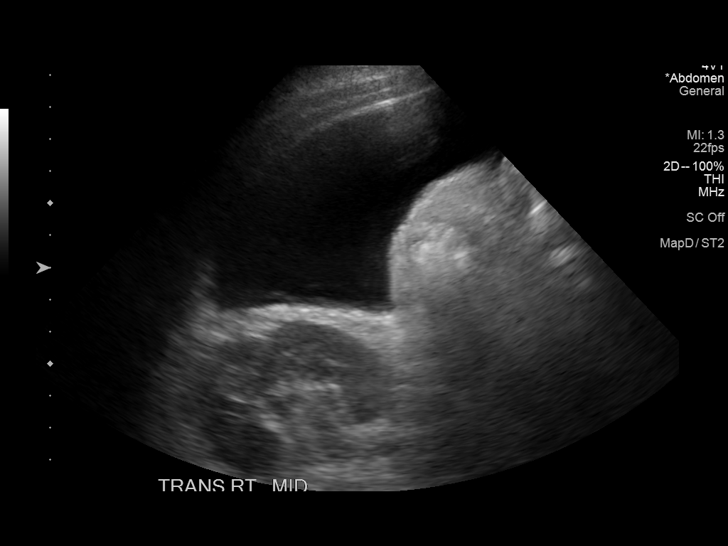
[im 4/10]
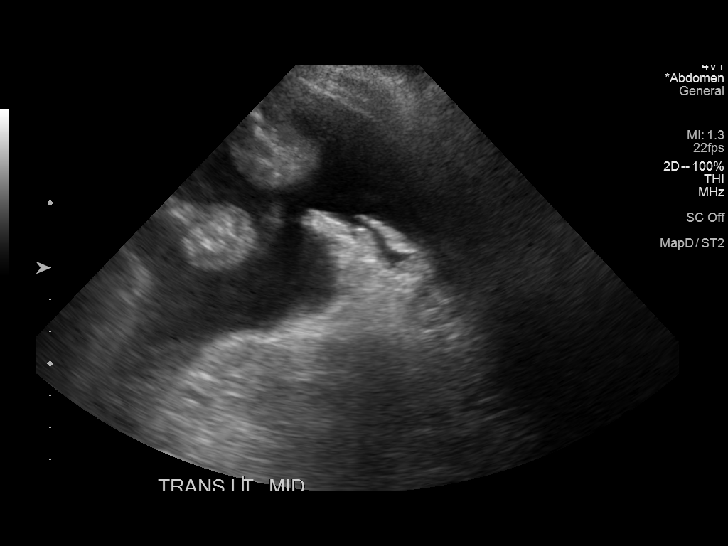
[im 5/10]
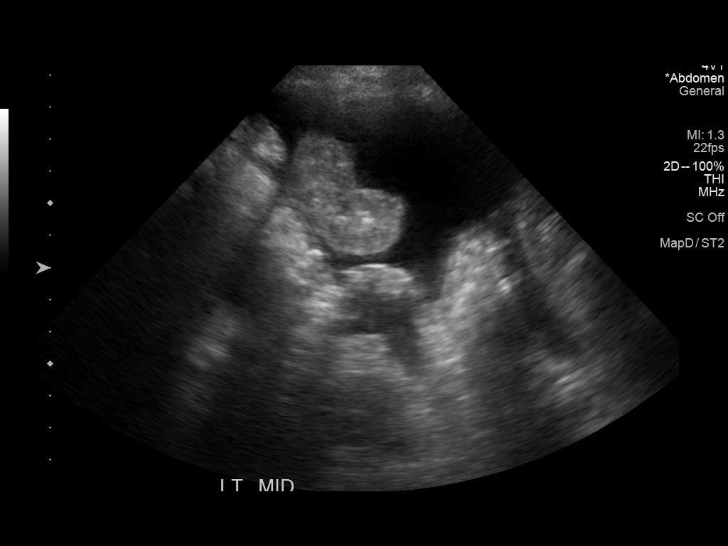
[im 6/10]
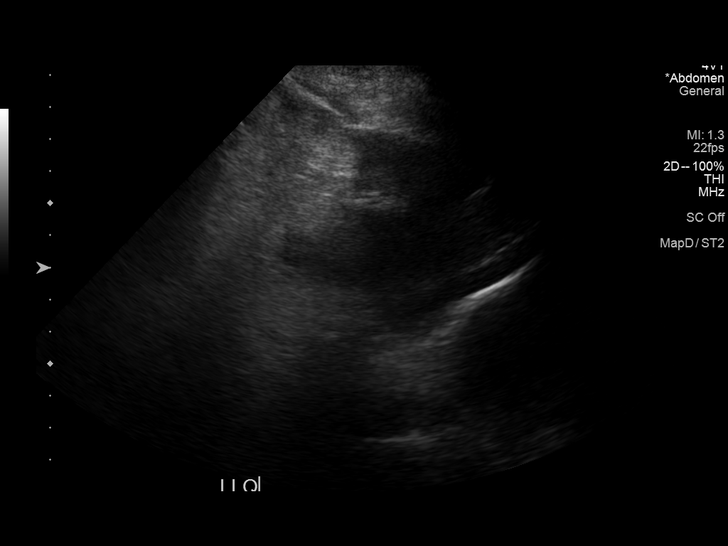
[im 7/10]
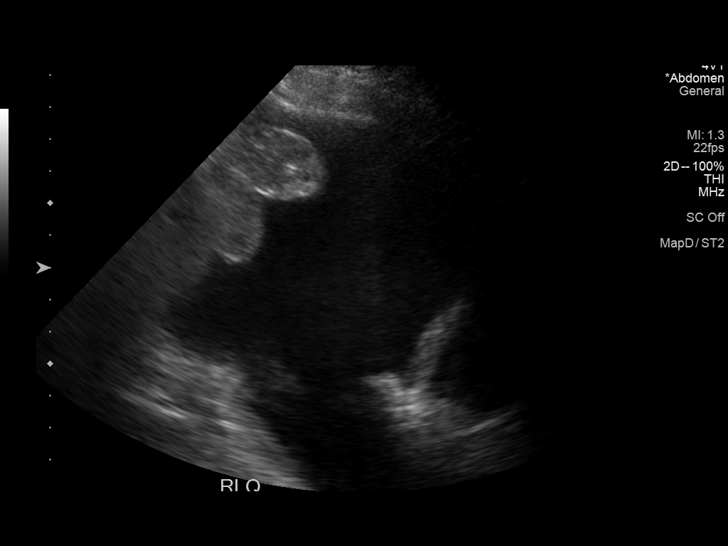
[im 8/10]
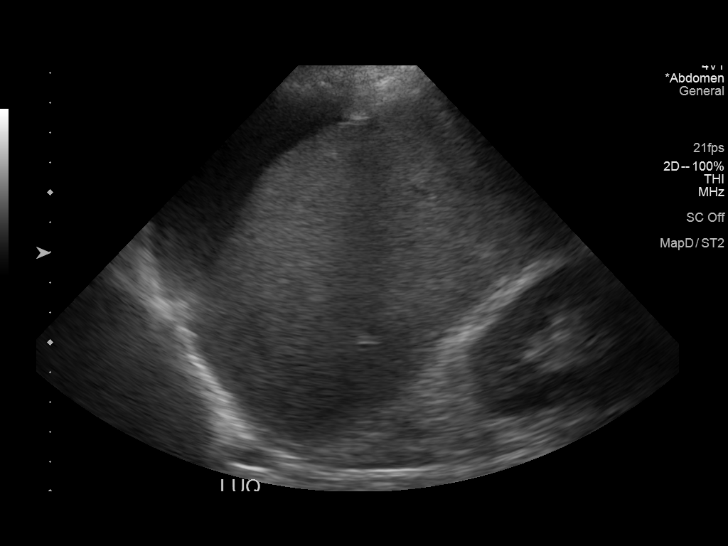
[im 9/10]
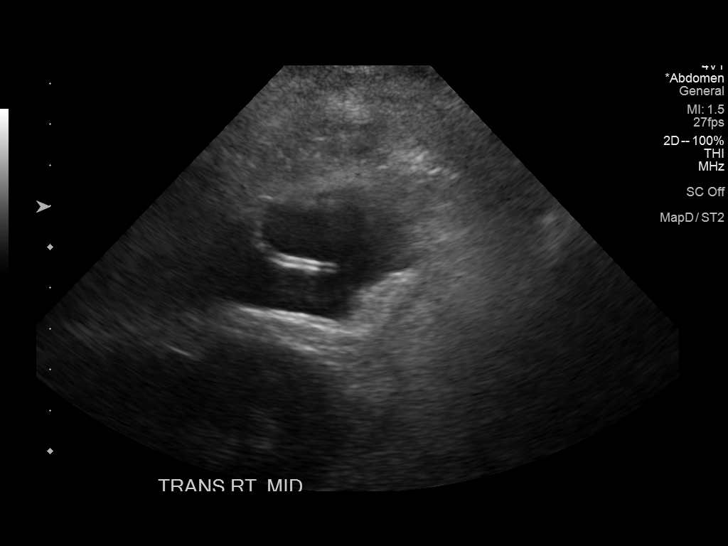
[im 10/10]
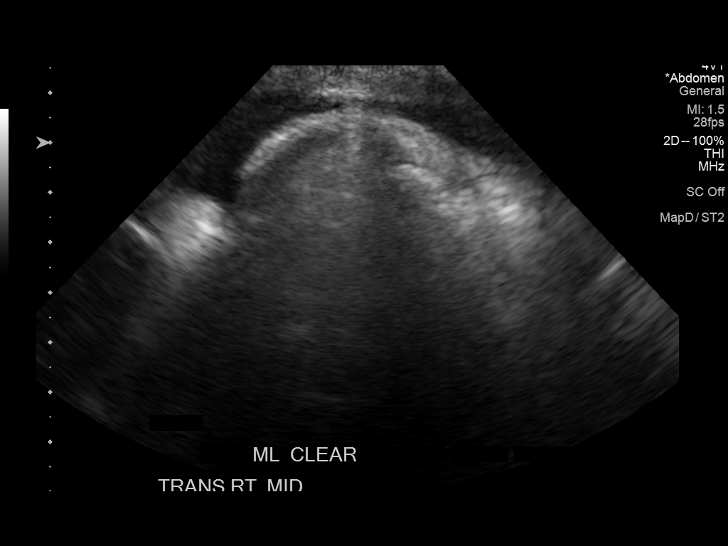

[10 of 10 positions shown; findings below may reference images not displayed]

EXAM:
ULTRASOUND GUIDED PARACENTESIS

MEDICATIONS:
None.

COMPLICATIONS:
None immediate.

PROCEDURE:
Informed written consent was obtained from the patient after a
discussion of the risks, benefits and alternatives to treatment. A
timeout was performed prior to the initiation of the procedure.

Initial ultrasound scanning demonstrates a moderate amount of
ascites within the right lower abdominal quadrant. The right lower
abdomen was prepped and draped in the usual sterile fashion. 1%
lidocaine was used for local anesthesia.

Following this, a 6 Fr Safe-T-Centesis catheter was introduced. An
ultrasound image was saved for documentation purposes. The
paracentesis was performed. The catheter was removed and a dressing
was applied. The patient tolerated the procedure well without
immediate post procedural complication.
Patient received post-procedure intravenous albumin; see nursing
notes for details.
FINDINGS: A total of approximately 2.4 L of clear yellow fluid was removed.
Samples were sent to the laboratory as requested by the clinical
team.
IMPRESSION: Successful ultrasound-guided paracentesis yielding 2.4 liters of
peritoneal fluid.

## 2019-06-04 IMAGING — US US ABDOMEN LIMITED
1 series · 10 of 10 positions shown · non-contrast
Comparison: None.

CLINICAL DATA: Increased abdominal distention for 3 weeks.
Abdominal pain. Cirrhosis.

EXAM:
LIMITED ABDOMEN ULTRASOUND FOR ASCITES
TECHNIQUE: Limited ultrasound survey for ascites was performed in all four
abdominal quadrants.

[Series 1: us abdomen limited · 0.30mm/px · 10 of 10 slices shown]
[im 1/10]
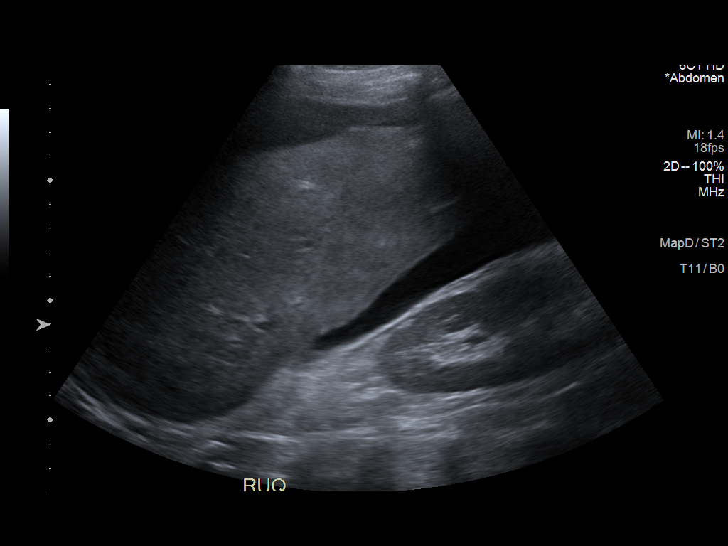
[im 2/10]
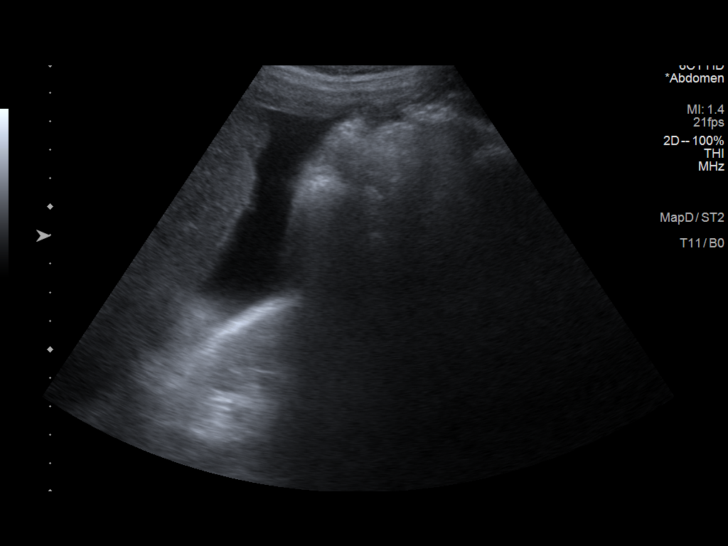
[im 3/10]
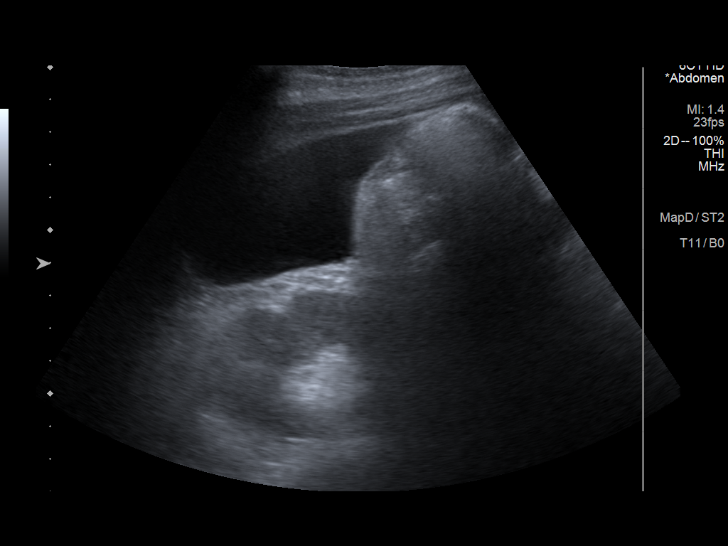
[im 4/10]
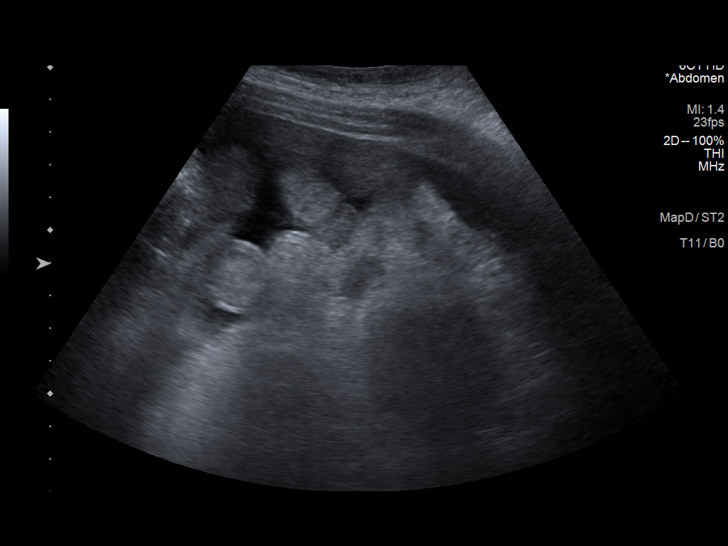
[im 5/10]
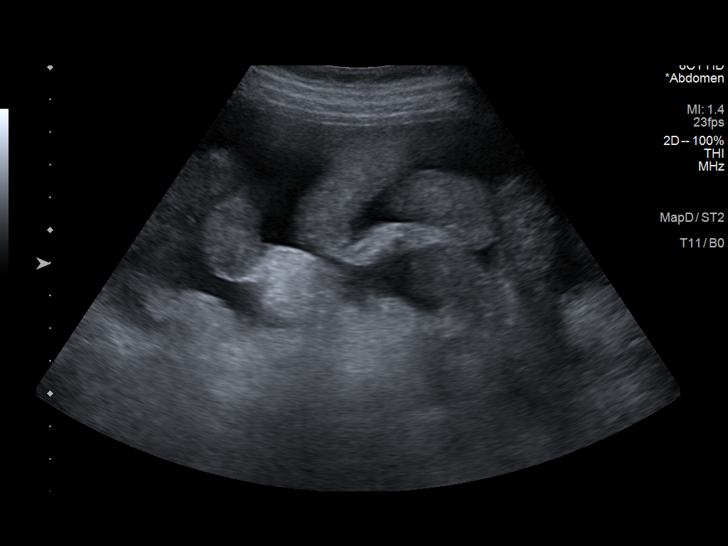
[im 6/10]
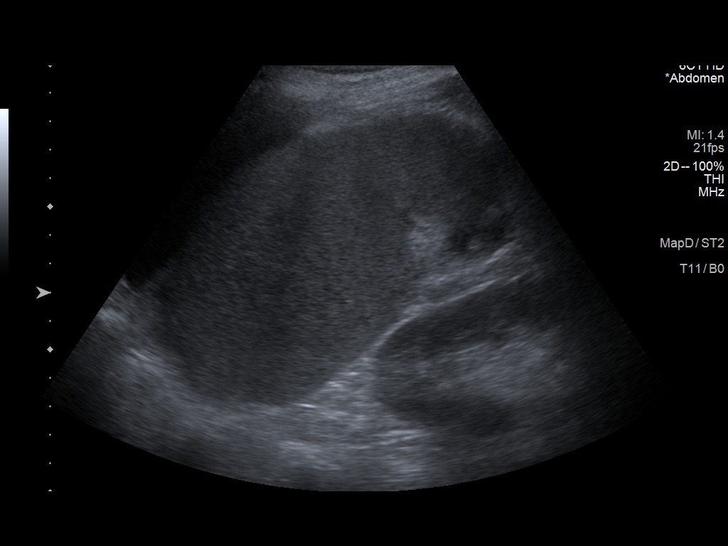
[im 7/10]
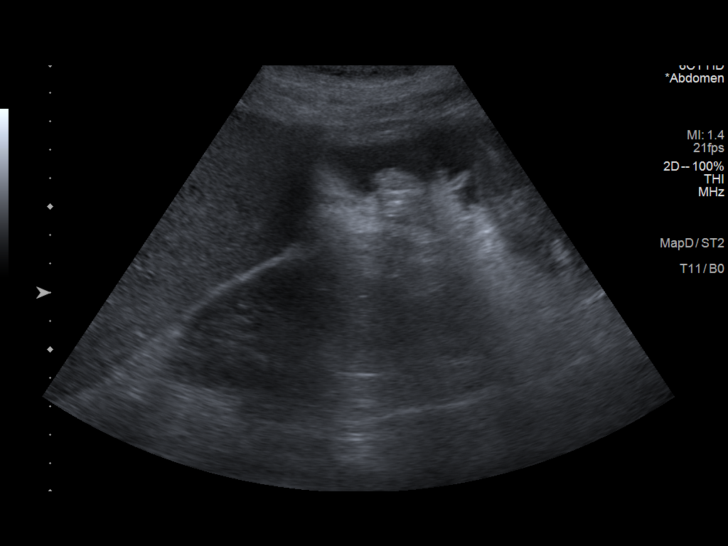
[im 8/10]
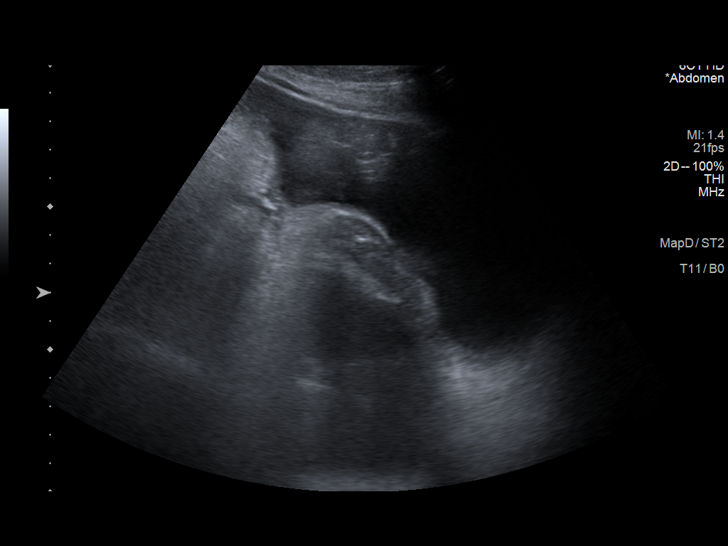
[im 9/10]
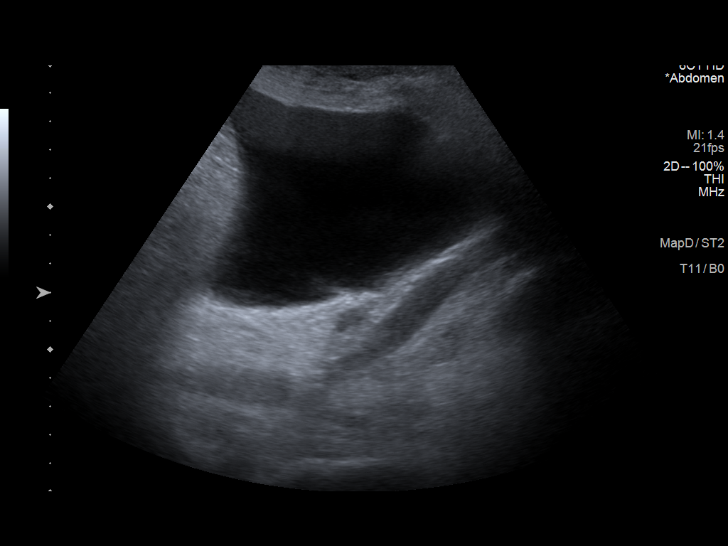
[im 10/10]
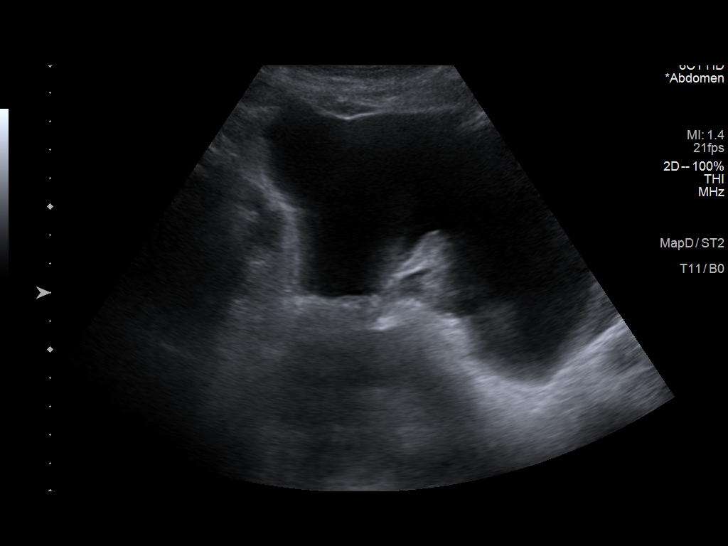

[10 of 10 positions shown; findings below may reference images not displayed]

FINDINGS: Moderate amount of ascites is seen in all 4 abdominal quadrants.
IMPRESSION: Moderate ascites.

## 2019-06-04 MED ORDER — SODIUM CHLORIDE 0.9% IV SOLUTION: Freq: Once | INTRAVENOUS | Status: DC

## 2019-06-04 MED ORDER — BISACODYL 10 MG RE SUPP: 10.0000 mg | Freq: Every day | RECTAL | Status: DC | PRN

## 2019-06-04 MED ORDER — SIMETHICONE 80 MG PO CHEW
80.0000 mg | CHEWABLE_TABLET | Freq: Once | ORAL | Status: AC
  Administered 2019-06-04: 80 mg via ORAL
  Filled 2019-06-04: qty 1

## 2019-06-04 MED ORDER — PANTOPRAZOLE SODIUM 40 MG PO TBEC
40.0000 mg | DELAYED_RELEASE_TABLET | Freq: Two times a day (BID) | ORAL | Status: DC
  Administered 2019-06-04 – 2019-06-11 (×14): 40 mg via ORAL
  Filled 2019-06-04 (×14): qty 1

## 2019-06-04 MED ORDER — IOHEXOL 350 MG/ML SOLN
100.0000 mL | Freq: Once | INTRAVENOUS | Status: AC | PRN
  Administered 2019-06-04: 100 mL via INTRAVENOUS

## 2019-06-04 MED ORDER — ACETAMINOPHEN 325 MG PO TABS
650.0000 mg | ORAL_TABLET | Freq: Once | ORAL | Status: AC
  Administered 2019-06-04: 650 mg via ORAL
  Filled 2019-06-04: qty 2

## 2019-06-04 NOTE — Progress Notes (Signed)
[facility] NAME: Anita Reed    MR#:  [id number]  DATE OF BIRTH:  28-Jun-1961  SUBJECTIVE:  CHIEF COMPLAINT:   Chief Complaint  Patient presents with  . Abdominal Pain  . GI Bleeding   The patient complains of melena during preparation for colonoscopy yesterday REVIEW OF SYSTEMS:  Review of Systems  Constitutional: Positive for malaise/fatigue. Negative for chills and fever.  HENT: Negative for sore throat.   Eyes: Negative for blurred vision and double vision.  Respiratory: Negative for cough, hemoptysis, shortness of breath, wheezing and stridor.   Cardiovascular: Negative for chest pain, palpitations, orthopnea and leg swelling.  Gastrointestinal: Positive for melena. Negative for abdominal pain, blood in stool, diarrhea, nausea and vomiting.  Genitourinary: Negative for dysuria, flank pain and hematuria.  Musculoskeletal: Negative for back pain and joint pain.  Skin: Negative for rash.  Neurological: Negative for dizziness, sensory change, focal weakness, seizures, loss of consciousness, weakness and headaches.  Endo/Heme/Allergies: Negative for polydipsia.  Psychiatric/Behavioral: Negative for depression. The patient is not nervous/anxious.     DRUG ALLERGIES:  No Known Allergies VITALS:  Blood pressure 117/69, pulse 96, temperature 98 F (36.7 C), temperature source Oral, resp. rate 18, height 5\' 8"  (1.727 m), weight 73.3 kg, SpO2 95 %. PHYSICAL EXAMINATION:  Physical Exam Constitutional:      General: She is not in acute distress. HENT:     Head: Normocephalic.     Mouth/Throat:     Mouth: Mucous membranes are moist.  Eyes:     General: No scleral icterus.    Conjunctiva/sclera: Conjunctivae normal.     Pupils: Pupils are equal, round, and reactive to light.  Neck:     Musculoskeletal: Normal range of motion and neck supple.     Vascular: No JVD.     Trachea: No tracheal deviation.  Cardiovascular:    Rate and Rhythm: Normal rate and regular rhythm.     Heart sounds: Normal heart sounds. No murmur. No gallop.   Pulmonary:     Effort: Pulmonary effort is normal. No respiratory distress.     Breath sounds: Normal breath sounds. No wheezing or rales.  Abdominal:     General: Bowel sounds are normal. There is no distension.     Palpations: Abdomen is soft.     Tenderness: There is no abdominal tenderness. There is no rebound.  Musculoskeletal: Normal range of motion.        General: No tenderness.     Right lower leg: No edema.     Left lower leg: No edema.  Skin:    Findings: No erythema or rash.  Neurological:     General: No focal deficit present.     Mental Status: She is alert and oriented to person, place, and time.     Cranial Nerves: No cranial nerve deficit.  Psychiatric:        Mood and Affect: Mood normal.    LABORATORY PANEL:  Female CBC Recent Labs  Lab 06/04/19 0616  WBC 15.0*  HGB 6.3*  HCT 20.5*  PLT 201   ------------------------------------------------------------------------------------------------------------------ Chemistries  Recent Labs  Lab 06/02/19 1100 06/03/19 0651  NA  --  136  K  --  3.6  CL  --  106  CO2  --  22  GLUCOSE  --  127*  BUN  --  22*  CREATININE  --  0.57  CALCIUM  --  8.4*  MG 1.8  --  AST  --  58*  ALT  --  28  ALKPHOS  --  232*  BILITOT  --  2.6*   RADIOLOGY:  US Abdomen Limited  Result Date: 06/04/2019 CLINICAL DATA:  Increased abdominal distention for 3 weeks. Abdominal pain. Cirrhosis. EXAM: LIMITED ABDOMEN ULTRASOUND FOR ASCITES TECHNIQUE: Limited ultrasound survey for ascites was performed in all four abdominal quadrants. COMPARISON:  None. FINDINGS: Moderate amount of ascites is seen in all 4 abdominal quadrants. IMPRESSION: Moderate ascites. Electronically Signed   By: Marlaine Hind M.D.   On: 06/04/2019 09:43   Dg Chest Port 1 View  Result Date: 06/03/2019 CLINICAL DATA:  Leukocytosis.  Colonoscopy this  morning.  GI bleed. EXAM: PORTABLE CHEST 1 VIEW COMPARISON:  01/30/2019 FINDINGS: Lungs are adequately inflated without lobar consolidation or effusion. Cardiomediastinal silhouette and remainder of the exam is unchanged. IMPRESSION: No acute cardiopulmonary disease. Electronically Signed   By: Marin Olp M.D.   On: 06/03/2019 16:44   ASSESSMENT AND PLAN:   58 year old female with liver cirrhosis and portal hypertension who presents to the emergency room due to melena and hematemesis.  1.  Upper GI bleed from underlying liver cirrhosis/portal hypertension in the setting of using NSAIDs. The patient has been treated with PPI and octreotide S/p EGD: Grade II and large (> 5 mm) esophageal varices. Portal hypertensive gastropathy. Elective colonoscopy: Pseudomembranous enterocolitis, Blood in the entire examined colon and Non-bleeding internal hemorrhoids per Dr. Alice Reichert. Discontinued octreotide and Protonix drip.  Start p.o. Protonix twice daily.  2.  Acute on chronic blood loss anemia due to problem #1:  S/p 2 unit PRBC transfusion.  Hemoglobin decreased to 6.3 today.  1 more unit PRBC transfusion and follow-up hemoglobin.  3.  Portal hypertension due to liver cirrhosis from chronic EtOH: Hold all medications including spironolactone, nadolol and Lasix for now due to low blood pressure and GI blood loss.  4.  EtOH abuse: Patient has not had any alcohol for several months  5.    Tachycardia due to above: Continue telemetry monitoring.  IV fluid support.  Improved.  Leukocytosis.  Unclear etiology.  Improving, chest x-ray and urinalysis are unremarkable, and abdominal ultrasound: moderate ascites.  Paracentesis.  Tobacco abuse.  Smoking cessation was counseled for 3 to 4 minutes.  The patient refused nicotine patch. All the records are reviewed and case discussed with Care Management/Social Worker. Management plans discussed with the patient, family and they are in agreement.  CODE  STATUS: Full Code  TOTAL TIME TAKING CARE OF THIS PATIENT: 30 minutes.   More than 50% of the time was spent in counseling/coordination of care: YES  POSSIBLE D/C IN 2-3 DAYS, DEPENDING ON CLINICAL CONDITION.   Demetrios Loll M.D on 06/04/2019 at 10:58 AM  Between 7am to 6pm - Pager - [phone]  After 6pm go to [email or web address] - [facility]

## 2019-06-04 NOTE — Plan of Care (Signed)

## 2019-06-04 NOTE — Procedures (Signed)
US paracentesis without difficulty  Complications:  None  Blood Loss: none  See dictation in canopy pacs  

## 2019-06-04 NOTE — Progress Notes (Signed)
[facility] Inpatient Progress Note  Subjective: Patient seen for f/u colitis. No further bleeding or bm's. Patient is distended even after paracentesis. Having bloating and failure to pass flatus or stool at this time. CT angiogram has been ordered by me to rule out mesenteric ischemia given colitis and bleeding history.  Objective: Vital signs in last 24 hours: Temp:  [97.7 F (36.5 C)-98.4 F (36.9 C)] 97.7 F (36.5 C) (10/05 1726) Pulse Rate:  [91-111] 99 (10/05 1726) Resp:  [16-20] 16 (10/05 1726) BP: (104-129)/(64-83) 129/81 (10/05 1726) SpO2:  [95 %-100 %] 95 % (10/05 1726) Weight:  [73.3 kg] 73.3 kg (10/05 0537) Blood pressure 129/81, pulse 99, temperature 97.7 F (36.5 C), temperature source Oral, resp. rate 16, height 5\' 8"  (1.727 m), weight 73.3 kg, SpO2 95 %.    Intake/Output from previous day: 10/04 0701 - 10/05 0700 In: 1076.7 [I.V.:1076.7] Out: 200 [Urine:200]  Intake/Output this shift: Total I/O In: 1253.3 [I.V.:923.3; Blood:330] Out: -    General appearance: Alert NAD Resp:  CTA Cardio:  RRR GI: Distended w/ ascites. BS+ No rebound or masses. Extremities:  No edema   Lab Results: Results for orders placed or performed during the hospital encounter of 06/01/19 (from the past 24 hour(s))  Urinalysis, Complete w Microscopic     Status: Abnormal   Collection Time: 06/04/19  1:03 AM  Result Value Ref Range   Color, Urine YELLOW (A) YELLOW   APPearance CLEAR (A) CLEAR   Specific Gravity, Urine 1.017 1.005 - 1.030   pH 5.0 5.0 - 8.0   Glucose, UA NEGATIVE NEGATIVE mg/dL   Hgb urine dipstick NEGATIVE NEGATIVE   Bilirubin Urine NEGATIVE NEGATIVE   Ketones, ur NEGATIVE NEGATIVE mg/dL   Protein, ur NEGATIVE NEGATIVE mg/dL   Nitrite NEGATIVE NEGATIVE   Leukocytes,Ua NEGATIVE NEGATIVE   WBC, UA 0-5 0 - 5 WBC/hpf   Bacteria, UA NONE SEEN NONE SEEN   Squamous Epithelial / LPF 0-5 0 - 5  CBC     Status: Abnormal   Collection Time:  06/04/19  6:16 AM  Result Value Ref Range   WBC 15.0 (H) 4.0 - 10.5 K/uL   RBC 2.46 (L) 3.87 - 5.11 MIL/uL   Hemoglobin 6.3 (L) 12.0 - 15.0 g/dL   HCT 20.5 (L) 36.0 - 46.0 %   MCV 83.3 80.0 - 100.0 fL   MCH 25.6 (L) 26.0 - 34.0 pg   MCHC 30.7 30.0 - 36.0 g/dL   RDW 22.2 (H) 11.5 - 15.5 %   Platelets 201 150 - 400 K/uL   nRBC 0.4 (H) 0.0 - 0.2 %  Prepare RBC     Status: None   Collection Time: 06/04/19  8:13 AM  Result Value Ref Range   Order Confirmation      ORDER PROCESSED BY BLOOD BANK Performed at [facility], [address] [place], [place] [address]   Lactate dehydrogenase (pleural or peritoneal fluid)     Status: Abnormal   Collection Time: 06/04/19  1:50 PM  Result Value Ref Range   LD, Fluid 29 (H) 3 - 23 U/L   Fluid Type-FLDH PERITONEAL   Albumin, pleural or peritoneal fluid     Status: None   Collection Time: 06/04/19  1:50 PM  Result Value Ref Range   Albumin, Fluid <1.0 g/dL   Fluid Type-FALB PERITONEAL   Protein, pleural or peritoneal fluid     Status: None   Collection Time: 06/04/19  1:50 PM  Result Value Ref  Range   Total protein, fluid <3.0 g/dL   Fluid Type-FTP PERITONEAL   Glucose, pleural or peritoneal fluid     Status: None   Collection Time: 06/04/19  1:50 PM  Result Value Ref Range   Glucose, Fluid 129 mg/dL   Fluid Type-FGLU PERITONEAL   Amylase, pleural or peritoneal fluid     Status: None   Collection Time: 06/04/19  1:50 PM  Result Value Ref Range   Amylase, Fluid 8 U/L   Fluid Type-FAMY PERITONEAL      Recent Labs    06/02/19 0534  06/02/19 1751 06/03/19 0651 06/04/19 0616  WBC 19.5*  --   --  27.3* 15.0*  HGB 8.7*   < > 8.5* 7.9* 6.3*  HCT 27.7*  --   --  24.2* 20.5*  PLT 246  --   --  251 201   < > = values in this interval not displayed.   BMET Recent Labs    06/02/19 0534 06/03/19 0651  NA 138 136  K 4.6 3.6  CL 107 106  CO2 18* 22  GLUCOSE 139* 127*  BUN 25* 22*  CREATININE 0.69 0.57  CALCIUM 8.6*  8.4*   LFT Recent Labs    06/02/19 0534 06/03/19 0651  PROT 6.0* 6.0*  ALBUMIN 3.6 3.7  AST 66* 58*  ALT 25 28  ALKPHOS 262* 232*  BILITOT 2.8* 2.6*  BILIDIR 1.0*  --   IBILI 1.8*  --    PT/INR No results for input(s): LABPROT, INR in the last 72 hours. Hepatitis Panel No results for input(s): HEPBSAG, HCVAB, HEPAIGM, HEPBIGM in the last 72 hours. C-Diff No results for input(s): CDIFFTOX in the last 72 hours. No results for input(s): CDIFFPCR in the last 72 hours.   Studies/Results: US Abdomen Limited  Result Date: 06/04/2019 CLINICAL DATA:  Increased abdominal distention for 3 weeks. Abdominal pain. Cirrhosis. EXAM: LIMITED ABDOMEN ULTRASOUND FOR ASCITES TECHNIQUE: Limited ultrasound survey for ascites was performed in all four abdominal quadrants. COMPARISON:  None. FINDINGS: Moderate amount of ascites is seen in all 4 abdominal quadrants. IMPRESSION: Moderate ascites. Electronically Signed   By: Marlaine Hind M.D.   On: 06/04/2019 09:43   US Paracentesis  Result Date: 06/04/2019 INDICATION: Recurrent ascites EXAM: ULTRASOUND GUIDED PARACENTESIS MEDICATIONS: None. COMPLICATIONS: None immediate. PROCEDURE: Informed written consent was obtained from the patient after a discussion of the risks, benefits and alternatives to treatment. A timeout was performed prior to the initiation of the procedure. Initial ultrasound scanning demonstrates a moderate amount of ascites within the right lower abdominal quadrant. The right lower abdomen was prepped and draped in the usual sterile fashion. 1% lidocaine was used for local anesthesia. Following this, a 6 Fr Safe-T-Centesis catheter was introduced. An ultrasound image was saved for documentation purposes. The paracentesis was performed. The catheter was removed and a dressing was applied. The patient tolerated the procedure well without immediate post procedural complication. Patient received post-procedure intravenous albumin; see nursing  notes for details. FINDINGS: A total of approximately 2.4 L of clear yellow fluid was removed. Samples were sent to the laboratory as requested by the clinical team. IMPRESSION: Successful ultrasound-guided paracentesis yielding 2.4 liters of peritoneal fluid. Electronically Signed   By: Inez Catalina M.D.   On: 06/04/2019 16:24   Dg Chest Port 1 View  Result Date: 06/03/2019 CLINICAL DATA:  Leukocytosis.  Colonoscopy this morning.  GI bleed. EXAM: PORTABLE CHEST 1 VIEW COMPARISON:  01/30/2019 FINDINGS: Lungs are adequately inflated without lobar  consolidation or effusion. Cardiomediastinal silhouette and remainder of the exam is unchanged. IMPRESSION: No acute cardiopulmonary disease. Electronically Signed   By: Marin Olp M.D.   On: 06/03/2019 16:44    Scheduled Inpatient Medications:   . sodium chloride   Intravenous Once  . pantoprazole  40 mg Oral BID    Continuous Inpatient Infusions:   . sodium chloride 100 mL/hr at 06/04/19 0900    PRN Inpatient Medications:  linaclotide, metoprolol tartrate, morphine injection, ondansetron **OR** ondansetron (ZOFRAN) IV, oxyCODONE, polyethylene glycol    Assessment:  1. Esophageal varices - Stable, not bleeding. No endoscopic hemostasis with banding, injection, etc. Needed on this hospital admission.  2. Acute hemorrhagic colitis - improving clinically. CT pending.  3. Obstipation symptoms.  4. Hx of narcotic analgesic use which can exacerbate #3.  5. Alcoholism, presumably in remission for several months.   4. Child Pugh Class "C" cirrhosis  Plan:  1. Await CTA results.  2. Dulcolax supp and enemas. Stop Linzess.  3. Miralax   Teodoro K. Alice Reichert, M.D. 06/04/2019, 6:36 PM

## 2019-06-04 NOTE — Progress Notes (Addendum)
Pt compliants of not being able to pass gas. On assessment pt does have an active bowel sounds and distended abdomen. Pt requested to Dr. Alice Reichert today. Notify prime. Will continue to monitor.  Update 0614: Talked to Dr. Talmadge Chad and states to notify GI to see pt. Will notify. Will continue to monitor.   Update [phone]: Talked to Dr. Alice Reichert and ordered Simethicone 80 mg oral once daily. Dr. Alice Reichert also notified that pt wants to see him this morning. Dr. Alice Reichert states will see pt today. Will continue to monitor.

## 2019-06-05 ENCOUNTER — Encounter: Payer: Self-pay | Admitting: *Deleted

## 2019-06-05 LAB — TYPE AND SCREEN
ABO/RH(D): O POS
Antibody Screen: NEGATIVE
Unit division: 0
Unit division: 0
Unit division: 0
Unit division: 0
Unit division: 0

## 2019-06-05 LAB — CBC
HCT: 25.9 % — ABNORMAL LOW (ref 36.0–46.0)
Hemoglobin: 8.1 g/dL — ABNORMAL LOW (ref 12.0–15.0)
MCH: 26 pg (ref 26.0–34.0)
MCHC: 31.3 g/dL (ref 30.0–36.0)
MCV: 83.3 fL (ref 80.0–100.0)
Platelets: 203 10*3/uL (ref 150–400)
RBC: 3.11 MIL/uL — ABNORMAL LOW (ref 3.87–5.11)
RDW: 21.2 % — ABNORMAL HIGH (ref 11.5–15.5)
WBC: 17.7 10*3/uL — ABNORMAL HIGH (ref 4.0–10.5)
nRBC: 0.8 % — ABNORMAL HIGH (ref 0.0–0.2)

## 2019-06-05 LAB — BPAM RBC
Blood Product Expiration Date: 202010052359
Blood Product Expiration Date: 202011042359
Blood Product Expiration Date: 202011042359
Blood Product Expiration Date: 202011042359
Blood Product Expiration Date: 202011042359
ISSUE DATE / TIME: 202010021336
ISSUE DATE / TIME: 202010021336
ISSUE DATE / TIME: 202010051655
Unit Type and Rh: 5100
Unit Type and Rh: 5100
Unit Type and Rh: 5100
Unit Type and Rh: 5100
Unit Type and Rh: 5100

## 2019-06-05 LAB — HEMOGLOBIN: Hemoglobin: 7.8 g/dL — ABNORMAL LOW (ref 12.0–15.0)

## 2019-06-05 NOTE — Progress Notes (Signed)
[facility] [place] NAME: Anita Reed    MR#:  [id number]  DATE OF BIRTH:  [birth year]  SUBJECTIVE:  CHIEF COMPLAINT:   Chief Complaint  Patient presents with  . Abdominal Pain  . GI Bleeding   The patient complains of abdominal pain and requested pain medication continuously.  She has no bowel movement for 3 days. REVIEW OF SYSTEMS:  Review of Systems  Constitutional: Positive for malaise/fatigue. Negative for chills and fever.  HENT: Negative for sore throat.   Eyes: Negative for blurred vision and double vision.  Respiratory: Negative for cough, hemoptysis, shortness of breath, wheezing and stridor.   Cardiovascular: Negative for chest pain, palpitations, orthopnea and leg swelling.  Gastrointestinal: Positive for constipation. Negative for abdominal pain, blood in stool, diarrhea, melena, nausea and vomiting.  Genitourinary: Negative for dysuria, flank pain and hematuria.  Musculoskeletal: Negative for back pain and joint pain.  Skin: Negative for rash.  Neurological: Negative for dizziness, sensory change, focal weakness, seizures, loss of consciousness, weakness and headaches.  Endo/Heme/Allergies: Negative for polydipsia.  Psychiatric/Behavioral: Negative for depression. The patient is not nervous/anxious.     DRUG ALLERGIES:  No Known Allergies VITALS:  Blood pressure 128/77, pulse 99, temperature 98.1 F (36.7 C), temperature source Oral, resp. rate 17, height 5\' 8"  (1.727 m), weight 72.8 kg, SpO2 100 %. PHYSICAL EXAMINATION:  Physical Exam Constitutional:      General: She is not in acute distress. HENT:     Head: Normocephalic.     Mouth/Throat:     Mouth: Mucous membranes are moist.  Eyes:     General: No scleral icterus.    Conjunctiva/sclera: Conjunctivae normal.     Pupils: Pupils are equal, round, and reactive to light.  Neck:     Musculoskeletal: Normal range of motion and neck supple.     Vascular: No  JVD.     Trachea: No tracheal deviation.  Cardiovascular:     Rate and Rhythm: Normal rate and regular rhythm.     Heart sounds: Normal heart sounds. No murmur. No gallop.   Pulmonary:     Effort: Pulmonary effort is normal. No respiratory distress.     Breath sounds: Normal breath sounds. No wheezing or rales.  Abdominal:     General: Bowel sounds are normal. There is no distension.     Palpations: Abdomen is soft.     Tenderness: There is abdominal tenderness. There is no rebound.  Musculoskeletal: Normal range of motion.        General: No tenderness.     Right lower leg: No edema.     Left lower leg: No edema.  Skin:    Findings: No erythema or rash.  Neurological:     General: No focal deficit present.     Mental Status: She is alert and oriented to person, place, and time.     Cranial Nerves: No cranial nerve deficit.  Psychiatric:        Mood and Affect: Mood normal.    LABORATORY PANEL:  Female CBC Recent Labs  Lab 06/05/19 0429  WBC 17.7*  HGB 8.1*  HCT 25.9*  PLT 203   ------------------------------------------------------------------------------------------------------------------ Chemistries  Recent Labs  Lab 06/02/19 1100 06/03/19 0651  NA  --  136  K  --  3.6  CL  --  106  CO2  --  22  GLUCOSE  --  127*  BUN  --  22*  CREATININE  --  0.57  CALCIUM  --  8.4*  MG 1.8  --   AST  --  58*  ALT  --  28  ALKPHOS  --  232*  BILITOT  --  2.6*   RADIOLOGY:  US Paracentesis  Result Date: 06/04/2019 INDICATION: Recurrent ascites EXAM: ULTRASOUND GUIDED PARACENTESIS MEDICATIONS: None. COMPLICATIONS: None immediate. PROCEDURE: Informed written consent was obtained from the patient after a discussion of the risks, benefits and alternatives to treatment. A timeout was performed prior to the initiation of the procedure. Initial ultrasound scanning demonstrates a moderate amount of ascites within the right lower abdominal quadrant. The right lower abdomen was  prepped and draped in the usual sterile fashion. 1% lidocaine was used for local anesthesia. Following this, a 6 Fr Safe-T-Centesis catheter was introduced. An ultrasound image was saved for documentation purposes. The paracentesis was performed. The catheter was removed and a dressing was applied. The patient tolerated the procedure well without immediate post procedural complication. Patient received post-procedure intravenous albumin; see nursing notes for details. FINDINGS: A total of approximately 2.4 L of clear yellow fluid was removed. Samples were sent to the laboratory as requested by the clinical team. IMPRESSION: Successful ultrasound-guided paracentesis yielding 2.4 liters of peritoneal fluid. Electronically Signed   By: Inez Catalina M.D.   On: 06/04/2019 16:24   Ct Angio Abd/pel W/ And/or W/o  Result Date: 06/05/2019 CLINICAL DATA:  58 year old female with abdominal distension, abdominal pain and upper GI bleeding secondary to portal hypertension/cirrhosis. EXAM: CTA ABDOMEN AND PELVIS wITHOUT AND WITH CONTRAST TECHNIQUE: Multidetector CT imaging of the abdomen and pelvis was performed using the standard protocol during bolus administration of intravenous contrast. Multiplanar reconstructed images and MIPs were obtained and reviewed to evaluate the vascular anatomy. CONTRAST:  146mL OMNIPAQUE IOHEXOL 350 MG/ML SOLN COMPARISON:  Prior CT scan of the abdomen and pelvis 01/18/2019 FINDINGS: VASCULAR Aorta: Heterogeneous atherosclerotic plaque throughout the abdominal aorta without evidence of aneurysm or dissection. No flow limiting stenosis. Celiac: Minimal atherosclerotic plaque at the origin. The vessel is widely patent. Variant hepatic artery anatomy. The right hepatic artery arises from the SMA. No evidence of aneurysm, significant stenosis or fibromuscular dysplasia. SMA: Replaced right hepatic artery. No significant stenosis, dissection or evidence of fibromuscular dysplasia. Renals: Solitary  renal arteries bilaterally. Irregular mild to moderate narrowing in the mid aspect of both renal arteries may represent underlying vascular plaque or fibromuscular dysplasia. No evidence of aneurysm or dissection. IMA: Patent and unremarkable. Inflow: Bulky calcified atherosclerotic plaque results in moderate stenosis at the origin of the left common iliac artery. No evidence of aneurysm or dissection. The external iliac arteries are relatively spared from disease. Proximal Outflow: Bilateral common femoral and visualized portions of the superficial and profunda femoral arteries are patent without evidence of aneurysm, dissection, vasculitis or significant stenosis. Veins: Small esophageal varices are present. A metallic endoscopic clip is identified at the gastroesophageal junction. No significant gastric varices or evidence of gastro renal shunt. The portal veins are patent. The splenic, SMV and renal veins are patent. The IVC and iliac veins are widely patent. No evidence of thrombosis. A recanalized paraumbilical vein is present. Review of the MIP images confirms the above findings. NON-VASCULAR Lower chest: Segmental atelectasis in the lateral basal segment of the right lower lobe. Calcification present in the subendocardium of the ventricular apex consistent with prior myocardial infarction. The heart is mildly enlarged. No pericardial effusion. No distal esophageal wall thickening. Varices are noted as mentioned above. Hepatobiliary: Overtly cirrhotic liver  with a diffusely nodular contour and relative hypertrophy of the left and caudate lobes with atrophy of the right hepatic lobe. No discrete hepatic lesion identified. No significant biliary ductal dilatation. Mild gallbladder distension. Pancreas: Unremarkable. No pancreatic ductal dilatation or surrounding inflammatory changes. Spleen: Normal in size without focal abnormality. Adrenals/Urinary Tract: The adrenal glands are normal. No evidence of  hydronephrosis, nephrolithiasis or enhancing renal mass. The ureters and bladder are unremarkable. Stomach/Bowel: No evidence of arterial or venous hemorrhage within the large or small bowel at this time. No hemorrhage visible within the stomach. The proximal duodenum is unremarkable save for tandem duodenal diverticula in the horizontal segment. Diffuse mild submucosal edema throughout the small bowel likely representing portal enteropathy. No focal bowel wall thickening or evidence of obstruction. Lymphatic: No suspicious lymphadenopathy. Reproductive: Surgical changes of prior hysterectomy. No adnexal masses. Other: Mild ascites. Musculoskeletal: Extensive skin thickening and strandy infiltration throughout the superficial subcutaneous fat consistent with body wall edema/anasarca. No acute fracture or aggressive appearing lytic or blastic osseous lesion. IMPRESSION: VASCULAR 1. No evidence of active arterial or venous bleeding at this time. 2. Small esophageal varices within endoscopically placed clip present at the gastroesophageal junction. 3. No evidence of gastro renal shunt or isolated gastric varices. 4.  Aortic Atherosclerosis (ICD10-170.0). 5. Bulky calcified plaque results in moderate stenosis at the origin of the left common iliac artery. 6. Mildly irregular fibrofatty atherosclerotic plaque versus fibromuscular dysplasia in the mid segment of the bilateral renal arteries. The degree of stenosis does not appear hemodynamically significant. 7. Mild cardiomegaly. 8. Sequelae of prior small myocardial infarction in the subendocardial lining of the left ventricular apex. NON-VASCULAR 1. Hepatic cirrhosis with portal hypertension and small volume ascites. 2. Marked body wall edema consistent with anasarca. 3. Diffuse mild submucosal edema throughout the small bowel consistent with portal enteropathy. 4. Segmental atelectasis in the right lower lobe. 5. Additional ancillary findings as above. Electronically  Signed   By: Jacqulynn Cadet M.D.   On: 06/05/2019 09:06   ASSESSMENT AND PLAN:   58 year old female with liver cirrhosis and portal hypertension who presents to the emergency room due to melena and hematemesis.  1.  Upper GI bleed from underlying liver cirrhosis/portal hypertension in the setting of using NSAIDs. The patient has been treated with PPI and octreotide S/p EGD: Grade II and large (> 5 mm) esophageal varices. Portal hypertensive gastropathy. Elective colonoscopy: Pseudomembranous enterocolitis, Blood in the entire examined colon and Non-bleeding internal hemorrhoids per Dr. Alice Reichert. Discontinued octreotide and Protonix drip.  Continue p.o. Protonix twice daily. CTA abdomen:  1. Hepatic cirrhosis with portal hypertension and small volume ascites. 2. Marked body wall edema consistent with anasarca. 3. Diffuse mild submucosal edema throughout the small bowel consistent with portal enteropathy.  2.  Acute on chronic blood loss anemia due to problem #1:  S/p 2 unit PRBC transfusion.  Hemoglobin decreased to 6.3, s/p 1 more unit PRBC transfusion and up to 8.1. follow-up hemoglobin in am.  3.  Portal hypertension due to liver cirrhosis from chronic EtOH: Hold all medications including spironolactone, nadolol and Lasix for now due to low blood pressure and GI blood loss.   4.  EtOH abuse: Patient has not had any alcohol for several months  5.    Tachycardia due to above: Continue telemetry monitoring.  Improved with IV fluid support.    Leukocytosis.  Unclear etiology.  Improving, chest x-ray and urinalysis are unremarkable.  Improving, follow-up CBC.  Liver cirrhosis with ascites.  Abdominal ultrasound:  moderate ascites.  S/p Paracentesis yielding 2.4 liters of peritoneal fluid.  Constipation, possible due to narcotics.  Advised patient avoid narcotics.  laxatives. Tobacco abuse.  Smoking cessation was counseled for 3 to 4 minutes.  The patient refused nicotine patch.  All the records are reviewed and case discussed with Care Management/Social Worker. Management plans discussed with the patient, family and they are in agreement.  CODE STATUS: Full Code  TOTAL TIME TAKING CARE OF THIS PATIENT: 28 minutes.   More than 50% of the time was spent in counseling/coordination of care: YES  POSSIBLE D/C IN 2 DAYS, DEPENDING ON CLINICAL CONDITION.   Demetrios Loll M.D on 06/05/2019 at 1:01 PM  Between 7am to 6pm - Pager - [phone]  After 6pm go to [email or web address] - [facility]

## 2019-06-05 NOTE — Plan of Care (Signed)
  Problem: Education: Goal: Knowledge of General Education information will improve Description: Including pain rating scale, medication(s)/side effects and non-pharmacologic comfort measures Outcome: Progressing   Problem: Health Behavior/Discharge Planning: Goal: Ability to manage health-related needs will improve Outcome: Progressing   Problem: Clinical Measurements: Goal: Ability to maintain clinical measurements within normal limits will improve Outcome: Progressing Goal: Will remain free from infection Outcome: Progressing Note: Remains afebrile Goal: Diagnostic test results will improve Outcome: Progressing Note: H/H 8.5 and 25.9 this morning. Wbc 17.7               Goal: Respiratory complications will improve Outcome: Progressing Goal: Cardiovascular complication will be avoided Outcome: Progressing   Problem: Activity: Goal: Risk for activity intolerance will decrease Outcome: Progressing   Problem: Elimination: Goal: Will not experience complications related to bowel motility Outcome: Progressing Goal: Will not experience complications related to urinary retention Outcome: Progressing   Problem: Pain Managment: Goal: General experience of comfort will improve Outcome: Progressing Note: PRN medications

## 2019-06-05 NOTE — Progress Notes (Signed)
Tap water enema given per md orders, speckled feces returned with the water.  Some of particles were dark in color mixed with some bright red specks.  Will monitor.

## 2019-06-05 NOTE — Progress Notes (Signed)
[facility] Gastroenterology Inpatient Progress Note  Subjective: Patient seen for f/u GI bleed. No recurrent bleed, however, still with obstipation. Patient removed off contact precautions for now. Pathology results from colonoscopy still pending. Patient passing some flatus after dulcolax supp, but no bm's. Reportedly refused enema ordered earlier today.   Objective: Vital signs in last 24 hours: Temp:  [97.7 F (36.5 C)-98.2 F (36.8 C)] 98.1 F (36.7 C) (10/06 0751) Pulse Rate:  [93-105] 99 (10/06 0751) Resp:  [16-18] 17 (10/06 0751) BP: (116-129)/(64-81) 128/77 (10/06 0751) SpO2:  [95 %-100 %] 100 % (10/06 0751) Weight:  [72.8 kg] 72.8 kg (10/06 0420) Blood pressure 128/77, pulse 99, temperature 98.1 F (36.7 C), temperature source Oral, resp. rate 17, height 5\' 8"  (1.727 m), weight 72.8 kg, SpO2 100 %.    Intake/Output from previous day: 10/05 0701 - 10/06 0700 In: 2483.6 [I.V.:1867.6; Blood:616] Out: 500 [Urine:500]  Intake/Output this shift: Total I/O In: 1008.1 [I.V.:1008.1] Out: 400 [Urine:400]   General appearance:  NAD Resp:  Rel CTA, decreased breath sounds in bases. Cardio:  RRR GI:  Distended with fluid wave, moderately tender without rebound. Extremities:  No edema.   Lab Results: Results for orders placed or performed during the hospital encounter of 06/01/19 (from the past 24 hour(s))  Lactate dehydrogenase (pleural or peritoneal fluid)     Status: Abnormal   Collection Time: 06/04/19  1:50 PM  Result Value Ref Range   LD, Fluid 29 (H) 3 - 23 U/L   Fluid Type-FLDH PERITONEAL   Body fluid cell count with differential     Status: Abnormal   Collection Time: 06/04/19  1:50 PM  Result Value Ref Range   Fluid Type-FCT PERITONEAL    Color, Fluid YELLOW (A) YELLOW   Appearance, Fluid HAZY (A) CLEAR   Total Nucleated Cell Count, Fluid 1,690 cu mm   Neutrophil Count, Fluid 71 %   Lymphs, Fluid 5 %   Monocyte-Macrophage-Serous Fluid 24 %   Eos, Fluid  0 %   Other Cells, Fluid 0 %  Body fluid culture     Status: None (Preliminary result)   Collection Time: 06/04/19  1:50 PM   Specimen: PATH Cytology Peritoneal fluid  Result Value Ref Range   Specimen Description      PERITONEAL Performed at [facility], [address], [place], [place] [address]    Special Requests      NONE Performed at [facility], [place], [place] [address]    Gram Stain      RARE WBC PRESENT, PREDOMINANTLY PMN NO ORGANISMS SEEN    Culture      NO GROWTH < 24 HOURS Performed at [facility], [place] [address], [place], [place] [address]    Report Status PENDING   Albumin, pleural or peritoneal fluid     Status: None   Collection Time: 06/04/19  1:50 PM  Result Value Ref Range   Albumin, Fluid <1.0 g/dL   Fluid Type-FALB PERITONEAL   Protein, pleural or peritoneal fluid     Status: None   Collection Time: 06/04/19  1:50 PM  Result Value Ref Range   Total protein, fluid <3.0 g/dL   Fluid Type-FTP PERITONEAL   Glucose, pleural or peritoneal fluid     Status: None   Collection Time: 06/04/19  1:50 PM  Result Value Ref Range   Glucose, Fluid 129 mg/dL   Fluid Type-FGLU PERITONEAL   Amylase, pleural or peritoneal fluid  Status: None   Collection Time: 06/04/19  1:50 PM  Result Value Ref Range   Amylase, Fluid 8 U/L   Fluid Type-FAMY PERITONEAL   Hemoglobin     Status: Abnormal   Collection Time: 06/05/19 12:17 AM  Result Value Ref Range   Hemoglobin 7.8 (L) 12.0 - 15.0 g/dL  CBC     Status: Abnormal   Collection Time: 06/05/19  4:29 AM  Result Value Ref Range   WBC 17.7 (H) 4.0 - 10.5 K/uL   RBC 3.11 (L) 3.87 - 5.11 MIL/uL   Hemoglobin 8.1 (L) 12.0 - 15.0 g/dL   HCT 25.9 (L) 36.0 - 46.0 %   MCV 83.3 80.0 - 100.0 fL   MCH 26.0 26.0 - 34.0 pg   MCHC 31.3 30.0 - 36.0 g/dL   RDW 21.2 (H) 11.5 - 15.5 %   Platelets 203 150 - 400 K/uL   nRBC 0.8 (H) 0.0 - 0.2 %     Recent Labs    06/03/19 0651  06/04/19 0616 06/05/19 0017 06/05/19 0429  WBC 27.3* 15.0*  --  17.7*  HGB 7.9* 6.3* 7.8* 8.1*  HCT 24.2* 20.5*  --  25.9*  PLT 251 201  --  203   BMET Recent Labs    06/03/19 0651  NA 136  K 3.6  CL 106  CO2 22  GLUCOSE 127*  BUN 22*  CREATININE 0.57  CALCIUM 8.4*   LFT Recent Labs    06/03/19 0651  PROT 6.0*  ALBUMIN 3.7  AST 58*  ALT 28  ALKPHOS 232*  BILITOT 2.6*   PT/INR No results for input(s): LABPROT, INR in the last 72 hours. Hepatitis Panel No results for input(s): HEPBSAG, HCVAB, HEPAIGM, HEPBIGM in the last 72 hours. C-Diff No results for input(s): CDIFFTOX in the last 72 hours. No results for input(s): CDIFFPCR in the last 72 hours.   Studies/Results: US Abdomen Limited  Result Date: 06/04/2019 CLINICAL DATA:  Increased abdominal distention for 3 weeks. Abdominal pain. Cirrhosis. EXAM: LIMITED ABDOMEN ULTRASOUND FOR ASCITES TECHNIQUE: Limited ultrasound survey for ascites was performed in all four abdominal quadrants. COMPARISON:  None. FINDINGS: Moderate amount of ascites is seen in all 4 abdominal quadrants. IMPRESSION: Moderate ascites. Electronically Signed   By: Marlaine Hind M.D.   On: 06/04/2019 09:43   US Paracentesis  Result Date: 06/04/2019 INDICATION: Recurrent ascites EXAM: ULTRASOUND GUIDED PARACENTESIS MEDICATIONS: None. COMPLICATIONS: None immediate. PROCEDURE: Informed written consent was obtained from the patient after a discussion of the risks, benefits and alternatives to treatment. A timeout was performed prior to the initiation of the procedure. Initial ultrasound scanning demonstrates a moderate amount of ascites within the right lower abdominal quadrant. The right lower abdomen was prepped and draped in the usual sterile fashion. 1% lidocaine was used for local anesthesia. Following this, a 6 Fr Safe-T-Centesis catheter was introduced. An ultrasound image was saved for documentation purposes. The paracentesis was performed. The  catheter was removed and a dressing was applied. The patient tolerated the procedure well without immediate post procedural complication. Patient received post-procedure intravenous albumin; see nursing notes for details. FINDINGS: A total of approximately 2.4 L of clear yellow fluid was removed. Samples were sent to the laboratory as requested by the clinical team. IMPRESSION: Successful ultrasound-guided paracentesis yielding 2.4 liters of peritoneal fluid. Electronically Signed   By: Inez Catalina M.D.   On: 06/04/2019 16:24   Dg Chest Port 1 View  Result Date: 06/03/2019 CLINICAL DATA:  Leukocytosis.  Colonoscopy  this morning.  GI bleed. EXAM: PORTABLE CHEST 1 VIEW COMPARISON:  01/30/2019 FINDINGS: Lungs are adequately inflated without lobar consolidation or effusion. Cardiomediastinal silhouette and remainder of the exam is unchanged. IMPRESSION: No acute cardiopulmonary disease. Electronically Signed   By: Marin Olp M.D.   On: 06/03/2019 16:44   Ct Angio Abd/pel W/ And/or W/o  Result Date: 06/05/2019 CLINICAL DATA:  58 year old female with abdominal distension, abdominal pain and upper GI bleeding secondary to portal hypertension/cirrhosis. EXAM: CTA ABDOMEN AND PELVIS wITHOUT AND WITH CONTRAST TECHNIQUE: Multidetector CT imaging of the abdomen and pelvis was performed using the standard protocol during bolus administration of intravenous contrast. Multiplanar reconstructed images and MIPs were obtained and reviewed to evaluate the vascular anatomy. CONTRAST:  162mL OMNIPAQUE IOHEXOL 350 MG/ML SOLN COMPARISON:  Prior CT scan of the abdomen and pelvis 01/18/2019 FINDINGS: VASCULAR Aorta: Heterogeneous atherosclerotic plaque throughout the abdominal aorta without evidence of aneurysm or dissection. No flow limiting stenosis. Celiac: Minimal atherosclerotic plaque at the origin. The vessel is widely patent. Variant hepatic artery anatomy. The right hepatic artery arises from the SMA. No evidence of  aneurysm, significant stenosis or fibromuscular dysplasia. SMA: Replaced right hepatic artery. No significant stenosis, dissection or evidence of fibromuscular dysplasia. Renals: Solitary renal arteries bilaterally. Irregular mild to moderate narrowing in the mid aspect of both renal arteries may represent underlying vascular plaque or fibromuscular dysplasia. No evidence of aneurysm or dissection. IMA: Patent and unremarkable. Inflow: Bulky calcified atherosclerotic plaque results in moderate stenosis at the origin of the left common iliac artery. No evidence of aneurysm or dissection. The external iliac arteries are relatively spared from disease. Proximal Outflow: Bilateral common femoral and visualized portions of the superficial and profunda femoral arteries are patent without evidence of aneurysm, dissection, vasculitis or significant stenosis. Veins: Small esophageal varices are present. A metallic endoscopic clip is identified at the gastroesophageal junction. No significant gastric varices or evidence of gastro renal shunt. The portal veins are patent. The splenic, SMV and renal veins are patent. The IVC and iliac veins are widely patent. No evidence of thrombosis. A recanalized paraumbilical vein is present. Review of the MIP images confirms the above findings. NON-VASCULAR Lower chest: Segmental atelectasis in the lateral basal segment of the right lower lobe. Calcification present in the subendocardium of the ventricular apex consistent with prior myocardial infarction. The heart is mildly enlarged. No pericardial effusion. No distal esophageal wall thickening. Varices are noted as mentioned above. Hepatobiliary: Overtly cirrhotic liver with a diffusely nodular contour and relative hypertrophy of the left and caudate lobes with atrophy of the right hepatic lobe. No discrete hepatic lesion identified. No significant biliary ductal dilatation. Mild gallbladder distension. Pancreas: Unremarkable. No  pancreatic ductal dilatation or surrounding inflammatory changes. Spleen: Normal in size without focal abnormality. Adrenals/Urinary Tract: The adrenal glands are normal. No evidence of hydronephrosis, nephrolithiasis or enhancing renal mass. The ureters and bladder are unremarkable. Stomach/Bowel: No evidence of arterial or venous hemorrhage within the large or small bowel at this time. No hemorrhage visible within the stomach. The proximal duodenum is unremarkable save for tandem duodenal diverticula in the horizontal segment. Diffuse mild submucosal edema throughout the small bowel likely representing portal enteropathy. No focal bowel wall thickening or evidence of obstruction. Lymphatic: No suspicious lymphadenopathy. Reproductive: Surgical changes of prior hysterectomy. No adnexal masses. Other: Mild ascites. Musculoskeletal: Extensive skin thickening and strandy infiltration throughout the superficial subcutaneous fat consistent with body wall edema/anasarca. No acute fracture or aggressive appearing lytic or blastic osseous  lesion. IMPRESSION: VASCULAR 1. No evidence of active arterial or venous bleeding at this time. 2. Small esophageal varices within endoscopically placed clip present at the gastroesophageal junction. 3. No evidence of gastro renal shunt or isolated gastric varices. 4.  Aortic Atherosclerosis (ICD10-170.0). 5. Bulky calcified plaque results in moderate stenosis at the origin of the left common iliac artery. 6. Mildly irregular fibrofatty atherosclerotic plaque versus fibromuscular dysplasia in the mid segment of the bilateral renal arteries. The degree of stenosis does not appear hemodynamically significant. 7. Mild cardiomegaly. 8. Sequelae of prior small myocardial infarction in the subendocardial lining of the left ventricular apex. NON-VASCULAR 1. Hepatic cirrhosis with portal hypertension and small volume ascites. 2. Marked body wall edema consistent with anasarca. 3. Diffuse mild  submucosal edema throughout the small bowel consistent with portal enteropathy. 4. Segmental atelectasis in the right lower lobe. 5. Additional ancillary findings as above. Electronically Signed   By: Jacqulynn Cadet M.D.   On: 06/05/2019 09:06    Scheduled Inpatient Medications:   . sodium chloride   Intravenous Once  . pantoprazole  40 mg Oral BID    Continuous Inpatient Infusions:   . sodium chloride 100 mL/hr at 06/05/19 1053    PRN Inpatient Medications:  bisacodyl, metoprolol tartrate, morphine injection, ondansetron **OR** ondansetron (ZOFRAN) IV, oxyCODONE, polyethylene glycol    Assessment:  1. Esophageal varices - Stable, not bleeding. No endoscopic hemostasis with banding, injection, etc. Needed on this hospital admission.  2. Acute hemorrhagic colitis - improving clinically. CT pending. CTA negative for any ischemic event or active GI bleeding. Biopsies still pending.  3. Obstipation symptoms. - getting suppositories and enemas.   4. Hx of narcotic analgesic use which can exacerbate #3.  5. Alcoholism, presumably in remission for several months.   4. Child Pugh Class "C" cirrhosis  Plan:  1. Continue laxatives. Monitor  2. Dulcolax supp and enemas. Stop Linzess.  3. Miralax    K. Alice Reichert, M.D. 06/05/2019, 12:56 PM

## 2019-06-05 NOTE — Plan of Care (Signed)
  Problem: Clinical Measurements: Goal: Respiratory complications will improve Outcome: Progressing Note: On room air   Problem: Activity: Goal: Risk for activity intolerance will decrease Outcome: Progressing Note: Up independently to [facility] tolerating well   Problem: Nutrition: Goal: Adequate nutrition will be maintained Outcome: Progressing   Problem: Coping: Goal: Level of anxiety will decrease Outcome: Progressing   Problem: Elimination: Goal: Will not experience complications related to urinary retention Outcome: Progressing   Problem: Safety: Goal: Ability to remain free from injury will improve Outcome: Progressing   Problem: Skin Integrity: Goal: Risk for impaired skin integrity will decrease Outcome: Progressing   Problem: Pain Managment: Goal: General experience of comfort will improve Outcome: Not Progressing Note: Continues to complain of abdomen pain despite getting morphine and oxycodone around the clock

## 2019-06-05 NOTE — Progress Notes (Signed)
PT Cancellation Note  Patient Details Name: [name] MRN: [id number] DOB: 1960-10-19   Cancelled Treatment:    Reason Eval/Treat Not Completed: PT screened, no needs identified, will sign off.  Order received and chart reviewed.  Pt independent with all activity and reports function is at baseline.  No assistive equipment needed at this time.  Will complete order.  Please consult if future need arises.  Roxanne Gates, PT, DPT  Roxanne Gates 06/05/2019, 9:31 AM

## 2019-06-06 LAB — CBC
HCT: 26.5 % — ABNORMAL LOW (ref 36.0–46.0)
Hemoglobin: 8 g/dL — ABNORMAL LOW (ref 12.0–15.0)
MCH: 26.3 pg (ref 26.0–34.0)
MCHC: 30.2 g/dL (ref 30.0–36.0)
MCV: 87.2 fL (ref 80.0–100.0)
Platelets: 204 10*3/uL (ref 150–400)
RBC: 3.04 MIL/uL — ABNORMAL LOW (ref 3.87–5.11)
RDW: 22.7 % — ABNORMAL HIGH (ref 11.5–15.5)
WBC: 22 10*3/uL — ABNORMAL HIGH (ref 4.0–10.5)
nRBC: 0.2 % (ref 0.0–0.2)

## 2019-06-06 LAB — CYTOLOGY - NON PAP

## 2019-06-06 LAB — PROTEIN, BODY FLUID (OTHER): Total Protein, Body Fluid Other: 0.6 g/dL

## 2019-06-06 LAB — PH, BODY FLUID: pH, Body Fluid: 7.6

## 2019-06-06 MED ORDER — ALBUMIN HUMAN 25 % IV SOLN
12.5000 g | Freq: Once | INTRAVENOUS | Status: AC
  Administered 2019-06-07: 13:00:00 12.5 g via INTRAVENOUS
  Filled 2019-06-06: qty 50

## 2019-06-06 MED ORDER — SODIUM CHLORIDE 0.9 % IV SOLN
2.0000 g | INTRAVENOUS | Status: DC
  Administered 2019-06-06 – 2019-06-09 (×4): 2 g via INTRAVENOUS
  Filled 2019-06-06: qty 20
  Filled 2019-06-06: qty 2
  Filled 2019-06-06: qty 20
  Filled 2019-06-06 (×2): qty 2

## 2019-06-06 MED ORDER — PROPRANOLOL HCL ER 60 MG PO CP24
60.0000 mg | ORAL_CAPSULE | Freq: Every day | ORAL | Status: DC
  Administered 2019-06-06 – 2019-06-10 (×4): 60 mg via ORAL
  Filled 2019-06-06 (×5): qty 1

## 2019-06-06 NOTE — Progress Notes (Signed)
[facility] [place] NAME: Anita Reed    MR#:  [id number]  DATE OF BIRTH:  27-Apr-1961  SUBJECTIVE:  CHIEF COMPLAINT:   Chief Complaint  Patient presents with  . Abdominal Pain  . GI Bleeding   The patient complains of abdominal pain and requested pain medication continuously.  She had 3 loose BM last night.  REVIEW OF SYSTEMS:  Review of Systems  Constitutional: Positive for malaise/fatigue. Negative for chills and fever.  HENT: Negative for sore throat.   Eyes: Negative for blurred vision and double vision.  Respiratory: Negative for cough, hemoptysis, shortness of breath, wheezing and stridor.   Cardiovascular: Negative for chest pain, palpitations, orthopnea and leg swelling.  Gastrointestinal: Positive for constipation. Negative for abdominal pain, blood in stool, diarrhea, melena, nausea and vomiting.  Genitourinary: Negative for dysuria, flank pain and hematuria.  Musculoskeletal: Negative for back pain and joint pain.  Skin: Negative for rash.  Neurological: Negative for dizziness, sensory change, focal weakness, seizures, loss of consciousness, weakness and headaches.  Endo/Heme/Allergies: Negative for polydipsia.  Psychiatric/Behavioral: Negative for depression. The patient is not nervous/anxious.     DRUG ALLERGIES:  No Known Allergies VITALS:  Blood pressure (!) 99/58, pulse 88, temperature 97.6 F (36.4 C), temperature source Oral, resp. rate 19, height 5\' 8"  (1.727 m), weight 75.3 kg, SpO2 97 %. PHYSICAL EXAMINATION:  Physical Exam Constitutional:      General: She is not in acute distress. HENT:     Head: Normocephalic.     Mouth/Throat:     Mouth: Mucous membranes are moist.  Eyes:     General: No scleral icterus.    Conjunctiva/sclera: Conjunctivae normal.     Pupils: Pupils are equal, round, and reactive to light.  Neck:     Musculoskeletal: Normal range of motion and neck supple.     Vascular: No  JVD.     Trachea: No tracheal deviation.  Cardiovascular:     Rate and Rhythm: Normal rate and regular rhythm.     Heart sounds: Normal heart sounds. No murmur. No gallop.   Pulmonary:     Effort: Pulmonary effort is normal. No respiratory distress.     Breath sounds: Normal breath sounds. No wheezing or rales.  Abdominal:     General: Bowel sounds are normal. There is no distension.     Palpations: Abdomen is soft.     Tenderness: There is abdominal tenderness. There is no rebound.  Musculoskeletal: Normal range of motion.        General: No tenderness.     Right lower leg: No edema.     Left lower leg: No edema.  Skin:    Findings: No erythema or rash.  Neurological:     General: No focal deficit present.     Mental Status: She is alert and oriented to person, place, and time.     Cranial Nerves: No cranial nerve deficit.  Psychiatric:        Mood and Affect: Mood normal.    LABORATORY PANEL:  Female CBC Recent Labs  Lab 06/06/19 0447  WBC 22.0*  HGB 8.0*  HCT 26.5*  PLT 204   ------------------------------------------------------------------------------------------------------------------ Chemistries  Recent Labs  Lab 06/02/19 1100 06/03/19 0651  NA  --  136  K  --  3.6  CL  --  106  CO2  --  22  GLUCOSE  --  127*  BUN  --  22*  CREATININE  --  0.57  CALCIUM  --  8.4*  MG 1.8  --   AST  --  58*  ALT  --  28  ALKPHOS  --  232*  BILITOT  --  2.6*   RADIOLOGY:  No results found. ASSESSMENT AND PLAN:   58 year old female with liver cirrhosis and portal hypertension who presents to the emergency room due to melena and hematemesis.  1.  Upper GI bleed from underlying liver cirrhosis/portal hypertension in the setting of using NSAIDs. The patient has been treated with PPI and octreotide S/p EGD: Grade II and large (> 5 mm) esophageal varices. Portal hypertensive gastropathy. Elective colonoscopy: Pseudomembranous enterocolitis, Blood in the entire examined  colon and Non-bleeding internal hemorrhoids per Dr. Alice Reichert. Given octreotide and Protonix drip. Continue p.o. Protonix twice daily. CTA abdomen:  1. Hepatic cirrhosis with portal hypertension and small volume ascites. 2. Marked body wall edema consistent with anasarca. 3. Diffuse mild submucosal edema throughout the small bowel consistent with portal enteropathy.  2.  Acute on chronic blood loss anemia due to problem #1:  S/p 2 unit PRBC transfusion.  Hemoglobin decreased to 6.3, s/p 1 more unit PRBC transfusion and up to 8.1. follow-up hemoglobin in am.   3.  Portal hypertension due to liver cirrhosis from chronic EtOH: Hold all medications including spironolactone, nadolol and Lasix for now due to low blood pressure and GI blood loss.  start small dose propranolol now as BP stabilized.  4.  EtOH abuse: Patient has not had any alcohol for several months  5.    Tachycardia due to above: Continue telemetry monitoring.  Improved with IV fluid support.    6. Leukocytosis.  Unclear etiology.  Improving, chest x-ray and urinalysis are unremarkable.  Improving, follow-up CBC.  7. Liver cirrhosis with ascites.  Abdominal ultrasound: moderate ascites.  S/p Paracentesis yielding 2.4 liters of peritoneal fluid.  Had 1700 WBCs in fluid, given rocephin on 10/2 and 10/4- spoke to GI, give one dose today adn may do paracentesis tomorrow.  Constipation, possible due to narcotics.  Advised patient avoid narcotics.  Laxatives.     Now have losoe stools, hold laxatives.  Tobacco abuse.  Smoking cessation was counseled for 3 to 4 minutes.  The patient refused nicotine patch.  All the records are reviewed and case discussed with Care Management/Social Worker. Management plans discussed with the patient, family and they are in agreement.  CODE STATUS: Full Code  TOTAL TIME TAKING CARE OF THIS PATIENT: 32 minutes.   More than 50% of the time was spent in counseling/coordination of care: YES   POSSIBLE D/C IN 2 DAYS, DEPENDING ON CLINICAL CONDITION.   Vaughan Basta M.D on 06/06/2019 at 4:02 PM  Between 7am to 6pm - Pager - [phone]  After 6pm go to [email or web address] - [facility]

## 2019-06-06 NOTE — Care Management Important Message (Signed)
Important Message  Patient Details  Name: Anita Reed MRN: [id number] Date of Birth: 1961/06/07   Medicare Important Message Given:  Yes     Dannette Barbara 06/06/2019, 11:38 AM

## 2019-06-06 NOTE — Progress Notes (Signed)
PHARMACY NOTE -  ANTIBIOTIC  DOSE ADJUSTMENT   Patient is ordered Ceftriaxone 1g IV every 24 hours    Per P&T Committee, the dose of cefTRIAXone should be increased to high dose - 2 gm IV q 24 hours for the following conditions:   Bacteremia  Endocarditis  Intra-abdominal infection  Osteomyelitis   Dose has been changed to Ceftriaxone 2g IV every 24 hours.   Pernell Dupre, PharmD, BCPS Clinical Pharmacist 06/06/2019 9:05 AM

## 2019-06-06 NOTE — Progress Notes (Signed)
GI Inpatient Follow-up Note  Subjective:  Patient seen in f/u for GI bleed. Pathology results from colonoscopy are still pending. Patient has had several loose BMs today. She had CTA of her abdomen which showed no evidence of active arterial or venous bleeding, diffuse mild submucosal edema throughout the small bowel consistent with portal enteropathy. She has received Ceftriaxone 2g IV for treatment of SBP. She has requested pain medication continuously. She reports her abdominal pain is the same as it was yesterday. There has been no recurrent nausea, vomiting, or rectal bleeding. She reports she is ready to go home, but understands the severity of her condition. She doesn't want to go home too soon.   Scheduled Inpatient Medications:  . sodium chloride   Intravenous Once  . pantoprazole  40 mg Oral BID  . propranolol ER  60 mg Oral Daily    Continuous Inpatient Infusions:   . sodium chloride 100 mL/hr at 06/06/19 0958  . cefTRIAXone (ROCEPHIN)  IV 2 g (06/06/19 0959)    PRN Inpatient Medications:  bisacodyl, metoprolol tartrate, morphine injection, ondansetron **OR** ondansetron (ZOFRAN) IV, oxyCODONE, polyethylene glycol  Review of Systems: Constitutional: Weight is stable.  Eyes: No changes in vision. ENT: No oral lesions, sore throat.  GI: see HPI.  Heme/Lymph: No easy bruising.  CV: No chest pain.  GU: No hematuria.  Integumentary: No rashes.  Neuro: No headaches.  Psych: No depression/anxiety.  Endocrine: No heat/cold intolerance.  Allergic/Immunologic: No urticaria.  Resp: No cough, SOB.  Musculoskeletal: No joint swelling.    Physical Examination: BP (!) 99/58 (BP Location: Left Arm)   Pulse 88   Temp 97.6 F (36.4 C) (Oral)   Resp 19   Ht 5\' 8"  (1.727 m)   Wt 75.3 kg   SpO2 97%   BMI 25.23 kg/m  Gen: NAD, alert and oriented x 4 HEENT: PEERLA, EOMI, Neck: supple, no JVD or thyromegaly Chest: CTA bilaterally, no wheezes, crackles, or other adventitious  sounds CV: RRR, no m/g/c/r Abd: soft, tenderness to palpation diffusely across abdomen, mildly distended, +BS in all four quadrants; no HSM, guarding, ridigity, or rebound tenderness, +ascites Ext: no edema, well perfused with 2+ pulses, Skin: no rash or lesions noted Lymph: no LAD  Data: Lab Results  Component Value Date   WBC 22.0 (H) 06/06/2019   HGB 8.0 (L) 06/06/2019   HCT 26.5 (L) 06/06/2019   MCV 87.2 06/06/2019   PLT 204 06/06/2019   Recent Labs  Lab 06/05/19 0017 06/05/19 0429 06/06/19 0447  HGB 7.8* 8.1* 8.0*   Lab Results  Component Value Date   NA 136 06/03/2019   K 3.6 06/03/2019   CL 106 06/03/2019   CO2 22 06/03/2019   BUN 22 (H) 06/03/2019   CREATININE 0.57 06/03/2019   Lab Results  Component Value Date   ALT 28 06/03/2019   AST 58 (H) 06/03/2019   ALKPHOS 232 (H) 06/03/2019   BILITOT 2.6 (H) 06/03/2019   Recent Labs  Lab 06/01/19 1251  APTT 29  INR 1.4*   Colon Pathology 06/03/2019: DIAGNOSIS:  A. COLON, RIGHT; COLD BIOPSY:  - ACTIVE COLITIS WITH SMALL FRAGMENTS OF INFLAMMATORY EXUDATE.  - SEE COMMENT.   B. COLON, DISTAL SIGMOID; COLD BIOPSY:  - PATCHY MILD TO MODERATE ACTIVE COLITIS.  - SEE COMMENT.    Comment:  A) Sections demonstrate benign colonic mucosa with moderate to severe active colitis. There are small patches of detached inflammatory exude.   B) Sections demonstrate patchy mild to moderate  active colitis. Well-developed pseudomembranes are not identified in either sample; however, if there is clinical concern for C. difficile colitis, additional testing methods (antigen detection, PCR, culture, etc.) may be helpful. There is no evidence of viral cytopathic effect, chronicity, dysplasia, or malignancy. Features of ischemia are not  identified. Results of special stains will be reported in an addendum.     Assessment: 1. Esophageal varices - Stable, not bleeding. No endoscopic hemostasis with banding, injection, etc.  Needed on this hospital admission.  2. Acute hemorrhagic colitis - improving clinically. CT pending. CTA negative for any ischemic event or active GI bleeding. Biopsies from colonoscopy showing right colon and distal sigmoid biopsies with active colitis with small fragments of inflammatory exudate and mild to moderate active colitis, respectively.   3. Obstipation symptoms. - resolved, having loose BMs   4. Hx of narcotic analgesic use which can exacerbate #3.  5. Alcoholism, presumably in remission for several months.   6. Child Pugh Class "C" cirrhosis  7. SBP - On Ceftriaxone 2g IV  Plan: 1. Plan for repeat diagnostic paracentesis tomorrow with cell count, diff, culture to recheck PMN count   2. Continue Ceftriaxone 2g IV q24 hours. She should complete a total of 7 days of antibiotic therapy.   3. We will continue to follow along   Please call with questions or concerns.    Octavia Bruckner, PA-C [facility] [phone] (Cell)

## 2019-06-07 ENCOUNTER — Inpatient Hospital Stay: Payer: Medicare Other

## 2019-06-07 LAB — COMPREHENSIVE METABOLIC PANEL
ALT: 23 U/L (ref 0–44)
AST: 38 U/L (ref 15–41)
Albumin: 2.9 g/dL — ABNORMAL LOW (ref 3.5–5.0)
Alkaline Phosphatase: 202 U/L — ABNORMAL HIGH (ref 38–126)
Anion gap: 9 (ref 5–15)
BUN: 10 mg/dL (ref 6–20)
CO2: 22 mmol/L (ref 22–32)
Calcium: 8.1 mg/dL — ABNORMAL LOW (ref 8.9–10.3)
Chloride: 108 mmol/L (ref 98–111)
Creatinine, Ser: 0.43 mg/dL — ABNORMAL LOW (ref 0.44–1.00)
GFR calc Af Amer: >60 mL/min (ref >60)
GFR calc non Af Amer: >60 mL/min (ref >60)
Glucose, Bld: 97 mg/dL (ref 70–99)
Potassium: 2.9 mmol/L — ABNORMAL LOW (ref 3.5–5.1)
Sodium: 139 mmol/L (ref 135–145)
Total Bilirubin: 3 mg/dL — ABNORMAL HIGH (ref 0.3–1.2)
Total Protein: 5.2 g/dL — ABNORMAL LOW (ref 6.5–8.1)

## 2019-06-07 LAB — CBC
HCT: 28.2 % — ABNORMAL LOW (ref 36.0–46.0)
Hemoglobin: 8.8 g/dL — ABNORMAL LOW (ref 12.0–15.0)
MCH: 26.3 pg (ref 26.0–34.0)
MCHC: 31.2 g/dL (ref 30.0–36.0)
MCV: 84.4 fL (ref 80.0–100.0)
Platelets: 254 10*3/uL (ref 150–400)
RBC: 3.34 MIL/uL — ABNORMAL LOW (ref 3.87–5.11)
RDW: 22.9 % — ABNORMAL HIGH (ref 11.5–15.5)
WBC: 28.1 10*3/uL — ABNORMAL HIGH (ref 4.0–10.5)
nRBC: 0.2 % (ref 0.0–0.2)

## 2019-06-07 LAB — BODY FLUID CELL COUNT WITH DIFFERENTIAL
Eos, Fluid: 0 %
Lymphs, Fluid: 0 %
Monocyte-Macrophage-Serous Fluid: 15 %
Neutrophil Count, Fluid: 85 %
Total Nucleated Cell Count, Fluid: 2644 cu mm

## 2019-06-07 LAB — PROTEIN, PLEURAL OR PERITONEAL FLUID: Total protein, fluid: <3 g/dL

## 2019-06-07 LAB — ALBUMIN, PLEURAL OR PERITONEAL FLUID: Albumin, Fluid: <1 g/dL

## 2019-06-07 LAB — GLUCOSE, PLEURAL OR PERITONEAL FLUID: Glucose, Fluid: 97 mg/dL

## 2019-06-07 IMAGING — US US PARACENTESIS
1 series · 5 of 5 positions shown · non-contrast
Comparison: none

INDICATION: Recurrent ascites

[Series 1: us paracentesis · 0.28mm/px · 5 of 5 slices shown]
[im 1/5]
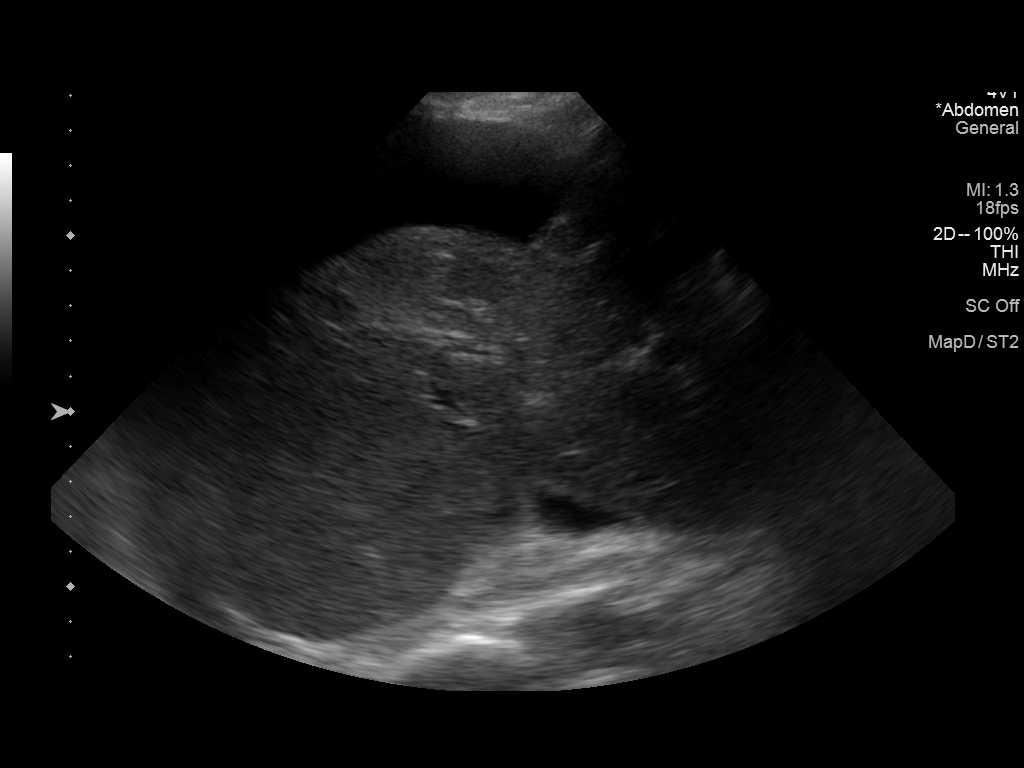
[im 2/5]
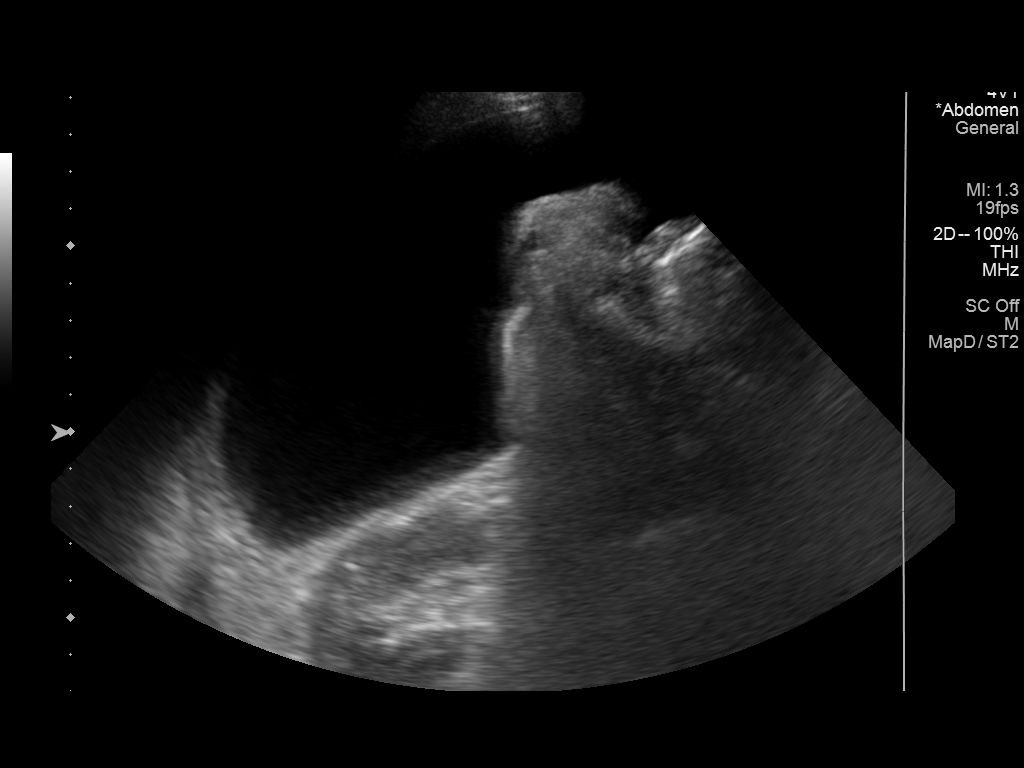
[im 3/5]
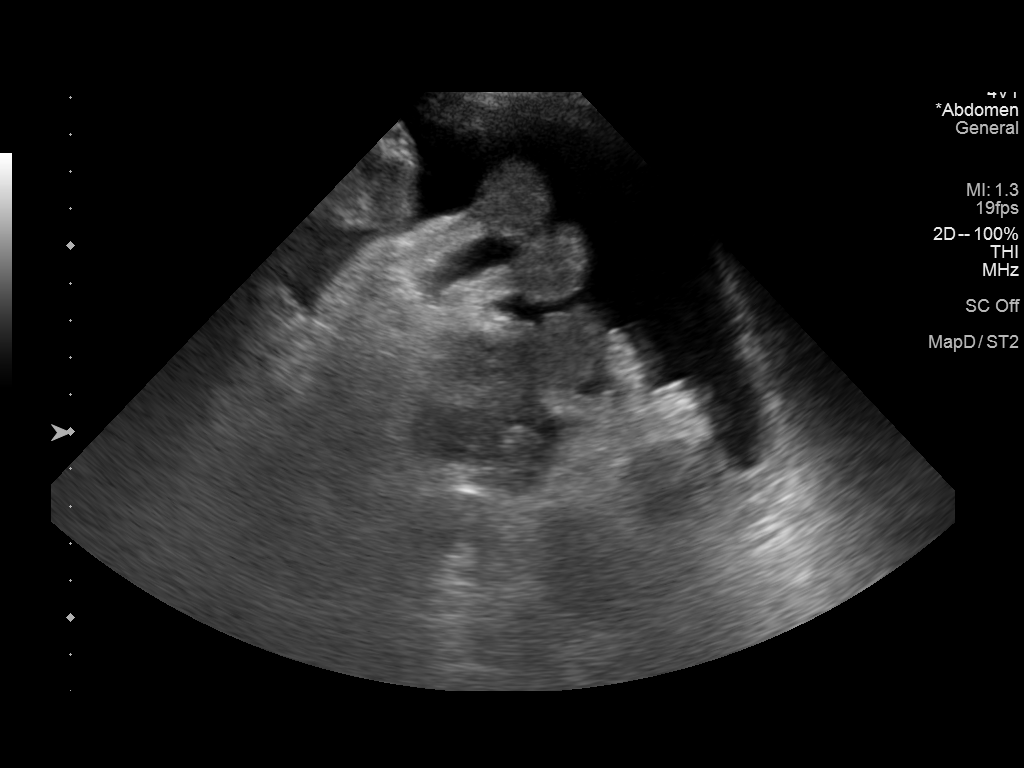
[im 4/5]
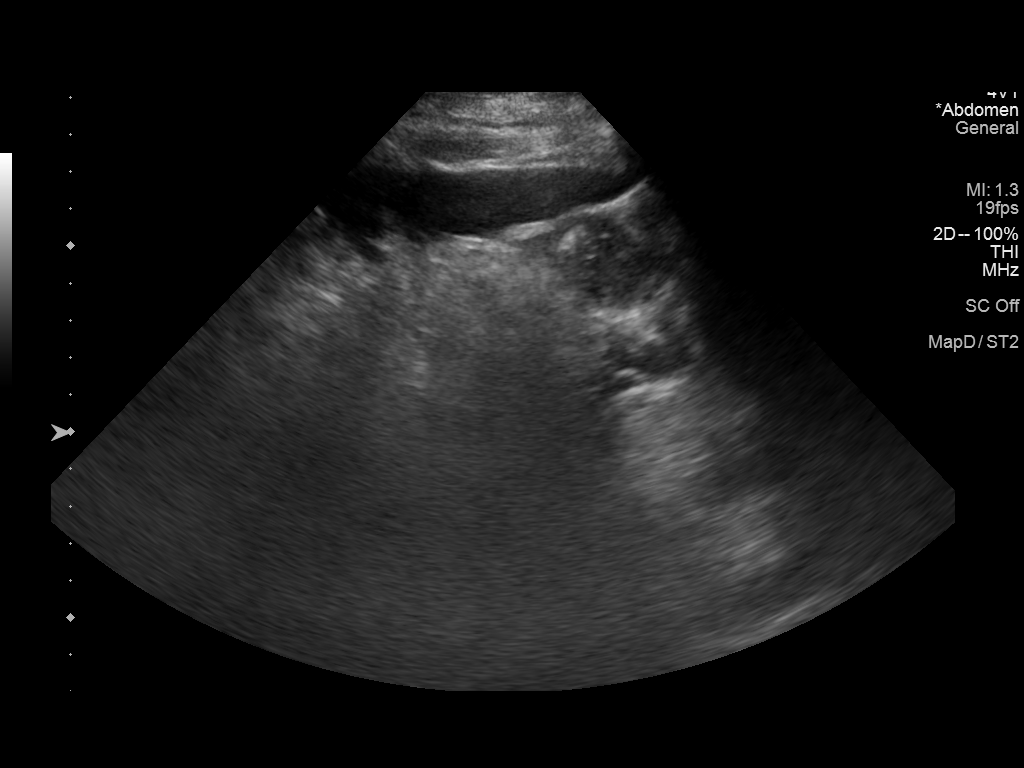
[im 5/5]
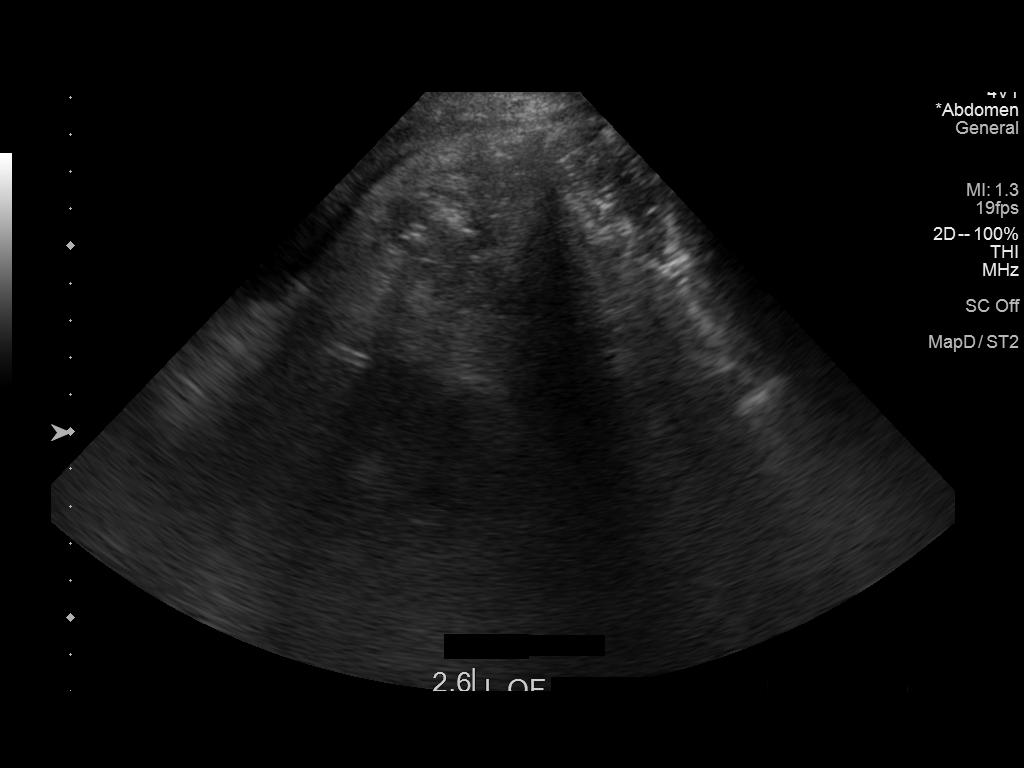

[5 of 5 positions shown; findings below may reference images not displayed]

EXAM:
ULTRASOUND GUIDED  PARACENTESIS

MEDICATIONS:
None.

COMPLICATIONS:
None immediate.

PROCEDURE:
Informed written consent was obtained from the patient after a
discussion of the risks, benefits and alternatives to treatment. A
timeout was performed prior to the initiation of the procedure.

Initial ultrasound scanning demonstrates a moderate amount of
ascites within the right lower abdominal quadrant. The right lower
abdomen was prepped and draped in the usual sterile fashion. 1%
lidocaine was used for local anesthesia.

Following this, a 6 Fr Safe-T-Centesis catheter was introduced under
real-time ultrasound guidance. An ultrasound image was saved for
documentation purposes. The paracentesis was performed. The catheter
was removed and a dressing was applied. The patient tolerated the
procedure well without immediate post procedural complication.
Patient received post-procedure intravenous albumin; see nursing
notes for details.
FINDINGS: A total of approximately 2.6 L of clear yellow fluid was removed.
Samples were sent to the laboratory as requested by the clinical
team.
IMPRESSION: Successful ultrasound-guided paracentesis yielding 2.6 liters of
peritoneal fluid.

## 2019-06-07 MED ORDER — POTASSIUM CHLORIDE IN NACL 40-0.9 MEQ/L-% IV SOLN
INTRAVENOUS | Status: DC
  Administered 2019-06-07 – 2019-06-10 (×5): 75 mL/h via INTRAVENOUS
  Filled 2019-06-07 (×7): qty 1000

## 2019-06-07 MED ORDER — POTASSIUM CHLORIDE IN NACL 40-0.9 MEQ/L-% IV SOLN
INTRAVENOUS | Status: DC
  Administered 2019-06-07: 75 mL/h via INTRAVENOUS
  Filled 2019-06-07: qty 1000

## 2019-06-07 NOTE — Procedures (Signed)
US paracentesis without difficulty  Complications:  None  Blood Loss: none  See dictation in canopy pacs  

## 2019-06-07 NOTE — Progress Notes (Signed)
Ns +40kcl ordered to stop at 1500. Clarified with dr. Anselm Jungling, per md keep fluids continuous do not discontinue

## 2019-06-07 NOTE — Progress Notes (Addendum)
GI Inpatient Follow-up Note  Subjective:  Patient seen in f/u for GI bleed and SBP. Pathology from colonoscopy demonstrated mild to moderate active colitis without well-defined pseudomembranes. She had CTA of her abdomen which showed no evidence of active arterial or venous bleeding, diffuse mild submucosal edema throughout the small bowel consistent with portal enteropathy. She has received Ceftriaxone 2g IV for treatment of SBP, but white count has increased to 28 today. Hgb is stable at 8.8. She has requested pain medication continuously.   She reports her abdominal pain is the same as it was yesterday.  She continues to report several small volume, loose BMs today without frank blood or melena. She wishes she could pass more gas. There has been no recurrent nausea or vomiting. She occasionally feels "feverish", but denies chills or sweats. She is tolerating a diet of fruit, soft foods, and liquids. No new concerns at this time.   Scheduled Inpatient Medications:  . sodium chloride   Intravenous Once  . pantoprazole  40 mg Oral BID  . propranolol ER  60 mg Oral Daily    Continuous Inpatient Infusions:   . 0.9 % NaCl with KCl 40 mEq / L    . cefTRIAXone (ROCEPHIN)  IV Stopped (06/07/19 1226)    PRN Inpatient Medications:  bisacodyl, metoprolol tartrate, morphine injection, ondansetron **OR** ondansetron (ZOFRAN) IV, oxyCODONE, polyethylene glycol  Review of Systems: Constitutional: Weight is stable.  Eyes: No changes in vision. ENT: No oral lesions, sore throat.  GI: see HPI.  Heme/Lymph: No easy bruising.  CV: No chest pain.  GU: No hematuria.  Integumentary: No rashes.  Neuro: No headaches.  Psych: No depression/anxiety.  Endocrine: No heat/cold intolerance.  Allergic/Immunologic: No urticaria.  Resp: No cough, SOB.  Musculoskeletal: No joint swelling.    Physical Examination: BP (!) 100/58 (BP Location: Left Arm)   Pulse 82   Temp 98.1 F (36.7 C) (Oral)   Resp 18    Ht 5\' 8"  (1.727 m)   Wt 76.4 kg   SpO2 100%   BMI 25.61 kg/m  Gen: NAD, alert and oriented x 4 HEENT: PEERLA, EOMI, Neck: supple, no JVD or thyromegaly Chest: CTA bilaterally, no wheezes, crackles, or other adventitious sounds CV: RRR, no m/g/c/r Abd: soft, tenderness to palpation diffusely across abdomen, mildly distended, +BS in all four quadrants; no HSM, guarding, ridigity, or rebound tenderness, +ascites Ext: no edema, well perfused with 2+ pulses, Skin: no rash or lesions noted Lymph: no LAD  Data: Lab Results  Component Value Date   WBC 28.1 (H) 06/07/2019   HGB 8.8 (L) 06/07/2019   HCT 28.2 (L) 06/07/2019   MCV 84.4 06/07/2019   PLT 254 06/07/2019   Recent Labs  Lab 06/05/19 0429 06/06/19 0447 06/07/19 0617  HGB 8.1* 8.0* 8.8*   Lab Results  Component Value Date   NA 139 06/07/2019   K 2.9 (L) 06/07/2019   CL 108 06/07/2019   CO2 22 06/07/2019   BUN 10 06/07/2019   CREATININE 0.43 (L) 06/07/2019   Lab Results  Component Value Date   ALT 23 06/07/2019   AST 38 06/07/2019   ALKPHOS 202 (H) 06/07/2019   BILITOT 3.0 (H) 06/07/2019   Recent Labs  Lab 06/01/19 1251  APTT 29  INR 1.4*   Colon Pathology 06/03/2019: DIAGNOSIS:  A. COLON, RIGHT; COLD BIOPSY:  - ACTIVE COLITIS WITH SMALL FRAGMENTS OF INFLAMMATORY EXUDATE.  - SEE COMMENT.   B. COLON, DISTAL SIGMOID; COLD BIOPSY:  - PATCHY MILD  TO MODERATE ACTIVE COLITIS.  - SEE COMMENT.    Comment:  A) Sections demonstrate benign colonic mucosa with moderate to severe active colitis. There are small patches of detached inflammatory exude.   B) Sections demonstrate patchy mild to moderate active colitis. Well-developed pseudomembranes are not identified in either sample; however, if there is clinical concern for C. difficile colitis, additional testing methods (antigen detection, PCR, culture, etc.) may be helpful. There is no evidence of viral cytopathic effect, chronicity, dysplasia, or malignancy.  Features of ischemia are not  identified. Results of special stains will be reported in an addendum.     Assessment: 1. Esophageal varices - Stable, not bleeding. No endoscopic hemostasis with banding, injection, etc. Needed on this hospital admission.  2. Acute hemorrhagic colitis - improving clinically. CT pending. CTA negative for any ischemic event or active GI bleeding. Biopsies from colonoscopy showing right colon and distal sigmoid biopsies with active colitis with small fragments of inflammatory exudate and mild to moderate active colitis, respectively.   3. Obstipation symptoms. - resolved, having loose BMs. Requesting gas medication prn.  4. Hx of narcotic analgesic use which can exacerbate #3.  5. Alcoholism, presumably in remission for several months.   6. Child Pugh Class "C" cirrhosis  7. SBP - On Ceftriaxone 2g IV. WBCs increased from 22 yesterday > 28 today. Cell count on repeat paracentesis with increased cell count from 1,690 > 2,644, neutophils 71 > 85.  Plan: 1. Continue Ceftriaxone 2g IV q24 hours. She should complete a total of 7 days of antibiotic therapy.   2. Continue to monitor WBCs & Hgb/Hct.  3. Plan for repeat diagnostic paracentesis in ~48 hours to recheck cell count, diff, and culture to recheck PMN count.  4. Simethicone prn gas.  5. We will continue to follow along.  Please call with questions or concerns.    Lavera Guise, PA-C [facility] [phone] (Cell)

## 2019-06-07 NOTE — Progress Notes (Signed)
Made dr. Anselm Jungling aware potassium is 2.9.

## 2019-06-07 NOTE — Plan of Care (Signed)
Complaints of abdominal pain with prn meds given every 4 hours as needed.

## 2019-06-07 NOTE — Progress Notes (Signed)
[place] at [place] NAME: Anita Reed    MR#:  [id number]  DATE OF BIRTH:  [birth year]  SUBJECTIVE:  CHIEF COMPLAINT:   Chief Complaint  Patient presents with   Abdominal Pain   GI Bleeding   The patient complains of abdominal pain and requested pain medication continuously. No diarrhea or blood in BM.  REVIEW OF SYSTEMS:  Review of Systems  Constitutional: Positive for malaise/fatigue. Negative for chills and fever.  HENT: Negative for sore throat.   Eyes: Negative for blurred vision and double vision.  Respiratory: Negative for cough, hemoptysis, shortness of breath, wheezing and stridor.   Cardiovascular: Negative for chest pain, palpitations, orthopnea and leg swelling.  Gastrointestinal: Positive for constipation. Negative for abdominal pain, blood in stool, diarrhea, melena, nausea and vomiting.  Genitourinary: Negative for dysuria, flank pain and hematuria.  Musculoskeletal: Negative for back pain and joint pain.  Skin: Negative for rash.  Neurological: Negative for dizziness, sensory change, focal weakness, seizures, loss of consciousness, weakness and headaches.  Endo/Heme/Allergies: Negative for polydipsia.  Psychiatric/Behavioral: Negative for depression. The patient is not nervous/anxious.     DRUG ALLERGIES:  No Known Allergies VITALS:  Blood pressure (!) 100/58, pulse 82, temperature 98.1 F (36.7 C), temperature source Oral, resp. rate 18, height 5\' 8"  (1.727 m), weight 76.4 kg, SpO2 100 %. PHYSICAL EXAMINATION:  Physical Exam Constitutional:      General: She is not in acute distress. HENT:     Head: Normocephalic.     Mouth/Throat:     Mouth: Mucous membranes are moist.  Eyes:     General: No scleral icterus.    Conjunctiva/sclera: Conjunctivae normal.     Pupils: Pupils are equal, round, and reactive to light.  Neck:     Musculoskeletal: Normal range of motion and neck supple.     Vascular: No JVD.       Trachea: No tracheal deviation.  Cardiovascular:     Rate and Rhythm: Normal rate and regular rhythm.     Heart sounds: Normal heart sounds. No murmur. No gallop.   Pulmonary:     Effort: Pulmonary effort is normal. No respiratory distress.     Breath sounds: Normal breath sounds. No wheezing or rales.  Abdominal:     General: Bowel sounds are normal. There is no distension.     Palpations: Abdomen is soft.     Tenderness: There is abdominal tenderness. There is no rebound.  Musculoskeletal: Normal range of motion.        General: No tenderness.     Right lower leg: No edema.     Left lower leg: No edema.  Skin:    Findings: No erythema or rash.  Neurological:     General: No focal deficit present.     Mental Status: She is alert and oriented to person, place, and time.     Cranial Nerves: No cranial nerve deficit.  Psychiatric:        Mood and Affect: Mood normal.    LABORATORY PANEL:  Female CBC Recent Labs  Lab 06/07/19 0617  WBC 28.1*  HGB 8.8*  HCT 28.2*  PLT 254   ------------------------------------------------------------------------------------------------------------------ Chemistries  Recent Labs  Lab 06/02/19 1100  06/07/19 0617  NA  --    < > 139  K  --    < > 2.9*  CL  --    < > 108  CO2  --    < >  22  GLUCOSE  --    < > 97  BUN  --    < > 10  CREATININE  --    < > 0.43*  CALCIUM  --    < > 8.1*  MG 1.8  --   --   AST  --    < > 38  ALT  --    < > 23  ALKPHOS  --    < > 202*  BILITOT  --    < > 3.0*   < > = values in this interval not displayed.   RADIOLOGY:  US Paracentesis  Result Date: 06/07/2019 INDICATION: Recurrent ascites EXAM: ULTRASOUND GUIDED  PARACENTESIS MEDICATIONS: None. COMPLICATIONS: None immediate. PROCEDURE: Informed written consent was obtained from the patient after a discussion of the risks, benefits and alternatives to treatment. A timeout was performed prior to the initiation of the procedure. Initial ultrasound  scanning demonstrates a moderate amount of ascites within the right lower abdominal quadrant. The right lower abdomen was prepped and draped in the usual sterile fashion. 1% lidocaine was used for local anesthesia. Following this, a 6 Fr Safe-T-Centesis catheter was introduced under real-time ultrasound guidance. An ultrasound image was saved for documentation purposes. The paracentesis was performed. The catheter was removed and a dressing was applied. The patient tolerated the procedure well without immediate post procedural complication. Patient received post-procedure intravenous albumin; see nursing notes for details. FINDINGS: A total of approximately 2.6 L of clear yellow fluid was removed. Samples were sent to the laboratory as requested by the clinical team. IMPRESSION: Successful ultrasound-guided paracentesis yielding 2.6 liters of peritoneal fluid. Electronically Signed   By: Inez Catalina M.D.   On: 06/07/2019 11:37   ASSESSMENT AND PLAN:   58 year old female with liver cirrhosis and portal hypertension who presents to the emergency room due to melena and hematemesis.  1.  Upper GI bleed from underlying liver cirrhosis/portal hypertension in the setting of using NSAIDs. The patient has been treated with PPI and octreotide S/p EGD: Grade II and large (> 5 mm) esophageal varices. Portal hypertensive gastropathy. Elective colonoscopy: Pseudomembranous enterocolitis, Blood in the entire examined colon and Non-bleeding internal hemorrhoids per Dr. Alice Reichert. Given octreotide and Protonix drip. Continue p.o. Protonix twice daily. CTA abdomen:  1. Hepatic cirrhosis with portal hypertension and small volume ascites. 2. Marked body wall edema consistent with anasarca. 3. Diffuse mild submucosal edema throughout the small bowel consistent with portal enteropathy.  2.  Acute on chronic blood loss anemia due to problem #1:  S/p 2 unit PRBC transfusion.  Hemoglobin decreased to 6.3, s/p 1 more unit  PRBC transfusion and up to 8.1. follow-up hemoglobin in am.   3.  Portal hypertension due to liver cirrhosis from chronic EtOH: Hold all medications including spironolactone, nadolol and Lasix for now due to low blood pressure and GI blood loss.  start small dose propranolol now as BP stabilized.  4.  EtOH abuse: Patient has not had any alcohol for several months  5.    Tachycardia due to above: Continue telemetry monitoring.  Improved with IV fluid support.    6. Leukocytosis.  Unclear etiology.  Improving, chest x-ray and urinalysis are unremarkable.  Improving, follow-up CBC.  7. Liver cirrhosis with ascites.  Abdominal ultrasound: moderate ascites.  S/p Paracentesis ( 10/5) yielding 2.4 liters of peritoneal fluid.  Had 1700 WBCs in fluid, given rocephin on 10/2 and 10/4- spoke to GI, started back again on Rocephin. Repeat paracentesis 10/8 -with  again high leukocytes in the fluid. Patient's WBC is on CBC is also going up.  Constipation, possible due to narcotics.  Advised patient avoid narcotics.  Laxatives.     Now have losoe stools, hold laxatives.  Tobacco abuse.  Smoking cessation was counseled for 3 to 4 minutes.  The patient refused nicotine patch.  All the records are reviewed and case discussed with Care Management/Social Worker. Management plans discussed with the patient, family and they are in agreement.  CODE STATUS: Full Code  TOTAL TIME TAKING CARE OF THIS PATIENT: 32 minutes.   More than 50% of the time was spent in counseling/coordination of care: YES  POSSIBLE D/C IN 2 DAYS, DEPENDING ON CLINICAL CONDITION.   Vaughan Basta M.D on 06/07/2019 at 5:11 PM  Between 7am to 6pm - Pager - [phone]  After 6pm go to [email or web address] - [facility]

## 2019-06-07 NOTE — Progress Notes (Signed)
Per dr. Anselm Jungling hold scheduled dose of propanolol due to low bp sbp 94. Will continue to monitor

## 2019-06-08 LAB — CBC
HCT: 25.9 % — ABNORMAL LOW (ref 36.0–46.0)
Hemoglobin: 8.2 g/dL — ABNORMAL LOW (ref 12.0–15.0)
MCH: 26.4 pg (ref 26.0–34.0)
MCHC: 31.7 g/dL (ref 30.0–36.0)
MCV: 83.3 fL (ref 80.0–100.0)
Platelets: 253 10*3/uL (ref 150–400)
RBC: 3.11 MIL/uL — ABNORMAL LOW (ref 3.87–5.11)
RDW: 22.3 % — ABNORMAL HIGH (ref 11.5–15.5)
WBC: 17.8 10*3/uL — ABNORMAL HIGH (ref 4.0–10.5)
nRBC: 0.2 % (ref 0.0–0.2)

## 2019-06-08 LAB — COMPREHENSIVE METABOLIC PANEL
ALT: 22 U/L (ref 0–44)
AST: 34 U/L (ref 15–41)
Albumin: 2.8 g/dL — ABNORMAL LOW (ref 3.5–5.0)
Alkaline Phosphatase: 183 U/L — ABNORMAL HIGH (ref 38–126)
Anion gap: 6 (ref 5–15)
BUN: 10 mg/dL (ref 6–20)
CO2: 22 mmol/L (ref 22–32)
Calcium: 8 mg/dL — ABNORMAL LOW (ref 8.9–10.3)
Chloride: 108 mmol/L (ref 98–111)
Creatinine, Ser: 0.41 mg/dL — ABNORMAL LOW (ref 0.44–1.00)
GFR calc Af Amer: >60 mL/min (ref >60)
GFR calc non Af Amer: >60 mL/min (ref >60)
Glucose, Bld: 144 mg/dL — ABNORMAL HIGH (ref 70–99)
Potassium: 2.9 mmol/L — ABNORMAL LOW (ref 3.5–5.1)
Sodium: 136 mmol/L (ref 135–145)
Total Bilirubin: 2.5 mg/dL — ABNORMAL HIGH (ref 0.3–1.2)
Total Protein: 4.9 g/dL — ABNORMAL LOW (ref 6.5–8.1)

## 2019-06-08 LAB — SURGICAL PATHOLOGY

## 2019-06-08 LAB — MAGNESIUM: Magnesium: 1.7 mg/dL (ref 1.7–2.4)

## 2019-06-08 LAB — BODY FLUID CULTURE: Culture: NO GROWTH

## 2019-06-08 LAB — C DIFFICILE QUICK SCREEN W PCR REFLEX
C Diff antigen: POSITIVE — AB
C Diff toxin: NEGATIVE

## 2019-06-08 LAB — CLOSTRIDIUM DIFFICILE BY PCR, REFLEXED: Toxigenic C. Difficile by PCR: POSITIVE — AB

## 2019-06-08 LAB — CYTOLOGY - NON PAP

## 2019-06-08 MED ORDER — SODIUM CHLORIDE 0.9 % IV SOLN
INTRAVENOUS | Status: DC | PRN
  Administered 2019-06-08: 250 mL via INTRAVENOUS
  Administered 2019-06-10 – 2019-06-11 (×3): 500 mL via INTRAVENOUS

## 2019-06-08 MED ORDER — SODIUM CHLORIDE 0.9% FLUSH
3.0000 mL | Freq: Two times a day (BID) | INTRAVENOUS | Status: DC
  Administered 2019-06-08 – 2019-06-11 (×7): 3 mL via INTRAVENOUS

## 2019-06-08 MED ORDER — POTASSIUM CHLORIDE CRYS ER 20 MEQ PO TBCR
40.0000 meq | EXTENDED_RELEASE_TABLET | Freq: Two times a day (BID) | ORAL | Status: DC
  Administered 2019-06-08 – 2019-06-09 (×4): 40 meq via ORAL
  Filled 2019-06-08 (×5): qty 2

## 2019-06-08 MED ORDER — MAGNESIUM SULFATE IN D5W 1-5 GM/100ML-% IV SOLN
1.0000 g | Freq: Once | INTRAVENOUS | Status: AC
  Administered 2019-06-08: 1 g via INTRAVENOUS
  Filled 2019-06-08: qty 100

## 2019-06-08 MED ORDER — SODIUM CHLORIDE 0.9% FLUSH
3.0000 mL | INTRAVENOUS | Status: DC | PRN
  Administered 2019-06-09: 3 mL via INTRAVENOUS
  Filled 2019-06-08: qty 3

## 2019-06-08 MED ORDER — MAGNESIUM SULFATE 50 % IJ SOLN: 1.0000 g | Freq: Once | INTRAMUSCULAR | Status: DC

## 2019-06-08 NOTE — Plan of Care (Signed)
?  Problem: Clinical Measurements: ?Goal: Will remain free from infection ?Outcome: Progressing ?  ?

## 2019-06-08 NOTE — Progress Notes (Signed)
GI Inpatient Follow-up Note  Subjective:  Patient seen in f/u for GI bleed and SBP. Pathology from colonoscopy demonstrated mild to moderate active colitis without well-defined pseudomembranes. She had CTA of her abdomen which showed no evidence of active arterial or venous bleeding, diffuse mild submucosal edema throughout the small bowel consistent with portal enteropathy. She has received Ceftriaxone 2g IV for treatment of SBP, but white count has increased to 28 today. Hgb is stable at 8.8. She has requested pain medication continuously. She is now on contact precautions due to continued passage of loose stool.  She reports her abdominal pain remains unchanged.  She continues to report several small volume, loose BMs today without frank blood or melena. She wishes she could pass more gas. There has been no recurrent nausea or vomiting. She occasionally feels "feverish", but denies chills or sweats. She is tolerating a diet of fruit, soft foods, and liquids. No new concerns at this time.   Scheduled Inpatient Medications:  . sodium chloride   Intravenous Once  . pantoprazole  40 mg Oral BID  . potassium chloride  40 mEq Oral BID  . propranolol ER  60 mg Oral Daily  . sodium chloride flush  3 mL Intravenous Q12H    Continuous Inpatient Infusions:   . sodium chloride 250 mL (06/08/19 0901)  . 0.9 % NaCl with KCl 40 mEq / L 75 mL/hr (06/08/19 1037)  . cefTRIAXone (ROCEPHIN)  IV 2 g (06/08/19 0904)  . magnesium sulfate bolus IVPB      PRN Inpatient Medications:  sodium chloride, bisacodyl, metoprolol tartrate, morphine injection, ondansetron **OR** ondansetron (ZOFRAN) IV, oxyCODONE, polyethylene glycol, sodium chloride flush  Review of Systems: Constitutional: Weight is stable.  Eyes: No changes in vision. ENT: No oral lesions, sore throat.  GI: see HPI.  Heme/Lymph: No easy bruising.  CV: No chest pain.  GU: No hematuria.  Integumentary: No rashes.  Neuro: No headaches.  Psych:  No depression/anxiety.  Endocrine: No heat/cold intolerance.  Allergic/Immunologic: No urticaria.  Resp: No cough, SOB.  Musculoskeletal: No joint swelling.    Physical Examination: BP 107/67 (BP Location: Right Arm)   Pulse 85   Temp 97.8 F (36.6 C) (Oral)   Resp 19   Ht 5\' 8"  (1.727 m)   Wt 76.4 kg   SpO2 100%   BMI 25.61 kg/m  Gen: NAD, alert and oriented x 4 HEENT: PEERLA, EOMI, Neck: supple, no JVD or thyromegaly Chest: CTA bilaterally, no wheezes, crackles, or other adventitious sounds CV: RRR, no m/g/c/r Abd: soft, tenderness to palpation diffusely across abdomen, mildly distended, +BS in all four quadrants; no HSM, guarding, ridigity, or rebound tenderness, +ascites Ext: no edema, well perfused with 2+ pulses, Skin: no rash or lesions noted Lymph: no LAD  Data: Lab Results  Component Value Date   WBC 17.8 (H) 06/08/2019   HGB 8.2 (L) 06/08/2019   HCT 25.9 (L) 06/08/2019   MCV 83.3 06/08/2019   PLT 253 06/08/2019   Recent Labs  Lab 06/06/19 0447 06/07/19 0617 06/08/19 0431  HGB 8.0* 8.8* 8.2*   Lab Results  Component Value Date   NA 136 06/08/2019   K 2.9 (L) 06/08/2019   CL 108 06/08/2019   CO2 22 06/08/2019   BUN 10 06/08/2019   CREATININE 0.41 (L) 06/08/2019   Lab Results  Component Value Date   ALT 22 06/08/2019   AST 34 06/08/2019   ALKPHOS 183 (H) 06/08/2019   BILITOT 2.5 (H) 06/08/2019   No  results for input(s): APTT, INR, PTT in the last 168 hours. Colon Pathology 06/03/2019: DIAGNOSIS:  A. COLON, RIGHT; COLD BIOPSY:  - ACTIVE COLITIS WITH SMALL FRAGMENTS OF INFLAMMATORY EXUDATE.  - SEE COMMENT.   B. COLON, DISTAL SIGMOID; COLD BIOPSY:  - PATCHY MILD TO MODERATE ACTIVE COLITIS.  - SEE COMMENT.    Comment:  A) Sections demonstrate benign colonic mucosa with moderate to severe active colitis. There are small patches of detached inflammatory exude.   B) Sections demonstrate patchy mild to moderate active colitis. Well-developed  pseudomembranes are not identified in either sample; however, if there is clinical concern for C. difficile colitis, additional testing methods (antigen detection, PCR, culture, etc.) may be helpful. There is no evidence of viral cytopathic effect, chronicity, dysplasia, or malignancy. Features of ischemia are not  identified. Results of special stains will be reported in an addendum.     Assessment: 1. Esophageal varices - Stable, not bleeding. No endoscopic hemostasis with banding, injection, etc. Needed on this hospital admission.  2. Acute hemorrhagic colitis - improving clinically. CT pending. CTA negative for any ischemic event or active GI bleeding. Biopsies from colonoscopy showing right colon and distal sigmoid biopsies with active colitis with small fragments of inflammatory exudate and mild to moderate active colitis, respectively.   3. Obstipation symptoms. - resolved, having loose BMs. Requesting gas medication prn.  4. Hx of narcotic analgesic use which can exacerbate #3.  5. Alcoholism, presumably in remission for several months.   6. Child Pugh Class "C" cirrhosis  7. SBP - On Ceftriaxone 2g IV. WBCs increased from 22 on 10/7 > 28 on 10/8 > 17 on 10/9. Cell count on repeat paracentesis with increased cell count from 1,690 > 2,644, neutophils 71 > 85.  Plan: 1. Continue Ceftriaxone 2g IV q24 hours. Per Dr. Alice Reichert, continue for 3 more days, with repeat paracentesis on 4th day with repeat fluid studies to monitor PMN.  2. Continue to monitor WBCs & Hgb/Hct. C diff order placed per Internal Medicine - obtain if diarrhea persists despite holding laxatives, or if clinical signs of infection occur.  3. Continue pantoprazole 40 mg q 12 hrs.  4. Simethicone prn gas.  5. Weekend follow-up to be assumed by on-call provider (Dr. Marius Ditch).  Please call with questions or concerns.    Lavera Guise, PA-C [facility] [phone]  (Cell)

## 2019-06-08 NOTE — Plan of Care (Signed)
  Problem: Clinical Measurements: Goal: Will remain free from infection Outcome: Progressing Goal: Diagnostic test results will improve Outcome: Progressing   Problem: Activity: Goal: Risk for activity intolerance will decrease Outcome: Progressing   Problem: Nutrition: Goal: Adequate nutrition will be maintained Outcome: Progressing   Problem: Elimination: Goal: Will not experience complications related to bowel motility Outcome: Progressing   Problem: Pain Managment: Goal: General experience of comfort will improve Outcome: Progressing   Problem: Safety: Goal: Ability to remain free from injury will improve Outcome: Progressing

## 2019-06-08 NOTE — Care Management Important Message (Signed)
Important Message  Patient Details  Name: Anita Reed MRN: [id number] Date of Birth: 09-15-1960   Medicare Important Message Given:  Yes     Dannette Barbara 06/08/2019, 1:30 PM

## 2019-06-09 ENCOUNTER — Inpatient Hospital Stay: Payer: Medicare Other

## 2019-06-09 DIAGNOSIS — I8510 Secondary esophageal varices without bleeding: Secondary | ICD-10-CM

## 2019-06-09 DIAGNOSIS — K922 Gastrointestinal hemorrhage, unspecified: Secondary | ICD-10-CM

## 2019-06-09 DIAGNOSIS — A0472 Enterocolitis due to Clostridium difficile, not specified as recurrent: Secondary | ICD-10-CM

## 2019-06-09 DIAGNOSIS — Z8719 Personal history of other diseases of the digestive system: Secondary | ICD-10-CM

## 2019-06-09 DIAGNOSIS — F419 Anxiety disorder, unspecified: Secondary | ICD-10-CM

## 2019-06-09 DIAGNOSIS — R188 Other ascites: Secondary | ICD-10-CM

## 2019-06-09 DIAGNOSIS — K7460 Unspecified cirrhosis of liver: Secondary | ICD-10-CM

## 2019-06-09 DIAGNOSIS — Z7289 Other problems related to lifestyle: Secondary | ICD-10-CM

## 2019-06-09 DIAGNOSIS — K766 Portal hypertension: Secondary | ICD-10-CM

## 2019-06-09 DIAGNOSIS — F17200 Nicotine dependence, unspecified, uncomplicated: Secondary | ICD-10-CM

## 2019-06-09 LAB — BASIC METABOLIC PANEL
Anion gap: 6 (ref 5–15)
BUN: 8 mg/dL (ref 6–20)
CO2: 20 mmol/L — ABNORMAL LOW (ref 22–32)
Calcium: 8.2 mg/dL — ABNORMAL LOW (ref 8.9–10.3)
Chloride: 112 mmol/L — ABNORMAL HIGH (ref 98–111)
Creatinine, Ser: 0.42 mg/dL — ABNORMAL LOW (ref 0.44–1.00)
GFR calc Af Amer: >60 mL/min (ref >60)
GFR calc non Af Amer: >60 mL/min (ref >60)
Glucose, Bld: 110 mg/dL — ABNORMAL HIGH (ref 70–99)
Potassium: 4.3 mmol/L (ref 3.5–5.1)
Sodium: 138 mmol/L (ref 135–145)

## 2019-06-09 LAB — LIPASE, FLUID: Lipase-Fluid: 11 U/L

## 2019-06-09 LAB — CBC
HCT: 29.7 % — ABNORMAL LOW (ref 36.0–46.0)
Hemoglobin: 8.7 g/dL — ABNORMAL LOW (ref 12.0–15.0)
MCH: 26.4 pg (ref 26.0–34.0)
MCHC: 29.3 g/dL — ABNORMAL LOW (ref 30.0–36.0)
MCV: 90.3 fL (ref 80.0–100.0)
Platelets: 322 10*3/uL (ref 150–400)
RBC: 3.29 MIL/uL — ABNORMAL LOW (ref 3.87–5.11)
RDW: 22.5 % — ABNORMAL HIGH (ref 11.5–15.5)
WBC: 19.2 10*3/uL — ABNORMAL HIGH (ref 4.0–10.5)
nRBC: 0.2 % (ref 0.0–0.2)

## 2019-06-09 LAB — TOTAL BILIRUBIN, BODY FLUID: Total bilirubin, fluid: 0.2 mg/dL

## 2019-06-09 IMAGING — DX DG ABDOMEN 1V
2 series · 2 of 2 positions shown · non-contrast
Comparison: [DATE]

CLINICAL DATA: Abdominal pain.

EXAM:
ABDOMEN - 1 VIEW

[abdomen supine (1 of 2)]
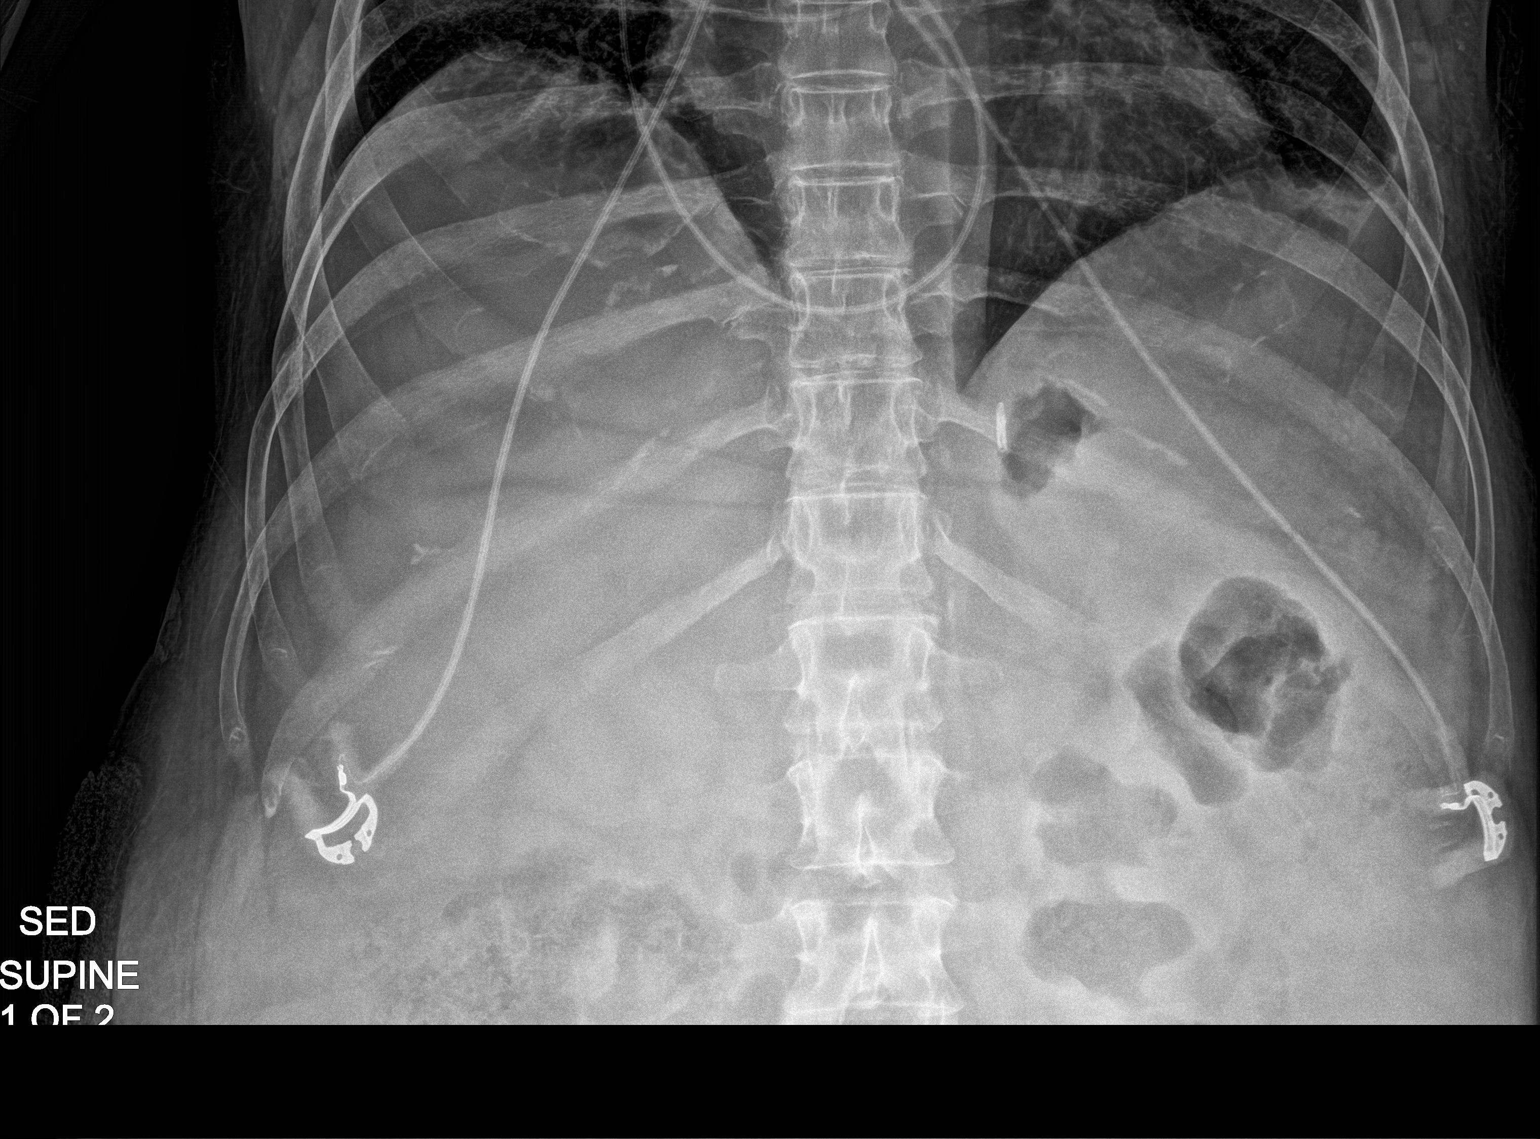

[abdomen supine (2 of 2)]
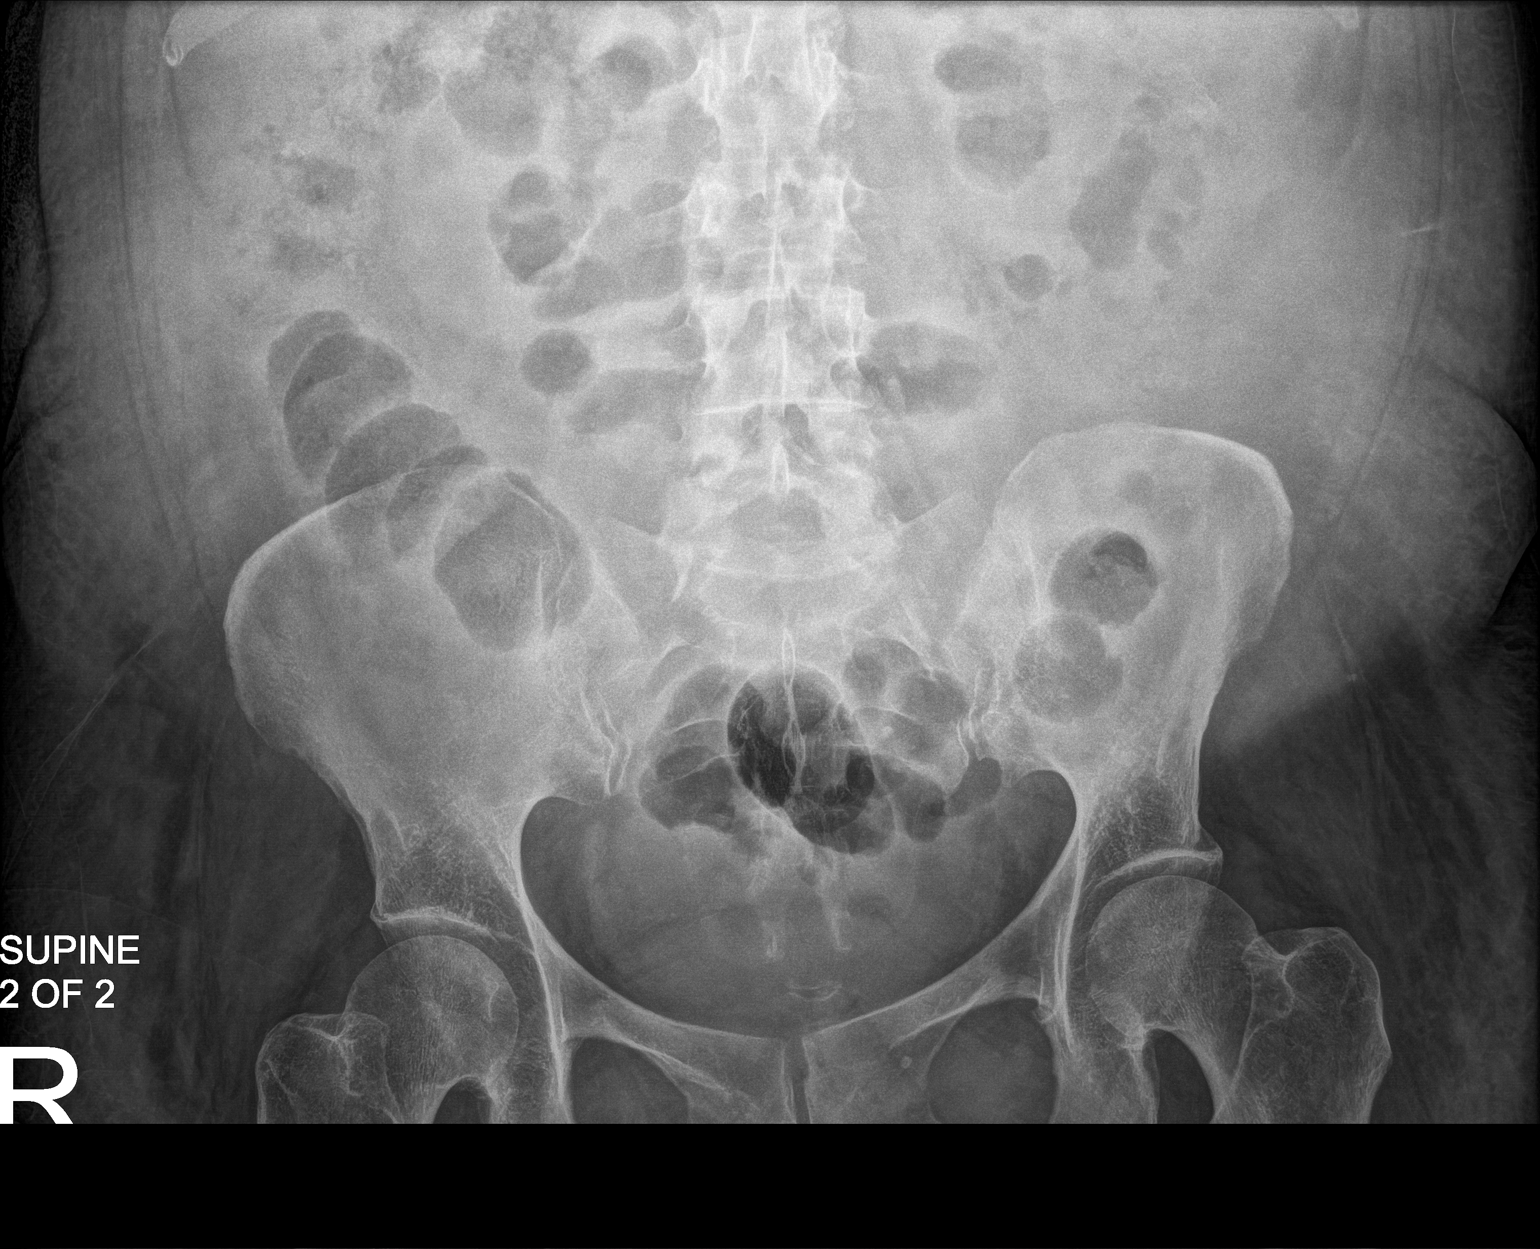

[2 of 2 positions shown; findings below may reference images not displayed]

FINDINGS: The bowel gas pattern is normal. No radio-opaque calculi or other
significant radiographic abnormality are seen.

Vascular clip at the GE junction noted.
IMPRESSION: Negative.

## 2019-06-09 MED ORDER — CIPROFLOXACIN IN D5W 400 MG/200ML IV SOLN
400.0000 mg | Freq: Two times a day (BID) | INTRAVENOUS | Status: DC
  Administered 2019-06-09 (×2): 400 mg via INTRAVENOUS
  Filled 2019-06-09 (×3): qty 200

## 2019-06-09 MED ORDER — VANCOMYCIN 50 MG/ML ORAL SOLUTION
125.0000 mg | Freq: Four times a day (QID) | ORAL | Status: DC
  Administered 2019-06-09 – 2019-06-11 (×10): 125 mg via ORAL
  Filled 2019-06-09 (×11): qty 2.5

## 2019-06-09 MED ORDER — RIFAXIMIN 550 MG PO TABS
550.0000 mg | ORAL_TABLET | Freq: Two times a day (BID) | ORAL | Status: DC
  Administered 2019-06-09 – 2019-06-11 (×5): 550 mg via ORAL
  Filled 2019-06-09 (×6): qty 1

## 2019-06-09 NOTE — Plan of Care (Signed)
  Problem: Education: Goal: Ability to identify signs and symptoms of gastrointestinal bleeding will improve Outcome: Progressing   Problem: Fluid Volume: Goal: Will show no signs and symptoms of excessive bleeding Outcome: Progressing   Problem: Bowel/Gastric: Goal: Will show no signs and symptoms of gastrointestinal bleeding Outcome: Progressing   Problem: Clinical Measurements: Goal: Complications related to the disease process, condition or treatment will be avoided or minimized Outcome: Progressing

## 2019-06-09 NOTE — Progress Notes (Signed)
Anita Darby, MD [address]  [place]  [place], [place] [address]  Main: [phone]  Fax: [phone] Pager: [phone]   Subjective: Slow speech, continues to have abdominal pain, tolerating p.o well, patient had 3 bowel movements yesterday which were brown. Had one today  Objective: Vital signs in last 24 hours: Vitals:   06/08/19 1700 06/08/19 2203 06/09/19 0521 06/09/19 0807  BP: (!) 104/42 100/65 108/62 106/65  Pulse: 66 78 76 68  Resp: 16 20 20 18   Temp: 97.7 F (36.5 C) 98.3 F (36.8 C) 97.9 F (36.6 C) 98.6 F (37 C)  TempSrc: Oral Oral Oral Oral  SpO2: 99% 100% 100% 100%  Weight:   77.2 kg   Height:       Weight change:   Intake/Output Summary (Last 24 hours) at 06/09/2019 1629 Last data filed at 06/09/2019 0700 Gross per 24 hour  Intake 2101.28 ml  Output 100 ml  Net 2001.28 ml     Exam: Heart:: Regular rate and rhythm, S1S2 present or without murmur or extra heart sounds Lungs: normal and clear to auscultation Abdomen: Soft, mild diffuse tenderness, distended, tympanic to percussion   Lab Results: CBC Latest Ref Rng & Units 06/09/2019 06/08/2019 06/07/2019  WBC 4.0 - 10.5 K/uL 19.2(H) 17.8(H) 28.1(H)  Hemoglobin 12.0 - 15.0 g/dL 8.7(L) 8.2(L) 8.8(L)  Hematocrit 36.0 - 46.0 % 29.7(L) 25.9(L) 28.2(L)  Platelets 150 - 400 K/uL 322 253 254   CMP Latest Ref Rng & Units 06/09/2019 06/08/2019 06/07/2019  Glucose 70 - 99 mg/dL 110(H) 144(H) 97  BUN 6 - 20 mg/dL 8 10 10   Creatinine 0.44 - 1.00 mg/dL 0.42(L) 0.41(L) 0.43(L)  Sodium 135 - 145 mmol/L 138 136 139  Potassium 3.5 - 5.1 mmol/L 4.3 2.9(L) 2.9(L)  Chloride 98 - 111 mmol/L 112(H) 108 108  CO2 22 - 32 mmol/L 20(L) 22 22  Calcium 8.9 - 10.3 mg/dL 8.2(L) 8.0(L) 8.1(L)  Total Protein 6.5 - 8.1 g/dL - 4.9(L) 5.2(L)  Total Bilirubin 0.3 - 1.2 mg/dL - 2.5(H) 3.0(H)  Alkaline Phos 38 - 126 U/L - 183(H) 202(H)  AST 15 - 41 U/L - 34 38  ALT 0 - 44 U/L - 22 23    Micro Results:  Recent Results (from the past 240 hour(s))  SARS Coronavirus 2 [facility] order, Performed in [facility] hospital lab) Nasopharyngeal Nasopharyngeal Swab     Status: None   Collection Time: 06/01/19  1:58 PM   Specimen: Nasopharyngeal Swab  Result Value Ref Range Status   SARS Coronavirus 2 NEGATIVE NEGATIVE Final    Comment: (NOTE) If result is NEGATIVE SARS-CoV-2 target nucleic acids are NOT DETECTED. The SARS-CoV-2 RNA is generally detectable in upper and lower  respiratory specimens during the acute phase of infection. The lowest  concentration of SARS-CoV-2 viral copies this assay can detect is 250  copies / mL. A negative result does not preclude SARS-CoV-2 infection  and should not be used as the sole basis for treatment or other  patient management decisions.  A negative result may occur with  improper specimen collection / handling, submission of specimen other  than nasopharyngeal swab, presence of viral mutation(s) within the  areas targeted by this assay, and inadequate number of viral copies  (<250 copies / mL). A negative result must be combined with clinical  observations, patient history, and epidemiological information. If result is POSITIVE SARS-CoV-2 target nucleic acids are DETECTED. The SARS-CoV-2 RNA is generally detectable in upper and lower  respiratory specimens dur ing the acute phase of infection.  Positive  results are indicative of active infection with SARS-CoV-2.  Clinical  correlation with patient history and other diagnostic information is  necessary to determine patient infection status.  Positive results do  not rule out bacterial infection or co-infection with other viruses. If result is PRESUMPTIVE POSTIVE SARS-CoV-2 nucleic acids MAY BE PRESENT.   A presumptive positive result was obtained on the submitted specimen  and confirmed on repeat testing.  While 2019 novel coronavirus  (SARS-CoV-2) nucleic acids may be present in the submitted sample   additional confirmatory testing may be necessary for epidemiological  and / or clinical management purposes  to differentiate between  SARS-CoV-2 and other Sarbecovirus currently known to infect humans.  If clinically indicated additional testing with an alternate test  methodology [phone]) is advised. The SARS-CoV-2 RNA is generally  detectable in upper and lower respiratory sp ecimens during the acute  phase of infection. The expected result is Negative. Fact Sheet for Patients:  StrictlyIdeas.no Fact Sheet for Healthcare Providers: BankingDealers.co.za This test is not yet approved or cleared by the [place] FDA and has been authorized for detection and/or diagnosis of SARS-CoV-2 by FDA under an Emergency Use Authorization (EUA).  This EUA will remain in effect (meaning this test can be used) for the duration of the COVID-19 declaration under Section 564(b)(1) of the Act, 21 U.S.C. section 360bbb-3(b)(1), unless the authorization is terminated or revoked sooner. Performed at [facility], [address] [place], [place] [address]   Body fluid culture     Status: None   Collection Time: 06/04/19  1:50 PM   Specimen: PATH Cytology Peritoneal fluid  Result Value Ref Range Status   Specimen Description   Final    PERITONEAL Performed at [facility], [address] [place], [place] [address]    Special Requests   Final    NONE Performed at [facility], [address] [place], [place] [address]    Gram Stain   Final    RARE WBC PRESENT, PREDOMINANTLY PMN NO ORGANISMS SEEN    Culture   Final    NO GROWTH 3 DAYS Performed at [facility], [place] [address], [place], [place] [address]    Report Status 06/08/2019 FINAL  Final  Fungus Culture With Stain     Status: None (Preliminary result)   Collection Time: 06/04/19  1:50 PM   Specimen: PATH Cytology Peritoneal fluid  Result Value Ref  Range Status   Fungus Stain Final report  Final    Comment: (NOTE) Performed At: [facility] [place], [place] [id number] Rush Farmer MD [id number]    Fungus (Mycology) Culture PENDING  Incomplete   Fungal Source PERITONEAL  Final    Comment: Performed at [facility], [address] [place],  [address]  Fungus Culture Result     Status: None   Collection Time: 06/04/19  1:50 PM  Result Value Ref Range Status   Result 1 Comment  Final    Comment: (NOTE) KOH/Calcofluor preparation:  no fungus observed. Performed At: [facility] [place], [place] [id number] Rush Farmer MD [id number]   Body fluid culture     Status: None (Preliminary result)   Collection Time: 06/07/19 11:00 AM   Specimen: PATH Cytology Peritoneal fluid  Result Value Ref Range Status   Specimen Description   Final    PLEURAL Performed at [facility], [address], [place], [place] [address]    Special Requests   Final    NONE Performed at [facility], [place], [place] [address]    Gram Stain   Final    WBC PRESENT,BOTH PMN AND MONONUCLEAR NO ORGANISMS SEEN CYTOSPIN SMEAR    Culture   Final    NO GROWTH 2 DAYS Performed at [facility], [place] [address] [place], [place] [address]    Report Status PENDING  Incomplete  C difficile quick scan w PCR reflex     Status: Abnormal   Collection Time: 06/08/19  5:01 PM   Specimen: STOOL  Result Value Ref Range Status   C Diff antigen POSITIVE (A) NEGATIVE Final   C Diff toxin NEGATIVE NEGATIVE Final   C Diff interpretation Results are indeterminate. See PCR results.  Final    Comment: Performed at [facility], [address] [place], [place] [address]  C. Diff by PCR, Reflexed     Status: Abnormal   Collection Time: 06/08/19  5:01 PM  Result Value Ref Range Status   Toxigenic C. Difficile by PCR POSITIVE (A) NEGATIVE Final     Comment: Positive for toxigenic C. difficile with little to no toxin production. Only treat if clinical presentation suggests symptomatic illness. Performed at [facility], [address] [place], [place] [address]    Studies/Results: No results found. Medications:  I have reviewed the patient's current medications. Prior to Admission:  Medications Prior to Admission  Medication Sig Dispense Refill Last Dose  . furosemide (LASIX) 40 MG tablet Take 1 tablet (40 mg total) by mouth 2 (two) times daily. (Patient taking differently: Take 40 mg by mouth 2 (two) times daily as needed for fluid. ) 60 tablet 3 05/31/2019 at am  . linaclotide (LINZESS) 145 MCG CAPS capsule Take 145 mcg by mouth daily as needed (constipation).   05/29/2019 at unknown  . pantoprazole (PROTONIX) 40 MG tablet Take 1 tablet (40 mg total) by mouth 2 (two) times daily. 60 tablet 11 05/31/2019 at pm  . tiZANidine (ZANAFLEX) 4 MG tablet Take 0.5-1 tablets (2-4 mg total) by mouth every 8 (eight) hours as needed for muscle spasms. 270 tablet 0 06/01/2019 at am  . nadolol (CORGARD) 20 MG tablet Take 1 tablet (20 mg total) by mouth daily. (Patient not taking: Reported on 06/01/2019) 90 tablet 1 Not Taking at Unknown time  . Potassium Chloride ER 20 MEQ TBCR Take one tablet twice daily (Patient not taking: Reported on 04/02/2019) 60 tablet 1 Not Taking at Unknown time  . spironolactone (ALDACTONE) 50 MG tablet Take 2 tablets (100 mg total) by mouth daily. (Patient not taking: Reported on 06/01/2019) 180 tablet 1 Not Taking at Unknown time   Scheduled: . sodium chloride   Intravenous Once  . pantoprazole  40 mg Oral BID  . potassium chloride  40 mEq Oral BID  . propranolol ER  60 mg Oral Daily  . rifaximin  550 mg Oral BID  . sodium chloride flush  3 mL Intravenous Q12H  . vancomycin  125 mg Oral QID   Continuous: . sodium chloride Stopped (06/08/19 1033)  . 0.9 % NaCl with KCl 40 mEq / L 75 mL/hr (06/09/19 1414)  .  ciprofloxacin 400 mg (06/09/19 1419)   [id number] chloride, metoprolol tartrate, morphine injection, ondansetron **OR** ondansetron (ZOFRAN) IV, oxyCODONE, sodium chloride flush Anti-infectives (From admission, onward)   Start     Dose/Rate Route Frequency Ordered  Stop   06/09/19 1400  ciprofloxacin (CIPRO) IVPB 400 mg     400 mg 200 mL/hr over 60 Minutes Intravenous Every 12 hours 06/09/19 1304     06/09/19 1400  vancomycin (VANCOCIN) 50 mg/mL oral solution 125 mg     125 mg Oral 4 times daily 06/09/19 1348 06/19/19 1359   06/09/19 1230  rifaximin (XIFAXAN) tablet 550 mg     550 mg Oral 2 times daily 06/09/19 1222     06/06/19 1000  cefTRIAXone (ROCEPHIN) 2 g in sodium chloride 0.9 % 100 mL IVPB  Status:  Discontinued     2 g 200 mL/hr over 30 Minutes Intravenous Every 24 hours 06/06/19 0858 06/09/19 1304   06/03/19 1500  cefTRIAXone (ROCEPHIN) 2 g in sodium chloride 0.9 % 100 mL IVPB  Status:  Discontinued     2 g 200 mL/hr over 30 Minutes Intravenous Every 24 hours 06/03/19 1438 06/03/19 1730   06/03/19 1430  cefTRIAXone (ROCEPHIN) 1 g in sodium chloride 0.9 % 100 mL IVPB  Status:  Discontinued     1 g 200 mL/hr over 30 Minutes Intravenous Every 24 hours 06/03/19 1424 06/03/19 1438   06/02/19 1515  fluconazole (DIFLUCAN) tablet 150 mg     150 mg Oral  Once 06/02/19 1510 06/02/19 1622   06/01/19 1300  cefTRIAXone (ROCEPHIN) 1 g in sodium chloride 0.9 % 100 mL IVPB     1 g 200 mL/hr over 30 Minutes Intravenous  Once 06/01/19 1253 06/01/19 1407     Scheduled Meds: . sodium chloride   Intravenous Once  . pantoprazole  40 mg Oral BID  . potassium chloride  40 mEq Oral BID  . propranolol ER  60 mg Oral Daily  . rifaximin  550 mg Oral BID  . sodium chloride flush  3 mL Intravenous Q12H  . vancomycin  125 mg Oral QID   Continuous Infusions: . sodium chloride Stopped (06/08/19 1033)  . 0.9 % NaCl with KCl 40 mEq / L 75 mL/hr (06/09/19 1414)  . ciprofloxacin 400 mg (06/09/19 1419)    PRN Meds:.sodium chloride, metoprolol tartrate, morphine injection, ondansetron **OR** ondansetron (ZOFRAN) IV, oxyCODONE, sodium chloride flush   Assessment: Active Problems:   GIB (gastrointestinal bleeding)   GI bleeding  Persistent leukocytosis SBP, repeat paracentesis revealed persistent leukocytosis while on ceftriaxone C. difficile colitis: Patient had pseudomembranes based on the colonoscopy on 10/4.  Stool studies were ordered as patient developed diarrhea, C. difficile PCR came back positive.  Colon biopsies revealed acute colitis  Plan: Spontaneous bacterial peritonitis:  Cultures are negative so far Recommend to switch from ceftriaxone to ciprofloxacin.  Ceftriaxone is indicated for SBP prophylaxis only For treatment of SBP, recommendations are either cefotaxime or ciprofloxacin. As cefotaxime is not available as inpatient, recommend ciprofloxacin 400 mg IV twice daily Recommend fluid analysis on day 5 of ciprofloxacin to assess response  C. difficile pseudomembranous colitis Recommend to start oral vancomycin 125 mg every 6 hours ID is consulted, Dr. Steva Ready is notified  Decompensated alcoholic cirrhosis of liver Recommend to start rifaximin, patient has encephalopathy Hold off on lactulose due to C. difficile colitis Complete abstinence from alcohol Recommend palliative care consult   LOS: 8 days   Anita Reed 06/09/2019, 4:29 PM

## 2019-06-09 NOTE — Progress Notes (Signed)
[facility] [place] NAME: Anita Reed    MR#:  [id number]  DATE OF BIRTH:  May 17, 1961  SUBJECTIVE:  CHIEF COMPLAINT:   Chief Complaint  Patient presents with  . Abdominal Pain  . GI Bleeding   The patient complains of abdominal pain and requested pain medication continuously.  Had multiple watery bowel movement continued for last 2 to 3 days, no blood. Since last evening did not had any BM.  REVIEW OF SYSTEMS:  Review of Systems  Constitutional: Positive for malaise/fatigue. Negative for chills and fever.  HENT: Negative for sore throat.   Eyes: Negative for blurred vision and double vision.  Respiratory: Negative for cough, hemoptysis, shortness of breath, wheezing and stridor.   Cardiovascular: Negative for chest pain, palpitations, orthopnea and leg swelling.  Gastrointestinal: Positive for diarrhea. Negative for abdominal pain, blood in stool, constipation, melena, nausea and vomiting.  Genitourinary: Negative for dysuria, flank pain and hematuria.  Musculoskeletal: Negative for back pain and joint pain.  Skin: Negative for rash.  Neurological: Negative for dizziness, sensory change, focal weakness, seizures, loss of consciousness, weakness and headaches.  Endo/Heme/Allergies: Negative for polydipsia.  Psychiatric/Behavioral: Negative for depression. The patient is not nervous/anxious.     DRUG ALLERGIES:  No Known Allergies VITALS:  Blood pressure 106/65, pulse 68, temperature 98.6 F (37 C), temperature source Oral, resp. rate 18, height 5\' 8"  (1.727 m), weight 77.2 kg, SpO2 100 %. PHYSICAL EXAMINATION:  Physical Exam Constitutional:      General: She is not in acute distress. HENT:     Head: Normocephalic.     Mouth/Throat:     Mouth: Mucous membranes are moist.  Eyes:     General: No scleral icterus.    Conjunctiva/sclera: Conjunctivae normal.     Pupils: Pupils are equal, round, and reactive to light.  Neck:      Musculoskeletal: Normal range of motion and neck supple.     Vascular: No JVD.     Trachea: No tracheal deviation.  Cardiovascular:     Rate and Rhythm: Normal rate and regular rhythm.     Heart sounds: Normal heart sounds. No murmur. No gallop.   Pulmonary:     Effort: Pulmonary effort is normal. No respiratory distress.     Breath sounds: Normal breath sounds. No wheezing or rales.  Abdominal:     General: Bowel sounds are normal. There is no distension.     Palpations: Abdomen is soft.     Tenderness: There is abdominal tenderness. There is no rebound.  Musculoskeletal: Normal range of motion.        General: No tenderness.     Right lower leg: No edema.     Left lower leg: No edema.  Skin:    Findings: No erythema or rash.  Neurological:     General: No focal deficit present.     Mental Status: She is alert and oriented to person, place, and time.     Cranial Nerves: No cranial nerve deficit.  Psychiatric:        Mood and Affect: Mood normal.    LABORATORY PANEL:  Female CBC Recent Labs  Lab 06/09/19 0613  WBC 19.2*  HGB 8.7*  HCT 29.7*  PLT 322   ------------------------------------------------------------------------------------------------------------------ Chemistries  Recent Labs  Lab 06/08/19 0431 06/09/19 0613  NA 136 138  K 2.9* 4.3  CL 108 112*  CO2 22 20*  GLUCOSE 144* 110*  BUN 10 8  CREATININE 0.41* 0.42*  CALCIUM 8.0* 8.2*  MG 1.7  --   AST 34  --   ALT 22  --   ALKPHOS 183*  --   BILITOT 2.5*  --    RADIOLOGY:  No results found. ASSESSMENT AND PLAN:   57 year old female with liver cirrhosis and portal hypertension who presents to the emergency room due to melena and hematemesis.  1.  Upper GI bleed from underlying liver cirrhosis/portal hypertension in the setting of using NSAIDs. The patient has been treated with PPI and octreotide S/p EGD: Grade II and large (> 5 mm) esophageal varices. Portal hypertensive gastropathy. Elective  colonoscopy: Pseudomembranous enterocolitis, Blood in the entire examined colon and Non-bleeding internal hemorrhoids per Dr. Alice Reichert. Given octreotide and Protonix drip. Continue p.o. Protonix twice daily. CTA abdomen:  1. Hepatic cirrhosis with portal hypertension and small volume ascites. 2. Marked body wall edema consistent with anasarca. 3. Diffuse mild submucosal edema throughout the small bowel consistent with portal enteropathy.  Currently we are monitoring as per GI.  Hemoglobin is stable.  2.  Acute on chronic blood loss anemia due to problem #1:  S/p 2 unit PRBC transfusion.  Hemoglobin decreased to 6.3, s/p 1 more unit PRBC transfusion and up to 8.1. follow-up hemoglobin in am.   3.  Portal hypertension due to liver cirrhosis from chronic EtOH: Hold all medications including spironolactone, nadolol and Lasix for now due to low blood pressure and GI blood loss.  start small dose propranolol now as BP stabilized.  4.  EtOH abuse: Patient has not had any alcohol for several months  5.    Tachycardia due to above: Continue telemetry monitoring.  Improved with IV fluid support.    6. SBP On ceftriaxone. Repeat ascitic tap again showed significant number of white blood cells.  GI continued use of IV antibiotic and plan is to do a sciatic tap after 3 days again.  7. Liver cirrhosis with ascites.  Abdominal ultrasound: moderate ascites.  S/p Paracentesis ( 10/5) yielding 2.4 liters of peritoneal fluid.  Had 1700 WBCs in fluid, given rocephin on 10/2 and 10/4- spoke to GI, started back again on Rocephin. Repeat paracentesis 10/8 -with again high leukocytes in the fluid. Patient's WBC is on CBC is also going up.  8.Constipation, possible due to narcotics.  Advised patient avoid narcotics.  Laxatives.     Now have losoe stools, hold laxatives.  9.  Diarrhea-C. difficile colitis?    As patient had findings of pseudomembranous colitis, and her diarrhea persisted and white blood  cell count went up-GI also agrees to start treating for C. difficile with oral vancomycin now. Called ID consult.  10. Tobacco abuse.  Smoking cessation was counseled for 3 to 4 minutes.  The patient refused nicotine patch.  All the records are reviewed and case discussed with Care Management/Social Worker. Management plans discussed with the patient, family and they are in agreement.  CODE STATUS: Full Code  TOTAL TIME TAKING CARE OF THIS PATIENT: 42 minutes.   More than 50% of the time was spent in counseling/coordination of care: YES Discussed with ID and GI.  POSSIBLE D/C IN 2 DAYS, DEPENDING ON CLINICAL CONDITION.   Vaughan Basta M.D on 06/09/2019 at 4:15 PM  Between 7am to 6pm - Pager - [phone]  After 6pm go to [email or web address] - [facility]

## 2019-06-09 NOTE — Progress Notes (Signed)
[facility] [place] NAME: Anita Reed    MR#:  [id number]  DATE OF BIRTH:  June 21, 1961  SUBJECTIVE:  CHIEF COMPLAINT:   Chief Complaint  Patient presents with  . Abdominal Pain  . GI Bleeding   The patient complains of abdominal pain and requested pain medication continuously.  Had multiple watery bowel movement continued for last 2 to 3 days, no blood.  REVIEW OF SYSTEMS:  Review of Systems  Constitutional: Positive for malaise/fatigue. Negative for chills and fever.  HENT: Negative for sore throat.   Eyes: Negative for blurred vision and double vision.  Respiratory: Negative for cough, hemoptysis, shortness of breath, wheezing and stridor.   Cardiovascular: Negative for chest pain, palpitations, orthopnea and leg swelling.  Gastrointestinal: Positive for diarrhea. Negative for abdominal pain, blood in stool, constipation, melena, nausea and vomiting.  Genitourinary: Negative for dysuria, flank pain and hematuria.  Musculoskeletal: Negative for back pain and joint pain.  Skin: Negative for rash.  Neurological: Negative for dizziness, sensory change, focal weakness, seizures, loss of consciousness, weakness and headaches.  Endo/Heme/Allergies: Negative for polydipsia.  Psychiatric/Behavioral: Negative for depression. The patient is not nervous/anxious.     DRUG ALLERGIES:  No Known Allergies VITALS:  Blood pressure 106/65, pulse 68, temperature 98.6 F (37 C), temperature source Oral, resp. rate 18, height 5\' 8"  (1.727 m), weight 77.2 kg, SpO2 100 %. PHYSICAL EXAMINATION:  Physical Exam Constitutional:      General: She is not in acute distress. HENT:     Head: Normocephalic.     Mouth/Throat:     Mouth: Mucous membranes are moist.  Eyes:     General: No scleral icterus.    Conjunctiva/sclera: Conjunctivae normal.     Pupils: Pupils are equal, round, and reactive to light.  Neck:     Musculoskeletal: Normal range of  motion and neck supple.     Vascular: No JVD.     Trachea: No tracheal deviation.  Cardiovascular:     Rate and Rhythm: Normal rate and regular rhythm.     Heart sounds: Normal heart sounds. No murmur. No gallop.   Pulmonary:     Effort: Pulmonary effort is normal. No respiratory distress.     Breath sounds: Normal breath sounds. No wheezing or rales.  Abdominal:     General: Bowel sounds are normal. There is no distension.     Palpations: Abdomen is soft.     Tenderness: There is abdominal tenderness. There is no rebound.  Musculoskeletal: Normal range of motion.        General: No tenderness.     Right lower leg: No edema.     Left lower leg: No edema.  Skin:    Findings: No erythema or rash.  Neurological:     General: No focal deficit present.     Mental Status: She is alert and oriented to person, place, and time.     Cranial Nerves: No cranial nerve deficit.  Psychiatric:        Mood and Affect: Mood normal.    LABORATORY PANEL:  Female CBC Recent Labs  Lab 06/09/19 0613  WBC 19.2*  HGB 8.7*  HCT 29.7*  PLT 322   ------------------------------------------------------------------------------------------------------------------ Chemistries  Recent Labs  Lab 06/08/19 0431 06/09/19 0613  NA 136 138  K 2.9* 4.3  CL 108 112*  CO2 22 20*  GLUCOSE 144* 110*  BUN 10 8  CREATININE 0.41* 0.42*  CALCIUM 8.0* 8.2*  MG 1.7  --   AST 34  --   ALT 22  --   ALKPHOS 183*  --   BILITOT 2.5*  --    RADIOLOGY:  No results found. ASSESSMENT AND PLAN:   58 year old female with liver cirrhosis and portal hypertension who presents to the emergency room due to melena and hematemesis.  1.  Upper GI bleed from underlying liver cirrhosis/portal hypertension in the setting of using NSAIDs. The patient has been treated with PPI and octreotide S/p EGD: Grade II and large (> 5 mm) esophageal varices. Portal hypertensive gastropathy. Elective colonoscopy: Pseudomembranous  enterocolitis, Blood in the entire examined colon and Non-bleeding internal hemorrhoids per Dr. Alice Reichert. Given octreotide and Protonix drip. Continue p.o. Protonix twice daily. CTA abdomen:  1. Hepatic cirrhosis with portal hypertension and small volume ascites. 2. Marked body wall edema consistent with anasarca. 3. Diffuse mild submucosal edema throughout the small bowel consistent with portal enteropathy.  Currently we are monitoring as per GI.  Hemoglobin is stable.  2.  Acute on chronic blood loss anemia due to problem #1:  S/p 2 unit PRBC transfusion.  Hemoglobin decreased to 6.3, s/p 1 more unit PRBC transfusion and up to 8.1. follow-up hemoglobin in am.   3.  Portal hypertension due to liver cirrhosis from chronic EtOH: Hold all medications including spironolactone, nadolol and Lasix for now due to low blood pressure and GI blood loss.  start small dose propranolol now as BP stabilized.  4.  EtOH abuse: Patient has not had any alcohol for several months  5.    Tachycardia due to above: Continue telemetry monitoring.  Improved with IV fluid support.    6. SBP On ceftriaxone. Repeat ascitic tap again showed significant number of white blood cells.  GI continued use of IV antibiotic and plan is to do a sciatic tap after 3 days again.  7. Liver cirrhosis with ascites.  Abdominal ultrasound: moderate ascites.  S/p Paracentesis ( 10/5) yielding 2.4 liters of peritoneal fluid.  Had 1700 WBCs in fluid, given rocephin on 10/2 and 10/4- spoke to GI, started back again on Rocephin. Repeat paracentesis 10/8 -with again high leukocytes in the fluid. Patient's WBC is on CBC is also going up.  8.Constipation, possible due to narcotics.  Advised patient avoid narcotics.  Laxatives.     Now have losoe stools, hold laxatives.  9.  Diarrhea    Last dose of laxative was at 2 AM on 06/05/19-since then we held stool softeners and still patient continued to have watery diarrhea.  She had  findings of colitis so I would like to check for C. Difficile.  10. Tobacco abuse.  Smoking cessation was counseled for 3 to 4 minutes.  The patient refused nicotine patch.  All the records are reviewed and case discussed with Care Management/Social Worker. Management plans discussed with the patient, family and they are in agreement.  CODE STATUS: Full Code  TOTAL TIME TAKING CARE OF THIS PATIENT: 32 minutes.   More than 50% of the time was spent in counseling/coordination of care: YES  POSSIBLE D/C IN 2 DAYS, DEPENDING ON CLINICAL CONDITION.   Vaughan Basta M.D on 06/09/2019 at 4:06 PM  Between 7am to 6pm - Pager - [phone]  After 6pm go to [email or web address] - [facility]

## 2019-06-09 NOTE — Plan of Care (Signed)

## 2019-06-09 NOTE — Consult Note (Signed)
NAME: Anita Reed  DOB: [birth year]  MRN: [id number]  Date/Time: 06/09/2019 11:05 PM  REQUESTING PROVIDER: Dr. Marius Ditch Subjective:  REASON FOR CONSULT: C. difficile colitis and SBP ?Patient is a poor historian.  Chart reviewed.  Spoke to husband was at bedside. Anita Reed is a 58 y.o. female with a history of cirrhosis, ascites, GI bleed alcohol use presented to the ED with abdominal distention and and bright red blood in in the bowel movement for the past 1 day.  She was admitted to the hospital on 06/01/2019.  Patient in May 2020 was hospitalized for abdominal swelling and diagnosed with ascites and cirrhosis.  She had paracentesis then.  Then in June she was rehospitalized for GI bleed and had endoscopy which revealed dieulafoy's lesion that was clipped.  Large esophageal varices was noted and severe portal hypertensive gastropathy in the gastric fundus also was noted .she was followed by Dr. Marius Ditch as outpatient .  She has been on antibiotics intermittently for for SBP prevention.  On 10 //2 she was hospitalized with GI bleeding on 10 /4 she underwent colonoscopy with findings suspicious for C. difficile colitis.  There was pseudomembranes noted as per Dr. Alice Reichert.  Biopsies were obtained.  There was also concern for ischemic colitis.  The biopsy showed inflammatory exudate and not pseudomembrane.  She was unable to provide any stools for C. difficile testing because she was obstipated. She was seen with laxatives and started having loose stools.  On 06/08/2019 she was tested for C. difficile and it came back positive and I am asked to see the patient for the same.     Past Medical History:  Diagnosis Date  . Anemia   . Cancer ([place])   . Cirrhosis ([place])   . Hypertension     Past Surgical History:  Procedure Laterality Date  . ABDOMINAL HYSTERECTOMY    . APPENDECTOMY    . COLONOSCOPY N/A 06/03/2019   Procedure: COLONOSCOPY;  Surgeon: Toledo, Benay Pike, MD;  Location: [facility] ENDOSCOPY;  Service:  Gastroenterology;  Laterality: N/A;  . ESOPHAGOGASTRODUODENOSCOPY N/A 01/29/2019   Procedure: ESOPHAGOGASTRODUODENOSCOPY (EGD);  Surgeon: [name], MD;  Location: [facility] ENDOSCOPY;  Service: Gastroenterology;  Laterality: N/A;  . ESOPHAGOGASTRODUODENOSCOPY N/A 06/01/2019   Procedure: ESOPHAGOGASTRODUODENOSCOPY (EGD);  Surgeon: Toledo, Benay Pike, MD;  Location: [facility] ENDOSCOPY;  Service: Gastroenterology;  Laterality: N/A;  . TONSILLECTOMY      Social History   Socioeconomic History  . Marital status: Married    Spouse name: [name]  . Number of children: 1  . Years of education: 19  . Highest education level: 9th grade  Occupational History  . Occupation: disabled  Social Needs  . Financial resource strain: Somewhat hard  . Food insecurity    Worry: Sometimes true    Inability: Sometimes true  . Transportation needs    Medical: No    Non-medical: No  Tobacco Use  . Smoking status: Current Every Day Smoker  . Smokeless tobacco: Never Used  Substance and Sexual Activity  . Alcohol use: Not Currently  . Drug use: Never  . Sexual activity: Not Currently  Lifestyle  . Physical activity    Days per week: 0 days    Minutes per session: 0 min  . Stress: Only a little  Relationships  . Social Herbalist on phone: Once a week    Gets together: Never    Attends religious service: Never    Active member of club or organization: No  Attends meetings of clubs or organizations: Never    Relationship status: Married  . Intimate partner violence    Fear of current or ex partner: No    Emotionally abused: No    Physically abused: No    Forced sexual activity: No  Other Topics Concern  . Not on file  Social History Narrative  . Not on file    Family History  Problem Relation Age of Onset  . Cancer Mother   . Cancer Father    No Known Allergies  ? Current Facility-Administered Medications  Medication Dose Route Frequency Provider Last Rate Last Dose  . 0.9  %  sodium chloride infusion (Manually program via Guardrails IV Fluids)   Intravenous Once Demetrios Loll, MD      . 0.9 %  sodium chloride infusion   Intravenous PRN Vaughan Basta, MD   Stopped at 06/08/19 1033  . 0.9 % NaCl with KCl 40 mEq / L  infusion   Intravenous Continuous Vaughan Basta, MD 75 mL/hr at 06/09/19 1414 75 mL/hr at 06/09/19 1414  . ciprofloxacin (CIPRO) IVPB 400 mg  400 mg Intravenous Q12H [name], MD 200 mL/hr at 06/09/19 2125 400 mg at 06/09/19 2125  . metoprolol tartrate (LOPRESSOR) injection 5 mg  5 mg Intravenous Q6H PRN Lance Coon, MD   5 mg at 06/04/19 0520  . morphine 2 MG/ML injection 2 mg  2 mg Intravenous Q4H PRN Lance Coon, MD   2 mg at 06/09/19 2112  . ondansetron (ZOFRAN) tablet 4 mg  4 mg Oral Q6H PRN Bettey Costa, MD       Or  . ondansetron (ZOFRAN) injection 4 mg  4 mg Intravenous Q6H PRN Bettey Costa, MD   4 mg at 06/04/19 1425  . oxyCODONE (Oxy IR/ROXICODONE) immediate release tablet 5 mg  5 mg Oral Q4H PRN Bettey Costa, MD   5 mg at 06/09/19 1836  . pantoprazole (PROTONIX) EC tablet 40 mg  40 mg Oral BID Demetrios Loll, MD   40 mg at 06/09/19 2112  . potassium chloride SA (KLOR-CON) CR tablet 40 mEq  40 mEq Oral BID Vaughan Basta, MD   40 mEq at 06/09/19 2112  . propranolol ER (INDERAL LA) 24 hr capsule 60 mg  60 mg Oral Daily Vaughan Basta, MD   60 mg at 06/09/19 1023  . rifaximin (XIFAXAN) tablet 550 mg  550 mg Oral BID [name], MD   550 mg at 06/09/19 2112  . sodium chloride flush (NS) 0.9 % injection 3 mL  3 mL Intravenous Q12H Vaughan Basta, MD   3 mL at 06/09/19 2120  . sodium chloride flush (NS) 0.9 % injection 3 mL  3 mL Intravenous PRN Vaughan Basta, MD   3 mL at 06/09/19 1642  . vancomycin (VANCOCIN) 50 mg/mL oral solution 125 mg  125 mg Oral QID [name], MD   125 mg at 06/09/19 2112     Abtx:  Anti-infectives (From admission, onward)   Start     Dose/Rate Route  Frequency Ordered Stop   06/09/19 1400  ciprofloxacin (CIPRO) IVPB 400 mg     400 mg 200 mL/hr over 60 Minutes Intravenous Every 12 hours 06/09/19 1304     06/09/19 1400  vancomycin (VANCOCIN) 50 mg/mL oral solution 125 mg     125 mg Oral 4 times daily 06/09/19 1348 06/19/19 1359   06/09/19 1230  rifaximin (XIFAXAN) tablet 550 mg     550 mg Oral  2 times daily 06/09/19 1222     06/06/19 1000  cefTRIAXone (ROCEPHIN) 2 g in sodium chloride 0.9 % 100 mL IVPB  Status:  Discontinued     2 g 200 mL/hr over 30 Minutes Intravenous Every 24 hours 06/06/19 0858 06/09/19 1304   06/03/19 1500  cefTRIAXone (ROCEPHIN) 2 g in sodium chloride 0.9 % 100 mL IVPB  Status:  Discontinued     2 g 200 mL/hr over 30 Minutes Intravenous Every 24 hours 06/03/19 1438 06/03/19 1730   06/03/19 1430  cefTRIAXone (ROCEPHIN) 1 g in sodium chloride 0.9 % 100 mL IVPB  Status:  Discontinued     1 g 200 mL/hr over 30 Minutes Intravenous Every 24 hours 06/03/19 1424 06/03/19 1438   06/02/19 1515  fluconazole (DIFLUCAN) tablet 150 mg     150 mg Oral  Once 06/02/19 1510 06/02/19 1622   06/01/19 1300  cefTRIAXone (ROCEPHIN) 1 g in sodium chloride 0.9 % 100 mL IVPB     1 g 200 mL/hr over 30 Minutes Intravenous  Once 06/01/19 1253 06/01/19 1407      REVIEW OF SYSTEMS:  Const: fever, negative chills, negative weight loss Eyes: negative diplopia or visual changes, negative eye pain ENT: negative coryza, negative sore throat Resp: negative cough, hemoptysis, has dyspnea Cards: negative for chest pain, palpitations, has lower extremity edema GU: negative for frequency, dysuria and hematuria GI:  abdominal pain, ditension, obstipation diarrhea, bleeding,  Skin: negative for rash and pruritus Heme: negative for easy bruising and gum/nose bleeding MS:  myalgias, arthralgias, back pain and muscle weakness Neurolo:negative for headaches, dizziness, vertigo, memory problems  Psych:  anxiety,  Endocrine: negative for thyroid,  diabetes Allergy/Immunology- negative for any medication or food allergies ? Pertinent Positives include : Objective:  VITALS:  BP (!) 112/56 (BP Location: Right Arm)   Pulse 71   Temp 97.7 F (36.5 C) (Oral)   Resp 14   Ht 5\' 8"  (1.727 m)   Wt 77.2 kg   SpO2 99%   BMI 25.86 kg/m  PHYSICAL EXAM:  General: Some lethargy, in some distress, appears stated age. pale Head: Normocephalic, without obvious abnormality, atraumatic. Eyes: Conjunctivae clear, anicteric sclerae. Pupils are equal ENT Nares normal. No drainage or sinus tenderness. Lips, mucosa, and tongue normal. No Thrush Neck: Supple, symmetrical, no adenopathy, thyroid: non tender no carotid bruit and no JVD. Back: No CVA tenderness. Lungs: Bilateral air entry decreased basis  Heart: Regular rate and rhythm, no murmur, rub or gallop. Abdomen: Distended.  Ascites.  Tenderness at the site of paracentesis on the right Extremities: Edema legs Skin: No rashes or lesions. Or bruising Lymph: Cervical, supraclavicular normal. Neurologic: Grossly non-focal Pertinent Labs Lab Results CBC    Component Value Date/Time   WBC 19.2 (H) 06/09/2019 0613   RBC 3.29 (L) 06/09/2019 0613   HGB 8.7 (L) 06/09/2019 0613   HCT 29.7 (L) 06/09/2019 0613   PLT 322 06/09/2019 0613   MCV 90.3 06/09/2019 0613   MCH 26.4 06/09/2019 0613   MCHC 29.3 (L) 06/09/2019 0613   RDW 22.5 (H) 06/09/2019 0613   LYMPHSABS 2,343 04/02/2019 1510   MONOABS 1.6 (H) 01/18/2019 1220   EOSABS 192 04/02/2019 1510   BASOSABS 61 04/02/2019 1510    CMP Latest Ref Rng & Units 06/09/2019 06/08/2019 06/07/2019  Glucose 70 - 99 mg/dL 110(H) 144(H) 97  BUN 6 - 20 mg/dL 8 10 10   Creatinine 0.44 - 1.00 mg/dL 0.42(L) 0.41(L) 0.43(L)  Sodium 135 - 145 mmol/L  138 136 139  Potassium 3.5 - 5.1 mmol/L 4.3 2.9(L) 2.9(L)  Chloride 98 - 111 mmol/L 112(H) 108 108  CO2 22 - 32 mmol/L 20(L) 22 22  Calcium 8.9 - 10.3 mg/dL 8.2(L) 8.0(L) 8.1(L)  Total Protein 6.5 - 8.1 g/dL -  4.9(L) 5.2(L)  Total Bilirubin 0.3 - 1.2 mg/dL - 2.5(H) 3.0(H)  Alkaline Phos 38 - 126 U/L - 183(H) 202(H)  AST 15 - 41 U/L - 34 38  ALT 0 - 44 U/L - 22 23      Microbiology: Recent Results (from the past 240 hour(s))  SARS Coronavirus 2 [facility] order, Performed in [facility] hospital lab) Nasopharyngeal Nasopharyngeal Swab     Status: None   Collection Time: 06/01/19  1:58 PM   Specimen: Nasopharyngeal Swab  Result Value Ref Range Status   SARS Coronavirus 2 NEGATIVE NEGATIVE Final    Comment: (NOTE) If result is NEGATIVE SARS-CoV-2 target nucleic acids are NOT DETECTED. The SARS-CoV-2 RNA is generally detectable in upper and lower  respiratory specimens during the acute phase of infection. The lowest  concentration of SARS-CoV-2 viral copies this assay can detect is 250  copies / mL. A negative result does not preclude SARS-CoV-2 infection  and should not be used as the sole basis for treatment or other  patient management decisions.  A negative result may occur with  improper specimen collection / handling, submission of specimen other  than nasopharyngeal swab, presence of viral mutation(s) within the  areas targeted by this assay, and inadequate number of viral copies  (<250 copies / mL). A negative result must be combined with clinical  observations, patient history, and epidemiological information. If result is POSITIVE SARS-CoV-2 target nucleic acids are DETECTED. The SARS-CoV-2 RNA is generally detectable in upper and lower  respiratory specimens dur ing the acute phase of infection.  Positive  results are indicative of active infection with SARS-CoV-2.  Clinical  correlation with patient history and other diagnostic information is  necessary to determine patient infection status.  Positive results do  not rule out bacterial infection or co-infection with other viruses. If result is PRESUMPTIVE POSTIVE SARS-CoV-2 nucleic acids MAY BE PRESENT.   A presumptive  positive result was obtained on the submitted specimen  and confirmed on repeat testing.  While 2019 novel coronavirus  (SARS-CoV-2) nucleic acids may be present in the submitted sample  additional confirmatory testing may be necessary for epidemiological  and / or clinical management purposes  to differentiate between  SARS-CoV-2 and other Sarbecovirus currently known to infect humans.  If clinically indicated additional testing with an alternate test  methodology [phone]) is advised. The SARS-CoV-2 RNA is generally  detectable in upper and lower respiratory sp ecimens during the acute  phase of infection. The expected result is Negative. Fact Sheet for Patients:  StrictlyIdeas.no Fact Sheet for Healthcare Providers: BankingDealers.co.za This test is not yet approved or cleared by the [place] FDA and has been authorized for detection and/or diagnosis of SARS-CoV-2 by FDA under an Emergency Use Authorization (EUA).  This EUA will remain in effect (meaning this test can be used) for the duration of the COVID-19 declaration under Section 564(b)(1) of the Act, 21 U.S.C. section 360bbb-3(b)(1), unless the authorization is terminated or revoked sooner. Performed at [facility], [address], [place], [place] [address]   Body fluid culture     Status: None   Collection Time: 06/04/19  1:50 PM   Specimen: PATH Cytology Peritoneal fluid  Result  Value Ref Range Status   Specimen Description   Final    PERITONEAL Performed at [facility], [address] [place], [place] [address]    Special Requests   Final    NONE Performed at [facility], [address] [place], [place] [address]    Gram Stain   Final    RARE WBC PRESENT, PREDOMINANTLY PMN NO ORGANISMS SEEN    Culture   Final    NO GROWTH 3 DAYS Performed at [facility], [place] [address], [place], [place] [address]    Report Status  06/08/2019 FINAL  Final  Fungus Culture With Stain     Status: None (Preliminary result)   Collection Time: 06/04/19  1:50 PM   Specimen: PATH Cytology Peritoneal fluid  Result Value Ref Range Status   Fungus Stain Final report  Final    Comment: (NOTE) Performed At: [facility] [place], [place] [id number] Rush Farmer MD [id number]    Fungus (Mycology) Culture PENDING  Incomplete   Fungal Source PERITONEAL  Final    Comment: Performed at [facility], [address] [place], [place] [address]  Fungus Culture Result     Status: None   Collection Time: 06/04/19  1:50 PM  Result Value Ref Range Status   Result 1 Comment  Final    Comment: (NOTE) KOH/Calcofluor preparation:  no fungus observed. Performed At: [facility] [place], [place] [id number] Rush Farmer MD [id number]   Body fluid culture     Status: None (Preliminary result)   Collection Time: 06/07/19 11:00 AM   Specimen: PATH Cytology Peritoneal fluid  Result Value Ref Range Status   Specimen Description   Final    PLEURAL Performed at [facility], [address], [place], [place] [address]    Special Requests   Final    NONE Performed at [facility], [address] [place], [place] [address]    Gram Stain   Final    WBC PRESENT,BOTH PMN AND MONONUCLEAR NO ORGANISMS SEEN CYTOSPIN SMEAR    Culture   Final    NO GROWTH 2 DAYS Performed at [facility], [place] [address], [place], [place] [address]    Report Status PENDING  Incomplete  C difficile quick scan w PCR reflex     Status: Abnormal   Collection Time: 06/08/19  5:01 PM   Specimen: STOOL  Result Value Ref Range Status   C Diff antigen POSITIVE (A) NEGATIVE Final   C Diff toxin NEGATIVE NEGATIVE Final   C Diff interpretation Results are indeterminate. See PCR results.  Final    Comment: Performed at [facility], [address] [place], [place]  [address]  C. Diff by PCR, Reflexed     Status: Abnormal   Collection Time: 06/08/19  5:01 PM  Result Value Ref Range Status   Toxigenic C. Difficile by PCR POSITIVE (A) NEGATIVE Final    Comment: Positive for toxigenic C. difficile with little to no toxin production. Only treat if clinical presentation suggests symptomatic illness. Performed at [facility], [address] [place], [place] [address]    Body fluid WBC 2644 0 lymphocytes Glucose 97 Total protein less than 3 Albumin less than 1 Body fluid culture from 06/07/2019 no growth so far 06/04/2019 blood culture no growth. IMAGING RESULTS: I have personally reviewed the films ? Impression/Recommendation ?58 year old female with history of cirrhosis of the liver decompensated with  ascites, portal hypertension, GI bleed spontaneous bacterial peritonitis.  GI bleed.  Concern for ischemic colitis versus versus pseudomembranous colitis.  Now has C. difficile and is being treated for it.  C. difficile rarely causes hemorrhagic colitis unless it is severe megacolon.  C. difficile diarrhea.  Wonder whether the C. difficile antigen being positive toxin negative and PCR positive is a sign of colonization rather than true infection.  But a pseudomembrane was noted on 10 for colonoscopy even though pathology did not confirm it and the white count is increasing patient should be started on vancomycin oral.  Ascites.  Spontaneous bacterial peritonitis has neutrocytic  ascites but cultures negative so far.  She has been on ceftriaxone since 9 2 with no change in abdominal pain or the systemic leukocytosis.  There is a possibility she could have developed resistance to either ceftriaxone as well as quinolone because she has taken it before.  Would recommend Zosyn for a few days.  Also avoid cephalosporin and quinolone as she has C. difficile and it can worsen.  Decompensated liver cirrhosis on propanolol, diuretics.   ? ? ___________________________________________________ Discussed with patient, and her husband. Also discussed with care team. Note:  This document was prepared using Dragon voice recognition software and may include unintentional dictation errors.

## 2019-06-10 DIAGNOSIS — K652 Spontaneous bacterial peritonitis: Secondary | ICD-10-CM

## 2019-06-10 LAB — BASIC METABOLIC PANEL
Anion gap: 10 (ref 5–15)
BUN: 8 mg/dL (ref 6–20)
CO2: 16 mmol/L — ABNORMAL LOW (ref 22–32)
Calcium: 8.4 mg/dL — ABNORMAL LOW (ref 8.9–10.3)
Chloride: 111 mmol/L (ref 98–111)
Creatinine, Ser: 0.39 mg/dL — ABNORMAL LOW (ref 0.44–1.00)
GFR calc Af Amer: >60 mL/min (ref >60)
GFR calc non Af Amer: >60 mL/min (ref >60)
Glucose, Bld: 88 mg/dL (ref 70–99)
Potassium: 5.4 mmol/L — ABNORMAL HIGH (ref 3.5–5.1)
Sodium: 137 mmol/L (ref 135–145)

## 2019-06-10 LAB — BODY FLUID CULTURE: Culture: NO GROWTH

## 2019-06-10 MED ORDER — PIPERACILLIN-TAZOBACTAM 3.375 G IVPB
3.3750 g | Freq: Three times a day (TID) | INTRAVENOUS | Status: DC
  Administered 2019-06-10 – 2019-06-11 (×5): 3.375 g via INTRAVENOUS
  Filled 2019-06-10 (×5): qty 50

## 2019-06-10 MED ORDER — PIPERACILLIN-TAZOBACTAM 3.375 G IVPB 30 MIN: 3.3750 g | Freq: Three times a day (TID) | INTRAVENOUS | Status: DC

## 2019-06-10 NOTE — Progress Notes (Signed)
[facility] [place] NAME: Anita Reed    MR#:  [id number]  DATE OF BIRTH:  [birth year]  SUBJECTIVE:  CHIEF COMPLAINT:   Chief Complaint  Patient presents with  . Abdominal Pain  . GI Bleeding   She does not have any bowel movement in last 24 hours.  Abdominal pain is much improved and able to control with oral medications now. Tolerating diet.  REVIEW OF SYSTEMS:  Review of Systems  Constitutional: Positive for malaise/fatigue. Negative for chills and fever.  HENT: Negative for sore throat.   Eyes: Negative for blurred vision and double vision.  Respiratory: Negative for cough, hemoptysis, shortness of breath, wheezing and stridor.   Cardiovascular: Negative for chest pain, palpitations, orthopnea and leg swelling.  Gastrointestinal: Negative for abdominal pain, blood in stool, constipation, diarrhea, melena, nausea and vomiting.  Genitourinary: Negative for dysuria, flank pain and hematuria.  Musculoskeletal: Negative for back pain and joint pain.  Skin: Negative for rash.  Neurological: Negative for dizziness, sensory change, focal weakness, seizures, loss of consciousness, weakness and headaches.  Endo/Heme/Allergies: Negative for polydipsia.  Psychiatric/Behavioral: Negative for depression. The patient is not nervous/anxious.     DRUG ALLERGIES:  No Known Allergies VITALS:  Blood pressure 107/65, pulse 78, temperature 97.7 F (36.5 C), resp. rate 18, height 5\' 8"  (1.727 m), weight 75.4 kg, SpO2 98 %. PHYSICAL EXAMINATION:  Physical Exam Constitutional:      General: She is not in acute distress. HENT:     Head: Normocephalic.     Mouth/Throat:     Mouth: Mucous membranes are moist.  Eyes:     General: No scleral icterus.    Conjunctiva/sclera: Conjunctivae normal.     Pupils: Pupils are equal, round, and reactive to light.  Neck:     Musculoskeletal: Normal range of motion and neck supple.     Vascular: No JVD.     Trachea: No tracheal deviation.  Cardiovascular:     Rate and Rhythm: Normal rate and regular rhythm.     Heart sounds: Normal heart sounds. No murmur. No gallop.   Pulmonary:     Effort: Pulmonary effort is normal. No respiratory distress.     Breath sounds: Normal breath sounds. No wheezing or rales.  Abdominal:     General: Bowel sounds are normal. There is no distension.     Palpations: Abdomen is soft.     Tenderness: There is abdominal tenderness (mild). There is no rebound.  Musculoskeletal: Normal range of motion.        General: No tenderness.     Right lower leg: No edema.     Left lower leg: No edema.  Skin:    Findings: No erythema or rash.  Neurological:     General: No focal deficit present.     Mental Status: She is alert and oriented to person, place, and time.     Cranial Nerves: No cranial nerve deficit.  Psychiatric:        Mood and Affect: Mood normal.    LABORATORY PANEL:  Female CBC Recent Labs  Lab 06/09/19 0613  WBC 19.2*  HGB 8.7*  HCT 29.7*  PLT 322   ------------------------------------------------------------------------------------------------------------------ Chemistries  Recent Labs  Lab 06/08/19 0431  06/10/19 0635  NA 136   < > 137  K 2.9*   < > 5.4*  CL 108   < > 111  CO2 22   < > 16*  GLUCOSE 144*   < >  88  BUN 10   < > 8  CREATININE 0.41*   < > 0.39*  CALCIUM 8.0*   < > 8.4*  MG 1.7  --   --   AST 34  --   --   ALT 22  --   --   ALKPHOS 183*  --   --   BILITOT 2.5*  --   --    < > = values in this interval not displayed.   RADIOLOGY:  Dg Abd 1 View  Result Date: 06/09/2019 CLINICAL DATA:  Abdominal pain. EXAM: ABDOMEN - 1 VIEW COMPARISON:  01/30/2019 FINDINGS: The bowel gas pattern is normal. No radio-opaque calculi or other significant radiographic abnormality are seen. Vascular clip at the GE junction noted. IMPRESSION: Negative. Electronically Signed   By: Fidela Salisbury M.D.   On: 06/09/2019 18:40    ASSESSMENT AND PLAN:   58 year old female with liver cirrhosis and portal hypertension who presents to the emergency room due to melena and hematemesis.  *    GI bleed from underlying liver cirrhosis/portal hypertension in the setting of using NSAIDs. The patient has been treated with PPI and octreotide S/p EGD: Grade II and large (> 5 mm) esophageal varices. Portal hypertensive gastropathy. Elective colonoscopy: Pseudomembranous enterocolitis, Blood in the entire examined colon and Non-bleeding internal hemorrhoids per Dr. Alice Reichert. Given octreotide and Protonix drip. Continue p.o. Protonix twice daily. CTA abdomen:  1. Hepatic cirrhosis with portal hypertension and small volume ascites. 2. Marked body wall edema consistent with anasarca. 3. Diffuse mild submucosal edema throughout the small bowel consistent with portal enteropathy.  Currently we are monitoring as per GI.  Hemoglobin is stable.  *  Acute on chronic blood loss anemia due to problem #1:  S/p 3 unit PRBC transfusion. Stable now.   *  Portal hypertension due to liver cirrhosis from chronic EtOH: Hold all medications including spironolactone, nadolol and Lasix for now due to low blood pressure and GI blood loss.  As per GI high rate of mortality in patient with SBP and ascites with beta-blockers so advised to stop.  *  EtOH abuse: Patient has not had any alcohol for several months  *   Tachycardia due to above: Continue telemetry monitoring.  Improved with IV fluid support.    * SBP On ceftriaxone. Repeat ascitic tap again showed significant number of white blood cells.  As patient white blood cell count continued to rise, after ID consult we switched to IV Zosyn. Plan for repeat acitic tap tomorrow.  *  Liver cirrhosis with ascites.  Abdominal ultrasound: moderate ascites.  S/p Paracentesis ( 10/5) yielding 2.4 liters of peritoneal fluid.  Had 1700 WBCs in fluid, Repeat paracentesis 10/8 -with again higher  leukocytes in the fluid. Patient's WBC is on CBC is also going up.  * Constipation, possible due to narcotics.  Advised patient avoid narcotics.  Laxatives.     Now have losoe stools, hold laxatives since 06/05/19  *  Diarrhea-C. difficile colitis    As patient had findings of pseudomembranous colitis, and her diarrhea persisted and white blood cell count went up-GI also agrees to start treating for C. difficile with oral vancomycin now. Appreciate help by ID.  * Tobacco abuse.  Smoking cessation was counseled for 3 to 4 minutes.  The patient refused nicotine patch.  All the records are reviewed and case discussed with Care Management/Social Worker. Management plans discussed with the patient, family and they are in agreement.  CODE STATUS: Full  Code  TOTAL TIME TAKING CARE OF THIS PATIENT: 42 minutes.   More than 50% of the time was spent in counseling/coordination of care: YES Discussed with ID and GI.  POSSIBLE D/C IN 2 DAYS, DEPENDING ON CLINICAL CONDITION.   Vaughan Basta M.D on 06/10/2019 at 2:11 PM  Between 7am to 6pm - Pager - [phone]  After 6pm go to [email or web address] - [facility]

## 2019-06-10 NOTE — Plan of Care (Signed)
Patient progressing with pain management. Tolerating PO pain medications only today and pain is staying at a manageable level, per patient.

## 2019-06-10 NOTE — Plan of Care (Signed)
  Problem: Education: Goal: Knowledge of General Education information will improve Description: Including pain rating scale, medication(s)/side effects and non-pharmacologic comfort measures Outcome: Progressing   Problem: Coping: Goal: Level of anxiety will decrease Outcome: Progressing   Problem: Pain Managment: Goal: General experience of comfort will improve Outcome: Progressing   

## 2019-06-10 NOTE — Progress Notes (Signed)
Anita Reed , MD [address] [place], [place], [place], [address] [address], [place], [place], [place], [address] Phone: [phone]  Fax: [phone]   Anita Reed is being followed for SBP and C. difficile diarrhea   Subjective: No diarrhea, no abdominal pain     Objective: Vital signs in last 24 hours: Vitals:   06/10/19 0655 06/10/19 0658 06/10/19 0746 06/10/19 1015  BP: 99/61  107/65   Pulse: 67  65 78  Resp: 20  18   Temp: 97.6 F (36.4 C)  97.7 F (36.5 C)   TempSrc: Oral     SpO2: 100%  98%   Weight:  75.4 kg    Height:       Weight change: -1.757 kg  Intake/Output Summary (Last 24 hours) at 06/10/2019 1205 Last data filed at 06/10/2019 0553 Gross per 24 hour  Intake 401.76 ml  Output 100 ml  Net 301.76 ml     Exam: Heart:: Regular rate and rhythm, S1S2 present or without murmur or extra heart sounds Lungs: normal, clear to auscultation and clear to auscultation and percussion Abdomen: soft, nontender, normal bowel sounds, mild distension    Lab Results: @LABTEST2 @ Micro Results: Recent Results (from the past 240 hour(s))  SARS Coronavirus 2 [facility] order, Performed in [place] hospital lab) Nasopharyngeal Nasopharyngeal Swab     Status: None   Collection Time: 06/01/19  1:58 PM   Specimen: Nasopharyngeal Swab  Result Value Ref Range Status   SARS Coronavirus 2 NEGATIVE NEGATIVE Final    Comment: (NOTE) If result is NEGATIVE SARS-CoV-2 target nucleic acids are NOT DETECTED. The SARS-CoV-2 RNA is generally detectable in upper and lower  respiratory specimens during the acute phase of infection. The lowest  concentration of SARS-CoV-2 viral copies this assay can detect is 250  copies / mL. A negative result does not preclude SARS-CoV-2 infection  and should not be used as the sole basis for treatment or other  patient management decisions.  A negative result may occur with  improper specimen collection / handling, submission of  specimen other  than nasopharyngeal swab, presence of viral mutation(s) within the  areas targeted by this assay, and inadequate number of viral copies  (<250 copies / mL). A negative result must be combined with clinical  observations, patient history, and epidemiological information. If result is POSITIVE SARS-CoV-2 target nucleic acids are DETECTED. The SARS-CoV-2 RNA is generally detectable in upper and lower  respiratory specimens dur ing the acute phase of infection.  Positive  results are indicative of active infection with SARS-CoV-2.  Clinical  correlation with patient history and other diagnostic information is  necessary to determine patient infection status.  Positive results do  not rule out bacterial infection or co-infection with other viruses. If result is PRESUMPTIVE POSTIVE SARS-CoV-2 nucleic acids MAY BE PRESENT.   A presumptive positive result was obtained on the submitted specimen  and confirmed on repeat testing.  While 2019 novel coronavirus  (SARS-CoV-2) nucleic acids may be present in the submitted sample  additional confirmatory testing may be necessary for epidemiological  and / or clinical management purposes  to differentiate between  SARS-CoV-2 and other Sarbecovirus currently known to infect humans.  If clinically indicated additional testing with an alternate test  methodology [phone]) is advised. The SARS-CoV-2 RNA is generally  detectable in upper and lower respiratory sp ecimens during the acute  phase of infection. The expected result is Negative. Fact Sheet for Patients:  StrictlyIdeas.no Fact  Sheet for Healthcare Providers: BankingDealers.co.za This test is not yet approved or cleared by the [place] and has been authorized for detection and/or diagnosis of SARS-CoV-2 by FDA under an Emergency Use Authorization (EUA).  This EUA will remain in effect (meaning this test can be used) for the  duration of the COVID-19 declaration under Section 564(b)(1) of the Act, 21 U.S.C. section 360bbb-3(b)(1), unless the authorization is terminated or revoked sooner. Performed at [facility], [address] [place], [place] [address]   Body fluid culture     Status: None   Collection Time: 06/04/19  1:50 PM   Specimen: PATH Cytology Peritoneal fluid  Result Value Ref Range Status   Specimen Description   Final    PERITONEAL Performed at [facility], [address], [place], [place] [address]    Special Requests   Final    NONE Performed at [facility], [address] [place], [place] [address]    Gram Stain   Final    RARE WBC PRESENT, PREDOMINANTLY PMN NO ORGANISMS SEEN    Culture   Final    NO GROWTH 3 DAYS Performed at [facility], [place] [address], [place], [place] [address]    Report Status 06/08/2019 FINAL  Final  Fungus Culture With Stain     Status: None (Preliminary result)   Collection Time: 06/04/19  1:50 PM   Specimen: PATH Cytology Peritoneal fluid  Result Value Ref Range Status   Fungus Stain Final report  Final    Comment: (NOTE) Performed At: [facility] [place], [place] [id number] Rush Farmer MD [id number]    Fungus (Mycology) Culture PENDING  Incomplete   Fungal Source PERITONEAL  Final    Comment: Performed at [facility], [address] [place], [place] [address]  Fungus Culture Result     Status: None   Collection Time: 06/04/19  1:50 PM  Result Value Ref Range Status   Result 1 Comment  Final    Comment: (NOTE) KOH/Calcofluor preparation:  no fungus observed. Performed At: [facility] [place], [place] [id number] Rush Farmer MD [id number]   Body fluid culture     Status: None   Collection Time: 06/07/19 11:00 AM   Specimen: PATH Cytology Peritoneal fluid  Result Value Ref Range Status   Specimen Description   Final    PLEURAL  Performed at [facility], [address], [place], [place] [address]    Special Requests   Final    NONE Performed at [facility], [address] [place], [place] [address]    Gram Stain   Final    WBC PRESENT,BOTH PMN AND MONONUCLEAR NO ORGANISMS SEEN CYTOSPIN SMEAR    Culture   Final    NO GROWTH 3 DAYS Performed at [facility] [address] [place], [place] [address]    Report Status 06/10/2019 FINAL  Final  C difficile quick scan w PCR reflex     Status: Abnormal   Collection Time: 06/08/19  5:01 PM   Specimen: STOOL  Result Value Ref Range Status   C Diff antigen POSITIVE (A) NEGATIVE Final   C Diff toxin NEGATIVE NEGATIVE Final   C Diff interpretation Results are indeterminate. See PCR results.  Final    Comment: Performed at [facility], [address] [place], [place] [address]  C. Diff by PCR, Reflexed     Status: Abnormal  Collection Time: 06/08/19  5:01 PM  Result Value Ref Range Status   Toxigenic C. Difficile by PCR POSITIVE (A) NEGATIVE Final    Comment: Positive for toxigenic C. difficile with little to no toxin production. Only treat if clinical presentation suggests symptomatic illness. Performed at [facility], [address] [place], [place] [address]    Studies/Results: Dg Abd 1 View  Result Date: 06/09/2019 CLINICAL DATA:  Abdominal pain. EXAM: ABDOMEN - 1 VIEW COMPARISON:  01/30/2019 FINDINGS: The bowel gas pattern is normal. No radio-opaque calculi or other significant radiographic abnormality are seen. Vascular clip at the GE junction noted. IMPRESSION: Negative. Electronically Signed   By: Fidela Salisbury M.D.   On: 06/09/2019 18:40   Medications: I have reviewed the patient's current medications. Scheduled Meds: . sodium chloride   Intravenous Once  . pantoprazole  40 mg Oral BID  . propranolol ER  60 mg Oral Daily  . rifaximin  550 mg Oral BID  . sodium chloride flush  3 mL  Intravenous Q12H  . vancomycin  125 mg Oral QID   Continuous Infusions: . sodium chloride Stopped (06/08/19 1033)  . piperacillin-tazobactam (ZOSYN)  IV 12.5 mL/hr at 06/10/19 0553   PRN Meds:.sodium chloride, metoprolol tartrate, morphine injection, ondansetron **OR** ondansetron (ZOFRAN) IV, oxyCODONE, sodium chloride flush   Assessment: Active Problems:   GIB (gastrointestinal bleeding)   GI bleeding  Anita Reed 58 y.o. female been followed for SBP and C. difficile colitis.  She has a history of ascites and cirrhosis.  Eventually being treated for active C. difficile diarrhea.  On treatment for SBP with Zosyn.No evidence of encephelopathy today   Plan: 1.  Low-sodium diet 2.  Continue Zosyn for SBP.   3.  Continue vancomycin for C. difficile diarrhea 4.  Eventually after completing treatment for SBP should need to go home on Bactrim DS 1 tablet a day for SBP prophylaxis. 5.  May need to consider stopping her beta-blocker as it increases mortality in patients with ascites and SBP. 6.  Monitor electrolytes and normalize if abnormal.  Continue rifaximin for hepatic encephalopathy.    LOS: 9 days   Anita Bellows, MD 06/10/2019, 12:05 PM

## 2019-06-10 NOTE — Plan of Care (Signed)
  Problem: Education: Goal: Knowledge of General Education information will improve Description: Including pain rating scale, medication(s)/side effects and non-pharmacologic comfort measures Outcome: Progressing   Problem: Pain Managment: Goal: General experience of comfort will improve Outcome: Progressing   

## 2019-06-11 ENCOUNTER — Inpatient Hospital Stay: Payer: Medicare Other

## 2019-06-11 DIAGNOSIS — Z978 Presence of other specified devices: Secondary | ICD-10-CM

## 2019-06-11 LAB — BODY FLUID CELL COUNT WITH DIFFERENTIAL
Eos, Fluid: 0 %
Lymphs, Fluid: 13 %
Monocyte-Macrophage-Serous Fluid: 32 %
Neutrophil Count, Fluid: 55 %
Total Nucleated Cell Count, Fluid: 650 cu mm

## 2019-06-11 LAB — CBC
HCT: 30.7 % — ABNORMAL LOW (ref 36.0–46.0)
Hemoglobin: 9.3 g/dL — ABNORMAL LOW (ref 12.0–15.0)
MCH: 25.9 pg — ABNORMAL LOW (ref 26.0–34.0)
MCHC: 30.3 g/dL (ref 30.0–36.0)
MCV: 85.5 fL (ref 80.0–100.0)
Platelets: 335 10*3/uL (ref 150–400)
RBC: 3.59 MIL/uL — ABNORMAL LOW (ref 3.87–5.11)
RDW: 22.3 % — ABNORMAL HIGH (ref 11.5–15.5)
WBC: 18.8 10*3/uL — ABNORMAL HIGH (ref 4.0–10.5)
nRBC: 0 % (ref 0.0–0.2)

## 2019-06-11 LAB — BASIC METABOLIC PANEL
Anion gap: 6 (ref 5–15)
BUN: 9 mg/dL (ref 6–20)
CO2: 21 mmol/L — ABNORMAL LOW (ref 22–32)
Calcium: 8.6 mg/dL — ABNORMAL LOW (ref 8.9–10.3)
Chloride: 110 mmol/L (ref 98–111)
Creatinine, Ser: 0.51 mg/dL (ref 0.44–1.00)
GFR calc Af Amer: >60 mL/min (ref >60)
GFR calc non Af Amer: >60 mL/min (ref >60)
Glucose, Bld: 91 mg/dL (ref 70–99)
Potassium: 4.2 mmol/L (ref 3.5–5.1)
Sodium: 137 mmol/L (ref 135–145)

## 2019-06-11 LAB — ALBUMIN, PLEURAL OR PERITONEAL FLUID: Albumin, Fluid: <1 g/dL

## 2019-06-11 LAB — PATHOLOGIST SMEAR REVIEW

## 2019-06-11 LAB — GLUCOSE, PLEURAL OR PERITONEAL FLUID: Glucose, Fluid: 94 mg/dL

## 2019-06-11 LAB — PROTEIN, PLEURAL OR PERITONEAL FLUID: Total protein, fluid: <3 g/dL

## 2019-06-11 IMAGING — US US PARACENTESIS
1 series · 4 of 4 positions shown · non-contrast
Comparison: none

INDICATION: Cirrhosis and ascites.

[Series 1: us paracentesis · 4 of 4 slices shown]
[im 1/4]
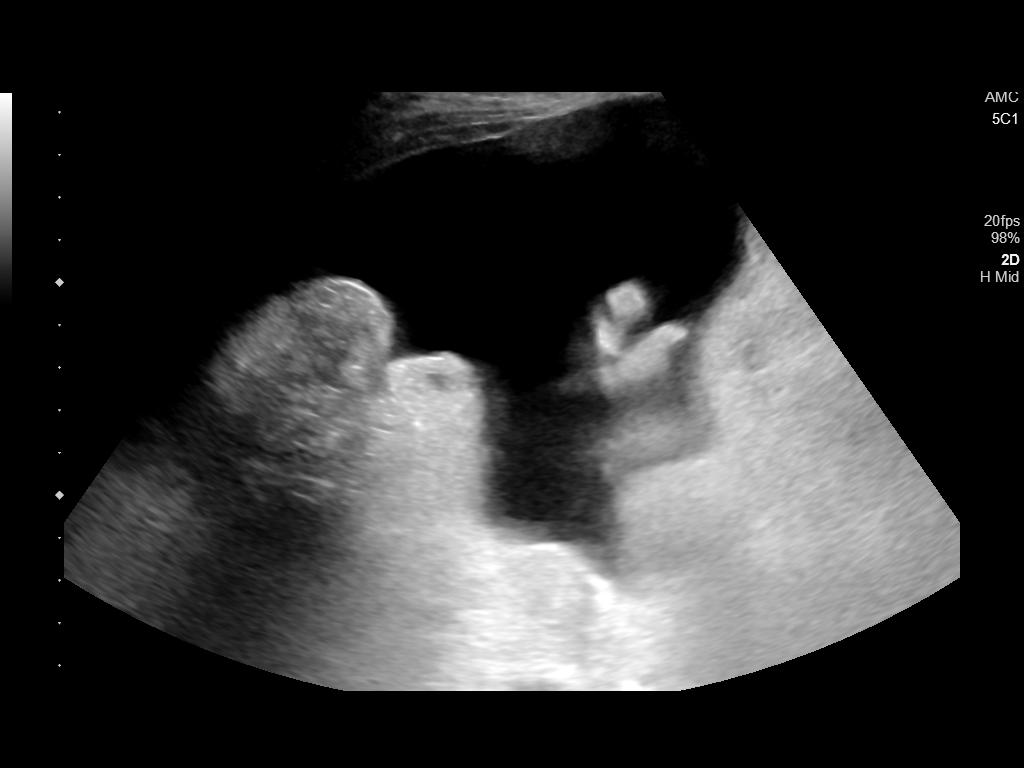
[im 2/4]
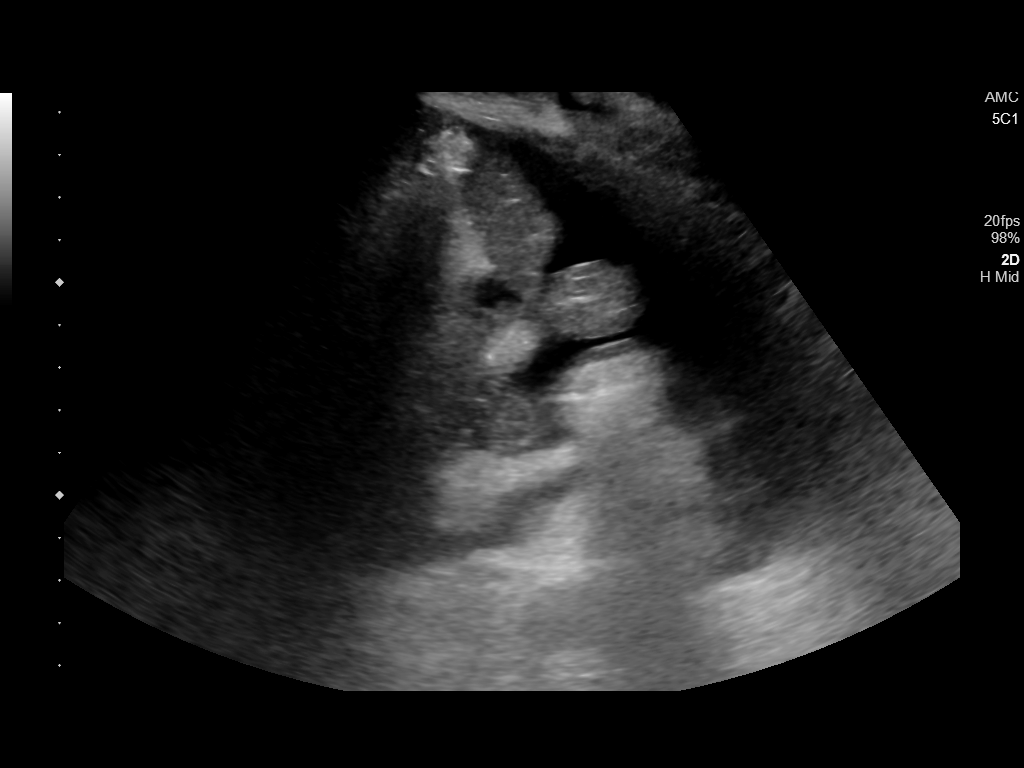
[im 3/4]
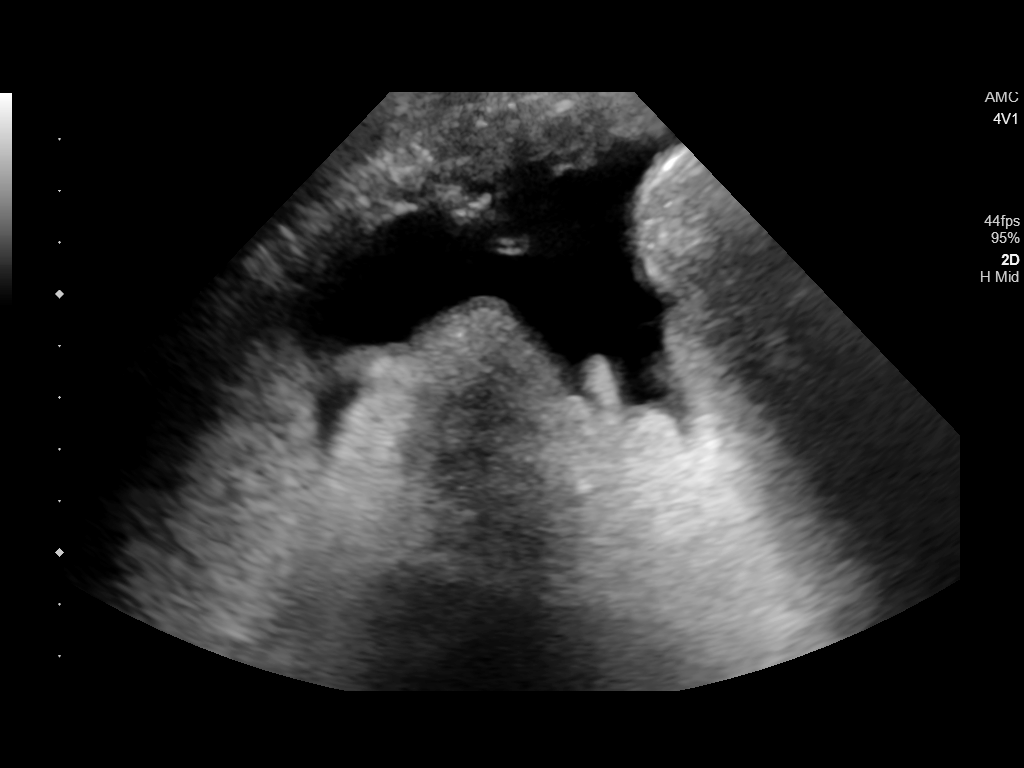
[im 4/4]
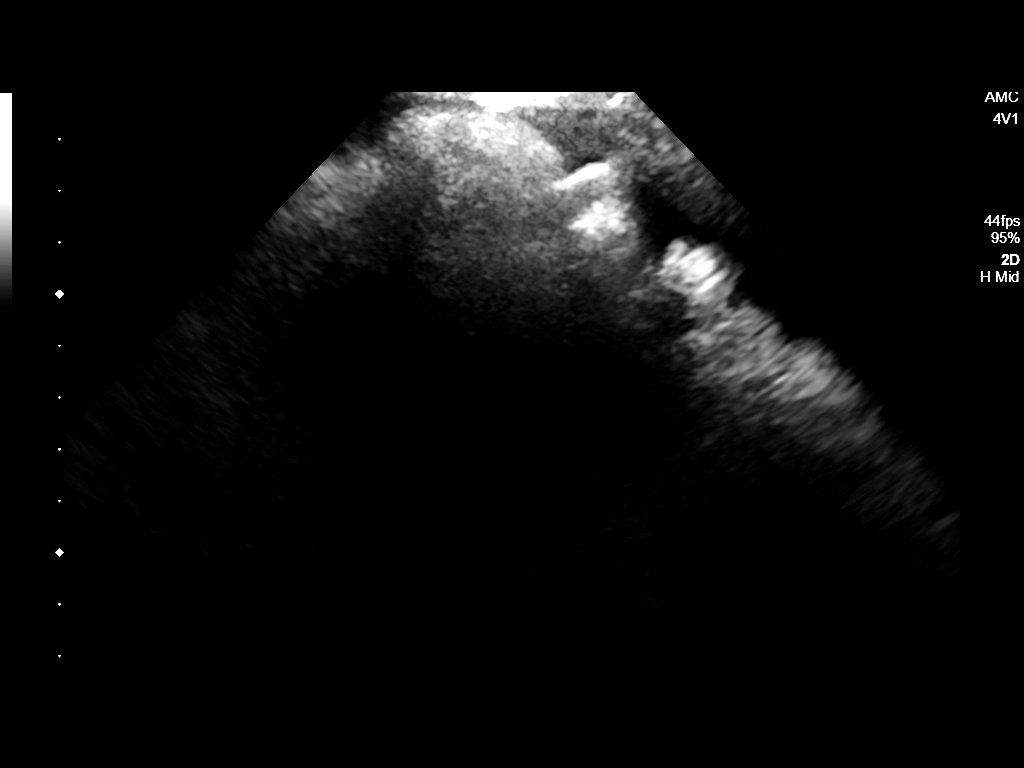

[4 of 4 positions shown; findings below may reference images not displayed]

EXAM:
ULTRASOUND GUIDED PARACENTESIS

MEDICATIONS:
None.

COMPLICATIONS:
None immediate.

PROCEDURE:
Informed written consent was obtained from the patient after a
discussion of the risks, benefits and alternatives to treatment. A
timeout was performed prior to the initiation of the procedure.

Initial ultrasound was performed to localize ascites. The right
lower abdomen was prepped and draped in the usual sterile fashion.
1% lidocaine was used for local anesthesia.

Following this, a 6 Fr Safe-T-Centesis catheter was introduced. An
ultrasound image was saved for documentation purposes. The
paracentesis was performed. The catheter was removed and a dressing
was applied. The patient tolerated the procedure well without
immediate post procedural complication.
FINDINGS: A total of approximately 2.3 L of yellow fluid was removed.
IMPRESSION: Successful ultrasound-guided paracentesis yielding 2.3 liters of
peritoneal fluid.

## 2019-06-11 MED ORDER — RIFAXIMIN 550 MG PO TABS: 550.0000 mg | ORAL_TABLET | Freq: Two times a day (BID) | ORAL | 0 refills | Status: DC

## 2019-06-11 MED ORDER — OXYCODONE HCL 5 MG PO TABS: 5.0000 mg | ORAL_TABLET | Freq: Four times a day (QID) | ORAL | 0 refills | Status: DC | PRN

## 2019-06-11 MED ORDER — VANCOMYCIN HCL 125 MG PO CAPS: 125.0000 mg | ORAL_CAPSULE | Freq: Four times a day (QID) | ORAL | 0 refills | Status: AC

## 2019-06-11 MED ORDER — ENSURE ENLIVE PO LIQD: 237.0000 mL | Freq: Two times a day (BID) | ORAL | Status: DC

## 2019-06-11 MED ORDER — SULFAMETHOXAZOLE-TRIMETHOPRIM 800-160 MG PO TABS: 1.0000 | ORAL_TABLET | Freq: Two times a day (BID) | ORAL | 0 refills | Status: AC

## 2019-06-11 NOTE — Progress Notes (Signed)
   Date of Admission:  06/01/2019       Subjective: Patient doing better wants to go home No GI bleed Abdominal pain much improved   Medications:  . sodium chloride   Intravenous Once  . pantoprazole  40 mg Oral BID  . rifaximin  550 mg Oral BID  . sodium chloride flush  3 mL Intravenous Q12H  . vancomycin  125 mg Oral QID    Objective: Vital signs in last 24 hours: Temp:  [97.5 F (36.4 C)-97.7 F (36.5 C)] 97.6 F (36.4 C) (10/12 0804) Pulse Rate:  [57-71] 57 (10/12 1017) Resp:  [17-19] 19 (10/12 0804) BP: (84-108)/(47-70) 84/47 (10/12 1017) SpO2:  [96 %-100 %] 96 % (10/12 1017)  PHYSICAL EXAM:  General: Alert, cooperative, no distress, appears stated age.  Lungs: Bilateral air entry decreased basis heart: Regular rate and rhythm, no murmur, rub or gallop. Abdomen: Soft, distended.  Tenderness at the site of the paracentesis in the right side extremities: Edematous skin: No rashes or lesions. Or bruising Lymph: Cervical, supraclavicular normal. Neurologic: Grossly non-focal  Lab Results Recent Labs    06/09/19 0613 06/10/19 0635 06/11/19 0424  WBC 19.2*  --  18.8*  HGB 8.7*  --  9.3*  HCT 29.7*  --  30.7*  NA 138 137 137  K 4.3 5.4* 4.2  CL 112* 111 110  CO2 20* 16* 21*  BUN 8 8 9   CREATININE 0.42* 0.39* 0.51   Liver Panel No results for input(s): PROT, ALBUMIN, AST, ALT, ALKPHOS, BILITOT, BILIDIR, IBILI in the last 72 hours. Sedimentation Rate No results for input(s): ESRSEDRATE in the last 72 hours. C-Reactive Protein No results for input(s): CRP in the last 72 hours.  Microbiology:  Studies/Results: Dg Abd 1 View  Result Date: 06/09/2019 CLINICAL DATA:  Abdominal pain. EXAM: ABDOMEN - 1 VIEW COMPARISON:  01/30/2019 FINDINGS: The bowel gas pattern is normal. No radio-opaque calculi or other significant radiographic abnormality are seen. Vascular clip at the GE junction noted. IMPRESSION: Negative. Electronically Signed   By: Fidela Salisbury  M.D.   On: 06/09/2019 18:40     Assessment/Plan:   58 year old female with history of cirrhosis of the liver decompensated with ascites, portal hypertension, GI bleed spontaneous bacterial peritonitis.  GI bleed.  Concern for ischemic colitis versus versus pseudomembranous colitis.  Now has C. difficile and is being treated for it.  C. difficile rarely causes hemorrhagic colitis  C. difficile diarrhea.  Wonder whether the C. difficile antigen being positive, toxin negative and PCR positive is a sign of colonization rather than true infection.  But a pseudomembrane was noted on 10 /4 on  colonoscopy even though pathology did not confirm it and the white count is high  patient has been started on vancomycin oral.  Ascites.  Spontaneous bacterial peritonitis has neutrocytic  ascites but cultures negative so far.  Repeat paracentesis today shows WBC decreased to 600 so she is currently on Zosyn on discharge can be switched to Bactrim for another 7 days.  Avoid quinolones or cephalosporins as risk for worsening C. difficile   Decompensated liver cirrhosis on propanolol, diuretic  Discussed the management with the patient and Dr. Anselm Jungling

## 2019-06-11 NOTE — Progress Notes (Signed)
Patient discharged home tonight with husband, follow up information provided, IV's removed, prescription information provided no further issues or concerns at this time.

## 2019-06-11 NOTE — Plan of Care (Signed)
  Problem: Clinical Measurements: Goal: Ability to maintain clinical measurements within normal limits will improve 06/11/2019 1754 by Alen Blew, RN Outcome: Adequate for Discharge 06/11/2019 1745 by Alen Blew, RN Outcome: Adequate for Discharge Goal: Will remain free from infection 06/11/2019 1754 by Alen Blew, RN Outcome: Adequate for Discharge 06/11/2019 1745 by Alen Blew, RN Outcome: Adequate for Discharge Goal: Diagnostic test results will improve 06/11/2019 1754 by Alen Blew, RN Outcome: Adequate for Discharge 06/11/2019 1745 by Alen Blew, RN Outcome: Adequate for Discharge Goal: Respiratory complications will improve 06/11/2019 1754 by Alen Blew, RN Outcome: Adequate for Discharge 06/11/2019 1745 by Alen Blew, RN Outcome: Adequate for Discharge Goal: Cardiovascular complication will be avoided 06/11/2019 1754 by Alen Blew, RN Outcome: Adequate for Discharge 06/11/2019 1745 by Alen Blew, RN Outcome: Adequate for Discharge   Problem: Activity: Goal: Risk for activity intolerance will decrease 06/11/2019 1754 by Alen Blew, RN Outcome: Adequate for Discharge 06/11/2019 1745 by Alen Blew, RN Outcome: Adequate for Discharge   Problem: Elimination: Goal: Will not experience complications related to bowel motility 06/11/2019 1754 by Alen Blew, RN Outcome: Adequate for Discharge 06/11/2019 1745 by Alen Blew, RN Outcome: Adequate for Discharge Goal: Will not experience complications related to urinary retention 06/11/2019 1754 by Alen Blew, RN Outcome: Adequate for Discharge 06/11/2019 1745 by Alen Blew, RN Outcome: Adequate for Discharge   Problem: Coping: Goal: Level of anxiety will decrease 06/11/2019 1754 by Alen Blew, RN Outcome: Adequate for Discharge 06/11/2019 1745 by Alen Blew, RN Outcome: Adequate for Discharge

## 2019-06-11 NOTE — Progress Notes (Signed)
Initial Nutrition Assessment  RD working remotely.  DOCUMENTATION CODES:   Not applicable  INTERVENTION:   - Ensure Enlive po BID, each supplement provides 350 kcal and 20 grams of protein  - Encourage adequate PO intake  NUTRITION DIAGNOSIS:   Increased nutrient needs related to chronic illness (cirrhosis) as evidenced by estimated needs.  GOAL:   Patient will meet greater than or equal to 90% of their needs  MONITOR:   PO intake, Supplement acceptance, Labs, Weight trends, Skin, I & O's  REASON FOR ASSESSMENT:   Consult Assessment of nutrition requirement/status  ASSESSMENT:   58 year old female who presented to the ED on 10/2 with melena and hematemesis. PMH of atrial flutter, alcoholic cirrhosis with ascites, portal HTN.   10/2 - s/p EGD showing large grade II esophageal varices and portal hypertensive gastropathy 10/4 - s/p colonoscopy showing pseudomembranous enterocolitis, blood in colon, erythematous mucosa in rectal 10/5 - s/p paracentesis 10/8 - s/p paracentesis  GI and ID following pt for SBP and C difficile.  Reviewed weight history in chart. Weight currently up 17 lbs since admit. Suspect this is related to positive fluid balance and ascites. Per RN edema assessment, pt with mild pitting edema to BLE.  Spoke with pt briefly via phone call to room. Pt unwilling to speak with RD for long due to wanting to check whether she would be able to go home today. Pt states that she "ate good" today but unable to provide additional information when asked about breakfast and lunch meals. Pt perseverating on whether or not she will be able to go home today.  RD attempted to obtain diet and weight history PTA. When asked what pt's UBW was, pt states, "yeah, it's all be taken care of." RD could hear pt speaking with someone else in the room. Unable to obtain history at this time.  RD will order oral nutrition supplements to aid pt in meeting kcal and protein  needs.  Meal Completion: 100% x 1 meal on 10/8, 95% x 1 meal on 10/9  Medications reviewed and include: Protonix, IV Zosyn  Labs reviewed: hemoglobin 9.3  UOP: 700 ml x 24 hours I/O's: +9.5 L since admit  NUTRITION - FOCUSED PHYSICAL EXAM:  Unable to complete at this time. RD working remotely.  Diet Order:   Diet Order            Diet 2 gram sodium Room service appropriate? Yes; Fluid consistency: Thin  Diet effective now        Diet - low sodium heart healthy              EDUCATION NEEDS:   Not appropriate for education at this time  Skin:  Skin Assessment: Skin Integrity Issues: Skin Integrity Issues: Incisions: closed incision to right flank  Last BM:  06/11/19  Height:   Ht Readings from Last 1 Encounters:  06/01/19 5\' 8"  (1.727 m)    Weight:   Wt Readings from Last 1 Encounters:  06/10/19 75.4 kg    Ideal Body Weight:  63.6 kg  BMI:  Body mass index is 25.27 kg/m.  Estimated Nutritional Needs:   Kcal:  2000-2200  Protein:  100-115 grams  Fluid:  >/= 1.8 L    Gaynell Face, MS, RD, LDN Inpatient Clinical Dietitian Pager: [phone] Weekend/After Hours: [phone]

## 2019-06-11 NOTE — Plan of Care (Signed)
  Problem: Education: Goal: Knowledge of General Education information will improve Description: Including pain rating scale, medication(s)/side effects and non-pharmacologic comfort measures Outcome: Adequate for Discharge   Problem: Health Behavior/Discharge Planning: Goal: Ability to manage health-related needs will improve Outcome: Adequate for Discharge   Problem: Clinical Measurements: Goal: Ability to maintain clinical measurements within normal limits will improve Outcome: Adequate for Discharge Goal: Will remain free from infection Outcome: Adequate for Discharge Goal: Diagnostic test results will improve Outcome: Adequate for Discharge Goal: Respiratory complications will improve Outcome: Adequate for Discharge Goal: Cardiovascular complication will be avoided Outcome: Adequate for Discharge   Problem: Activity: Goal: Risk for activity intolerance will decrease Outcome: Adequate for Discharge   Problem: Nutrition: Goal: Adequate nutrition will be maintained Outcome: Adequate for Discharge   Problem: Coping: Goal: Level of anxiety will decrease Outcome: Adequate for Discharge   

## 2019-06-11 NOTE — Care Management Important Message (Signed)
Important Message  Patient Details  Name: Oradell Skogstad MRN: [id number] Date of Birth: 26-Feb-1961   Medicare Important Message Given:  Yes     Ross Ludwig, LCSW 06/11/2019, 4:06 PM

## 2019-06-11 NOTE — Discharge Summary (Signed)
[facility] NAME: Anita Reed    MR#:  [id number]  DATE OF BIRTH:  Aug 17, 1961  DATE OF ADMISSION:  06/01/2019 ADMITTING PHYSICIAN: Epifanio Lesches, MD  DATE OF DISCHARGE: 06/11/2019   PRIMARY CARE PHYSICIAN: Hubbard Hartshorn, FNP    ADMISSION DIAGNOSIS:  Acute upper GI bleeding [K92.2]  DISCHARGE DIAGNOSIS:  Active Problems:   GIB (gastrointestinal bleeding)   GI bleeding   SECONDARY DIAGNOSIS:   Past Medical History:  Diagnosis Date  . Anemia   . Cancer ([place])   . Cirrhosis ([facility])   . Hypertension     HOSPITAL COURSE:   58 year old female with liver cirrhosis and portal hypertension who presents to the emergency room due to melena and hematemesis.  *   GI bleed from underlying liver cirrhosis/portal hypertension in the setting of using NSAIDs. The patient has been treated with PPI and octreotide S/p EGD: Grade II and large (>5 mm) esophageal varices. Portal hypertensive gastropathy. Elective colonoscopy: Pseudomembranous enterocolitis, Blood in the entire examined colon and Non-bleeding internal hemorrhoids per Dr. Alice Reichert. Given octreotide and Protonix drip. Continue p.o. Protonix twice daily. CTA abdomen:  1. Hepatic cirrhosis with portal hypertension and small volume ascites. 2. Marked body wall edema consistent with anasarca. 3. Diffuse mild submucosal edema throughout the small bowel consistent with portal enteropathy.  Currently we are monitoring as per GI.  Hemoglobin is stable.  No more bleeding and patient is feeling much better.  * Acute on chronic blood loss anemia due to problem #1:  S/p 3 unit PRBC transfusion. Stable now.   * Portal hypertension due to liver cirrhosis from chronic EtOH: Hold all medications including spironolactone, nadolol and Lasix for now due to low blood pressure and GI blood loss.  As per GI high rate of mortality in patient with SBP and ascites with  beta-blockers so advised to stop. Blood pressure is stable here.  Advised to follow with GI clinic currently we are holding spironolactone and diuretics.  * EtOH abuse: Patient has not had any alcohol for several months  *  Tachycardia due to above: Continue telemetry monitoring.  Improved with IV fluid support.    Stable now.  * SBP On ceftriaxone. Repeat ascitic tap again showed significant number of white blood cells.  As patient white blood cell count continued to rise, after ID consult we switched to IV Zosyn. Repeat acetic tap done 06/11/19-with significant improvement on white blood cells and patient does not have abdominal pain now and feels much better. ID and GI agreed on oral Bactrim for 7 days as outpatient for this.  *  Liver cirrhosis with ascites.  Abdominal ultrasound: moderate ascites.  S/p Paracentesis ( 10/5) yielding 2.4 liters of peritoneal fluid.  Had 1700 WBCs in fluid, Repeat paracentesis 10/8 -with again higher leukocytes in the fluid. Patient is improved now.  Advised to follow in GI clinic as outpatient.  * Constipation, possible due to narcotics.  Advised patient avoid narcotics.  Laxatives.     Now have losoe stools, hold laxatives since 06/05/19  *  Diarrhea-C. difficile colitis    As patient had findings of pseudomembranous colitis, and her diarrhea persisted and white blood cell count went up-GI also agrees to start treating for C. difficile with oral vancomycin now. Appreciate help by ID. Advised to give oral vancomycin for 10 days at discharge and follow in GI clinic.  * Tobacco abuse.  Smoking cessation was counseled for 3 to  4 minutes.  The patient refused nicotine patch.   DISCHARGE CONDITIONS:  Stable  CONSULTS OBTAINED:  Treatment Team:  Efrain Sella, MD  DRUG ALLERGIES:  No Known Allergies  DISCHARGE MEDICATIONS:   Allergies as of 06/11/2019   No Known Allergies     Medication List    STOP taking these medications    furosemide 40 MG tablet Commonly known as: Lasix   linaclotide 145 MCG Caps capsule Commonly known as: LINZESS   nadolol 20 MG tablet Commonly known as: CORGARD   Potassium Chloride ER 20 MEQ Tbcr   spironolactone 50 MG tablet Commonly known as: ALDACTONE     TAKE these medications   oxyCODONE 5 MG immediate release tablet Commonly known as: Oxy IR/ROXICODONE Take 1 tablet (5 mg total) by mouth every 6 (six) hours as needed for moderate pain.   pantoprazole 40 MG tablet Commonly known as: Protonix Take 1 tablet (40 mg total) by mouth 2 (two) times daily.   rifaximin 550 MG Tabs tablet Commonly known as: XIFAXAN Take 1 tablet (550 mg total) by mouth 2 (two) times daily.   sulfamethoxazole-trimethoprim 800-160 MG tablet Commonly known as: BACTRIM DS Take 1 tablet by mouth 2 (two) times daily for 7 days.   tiZANidine 4 MG tablet Commonly known as: ZANAFLEX Take 0.5-1 tablets (2-4 mg total) by mouth every 8 (eight) hours as needed for muscle spasms.   vancomycin 125 MG capsule Commonly known as: Vancocin HCl Take 1 capsule (125 mg total) by mouth 4 (four) times daily for 10 days.        DISCHARGE INSTRUCTIONS:    Follow-up GI clinic in 2 weeks.  If you experience worsening of your admission symptoms, develop shortness of breath, life threatening emergency, suicidal or homicidal thoughts you must seek medical attention immediately by calling 911 or calling your MD immediately  if symptoms less severe.  You Must read complete instructions/literature along with all the possible adverse reactions/side effects for all the Medicines you take and that have been prescribed to you. Take any new Medicines after you have completely understood and accept all the possible adverse reactions/side effects.   Please note  You were cared for by a hospitalist during your hospital stay. If you have any questions about your discharge medications or the care you received while you were  in the hospital after you are discharged, you can call the unit and asked to speak with the hospitalist on call if the hospitalist that took care of you is not available. Once you are discharged, your primary care physician will handle any further medical issues. Please note that NO REFILLS for any discharge medications will be authorized once you are discharged, as it is imperative that you return to your primary care physician (or establish a relationship with a primary care physician if you do not have one) for your aftercare needs so that they can reassess your need for medications and monitor your lab values.    Today   CHIEF COMPLAINT:   Chief Complaint  Patient presents with  . Abdominal Pain  . GI Bleeding    HISTORY OF PRESENT ILLNESS:  Anita Reed  is a 58 y.o. female with a known history of liver cirrhosis and portal hypertension due to chronic EtOH use who presents today to the emergency room due to abdominal pain and vomiting blood.  Patient reports she has been taking and a numerous amount of Advil due to her chronic pain and over the past  24 hours she has had melena as well as hematemesis.  In the emergency room her hemoglobin is 5.  She is being transfused 2 units PRBC.  Her blood pressure is low.  She does have dizziness but denies shortness of breath or chest pain.   VITAL SIGNS:  Blood pressure (!) 106/59, pulse 80, temperature 97.6 F (36.4 C), temperature source Oral, resp. rate 19, height 5\' 8"  (1.727 m), weight 75.4 kg, SpO2 99 %.  I/O:    Intake/Output Summary (Last 24 hours) at 06/11/2019 1635 Last data filed at 06/11/2019 [id number] Gross per 24 hour  Intake 156.04 ml  Output 700 ml  Net -543.96 ml    PHYSICAL EXAMINATION:   Constitutional:      General: She is not in acute distress. HENT:     Head: Normocephalic.     Mouth/Throat:     Mouth: Mucous membranes are moist.  Eyes:     General: No scleral icterus.    Conjunctiva/sclera: Conjunctivae normal.      Pupils: Pupils are equal, round, and reactive to light.  Neck:     Musculoskeletal: Normal range of motion and neck supple.     Vascular: No JVD.     Trachea: No tracheal deviation.  Cardiovascular:     Rate and Rhythm: Normal rate and regular rhythm.     Heart sounds: Normal heart sounds. No murmur. No gallop.   Pulmonary:     Effort: Pulmonary effort is normal. No respiratory distress.     Breath sounds: Normal breath sounds. No wheezing or rales.  Abdominal:     General: Bowel sounds are normal. There is no distension.     Palpations: Abdomen is soft.     Tenderness: There is abdominal tenderness (mild). There is no rebound.  Musculoskeletal: Normal range of motion.        General: No tenderness.     Right lower leg: No edema.     Left lower leg: No edema.  Skin:    Findings: No erythema or rash.  Neurological:     General: No focal deficit present.     Mental Status: She is alert and oriented to person, place, and time.     Cranial Nerves: No cranial nerve deficit.  Psychiatric:        Mood and Affect: Mood normal.   DATA REVIEW:   CBC Recent Labs  Lab 06/11/19 0424  WBC 18.8*  HGB 9.3*  HCT 30.7*  PLT 335    Chemistries  Recent Labs  Lab 06/08/19 0431  06/11/19 0424  NA 136   < > 137  K 2.9*   < > 4.2  CL 108   < > 110  CO2 22   < > 21*  GLUCOSE 144*   < > 91  BUN 10   < > 9  CREATININE 0.41*   < > 0.51  CALCIUM 8.0*   < > 8.6*  MG 1.7  --   --   AST 34  --   --   ALT 22  --   --   ALKPHOS 183*  --   --   BILITOT 2.5*  --   --    < > = values in this interval not displayed.    Cardiac Enzymes No results for input(s): TROPONINI in the last 168 hours.  Microbiology Results  Results for orders placed or performed during the hospital encounter of 06/01/19  SARS Coronavirus 2 [facility] order, Performed in [facility]  Health hospital lab) Nasopharyngeal Nasopharyngeal Swab     Status: None   Collection Time: 06/01/19  1:58 PM   Specimen: Nasopharyngeal  Swab  Result Value Ref Range Status   SARS Coronavirus 2 NEGATIVE NEGATIVE Final    Comment: (NOTE) If result is NEGATIVE SARS-CoV-2 target nucleic acids are NOT DETECTED. The SARS-CoV-2 RNA is generally detectable in upper and lower  respiratory specimens during the acute phase of infection. The lowest  concentration of SARS-CoV-2 viral copies this assay can detect is 250  copies / mL. A negative result does not preclude SARS-CoV-2 infection  and should not be used as the sole basis for treatment or other  patient management decisions.  A negative result may occur with  improper specimen collection / handling, submission of specimen other  than nasopharyngeal swab, presence of viral mutation(s) within the  areas targeted by this assay, and inadequate number of viral copies  (<250 copies / mL). A negative result must be combined with clinical  observations, patient history, and epidemiological information. If result is POSITIVE SARS-CoV-2 target nucleic acids are DETECTED. The SARS-CoV-2 RNA is generally detectable in upper and lower  respiratory specimens dur ing the acute phase of infection.  Positive  results are indicative of active infection with SARS-CoV-2.  Clinical  correlation with patient history and other diagnostic information is  necessary to determine patient infection status.  Positive results do  not rule out bacterial infection or co-infection with other viruses. If result is PRESUMPTIVE POSTIVE SARS-CoV-2 nucleic acids MAY BE PRESENT.   A presumptive positive result was obtained on the submitted specimen  and confirmed on repeat testing.  While 2019 novel coronavirus  (SARS-CoV-2) nucleic acids may be present in the submitted sample  additional confirmatory testing may be necessary for epidemiological  and / or clinical management purposes  to differentiate between  SARS-CoV-2 and other Sarbecovirus currently known to infect humans.  If clinically indicated  additional testing with an alternate test  methodology [phone]) is advised. The SARS-CoV-2 RNA is generally  detectable in upper and lower respiratory sp ecimens during the acute  phase of infection. The expected result is Negative. Fact Sheet for Patients:  StrictlyIdeas.no Fact Sheet for Healthcare Providers: BankingDealers.co.za This test is not yet approved or cleared by the [place] FDA and has been authorized for detection and/or diagnosis of SARS-CoV-2 by FDA under an Emergency Use Authorization (EUA).  This EUA will remain in effect (meaning this test can be used) for the duration of the COVID-19 declaration under Section 564(b)(1) of the Act, 21 U.S.C. section 360bbb-3(b)(1), unless the authorization is terminated or revoked sooner. Performed at [facility], [address] [place], [place] [address]   Body fluid culture     Status: None   Collection Time: 06/04/19  1:50 PM   Specimen: PATH Cytology Peritoneal fluid  Result Value Ref Range Status   Specimen Description   Final    PERITONEAL Performed at [facility], [address] [place], [place] [address]    Special Requests   Final    NONE Performed at [facility], [address] [place], [place] [address]    Gram Stain   Final    RARE WBC PRESENT, PREDOMINANTLY PMN NO ORGANISMS SEEN    Culture   Final    NO GROWTH 3 DAYS Performed at [facility], [place] [address] [place], [place] [address]    Report Status 06/08/2019 FINAL  Final  Fungus Culture  With Stain     Status: None (Preliminary result)   Collection Time: 06/04/19  1:50 PM   Specimen: PATH Cytology Peritoneal fluid  Result Value Ref Range Status   Fungus Stain Final report  Final    Comment: (NOTE) Performed At: [facility] [place], [place] [id number] Rush Farmer MD [id number]    Fungus (Mycology) Culture PENDING  Incomplete    Fungal Source PERITONEAL  Final    Comment: Performed at [facility], [address] [place], [place] [address]  Fungus Culture Result     Status: None   Collection Time: 06/04/19  1:50 PM  Result Value Ref Range Status   Result 1 Comment  Final    Comment: (NOTE) KOH/Calcofluor preparation:  no fungus observed. Performed At: [facility] [place], [place] [id number] Rush Farmer MD [id number]   Body fluid culture     Status: None   Collection Time: 06/07/19 11:00 AM   Specimen: PATH Cytology Peritoneal fluid  Result Value Ref Range Status   Specimen Description   Final    PLEURAL Performed at [facility], [address], [place], [place] [address]    Special Requests   Final    NONE Performed at [facility], [address] [place], [place] [address]    Gram Stain   Final    WBC PRESENT,BOTH PMN AND MONONUCLEAR NO ORGANISMS SEEN CYTOSPIN SMEAR    Culture   Final    NO GROWTH 3 DAYS Performed at [facility] [address], [place], [place] [address]    Report Status 06/10/2019 FINAL  Final  C difficile quick scan w PCR reflex     Status: Abnormal   Collection Time: 06/08/19  5:01 PM   Specimen: STOOL  Result Value Ref Range Status   C Diff antigen POSITIVE (A) NEGATIVE Final   C Diff toxin NEGATIVE NEGATIVE Final   C Diff interpretation Results are indeterminate. See PCR results.  Final    Comment: Performed at [facility], [address] [place], [place] [address]  C. Diff by PCR, Reflexed     Status: Abnormal   Collection Time: 06/08/19  5:01 PM  Result Value Ref Range Status   Toxigenic C. Difficile by PCR POSITIVE (A) NEGATIVE Final    Comment: Positive for toxigenic C. difficile with little to no toxin production. Only treat if clinical presentation suggests symptomatic illness. Performed at [facility], [address] [place], [place] [address]     RADIOLOGY:  Bea Graff 1 View  Result Date: 06/09/2019 CLINICAL DATA:  Abdominal pain. EXAM: ABDOMEN - 1 VIEW COMPARISON:  01/30/2019 FINDINGS: The bowel gas pattern is normal. No radio-opaque calculi or other significant radiographic abnormality are seen. Vascular clip at the GE junction noted. IMPRESSION: Negative. Electronically Signed   By: Fidela Salisbury M.D.   On: 06/09/2019 18:40   US Paracentesis  Result Date: 06/11/2019 INDICATION: Cirrhosis and ascites. EXAM: ULTRASOUND GUIDED PARACENTESIS MEDICATIONS: None. COMPLICATIONS: None immediate. PROCEDURE: Informed written consent was obtained from the patient after a discussion of the risks, benefits and alternatives to treatment. A timeout was performed prior to the initiation of the procedure. Initial ultrasound was performed to localize ascites. The right lower abdomen was prepped and draped in the usual sterile fashion. 1% lidocaine was used for local anesthesia. Following this, a 6 Fr Safe-T-Centesis catheter was introduced. An ultrasound image was saved for documentation purposes. The paracentesis  was performed. The catheter was removed and a dressing was applied. The patient tolerated the procedure well without immediate post procedural complication. FINDINGS: A total of approximately 2.3 L of yellow fluid was removed. IMPRESSION: Successful ultrasound-guided paracentesis yielding 2.3 liters of peritoneal fluid. Electronically Signed   By: Aletta Edouard M.D.   On: 06/11/2019 12:20    EKG:   Orders placed or performed during the hospital encounter of 06/01/19  . ED EKG  . ED EKG  . EKG 12-Lead  . EKG 12-Lead      Management plans discussed with the patient, family and they are in agreement.  CODE STATUS: full.    Code Status Orders  (From admission, onward)         Start     Ordered   06/01/19 2149  Full code  Continuous     06/01/19 2148        Code Status History    Date Active Date Inactive Code Status Order ID Comments User  Context   01/28/2019 1920 01/31/2019 1827 Full Code [id number]  Otila Back, MD ED   01/18/2019 1651 01/19/2019 1946 DNR [id number]  Otila Back, MD ED   Advance Care Planning Activity      TOTAL TIME TAKING CARE OF THIS PATIENT: 35 minutes.    Vaughan Basta M.D on 06/11/2019 at 4:35 PM  Between 7am to 6pm - Pager - [phone]  After 6pm go to [email or web address] - password [email or web address] [facility]  Office  [phone]  CC: Primary care physician; Hubbard Hartshorn, FNP   Note: This dictation was prepared with Dragon dictation along with smaller phrase technology. Any transcriptional errors that result from this process are unintentional.

## 2019-06-12 LAB — PROTEIN, BODY FLUID (OTHER): Total Protein, Body Fluid Other: 0.7 g/dL

## 2019-06-15 LAB — BODY FLUID CULTURE: Culture: NO GROWTH

## 2019-06-22 ENCOUNTER — Inpatient Hospital Stay
Admission: EM | Admit: 2019-06-22 | Discharge: 2019-06-28 | DRG: 433 | Disposition: A | Discharge status: Home Health | Payer: Medicare Other | Attending: Internal Medicine | Admitting: Internal Medicine

## 2019-06-22 ENCOUNTER — Other Ambulatory Visit: Payer: Self-pay

## 2019-06-22 ENCOUNTER — Encounter: Payer: Self-pay | Admitting: Family Medicine

## 2019-06-22 ENCOUNTER — Encounter: Payer: Self-pay | Admitting: Emergency Medicine

## 2019-06-22 ENCOUNTER — Ambulatory Visit (INDEPENDENT_AMBULATORY_CARE_PROVIDER_SITE_OTHER): Payer: Medicare Other | Admitting: Family Medicine

## 2019-06-22 ENCOUNTER — Other Ambulatory Visit
Admission: RE | Admit: 2019-06-22 | Discharge: 2019-06-22 | Disposition: A | Discharge status: Home / Self Care | Payer: Medicare Other | Source: Home / Self Care | Attending: Family Medicine | Admitting: Family Medicine

## 2019-06-22 VITALS — BP 110/76 | HR 102 | Temp 97.5°F | Resp 16 | Ht 68.0 in | Wt 165.2 lb

## 2019-06-22 DIAGNOSIS — I959 Hypotension, unspecified: Secondary | ICD-10-CM | POA: Diagnosis not present

## 2019-06-22 DIAGNOSIS — Z79899 Other long term (current) drug therapy: Secondary | ICD-10-CM

## 2019-06-22 DIAGNOSIS — R140 Abdominal distension (gaseous): Secondary | ICD-10-CM

## 2019-06-22 DIAGNOSIS — F1010 Alcohol abuse, uncomplicated: Secondary | ICD-10-CM

## 2019-06-22 DIAGNOSIS — K7031 Alcoholic cirrhosis of liver with ascites: Secondary | ICD-10-CM

## 2019-06-22 DIAGNOSIS — K922 Gastrointestinal hemorrhage, unspecified: Secondary | ICD-10-CM | POA: Diagnosis not present

## 2019-06-22 DIAGNOSIS — K652 Spontaneous bacterial peritonitis: Secondary | ICD-10-CM | POA: Insufficient documentation

## 2019-06-22 DIAGNOSIS — E43 Unspecified severe protein-calorie malnutrition: Secondary | ICD-10-CM | POA: Diagnosis not present

## 2019-06-22 DIAGNOSIS — B182 Chronic viral hepatitis C: Secondary | ICD-10-CM | POA: Diagnosis present

## 2019-06-22 DIAGNOSIS — A0472 Enterocolitis due to Clostridium difficile, not specified as recurrent: Secondary | ICD-10-CM | POA: Diagnosis present

## 2019-06-22 DIAGNOSIS — A419 Sepsis, unspecified organism: Secondary | ICD-10-CM | POA: Diagnosis not present

## 2019-06-22 DIAGNOSIS — G8929 Other chronic pain: Secondary | ICD-10-CM | POA: Diagnosis present

## 2019-06-22 DIAGNOSIS — E871 Hypo-osmolality and hyponatremia: Secondary | ICD-10-CM | POA: Diagnosis present

## 2019-06-22 DIAGNOSIS — R109 Unspecified abdominal pain: Secondary | ICD-10-CM

## 2019-06-22 DIAGNOSIS — Z8572 Personal history of non-Hodgkin lymphomas: Secondary | ICD-10-CM

## 2019-06-22 DIAGNOSIS — D649 Anemia, unspecified: Secondary | ICD-10-CM

## 2019-06-22 DIAGNOSIS — D509 Iron deficiency anemia, unspecified: Secondary | ICD-10-CM | POA: Diagnosis present

## 2019-06-22 DIAGNOSIS — Z20828 Contact with and (suspected) exposure to other viral communicable diseases: Secondary | ICD-10-CM | POA: Diagnosis present

## 2019-06-22 DIAGNOSIS — Z8619 Personal history of other infectious and parasitic diseases: Secondary | ICD-10-CM

## 2019-06-22 DIAGNOSIS — E876 Hypokalemia: Secondary | ICD-10-CM

## 2019-06-22 DIAGNOSIS — R000 Tachycardia, unspecified: Secondary | ICD-10-CM | POA: Diagnosis not present

## 2019-06-22 DIAGNOSIS — K766 Portal hypertension: Secondary | ICD-10-CM | POA: Insufficient documentation

## 2019-06-22 DIAGNOSIS — I4892 Unspecified atrial flutter: Secondary | ICD-10-CM

## 2019-06-22 DIAGNOSIS — J9811 Atelectasis: Secondary | ICD-10-CM | POA: Diagnosis present

## 2019-06-22 DIAGNOSIS — R252 Cramp and spasm: Secondary | ICD-10-CM | POA: Insufficient documentation

## 2019-06-22 DIAGNOSIS — R600 Localized edema: Secondary | ICD-10-CM | POA: Diagnosis not present

## 2019-06-22 DIAGNOSIS — I10 Essential (primary) hypertension: Secondary | ICD-10-CM | POA: Diagnosis present

## 2019-06-22 DIAGNOSIS — K7290 Hepatic failure, unspecified without coma: Secondary | ICD-10-CM | POA: Diagnosis not present

## 2019-06-22 DIAGNOSIS — E8770 Fluid overload, unspecified: Secondary | ICD-10-CM | POA: Diagnosis not present

## 2019-06-22 DIAGNOSIS — I4891 Unspecified atrial fibrillation: Secondary | ICD-10-CM | POA: Diagnosis present

## 2019-06-22 DIAGNOSIS — F17210 Nicotine dependence, cigarettes, uncomplicated: Secondary | ICD-10-CM | POA: Diagnosis present

## 2019-06-22 DIAGNOSIS — I483 Typical atrial flutter: Secondary | ICD-10-CM | POA: Diagnosis present

## 2019-06-22 DIAGNOSIS — Z66 Do not resuscitate: Secondary | ICD-10-CM | POA: Diagnosis present

## 2019-06-22 DIAGNOSIS — Z515 Encounter for palliative care: Secondary | ICD-10-CM

## 2019-06-22 DIAGNOSIS — I9589 Other hypotension: Secondary | ICD-10-CM | POA: Diagnosis present

## 2019-06-22 DIAGNOSIS — K7460 Unspecified cirrhosis of liver: Secondary | ICD-10-CM | POA: Diagnosis not present

## 2019-06-22 DIAGNOSIS — Z90710 Acquired absence of both cervix and uterus: Secondary | ICD-10-CM

## 2019-06-22 DIAGNOSIS — Z7189 Other specified counseling: Secondary | ICD-10-CM | POA: Diagnosis not present

## 2019-06-22 DIAGNOSIS — R188 Other ascites: Secondary | ICD-10-CM | POA: Diagnosis not present

## 2019-06-22 DIAGNOSIS — R1084 Generalized abdominal pain: Secondary | ICD-10-CM

## 2019-06-22 DIAGNOSIS — Z9049 Acquired absence of other specified parts of digestive tract: Secondary | ICD-10-CM

## 2019-06-22 DIAGNOSIS — J90 Pleural effusion, not elsewhere classified: Secondary | ICD-10-CM | POA: Diagnosis not present

## 2019-06-22 DIAGNOSIS — G629 Polyneuropathy, unspecified: Secondary | ICD-10-CM | POA: Diagnosis present

## 2019-06-22 DIAGNOSIS — R609 Edema, unspecified: Secondary | ICD-10-CM

## 2019-06-22 DIAGNOSIS — R197 Diarrhea, unspecified: Secondary | ICD-10-CM | POA: Diagnosis not present

## 2019-06-22 LAB — URINALYSIS, ROUTINE W REFLEX MICROSCOPIC
Bilirubin Urine: NEGATIVE
Glucose, UA: NEGATIVE mg/dL
Hgb urine dipstick: NEGATIVE
Ketones, ur: NEGATIVE mg/dL
Leukocytes,Ua: NEGATIVE
Nitrite: NEGATIVE
Protein, ur: NEGATIVE mg/dL
Specific Gravity, Urine: 1.017 (ref 1.005–1.030)
pH: 6 (ref 5.0–8.0)

## 2019-06-22 LAB — COMPREHENSIVE METABOLIC PANEL
ALT: 20 U/L (ref 0–44)
ALT: 19 U/L (ref 0–44)
AST: 44 U/L — ABNORMAL HIGH (ref 15–41)
AST: 45 U/L — ABNORMAL HIGH (ref 15–41)
Albumin: 3 g/dL — ABNORMAL LOW (ref 3.5–5.0)
Albumin: 2.9 g/dL — ABNORMAL LOW (ref 3.5–5.0)
Alkaline Phosphatase: 458 U/L — ABNORMAL HIGH (ref 38–126)
Alkaline Phosphatase: 438 U/L — ABNORMAL HIGH (ref 38–126)
Anion gap: 14 (ref 5–15)
Anion gap: 13 (ref 5–15)
BUN: 8 mg/dL (ref 6–20)
BUN: 8 mg/dL (ref 6–20)
CO2: 25 mmol/L (ref 22–32)
CO2: 25 mmol/L (ref 22–32)
Calcium: 8.4 mg/dL — ABNORMAL LOW (ref 8.9–10.3)
Calcium: 8.2 mg/dL — ABNORMAL LOW (ref 8.9–10.3)
Chloride: 94 mmol/L — ABNORMAL LOW (ref 98–111)
Chloride: 93 mmol/L — ABNORMAL LOW (ref 98–111)
Creatinine, Ser: 0.68 mg/dL (ref 0.44–1.00)
Creatinine, Ser: 0.62 mg/dL (ref 0.44–1.00)
GFR calc Af Amer: >60 mL/min (ref >60)
GFR calc Af Amer: >60 mL/min (ref >60)
GFR calc non Af Amer: >60 mL/min (ref >60)
GFR calc non Af Amer: >60 mL/min (ref >60)
Glucose, Bld: 123 mg/dL — ABNORMAL HIGH (ref 70–99)
Glucose, Bld: 117 mg/dL — ABNORMAL HIGH (ref 70–99)
Potassium: 2.6 mmol/L — CL (ref 3.5–5.1)
Potassium: 2.5 mmol/L — CL (ref 3.5–5.1)
Sodium: 133 mmol/L — ABNORMAL LOW (ref 135–145)
Sodium: 131 mmol/L — ABNORMAL LOW (ref 135–145)
Total Bilirubin: 2.2 mg/dL — ABNORMAL HIGH (ref 0.3–1.2)
Total Bilirubin: 2.1 mg/dL — ABNORMAL HIGH (ref 0.3–1.2)
Total Protein: 5.7 g/dL — ABNORMAL LOW (ref 6.5–8.1)
Total Protein: 5.5 g/dL — ABNORMAL LOW (ref 6.5–8.1)

## 2019-06-22 LAB — CBC WITH DIFFERENTIAL/PLATELET
Abs Immature Granulocytes: 0.05 10*3/uL (ref 0.00–0.07)
Abs Immature Granulocytes: 0.05 10*3/uL (ref 0.00–0.07)
Basophils Absolute: 0.1 10*3/uL (ref 0.0–0.1)
Basophils Absolute: 0.1 10*3/uL (ref 0.0–0.1)
Basophils Relative: 1 %
Basophils Relative: 1 %
Eosinophils Absolute: 0.1 10*3/uL (ref 0.0–0.5)
Eosinophils Absolute: 0.2 10*3/uL (ref 0.0–0.5)
Eosinophils Relative: 1 %
Eosinophils Relative: 1 %
HCT: 33 % — ABNORMAL LOW (ref 36.0–46.0)
HCT: 33.2 % — ABNORMAL LOW (ref 36.0–46.0)
Hemoglobin: 10.4 g/dL — ABNORMAL LOW (ref 12.0–15.0)
Hemoglobin: 10.4 g/dL — ABNORMAL LOW (ref 12.0–15.0)
Immature Granulocytes: 0 %
Immature Granulocytes: 0 %
Lymphocytes Relative: 15 %
Lymphocytes Relative: 13 %
Lymphs Abs: 1.9 10*3/uL (ref 0.7–4.0)
Lymphs Abs: 1.6 10*3/uL (ref 0.7–4.0)
MCH: 25.1 pg — ABNORMAL LOW (ref 26.0–34.0)
MCH: 24.8 pg — ABNORMAL LOW (ref 26.0–34.0)
MCHC: 31.5 g/dL (ref 30.0–36.0)
MCHC: 31.3 g/dL (ref 30.0–36.0)
MCV: 79.5 fL — ABNORMAL LOW (ref 80.0–100.0)
MCV: 79 fL — ABNORMAL LOW (ref 80.0–100.0)
Monocytes Absolute: 1.2 10*3/uL — ABNORMAL HIGH (ref 0.1–1.0)
Monocytes Absolute: 1 10*3/uL (ref 0.1–1.0)
Monocytes Relative: 9 %
Monocytes Relative: 8 %
Neutro Abs: 9.9 10*3/uL — ABNORMAL HIGH (ref 1.7–7.7)
Neutro Abs: 9.5 10*3/uL — ABNORMAL HIGH (ref 1.7–7.7)
Neutrophils Relative %: 74 %
Neutrophils Relative %: 77 %
Platelets: 223 10*3/uL (ref 150–400)
Platelets: 222 10*3/uL (ref 150–400)
RBC: 4.15 MIL/uL (ref 3.87–5.11)
RBC: 4.2 MIL/uL (ref 3.87–5.11)
RDW: 19.2 % — ABNORMAL HIGH (ref 11.5–15.5)
RDW: 19.1 % — ABNORMAL HIGH (ref 11.5–15.5)
WBC: 13.3 10*3/uL — ABNORMAL HIGH (ref 4.0–10.5)
WBC: 12.3 10*3/uL — ABNORMAL HIGH (ref 4.0–10.5)
nRBC: 0 % (ref 0.0–0.2)
nRBC: 0 % (ref 0.0–0.2)

## 2019-06-22 LAB — LACTIC ACID, PLASMA
Lactic Acid, Venous: 1.9 mmol/L (ref 0.5–1.9)
Lactic Acid, Venous: 1.6 mmol/L (ref 0.5–1.9)
Lactic Acid, Venous: 1.9 mmol/L (ref 0.5–1.9)

## 2019-06-22 LAB — BODY FLUID CELL COUNT WITH DIFFERENTIAL
Eos, Fluid: 0 %
Lymphs, Fluid: 21 %
Monocyte-Macrophage-Serous Fluid: 62 %
Neutrophil Count, Fluid: 17 %
Total Nucleated Cell Count, Fluid: 273 cu mm

## 2019-06-22 LAB — LACTATE DEHYDROGENASE, PLEURAL OR PERITONEAL FLUID: LD, Fluid: 33 U/L — ABNORMAL HIGH (ref 3–23)

## 2019-06-22 LAB — ALBUMIN, PLEURAL OR PERITONEAL FLUID: Albumin, Fluid: <1 g/dL

## 2019-06-22 LAB — GLUCOSE, PLEURAL OR PERITONEAL FLUID: Glucose, Fluid: 112 mg/dL

## 2019-06-22 LAB — PROTIME-INR
INR: 1.2 (ref 0.8–1.2)
Prothrombin Time: 14.7 seconds (ref 11.4–15.2)

## 2019-06-22 LAB — PROCALCITONIN: Procalcitonin: 0.4 ng/mL

## 2019-06-22 LAB — PROTEIN, PLEURAL OR PERITONEAL FLUID: Total protein, fluid: <3 g/dL

## 2019-06-22 LAB — SARS CORONAVIRUS 2 BY RT PCR (HOSPITAL ORDER, PERFORMED IN ~~LOC~~ HOSPITAL LAB): SARS Coronavirus 2: NEGATIVE

## 2019-06-22 LAB — MAGNESIUM: Magnesium: 1.5 mg/dL — ABNORMAL LOW (ref 1.7–2.4)

## 2019-06-22 LAB — APTT: aPTT: 30 seconds (ref 24–36)

## 2019-06-22 LAB — AMYLASE, PLEURAL OR PERITONEAL FLUID: Amylase, Fluid: 13 U/L

## 2019-06-22 MED ORDER — HYDROMORPHONE HCL 1 MG/ML IJ SOLN
0.5000 mg | Freq: Once | INTRAMUSCULAR | Status: AC
  Administered 2019-06-22: 0.5 mg via INTRAVENOUS

## 2019-06-22 MED ORDER — ONDANSETRON HCL 4 MG PO TABS
4.0000 mg | ORAL_TABLET | Freq: Four times a day (QID) | ORAL | Status: DC | PRN
  Filled 2019-06-22: qty 1

## 2019-06-22 MED ORDER — DIGOXIN 0.25 MG/ML IJ SOLN
0.1250 mg | Freq: Once | INTRAMUSCULAR | Status: AC
  Administered 2019-06-22: 20:00:00 0.125 mg via INTRAVENOUS
  Filled 2019-06-22: qty 2

## 2019-06-22 MED ORDER — POTASSIUM CHLORIDE CRYS ER 20 MEQ PO TBCR
40.0000 meq | EXTENDED_RELEASE_TABLET | Freq: Once | ORAL | Status: AC
  Administered 2019-06-22: 20:00:00 40 meq via ORAL

## 2019-06-22 MED ORDER — SODIUM CHLORIDE 0.9 % IV BOLUS
250.0000 mL | Freq: Once | INTRAVENOUS | Status: AC
  Administered 2019-06-22: 250 mL via INTRAVENOUS

## 2019-06-22 MED ORDER — MORPHINE SULFATE (PF) 2 MG/ML IV SOLN
2.0000 mg | INTRAVENOUS | Status: DC | PRN
  Administered 2019-06-22 – 2019-06-23 (×2): 2 mg via INTRAVENOUS
  Filled 2019-06-22 (×2): qty 1

## 2019-06-22 MED ORDER — POTASSIUM CHLORIDE 10 MEQ/100ML IV SOLN
10.0000 meq | INTRAVENOUS | Status: AC
  Administered 2019-06-22 (×2): 10 meq via INTRAVENOUS
  Filled 2019-06-22 (×2): qty 100

## 2019-06-22 MED ORDER — ONDANSETRON HCL 4 MG/2ML IJ SOLN
4.0000 mg | Freq: Four times a day (QID) | INTRAMUSCULAR | Status: DC | PRN
  Administered 2019-06-24 – 2019-06-25 (×3): 4 mg via INTRAVENOUS
  Filled 2019-06-22 (×3): qty 2

## 2019-06-22 MED ORDER — METOPROLOL TARTRATE 5 MG/5ML IV SOLN
2.5000 mg | Freq: Once | INTRAVENOUS | Status: AC
  Administered 2019-06-22: 2.5 mg via INTRAVENOUS
  Filled 2019-06-22: qty 5

## 2019-06-22 MED ORDER — SODIUM CHLORIDE 0.9 % IV SOLN
2.0000 g | INTRAVENOUS | Status: DC
  Administered 2019-06-23 – 2019-06-24 (×2): 2 g via INTRAVENOUS
  Filled 2019-06-22 (×2): qty 2

## 2019-06-22 MED ORDER — METRONIDAZOLE IN NACL 5-0.79 MG/ML-% IV SOLN
500.0000 mg | Freq: Three times a day (TID) | INTRAVENOUS | Status: DC
  Administered 2019-06-22 – 2019-06-24 (×5): 500 mg via INTRAVENOUS
  Filled 2019-06-22 (×6): qty 100

## 2019-06-22 MED ORDER — TRAMADOL HCL 50 MG PO TABS: 50.0000 mg | ORAL_TABLET | Freq: Two times a day (BID) | ORAL | 0 refills | Status: DC

## 2019-06-22 MED ORDER — BACLOFEN 10 MG PO TABS: 10.0000 mg | ORAL_TABLET | Freq: Three times a day (TID) | ORAL | 0 refills | Status: DC

## 2019-06-22 MED ORDER — SODIUM CHLORIDE 0.9 % IV SOLN
Freq: Once | INTRAVENOUS | Status: AC
  Administered 2019-06-22: 21:00:00 via INTRAVENOUS

## 2019-06-22 MED ORDER — IOHEXOL 9 MG/ML PO SOLN
500.0000 mL | ORAL | Status: AC
  Administered 2019-06-22 (×2): 500 mL via ORAL

## 2019-06-22 MED ORDER — PANTOPRAZOLE SODIUM 40 MG PO TBEC
40.0000 mg | DELAYED_RELEASE_TABLET | Freq: Two times a day (BID) | ORAL | Status: DC
  Administered 2019-06-22 – 2019-06-28 (×11): 40 mg via ORAL
  Filled 2019-06-22 (×13): qty 1

## 2019-06-22 MED ORDER — SODIUM CHLORIDE 0.9 % IV BOLUS
500.0000 mL | Freq: Once | INTRAVENOUS | Status: AC
  Administered 2019-06-22: 20:00:00 500 mL via INTRAVENOUS

## 2019-06-22 MED ORDER — POTASSIUM CHLORIDE 10 MEQ/100ML IV SOLN
10.0000 meq | INTRAVENOUS | Status: AC
  Administered 2019-06-22: 10 meq via INTRAVENOUS
  Filled 2019-06-22 (×2): qty 100

## 2019-06-22 MED ORDER — HYDROMORPHONE HCL 1 MG/ML IJ SOLN
INTRAMUSCULAR | Status: AC
  Filled 2019-06-22: qty 1

## 2019-06-22 MED ORDER — POTASSIUM CHLORIDE CRYS ER 20 MEQ PO TBCR
40.0000 meq | EXTENDED_RELEASE_TABLET | Freq: Once | ORAL | Status: DC
  Filled 2019-06-22: qty 2

## 2019-06-22 MED ORDER — HYDROMORPHONE HCL 1 MG/ML IJ SOLN
0.5000 mg | Freq: Once | INTRAMUSCULAR | Status: AC
  Administered 2019-06-22: 19:00:00 0.5 mg via INTRAVENOUS

## 2019-06-22 MED ORDER — ENOXAPARIN SODIUM 40 MG/0.4ML ~~LOC~~ SOLN
40.0000 mg | SUBCUTANEOUS | Status: DC
  Administered 2019-06-22: 40 mg via SUBCUTANEOUS
  Filled 2019-06-22: qty 0.4

## 2019-06-22 MED ORDER — MAGNESIUM SULFATE 2 GM/50ML IV SOLN
2.0000 g | Freq: Once | INTRAVENOUS | Status: AC
  Administered 2019-06-22: 2 g via INTRAVENOUS
  Filled 2019-06-22: qty 50

## 2019-06-22 MED ORDER — SODIUM CHLORIDE 0.9 % IV SOLN
2.0000 g | Freq: Once | INTRAVENOUS | Status: AC
  Administered 2019-06-22: 18:00:00 2 g via INTRAVENOUS
  Filled 2019-06-22: qty 20

## 2019-06-22 NOTE — Progress Notes (Addendum)
Pt HR is at 138. Notify prime. Awaiting callback. Will continue to monitor.  Update 2153: Talked to [facility] and states will place order for normal saline 0.9% 250 bolus once and 2.5 mg IV metropolol once. Will continue to monitor.  Update 2305: Pt Hr stay at 130 and pain still at 7 after morphine administration. Notify Prime. Will continue to monitor.  Update 0016: Ouma NP ordered Percocet/Roxicet 5-325 mg and metropolol 5 mg IV every 4 hours PRN for HR >120. Will continue to monitor.  Update 0020: Pt went to CT. Will continue to monitor.   Update 0220: Pt HR still at 133 and BP 95/68 pt asymptomatic. Talked to [facility] and states to just monitor pt at this time. No new order was place. Will continue to monitor.  Update 0222: Lab came and talked to the nurse and states that the 2230 lactic was not drawn and states another lab staff will come draw pt lactic, but no one came to draw it. Nurse instructed the lab to go ahead and draw lactic now. Ouma NP made aware. Will continue to monitor.   Update 0225: Lab called and reported lactic at 2.3. Pt also have MEWS of 4. Ouma made aware and ordered to transfer pt to ICU. Will continue to monitor.  Update 0322: ICU was called and report given to Roann  Update [phone]: Pt was transferred to ICU. Husband [name] notified.

## 2019-06-22 NOTE — Progress Notes (Signed)
Name: Anita Reed   MRN: [id number]    DOB: 02-22-1961   Date:06/22/2019       Progress Note  Subjective  Chief Complaint  Chief Complaint  Patient presents with  . Hospitalization Follow-up    c-diff and colitis    HPI  Pt presents for Hospital follow up after being admitted on 06/01/2019 - 06/11/2019 for liver cirrhosis, portal HTN, GI bleeding (w/ NSAID use).  She was treated with PPI and octreotide for GI bleed.  Had EGD which did find Grade II and large esophageal varices.  Had colonoscopy - found pseudomembranous enterocolitis along with blood in the entire colon that was examined and non-bleeding external hemorrhoids.  She was also found to have SBP and C. Diff colitis during her stay.  Acute blood loss/GI bleed: Upon discharge was told to continue PO protonix (was on protonix IV while admitted), Hgb was stable after 3 units PRBC transfusion.   Portal HTN: secondary to liver cirrhosis/chronic ETOH use.  Had hypotension in hospital and was told to hold spironolactone, nadolol, and lasix. BP was stable upon discharge. BP today is stable and well below goal  Tachycardia: Improved with IV fluid and PRBC transfusion; HR is a bit elevated today.  SBP: Was on rocephin, then switched to Zosyn which did help improve her WBC's on repeat ascitic tap.  She was then placed on PO bactrim for 7 days.  Liver cirrhosis with ascites: Abdominal ultrasound: moderate ascites. S/p Paracentesis ( 10/5) yielding 2.4 liters of peritoneal fluid.Had 1700 WBCs in fluid, Repeat paracentesis 10/8 -with again higherleukocytes in the fluid.  Patient is improved now.  Advised to follow in GI clinic as outpatient.  Constipation: This is mostly resolved; having brown BM's.  She was unable to afford her Xifaxan.   Diarrhea-C. difficile colitis: As patient had findings of pseudomembranous colitis, and her diarrhea persisted and white blood cell count went up-GI also agrees to start treating for C. difficile  with oral vancomycin; she has since completed this course of antibiotics.  Tobacco abuse: Smoking about 1ppd or less a day; not ready to quit at this time.  Muscle Spasm and Abdominal Pain: She is taking tizanidine but it is not helping.  Oxycodone has been helping somewhat effective. Discussed that I will provide 1 week of hepatically dosed ultram (50mg  BID only), then she must obtain from GI provider if still needing ongoing.  Patient Active Problem List   Diagnosis Date Noted  . GIB (gastrointestinal bleeding) 06/01/2019  . GI bleeding 06/01/2019  . History of lymphoma 02/26/2019  . Typical atrial flutter ([place]) 02/07/2019  . Alcoholic cirrhosis of liver with ascites ([place]) 02/07/2019  . Persistent atrial fibrillation ([place]) 02/07/2019  . Anemia 02/07/2019  . Anorexia 02/07/2019  . GI bleed 01/28/2019  . Ascites 01/18/2019  . Cancer [facility])     Past Surgical History:  Procedure Laterality Date  . ABDOMINAL HYSTERECTOMY    . APPENDECTOMY    . COLONOSCOPY N/A 06/03/2019   Procedure: COLONOSCOPY;  Surgeon: Toledo, Benay Pike, MD;  Location: [facility] ENDOSCOPY;  Service: Gastroenterology;  Laterality: N/A;  . ESOPHAGOGASTRODUODENOSCOPY N/A 01/29/2019   Procedure: ESOPHAGOGASTRODUODENOSCOPY (EGD);  Surgeon: [name], MD;  Location: [facility] ENDOSCOPY;  Service: Gastroenterology;  Laterality: N/A;  . ESOPHAGOGASTRODUODENOSCOPY N/A 06/01/2019   Procedure: ESOPHAGOGASTRODUODENOSCOPY (EGD);  Surgeon: Toledo, Benay Pike, MD;  Location: [facility] ENDOSCOPY;  Service: Gastroenterology;  Laterality: N/A;  . TONSILLECTOMY      Family History  Problem Relation Age of Onset  .  Cancer Mother   . Cancer Father     Social History   Socioeconomic History  . Marital status: Married    Spouse name: [name]  . Number of children: 1  . Years of education: 74  . Highest education level: 9th grade  Occupational History  . Occupation: disabled  Social Needs  . Financial resource strain: Somewhat hard   . Food insecurity    Worry: Sometimes true    Inability: Sometimes true  . Transportation needs    Medical: No    Non-medical: No  Tobacco Use  . Smoking status: Current Every Day Smoker  . Smokeless tobacco: Never Used  Substance and Sexual Activity  . Alcohol use: Not Currently  . Drug use: Never  . Sexual activity: Not Currently  Lifestyle  . Physical activity    Days per week: 0 days    Minutes per session: 0 min  . Stress: Only a little  Relationships  . Social Herbalist on phone: Once a week    Gets together: Never    Attends religious service: Never    Active member of club or organization: No    Attends meetings of clubs or organizations: Never    Relationship status: Married  . Intimate partner violence    Fear of current or ex partner: No    Emotionally abused: No    Physically abused: No    Forced sexual activity: No  Other Topics Concern  . Not on file  Social History Narrative  . Not on file     Current Outpatient Medications:  .  furosemide (LASIX) 40 MG tablet, Take 40 mg by mouth 2 (two) times daily., Disp: , Rfl:  .  pantoprazole (PROTONIX) 40 MG tablet, Take 1 tablet (40 mg total) by mouth 2 (two) times daily., Disp: 60 tablet, Rfl: 11 .  tiZANidine (ZANAFLEX) 4 MG tablet, Take 0.5-1 tablets (2-4 mg total) by mouth every 8 (eight) hours as needed for muscle spasms., Disp: 270 tablet, Rfl: 0 .  oxyCODONE (OXY IR/ROXICODONE) 5 MG immediate release tablet, Take 1 tablet (5 mg total) by mouth every 6 (six) hours as needed for moderate pain. (Patient not taking: Reported on 06/22/2019), Disp: 15 tablet, Rfl: 0 .  rifaximin (XIFAXAN) 550 MG TABS tablet, Take 1 tablet (550 mg total) by mouth 2 (two) times daily. (Patient not taking: Reported on 06/22/2019), Disp: 42 tablet, Rfl: 0  No Known Allergies  I personally reviewed active problem list, medication list, allergies, notes from last encounter, lab results with the patient/caregiver today.    ROS Ten systems reviewed and is negative except as mentioned in HPI  Objective  Vitals:   06/22/19 1146  BP: 110/76  Pulse: (!) 102  Resp: 16  Temp: (!) 97.5 F (36.4 C)  TempSrc: Temporal  SpO2: 98%  Weight: 165 lb 3.2 oz (74.9 kg)  Height: 5\' 8"  (1.727 m)   Auscultated heart rate is approx 96  Body mass index is 25.12 kg/m.  Physical Exam  Constitutional: Patient appears well-developed and well-nourished. No distress.  HENT: Head: Normocephalic and atraumatic. Neck: Normal range of motion. Neck supple. No JVD present. No thyromegaly present.  Cardiovascular: Normal rate, regular rhythm and normal heart sounds.  No murmur heard. +3 pitting BLE edema extending to the knee. Pulmonary/Chest: Effort normal and breath sounds normal. No respiratory distress. Abdominal: Soft. Bowel sounds are normal, there is distension and tenderness that is generalized.  Musculoskeletal: Normal range of motion,  no joint effusions. No gross deformities Neurological: Pt is alert and oriented to person, place, and time. No cranial nerve deficit. Coordination, balance, strength, speech and gait are normal.  Skin: Skin is warm and dry. No rash noted. No erythema.  Psychiatric: Patient has a normal mood and affect. behavior is normal. Judgment and thought content normal.  No results found for this or any previous visit (from the past 72 hour(s)).   PHQ2/9: Depression screen [facility] 2/9 06/22/2019 04/02/2019 03/07/2019 02/26/2019  Decreased Interest 0 0 0 0  Down, Depressed, Hopeless 0 0 0 0  PHQ - 2 Score 0 0 0 0  Altered sleeping 0 0 - 0  Tired, decreased energy 0 0 - 0  Change in appetite 0 0 - 0  Feeling bad or failure about yourself  0 0 - 0  Trouble concentrating 0 0 - 0  Moving slowly or fidgety/restless 0 0 - 0  Suicidal thoughts 0 0 - 0  PHQ-9 Score 0 0 - 0  Difficult doing work/chores Not difficult at all Not difficult at all - Not difficult at all   PHQ-2/9 Result is negative.    Fall  Risk: Fall Risk  06/22/2019 04/02/2019 02/26/2019  Falls in the past year? 0 0 0  Number falls in past yr: 0 0 0  Injury with Fall? 0 0 0  Follow up Falls evaluation completed Falls evaluation completed -   Assessment & Plan  1. Alcoholic cirrhosis of liver with ascites ([place]) - Referral to new GI specialist - Dr. Alice Reichert - marked as urgent. - Ambulatory referral to Gastroenterology - COMPLETE METABOLIC PANEL WITH GFR  2. Gastrointestinal hemorrhage, unspecified gastrointestinal hemorrhage type - Ambulatory referral to Gastroenterology - CBC with Differential/Platelet - COMPLETE METABOLIC PANEL WITH GFR  3. Anemia, unspecified type - Ambulatory referral to Gastroenterology - CBC with Differential/Platelet  4. Alcohol abuse - COMPLETE METABOLIC PANEL WITH GFR  5. Muscle cramping - baclofen (LIORESAL) 10 MG tablet; Take 1 tablet (10 mg total) by mouth 3 (three) times daily. Muscle Relaxer  Dispense: 60 each; Refill: 0 - traMADol (ULTRAM) 50 MG tablet; Take 1 tablet (50 mg total) by mouth 2 (two) times daily for 7 days.  Dispense: 14 tablet; Refill: 0  6. Hypokalemia - CMP per orders  7. SBP (spontaneous bacterial peritonitis) ([place]) - Needs to see GI for follow up ASAP  8. C. difficile colitis - She completed vancomycin course. BM's have become more regular without constipation or diarrhea.  9. Portal hypertension ([place]) - Needs to see GI for follow up ASAP  10. Generalized abdominal pain - baclofen (LIORESAL) 10 MG tablet; Take 1 tablet (10 mg total) by mouth 3 (three) times daily. Muscle Relaxer  Dispense: 60 each; Refill: 0 - traMADol (ULTRAM) 50 MG tablet; Take 1 tablet (50 mg total) by mouth 2 (two) times daily for 7 days.  Dispense: 14 tablet; Refill: 0  Strongly recommended ER visit for this patient as her abdomen is quite distended, she is very uncomfortable (complaining of significant pain and difficulty with movement due to her swelling and ascites), and she has +3  pitting edema in BLE.  She adamantly refuses to go to ER for further evaluation.  We will obtain STAT labs at the medical mall to check of electrolyte abnormalities, leukocytosis and anemia.  She is transported by her husband.  Addendum @1547 : Labs returned and critical potassium level of 2.6 received at  1424; the patient was immediately contacted by our clinic and  istructed to go directly to the ER for further evaluation.  She was initially hesitant, but then agreeable (again declines EMS transport) - husband to transport.  It is noted that she did in fact arrive to the ER at 1521 today.

## 2019-06-22 NOTE — H&P (Addendum)
[facility] NAME: Anita Reed    MR#:  [id number]  DATE OF BIRTH:  [birth year]  DATE OF ADMISSION:  06/22/2019  PRIMARY CARE PHYSICIAN: Hubbard Hartshorn, FNP   REQUESTING/REFERRING PHYSICIAN: Nickolas Madrid  CHIEF COMPLAINT:  Abnormal labs  HISTORY OF PRESENT ILLNESS:  Anita Reed  is a 58 y.o. female with a known history of alcoholic liver cirrhosis, hypertension recent history of C. difficile colitis was admitted from 10-22 06/11/2019 for GI bleeding , C. difficile colitis and spontaneous bacterial peritonitis.  Patient was sent home with vancomycin oral for 10 days and Bactrim for 7 days and patient reports that he she has completed the antibiotic course of vancomycin today and Bactrim 3 days ago.  She was seen by her primary care physician and repeat lab work was done potassium was low at 2.6 and magnesium also was low at 1.5 and patient is sent over to the hospital regarding this.  Patient was found to be hypotensive and tachycardic with heart rate fluctuating at around 130s.  Patient is complaining of diffuse abdominal pain but denies any diarrhea nausea or vomiting.  She was seen by Dr. Marius Ditch in the past and prefers seeing Dr. Alice Reichert only regarding her gastroenterology consult.  Denies any chest pain.  Denies any sick contacts with Covid patients  PAST MEDICAL HISTORY:   Past Medical History:  Diagnosis Date  . Anemia   . Cancer ([place])   . Cirrhosis ([place])   . Hypertension     PAST SURGICAL HISTOIRY:   Past Surgical History:  Procedure Laterality Date  . ABDOMINAL HYSTERECTOMY    . APPENDECTOMY    . COLONOSCOPY N/A 06/03/2019   Procedure: COLONOSCOPY;  Surgeon: Toledo, Benay Pike, MD;  Location: [facility] ENDOSCOPY;  Service: Gastroenterology;  Laterality: N/A;  . ESOPHAGOGASTRODUODENOSCOPY N/A 01/29/2019   Procedure: ESOPHAGOGASTRODUODENOSCOPY (EGD);  Surgeon: [name], MD;  Location: [facility] ENDOSCOPY;  Service:  Gastroenterology;  Laterality: N/A;  . ESOPHAGOGASTRODUODENOSCOPY N/A 06/01/2019   Procedure: ESOPHAGOGASTRODUODENOSCOPY (EGD);  Surgeon: Toledo, Benay Pike, MD;  Location: [facility] ENDOSCOPY;  Service: Gastroenterology;  Laterality: N/A;  . TONSILLECTOMY      SOCIAL HISTORY:   Social History   Tobacco Use  . Smoking status: Current Every Day Smoker  . Smokeless tobacco: Never Used  Substance Use Topics  . Alcohol use: Not Currently    FAMILY HISTORY:   Family History  Problem Relation Age of Onset  . Cancer Mother   . Cancer Father     DRUG ALLERGIES:  No Known Allergies  REVIEW OF SYSTEMS:  CONSTITUTIONAL: No fever, fatigue or weakness.  EYES: No blurred or double vision.  EARS, NOSE, AND THROAT: No tinnitus or ear pain.  RESPIRATORY: No cough, shortness of breath, wheezing or hemoptysis.  CARDIOVASCULAR: No chest pain, orthopnea, edema.  GASTROINTESTINAL: No nausea, vomiting, diarrhea.  Reporting generalized abdominal pain GENITOURINARY: No dysuria, hematuria.  ENDOCRINE: No polyuria, nocturia,  HEMATOLOGY: No anemia, easy bruising or bleeding SKIN: No rash or lesion. MUSCULOSKELETAL: No joint pain or arthritis.   NEUROLOGIC: No tingling, numbness, weakness.  PSYCHIATRY: No anxiety or depression.   MEDICATIONS AT HOME:   Prior to Admission medications   Medication Sig Start Date End Date Taking? Authorizing Provider  furosemide (LASIX) 40 MG tablet Take 40 mg by mouth 2 (two) times daily.   Yes [provider]  pantoprazole (PROTONIX) 40 MG tablet Take 1 tablet (40 mg total) by mouth 2 (two) times  daily. 01/31/19 01/31/20 Yes Pyreddy, Reatha Harps, MD  traMADol (ULTRAM) 50 MG tablet Take 1 tablet (50 mg total) by mouth 2 (two) times daily for 7 days. 06/22/19 06/29/19 Yes Hubbard Hartshorn, FNP  baclofen (LIORESAL) 10 MG tablet Take 1 tablet (10 mg total) by mouth 3 (three) times daily. Muscle Relaxer 06/22/19   Hubbard Hartshorn, FNP      VITAL SIGNS:  Blood pressure  109/87, pulse (!) 128, temperature 98.6 F (37 C), temperature source Oral, resp. rate 15, height _0  (1.727 m), weight 74.8 kg, SpO2 98 %.  PHYSICAL EXAMINATION:  GENERAL:  58 y.o.-year-old patient lying in the bed with no acute distress.  EYES: Pupils equal, round, reactive to light and accommodation. No scleral icterus. Extraocular muscles intact.  HEENT: Head atraumatic, normocephalic. Oropharynx and nasopharynx clear.  NECK:  Supple, no jugular venous distention. No thyroid enlargement, no tenderness.  LUNGS: Normal breath sounds bilaterally, no wheezing, rales,rhonchi or crepitation. No use of accessory muscles of respiration.  CARDIOVASCULAR: S1, S2 normal. No murmurs, rubs, or gallops.  ABDOMEN: Soft, diffuse tenderness is present, somewhat distended. Bowel sounds present.  EXTREMITIES: No pedal edema, cyanosis, or clubbing.  NEUROLOGIC: Cranial nerves II through XII are intact. Muscle strength generalized weakness in all extremities. Sensation intact. Gait not checked.  PSYCHIATRIC: The patient is alert and oriented x 3.  SKIN: No obvious rash, lesion, or ulcer.   LABORATORY PANEL:   CBC Recent Labs  Lab 06/22/19 1600  WBC 12.3*  HGB 10.4*  HCT 33.2*  PLT 222   ------------------------------------------------------------------------------------------------------------------  Chemistries  Recent Labs  Lab 06/22/19 1600  NA 131*  K 2.5*  CL 93*  CO2 25  GLUCOSE 117*  BUN 8  CREATININE 0.62  CALCIUM 8.2*  MG 1.5*  AST 45*  ALT 19  ALKPHOS 438*  BILITOT 2.1*   ------------------------------------------------------------------------------------------------------------------  Cardiac Enzymes No results for input(s): TROPONINI in the last 168 hours. ------------------------------------------------------------------------------------------------------------------  RADIOLOGY:  No results found.  EKG:   Orders placed or performed in visit on 06/22/19  . EKG  12-Lead  . EKG 12-Lead    IMPRESSION AND PLAN:   #Sepsis-could be from spontaneous bacterial peritonitis Admit to [facility] unit Patient met septic criteria at the time of admission with hypotension, tachycardia Patient was just recently admitted and completed the course of antibiotics with Bactrim but still tender.  Patient has received IV Rocephin in the ED will continue the same for now.  Flagyl is added to the regimen  GI consult were placed and notified via epic chat,dr Vicente Males will see the pt Consult ID - notify in am Ultrasound-guided paracentesis is ordered for tomorrow with labs IV fluids Check lactic acid and procalcitonin We will get blood and urine cultures as well  #A. fib with RVR 500 cc IV fluid bolus was given Cardizem is not an option as patient is hypotensive at this time 1 dose of digoxin ordered Cardiology consult placed to Dr. Rockey Situ notified via epic chart  #Hypokalemia and hypomagnesemia Replete and recheck Pharmacy consult also placed regarding the same  #History of alcoholic liver cirrhosis Patient endorses history stopped drinking alcohol   DVT prophylaxis Lovenox subcu   All the records are reviewed and case discussed with ED provider. Management plans discussed with the patient, she is  in agreement.  CODE STATUS: DO NOT RESUSCITATE.  Husband is the healthcare power of attorney  TOTAL TIME TAKING CARE OF THIS PATIENT: 45 minutes.   Note: This dictation was prepared  with Dragon dictation along with smaller phrase technology. Any transcriptional errors that result from this process are unintentional.  Nicholes Mango M.D on 06/22/2019 at 7:21 PM  Between 7am to 6pm - Pager - [phone]  After 6pm go to [email or web address] - password [email or web address] [facility]  Office  [phone]  CC: Primary care physician; Hubbard Hartshorn, FNP

## 2019-06-22 NOTE — ED Provider Notes (Signed)
[facility] Emergency Department Provider Note  ____________________________________________   First MD Initiated Contact with Patient 06/22/19 1537     (approximate)  I have reviewed the triage vital signs and the nursing notes.   HISTORY  Chief Complaint Abnormal Lab    HPI Anita Reed is a 58 y.o. female who was recently admitted from 10/2 until 10/12 for liver cirrhosis, portal hypertension, GI bleeding with NSAID use.  She did have an EGD done that showed large esophageal varices.  Colonoscopy showed pseudomembranous enterocolitis and current currently getting treatment with C. difficile.  Her labs today noted she had a potassium of 2.6.  Hemoglobin was stable and white count was downtrending from recent admission to 13.3.  Patient says that she mostly she is having some abdominal tightness feeling that is moderate, constant, nothing makes it better, nothing makes it worse.  She denies any fevers.  She does not been drinking any alcohol.  She has completed her course for C. difficile.          Past Medical History:  Diagnosis Date  . Anemia   . Cancer ([place])   . Cirrhosis ([place])   . Hypertension     Patient Active Problem List   Diagnosis Date Noted  . Muscle cramping 06/22/2019  . Alcohol abuse 06/22/2019  . GIB (gastrointestinal bleeding) 06/01/2019  . GI bleeding 06/01/2019  . History of lymphoma 02/26/2019  . Typical atrial flutter ([place]) 02/07/2019  . Alcoholic cirrhosis of liver with ascites ([place]) 02/07/2019  . Persistent atrial fibrillation ([place]) 02/07/2019  . Anemia 02/07/2019  . Anorexia 02/07/2019  . GI bleed 01/28/2019  . Ascites 01/18/2019  . Cancer [facility])     Past Surgical History:  Procedure Laterality Date  . ABDOMINAL HYSTERECTOMY    . APPENDECTOMY    . COLONOSCOPY N/A 06/03/2019   Procedure: COLONOSCOPY;  Surgeon: Toledo, Benay Pike, MD;  Location: [facility] ENDOSCOPY;  Service: Gastroenterology;  Laterality: N/A;  .  ESOPHAGOGASTRODUODENOSCOPY N/A 01/29/2019   Procedure: ESOPHAGOGASTRODUODENOSCOPY (EGD);  Surgeon: [name], MD;  Location: [facility] ENDOSCOPY;  Service: Gastroenterology;  Laterality: N/A;  . ESOPHAGOGASTRODUODENOSCOPY N/A 06/01/2019   Procedure: ESOPHAGOGASTRODUODENOSCOPY (EGD);  Surgeon: Toledo, Benay Pike, MD;  Location: [facility] ENDOSCOPY;  Service: Gastroenterology;  Laterality: N/A;  . TONSILLECTOMY      Prior to Admission medications   Medication Sig Start Date End Date Taking? Authorizing Provider  baclofen (LIORESAL) 10 MG tablet Take 1 tablet (10 mg total) by mouth 3 (three) times daily. Muscle Relaxer 06/22/19   Hubbard Hartshorn, FNP  furosemide (LASIX) 40 MG tablet Take 40 mg by mouth 2 (two) times daily.    [provider]  pantoprazole (PROTONIX) 40 MG tablet Take 1 tablet (40 mg total) by mouth 2 (two) times daily. 01/31/19 01/31/20  Saundra Shelling, MD  traMADol (ULTRAM) 50 MG tablet Take 1 tablet (50 mg total) by mouth 2 (two) times daily for 7 days. 06/22/19 06/29/19  Hubbard Hartshorn, FNP    Allergies Patient has no known allergies.  Family History  Problem Relation Age of Onset  . Cancer Mother   . Cancer Father     Social History Social History   Tobacco Use  . Smoking status: Current Every Day Smoker  . Smokeless tobacco: Never Used  Substance Use Topics  . Alcohol use: Not Currently  . Drug use: Never      Review of Systems Constitutional: No fever/chills Eyes: No visual changes. ENT: No sore throat. Cardiovascular:  Denies chest pain.  Positive leg swelling Respiratory: Denies shortness of breath. Gastrointestinal: Positive abdominal distention no nausea, no vomiting.  No diarrhea.  No constipation. Genitourinary: Negative for dysuria. Musculoskeletal: Negative for back pain. Skin: Negative for rash. Neurological: Negative for headaches, focal weakness or numbness. All other ROS negative ____________________________________________   PHYSICAL  EXAM:  VITAL SIGNS: ED Triage Vitals  Enc Vitals Group     BP 06/22/19 1528 (!) 85/62     Pulse Rate 06/22/19 1528 (!) 128     Resp 06/22/19 1528 16     Temp 06/22/19 1528 98.6 F (37 C)     Temp Source 06/22/19 1528 Oral     SpO2 06/22/19 1528 99 %     Weight 06/22/19 1530 165 lb (74.8 kg)     Height 06/22/19 1530 5\' 8"  (1.727 m)     Head Circumference --      Peak Flow --      Pain Score 06/22/19 1530 9     Pain Loc --      Pain Edu? --      Excl. in [place]? --     Constitutional: Alert and oriented. Well appearing and in no acute distress. Eyes: Conjunctivae are normal. EOMI. Head: Atraumatic. Nose: No congestion/rhinnorhea. Mouth/Throat: Mucous membranes are moist.   Neck: No stridor. Trachea Midline. FROM Cardiovascular: Tachycardic, regular rhythm. Grossly normal heart sounds.  Good peripheral circulation. Respiratory: Normal respiratory effort.  No retractions. Lungs CTAB. Gastrointestinal: Distended abdomen but nontender. No distention. No abdominal bruits.  Musculoskeletal: No lower extremity tenderness nor edema.  No joint effusions. Neurologic:  Normal speech and language. No gross focal neurologic deficits are appreciated.  Skin:  Skin is warm, dry and intact. No rash noted. Psychiatric: Mood and affect are normal. Speech and behavior are normal. GU: Deferred   ____________________________________________   LABS (all labs ordered are listed, but only abnormal results are displayed)  Labs Reviewed  CBC WITH DIFFERENTIAL/PLATELET - Abnormal; Notable for the following components:      Result Value   WBC 12.3 (*)    Hemoglobin 10.4 (*)    HCT 33.2 (*)    MCV 79.0 (*)    MCH 24.8 (*)    RDW 19.1 (*)    Neutro Abs 9.5 (*)    All other components within normal limits  COMPREHENSIVE METABOLIC PANEL - Abnormal; Notable for the following components:   Sodium 131 (*)    Potassium 2.5 (*)    Chloride 93 (*)    Glucose, Bld 117 (*)    Calcium 8.2 (*)    Total  Protein 5.5 (*)    Albumin 2.9 (*)    AST 45 (*)    Alkaline Phosphatase 438 (*)    Total Bilirubin 2.1 (*)    All other components within normal limits  MAGNESIUM - Abnormal; Notable for the following components:   Magnesium 1.5 (*)    All other components within normal limits  URINALYSIS, ROUTINE W REFLEX MICROSCOPIC - Abnormal; Notable for the following components:   Color, Urine AMBER (*)    APPearance CLEAR (*)    All other components within normal limits  SARS CORONAVIRUS 2 BY RT PCR (HOSPITAL ORDER, [facility])  CULTURE, BLOOD (ROUTINE X 2)  CULTURE, BLOOD (ROUTINE X 2)  BODY FLUID CULTURE  PROTIME-INR  APTT  LACTIC ACID, PLASMA  LACTIC ACID, PLASMA  BODY FLUID CELL COUNT WITH DIFFERENTIAL  GLUCOSE, PLEURAL OR PERITONEAL FLUID  PROTEIN, PLEURAL OR PERITONEAL FLUID  LACTATE DEHYDROGENASE, PLEURAL OR PERITONEAL FLUID  ALBUMIN, PLEURAL OR PERITONEAL FLUID  AMYLASE, PLEURAL OR PERITONEAL FLUID    ____________________________________________   ED ECG REPORT I, Vanessa [place], the attending physician, personally viewed and interpreted this ECG.  Sinus tachycardia rate of 127, no st elevation, no twi, normal intervals.  ____________________________________________  RAD PROCEDURES  Procedure(s) performed (including Critical Care):  Ultrasound ED Peripheral IV (Provider)  Date/Time: 06/22/2019 4:15 PM Performed by: Vanessa [place], MD Authorized by: Vanessa Buhl, MD   Procedure details:    Indications: multiple failed IV attempts     Location:  Right AC   Angiocath:  20 G   Bedside Ultrasound Guided: Yes     Images: not archived     Patient tolerated procedure without complications: Yes     Dressing applied: Yes    ABDOMINAL PARACENTESIS  Date/Time: 06/22/2019 6:42 PM Performed by: Vanessa [place], MD Authorized by: Vanessa [place], MD  Consent: Verbal consent obtained. Consent given by: patient Patient understanding:  patient states understanding of the procedure being performed Patient consent: the patient's understanding of the procedure matches consent given Local anesthesia used: no  Anesthesia: Local anesthesia used: no  Sedation: Patient sedated: no  Patient tolerance: patient tolerated the procedure well with no immediate complications  .Critical Care Performed by: Vanessa [place], MD Authorized by: Vanessa , MD   Critical care provider statement:    Critical care time (minutes):  45   Critical care was necessary to treat or prevent imminent or life-threatening deterioration of the following conditions:  Sepsis   Critical care was time spent personally by me on the following activities:  Discussions with consultants, evaluation of patient's response to treatment, examination of patient, ordering and performing treatments and interventions, ordering and review of laboratory studies, ordering and review of radiographic studies, pulse oximetry, re-evaluation of patient's condition, obtaining history from patient or surrogate and review of old charts     ____________________________________________   INITIAL IMPRESSION / ASSESSMENT AND PLAN / ED COURSE  Anita Reed was evaluated in Emergency Department on 06/22/2019 for the symptoms described in the history of present illness. She was evaluated in the context of the global COVID-19 pandemic, which necessitated consideration that the patient might be at risk for infection with the SARS-CoV-2 virus that causes COVID-19. Institutional protocols and algorithms that pertain to the evaluation of patients at risk for COVID-19 are in a state of rapid change based on information released by regulatory bodies including the CDC and federal and state organizations. These policies and algorithms were followed during the patient's care in the ED.     Patient has a history of alcoholic cirrhosis who presents with decompensated cirrhosis with increasing  abdominal swelling and leg swelling and hypokalemia.  Patient denies any fevers or abdominal pain to suggest SBP.  She feels like it is just swollen that is causing her discomfort.  Will get a magnesium to evaluate for hypomagnesium.  Also get blood cultures and lactate given patient is tachycardic and has an elevated white count although I suspect that this is less likely secondary to bacteremia given patient is afebrile and otherwise well-appearing.  Patient has normal borderline low blood pressure so we will need to closely monitor.  Discussed with IR and they would not be able to come do a therapeutic paracentesis tonight.  We will do a diagnostic paracentesis and cover patient with ceftriaxone.  Patient does meet SIRS  criteria but holding off on fluid due to her cirrhosis and significant edema.  Discussed with the hospital team and they will admit patient for electrolyte abnormalities.      ____________________________________________   FINAL CLINICAL IMPRESSION(S) / ED DIAGNOSES   Final diagnoses:  Sepsis, due to unspecified organism, unspecified whether acute organ dysfunction present ([place])  Hypomagnesemia  Hypokalemia      MEDICATIONS GIVEN DURING THIS VISIT:  Medications  potassium chloride 10 mEq in 100 mL IVPB (0 mEq Intravenous Stopped 06/22/19 1810)  magnesium sulfate IVPB 2 g 50 mL (2 g Intravenous New Bag/Given 06/22/19 1824)  cefTRIAXone (ROCEPHIN) 2 g in sodium chloride 0.9 % 100 mL IVPB (2 g Intravenous New Bag/Given 06/22/19 1825)  potassium chloride SA (KLOR-CON) CR tablet 40 mEq (has no administration in time range)  HYDROmorphone (DILAUDID) injection 0.5 mg (0.5 mg Intravenous Given 06/22/19 1617)  HYDROmorphone (DILAUDID) injection 0.5 mg (0.5 mg Intravenous Given 06/22/19 1831)     ED Discharge Orders    None       Note:  This document was prepared using Dragon voice recognition software and may include unintentional dictation errors.   Vanessa [place], MD 06/22/19 [phone]

## 2019-06-22 NOTE — ED Notes (Addendum)
Date and time results received: 06/22/19   Test: Potassium Critical Value: 2.5  Name of Provider Notified: Dr Jari Pigg  See orders.

## 2019-06-22 NOTE — Progress Notes (Signed)
Family Meeting Note  Advance Directive:yes  Today a meeting took place with the Patient.    The following clinical team members were present during this meeting:MD  The following were discussed:Patient's diagnosis: Acute abdominal pain, abnormal labs with hypokalemia, hypomagnesemia, sepsis with hypotension tachycardia, atrial fibrillation with RVR will be admitted to the hospital.  The plan of care discussed in detail with the patient.  She verbalized understanding of the plan   patient's progosis: Unable to determine and Goals for treatment: DNR  Husband [name] is the healthcare power of attorney  Additional follow-up to be provided: Hospitalist, cardiology, gastroenterology, ID  Time spent during discussion:17 min  Nicholes Mango, MD

## 2019-06-22 NOTE — ED Notes (Signed)
Blood culture obtained from ultrasound IV.

## 2019-06-22 NOTE — ED Triage Notes (Signed)
Pt to ED via POV c/o low potassium. Pt has lab work done this morning and was called and told to come to ED. Pts K+ 2.6. In triage pts BP 85/62, HR 128. Pt states that she is having a lot pain and swelling in her abdomen and her legs. Pt is in NAD

## 2019-06-22 NOTE — Consult Note (Addendum)
PHARMACY CONSULT NOTE - FOLLOW UP  Pharmacy Consult for Electrolyte Monitoring and Replacement   Recent Labs: Potassium (mmol/L)  Date Value  06/22/2019 2.5 (LL)   Magnesium (mg/dL)  Date Value  06/22/2019 1.5 (L)   Calcium (mg/dL)  Date Value  06/22/2019 8.2 (L)   Albumin (g/dL)  Date Value  06/22/2019 2.9 (L)   Sodium (mmol/L)  Date Value  06/22/2019 131 (L)     Assessment: Pharmacy was consulted for electrolyte management.  KCL PO 40 mEq x1 dose and KCL IV 10 mEq x2 runsordered. Magnesium IV 2 x1 dose ordered  K 2.5 Mg 1.5  Goal of Therapy:  Electrolytes WNL  Plan:  - Will order KCL IV 10 mEq x2 runs and will follow up 1 hr after last infusion. Will order K level for 2300.   -Will order BMP/Mg/Phos with AM labs.   Rowland Lathe ,PharmD Clinical Pharmacist 06/22/2019 7:41 PM

## 2019-06-22 NOTE — Progress Notes (Signed)
CODE SEPSIS - PHARMACY COMMUNICATION  **[facility] Antibiotics should be administered within 1 hour of Sepsis diagnosis**  Time Code Sepsis Called/Page Received: @1756   Antibiotics Ordered: Ceftriaxone  Time of 1st antibiotic administration: Ceftriaxone 1825  Additional action taken by pharmacy: N/A  If necessary, Name of Provider/Nurse Contacted: N/A    Rowland Lathe ,PharmD Clinical Pharmacist  06/22/2019  6:06 PM

## 2019-06-23 ENCOUNTER — Inpatient Hospital Stay: Payer: Medicare Other

## 2019-06-23 ENCOUNTER — Encounter: Payer: Self-pay | Admitting: Radiology

## 2019-06-23 DIAGNOSIS — K7031 Alcoholic cirrhosis of liver with ascites: Principal | ICD-10-CM

## 2019-06-23 DIAGNOSIS — R109 Unspecified abdominal pain: Secondary | ICD-10-CM

## 2019-06-23 DIAGNOSIS — E876 Hypokalemia: Secondary | ICD-10-CM

## 2019-06-23 DIAGNOSIS — R188 Other ascites: Secondary | ICD-10-CM

## 2019-06-23 DIAGNOSIS — I4892 Unspecified atrial flutter: Secondary | ICD-10-CM

## 2019-06-23 DIAGNOSIS — D649 Anemia, unspecified: Secondary | ICD-10-CM

## 2019-06-23 LAB — CBC
HCT: 26.5 % — ABNORMAL LOW (ref 36.0–46.0)
Hemoglobin: 8.2 g/dL — ABNORMAL LOW (ref 12.0–15.0)
MCH: 24.5 pg — ABNORMAL LOW (ref 26.0–34.0)
MCHC: 30.9 g/dL (ref 30.0–36.0)
MCV: 79.1 fL — ABNORMAL LOW (ref 80.0–100.0)
Platelets: 177 10*3/uL (ref 150–400)
RBC: 3.35 MIL/uL — ABNORMAL LOW (ref 3.87–5.11)
RDW: 19 % — ABNORMAL HIGH (ref 11.5–15.5)
WBC: 10.7 10*3/uL — ABNORMAL HIGH (ref 4.0–10.5)
nRBC: 0 % (ref 0.0–0.2)

## 2019-06-23 LAB — COMPREHENSIVE METABOLIC PANEL
ALT: 17 U/L (ref 0–44)
AST: 36 U/L (ref 15–41)
Albumin: 2.7 g/dL — ABNORMAL LOW (ref 3.5–5.0)
Alkaline Phosphatase: 385 U/L — ABNORMAL HIGH (ref 38–126)
Anion gap: 11 (ref 5–15)
BUN: 6 mg/dL (ref 6–20)
CO2: 24 mmol/L (ref 22–32)
Calcium: 7.8 mg/dL — ABNORMAL LOW (ref 8.9–10.3)
Chloride: 97 mmol/L — ABNORMAL LOW (ref 98–111)
Creatinine, Ser: 0.56 mg/dL (ref 0.44–1.00)
GFR calc Af Amer: >60 mL/min (ref >60)
GFR calc non Af Amer: >60 mL/min (ref >60)
Glucose, Bld: 102 mg/dL — ABNORMAL HIGH (ref 70–99)
Potassium: 2.8 mmol/L — ABNORMAL LOW (ref 3.5–5.1)
Sodium: 132 mmol/L — ABNORMAL LOW (ref 135–145)
Total Bilirubin: 1.3 mg/dL — ABNORMAL HIGH (ref 0.3–1.2)
Total Protein: 4.9 g/dL — ABNORMAL LOW (ref 6.5–8.1)

## 2019-06-23 LAB — BODY FLUID CELL COUNT WITH DIFFERENTIAL
Lymphs, Fluid: 77 %
Monocyte-Macrophage-Serous Fluid: 7 %
Neutrophil Count, Fluid: 16 %
Total Nucleated Cell Count, Fluid: 100 cu mm

## 2019-06-23 LAB — GLUCOSE, CAPILLARY: Glucose-Capillary: 125 mg/dL — ABNORMAL HIGH (ref 70–99)

## 2019-06-23 LAB — POTASSIUM
Potassium: 2.8 mmol/L — ABNORMAL LOW (ref 3.5–5.1)
Potassium: 3.6 mmol/L (ref 3.5–5.1)
Potassium: 3.6 mmol/L (ref 3.5–5.1)

## 2019-06-23 LAB — ALBUMIN, PLEURAL OR PERITONEAL FLUID: Albumin, Fluid: <1 g/dL

## 2019-06-23 LAB — PROTEIN, PLEURAL OR PERITONEAL FLUID: Total protein, fluid: <3 g/dL

## 2019-06-23 LAB — LACTATE DEHYDROGENASE, PLEURAL OR PERITONEAL FLUID: LD, Fluid: 27 U/L — ABNORMAL HIGH (ref 3–23)

## 2019-06-23 LAB — PROTIME-INR
INR: 1.3 — ABNORMAL HIGH (ref 0.8–1.2)
Prothrombin Time: 16.4 seconds — ABNORMAL HIGH (ref 11.4–15.2)

## 2019-06-23 LAB — LACTIC ACID, PLASMA: Lactic Acid, Venous: 2.3 mmol/L (ref 0.5–1.9)

## 2019-06-23 LAB — PROCALCITONIN: Procalcitonin: 0.45 ng/mL

## 2019-06-23 LAB — GLUCOSE, PLEURAL OR PERITONEAL FLUID: Glucose, Fluid: 96 mg/dL

## 2019-06-23 LAB — MAGNESIUM
Magnesium: 1.7 mg/dL (ref 1.7–2.4)
Magnesium: 1.7 mg/dL (ref 1.7–2.4)

## 2019-06-23 LAB — TSH: TSH: 1.446 u[IU]/mL (ref 0.350–4.500)

## 2019-06-23 LAB — PHOSPHORUS
Phosphorus: 2.7 mg/dL (ref 2.5–4.6)
Phosphorus: 2 mg/dL — ABNORMAL LOW (ref 2.5–4.6)

## 2019-06-23 LAB — MRSA PCR SCREENING: MRSA by PCR: NEGATIVE

## 2019-06-23 IMAGING — DX DG CHEST 1V PORT
1 series · 1 of 1 positions shown · non-contrast
Comparison: Chest radiograph dated [DATE]

CLINICAL DATA: 57-year-old female with sepsis.

EXAM:
PORTABLE CHEST 1 VIEW

[chest ap]
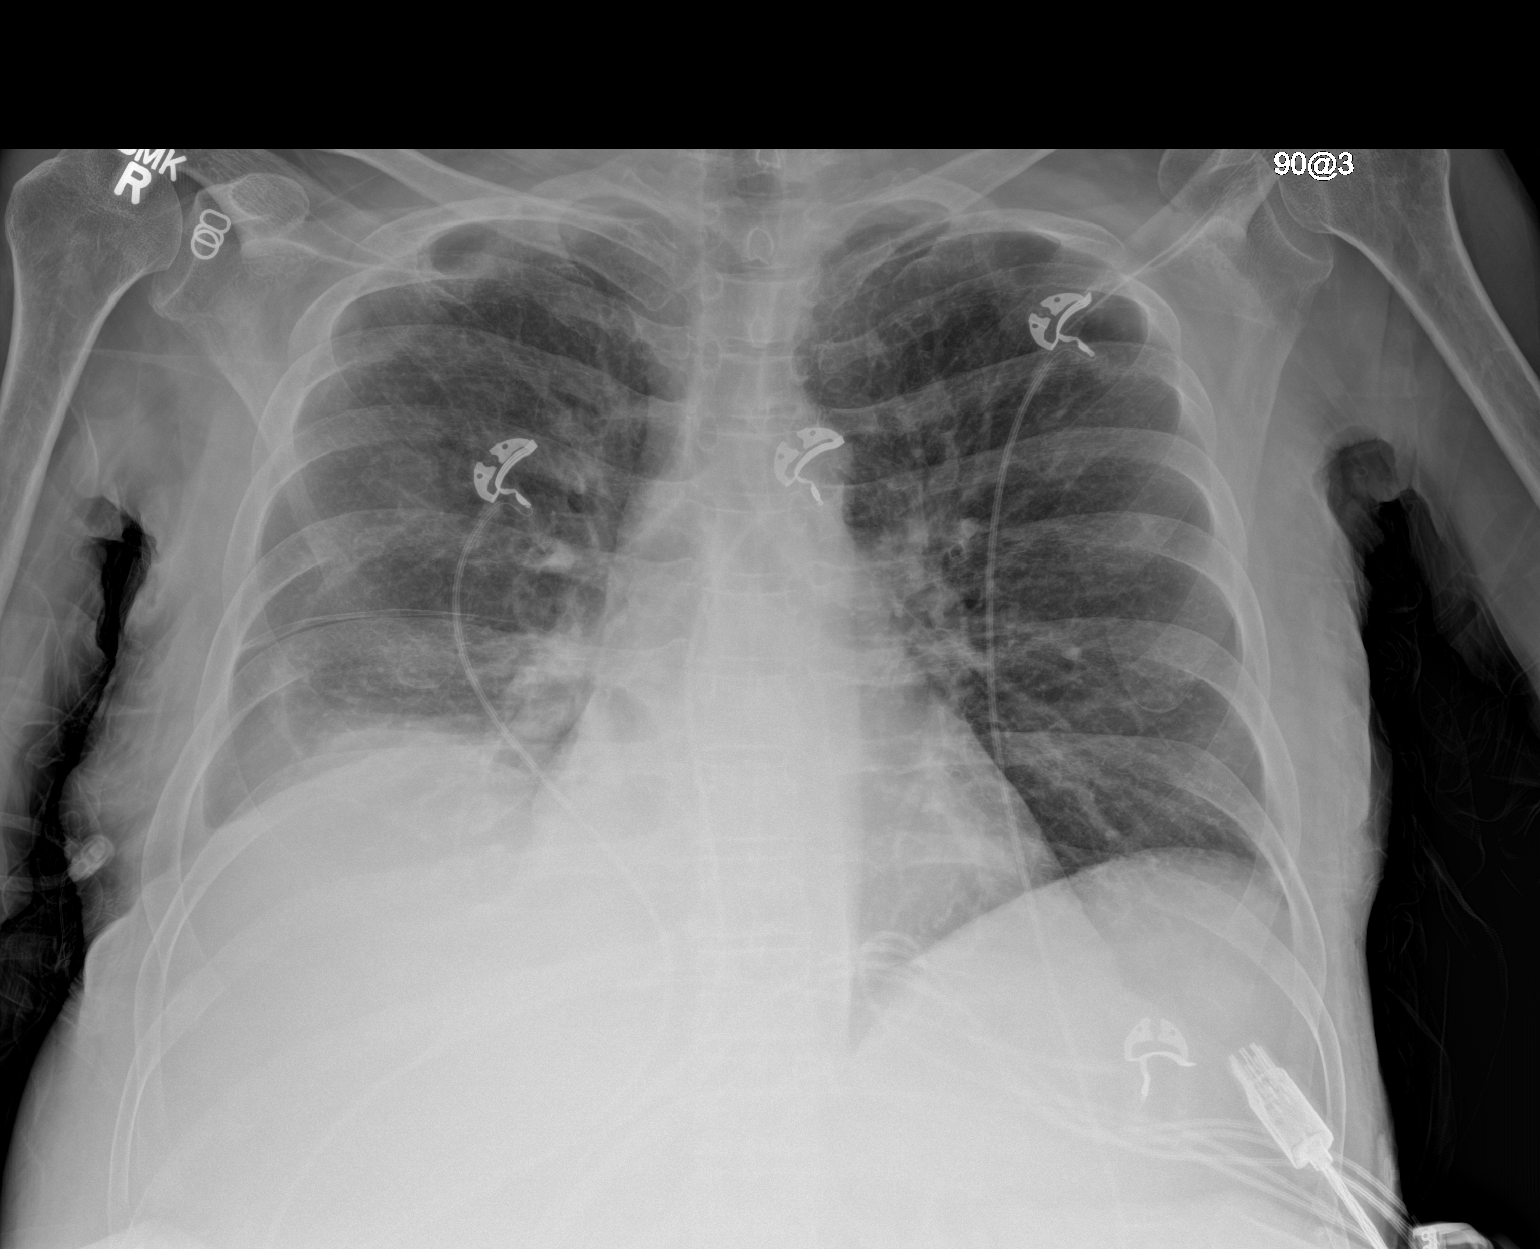

[1 of 1 positions shown; findings below may reference images not displayed]

FINDINGS: There is mild eventration of the right hemidiaphragm. There is a
small right pleural effusion with right lung base atelectasis or
infiltrate. An area of hazy density in the right apical subpleural
region appears similar to prior radiograph and may represent
scarring or developing infiltrate. Clinical correlation is
recommended. Or the left lung remains clear. No pneumothorax. Stable
cardiac silhouette. No acute osseous pathology.
IMPRESSION: 1. Small right pleural effusion with right lung base atelectasis or
infiltrate.
2. Stable area of hazy density in the right apical subpleural
region. Clinical correlation is recommended to exclude developing
infiltrate.

## 2019-06-23 IMAGING — CT CT ABD-PELV W/ CM
2 of 5 series · 16 of 46 positions shown, 18 images · IV contrast (APPLIED)
Comparison: CT of the abdomen pelvis dated [DATE]

CLINICAL DATA: 57-year-old female with abdominal pain.

EXAM:
CT ABDOMEN AND PELVIS WITH CONTRAST
TECHNIQUE: Multidetector CT imaging of the abdomen and pelvis was performed
using the standard protocol following bolus administration of
intravenous contrast.
CONTRAST:  100mL OMNIPAQUE IOHEXOL 300 MG/ML  SOLN

[Series 2: routine abd/pel with · axial · 0.96mm/px · z∈[-1048,-553]mm · 13 of 111 slices shown, 15 images]
[im 6/111  soft-tissue]
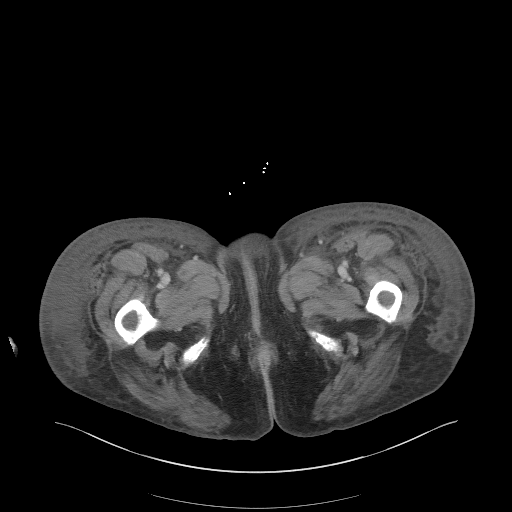
[im 6/111  bone]
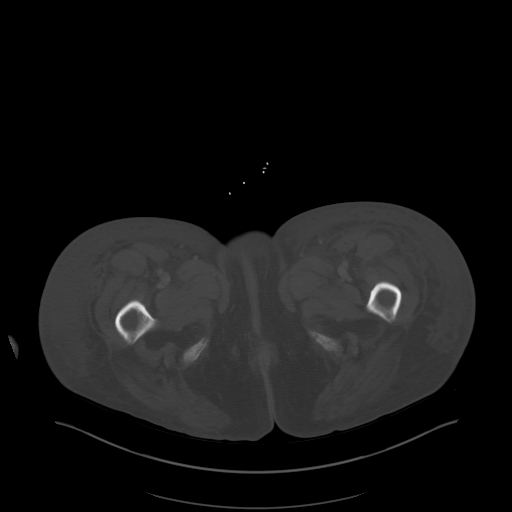
[im 18/111  soft-tissue]
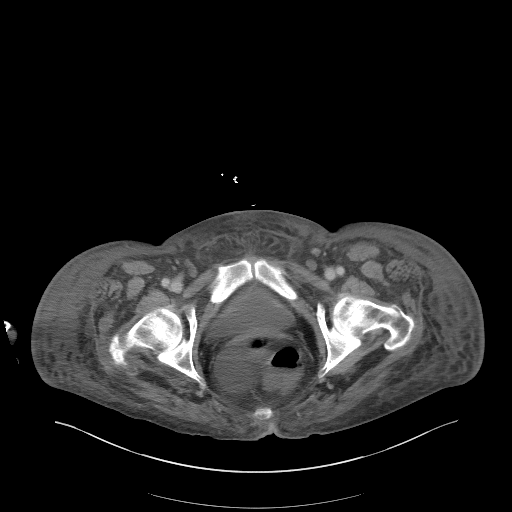
[im 24/111  soft-tissue]
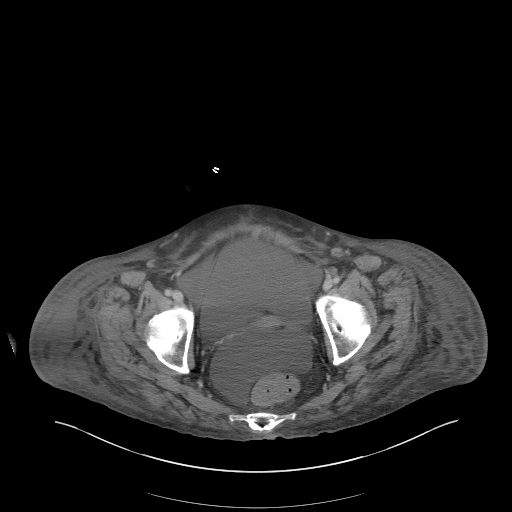
[im 29/111  soft-tissue]
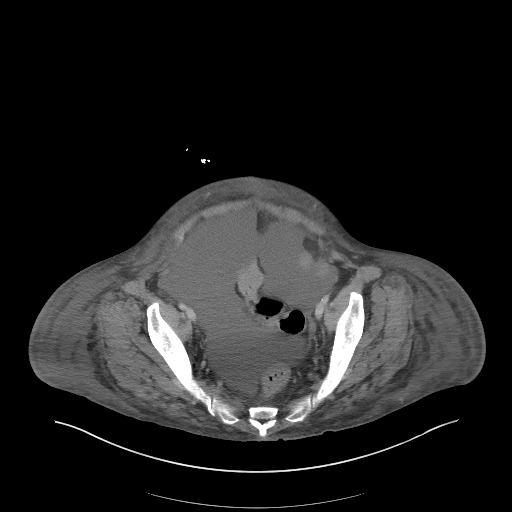
[im 41/111  soft-tissue]
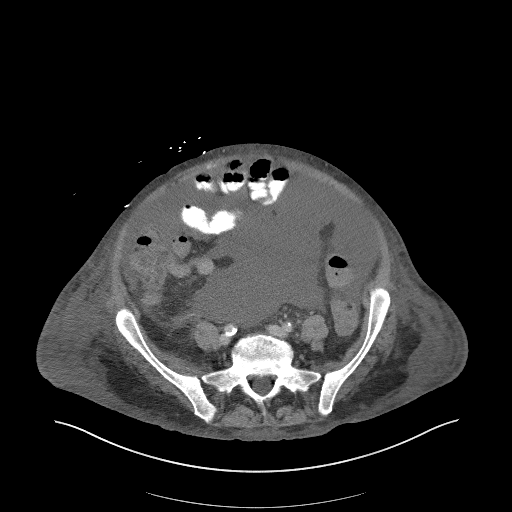
[im 47/111  soft-tissue]
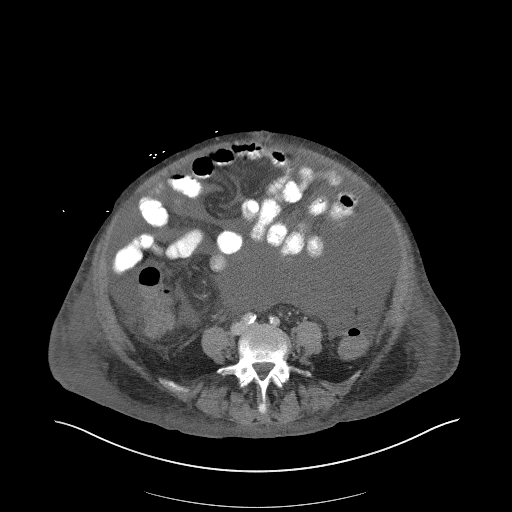
[im 58/111  soft-tissue]
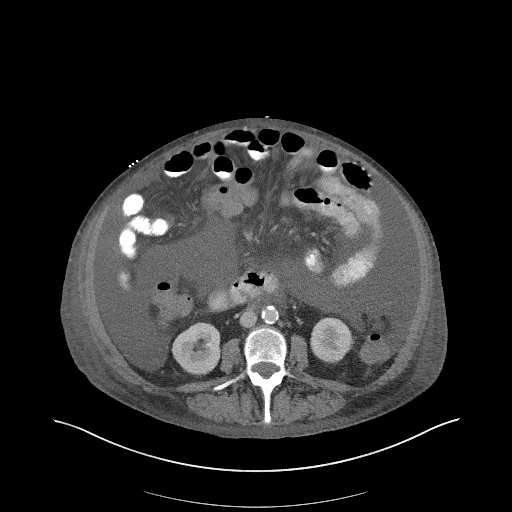
[im 64/111  soft-tissue]
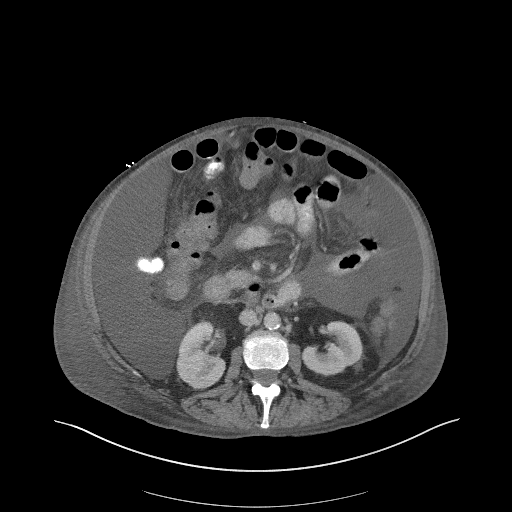
[im 70/111  soft-tissue]
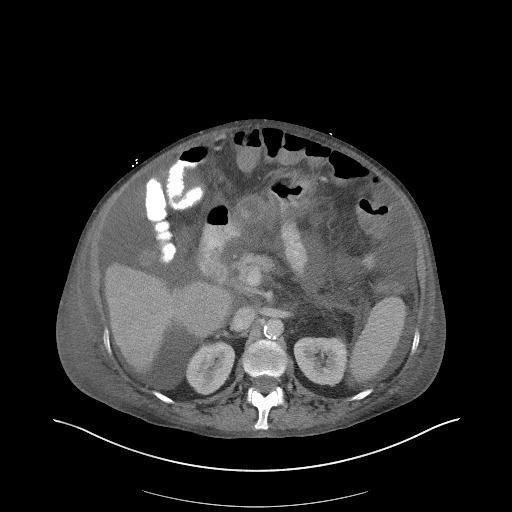
[im 70/111  bone]
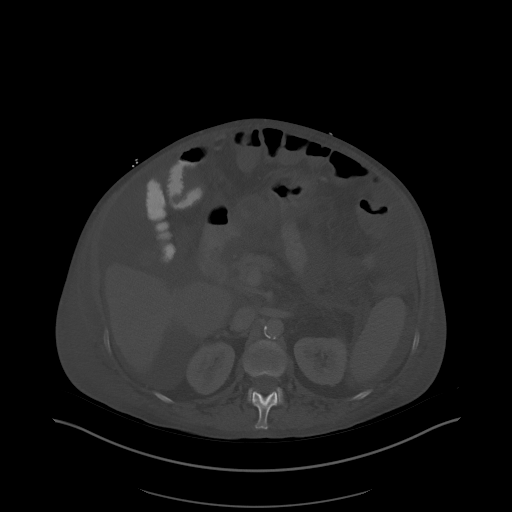
[im 82/111  soft-tissue]
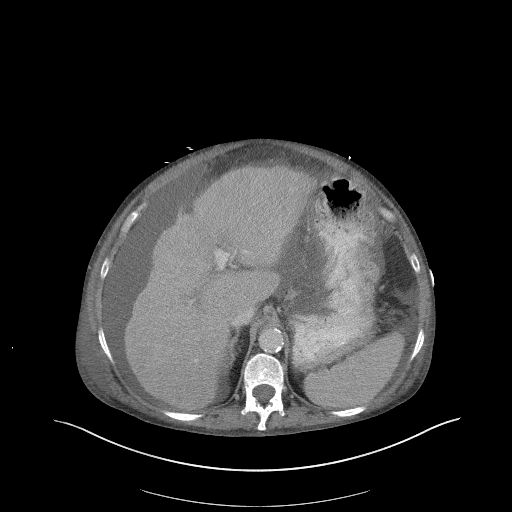
[im 87/111  soft-tissue]
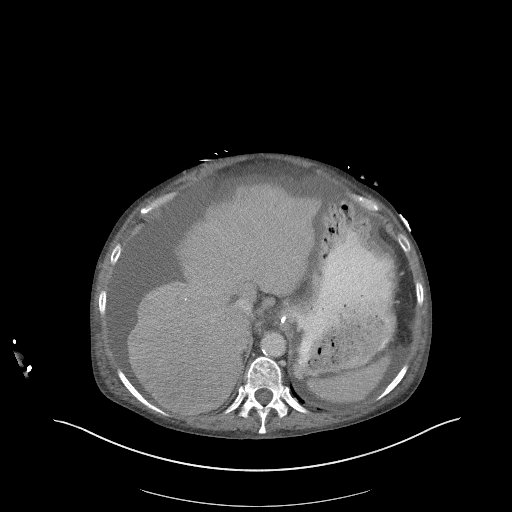
[im 93/111  soft-tissue]
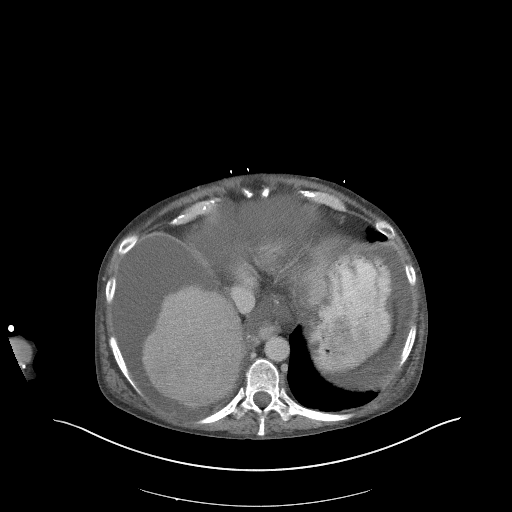
[im 105/111  soft-tissue]
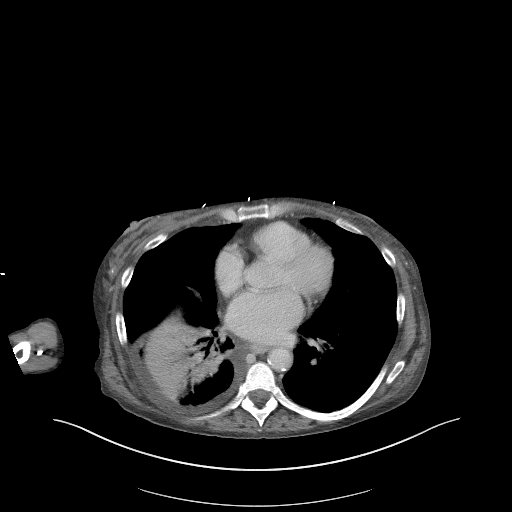

[Series 5: coronal st · coronal · 0.87mm/px · 3 of 103 slices shown]
[im 35/103  soft-tissue]
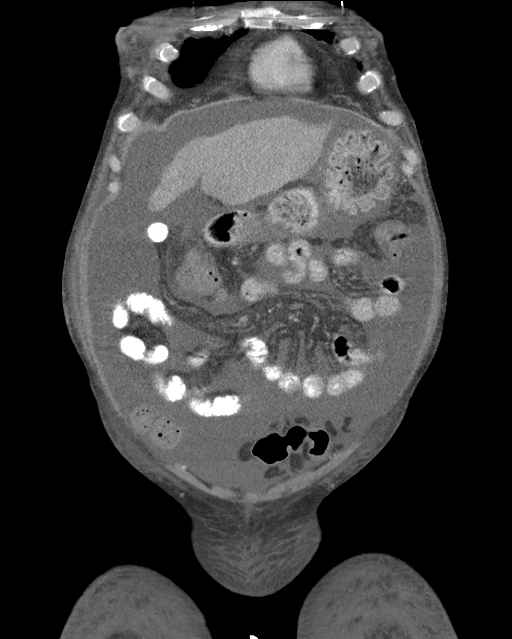
[im 46/103  soft-tissue]
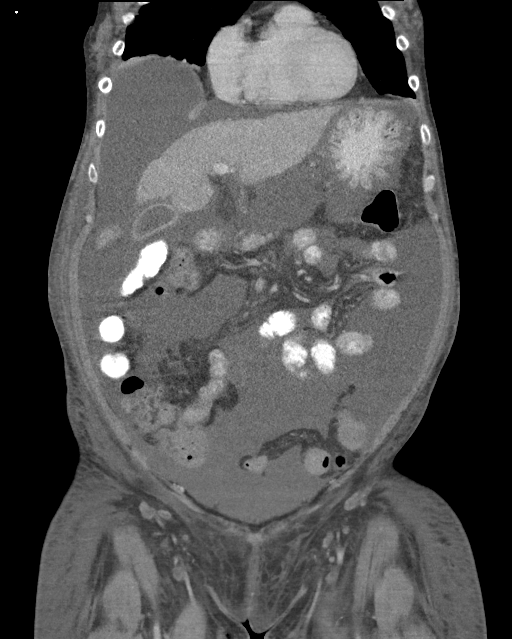
[im 57/103  soft-tissue]
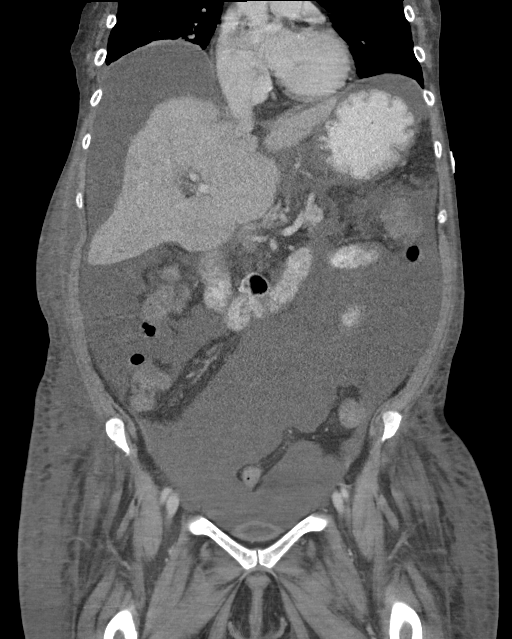

[16 of 46 positions shown; findings below may reference images not displayed]

FINDINGS: Lower chest: There is a small right pleural effusion with right lung
base atelectasis or infiltrate. Clinical correlation is recommended.
There is eventration of the right hemidiaphragm similar to prior CT.
Coronary vascular calcifications noted.

No intra-abdominal free air. Moderate ascites, increased since the
prior CT.

Hepatobiliary: Cirrhosis. No intrahepatic biliary ductal dilatation.
No calcified gallstone.

Pancreas: Unremarkable. No pancreatic ductal dilatation or
surrounding inflammatory changes.

Spleen: Normal in size without focal abnormality.

Adrenals/Urinary Tract: The adrenal glands are unremarkable. There
is no hydronephrosis on either side. There is symmetric enhancement
and excretion of contrast by both kidneys. The urinary bladder is
unremarkable.

Stomach/Bowel: There is loose stool throughout the colon. There is a
small hiatal hernia. No bowel obstruction. Appendectomy.

Vascular/Lymphatic: Advanced aortoiliac atherosclerotic disease. The
IVC is unremarkable. The SMV, splenic vein, and main portal vein are
patent. No portal venous gas. Paraesophageal and gastric varices
noted. There is recanalization of the paraumbilical vein.

Reproductive: Hysterectomy.

Other: Diffuse subcutaneous edema and anasarca, increased since the
prior CT.

Musculoskeletal: No acute or significant osseous findings.
IMPRESSION: 1. Small right pleural effusion with right lung base atelectasis or
infiltrate. Clinical correlation is recommended.
2. Decompensated cirrhosis.
3. Moderate ascites, increased since the prior CT.
4. No bowel obstruction.

Aortic Atherosclerosis ([AJ]-[AJ]).

## 2019-06-23 IMAGING — US US EXTREM LOW VENOUS
1 series · 13 of 24 positions shown · non-contrast
Comparison: None.

CLINICAL DATA: Bilateral lower extremity edema



[Series 1: us extrem low venous · 13 of 60 slices shown]
[im 1/60]
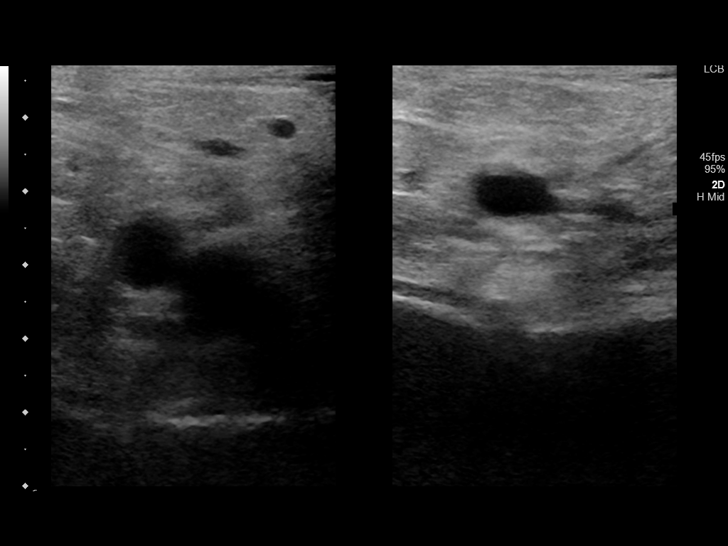
[im 6/60]
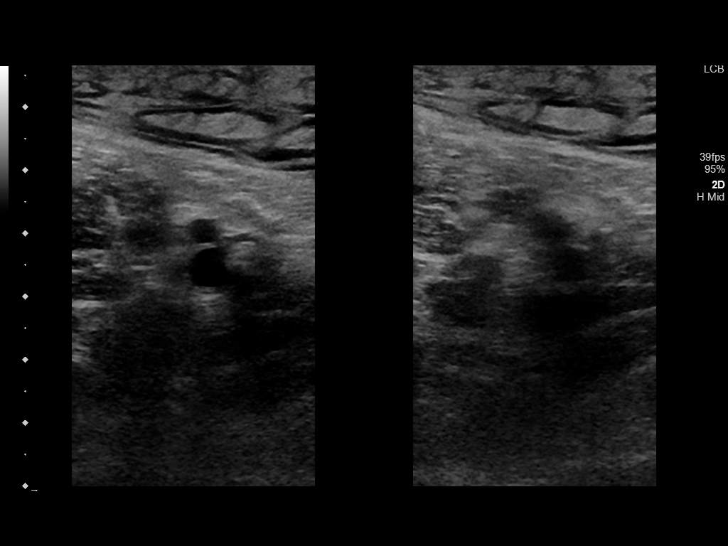
[im 11/60]
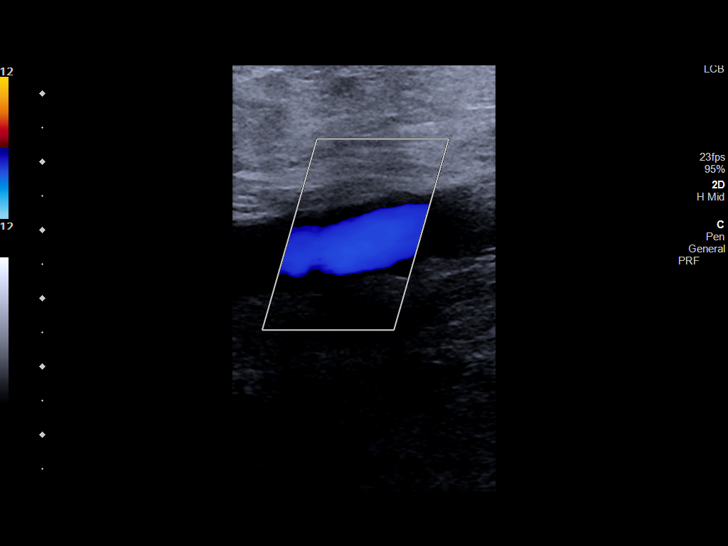
[im 16/60]
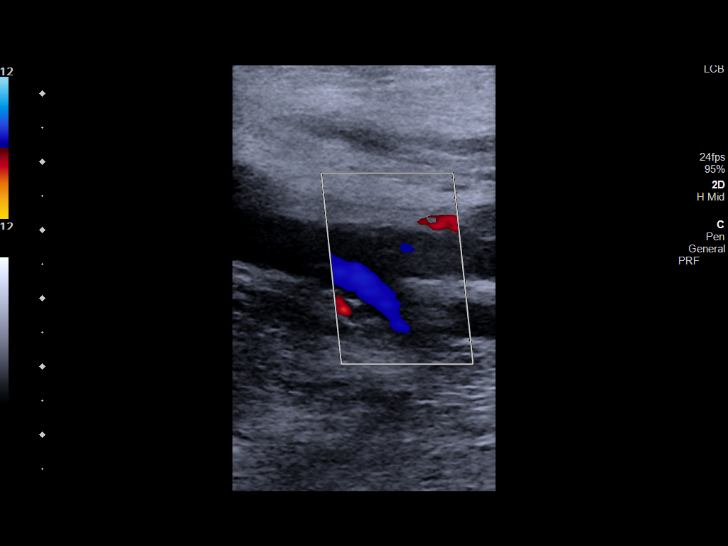
[im 21/60]
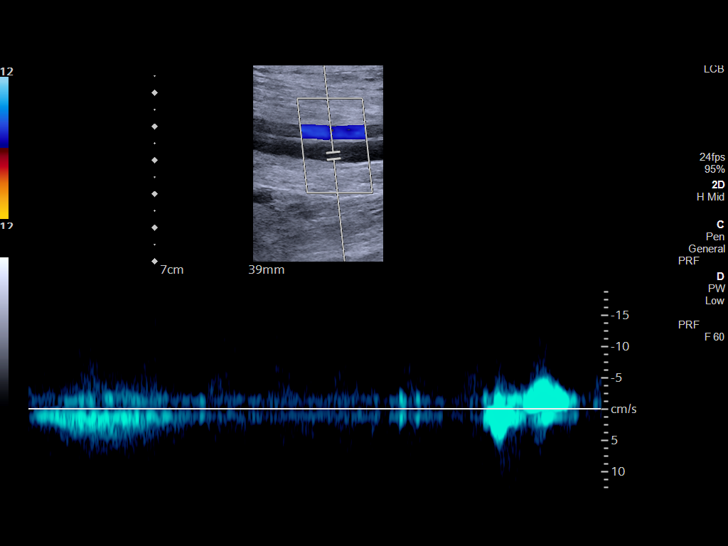
[im 26/60]
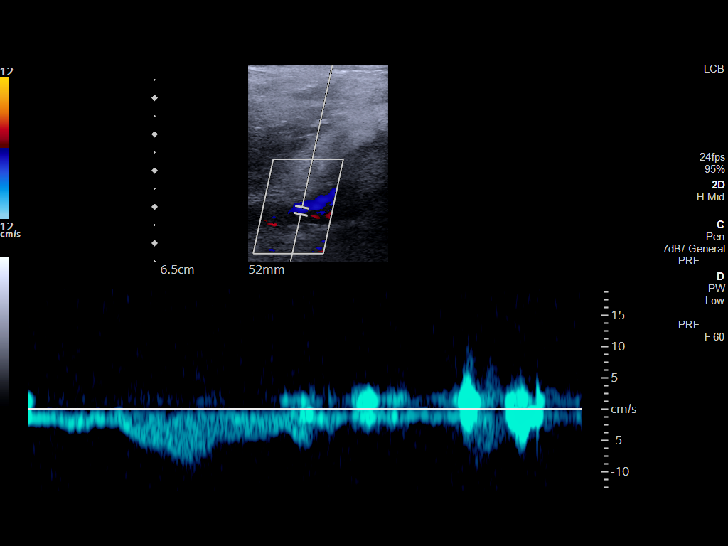
[im 31/60]
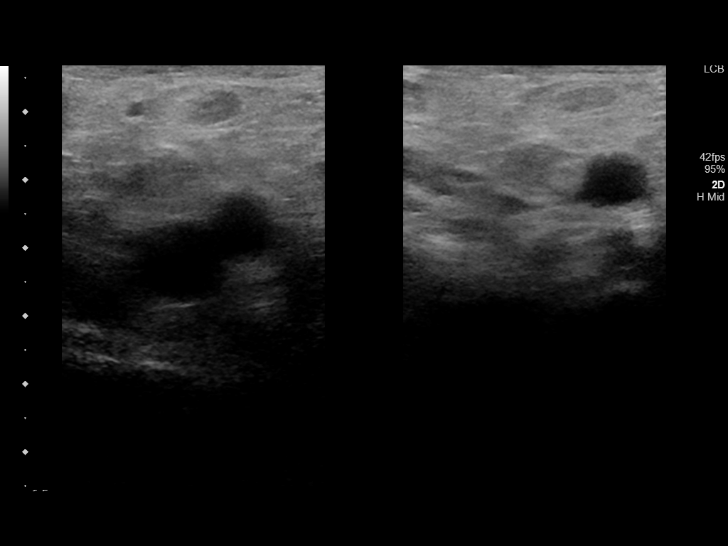
[im 34/60]
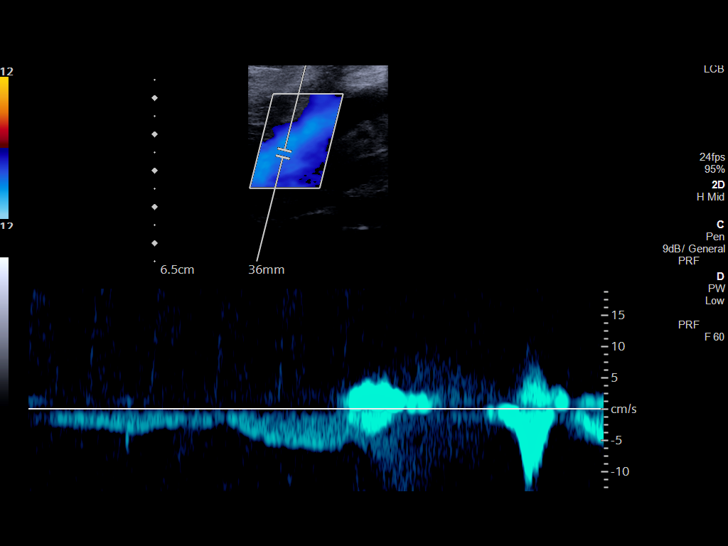
[im 39/60]
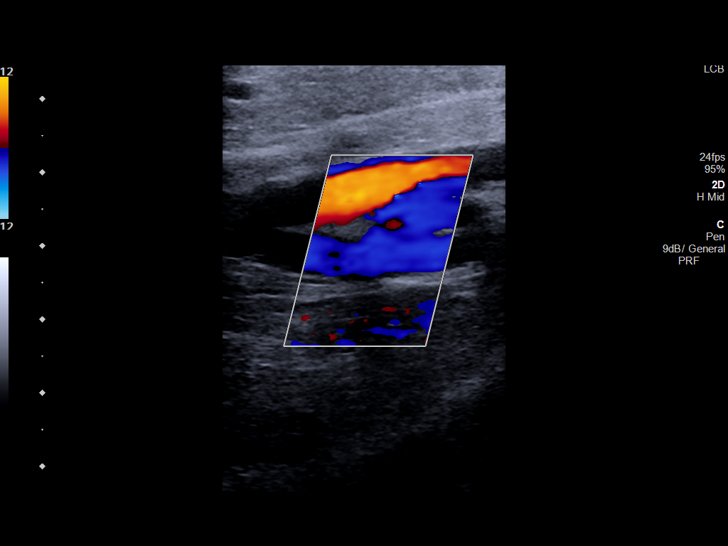
[im 44/60]
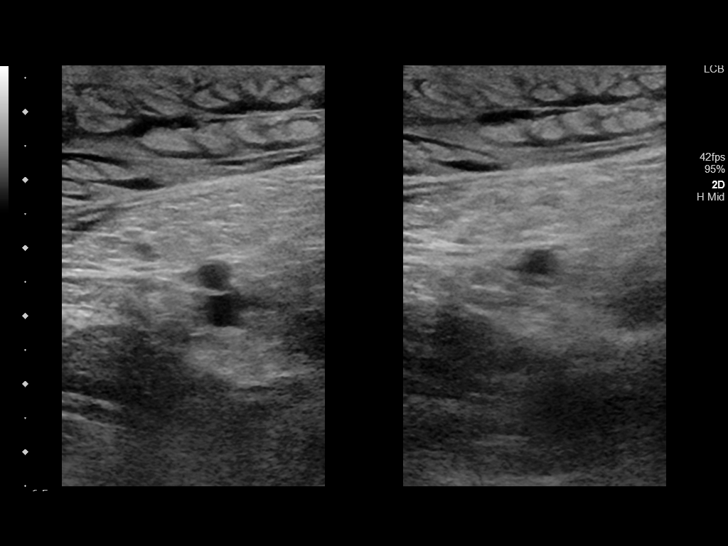
[im 49/60]
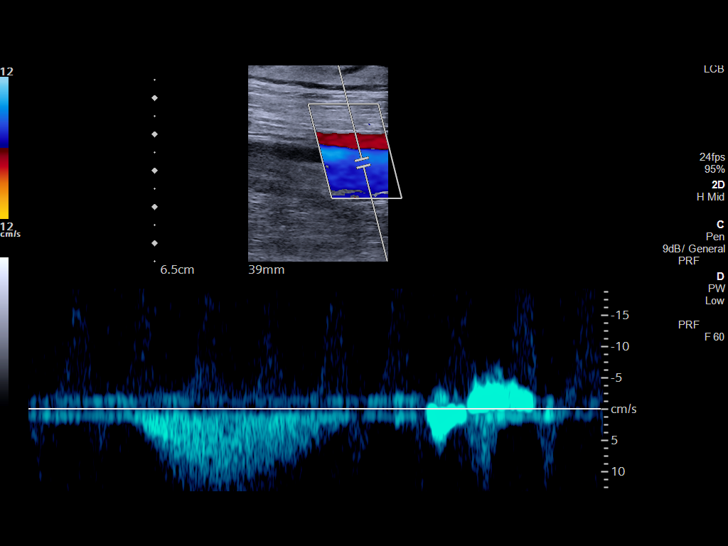
[im 54/60]
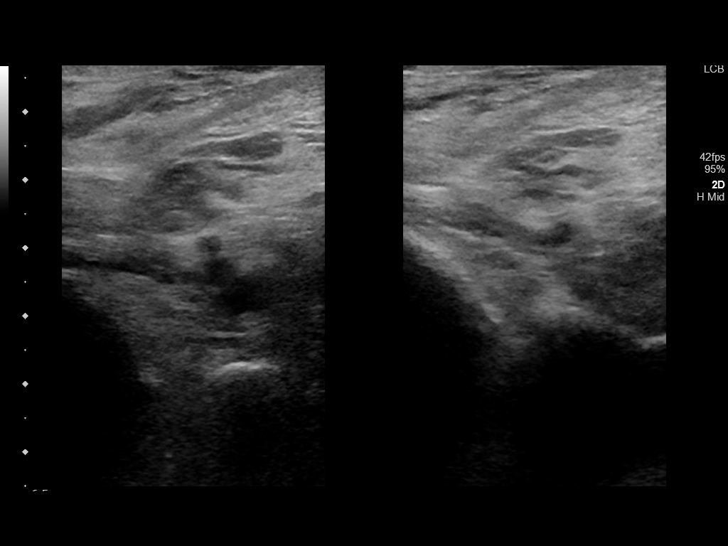
[im 60/60]
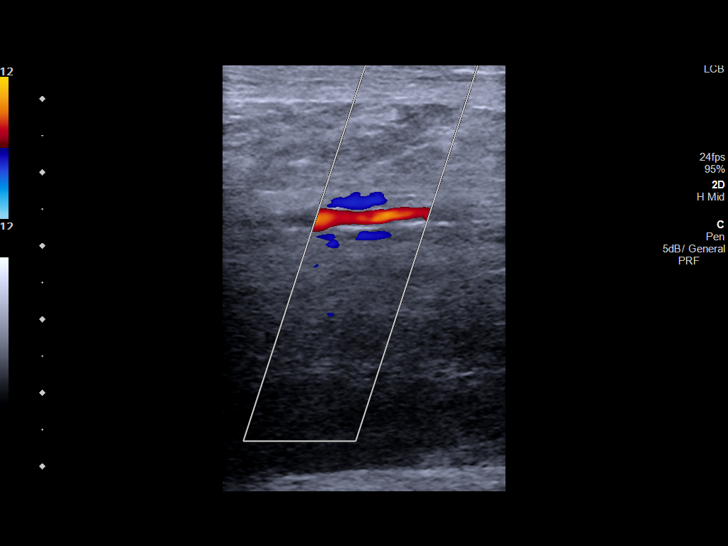

[13 of 24 positions shown; findings below may reference images not displayed]

FINDINGS: RIGHT LOWER EXTREMITY

Common Femoral Vein: No evidence of thrombus. Normal
compressibility, respiratory phasicity and response to augmentation.

Saphenofemoral Junction: No evidence of thrombus. Normal
compressibility and flow on color Doppler imaging.

Profunda Femoral Vein: No evidence of thrombus. Normal
compressibility and flow on color Doppler imaging.

Femoral Vein: No evidence of thrombus. Normal compressibility,
respiratory phasicity and response to augmentation.

Popliteal Vein: No evidence of thrombus. Normal compressibility,
respiratory phasicity and response to augmentation.

Calf Veins: No evidence of thrombus. Normal compressibility and flow
on color Doppler imaging.

Other Findings:  Peripheral subcutaneous edema noted

LEFT LOWER EXTREMITY

Common Femoral Vein: No evidence of thrombus. Normal
compressibility, respiratory phasicity and response to augmentation.

Saphenofemoral Junction: No evidence of thrombus. Normal
compressibility and flow on color Doppler imaging.

Profunda Femoral Vein: No evidence of thrombus. Normal
compressibility and flow on color Doppler imaging.

Femoral Vein: No evidence of thrombus. Normal compressibility,
respiratory phasicity and response to augmentation.

Popliteal Vein: No evidence of thrombus. Normal compressibility,
respiratory phasicity and response to augmentation.

Calf Veins: No evidence of thrombus. Normal compressibility and flow
on color Doppler imaging.

Other Findings:  Peripheral edema noted
IMPRESSION: No evidence of deep venous thrombosis in either lower extremity.

## 2019-06-23 MED ORDER — LACTATED RINGERS IV BOLUS
1000.0000 mL | Freq: Once | INTRAVENOUS | Status: AC
  Administered 2019-06-23: 10:00:00 1000 mL via INTRAVENOUS

## 2019-06-23 MED ORDER — POTASSIUM CHLORIDE IN NACL 40-0.9 MEQ/L-% IV SOLN
INTRAVENOUS | Status: DC
  Filled 2019-06-23 (×2): qty 1000

## 2019-06-23 MED ORDER — IOHEXOL 300 MG/ML  SOLN
100.0000 mL | Freq: Once | INTRAMUSCULAR | Status: AC | PRN
  Administered 2019-06-23: 100 mL via INTRAVENOUS

## 2019-06-23 MED ORDER — POTASSIUM CHLORIDE CRYS ER 20 MEQ PO TBCR
40.0000 meq | EXTENDED_RELEASE_TABLET | Freq: Two times a day (BID) | ORAL | Status: AC
  Administered 2019-06-23 – 2019-06-24 (×3): 40 meq via ORAL
  Filled 2019-06-23 (×3): qty 2

## 2019-06-23 MED ORDER — ALBUMIN HUMAN 25 % IV SOLN
12.5000 g | Freq: Once | INTRAVENOUS | Status: AC
  Administered 2019-06-23: 12.5 g via INTRAVENOUS
  Filled 2019-06-23: qty 50

## 2019-06-23 MED ORDER — HYDROMORPHONE HCL 1 MG/ML IJ SOLN
0.5000 mg | Freq: Once | INTRAMUSCULAR | Status: AC
  Administered 2019-06-23: 04:00:00 0.5 mg via INTRAVENOUS

## 2019-06-23 MED ORDER — OXYCODONE-ACETAMINOPHEN 5-325 MG PO TABS
1.0000 | ORAL_TABLET | ORAL | Status: DC | PRN
  Administered 2019-06-23: 1 via ORAL
  Filled 2019-06-23: qty 1

## 2019-06-23 MED ORDER — CHLORHEXIDINE GLUCONATE CLOTH 2 % EX PADS
6.0000 | MEDICATED_PAD | Freq: Every day | CUTANEOUS | Status: DC
  Administered 2019-06-23 – 2019-06-28 (×5): 6 via TOPICAL

## 2019-06-23 MED ORDER — DIGOXIN 250 MCG PO TABS: 0.2500 mg | ORAL_TABLET | Freq: Four times a day (QID) | ORAL | Status: DC

## 2019-06-23 MED ORDER — METOPROLOL TARTRATE 5 MG/5ML IV SOLN: 2.5000 mg | INTRAVENOUS | Status: DC | PRN

## 2019-06-23 MED ORDER — HYDROMORPHONE HCL 1 MG/ML IJ SOLN
INTRAMUSCULAR | Status: AC
  Administered 2019-06-23: 0.5 mg via INTRAVENOUS
  Filled 2019-06-23: qty 1

## 2019-06-23 MED ORDER — BACLOFEN 10 MG PO TABS
10.0000 mg | ORAL_TABLET | Freq: Two times a day (BID) | ORAL | Status: DC | PRN
  Administered 2019-06-23 – 2019-06-28 (×10): 10 mg via ORAL
  Filled 2019-06-23 (×14): qty 1

## 2019-06-23 MED ORDER — METOPROLOL TARTRATE 5 MG/5ML IV SOLN
5.0000 mg | INTRAVENOUS | Status: DC | PRN
  Filled 2019-06-23: qty 5

## 2019-06-23 MED ORDER — DIGOXIN 250 MCG PO TABS
0.2500 mg | ORAL_TABLET | Freq: Four times a day (QID) | ORAL | Status: AC
  Administered 2019-06-23 (×3): 0.25 mg via ORAL
  Filled 2019-06-23 (×3): qty 1

## 2019-06-23 MED ORDER — POTASSIUM CHLORIDE 10 MEQ/100ML IV SOLN
10.0000 meq | INTRAVENOUS | Status: AC
  Administered 2019-06-23 (×5): 10 meq via INTRAVENOUS
  Filled 2019-06-23 (×6): qty 100

## 2019-06-23 MED ORDER — HYDROMORPHONE HCL 1 MG/ML IJ SOLN
0.5000 mg | INTRAMUSCULAR | Status: DC | PRN
  Administered 2019-06-23 – 2019-06-28 (×23): 0.5 mg via INTRAVENOUS
  Filled 2019-06-23 (×24): qty 1

## 2019-06-23 NOTE — Consult Note (Signed)
PHARMACY CONSULT NOTE - FOLLOW UP  Pharmacy Consult for Electrolyte Monitoring and Replacement   Recent Labs: Potassium (mmol/L)  Date Value  06/23/2019 3.6   Magnesium (mg/dL)  Date Value  06/23/2019 1.7   Calcium (mg/dL)  Date Value  06/23/2019 7.8 (L)   Albumin (g/dL)  Date Value  06/23/2019 2.7 (L)   Phosphorus (mg/dL)  Date Value  06/23/2019 2.7   Sodium (mmol/L)  Date Value  06/23/2019 132 (L)     Assessment: Pharmacy was consulted for electrolyte management.  KCL PO 40 mEq x1 dose and KCL IV 10 mEq x2 runsordered. Magnesium IV 2 x1 dose ordered  K 3.6  Goal of Therapy:  Electrolytes WNL  Plan:  No additional replacement at this time. Will recheck w/ am labs.  Rowland Lathe ,PharmD Clinical Pharmacist 06/23/2019 7:56 PM

## 2019-06-23 NOTE — Procedures (Signed)
    Paracentesis Procedure Note   Paracentesis Procedure Note    INDICATION: Massive ascites r/o SBP  PROCEDURE OPERATOR: Draeden Kellman  Ultrasound used to mark location: Yes    CONSENT: by patient in EMR  Consent was obtained from patient prior to the procedure. Indications, risks, and benefits were explained at length.       PROCEDURE SUMMARY:   A time-out was performed. My hands were washed immediately prior to the procedure. I wore a surgical cap, mask with protective eyewear, sterile gown and sterile gloves throughout the procedure. The area was cleansed and draped in usual sterile fashion using chlorhexidine scrub. Anesthesia was achieved with 1% lidocaine. The suprapubic medial aspect of the abdomen was prepped and draped in a sterile fashion using chlorhexidine scrub. 1% lidocaine was used to numb the skin, soft tissue and peritoneum. The paracentesis catheter was inserted and advanced with negative pressure until straw colored fluid was aspirated. Approximately 60 mL of ascitic fluid was collected and sent for laboratory analysis. The catheter was then connected to the vaccutainer and 3 liters of additional ascitic fluid were drained. The catheter was removed and no leaking was noted. A bandaid was placed over the puncture wound. The patient tolerated the procedure well without any immediate complications. Estimated blood loss was 0.      Ottie Glazier, M.D.  [facility] [place]

## 2019-06-23 NOTE — Plan of Care (Signed)
  Problem: Education: Goal: Knowledge of General Education information will improve Description: Including pain rating scale, medication(s)/side effects and non-pharmacologic comfort measures Outcome: Progressing   Problem: Pain Managment: Goal: General experience of comfort will improve Outcome: Progressing   Problem: Safety: Goal: Ability to remain free from injury will improve Outcome: Progressing   

## 2019-06-23 NOTE — Consult Note (Addendum)
Name: Anita Reed MRN: [id number] DOB: Apr 09, 1961    ADMISSION DATE:  06/22/2019 CONSULTATION DATE: 06/23/2019  REFERRING MD : Rufina Falco, NP   CHIEF COMPLAINT: Abnormal Labs   BRIEF PATIENT DESCRIPTION:  58 yo female with decompensated alcoholic cirrhosis admitted with hypokalemia, hypomagnesia, atrial fibrillation/tachycardia, moderate ascites, and possible SBP  SIGNIFICANT EVENTS/STUDIES:  10/23: Pt admitted to the telemetry unit with abnormal lab values 10/24: CT Abd Pelvis revealed small right pleural effusion with right lung base atelectasis or infiltrate. Clinical correlation is recommended. Decompensated cirrhosis. Moderate ascites, increased since the prior CT. No bowel obstruction. 10/24: Pt transferred to the stepdown unit with tachycardia and hypotension concerning for possible sepsis   HISTORY OF PRESENT ILLNESS:   This is a 58 yo female with a PMH of C. Difficile Colitis, Portal Hypertension, Portal Hypertensive Gastropathy, Spontaneous Bacterial Perotonitis, Hepatic Encephalopathy, Decompensated Alcoholic Cirrhosis, Esophageal Varices, and  Ascites.  She presented to [facility] ER on 10/23 with abnormal lab values.  Pt recently discharged from [facility] on 10/12 following treatment of GI bleed secondary to liver cirrhosis/portal hypertension in setting of NSAID use (underwent elective colonoscopy which revealed pseudomembranous enterocolitis, blood in the entire examined colon, and non bleeding internal hemorrhoids) and SBP.  At discharge pt prescribed oral vancomycin, bactrim, and protonix.  According to pt she has completed all abx as prescribed. She saw her PCP on 10/23 for hospital follow-up appointment and pt noted to have abdominal distension with significant pain, PCP recommended pt proceed to  ER for additional evaluation, however pt refused.  Therefore, stat labs obtained and pt went home.  Later on during the day on 10/23 pt notified via telephone lab values were abnormal K+  critically low '@2'$ .6 and instructed to proceed to the ER.  Upon arrival to the ER pts bp 85/62, heart rate 128, and pt c/o abdominal/bilateral lower extremity pain/swelling.  ER labs revealed Na+ 131, K+ 2.5, chloride 93, glucose 117, magnesium 1.5, pct 0.40, lactic acid 1.9, wbc 12.3, UA negative, and COVID-19 negative.  She received electrolyte replacement and pain medication.  ER physician contacted IR to determine if pt could undergo therapeutic paracentesis, however unable to perform per IR.  Therefore, ER physician performed diagnostic paracentesis and ordered ceftriaxone for empiric SBP coverage.  She was subsequently admitted to the telemetry unit for additional workup and treatment.  On 10/24 pt tachycardic/atrial fibrillation hr 134 and sbp 80's despite 750 ml NS bolus x1 dose.  CT Abd Pelvis revealed small right pleural effusion and right lung base atelectasis vs. infiltrate, decompensated cirrhosis, moderate ascites, but no bowel obstruction.  She was subsequently transferred to the stepdown unit per hospitalist team for additional workup and treatment.     PAST MEDICAL HISTORY :   has a past medical history of Anemia, Cancer ([place]), Cirrhosis ([place]), and Hypertension.  has a past surgical history that includes Abdominal hysterectomy; Appendectomy; Esophagogastroduodenoscopy (N/A, 01/29/2019); Tonsillectomy; Esophagogastroduodenoscopy (N/A, 06/01/2019); and Colonoscopy (N/A, 06/03/2019). Prior to Admission medications   Medication Sig Start Date End Date Taking? Authorizing Provider  furosemide (LASIX) 40 MG tablet Take 40 mg by mouth 2 (two) times daily.   Yes [provider]  pantoprazole (PROTONIX) 40 MG tablet Take 1 tablet (40 mg total) by mouth 2 (two) times daily. 01/31/19 01/31/20 Yes Pyreddy, Reatha Harps, MD  traMADol (ULTRAM) 50 MG tablet Take 1 tablet (50 mg total) by mouth 2 (two) times daily for 7 days. 06/22/19 06/29/19 Yes Hubbard Hartshorn, FNP  baclofen (LIORESAL) 10 MG tablet  Take 1  tablet (10 mg total) by mouth 3 (three) times daily. Muscle Relaxer 06/22/19   Hubbard Hartshorn, FNP   No Known Allergies  FAMILY HISTORY:  family history includes Cancer in her father and mother. SOCIAL HISTORY:  reports that she has been smoking. She has never used smokeless tobacco. She reports previous alcohol use. She reports that she does not use drugs.  REVIEW OF SYSTEMS: Positives in BOLD Constitutional: Negative for fever, chills, weight loss, malaise/fatigue and diaphoresis.  HENT: Negative for hearing loss, ear pain, nosebleeds, congestion, sore throat, neck pain, tinnitus and ear discharge.   Eyes: Negative for blurred vision, double vision, photophobia, pain, discharge and redness.  Respiratory: Negative for cough, hemoptysis, sputum production, shortness of breath, wheezing and stridor.   Cardiovascular: Negative for chest pain, palpitations, orthopnea, claudication, leg swelling and PND.  Gastrointestinal: heartburn, nausea, vomiting, abdominal pain and distension, diarrhea, constipation, blood in stool and melena.  Genitourinary: Negative for dysuria, urgency, frequency, hematuria and flank pain.  Musculoskeletal: Negative for myalgias, back pain, joint pain and falls.  Skin: Negative for itching and rash.  Neurological: Negative for dizziness, tingling, tremors, sensory change, speech change, focal weakness, seizures, loss of consciousness, weakness and headaches.  Endo/Heme/Allergies: Negative for environmental allergies and polydipsia. Does not bruise/bleed easily.  SUBJECTIVE:  Pt c/o abdominal pain   VITAL SIGNS: Temp:  [97.5 F (36.4 C)-98.7 F (37.1 C)] 98.7 F (37.1 C) (10/24 0000) Pulse Rate:  [102-135] 128 (10/24 0054) Resp:  [12-18] 18 (10/24 0000) BP: (85-130)/(54-89) 89/61 (10/24 0210) SpO2:  [97 %-99 %] 97 % (10/24 0000) Weight:  [74.8 kg-76.2 kg] 76.2 kg (10/23 2029)  PHYSICAL EXAMINATION: General: chronically ill appearing female, NAD  Neuro: alert  and oriented, follows commands  HEENT: supple, no JVD  Cardiovascular: sinus tachycardia, no R/G   Lungs: clear throughout, even, non labored  Abdomen: +BS x4, abdominal tenderness and distension (moderate ascites) Musculoskeletal: 3+ bilateral lower extremity pitting edema, moves all extremities Skin: intact no rashes or lesions present   Recent Labs  Lab 06/22/19 1332 06/22/19 1600 06/23/19 0204  NA 133* 131*  --   K 2.6* 2.5* 2.8*  CL 94* 93*  --   CO2 25 25  --   BUN 8 8  --   CREATININE 0.68 0.62 0.50  GLUCOSE 123* 117*  --    Recent Labs  Lab 06/22/19 1332 06/22/19 1600 06/23/19 0204  HGB 10.4* 10.4* 8.9*  HCT 33.0* 33.2* 27.6*  WBC 13.3* 12.3* 13.2*  PLT 223 222 200   Ct Abdomen Pelvis W Contrast  Result Date: 06/23/2019 CLINICAL DATA:  58 year old female with abdominal pain. EXAM: CT ABDOMEN AND PELVIS WITH CONTRAST TECHNIQUE: Multidetector CT imaging of the abdomen and pelvis was performed using the standard protocol following bolus administration of intravenous contrast. CONTRAST:  12m OMNIPAQUE IOHEXOL 300 MG/ML  SOLN COMPARISON:  CT of the abdomen pelvis dated 06/04/2019 FINDINGS: Lower chest: There is a small right pleural effusion with right lung base atelectasis or infiltrate. Clinical correlation is recommended. There is eventration of the right hemidiaphragm similar to prior CT. Coronary vascular calcifications noted. No intra-abdominal free air. Moderate ascites, increased since the prior CT. Hepatobiliary: Cirrhosis. No intrahepatic biliary ductal dilatation. No calcified gallstone. Pancreas: Unremarkable. No pancreatic ductal dilatation or surrounding inflammatory changes. Spleen: Normal in size without focal abnormality. Adrenals/Urinary Tract: The adrenal glands are unremarkable. There is no hydronephrosis on either side. There is symmetric enhancement and excretion of contrast by both kidneys.  The urinary bladder is unremarkable. Stomach/Bowel: There is  loose stool throughout the colon. There is a small hiatal hernia. No bowel obstruction. Appendectomy. Vascular/Lymphatic: Advanced aortoiliac atherosclerotic disease. The IVC is unremarkable. The SMV, splenic vein, and main portal vein are patent. No portal venous gas. Paraesophageal and gastric varices noted. There is recanalization of the paraumbilical vein. Reproductive: Hysterectomy. Other: Diffuse subcutaneous edema and anasarca, increased since the prior CT. Musculoskeletal: No acute or significant osseous findings. IMPRESSION: 1. Small right pleural effusion with right lung base atelectasis or infiltrate. Clinical correlation is recommended. 2. Decompensated cirrhosis. 3. Moderate ascites, increased since the prior CT. 4. No bowel obstruction. Aortic Atherosclerosis (ICD10-I70.0). Electronically Signed   By: Anner Crete M.D.   On: 06/23/2019 02:09    ASSESSMENT / PLAN:  Decompensated alcoholic cirrhosis with moderate ascites  Abdominal pain  Elevated alk phos and AST Possible SBP  Hx: Esophageal varices, portal hypertension  IR Paracentesis pending  GI consulted appreciate input  Continue empiric ceftriaxone for SBP and flagyl  Trend WBC and monitor fever curve Trend PCT  Follow cultures  Continue protonix  Enteric precautions per protocol  Prn morphine and percocet for pain management   Atrial fibrillation Chronic hypotension  Bilateral lower extremity edema  Continuous telemetry monitoring  Maintain sbp 85 or higher  Hold outpatient lasix for now  Venous US bilateral lower extremities to r/o DVT   Hyponatremia, hypomagnesia, and hypokalemia  Trend BMP  Replace electrolytes as indicated  Monitor UOP Will start NS with KCL 40 meq/L '@75'$  ml/hr   Acute on chronic anemia VTE px: SCD's, avoid chemical prophylaxis  Trend CBC  Monitor for s/sx of bleeding and transfuse for hgb <7  Poor po intake  Dietitian consulted appreciate input  -Will consult palliative care to  discuss goals of treatment. Pt stating she is "tired" of coming back and forth to the hospital and her abdomen "swelling" due to fluid  Marda Stalker, [place] Pager [phone] (please enter 7 digits) [place] Pager [phone] (please enter 7 digits)

## 2019-06-23 NOTE — Consult Note (Signed)
PHARMACY CONSULT NOTE - FOLLOW UP  Pharmacy Consult for Electrolyte Monitoring and Replacement   Recent Labs: Potassium (mmol/L)  Date Value  06/23/2019 2.8 (L)   Magnesium (mg/dL)  Date Value  06/22/2019 1.5 (L)   Calcium (mg/dL)  Date Value  06/22/2019 8.2 (L)   Albumin (g/dL)  Date Value  06/22/2019 2.9 (L)   Sodium (mmol/L)  Date Value  06/22/2019 131 (L)     Assessment: Pharmacy was consulted for electrolyte management.  KCL PO 40 mEq x1 dose and KCL IV 10 mEq x2 runsordered. Magnesium IV 2 x1 dose ordered  K 2.5 Mg 1.5  Goal of Therapy:  Electrolytes WNL  Plan:  10/24 @ 0200 K+ 2.8 orders placed for NS + 40 mEq at 75 ml/hr and KCI 10 mEq IV x 6. Will recheck w/ am labs.  Tobie Lords ,PharmD Clinical Pharmacist 06/23/2019 5:29 AM

## 2019-06-23 NOTE — Progress Notes (Signed)
Patient ID: Anita Reed, female   DOB: May 12, 1961, 58 y.o.   MRN: [id number]  [facility] PROGRESS NOTE  Anita Reed [id number] DOB: 1961-07-15 DOA: 06/22/2019 PCP: Hubbard Hartshorn, FNP  HPI/Subjective: Patient feeling abdominal pain.  She started developing some diarrhea.  States her legs are very swollen.  Not feeling well at all.  Objective: Vitals:   06/23/19 1000 06/23/19 1100  BP: 98/77 (!) 86/64  Pulse: (!) 125 (!) 122  Resp: 15 12  Temp:    SpO2: (!) 82% 94%    Intake/Output Summary (Last 24 hours) at 06/23/2019 1346 Last data filed at 06/23/2019 1013 Gross per 24 hour  Intake 676.02 ml  Output 375 ml  Net 301.02 ml   Filed Weights   06/22/19 1530 06/22/19 2029  Weight: 74.8 kg 76.2 kg    ROS: Review of Systems  Constitutional: Negative for chills and fever.  Eyes: Negative for blurred vision.  Respiratory: Negative for cough and shortness of breath.   Cardiovascular: Negative for chest pain.  Gastrointestinal: Positive for abdominal pain and diarrhea. Negative for constipation, nausea and vomiting.  Genitourinary: Negative for dysuria.  Musculoskeletal: Negative for joint pain.  Neurological: Negative for dizziness and headaches.   Exam: Physical Exam  Constitutional: She is oriented to person, place, and time.  HENT:  Nose: No mucosal edema.  Mouth/Throat: No oropharyngeal exudate or posterior oropharyngeal edema.  Eyes: Pupils are equal, round, and reactive to light. Conjunctivae, EOM and lids are normal.  Neck: No JVD present. Carotid bruit is not present. No edema present. No thyroid mass and no thyromegaly present.  Cardiovascular: S1 normal and S2 normal. Exam reveals no gallop.  No murmur heard. Respiratory: No respiratory distress. She has decreased breath sounds in the right lower field and the left lower field. She has no wheezes. She has no rhonchi. She has no rales.  GI: Soft. Bowel sounds are normal. She exhibits distension. There  is abdominal tenderness.  Musculoskeletal:     Right ankle: She exhibits swelling.     Left ankle: She exhibits swelling.  Lymphadenopathy:    She has no cervical adenopathy.  Neurological: She is alert and oriented to person, place, and time. No cranial nerve deficit.  Skin: Skin is warm. No rash noted. Nails show no clubbing.  Psychiatric: She has a normal mood and affect.      Data Reviewed: Basic Metabolic Panel: Recent Labs  Lab 06/22/19 1332 06/22/19 1600 06/23/19 0204 06/23/19 0522  NA 133* 131*  --  132*  K 2.6* 2.5* 2.8* 2.8*  CL 94* 93*  --  97*  CO2 25 25  --  24  GLUCOSE 123* 117*  --  102*  BUN 8 8  --  6  CREATININE 0.68 0.62  --  0.56  CALCIUM 8.4* 8.2*  --  7.8*  MG  --  1.5*  --  1.7  PHOS  --   --   --  2.7   Liver Function Tests: Recent Labs  Lab 06/22/19 1332 06/22/19 1600 06/23/19 0522  AST 44* 45* 36  ALT 20 19 17   ALKPHOS 458* 438* 385*  BILITOT 2.2* 2.1* 1.3*  PROT 5.7* 5.5* 4.9*  ALBUMIN 3.0* 2.9* 2.7*   CBC: Recent Labs  Lab 06/22/19 1332 06/22/19 1600 06/23/19 0522  WBC 13.3* 12.3* 10.7*  NEUTROABS 9.9* 9.5*  --   HGB 10.4* 10.4* 8.2*  HCT 33.0* 33.2* 26.5*  MCV 79.5* 79.0* 79.1*  PLT 223 222 177  BNP (last 3 results) Recent Labs    01/18/19 1220  BNP 771.0*    CBG: Recent Labs  Lab 06/23/19 0335  GLUCAP 125*    Recent Results (from the past 240 hour(s))  Blood culture (routine x 2)     Status: None (Preliminary result)   Collection Time: 06/22/19  4:20 PM   Specimen: BLOOD  Result Value Ref Range Status   Specimen Description BLOOD BLOOD RIGHT ARM  Final   Special Requests   Final    BOTTLES DRAWN AEROBIC AND ANAEROBIC Blood Culture adequate volume   Culture   Final    NO GROWTH < 24 HOURS Performed at [facility], [address], [place], [place] [address]    Report Status PENDING  Incomplete  Blood culture (routine x 2)     Status: None (Preliminary result)   Collection Time: 06/22/19  4:20  PM   Specimen: BLOOD  Result Value Ref Range Status   Specimen Description BLOOD BLOOD LEFT HAND  Final   Special Requests   Final    BOTTLES DRAWN AEROBIC AND ANAEROBIC Blood Culture adequate volume   Culture   Final    NO GROWTH < 24 HOURS Performed at [facility], [address] [place], [place] [address]    Report Status PENDING  Incomplete  SARS Coronavirus 2 by RT PCR (hospital order, performed in [place] hospital lab) Nasopharyngeal Nasopharyngeal Swab     Status: None   Collection Time: 06/22/19  4:40 PM   Specimen: Nasopharyngeal Swab  Result Value Ref Range Status   SARS Coronavirus 2 NEGATIVE NEGATIVE Final    Comment: (NOTE) If result is NEGATIVE SARS-CoV-2 target nucleic acids are NOT DETECTED. The SARS-CoV-2 RNA is generally detectable in upper and lower  respiratory specimens during the acute phase of infection. The lowest  concentration of SARS-CoV-2 viral copies this assay can detect is 250  copies / mL. A negative result does not preclude SARS-CoV-2 infection  and should not be used as the sole basis for treatment or other  patient management decisions.  A negative result may occur with  improper specimen collection / handling, submission of specimen other  than nasopharyngeal swab, presence of viral mutation(s) within the  areas targeted by this assay, and inadequate number of viral copies  (<250 copies / mL). A negative result must be combined with clinical  observations, patient history, and epidemiological information. If result is POSITIVE SARS-CoV-2 target nucleic acids are DETECTED. The SARS-CoV-2 RNA is generally detectable in upper and lower  respiratory specimens dur ing the acute phase of infection.  Positive  results are indicative of active infection with SARS-CoV-2.  Clinical  correlation with patient history and other diagnostic information is  necessary to determine patient infection status.  Positive results do  not rule out  bacterial infection or co-infection with other viruses. If result is PRESUMPTIVE POSTIVE SARS-CoV-2 nucleic acids MAY BE PRESENT.   A presumptive positive result was obtained on the submitted specimen  and confirmed on repeat testing.  While 2019 novel coronavirus  (SARS-CoV-2) nucleic acids may be present in the submitted sample  additional confirmatory testing may be necessary for epidemiological  and / or clinical management purposes  to differentiate between  SARS-CoV-2 and other Sarbecovirus currently known to infect humans.  If clinically indicated additional testing with an alternate test  methodology [phone]) is advised. The SARS-CoV-2 RNA is generally  detectable in upper and lower respiratory sp ecimens during the acute  phase of infection. The expected result is Negative. Fact Sheet for Patients:  StrictlyIdeas.no Fact Sheet for Healthcare Providers: BankingDealers.co.za This test is not yet approved or cleared by the [place] FDA and has been authorized for detection and/or diagnosis of SARS-CoV-2 by FDA under an Emergency Use Authorization (EUA).  This EUA will remain in effect (meaning this test can be used) for the duration of the COVID-19 declaration under Section 564(b)(1) of the Act, 21 U.S.C. section 360bbb-3(b)(1), unless the authorization is terminated or revoked sooner. Performed at [facility], [address] [place], [place] [address]   Body fluid culture ( includes gram stain)     Status: None (Preliminary result)   Collection Time: 06/22/19  6:44 PM   Specimen: Peritoneal Cavity; Peritoneal Fluid  Result Value Ref Range Status   Specimen Description   Final    PERITONEAL CAVITY Performed at [facility], [address] [place], [place] [address]    Special Requests   Final    Normal Performed at [facility], [address] [place], [place] [address]    Gram Stain    Final    WBC PRESENT,BOTH PMN AND MONONUCLEAR NO ORGANISMS SEEN CYTOSPIN SMEAR    Culture   Final    NO GROWTH < 24 HOURS Performed at [facility], [place] [address] [place], [place] [address]    Report Status PENDING  Incomplete  MRSA PCR Screening     Status: None   Collection Time: 06/23/19  4:22 AM   Specimen: Nasopharyngeal  Result Value Ref Range Status   MRSA by PCR NEGATIVE NEGATIVE Final    Comment:        The GeneXpert MRSA Assay (FDA approved for NASAL specimens only), is one component of a comprehensive MRSA colonization surveillance program. It is not intended to diagnose MRSA infection nor to guide or monitor treatment for MRSA infections. Performed at [facility], [address], [place], [place] [address]      Studies: Ct Abdomen Pelvis W Contrast  Result Date: 06/23/2019 CLINICAL DATA:  58 year old female with abdominal pain. EXAM: CT ABDOMEN AND PELVIS WITH CONTRAST TECHNIQUE: Multidetector CT imaging of the abdomen and pelvis was performed using the standard protocol following bolus administration of intravenous contrast. CONTRAST:  120mL OMNIPAQUE IOHEXOL 300 MG/ML  SOLN COMPARISON:  CT of the abdomen pelvis dated 06/04/2019 FINDINGS: Lower chest: There is a small right pleural effusion with right lung base atelectasis or infiltrate. Clinical correlation is recommended. There is eventration of the right hemidiaphragm similar to prior CT. Coronary vascular calcifications noted. No intra-abdominal free air. Moderate ascites, increased since the prior CT. Hepatobiliary: Cirrhosis. No intrahepatic biliary ductal dilatation. No calcified gallstone. Pancreas: Unremarkable. No pancreatic ductal dilatation or surrounding inflammatory changes. Spleen: Normal in size without focal abnormality. Adrenals/Urinary Tract: The adrenal glands are unremarkable. There is no hydronephrosis on either side. There is symmetric enhancement and excretion of contrast by  both kidneys. The urinary bladder is unremarkable. Stomach/Bowel: There is loose stool throughout the colon. There is a small hiatal hernia. No bowel obstruction. Appendectomy. Vascular/Lymphatic: Advanced aortoiliac atherosclerotic disease. The IVC is unremarkable. The SMV, splenic vein, and main portal vein are patent. No portal venous gas. Paraesophageal and gastric varices noted. There is recanalization of the paraumbilical vein. Reproductive: Hysterectomy. Other: Diffuse subcutaneous edema and anasarca, increased since the prior CT. Musculoskeletal: No acute or significant osseous findings. IMPRESSION: 1. Small right pleural effusion with right lung base atelectasis or  infiltrate. Clinical correlation is recommended. 2. Decompensated cirrhosis. 3. Moderate ascites, increased since the prior CT. 4. No bowel obstruction. Aortic Atherosclerosis (ICD10-I70.0). Electronically Signed   By: Anner Crete M.D.   On: 06/23/2019 02:09   US Venous Img Lower Bilateral  Result Date: 06/23/2019 CLINICAL DATA:  Bilateral lower extremity edema EXAM: BILATERAL LOWER EXTREMITY VENOUS DOPPLER ULTRASOUND TECHNIQUE: Gray-scale sonography with graded compression, as well as color Doppler and duplex ultrasound were performed to evaluate the lower extremity deep venous systems from the level of the common femoral vein and including the common femoral, femoral, profunda femoral, popliteal and calf veins including the posterior tibial, peroneal and gastrocnemius veins when visible. The superficial great saphenous vein was also interrogated. Spectral Doppler was utilized to evaluate flow at rest and with distal augmentation maneuvers in the common femoral, femoral and popliteal veins. COMPARISON:  None. FINDINGS: RIGHT LOWER EXTREMITY Common Femoral Vein: No evidence of thrombus. Normal compressibility, respiratory phasicity and response to augmentation. Saphenofemoral Junction: No evidence of thrombus. Normal compressibility and  flow on color Doppler imaging. Profunda Femoral Vein: No evidence of thrombus. Normal compressibility and flow on color Doppler imaging. Femoral Vein: No evidence of thrombus. Normal compressibility, respiratory phasicity and response to augmentation. Popliteal Vein: No evidence of thrombus. Normal compressibility, respiratory phasicity and response to augmentation. Calf Veins: No evidence of thrombus. Normal compressibility and flow on color Doppler imaging. Other Findings:  Peripheral subcutaneous edema noted LEFT LOWER EXTREMITY Common Femoral Vein: No evidence of thrombus. Normal compressibility, respiratory phasicity and response to augmentation. Saphenofemoral Junction: No evidence of thrombus. Normal compressibility and flow on color Doppler imaging. Profunda Femoral Vein: No evidence of thrombus. Normal compressibility and flow on color Doppler imaging. Femoral Vein: No evidence of thrombus. Normal compressibility, respiratory phasicity and response to augmentation. Popliteal Vein: No evidence of thrombus. Normal compressibility, respiratory phasicity and response to augmentation. Calf Veins: No evidence of thrombus. Normal compressibility and flow on color Doppler imaging. Other Findings:  Peripheral edema noted IMPRESSION: No evidence of deep venous thrombosis in either lower extremity. Electronically Signed   By: Jerilynn Mages.  Shick M.D.   On: 06/23/2019 11:07   Dg Chest Port 1 View  Result Date: 06/23/2019 CLINICAL DATA:  58 year old female with sepsis. EXAM: PORTABLE CHEST 1 VIEW COMPARISON:  Chest radiograph dated 06/03/2019 FINDINGS: There is mild eventration of the right hemidiaphragm. There is a small right pleural effusion with right lung base atelectasis or infiltrate. An area of hazy density in the right apical subpleural region appears similar to prior radiograph and may represent scarring or developing infiltrate. Clinical correlation is recommended. Or the left lung remains clear. No pneumothorax.  Stable cardiac silhouette. No acute osseous pathology. IMPRESSION: 1. Small right pleural effusion with right lung base atelectasis or infiltrate. 2. Stable area of hazy density in the right apical subpleural region. Clinical correlation is recommended to exclude developing infiltrate. Electronically Signed   By: Anner Crete M.D.   On: 06/23/2019 03:28    Scheduled Meds: . Chlorhexidine Gluconate Cloth  6 each Topical Daily  . digoxin  0.25 mg Oral Q6H  . pantoprazole  40 mg Oral BID  . potassium chloride  40 mEq Oral BID   Continuous Infusions: . cefTRIAXone (ROCEPHIN)  IV 2 g (06/23/19 0802)  . metronidazole 500 mg (06/23/19 1129)    Assessment/Plan:  1. Clinical sepsis, present on admission.  Possibility of SBP versus pneumonia.  On Rocephin and Flagyl.  The patient does have a history of C. difficile  and if we are can continue antibiotics recommend adding oral vancomycin since the patient is now having diarrhea.  Case discussed with Dr. Lanney Gins covering the ICU. 2. Cirrhosis of the liver with ascites that is recurrent.  Mortality is very high within 2 years of developing ascites.  Hopefully can go back on Lasix and Aldactone.  Blood pressure is the limiting factor at this point.  Case discussed with critical care specialist and they will do a paracentesis today. 3. Hypotension.  Patient states that her blood pressure has been low for a while. 4. Atrial fibrillation with rapid ventricular response.  [facility] consultation.  Patient ordered for 3 doses of digoxin.  Again limited with hypotension.  Not a candidate for anticoagulation currently. 5. Hypokalemia and hypomagnesemia.  Replacement as per pharmacy.  Code Status:     Code Status Orders  (From admission, onward)         Start     Ordered   06/22/19 2023  Do not attempt resuscitation (DNR)  Continuous    Question Answer Comment  In the event of cardiac or respiratory ARREST Do not call a "code blue"   In  the event of cardiac or respiratory ARREST Do not perform Intubation, CPR, defibrillation or ACLS   In the event of cardiac or respiratory ARREST Use medication by any route, position, wound care, and other measures to relive pain and suffering. May use oxygen, suction and manual treatment of airway obstruction as needed for comfort.   Comments RN may pronounce      06/22/19 2023        Code Status History    Date Active Date Inactive Code Status Order ID Comments User Context   06/01/2019 2148 06/11/2019 2150 Full Code [id number]  Bettey Costa, MD Inpatient   01/28/2019 1920 01/31/2019 1827 Full Code [id number]  Otila Back, MD ED   01/18/2019 1651 01/19/2019 1946 DNR [id number]  Otila Back, MD ED   Advance Care Planning Activity     Family Communication: As per critical care specialist Disposition Plan: To be determined  Consultants:  Critical care specialist  Cardiology  Gastroenterology  Antibiotics:  Rocephin  Flagyl  Time spent: 28 minutes, case discussed with critical care specialist.  [facility]

## 2019-06-23 NOTE — Progress Notes (Signed)
A&O X 4. Pt states having a pain score of 7 on a 0-10 pain scale. Denies having SOB at this time. Pt states that the pain medication does not give her lasting relief. Dr. Lanney Gins notified, will review pain management plan.

## 2019-06-23 NOTE — Consult Note (Signed)
[name] , MD [address] [place], [place], [place], [address] [address], [place], [place], [place], [address] Phone: [phone]  Fax: [phone]  Consultation  Referring Provider:   Dr Margaretmary Eddy Primary Care Physician:  Hubbard Hartshorn, FNP Primary Gastroenterologist:  Dr. Marius Ditch         Reason for Consultation:     SBP  Date of Admission:  06/22/2019 Date of Consultation:  06/23/2019         HPI:   Milliani Jory is a 58 y.o. female who has a diagnosis of decompensated alcoholic cirrhosis diagnosed in May 2020 with ascites, upper GI bleed in June 2020 which was treated with clips for bleeding dielefoy lesion.  History of chronic hepatitis C that has been treated in the past.  Does not drink alcohol.  She was admitted in October 2020 With an acute upper GI bleed with a hemoglobin of 5.1 g, nonbleeding esophageal varices, portal hypertensive gastropathy was seen.  No active bleeding noted.  On 06/03/2019 she underwent a colonoscopy, pseudomembranes were seen in the colon and bladder seen throughout the colon.  Treated for C. difficile colitis.  Also found to have spontaneous bacterial peritonitis commenced on ciprofloxacin.  She was discharged on 10 days of vancomycin completed her antibiotics found to have a low potassium and magnesium on outpatient lab work and sent to the hospital.  In the ER was found to have hypotension and tachycardia with heart rate of 130.  She was complaining of diffuse abdominal pain.  Found to be in A. fib with RVR.  Dr. Margaretmary Eddy contacted me last evening and was not sure as the source of sepsis.  Ascitic fluid tested yesterday was negative for SBP. On admission hemoglobin was 10.4 g with an MCV of 79.5 and a white cell count of 13.3 Creatinine was 0.62, INR 1.2, alkaline phosphatase 438.  Paracentesis fluid culture pending.  This morning hemoglobin 8.2 g.  CT scan of the abdomen demonstrated small right pleural effusion with right lung base atelectasis or  infiltrate moderate ascites.  No bowel obstruction.  Says diarrhea much better today and only complaint is abdominal pain.  Past Medical History:  Diagnosis Date   Anemia    Cancer ([place])    Cirrhosis ([place])    Hypertension     Past Surgical History:  Procedure Laterality Date   ABDOMINAL HYSTERECTOMY     APPENDECTOMY     COLONOSCOPY N/A 06/03/2019   Procedure: COLONOSCOPY;  Surgeon: Toledo, Benay Pike, MD;  Location: [facility] ENDOSCOPY;  Service: Gastroenterology;  Laterality: N/A;   ESOPHAGOGASTRODUODENOSCOPY N/A 01/29/2019   Procedure: ESOPHAGOGASTRODUODENOSCOPY (EGD);  Surgeon: [name], MD;  Location: [facility] ENDOSCOPY;  Service: Gastroenterology;  Laterality: N/A;   ESOPHAGOGASTRODUODENOSCOPY N/A 06/01/2019   Procedure: ESOPHAGOGASTRODUODENOSCOPY (EGD);  Surgeon: Toledo, Benay Pike, MD;  Location: [facility] ENDOSCOPY;  Service: Gastroenterology;  Laterality: N/A;   TONSILLECTOMY      Prior to Admission medications   Medication Sig Start Date End Date Taking? Authorizing Provider  furosemide (LASIX) 40 MG tablet Take 40 mg by mouth 2 (two) times daily.   Yes [provider]  pantoprazole (PROTONIX) 40 MG tablet Take 1 tablet (40 mg total) by mouth 2 (two) times daily. 01/31/19 01/31/20 Yes Pyreddy, Reatha Harps, MD  traMADol (ULTRAM) 50 MG tablet Take 1 tablet (50 mg total) by mouth 2 (two) times daily for 7 days. 06/22/19 06/29/19 Yes Hubbard Hartshorn, FNP  baclofen (LIORESAL) 10 MG tablet Take 1 tablet (10 mg  total) by mouth 3 (three) times daily. Muscle Relaxer 06/22/19   Hubbard Hartshorn, FNP    Family History  Problem Relation Age of Onset   Cancer Mother    Cancer Father      Social History   Tobacco Use   Smoking status: Current Every Day Smoker   Smokeless tobacco: Never Used  Substance Use Topics   Alcohol use: Not Currently   Drug use: Never    Allergies as of 06/22/2019   (No Known Allergies)    Review of Systems:    All systems reviewed and  negative except where noted in HPI.   Physical Exam:  Vital signs in last 24 hours: Temp:  [97.5 F (36.4 C)-98.7 F (37.1 C)] 97.9 F (36.6 C) (10/24 0735) Pulse Rate:  [102-135] 120 (10/24 0900) Resp:  [10-18] 12 (10/24 0900) BP: (77-130)/(54-89) 82/65 (10/24 0900) SpO2:  [92 %-99 %] 96 % (10/24 0900) Weight:  [74.8 kg-76.2 kg] 76.2 kg (10/23 2029) Last BM Date: 06/22/19 General:   Pleasant, cooperative in NAD Head:  Normocephalic and atraumatic. Eyes:   No icterus.   Conjunctiva pink. PERRLA. Ears:  Normal auditory acuity. Neck:  Supple; no masses or thyroidomegaly Lungs: Respirations even and unlabored. Lungs clear to auscultation bilaterally.   No wheezes, crackles, or rhonchi.  Heart:  Regular rate and rhythm;  Without murmur, clicks, rubs or gallops Abdomen:  Distended tense, mild generalized tenderness, fluid thrill +, . No appreciable masses or hepatomegaly.  No rebound or guarding.  Neurologic:  Alert and oriented x3;  grossly normal neurologically. Skin:  Intact without significant lesions or rashes. Cervical Nodes:  No significant cervical adenopathy. Psych:  Alert and cooperative. Normal affect.  LAB RESULTS: Recent Labs    06/22/19 1332 06/22/19 1600 06/23/19 0522  WBC 13.3* 12.3* 10.7*  HGB 10.4* 10.4* 8.2*  HCT 33.0* 33.2* 26.5*  PLT 223 222 177   BMET Recent Labs    06/22/19 1332 06/22/19 1600 06/23/19 0204 06/23/19 0522  NA 133* 131*  --  132*  K 2.6* 2.5* 2.8* 2.8*  CL 94* 93*  --  97*  CO2 25 25  --  24  GLUCOSE 123* 117*  --  102*  BUN 8 8  --  6  CREATININE 0.68 0.62  --  0.56  CALCIUM 8.4* 8.2*  --  7.8*   LFT Recent Labs    06/23/19 0522  PROT 4.9*  ALBUMIN 2.7*  AST 36  ALT 17  ALKPHOS 385*  BILITOT 1.3*   PT/INR Recent Labs    06/22/19 1600 06/23/19 0522  LABPROT 14.7 16.4*  INR 1.2 1.3*    STUDIES: Ct Abdomen Pelvis W Contrast  Result Date: 06/23/2019 CLINICAL DATA:  58 year old female with abdominal pain. EXAM:  CT ABDOMEN AND PELVIS WITH CONTRAST TECHNIQUE: Multidetector CT imaging of the abdomen and pelvis was performed using the standard protocol following bolus administration of intravenous contrast. CONTRAST:  165mL OMNIPAQUE IOHEXOL 300 MG/ML  SOLN COMPARISON:  CT of the abdomen pelvis dated 06/04/2019 FINDINGS: Lower chest: There is a small right pleural effusion with right lung base atelectasis or infiltrate. Clinical correlation is recommended. There is eventration of the right hemidiaphragm similar to prior CT. Coronary vascular calcifications noted. No intra-abdominal free air. Moderate ascites, increased since the prior CT. Hepatobiliary: Cirrhosis. No intrahepatic biliary ductal dilatation. No calcified gallstone. Pancreas: Unremarkable. No pancreatic ductal dilatation or surrounding inflammatory changes. Spleen: Normal in size without focal abnormality. Adrenals/Urinary Tract: The adrenal glands  are unremarkable. There is no hydronephrosis on either side. There is symmetric enhancement and excretion of contrast by both kidneys. The urinary bladder is unremarkable. Stomach/Bowel: There is loose stool throughout the colon. There is a small hiatal hernia. No bowel obstruction. Appendectomy. Vascular/Lymphatic: Advanced aortoiliac atherosclerotic disease. The IVC is unremarkable. The SMV, splenic vein, and main portal vein are patent. No portal venous gas. Paraesophageal and gastric varices noted. There is recanalization of the paraumbilical vein. Reproductive: Hysterectomy. Other: Diffuse subcutaneous edema and anasarca, increased since the prior CT. Musculoskeletal: No acute or significant osseous findings. IMPRESSION: 1. Small right pleural effusion with right lung base atelectasis or infiltrate. Clinical correlation is recommended. 2. Decompensated cirrhosis. 3. Moderate ascites, increased since the prior CT. 4. No bowel obstruction. Aortic Atherosclerosis (ICD10-I70.0). Electronically Signed   By: Anner Crete M.D.   On: 06/23/2019 02:09   Dg Chest Port 1 View  Result Date: 06/23/2019 CLINICAL DATA:  58 year old female with sepsis. EXAM: PORTABLE CHEST 1 VIEW COMPARISON:  Chest radiograph dated 06/03/2019 FINDINGS: There is mild eventration of the right hemidiaphragm. There is a small right pleural effusion with right lung base atelectasis or infiltrate. An area of hazy density in the right apical subpleural region appears similar to prior radiograph and may represent scarring or developing infiltrate. Clinical correlation is recommended. Or the left lung remains clear. No pneumothorax. Stable cardiac silhouette. No acute osseous pathology. IMPRESSION: 1. Small right pleural effusion with right lung base atelectasis or infiltrate. 2. Stable area of hazy density in the right apical subpleural region. Clinical correlation is recommended to exclude developing infiltrate. Electronically Signed   By: Anner Crete M.D.   On: 06/23/2019 03:28      Impression / Plan:   Lasaundra Blust is a 58 y.o. y/o female with a history of decompensated liver cirrhosis, arthritis, SBP in October 2020 and admission complicated by C. difficile colitis admitted to the hospital in shock and sepsis of unclear etiology.  I have been consulted to evaluate for SBP.  I was consulted by Dr. Margaretmary Eddy and I informed her yesterday evening  that the ascites fluid did not show evidence of SBP based on the values.  CT scan of the abdomen shows right pleural effusion.  Plan 1.  Continue evaluation for sepsis, possibly related to a pneumonia? 2.  No evidence of SBP but she will need to continue on prophylaxis for SBP with Bactrim 1 tablet daily indefinitely taken for long-term at discharge. 3.  In terms of management of ascites once she is hemodynamically stable and not septic can consider restarting her back on Aldactone 100 mg and Lasix 40 mg, low-salt diet.. 4.  PPI  5. Abdominal pain due to tense ascitis . Suggest  large-volume  paracentesis and if  greater than 5 L of ascites fluid has been taken off please administer 50 g of 25% albumin.   Thank you for involving me in the care of this patient.      LOS: 1 day   [name], MD  06/23/2019, 10:56 AM

## 2019-06-23 NOTE — Progress Notes (Signed)
eLink Physician-Brief Progress Note Patient Name: Anita Reed DOB: [birth year] MRN: [id number]   Date of Service  06/23/2019  HPI/Events of Note  17 F ETOH cirrhosis, portal hypertension recently admitted for GI bleed and SBP, also c diff. She presents after labs obtained for abdominal pain which revealed K 2.6, Mg 1.5. I the ED she was in afib RVR, hypotensive.   eICU Interventions  Antibiotic for SBP Correct electrolytes Fluid or albumin resuscitation Given digoxin for afib RVR        Raquel Sarna T [name] 06/23/2019, 4:58 AM

## 2019-06-23 NOTE — Progress Notes (Signed)
Continuous cear yellow drainage coming from paracentesis site. Dressing change completed and notified MD.

## 2019-06-23 NOTE — Consult Note (Signed)
Cardiology Consultation:   Patient ID: Anita Reed MRN: [id number]; DOB: [birth year]  Admit date: 06/22/2019 Date of Consult: 06/23/2019  Primary Care Provider: Hubbard Hartshorn, FNP Primary Cardiologist: Iverson Alamin, Dr. Garen Lah rounding Primary Electrophysiologist:  None    Patient Profile:   Anita Reed is a 58 y.o. female with a hx of C. difficile colitis, portal hypertension, portal hypertensive gastropathy, spontaneous bacterial peritonitis, hepatic encephalopathy, decompensated alcoholic cirrhosis, esophageal varices, ascites, current smoker, no known past medical history of heart failure or arrhythmia, and newly diagnosed atrial flutter who is being seen today for the evaluation of new onset atrial flutter in the setting of hypokalemia at the request of Dr. Margaretmary Eddy.  History of Present Illness:   Anita Reed is a 58 yo female with PMH as above.  She currently smokes 1 pack daily with no desire to quit.  She does not use alcohol or illegal drugs.  She underwent echo 01/19/2019 that showed EF 55 to 60%, left ventricular diastolic function could not be evaluated; however, no evidence of left ventricular regional wall motion abnormalities.  It was noted the right ventricular systolic pressure was mildly elevated with an estimated pressure of 35.0 mmHg.  She was also noted to have mild LAE, degenerative MV, mild TR, and dilated IVC with underlying rhythm of atrial flutter.   She was recently discharged from [facility] 10/12 and following treatment of GI bleed secondary to liver cirrhosis/portal hypertension. Elective colonoscopy revealed pseudomembranous enterocolitis with blood throughout the colon and nonbleeding internal hemorrhoids.  At discharge, she was prescribed oral vancomycin, Bactrim, and Protonix.  She completed all antibiotics as prescribed, per patient.  She saw her PCP 10/23 for hospital follow-up and was noted to have abdominal distention and significant pain.  The PCP recommended she  proceed to the ER for additional evaluation; however, the patient refused.  Stat labs were obtained, and the patient went home.  On 10/23, she was notified via telephone that lab values were abnormal with critically low potassium at 2.6 and instructed to proceed to the ER.  Upon arrival, she was noted to be both hypotensive and tachycardic with rates into the 130s.  She was complaining of diffuse abdominal pain and tightness, as well as nausea and emesis.  She was started on electrolyte repletion.,  As well as pain medication.  Diagnostic paracentesis was performed and abx ordered. On 10/24 she was found to be tachycardic with rates into the 130s and hypotensive despite NS bolus. She was subsequently transferred from telemetry to ICU.  CT abdomen pelvis revealed small right pleural effusion and right lung base atelectasis versus infiltrate, decompensated cirrhosis, moderate ascites, and no bowel obstruction. Cardiology consulted.    On consultation today, the patient denies any current chest pain.  She reports occasional palpitations, which do not significantly influence her lifestyle.  She also notes that she occasionally feels her heart race when she lays down in bed; however, it has never bothered her before in the past.  To her knowledge, she does not have history of heart failure or arrhythmia.  She denies any past history of orthopnea, abdominal distention, or worsening volume status. She does get SOB with extreme activity but is able to normally climb a flight of stairs or walk a block on her own. She reports that her abdominal distention and LEE is recent in onset. Her main complaint is of GI symptoms. She also does not have significant family cardiac history.  Heart Pathway Score:     Past Medical  History:  Diagnosis Date   Anemia    Cancer ([place])    Cirrhosis ([place])    Hypertension     Past Surgical History:  Procedure Laterality Date   ABDOMINAL HYSTERECTOMY     APPENDECTOMY      COLONOSCOPY N/A 06/03/2019   Procedure: COLONOSCOPY;  Surgeon: Toledo, Benay Pike, MD;  Location: [facility] ENDOSCOPY;  Service: Gastroenterology;  Laterality: N/A;   ESOPHAGOGASTRODUODENOSCOPY N/A 01/29/2019   Procedure: ESOPHAGOGASTRODUODENOSCOPY (EGD);  Surgeon: [name], MD;  Location: [facility] ENDOSCOPY;  Service: Gastroenterology;  Laterality: N/A;   ESOPHAGOGASTRODUODENOSCOPY N/A 06/01/2019   Procedure: ESOPHAGOGASTRODUODENOSCOPY (EGD);  Surgeon: Toledo, Benay Pike, MD;  Location: [facility] ENDOSCOPY;  Service: Gastroenterology;  Laterality: N/A;   TONSILLECTOMY       Home Medications:  Prior to Admission medications   Medication Sig Start Date End Date Taking? Authorizing Provider  furosemide (LASIX) 40 MG tablet Take 40 mg by mouth 2 (two) times daily.   Yes [provider]  pantoprazole (PROTONIX) 40 MG tablet Take 1 tablet (40 mg total) by mouth 2 (two) times daily. 01/31/19 01/31/20 Yes Pyreddy, Reatha Harps, MD  traMADol (ULTRAM) 50 MG tablet Take 1 tablet (50 mg total) by mouth 2 (two) times daily for 7 days. 06/22/19 06/29/19 Yes Hubbard Hartshorn, FNP  baclofen (LIORESAL) 10 MG tablet Take 1 tablet (10 mg total) by mouth 3 (three) times daily. Muscle Relaxer 06/22/19   Hubbard Hartshorn, FNP    Inpatient Medications: Scheduled Meds:  Chlorhexidine Gluconate Cloth  6 each Topical Daily   pantoprazole  40 mg Oral BID   Continuous Infusions:  0.9 % NaCl with KCl 40 mEq / L     cefTRIAXone (ROCEPHIN)  IV 2 g (06/23/19 0802)   metronidazole 100 mL/hr at 06/23/19 0446   potassium chloride 10 mEq (06/23/19 0734)   PRN Meds: morphine injection, ondansetron **OR** ondansetron (ZOFRAN) IV, oxyCODONE-acetaminophen  Allergies:   No Known Allergies  Social History:   Social History   Socioeconomic History   Marital status: Married    Spouse name: [name]   Number of children: 1   Years of education: 9   Highest education level: 9th grade  Occupational History   Occupation:  disabled  Scientist, product/process development strain: Somewhat hard   Food insecurity    Worry: Sometimes true    Inability: Sometimes true   Transportation needs    Medical: No    Non-medical: No  Tobacco Use   Smoking status: Current Every Day Smoker   Smokeless tobacco: Never Used  Substance and Sexual Activity   Alcohol use: Not Currently   Drug use: Never   Sexual activity: Not Currently  Lifestyle   Physical activity    Days per week: 0 days    Minutes per session: 0 min   Stress: Only a little  Relationships   Press photographer on phone: Once a week    Gets together: Never    Attends religious service: Never    Active member of club or organization: No    Attends meetings of clubs or organizations: Never    Relationship status: Married   Intimate partner violence    Fear of current or ex partner: No    Emotionally abused: No    Physically abused: No    Forced sexual activity: No  Other Topics Concern   Not on file  Social History Narrative   Not on file    Family History:  No family history of cardiac dz Family History  Problem Relation Age of Onset   Cancer Mother    Cancer Father      ROS:  Please see the history of present illness.  Review of Systems  Constitutional: Positive for malaise/fatigue.  Respiratory: Positive for shortness of breath and wheezing.   Cardiovascular: Positive for palpitations. Negative for chest pain, orthopnea, claudication, leg swelling and PND.       Rare palpitations, racing HR  Gastrointestinal: Positive for abdominal pain, nausea and vomiting.  Genitourinary: Positive for flank pain.  All other systems reviewed and are negative.   All other ROS reviewed and negative.     Physical Exam/Data:   Vitals:   06/23/19 0500 06/23/19 0600 06/23/19 0700 06/23/19 0735  BP: (!) 82/59  90/67   Pulse: (!) 124 (!) 123 (!) 125 (!) 123  Resp: 14 12 13 10   Temp:    97.9 F (36.6 C)  TempSrc:     Axillary  SpO2: 95% 96% 96% 92%  Weight:      Height:        Intake/Output Summary (Last 24 hours) at 06/23/2019 0858 Last data filed at 06/23/2019 0802 Gross per 24 hour  Intake 676.02 ml  Output 300 ml  Net 376.02 ml   Last 3 Weights 06/22/2019 06/22/2019 06/22/2019  Weight (lbs) 168 lb 165 lb 165 lb 3.2 oz  Weight (kg) 76.204 kg 74.844 kg 74.934 kg     Body mass index is 25.54 kg/m.  General: Frail female, appears older than stated age 1: normal Neck: no JVD Vascular:  radial pulses 2+ bilaterally Cardiac:  normal S1, S2; tachycardic, IRIR; no murmur  Lungs:  Bilateral wheezing, crackles on L, reduced breath sounds at base of R  Abd: firm, distended   Ext: non-pitting mild lower extremity edema Musculoskeletal:  No deformities, BUE and BLE strength normal and equal Skin: warm and dry  Neuro:  No focal abnormalities noted Psych:  Normal affect   EKG:  The EKG was personally reviewed and demonstrates:  Sinus tachycardia, 127 bpm, IVCD with QRS 113 ms, repolarization abnormality noted, low voltage, poor R wave progression Telemetry:  Telemetry was personally reviewed and demonstrates: Atrial flutter with ventricular rates into the 120s, earlier sinus tachycardia   Relevant CV Studies: 01/19/2019  1. The left ventricle has normal systolic function, with an ejection fraction of 55-60%. The cavity size was normal. Left ventricular diastolic function could not be evaluated. No evidence of left ventricular regional wall motion abnormalities.  2. The right ventricle has normal systolic function. The cavity was not assessed. There is no increase in right ventricular wall thickness. Right ventricular systolic pressure is mildly elevated with an estimated pressure of 35.0 mmHg.  3. Left atrial size was mildly dilated.  4. The mitral valve is degenerative. Mild thickening of the mitral valve leaflet. There is moderate mitral annular calcification present. No evidence of mitral valve  stenosis.  5. The tricuspid valve is grossly normal. Tricuspid valve regurgitation is mild-moderate.  6. The aortic valve is tricuspid. Moderate thickening of the aortic valve. Mild calcification of the aortic valve.  7. The aortic root is normal in size and structure.  8. The inferior vena cava was dilated in size with <50% respiratory variability.  9. Underlying rhythm appears to be atrial flutter.   Laboratory Data:  High Sensitivity Troponin:   Recent Labs  Lab 06/01/19 1251 06/01/19 1926  TROPONINIHS 12 15     Cardiac EnzymesNo  results for input(s): TROPONINI in the last 168 hours. No results for input(s): TROPIPOC in the last 168 hours.  Chemistry Recent Labs  Lab 06/22/19 1332 06/22/19 1600 06/23/19 0204 06/23/19 0522  NA 133* 131*  --  132*  K 2.6* 2.5* 2.8* 2.8*  CL 94* 93*  --  97*  CO2 25 25  --  24  GLUCOSE 123* 117*  --  102*  BUN 8 8  --  6  CREATININE 0.68 0.62  --  0.56  CALCIUM 8.4* 8.2*  --  7.8*  GFRNONAA >60 >60  --  >60  GFRAA >60 >60  --  >60  ANIONGAP 14 13  --  11    Recent Labs  Lab 06/22/19 1332 06/22/19 1600 06/23/19 0522  PROT 5.7* 5.5* 4.9*  ALBUMIN 3.0* 2.9* 2.7*  AST 44* 45* 36  ALT 20 19 17   ALKPHOS 458* 438* 385*  BILITOT 2.2* 2.1* 1.3*   Hematology Recent Labs  Lab 06/22/19 1332 06/22/19 1600 06/23/19 0522  WBC 13.3* 12.3* 10.7*  RBC 4.15 4.20 3.35*  HGB 10.4* 10.4* 8.2*  HCT 33.0* 33.2* 26.5*  MCV 79.5* 79.0* 79.1*  MCH 25.1* 24.8* 24.5*  MCHC 31.5 31.3 30.9  RDW 19.2* 19.1* 19.0*  PLT 223 222 177   BNPNo results for input(s): BNP, PROBNP in the last 168 hours.  DDimer No results for input(s): DDIMER in the last 168 hours.   Radiology/Studies:  Ct Abdomen Pelvis W Contrast  Result Date: 06/23/2019 CLINICAL DATA:  58 year old female with abdominal pain. EXAM: CT ABDOMEN AND PELVIS WITH CONTRAST TECHNIQUE: Multidetector CT imaging of the abdomen and pelvis was performed using the standard protocol following  bolus administration of intravenous contrast. CONTRAST:  189mL OMNIPAQUE IOHEXOL 300 MG/ML  SOLN COMPARISON:  CT of the abdomen pelvis dated 06/04/2019 FINDINGS: Lower chest: There is a small right pleural effusion with right lung base atelectasis or infiltrate. Clinical correlation is recommended. There is eventration of the right hemidiaphragm similar to prior CT. Coronary vascular calcifications noted. No intra-abdominal free air. Moderate ascites, increased since the prior CT. Hepatobiliary: Cirrhosis. No intrahepatic biliary ductal dilatation. No calcified gallstone. Pancreas: Unremarkable. No pancreatic ductal dilatation or surrounding inflammatory changes. Spleen: Normal in size without focal abnormality. Adrenals/Urinary Tract: The adrenal glands are unremarkable. There is no hydronephrosis on either side. There is symmetric enhancement and excretion of contrast by both kidneys. The urinary bladder is unremarkable. Stomach/Bowel: There is loose stool throughout the colon. There is a small hiatal hernia. No bowel obstruction. Appendectomy. Vascular/Lymphatic: Advanced aortoiliac atherosclerotic disease. The IVC is unremarkable. The SMV, splenic vein, and main portal vein are patent. No portal venous gas. Paraesophageal and gastric varices noted. There is recanalization of the paraumbilical vein. Reproductive: Hysterectomy. Other: Diffuse subcutaneous edema and anasarca, increased since the prior CT. Musculoskeletal: No acute or significant osseous findings. IMPRESSION: 1. Small right pleural effusion with right lung base atelectasis or infiltrate. Clinical correlation is recommended. 2. Decompensated cirrhosis. 3. Moderate ascites, increased since the prior CT. 4. No bowel obstruction. Aortic Atherosclerosis (ICD10-I70.0). Electronically Signed   By: Anner Crete M.D.   On: 06/23/2019 02:09   Dg Chest Port 1 View  Result Date: 06/23/2019 CLINICAL DATA:  58 year old female with sepsis. EXAM: PORTABLE  CHEST 1 VIEW COMPARISON:  Chest radiograph dated 06/03/2019 FINDINGS: There is mild eventration of the right hemidiaphragm. There is a small right pleural effusion with right lung base atelectasis or infiltrate. An area of hazy density in the right  apical subpleural region appears similar to prior radiograph and may represent scarring or developing infiltrate. Clinical correlation is recommended. Or the left lung remains clear. No pneumothorax. Stable cardiac silhouette. No acute osseous pathology. IMPRESSION: 1. Small right pleural effusion with right lung base atelectasis or infiltrate. 2. Stable area of hazy density in the right apical subpleural region. Clinical correlation is recommended to exclude developing infiltrate. Electronically Signed   By: Anner Crete M.D.   On: 06/23/2019 03:28    Assessment and Plan:   Paroxysmal atrial flutter with RVR --Relatively asymptomatic in atrial flutter.  On review of EMR, it appears that she has been in atrial flutter in the past and that this is not new for her.  She does report feeling racing heart rate and palpitations in the past. --Likely that underlying GI issues and subsequent electrolyte imbalance triggered her most recent episode of atrial flutter.  Recommend correct electrolyte issues as below, as well as underlying GI issues. --12/2018 echo as above with nl EF and degenerative MV, as well as dilated LA. --Plan for rate control only at this time. Per MD, started on digoxin 0.25mg  q6h x3 with digoxin level recheck tomorrow.  Current renal function stable with creatinine 0.56 and BUN 6.  Current rate control options limited by hypotension.  Amiodarone not recommended at this time, given pharmacologic conversion still carries risk of thrombosis. --Patient is not therapeutically anticoagulated.  Given her recent GI issues and GI bleed issues, do not recommend anticoagulation at this time as current risks of acute bleeding outweigh that of the benefit of  anticoagulation. Current CHA2DS2VASc score of at least  (age, female) 2 with further labs ordered including A1C for further risk stratification.  --No current plan for cardioversion as not therapeutically anticoagulated and likely cannot maintain anticoagulation after cardioversion as well at this time.  Hypokalemia, hypomagnesemia  --Potassium 2.8.  Replete with goal 4.0. --Magnesium 1.7.  Replete with goal 2.0.  Anemia with history of GI bleed --Current hemoglobin 8.2 with RBC 3.35.  Recommend transfusion for hemoglobin below 7. Consider third spacing due to anemia.     For questions or updates, please contact [place] Please consult [email or web address] for contact info under     Signed, Arvil Chaco, PA-C  06/23/2019 8:58 AM

## 2019-06-24 LAB — HEMOGLOBIN A1C
Hgb A1c MFr Bld: 4.9 % (ref 4.8–5.6)
Mean Plasma Glucose: 93.93 mg/dL

## 2019-06-24 LAB — CBC WITH DIFFERENTIAL/PLATELET
Abs Immature Granulocytes: 0.06 10*3/uL (ref 0.00–0.07)
Basophils Absolute: 0.1 10*3/uL (ref 0.0–0.1)
Basophils Relative: 1 %
Eosinophils Absolute: 0.2 10*3/uL (ref 0.0–0.5)
Eosinophils Relative: 2 %
HCT: 28.1 % — ABNORMAL LOW (ref 36.0–46.0)
Hemoglobin: 8.9 g/dL — ABNORMAL LOW (ref 12.0–15.0)
Immature Granulocytes: 1 %
Lymphocytes Relative: 15 %
Lymphs Abs: 1.6 10*3/uL (ref 0.7–4.0)
MCH: 24.9 pg — ABNORMAL LOW (ref 26.0–34.0)
MCHC: 31.7 g/dL (ref 30.0–36.0)
MCV: 78.5 fL — ABNORMAL LOW (ref 80.0–100.0)
Monocytes Absolute: 1.3 10*3/uL — ABNORMAL HIGH (ref 0.1–1.0)
Monocytes Relative: 12 %
Neutro Abs: 7.7 10*3/uL (ref 1.7–7.7)
Neutrophils Relative %: 69 %
Platelets: 180 10*3/uL (ref 150–400)
RBC: 3.58 MIL/uL — ABNORMAL LOW (ref 3.87–5.11)
RDW: 19.3 % — ABNORMAL HIGH (ref 11.5–15.5)
WBC: 11 10*3/uL — ABNORMAL HIGH (ref 4.0–10.5)
nRBC: 0 % (ref 0.0–0.2)

## 2019-06-24 LAB — COMPREHENSIVE METABOLIC PANEL
ALT: 15 U/L (ref 0–44)
AST: 38 U/L (ref 15–41)
Albumin: 2.5 g/dL — ABNORMAL LOW (ref 3.5–5.0)
Alkaline Phosphatase: 367 U/L — ABNORMAL HIGH (ref 38–126)
Anion gap: 10 (ref 5–15)
BUN: <5 mg/dL — ABNORMAL LOW (ref 6–20)
CO2: 21 mmol/L — ABNORMAL LOW (ref 22–32)
Calcium: 8 mg/dL — ABNORMAL LOW (ref 8.9–10.3)
Chloride: 102 mmol/L (ref 98–111)
Creatinine, Ser: 0.5 mg/dL (ref 0.44–1.00)
GFR calc Af Amer: >60 mL/min (ref >60)
GFR calc non Af Amer: >60 mL/min (ref >60)
Glucose, Bld: 104 mg/dL — ABNORMAL HIGH (ref 70–99)
Potassium: 4.1 mmol/L (ref 3.5–5.1)
Sodium: 133 mmol/L — ABNORMAL LOW (ref 135–145)
Total Bilirubin: 1.4 mg/dL — ABNORMAL HIGH (ref 0.3–1.2)
Total Protein: 4.6 g/dL — ABNORMAL LOW (ref 6.5–8.1)

## 2019-06-24 LAB — PROTEIN, BODY FLUID (OTHER): Total Protein, Body Fluid Other: 1 g/dL

## 2019-06-24 LAB — URINE CULTURE
Culture: NO GROWTH
Special Requests: NORMAL

## 2019-06-24 LAB — PROCALCITONIN: Procalcitonin: 0.26 ng/mL

## 2019-06-24 LAB — MAGNESIUM: Magnesium: 1.7 mg/dL (ref 1.7–2.4)

## 2019-06-24 LAB — DIGOXIN LEVEL: Digoxin Level: 1.3 ng/mL (ref 0.8–2.0)

## 2019-06-24 LAB — PHOSPHORUS: Phosphorus: 2 mg/dL — ABNORMAL LOW (ref 2.5–4.6)

## 2019-06-24 MED ORDER — SULFAMETHOXAZOLE-TRIMETHOPRIM 800-160 MG PO TABS
1.0000 | ORAL_TABLET | Freq: Every day | ORAL | Status: DC
  Administered 2019-06-24 – 2019-06-28 (×5): 1 via ORAL
  Filled 2019-06-24 (×5): qty 1

## 2019-06-24 MED ORDER — BACID PO TABS
2.0000 | ORAL_TABLET | Freq: Three times a day (TID) | ORAL | Status: DC
  Filled 2019-06-24 (×2): qty 2

## 2019-06-24 MED ORDER — ENSURE ENLIVE PO LIQD
237.0000 mL | Freq: Two times a day (BID) | ORAL | Status: DC
  Administered 2019-06-25 – 2019-06-28 (×7): 237 mL via ORAL

## 2019-06-24 MED ORDER — FLORANEX PO PACK
1.0000 g | PACK | Freq: Three times a day (TID) | ORAL | Status: DC
  Administered 2019-06-25 – 2019-06-28 (×9): 1 g via ORAL
  Filled 2019-06-24 (×17): qty 1

## 2019-06-24 MED ORDER — VANCOMYCIN 50 MG/ML ORAL SOLUTION
125.0000 mg | Freq: Every day | ORAL | Status: DC
  Administered 2019-06-24 – 2019-06-25 (×2): 125 mg via ORAL
  Filled 2019-06-24 (×2): qty 2.5

## 2019-06-24 MED ORDER — SULFAMETHOXAZOLE-TRIMETHOPRIM 400-80 MG PO TABS: 1.0000 | ORAL_TABLET | Freq: Every day | ORAL | Status: DC

## 2019-06-24 MED ORDER — MIDODRINE HCL 5 MG PO TABS
5.0000 mg | ORAL_TABLET | Freq: Three times a day (TID) | ORAL | Status: DC
  Administered 2019-06-24 – 2019-06-28 (×15): 5 mg via ORAL
  Filled 2019-06-24 (×15): qty 1

## 2019-06-24 MED ORDER — AMIODARONE HCL 200 MG PO TABS
200.0000 mg | ORAL_TABLET | Freq: Two times a day (BID) | ORAL | Status: DC
  Administered 2019-06-24 (×2): 200 mg via ORAL
  Filled 2019-06-24 (×2): qty 1

## 2019-06-24 MED ORDER — DIGOXIN 125 MCG PO TABS
0.1250 mg | ORAL_TABLET | Freq: Every day | ORAL | Status: DC
  Administered 2019-06-24 – 2019-06-25 (×2): 0.125 mg via ORAL
  Filled 2019-06-24 (×2): qty 1

## 2019-06-24 MED ORDER — ADULT MULTIVITAMIN W/MINERALS CH
1.0000 | ORAL_TABLET | Freq: Every day | ORAL | Status: DC
  Administered 2019-06-25 – 2019-06-28 (×4): 1 via ORAL
  Filled 2019-06-24 (×4): qty 1

## 2019-06-24 NOTE — Consult Note (Signed)
PHARMACY CONSULT NOTE - FOLLOW UP  Pharmacy Consult for Electrolyte Monitoring and Replacement   Recent Labs: Potassium (mmol/L)  Date Value  06/24/2019 4.1   Magnesium (mg/dL)  Date Value  06/24/2019 1.7   Calcium (mg/dL)  Date Value  06/24/2019 8.0 (L)   Albumin (g/dL)  Date Value  06/24/2019 2.5 (L)   Phosphorus (mg/dL)  Date Value  06/24/2019 2.0 (L)   Sodium (mmol/L)  Date Value  06/24/2019 133 (L)     Assessment: Pharmacy was consulted for electrolyte management.   Goal of Therapy:  Electrolytes WNL  Plan:  No additional replacement at this time. Will recheck w/ am labs.  Olivia Canter [facility] Clinical Pharmacist 06/24/2019 7:57 AM

## 2019-06-24 NOTE — Progress Notes (Signed)
Patient ID: Anita Reed, female   DOB: March 15, 1961, 58 y.o.   MRN: [id number]  [facility] PROGRESS NOTE  Anita Reed [id number] DOB: [birth year] DOA: 06/22/2019 PCP: Hubbard Hartshorn, FNP  HPI/Subjective: Patient feels better and wants to go home.  Heart rate is still fast and blood pressure still low.  Patient had an episode of diarrhea this morning.  Objective: Vitals:   06/24/19 1100 06/24/19 1200  BP:  97/62  Pulse: (!) 127 (!) 128  Resp: (!) 21 15  Temp:    SpO2: 93% 95%    Intake/Output Summary (Last 24 hours) at 06/24/2019 1316 Last data filed at 06/23/2019 2200 Gross per 24 hour  Intake 441.11 ml  Output -  Net 441.11 ml   Filed Weights   06/22/19 1530 06/22/19 2029  Weight: 74.8 kg 76.2 kg    ROS: Review of Systems  Constitutional: Negative for chills and fever.  Eyes: Negative for blurred vision.  Respiratory: Negative for cough and shortness of breath.   Cardiovascular: Negative for chest pain.  Gastrointestinal: Positive for abdominal pain and diarrhea. Negative for constipation, nausea and vomiting.  Genitourinary: Negative for dysuria.  Musculoskeletal: Negative for joint pain.  Neurological: Negative for dizziness and headaches.   Exam: Physical Exam  Constitutional: She is oriented to person, place, and time.  HENT:  Nose: No mucosal edema.  Mouth/Throat: No oropharyngeal exudate or posterior oropharyngeal edema.  Eyes: Pupils are equal, round, and reactive to light. Conjunctivae, EOM and lids are normal.  Neck: No JVD present. Carotid bruit is not present. No edema present. No thyroid mass and no thyromegaly present.  Cardiovascular: S1 normal and S2 normal. Exam reveals no gallop.  No murmur heard. Respiratory: No respiratory distress. She has decreased breath sounds in the right lower field and the left lower field. She has no wheezes. She has no rhonchi. She has no rales.  GI: Soft. Bowel sounds are normal. She exhibits distension.  There is abdominal tenderness.  Musculoskeletal:     Right ankle: She exhibits swelling.     Left ankle: She exhibits swelling.  Lymphadenopathy:    She has no cervical adenopathy.  Neurological: She is alert and oriented to person, place, and time. No cranial nerve deficit.  Skin: Skin is warm. No rash noted. Nails show no clubbing.  Psychiatric: She has a normal mood and affect.      Data Reviewed: Basic Metabolic Panel: Recent Labs  Lab 06/22/19 1332 06/22/19 1600 06/23/19 0204 06/23/19 0522 06/23/19 1831 06/23/19 2306 06/24/19 0511  NA 133* 131*  --  132*  --   --  133*  K 2.6* 2.5* 2.8* 2.8* 3.6 3.6 4.1  CL 94* 93*  --  97*  --   --  102  CO2 25 25  --  24  --   --  21*  GLUCOSE 123* 117*  --  102*  --   --  104*  BUN 8 8  --  6  --   --  <5*  CREATININE 0.68 0.62  --  0.56  --   --  0.50  CALCIUM 8.4* 8.2*  --  7.8*  --   --  8.0*  MG  --  1.5*  --  1.7  --  1.7 1.7  PHOS  --   --   --  2.7  --  2.0* 2.0*   Liver Function Tests: Recent Labs  Lab 06/22/19 1332 06/22/19 1600 06/23/19 0522 06/24/19 0511  AST 44*  45* 36 38  ALT 20 19 17 15   ALKPHOS 458* 438* 385* 367*  BILITOT 2.2* 2.1* 1.3* 1.4*  PROT 5.7* 5.5* 4.9* 4.6*  ALBUMIN 3.0* 2.9* 2.7* 2.5*   CBC: Recent Labs  Lab 06/22/19 1332 06/22/19 1600 06/23/19 0522 06/24/19 0511  WBC 13.3* 12.3* 10.7* 11.0*  NEUTROABS 9.9* 9.5*  --  7.7  HGB 10.4* 10.4* 8.2* 8.9*  HCT 33.0* 33.2* 26.5* 28.1*  MCV 79.5* 79.0* 79.1* 78.5*  PLT 223 222 177 180   BNP (last 3 results) Recent Labs    01/18/19 1220  BNP 771.0*    CBG: Recent Labs  Lab 06/23/19 0335  GLUCAP 125*    Recent Results (from the past 240 hour(s))  Blood culture (routine x 2)     Status: None (Preliminary result)   Collection Time: 06/22/19  4:20 PM   Specimen: BLOOD  Result Value Ref Range Status   Specimen Description BLOOD BLOOD RIGHT ARM  Final   Special Requests   Final    BOTTLES DRAWN AEROBIC AND ANAEROBIC Blood Culture  adequate volume   Culture   Final    NO GROWTH 2 DAYS Performed at [facility], [address] [place], [place] [address]    Report Status PENDING  Incomplete  Blood culture (routine x 2)     Status: None (Preliminary result)   Collection Time: 06/22/19  4:20 PM   Specimen: BLOOD  Result Value Ref Range Status   Specimen Description BLOOD BLOOD LEFT HAND  Final   Special Requests   Final    BOTTLES DRAWN AEROBIC AND ANAEROBIC Blood Culture adequate volume   Culture   Final    NO GROWTH 2 DAYS Performed at [facility], [address], [place],  [address]    Report Status PENDING  Incomplete  SARS Coronavirus 2 by RT PCR (hospital order, performed in [facility] hospital lab) Nasopharyngeal Nasopharyngeal Swab     Status: None   Collection Time: 06/22/19  4:40 PM   Specimen: Nasopharyngeal Swab  Result Value Ref Range Status   SARS Coronavirus 2 NEGATIVE NEGATIVE Final    Comment: (NOTE) If result is NEGATIVE SARS-CoV-2 target nucleic acids are NOT DETECTED. The SARS-CoV-2 RNA is generally detectable in upper and lower  respiratory specimens during the acute phase of infection. The lowest  concentration of SARS-CoV-2 viral copies this assay can detect is 250  copies / mL. A negative result does not preclude SARS-CoV-2 infection  and should not be used as the sole basis for treatment or other  patient management decisions.  A negative result may occur with  improper specimen collection / handling, submission of specimen other  than nasopharyngeal swab, presence of viral mutation(s) within the  areas targeted by this assay, and inadequate number of viral copies  (<250 copies / mL). A negative result must be combined with clinical  observations, patient history, and epidemiological information. If result is POSITIVE SARS-CoV-2 target nucleic acids are DETECTED. The SARS-CoV-2 RNA is generally detectable in upper and lower  respiratory specimens  dur ing the acute phase of infection.  Positive  results are indicative of active infection with SARS-CoV-2.  Clinical  correlation with patient history and other diagnostic information is  necessary to determine patient infection status.  Positive results do  not rule out bacterial infection or co-infection with other viruses. If result is PRESUMPTIVE POSTIVE SARS-CoV-2 nucleic acids MAY BE PRESENT.   A presumptive positive result was obtained on the  submitted specimen  and confirmed on repeat testing.  While 2019 novel coronavirus  (SARS-CoV-2) nucleic acids may be present in the submitted sample  additional confirmatory testing may be necessary for epidemiological  and / or clinical management purposes  to differentiate between  SARS-CoV-2 and other Sarbecovirus currently known to infect humans.  If clinically indicated additional testing with an alternate test  methodology [phone]) is advised. The SARS-CoV-2 RNA is generally  detectable in upper and lower respiratory sp ecimens during the acute  phase of infection. The expected result is Negative. Fact Sheet for Patients:  StrictlyIdeas.no Fact Sheet for Healthcare Providers: BankingDealers.co.za This test is not yet approved or cleared by the [place] FDA and has been authorized for detection and/or diagnosis of SARS-CoV-2 by FDA under an Emergency Use Authorization (EUA).  This EUA will remain in effect (meaning this test can be used) for the duration of the COVID-19 declaration under Section 564(b)(1) of the Act, 21 U.S.C. section 360bbb-3(b)(1), unless the authorization is terminated or revoked sooner. Performed at [facility], [address], [place], [place] [address]   Urine Culture     Status: None   Collection Time: 06/22/19  6:09 PM   Specimen: Urine, Random  Result Value Ref Range Status   Specimen Description   Final    URINE, RANDOM Performed at  [facility], [address], [place], [place] [address]    Special Requests   Final    Normal Performed at [facility], [address], [place], [place] [address]    Culture   Final    NO GROWTH Performed at [facility], [place] [address], [place], [place] [address]    Report Status 06/24/2019 FINAL  Final  Body fluid culture ( includes gram stain)     Status: None (Preliminary result)   Collection Time: 06/22/19  6:44 PM   Specimen: Peritoneal Cavity; Peritoneal Fluid  Result Value Ref Range Status   Specimen Description   Final    PERITONEAL CAVITY Performed at [facility], [address], [place], [place] [address]    Special Requests   Final    Normal Performed at [facility], [address] [place], [place] [address]    Gram Stain   Final    WBC PRESENT,BOTH PMN AND MONONUCLEAR NO ORGANISMS SEEN CYTOSPIN SMEAR    Culture   Final    NO GROWTH 2 DAYS Performed at [facility], [place] [address] [place], [place] [address]    Report Status PENDING  Incomplete  MRSA PCR Screening     Status: None   Collection Time: 06/23/19  4:22 AM   Specimen: Nasopharyngeal  Result Value Ref Range Status   MRSA by PCR NEGATIVE NEGATIVE Final    Comment:        The GeneXpert MRSA Assay (FDA approved for NASAL specimens only), is one component of a comprehensive MRSA colonization surveillance program. It is not intended to diagnose MRSA infection nor to guide or monitor treatment for MRSA infections. Performed at [facility], [address] [place], [place] [address]   Body fluid culture with gram stain     Status: None (Preliminary result)   Collection Time: 06/23/19  1:46 PM   Specimen: Peritoneal Washings; Body Fluid  Result Value Ref Range Status   Specimen Description   Final    PERITONEAL FLUID Performed at [facility], [address] [place], [place] [address]  Special Requests   Final     NONE Performed at [facility], [address] [place], [place] [address]    Gram Stain   Final    RARE WBC PRESENT, PREDOMINANTLY MONONUCLEAR NO ORGANISMS SEEN Performed at [facility], [place] [address] [place], [place] [address]    Culture PENDING  Incomplete   Report Status PENDING  Incomplete     Studies: Ct Abdomen Pelvis W Contrast  Result Date: 06/23/2019 CLINICAL DATA:  58 year old female with abdominal pain. EXAM: CT ABDOMEN AND PELVIS WITH CONTRAST TECHNIQUE: Multidetector CT imaging of the abdomen and pelvis was performed using the standard protocol following bolus administration of intravenous contrast. CONTRAST:  162mL OMNIPAQUE IOHEXOL 300 MG/ML  SOLN COMPARISON:  CT of the abdomen pelvis dated 06/04/2019 FINDINGS: Lower chest: There is a small right pleural effusion with right lung base atelectasis or infiltrate. Clinical correlation is recommended. There is eventration of the right hemidiaphragm similar to prior CT. Coronary vascular calcifications noted. No intra-abdominal free air. Moderate ascites, increased since the prior CT. Hepatobiliary: Cirrhosis. No intrahepatic biliary ductal dilatation. No calcified gallstone. Pancreas: Unremarkable. No pancreatic ductal dilatation or surrounding inflammatory changes. Spleen: Normal in size without focal abnormality. Adrenals/Urinary Tract: The adrenal glands are unremarkable. There is no hydronephrosis on either side. There is symmetric enhancement and excretion of contrast by both kidneys. The urinary bladder is unremarkable. Stomach/Bowel: There is loose stool throughout the colon. There is a small hiatal hernia. No bowel obstruction. Appendectomy. Vascular/Lymphatic: Advanced aortoiliac atherosclerotic disease. The IVC is unremarkable. The SMV, splenic vein, and main portal vein are patent. No portal venous gas. Paraesophageal and gastric varices noted. There is recanalization of the paraumbilical vein. Reproductive:  Hysterectomy. Other: Diffuse subcutaneous edema and anasarca, increased since the prior CT. Musculoskeletal: No acute or significant osseous findings. IMPRESSION: 1. Small right pleural effusion with right lung base atelectasis or infiltrate. Clinical correlation is recommended. 2. Decompensated cirrhosis. 3. Moderate ascites, increased since the prior CT. 4. No bowel obstruction. Aortic Atherosclerosis (ICD10-I70.0). Electronically Signed   By: Anner Crete M.D.   On: 06/23/2019 02:09   US Venous Img Lower Bilateral  Result Date: 06/23/2019 CLINICAL DATA:  Bilateral lower extremity edema EXAM: BILATERAL LOWER EXTREMITY VENOUS DOPPLER ULTRASOUND TECHNIQUE: Gray-scale sonography with graded compression, as well as color Doppler and duplex ultrasound were performed to evaluate the lower extremity deep venous systems from the level of the common femoral vein and including the common femoral, femoral, profunda femoral, popliteal and calf veins including the posterior tibial, peroneal and gastrocnemius veins when visible. The superficial great saphenous vein was also interrogated. Spectral Doppler was utilized to evaluate flow at rest and with distal augmentation maneuvers in the common femoral, femoral and popliteal veins. COMPARISON:  None. FINDINGS: RIGHT LOWER EXTREMITY Common Femoral Vein: No evidence of thrombus. Normal compressibility, respiratory phasicity and response to augmentation. Saphenofemoral Junction: No evidence of thrombus. Normal compressibility and flow on color Doppler imaging. Profunda Femoral Vein: No evidence of thrombus. Normal compressibility and flow on color Doppler imaging. Femoral Vein: No evidence of thrombus. Normal compressibility, respiratory phasicity and response to augmentation. Popliteal Vein: No evidence of thrombus. Normal compressibility, respiratory phasicity and response to augmentation. Calf Veins: No evidence of thrombus. Normal compressibility and flow on color  Doppler imaging. Other Findings:  Peripheral subcutaneous edema noted LEFT LOWER EXTREMITY Common Femoral Vein: No evidence of thrombus. Normal compressibility, respiratory phasicity and response to augmentation. Saphenofemoral Junction: No evidence of thrombus. Normal compressibility and  flow on color Doppler imaging. Profunda Femoral Vein: No evidence of thrombus. Normal compressibility and flow on color Doppler imaging. Femoral Vein: No evidence of thrombus. Normal compressibility, respiratory phasicity and response to augmentation. Popliteal Vein: No evidence of thrombus. Normal compressibility, respiratory phasicity and response to augmentation. Calf Veins: No evidence of thrombus. Normal compressibility and flow on color Doppler imaging. Other Findings:  Peripheral edema noted IMPRESSION: No evidence of deep venous thrombosis in either lower extremity. Electronically Signed   By: Jerilynn Mages.  Shick M.D.   On: 06/23/2019 11:07   Dg Chest Port 1 View  Result Date: 06/23/2019 CLINICAL DATA:  58 year old female with sepsis. EXAM: PORTABLE CHEST 1 VIEW COMPARISON:  Chest radiograph dated 06/03/2019 FINDINGS: There is mild eventration of the right hemidiaphragm. There is a small right pleural effusion with right lung base atelectasis or infiltrate. An area of hazy density in the right apical subpleural region appears similar to prior radiograph and may represent scarring or developing infiltrate. Clinical correlation is recommended. Or the left lung remains clear. No pneumothorax. Stable cardiac silhouette. No acute osseous pathology. IMPRESSION: 1. Small right pleural effusion with right lung base atelectasis or infiltrate. 2. Stable area of hazy density in the right apical subpleural region. Clinical correlation is recommended to exclude developing infiltrate. Electronically Signed   By: Anner Crete M.D.   On: 06/23/2019 03:28    Scheduled Meds: . amiodarone  200 mg Oral BID  . Chlorhexidine Gluconate Cloth   6 each Topical Daily  . digoxin  0.125 mg Oral Daily  . midodrine  5 mg Oral TID WC  . pantoprazole  40 mg Oral BID  . sulfamethoxazole-trimethoprim  1 tablet Oral Daily  . vancomycin  125 mg Oral Daily   Continuous Infusions:   Assessment/Plan:  1. Clinical sepsis, present on admission.  SBP ruled out.  Case discussed with critical care specialist and he thinks this is atelectasis and not pneumonia.  Discontinue antibiotics.  2. Cirrhosis of the liver with ascites that is recurrent.  Mortality is very high within 2 years of developing ascites.  Hopefully can go back on Lasix and Aldactone soon.  Blood pressure is the limiting factor at this point.  Paracentesis yesterday.  Gastroenterology recommended Bactrim DS daily to prevent SBP.  We will also give daily p.o. vancomycin and probiotic.  Patient has been asking about a transplant.  We will have to follow-up with GI as outpatient and show no alcohol within a year. 3. Hypotension.  Patient states that her blood pressure has been low for a while. 4. Atrial fibrillation with rapid ventricular response.  [facility] cardiology consultation.  Patient loaded with digoxin.  Will start amiodarone.  Not a candidate for anticoagulation currently. 5. Hypokalemia and hypomagnesemia.  Replaced 6. Hepatitis C.  As per husband this was treated.  Code Status:     Code Status Orders  (From admission, onward)         Start     Ordered   06/22/19 2023  Do not attempt resuscitation (DNR)  Continuous    Question Answer Comment  In the event of cardiac or respiratory ARREST Do not call a "code blue"   In the event of cardiac or respiratory ARREST Do not perform Intubation, CPR, defibrillation or ACLS   In the event of cardiac or respiratory ARREST Use medication by any route, position, wound care, and other measures to relive pain and suffering. May use oxygen, suction and manual treatment of airway obstruction as needed for  comfort.   Comments RN may  pronounce      06/22/19 2023        Code Status History    Date Active Date Inactive Code Status Order ID Comments User Context   06/01/2019 2148 06/11/2019 2150 Full Code [id number]  Bettey Costa, MD Inpatient   01/28/2019 1920 01/31/2019 1827 Full Code [id number]  Otila Back, MD ED   01/18/2019 1651 01/19/2019 1946 DNR [id number]  Otila Back, MD ED   Advance Care Planning Activity     Family Communication: Spoke with husband on the phone Disposition Plan: To be determined  Consultants:  Critical care specialist  Cardiology  Gastroenterology  Antibiotics:  Rocephin  Flagyl  Time spent: 30 minutes, case discussed with critical care specialist.  [facility]

## 2019-06-24 NOTE — Progress Notes (Signed)
Initial Nutrition Assessment  DOCUMENTATION CODES:   Not applicable  INTERVENTION:  Provide Ensure Enlive po BID, each supplement provides 350 kcal and 20 grams of protein.  Provide daily MVI.  NUTRITION DIAGNOSIS:   Increased nutrient needs related to catabolic illness(cirrhosis) as evidenced by estimated needs.  GOAL:   Patient will meet greater than or equal to 90% of their needs  MONITOR:   PO intake, Supplement acceptance, Labs, Weight trends, I & O's, Skin  REASON FOR ASSESSMENT:   Malnutrition Screening Tool, Consult Assessment of nutrition requirement/status  ASSESSMENT:   58 year old female with PMHx of HTN, hepatitis C, cirrhosis, anemia admitted with sepsis, recurrent ascites in setting of cirrhosis, A-fib with RVR.   -Patient s/p paracentesis with 3 L removed by Dr. Lanney Gins on 10/24.  Patient is on enteric precautions. Attempted to call patient over the phone multiple times throughout the day and each time she would answer but then would not say anything and hang up. Per chart patient was eating 0% of her meals yesterday. No meal documentation filled out yet for today so unsure how she is doing. She has increased calorie/protein needs and would benefit from oral nutrition supplements. She is at risk for malnutrition but unable to determine if she meets criteria without full nutrition/weight history or NFPE.  Medications reviewed and include: amiodarone, lactobacillus, pantoprazole, vancomycin.  Labs reviewed: Sodium 133, CO2 21, BUN <5, Phosphorus 2.  NUTRITION - FOCUSED PHYSICAL EXAM:  Unable to complete at this time.  Diet Order:   Diet Order            Diet Heart Room service appropriate? Yes; Fluid consistency: Thin  Diet effective now             EDUCATION NEEDS:   No education needs have been identified at this time  Skin:  Skin Assessment: Skin Integrity Issues:(closed incision right flank)  Last BM:  06/23/2019 per chart  Height:    Ht Readings from Last 1 Encounters:  06/24/19 5' 8.5" (1.74 m)   Weight:   Wt Readings from Last 1 Encounters:  06/24/19 76.1 kg   Ideal Body Weight:  64.8 kg  BMI:  Body mass index is 25.14 kg/m.  Estimated Nutritional Needs:   Kcal:  2000-2200  Protein:  100-110 grams  Fluid:  1.8 L/day  Willey Blade, MS, RD, LDN Office: [phone] Pager: [phone] After Hours/Weekend Pager: [phone]

## 2019-06-24 NOTE — Progress Notes (Signed)
Progress Note  Patient Name: Anita Reed Date of Encounter: 06/24/2019  Primary Cardiologist: No primary care provider on file.   Subjective   Abdomen feels much better after paracentesis.  No other acute events over the past 24 hours  Inpatient Medications    Scheduled Meds:  Chlorhexidine Gluconate Cloth  6 each Topical Daily   digoxin  0.125 mg Oral Daily   midodrine  5 mg Oral TID WC   pantoprazole  40 mg Oral BID   Continuous Infusions:  cefTRIAXone (ROCEPHIN)  IV 2 g (06/24/19 0752)   metronidazole 500 mg (06/24/19 0424)   PRN Meds: baclofen, HYDROmorphone (DILAUDID) injection, ondansetron **OR** ondansetron (ZOFRAN) IV   Vital Signs    Vitals:   06/24/19 0347 06/24/19 0400 06/24/19 0755 06/24/19 0800  BP: (!) 81/61 90/63 (!) 71/55 107/67  Pulse:   (!) 124 (!) 118  Resp: 14 18 15 16   Temp:    98.7 F (37.1 C)  TempSrc:    Oral  SpO2:   96% 93%  Weight:      Height:        Intake/Output Summary (Last 24 hours) at 06/24/2019 1047 Last data filed at 06/23/2019 2200 Gross per 24 hour  Intake 441.11 ml  Output --  Net 441.11 ml   Last 3 Weights 06/22/2019 06/22/2019 06/22/2019  Weight (lbs) 168 lb 165 lb 165 lb 3.2 oz  Weight (kg) 76.204 kg 74.844 kg 74.934 kg      Telemetry    Atrial flutter heart rate 120s.- Personally Reviewed  ECG    None obtained today.  Physical Exam   GEN: No acute distress.   Neck: No JVD Cardiac:  Tachycardic, no murmurs, rubs, or gallops.  Respiratory: Clear to auscultation bilaterally. GI: Soft, mild discomfort, distended MS:  2+ edema; No deformity. Neuro:  Nonfocal  Psych: Normal affect   Labs    High Sensitivity Troponin:   Recent Labs  Lab 06/01/19 1251 06/01/19 1926  TROPONINIHS 12 15      Chemistry Recent Labs  Lab 06/22/19 1600  06/23/19 0522 06/23/19 1831 06/23/19 2306 06/24/19 0511  NA 131*  --  132*  --   --  133*  K 2.5*   < > 2.8* 3.6 3.6 4.1  CL 93*  --  97*  --   --  102   CO2 25  --  24  --   --  21*  GLUCOSE 117*  --  102*  --   --  104*  BUN 8  --  6  --   --  <5*  CREATININE 0.62  --  0.56  --   --  0.50  CALCIUM 8.2*  --  7.8*  --   --  8.0*  PROT 5.5*  --  4.9*  --   --  4.6*  ALBUMIN 2.9*  --  2.7*  --   --  2.5*  AST 45*  --  36  --   --  38  ALT 19  --  17  --   --  15  ALKPHOS 438*  --  385*  --   --  367*  BILITOT 2.1*  --  1.3*  --   --  1.4*  GFRNONAA >60  --  >60  --   --  >60  GFRAA >60  --  >60  --   --  >60  ANIONGAP 13  --  11  --   --  10   < > =  values in this interval not displayed.     Hematology Recent Labs  Lab 06/22/19 1600 06/23/19 0522 06/24/19 0511  WBC 12.3* 10.7* 11.0*  RBC 4.20 3.35* 3.58*  HGB 10.4* 8.2* 8.9*  HCT 33.2* 26.5* 28.1*  MCV 79.0* 79.1* 78.5*  MCH 24.8* 24.5* 24.9*  MCHC 31.3 30.9 31.7  RDW 19.1* 19.0* 19.3*  PLT 222 177 180    BNPNo results for input(s): BNP, PROBNP in the last 168 hours.   DDimer No results for input(s): DDIMER in the last 168 hours.   Radiology    Ct Abdomen Pelvis W Contrast  Result Date: 06/23/2019 CLINICAL DATA:  58 year old female with abdominal pain. EXAM: CT ABDOMEN AND PELVIS WITH CONTRAST TECHNIQUE: Multidetector CT imaging of the abdomen and pelvis was performed using the standard protocol following bolus administration of intravenous contrast. CONTRAST:  144mL OMNIPAQUE IOHEXOL 300 MG/ML  SOLN COMPARISON:  CT of the abdomen pelvis dated 06/04/2019 FINDINGS: Lower chest: There is a small right pleural effusion with right lung base atelectasis or infiltrate. Clinical correlation is recommended. There is eventration of the right hemidiaphragm similar to prior CT. Coronary vascular calcifications noted. No intra-abdominal free air. Moderate ascites, increased since the prior CT. Hepatobiliary: Cirrhosis. No intrahepatic biliary ductal dilatation. No calcified gallstone. Pancreas: Unremarkable. No pancreatic ductal dilatation or surrounding inflammatory changes. Spleen:  Normal in size without focal abnormality. Adrenals/Urinary Tract: The adrenal glands are unremarkable. There is no hydronephrosis on either side. There is symmetric enhancement and excretion of contrast by both kidneys. The urinary bladder is unremarkable. Stomach/Bowel: There is loose stool throughout the colon. There is a small hiatal hernia. No bowel obstruction. Appendectomy. Vascular/Lymphatic: Advanced aortoiliac atherosclerotic disease. The IVC is unremarkable. The SMV, splenic vein, and main portal vein are patent. No portal venous gas. Paraesophageal and gastric varices noted. There is recanalization of the paraumbilical vein. Reproductive: Hysterectomy. Other: Diffuse subcutaneous edema and anasarca, increased since the prior CT. Musculoskeletal: No acute or significant osseous findings. IMPRESSION: 1. Small right pleural effusion with right lung base atelectasis or infiltrate. Clinical correlation is recommended. 2. Decompensated cirrhosis. 3. Moderate ascites, increased since the prior CT. 4. No bowel obstruction. Aortic Atherosclerosis (ICD10-I70.0). Electronically Signed   By: Anner Crete M.D.   On: 06/23/2019 02:09   US Venous Img Lower Bilateral  Result Date: 06/23/2019 CLINICAL DATA:  Bilateral lower extremity edema EXAM: BILATERAL LOWER EXTREMITY VENOUS DOPPLER ULTRASOUND TECHNIQUE: Gray-scale sonography with graded compression, as well as color Doppler and duplex ultrasound were performed to evaluate the lower extremity deep venous systems from the level of the common femoral vein and including the common femoral, femoral, profunda femoral, popliteal and calf veins including the posterior tibial, peroneal and gastrocnemius veins when visible. The superficial great saphenous vein was also interrogated. Spectral Doppler was utilized to evaluate flow at rest and with distal augmentation maneuvers in the common femoral, femoral and popliteal veins. COMPARISON:  None. FINDINGS: RIGHT LOWER  EXTREMITY Common Femoral Vein: No evidence of thrombus. Normal compressibility, respiratory phasicity and response to augmentation. Saphenofemoral Junction: No evidence of thrombus. Normal compressibility and flow on color Doppler imaging. Profunda Femoral Vein: No evidence of thrombus. Normal compressibility and flow on color Doppler imaging. Femoral Vein: No evidence of thrombus. Normal compressibility, respiratory phasicity and response to augmentation. Popliteal Vein: No evidence of thrombus. Normal compressibility, respiratory phasicity and response to augmentation. Calf Veins: No evidence of thrombus. Normal compressibility and flow on color Doppler imaging. Other Findings:  Peripheral subcutaneous edema noted LEFT  LOWER EXTREMITY Common Femoral Vein: No evidence of thrombus. Normal compressibility, respiratory phasicity and response to augmentation. Saphenofemoral Junction: No evidence of thrombus. Normal compressibility and flow on color Doppler imaging. Profunda Femoral Vein: No evidence of thrombus. Normal compressibility and flow on color Doppler imaging. Femoral Vein: No evidence of thrombus. Normal compressibility, respiratory phasicity and response to augmentation. Popliteal Vein: No evidence of thrombus. Normal compressibility, respiratory phasicity and response to augmentation. Calf Veins: No evidence of thrombus. Normal compressibility and flow on color Doppler imaging. Other Findings:  Peripheral edema noted IMPRESSION: No evidence of deep venous thrombosis in either lower extremity. Electronically Signed   By: Jerilynn Mages.  Shick M.D.   On: 06/23/2019 11:07   Dg Chest Port 1 View  Result Date: 06/23/2019 CLINICAL DATA:  58 year old female with sepsis. EXAM: PORTABLE CHEST 1 VIEW COMPARISON:  Chest radiograph dated 06/03/2019 FINDINGS: There is mild eventration of the right hemidiaphragm. There is a small right pleural effusion with right lung base atelectasis or infiltrate. An area of hazy density in  the right apical subpleural region appears similar to prior radiograph and may represent scarring or developing infiltrate. Clinical correlation is recommended. Or the left lung remains clear. No pneumothorax. Stable cardiac silhouette. No acute osseous pathology. IMPRESSION: 1. Small right pleural effusion with right lung base atelectasis or infiltrate. 2. Stable area of hazy density in the right apical subpleural region. Clinical correlation is recommended to exclude developing infiltrate. Electronically Signed   By: Anner Crete M.D.   On: 06/23/2019 03:28    Cardiac Studies  Echocardiogram date 01/19/2019 1. The left ventricle has normal systolic function, with an ejection fraction of 55-60%. The cavity size was normal. Left ventricular diastolic function could not be evaluated. No evidence of left ventricular regional wall motion abnormalities.  2. The right ventricle has normal systolic function. The cavity was not assessed. There is no increase in right ventricular wall thickness. Right ventricular systolic pressure is mildly elevated with an estimated pressure of 35.0 mmHg.  3. Left atrial size was mildly dilated.  4. The mitral valve is degenerative. Mild thickening of the mitral valve leaflet. There is moderate mitral annular calcification present. No evidence of mitral valve stenosis.  5. The tricuspid valve is grossly normal. Tricuspid valve regurgitation is mild-moderate.  6. The aortic valve is tricuspid. Moderate thickening of the aortic valve. Mild calcification of the aortic valve.  7. The aortic root is normal in size and structure.  8. The inferior vena cava was dilated in size with <50% respiratory variability.  9. Underlying rhythm appears to be atrial flutter.  Patient Profile     58 y.o. female with history of alcohol induced cirrhosis, current smoker, esophageal varices, atrial flutter presents to the emergency department due to hypokalemia.  Being seen for atrial  flutter.  Assessment & Plan    Patient feels better after paracentesis yesterday.  Her heart rates have been difficult to control.  Her underlying hypotension and or infection could be contributing to her tachycardia.  She is in atrial flutter.  Medications usually used for heart rate control such as beta-blocker, diltiazem, amiodarone all decrease blood pressure and not the best option for her since her current systolic pressure is in the 80s.  She was loaded with digoxin yesterday, levels this morning are not elevated.  We will plan to continue with digoxin 0.125 mg daily for heart rate control.  Anticoagulation is obviously contraindicated due to history of blood loss.    Signed, Kate Sable,  MD  06/24/2019, 10:47 AM

## 2019-06-24 NOTE — Progress Notes (Signed)
Anita Darby, MD [address]  [place]  [place], [place] [address]  Main: [phone]  Fax: [phone] Pager: [phone]   Subjective: Reports feeling significantly better today and would like to go home.  She had paracentesis with 3 L removed by the ICU attending.  Patient informed me about leakage at paracentesis site from yesterday, dressing placed.  No evidence of sepsis identified.  Therefore, antibiotics were discontinued and SBP prophylaxis with Bactrim was started.  She is also on oral vancomycin.  Patient asked me about liver transplant evaluation.  Patient remains tachycardic, received digoxin   Objective: Vital signs in last 24 hours: Vitals:   06/24/19 1100 06/24/19 1200 06/24/19 1313 06/24/19 1500  BP:  97/62 101/72   Pulse: (!) 127 (!) 128 (!) 124   Resp: (!) 21 15 19    Temp:   98.6 F (37 C)   TempSrc:   Oral   SpO2: 93% 95% 99%   Weight:    76.1 kg  Height:    5' 8.5" (1.74 m)   Weight change:   Intake/Output Summary (Last 24 hours) at 06/24/2019 1520 Last data filed at 06/23/2019 2200 Gross per 24 hour  Intake 441.11 ml  Output --  Net 441.11 ml     Exam: Heart:: Tachycardia Lungs: normal and clear to auscultation Abdomen: Soft, mildly tender, mildly distended, dressing at the paracentesis site due to leakage of fluid   Lab Results: @LABTEST2 @ Micro Results: Recent Results (from the past 240 hour(s))  Blood culture (routine x 2)     Status: None (Preliminary result)   Collection Time: 06/22/19  4:20 PM   Specimen: BLOOD  Result Value Ref Range Status   Specimen Description BLOOD BLOOD RIGHT ARM  Final   Special Requests   Final    BOTTLES DRAWN AEROBIC AND ANAEROBIC Blood Culture adequate volume   Culture   Final    NO GROWTH 2 DAYS Performed at [facility], [address] [place], [place] [address]    Report Status PENDING  Incomplete  Blood culture (routine x 2)     Status: None (Preliminary result)    Collection Time: 06/22/19  4:20 PM   Specimen: BLOOD  Result Value Ref Range Status   Specimen Description BLOOD BLOOD LEFT HAND  Final   Special Requests   Final    BOTTLES DRAWN AEROBIC AND ANAEROBIC Blood Culture adequate volume   Culture   Final    NO GROWTH 2 DAYS Performed at [facility], [address] [place], [place] [address]    Report Status PENDING  Incomplete  SARS Coronavirus 2 by RT PCR (hospital order, performed in [facility] hospital lab) Nasopharyngeal Nasopharyngeal Swab     Status: None   Collection Time: 06/22/19  4:40 PM   Specimen: Nasopharyngeal Swab  Result Value Ref Range Status   SARS Coronavirus 2 NEGATIVE NEGATIVE Final    Comment: (NOTE) If result is NEGATIVE SARS-CoV-2 target nucleic acids are NOT DETECTED. The SARS-CoV-2 RNA is generally detectable in upper and lower  respiratory specimens during the acute phase of infection. The lowest  concentration of SARS-CoV-2 viral copies this assay can detect is 250  copies / mL. A negative result does not preclude SARS-CoV-2 infection  and should not be used as the sole basis for treatment or other  patient management decisions.  A negative result may occur with  improper specimen collection / handling, submission of specimen other  than nasopharyngeal swab,  presence of viral mutation(s) within the  areas targeted by this assay, and inadequate number of viral copies  (<250 copies / mL). A negative result must be combined with clinical  observations, patient history, and epidemiological information. If result is POSITIVE SARS-CoV-2 target nucleic acids are DETECTED. The SARS-CoV-2 RNA is generally detectable in upper and lower  respiratory specimens dur ing the acute phase of infection.  Positive  results are indicative of active infection with SARS-CoV-2.  Clinical  correlation with patient history and other diagnostic information is  necessary to determine patient infection status.  Positive  results do  not rule out bacterial infection or co-infection with other viruses. If result is PRESUMPTIVE POSTIVE SARS-CoV-2 nucleic acids MAY BE PRESENT.   A presumptive positive result was obtained on the submitted specimen  and confirmed on repeat testing.  While 2019 novel coronavirus  (SARS-CoV-2) nucleic acids may be present in the submitted sample  additional confirmatory testing may be necessary for epidemiological  and / or clinical management purposes  to differentiate between  SARS-CoV-2 and other Sarbecovirus currently known to infect humans.  If clinically indicated additional testing with an alternate test  methodology [phone]) is advised. The SARS-CoV-2 RNA is generally  detectable in upper and lower respiratory sp ecimens during the acute  phase of infection. The expected result is Negative. Fact Sheet for Patients:  StrictlyIdeas.no Fact Sheet for Healthcare Providers: BankingDealers.co.za This test is not yet approved or cleared by the [place] FDA and has been authorized for detection and/or diagnosis of SARS-CoV-2 by FDA under an Emergency Use Authorization (EUA).  This EUA will remain in effect (meaning this test can be used) for the duration of the COVID-19 declaration under Section 564(b)(1) of the Act, 21 U.S.C. section 360bbb-3(b)(1), unless the authorization is terminated or revoked sooner. Performed at [facility], [address], [place], [place] [address]   Urine Culture     Status: None   Collection Time: 06/22/19  6:09 PM   Specimen: Urine, Random  Result Value Ref Range Status   Specimen Description   Final    URINE, RANDOM Performed at [facility], [address], [place], [place] [address]    Special Requests   Final    Normal Performed at [facility], [address] [place], [place] [address]    Culture   Final    NO GROWTH Performed at [facility], [place] [address] [place], [place] [address]    Report Status 06/24/2019 FINAL  Final  Body fluid culture ( includes gram stain)     Status: None (Preliminary result)   Collection Time: 06/22/19  6:44 PM   Specimen: Peritoneal Cavity; Peritoneal Fluid  Result Value Ref Range Status   Specimen Description   Final    PERITONEAL CAVITY Performed at [facility], [address] [place], [place] [address]    Special Requests   Final    Normal Performed at [facility], [address] [place], [place] [address]    Gram Stain   Final    WBC PRESENT,BOTH PMN AND MONONUCLEAR NO ORGANISMS SEEN CYTOSPIN SMEAR    Culture   Final    NO GROWTH 2 DAYS Performed at [facility], [place] [address] [place], [place] [address]    Report Status PENDING  Incomplete  MRSA PCR Screening     Status: None   Collection Time: 06/23/19  4:22 AM   Specimen: Nasopharyngeal  Result  Value Ref Range Status   MRSA by PCR NEGATIVE NEGATIVE Final    Comment:        The GeneXpert MRSA Assay (FDA approved for NASAL specimens only), is one component of a comprehensive MRSA colonization surveillance program. It is not intended to diagnose MRSA infection nor to guide or monitor treatment for MRSA infections. Performed at [facility], [address] [place], [place] [address]   Body fluid culture with gram stain     Status: None (Preliminary result)   Collection Time: 06/23/19  1:46 PM   Specimen: Peritoneal Washings; Body Fluid  Result Value Ref Range Status   Specimen Description   Final    PERITONEAL FLUID Performed at [facility], [address] [place], [place] [address]    Special Requests   Final    NONE Performed at [facility], [address] [place], [place] [address]    Gram Stain   Final    RARE WBC PRESENT, PREDOMINANTLY MONONUCLEAR NO ORGANISMS SEEN Performed at [facility], [place] [address], [place], [place]  [address]    Culture PENDING  Incomplete   Report Status PENDING  Incomplete   Studies/Results: Ct Abdomen Pelvis W Contrast  Result Date: 06/23/2019 CLINICAL DATA:  58 year old female with abdominal pain. EXAM: CT ABDOMEN AND PELVIS WITH CONTRAST TECHNIQUE: Multidetector CT imaging of the abdomen and pelvis was performed using the standard protocol following bolus administration of intravenous contrast. CONTRAST:  164mL OMNIPAQUE IOHEXOL 300 MG/ML  SOLN COMPARISON:  CT of the abdomen pelvis dated 06/04/2019 FINDINGS: Lower chest: There is a small right pleural effusion with right lung base atelectasis or infiltrate. Clinical correlation is recommended. There is eventration of the right hemidiaphragm similar to prior CT. Coronary vascular calcifications noted. No intra-abdominal free air. Moderate ascites, increased since the prior CT. Hepatobiliary: Cirrhosis. No intrahepatic biliary ductal dilatation. No calcified gallstone. Pancreas: Unremarkable. No pancreatic ductal dilatation or surrounding inflammatory changes. Spleen: Normal in size without focal abnormality. Adrenals/Urinary Tract: The adrenal glands are unremarkable. There is no hydronephrosis on either side. There is symmetric enhancement and excretion of contrast by both kidneys. The urinary bladder is unremarkable. Stomach/Bowel: There is loose stool throughout the colon. There is a small hiatal hernia. No bowel obstruction. Appendectomy. Vascular/Lymphatic: Advanced aortoiliac atherosclerotic disease. The IVC is unremarkable. The SMV, splenic vein, and main portal vein are patent. No portal venous gas. Paraesophageal and gastric varices noted. There is recanalization of the paraumbilical vein. Reproductive: Hysterectomy. Other: Diffuse subcutaneous edema and anasarca, increased since the prior CT. Musculoskeletal: No acute or significant osseous findings. IMPRESSION: 1. Small right pleural effusion with right lung base atelectasis or infiltrate.  Clinical correlation is recommended. 2. Decompensated cirrhosis. 3. Moderate ascites, increased since the prior CT. 4. No bowel obstruction. Aortic Atherosclerosis (ICD10-I70.0). Electronically Signed   By: Anner Crete M.D.   On: 06/23/2019 02:09   US Venous Img Lower Bilateral  Result Date: 06/23/2019 CLINICAL DATA:  Bilateral lower extremity edema EXAM: BILATERAL LOWER EXTREMITY VENOUS DOPPLER ULTRASOUND TECHNIQUE: Gray-scale sonography with graded compression, as well as color Doppler and duplex ultrasound were performed to evaluate the lower extremity deep venous systems from the level of the common femoral vein and including the common femoral, femoral, profunda femoral, popliteal and calf veins including the posterior tibial, peroneal and gastrocnemius veins when visible. The superficial great saphenous vein was also interrogated. Spectral Doppler was utilized to evaluate flow at rest and with distal augmentation maneuvers in the  common femoral, femoral and popliteal veins. COMPARISON:  None. FINDINGS: RIGHT LOWER EXTREMITY Common Femoral Vein: No evidence of thrombus. Normal compressibility, respiratory phasicity and response to augmentation. Saphenofemoral Junction: No evidence of thrombus. Normal compressibility and flow on color Doppler imaging. Profunda Femoral Vein: No evidence of thrombus. Normal compressibility and flow on color Doppler imaging. Femoral Vein: No evidence of thrombus. Normal compressibility, respiratory phasicity and response to augmentation. Popliteal Vein: No evidence of thrombus. Normal compressibility, respiratory phasicity and response to augmentation. Calf Veins: No evidence of thrombus. Normal compressibility and flow on color Doppler imaging. Other Findings:  Peripheral subcutaneous edema noted LEFT LOWER EXTREMITY Common Femoral Vein: No evidence of thrombus. Normal compressibility, respiratory phasicity and response to augmentation. Saphenofemoral Junction: No  evidence of thrombus. Normal compressibility and flow on color Doppler imaging. Profunda Femoral Vein: No evidence of thrombus. Normal compressibility and flow on color Doppler imaging. Femoral Vein: No evidence of thrombus. Normal compressibility, respiratory phasicity and response to augmentation. Popliteal Vein: No evidence of thrombus. Normal compressibility, respiratory phasicity and response to augmentation. Calf Veins: No evidence of thrombus. Normal compressibility and flow on color Doppler imaging. Other Findings:  Peripheral edema noted IMPRESSION: No evidence of deep venous thrombosis in either lower extremity. Electronically Signed   By: Jerilynn Mages.  Shick M.D.   On: 06/23/2019 11:07   Dg Chest Port 1 View  Result Date: 06/23/2019 CLINICAL DATA:  58 year old female with sepsis. EXAM: PORTABLE CHEST 1 VIEW COMPARISON:  Chest radiograph dated 06/03/2019 FINDINGS: There is mild eventration of the right hemidiaphragm. There is a small right pleural effusion with right lung base atelectasis or infiltrate. An area of hazy density in the right apical subpleural region appears similar to prior radiograph and may represent scarring or developing infiltrate. Clinical correlation is recommended. Or the left lung remains clear. No pneumothorax. Stable cardiac silhouette. No acute osseous pathology. IMPRESSION: 1. Small right pleural effusion with right lung base atelectasis or infiltrate. 2. Stable area of hazy density in the right apical subpleural region. Clinical correlation is recommended to exclude developing infiltrate. Electronically Signed   By: Anner Crete M.D.   On: 06/23/2019 03:28   Medications:  I have reviewed the patient's current medications. Prior to Admission:  Medications Prior to Admission  Medication Sig Dispense Refill Last Dose   furosemide (LASIX) 40 MG tablet Take 40 mg by mouth 2 (two) times daily.   Past Week at Unknown time   pantoprazole (PROTONIX) 40 MG tablet Take 1 tablet  (40 mg total) by mouth 2 (two) times daily. 60 tablet 11 Past Week at Unknown time   traMADol (ULTRAM) 50 MG tablet Take 1 tablet (50 mg total) by mouth 2 (two) times daily for 7 days. 14 tablet 0    baclofen (LIORESAL) 10 MG tablet Take 1 tablet (10 mg total) by mouth 3 (three) times daily. Muscle Relaxer 60 each 0    Scheduled:  amiodarone  200 mg Oral BID   Chlorhexidine Gluconate Cloth  6 each Topical Daily   digoxin  0.125 mg Oral Daily   lactobacillus  1 g Oral TID WC   midodrine  5 mg Oral TID WC   pantoprazole  40 mg Oral BID   sulfamethoxazole-trimethoprim  1 tablet Oral Daily   vancomycin  125 mg Oral Daily   Continuous:  [id number], HYDROmorphone (DILAUDID) injection, ondansetron **OR** ondansetron (ZOFRAN) IV Anti-infectives (From admission, onward)   Start     Dose/Rate Route Frequency Ordered Stop   06/24/19 1500  vancomycin (VANCOCIN) 50 mg/mL oral solution 125 mg     125 mg Oral Daily 06/24/19 1315     06/24/19 1400  sulfamethoxazole-trimethoprim (BACTRIM DS) 800-160 MG per tablet 1 tablet     1 tablet Oral Daily 06/24/19 1326     06/24/19 1330  sulfamethoxazole-trimethoprim (BACTRIM) 400-80 MG per tablet 1 tablet  Status:  Discontinued     1 tablet Oral Daily 06/24/19 1315 06/24/19 1326   06/23/19 0800  cefTRIAXone (ROCEPHIN) 2 g in sodium chloride 0.9 % 100 mL IVPB  Status:  Discontinued     2 g 200 mL/hr over 30 Minutes Intravenous Every 24 hours 06/22/19 1919 06/24/19 1307   06/22/19 2000  metroNIDAZOLE (FLAGYL) IVPB 500 mg  Status:  Discontinued     500 mg 100 mL/hr over 60 Minutes Intravenous Every 8 hours 06/22/19 1915 06/24/19 1308   06/22/19 1800  cefTRIAXone (ROCEPHIN) 2 g in sodium chloride 0.9 % 100 mL IVPB     2 g 200 mL/hr over 30 Minutes Intravenous  Once 06/22/19 1749 06/22/19 1938     Scheduled Meds:  amiodarone  200 mg Oral BID   Chlorhexidine Gluconate Cloth  6 each Topical Daily   digoxin  0.125 mg Oral Daily   lactobacillus   1 g Oral TID WC   midodrine  5 mg Oral TID WC   pantoprazole  40 mg Oral BID   sulfamethoxazole-trimethoprim  1 tablet Oral Daily   vancomycin  125 mg Oral Daily   Continuous Infusions: PRN Meds:.baclofen, HYDROmorphone (DILAUDID) injection, ondansetron **OR** ondansetron (ZOFRAN) IV   Assessment: Active Problems:   Sepsis ([place])  Decompensated cirrhosis of liver complicated by SBP Also, C. difficile colitis on vancomycin  Plan: Ascites; no evidence of SBP Continue Bactrim DS long-term for SBP prophylaxis Recommend low-dose diuretics if okay with cardiology as patient is currently volume overloaded With her history of decompensated cirrhosis, map > 65 with preserved renal function is acceptable  History of recent C. difficile colitis Continue oral vancomycin for 2 weeks total  A. fib with RVR: Remains tachycardic, loaded with digoxin.  Not a candidate for anticoagulation at this time per cardiology Appreciate cardiology input  Liver transplant candidacy Patient's current MELD-Na is only 16.  She does not meet the criteria for liver transplant yet.  Explained the process to patient    LOS: 2 days   Vianka Ertel 06/24/2019, 3:20 PM

## 2019-06-24 NOTE — Progress Notes (Signed)
A & O x 4. Denies having pain at this time. BP 71/55 Map of 61. Pt asymptomatic. MD and RN at bedside. See new orders.

## 2019-06-25 ENCOUNTER — Inpatient Hospital Stay: Payer: Medicare Other | Admitting: Family Medicine

## 2019-06-25 LAB — PROTEIN, BODY FLUID (OTHER): Total Protein, Body Fluid Other: 0.7 g/dL

## 2019-06-25 LAB — COMPREHENSIVE METABOLIC PANEL
ALT: 15 U/L (ref 0–44)
AST: 36 U/L (ref 15–41)
Albumin: 2.4 g/dL — ABNORMAL LOW (ref 3.5–5.0)
Alkaline Phosphatase: 411 U/L — ABNORMAL HIGH (ref 38–126)
Anion gap: 6 (ref 5–15)
BUN: <5 mg/dL — ABNORMAL LOW (ref 6–20)
CO2: 25 mmol/L (ref 22–32)
Calcium: 8.2 mg/dL — ABNORMAL LOW (ref 8.9–10.3)
Chloride: 104 mmol/L (ref 98–111)
Creatinine, Ser: 0.37 mg/dL — ABNORMAL LOW (ref 0.44–1.00)
GFR calc Af Amer: >60 mL/min (ref >60)
GFR calc non Af Amer: >60 mL/min (ref >60)
Glucose, Bld: 98 mg/dL (ref 70–99)
Potassium: 4.7 mmol/L (ref 3.5–5.1)
Sodium: 135 mmol/L (ref 135–145)
Total Bilirubin: 1.3 mg/dL — ABNORMAL HIGH (ref 0.3–1.2)
Total Protein: 4.5 g/dL — ABNORMAL LOW (ref 6.5–8.1)

## 2019-06-25 LAB — PATHOLOGIST SMEAR REVIEW

## 2019-06-25 LAB — PHOSPHORUS: Phosphorus: 2.1 mg/dL — ABNORMAL LOW (ref 2.5–4.6)

## 2019-06-25 LAB — DIGOXIN LEVEL: Digoxin Level: 1.1 ng/mL (ref 0.8–2.0)

## 2019-06-25 LAB — MAGNESIUM: Magnesium: 1.7 mg/dL (ref 1.7–2.4)

## 2019-06-25 LAB — HEMOGLOBIN: Hemoglobin: 8.3 g/dL — ABNORMAL LOW (ref 12.0–15.0)

## 2019-06-25 MED ORDER — ALBUMIN HUMAN 25 % IV SOLN
12.5000 g | Freq: Once | INTRAVENOUS | Status: DC
  Filled 2019-06-25: qty 50

## 2019-06-25 MED ORDER — LIDOCAINE-EPINEPHRINE 1 %-1:100000 IJ SOLN
30.0000 mL | Freq: Once | INTRAMUSCULAR | Status: AC
  Administered 2019-06-25: 30 mL via INTRADERMAL
  Filled 2019-06-25 (×2): qty 30

## 2019-06-25 MED ORDER — DIGOXIN 125 MCG PO TABS
0.1250 mg | ORAL_TABLET | Freq: Two times a day (BID) | ORAL | Status: DC
  Administered 2019-06-25 – 2019-06-26 (×3): 0.125 mg via ORAL
  Filled 2019-06-25 (×4): qty 1

## 2019-06-25 MED ORDER — K PHOS MONO-SOD PHOS DI & MONO 155-852-130 MG PO TABS
500.0000 mg | ORAL_TABLET | ORAL | Status: AC
  Administered 2019-06-25 (×3): 500 mg via ORAL
  Filled 2019-06-25 (×3): qty 2

## 2019-06-25 MED ORDER — SPIRONOLACTONE 25 MG PO TABS
50.0000 mg | ORAL_TABLET | Freq: Every day | ORAL | Status: DC
  Administered 2019-06-25 – 2019-06-28 (×4): 50 mg via ORAL
  Filled 2019-06-25 (×5): qty 2

## 2019-06-25 MED ORDER — GABAPENTIN 100 MG PO CAPS
100.0000 mg | ORAL_CAPSULE | Freq: Two times a day (BID) | ORAL | Status: DC
  Administered 2019-06-25 – 2019-06-28 (×7): 100 mg via ORAL
  Filled 2019-06-25 (×7): qty 1

## 2019-06-25 MED ORDER — VANCOMYCIN 50 MG/ML ORAL SOLUTION: 125.0000 mg | Freq: Every day | ORAL | Status: DC

## 2019-06-25 MED ORDER — ALBUMIN HUMAN 25 % IV SOLN
12.5000 g | Freq: Once | INTRAVENOUS | Status: AC
  Administered 2019-06-26: 15:00:00 12.5 g via INTRAVENOUS
  Filled 2019-06-25: qty 50

## 2019-06-25 MED ORDER — FLUOXETINE HCL 10 MG PO CAPS
10.0000 mg | ORAL_CAPSULE | Freq: Every day | ORAL | Status: DC
  Administered 2019-06-26 – 2019-06-28 (×3): 10 mg via ORAL
  Filled 2019-06-25 (×4): qty 1

## 2019-06-25 MED ORDER — FUROSEMIDE 20 MG PO TABS
20.0000 mg | ORAL_TABLET | Freq: Every day | ORAL | Status: DC
  Administered 2019-06-25: 15:00:00 20 mg via ORAL
  Filled 2019-06-25: qty 1

## 2019-06-25 MED ORDER — MAGNESIUM SULFATE 2 GM/50ML IV SOLN
2.0000 g | Freq: Once | INTRAVENOUS | Status: AC
  Administered 2019-06-25: 09:00:00 2 g via INTRAVENOUS
  Filled 2019-06-25: qty 50

## 2019-06-25 MED ORDER — FUROSEMIDE 40 MG PO TABS
40.0000 mg | ORAL_TABLET | Freq: Every day | ORAL | Status: DC
  Administered 2019-06-26 – 2019-06-28 (×3): 40 mg via ORAL
  Filled 2019-06-25 (×3): qty 1

## 2019-06-25 MED ORDER — SPIRONOLACTONE 100 MG PO TABS
100.0000 mg | ORAL_TABLET | Freq: Every day | ORAL | Status: DC
  Filled 2019-06-25: qty 1

## 2019-06-25 NOTE — Consult Note (Signed)
PHARMACY CONSULT NOTE - FOLLOW UP  Pharmacy Consult for Electrolyte Monitoring and Replacement   Recent Labs: Potassium (mmol/L)  Date Value  06/25/2019 4.7   Magnesium (mg/dL)  Date Value  06/25/2019 1.7   Calcium (mg/dL)  Date Value  06/25/2019 8.2 (L)   Albumin (g/dL)  Date Value  06/25/2019 2.4 (L)   Phosphorus (mg/dL)  Date Value  06/25/2019 2.1 (L)   Sodium (mmol/L)  Date Value  06/25/2019 135     Assessment: Pharmacy was consulted for electrolyte management.   K 4.7, Mag 1.7  Goal of Therapy:  Electrolytes WNL  Plan:  Add on Phos - 2.1 Will order NeutraPhos tab - 2 tab q4h x3 doses Mag sulfate 2 g IV x1  Will recheck w/ am labs.  Pharmacy will continue to follow.   Rocky Morel, PharmD, BCPS Clinical Pharmacist 06/25/2019 9:11 AM

## 2019-06-25 NOTE — Procedures (Signed)
Procedure Note  Procedure: closure of previous paracentesis site with simple figure of 8 suture  Preforming Provider: Edison Simon, PA-C  Authorizing Provider: Dr Olean Ree, MD  Indication: Anita Reed is a 58 y.o. female with extensive hepatic history who underwent paracentesis on 10/23 and now has slow leak of peritoneal fluid from previous site.   Anesthetic: 5 ml of 1% lidocaine with epinephrine   Description of Procedure: The patient was identified. The previous paracentesis site was prepped with alcohol wipe. Using a 25 gauge needle 5 ccs of 15 lidocaine with epinephrine was injected intradermal around previous paracentesis site and adequate anesthesia was achieved. A single figure of 8 suture was placed using 3-0 prolene suture. The area was then covered with 2x2 guaze and Tegaderm. The patient tolerate this well without complication. All sharps were accounted for and disposed of in sharps container at end of procedure.   Complications: None Apparent   Plan: General surgery will sign off  -- Edison Simon, PA-C [facility] 06/25/2019, 3:35 PM [phone] M-F: 7am - 4pm

## 2019-06-25 NOTE — Progress Notes (Signed)
Cephas Darby, MD [address]  [place]  [place], [place] [address]  Main: [phone]  Fax: [phone] Pager: [phone]   Subjective: Patient is transferred out of ICU.  Reports feeling better today.  Objective: Vital signs in last 24 hours: Vitals:   06/25/19 0524 06/25/19 0910 06/25/19 1438 06/25/19 1512  BP: (!) 84/58 92/65 106/63   Pulse: (!) 117 100 (!) 124 (!) 110  Resp: 16  18   Temp: 98.4 F (36.9 C)  98.4 F (36.9 C)   TempSrc: Oral  Oral   SpO2: 99% 97% 99%   Weight:      Height:       Weight change:  No intake or output data in the 24 hours ending 06/25/19 1759   Exam: Heart:: Tachycardia Lungs: normal and clear to auscultation Abdomen: Soft, mildly tender, mildly distended, dressing at the paracentesis site due to leakage of fluid   Lab Results: CBC Latest Ref Rng & Units 06/25/2019 06/24/2019 06/23/2019  WBC 4.0 - 10.5 K/uL - 11.0(H) 10.7(H)  Hemoglobin 12.0 - 15.0 g/dL 8.3(L) 8.9(L) 8.2(L)  Hematocrit 36.0 - 46.0 % - 28.1(L) 26.5(L)  Platelets 150 - 400 K/uL - 180 177   CMP Latest Ref Rng & Units 06/25/2019 06/24/2019 06/23/2019  Glucose 70 - 99 mg/dL 98 104(H) -  BUN 6 - 20 mg/dL <5(L) <5(L) -  Creatinine 0.44 - 1.00 mg/dL 0.37(L) 0.50 -  Sodium 135 - 145 mmol/L 135 133(L) -  Potassium 3.5 - 5.1 mmol/L 4.7 4.1 3.6  Chloride 98 - 111 mmol/L 104 102 -  CO2 22 - 32 mmol/L 25 21(L) -  Calcium 8.9 - 10.3 mg/dL 8.2(L) 8.0(L) -  Total Protein 6.5 - 8.1 g/dL 4.5(L) 4.6(L) -  Total Bilirubin 0.3 - 1.2 mg/dL 1.3(H) 1.4(H) -  Alkaline Phos 38 - 126 U/L 411(H) 367(H) -  AST 15 - 41 U/L 36 38 -  ALT 0 - 44 U/L 15 15 -    Micro Results: Recent Results (from the past 240 hour(s))  Blood culture (routine x 2)     Status: None (Preliminary result)   Collection Time: 06/22/19  4:20 PM   Specimen: BLOOD  Result Value Ref Range Status   Specimen Description BLOOD BLOOD RIGHT ARM  Final   Special Requests   Final    BOTTLES DRAWN  AEROBIC AND ANAEROBIC Blood Culture adequate volume   Culture   Final    NO GROWTH 3 DAYS Performed at [facility], [address] [place], [place] [address]    Report Status PENDING  Incomplete  Blood culture (routine x 2)     Status: None (Preliminary result)   Collection Time: 06/22/19  4:20 PM   Specimen: BLOOD  Result Value Ref Range Status   Specimen Description BLOOD BLOOD LEFT HAND  Final   Special Requests   Final    BOTTLES DRAWN AEROBIC AND ANAEROBIC Blood Culture adequate volume   Culture   Final    NO GROWTH 3 DAYS Performed at [facility], [address], [place], [place] [address]    Report Status PENDING  Incomplete  SARS Coronavirus 2 by RT PCR (hospital order, performed in [facility] hospital lab) Nasopharyngeal Nasopharyngeal Swab     Status: None   Collection Time: 06/22/19  4:40 PM   Specimen: Nasopharyngeal Swab  Result Value Ref Range Status   SARS Coronavirus 2 NEGATIVE NEGATIVE Final    Comment: (NOTE) If result is  NEGATIVE SARS-CoV-2 target nucleic acids are NOT DETECTED. The SARS-CoV-2 RNA is generally detectable in upper and lower  respiratory specimens during the acute phase of infection. The lowest  concentration of SARS-CoV-2 viral copies this assay can detect is 250  copies / mL. A negative result does not preclude SARS-CoV-2 infection  and should not be used as the sole basis for treatment or other  patient management decisions.  A negative result may occur with  improper specimen collection / handling, submission of specimen other  than nasopharyngeal swab, presence of viral mutation(s) within the  areas targeted by this assay, and inadequate number of viral copies  (<250 copies / mL). A negative result must be combined with clinical  observations, patient history, and epidemiological information. If result is POSITIVE SARS-CoV-2 target nucleic acids are DETECTED. The SARS-CoV-2 RNA is generally detectable in upper and  lower  respiratory specimens dur ing the acute phase of infection.  Positive  results are indicative of active infection with SARS-CoV-2.  Clinical  correlation with patient history and other diagnostic information is  necessary to determine patient infection status.  Positive results do  not rule out bacterial infection or co-infection with other viruses. If result is PRESUMPTIVE POSTIVE SARS-CoV-2 nucleic acids MAY BE PRESENT.   A presumptive positive result was obtained on the submitted specimen  and confirmed on repeat testing.  While 2019 novel coronavirus  (SARS-CoV-2) nucleic acids may be present in the submitted sample  additional confirmatory testing may be necessary for epidemiological  and / or clinical management purposes  to differentiate between  SARS-CoV-2 and other Sarbecovirus currently known to infect humans.  If clinically indicated additional testing with an alternate test  methodology [phone]) is advised. The SARS-CoV-2 RNA is generally  detectable in upper and lower respiratory sp ecimens during the acute  phase of infection. The expected result is Negative. Fact Sheet for Patients:  StrictlyIdeas.no Fact Sheet for Healthcare Providers: BankingDealers.co.za This test is not yet approved or cleared by the [place] FDA and has been authorized for detection and/or diagnosis of SARS-CoV-2 by FDA under an Emergency Use Authorization (EUA).  This EUA will remain in effect (meaning this test can be used) for the duration of the COVID-19 declaration under Section 564(b)(1) of the Act, 21 U.S.C. section 360bbb-3(b)(1), unless the authorization is terminated or revoked sooner. Performed at [facility], [address] [place] [place], [place] [address]   Urine Culture     Status: None   Collection Time: 06/22/19  6:09 PM   Specimen: Urine, Random  Result Value Ref Range Status   Specimen Description   Final     URINE, RANDOM Performed at [facility], [address], [place], [place] [address]    Special Requests   Final    Normal Performed at [facility], [address] [place], [place] [address]    Culture   Final    NO GROWTH Performed at [facility], [place] [address], [place], [place] [address]    Report Status 06/24/2019 FINAL  Final  Body fluid culture ( includes gram stain)     Status: None (Preliminary result)   Collection Time: 06/22/19  6:44 PM   Specimen: Peritoneal Cavity; Peritoneal Fluid  Result Value Ref Range Status   Specimen Description   Final    PERITONEAL CAVITY Performed at [facility], [address], [place],  [address]    Special Requests   Final    Normal  Performed at [facility], [address] [place], [place] [address]    Gram Stain   Final    WBC PRESENT,BOTH PMN AND MONONUCLEAR NO ORGANISMS SEEN CYTOSPIN SMEAR    Culture   Final    NO GROWTH 3 DAYS Performed at [facility], [place] [address], [place], [place] [address]    Report Status PENDING  Incomplete  MRSA PCR Screening     Status: None   Collection Time: 06/23/19  4:22 AM   Specimen: Nasopharyngeal  Result Value Ref Range Status   MRSA by PCR NEGATIVE NEGATIVE Final    Comment:        The GeneXpert MRSA Assay (FDA approved for NASAL specimens only), is one component of a comprehensive MRSA colonization surveillance program. It is not intended to diagnose MRSA infection nor to guide or monitor treatment for MRSA infections. Performed at [facility], [address] [place], [place] [address]   Body fluid culture with gram stain     Status: None (Preliminary result)   Collection Time: 06/23/19  1:46 PM   Specimen: Peritoneal Washings; Body Fluid  Result Value Ref Range Status   Specimen Description   Final    PERITONEAL FLUID Performed at [facility], [address] [place], [place] [address]     Special Requests   Final    NONE Performed at [facility], [address] [place], [place] [address]    Gram Stain   Final    RARE WBC PRESENT, PREDOMINANTLY MONONUCLEAR NO ORGANISMS SEEN    Culture   Final    NO GROWTH 1 DAY Performed at [facility], [place] [address], [place], [place] [address]    Report Status PENDING  Incomplete   Studies/Results: No results found. Medications:  I have reviewed the patient's current medications. Prior to Admission:  Medications Prior to Admission  Medication Sig Dispense Refill Last Dose  . furosemide (LASIX) 40 MG tablet Take 40 mg by mouth 2 (two) times daily.   Past Week at Unknown time  . pantoprazole (PROTONIX) 40 MG tablet Take 1 tablet (40 mg total) by mouth 2 (two) times daily. 60 tablet 11 Past Week at Unknown time  . traMADol (ULTRAM) 50 MG tablet Take 1 tablet (50 mg total) by mouth 2 (two) times daily for 7 days. 14 tablet 0   . baclofen (LIORESAL) 10 MG tablet Take 1 tablet (10 mg total) by mouth 3 (three) times daily. Muscle Relaxer 60 each 0    Scheduled: . Chlorhexidine Gluconate Cloth  6 each Topical Daily  . digoxin  0.125 mg Oral BID  . feeding supplement (ENSURE ENLIVE)  237 mL Oral BID BM  . FLUoxetine  10 mg Oral Daily  . [START ON 06/26/2019] furosemide  40 mg Oral Daily  . gabapentin  100 mg Oral BID  . lactobacillus  1 g Oral TID WC  . midodrine  5 mg Oral TID WC  . multivitamin with minerals  1 tablet Oral Daily  . pantoprazole  40 mg Oral BID  . spironolactone  50 mg Oral Daily  . sulfamethoxazole-trimethoprim  1 tablet Oral Daily   Continuous: . albumin human     [id number], HYDROmorphone (DILAUDID) injection, ondansetron **OR** ondansetron (ZOFRAN) IV Anti-infectives (From admission, onward)   Start     Dose/Rate Route Frequency Ordered Stop   06/26/19 1000  vancomycin (VANCOCIN) 50 mg/mL oral solution 125 mg  Status:  Discontinued  Note to Pharmacy: Stop vanc if 14 day course is done  from the start date   thx   125 mg Oral Daily 06/25/19 0939 06/25/19 1448   06/24/19 1500  vancomycin (VANCOCIN) 50 mg/mL oral solution 125 mg  Status:  Discontinued     125 mg Oral Daily 06/24/19 1315 06/25/19 0939   06/24/19 1400  sulfamethoxazole-trimethoprim (BACTRIM DS) 800-160 MG per tablet 1 tablet     1 tablet Oral Daily 06/24/19 1326     06/24/19 1330  sulfamethoxazole-trimethoprim (BACTRIM) 400-80 MG per tablet 1 tablet  Status:  Discontinued     1 tablet Oral Daily 06/24/19 1315 06/24/19 1326   06/23/19 0800  cefTRIAXone (ROCEPHIN) 2 g in sodium chloride 0.9 % 100 mL IVPB  Status:  Discontinued     2 g 200 mL/hr over 30 Minutes Intravenous Every 24 hours 06/22/19 1919 06/24/19 1307   06/22/19 2000  metroNIDAZOLE (FLAGYL) IVPB 500 mg  Status:  Discontinued     500 mg 100 mL/hr over 60 Minutes Intravenous Every 8 hours 06/22/19 1915 06/24/19 1308   06/22/19 1800  cefTRIAXone (ROCEPHIN) 2 g in sodium chloride 0.9 % 100 mL IVPB     2 g 200 mL/hr over 30 Minutes Intravenous  Once 06/22/19 1749 06/22/19 1938     Scheduled Meds: . Chlorhexidine Gluconate Cloth  6 each Topical Daily  . digoxin  0.125 mg Oral BID  . feeding supplement (ENSURE ENLIVE)  237 mL Oral BID BM  . FLUoxetine  10 mg Oral Daily  . [START ON 06/26/2019] furosemide  40 mg Oral Daily  . gabapentin  100 mg Oral BID  . lactobacillus  1 g Oral TID WC  . midodrine  5 mg Oral TID WC  . multivitamin with minerals  1 tablet Oral Daily  . pantoprazole  40 mg Oral BID  . spironolactone  50 mg Oral Daily  . sulfamethoxazole-trimethoprim  1 tablet Oral Daily   Continuous Infusions: . albumin human     PRN Meds:.baclofen, HYDROmorphone (DILAUDID) injection, ondansetron **OR** ondansetron (ZOFRAN) IV   Assessment: Active Problems:   Sepsis ([place])  Decompensated cirrhosis of liver complicated by SBP Also, C. difficile colitis on vancomycin  Plan: Ascites; no evidence of SBP Continue Bactrim DS long-term for SBP  prophylaxis Patient is started on midodrine 5 mg 3 times daily, continue this as outpatient, this will help to regulate her blood pressure and will be able to diurese well We will try Lasix 40 mg and spironolactone 50 mg daily with close monitoring of renal function and electrolytes Paracentesis site has been sutured by surgery, appreciate their help  History of C. difficile colitis in 05/2019 Patient finished oral vancomycin for 2 weeks total  A. fib with RVR: Remains tachycardic, cardiology recommends digoxin 0.125 mg twice daily.  Not a candidate for anticoagulation at this time as risks overweigh benefits of developing GI bleed Appreciate cardiology input  Liver transplant candidacy Patient's current MELD-Na is only 16.  She does not meet the criteria for liver transplant yet.  Explained the process to patient Palliative care is following    LOS: 3 days     06/25/2019, 5:59 PM

## 2019-06-25 NOTE — Progress Notes (Signed)
Progress Note  Patient Name: Anita Reed Date of Encounter: 06/25/2019  Primary Cardiologist: No primary care provider on file.   Subjective   No acute events over the past 24 hours, feels okay.  Inpatient Medications    Scheduled Meds:  Chlorhexidine Gluconate Cloth  6 each Topical Daily   digoxin  0.125 mg Oral BID   feeding supplement (ENSURE ENLIVE)  237 mL Oral BID BM   lactobacillus  1 g Oral TID WC   midodrine  5 mg Oral TID WC   multivitamin with minerals  1 tablet Oral Daily   pantoprazole  40 mg Oral BID   phosphorus  500 mg Oral Q4H   sulfamethoxazole-trimethoprim  1 tablet Oral Daily   [START ON 06/26/2019] vancomycin  125 mg Oral Daily   Continuous Infusions:  PRN Meds: baclofen, HYDROmorphone (DILAUDID) injection, ondansetron **OR** ondansetron (ZOFRAN) IV   Vital Signs    Vitals:   06/24/19 2008 06/24/19 2010 06/25/19 0524 06/25/19 0910  BP: 119/77 126/72 (!) 84/58 92/65  Pulse: (!) 132 (!) 131 (!) 117 100  Resp: 18 18 16    Temp:   98.4 F (36.9 C)   TempSrc:   Oral   SpO2: 97% 96% 99% 97%  Weight:      Height:       No intake or output data in the 24 hours ending 06/25/19 1037 Last 3 Weights 06/24/2019 06/22/2019 06/22/2019  Weight (lbs) 167 lb 12.3 oz 168 lb 165 lb  Weight (kg) 76.1 kg 76.204 kg 74.844 kg      Telemetry    Atrial flutter heart rate 108 .- Personally Reviewed  ECG    None obtained today.  Physical Exam   GEN: No acute distress.   Neck: No JVD Cardiac:  Tachycardic, no murmurs, rubs, or gallops.  Respiratory: Clear to auscultation bilaterally. GI: Soft, mild discomfort, distended MS:  1+ edema; No deformity. Neuro:  Nonfocal  Psych: Normal affect   Labs    High Sensitivity Troponin:   Recent Labs  Lab 06/01/19 1251 06/01/19 1926  TROPONINIHS 12 15      Chemistry Recent Labs  Lab 06/23/19 0522  06/23/19 2306 06/24/19 0511 06/25/19 0418  NA 132*  --   --  133* 135  K 2.8*   < > 3.6 4.1  4.7  CL 97*  --   --  102 104  CO2 24  --   --  21* 25  GLUCOSE 102*  --   --  104* 98  BUN 6  --   --  <5* <5*  CREATININE 0.56  --   --  0.50 0.37*  CALCIUM 7.8*  --   --  8.0* 8.2*  PROT 4.9*  --   --  4.6* 4.5*  ALBUMIN 2.7*  --   --  2.5* 2.4*  AST 36  --   --  38 36  ALT 17  --   --  15 15  ALKPHOS 385*  --   --  367* 411*  BILITOT 1.3*  --   --  1.4* 1.3*  GFRNONAA >60  --   --  >60 >60  GFRAA >60  --   --  >60 >60  ANIONGAP 11  --   --  10 6   < > = values in this interval not displayed.     Hematology Recent Labs  Lab 06/22/19 1600 06/23/19 0522 06/24/19 0511 06/25/19 0418  WBC 12.3* 10.7* 11.0*  --  RBC 4.20 3.35* 3.58*  --   HGB 10.4* 8.2* 8.9* 8.3*  HCT 33.2* 26.5* 28.1*  --   MCV 79.0* 79.1* 78.5*  --   MCH 24.8* 24.5* 24.9*  --   MCHC 31.3 30.9 31.7  --   RDW 19.1* 19.0* 19.3*  --   PLT 222 177 180  --     BNPNo results for input(s): BNP, PROBNP in the last 168 hours.   DDimer No results for input(s): DDIMER in the last 168 hours.   Radiology    US Venous Img Lower Bilateral  Result Date: 06/23/2019 CLINICAL DATA:  Bilateral lower extremity edema EXAM: BILATERAL LOWER EXTREMITY VENOUS DOPPLER ULTRASOUND TECHNIQUE: Gray-scale sonography with graded compression, as well as color Doppler and duplex ultrasound were performed to evaluate the lower extremity deep venous systems from the level of the common femoral vein and including the common femoral, femoral, profunda femoral, popliteal and calf veins including the posterior tibial, peroneal and gastrocnemius veins when visible. The superficial great saphenous vein was also interrogated. Spectral Doppler was utilized to evaluate flow at rest and with distal augmentation maneuvers in the common femoral, femoral and popliteal veins. COMPARISON:  None. FINDINGS: RIGHT LOWER EXTREMITY Common Femoral Vein: No evidence of thrombus. Normal compressibility, respiratory phasicity and response to augmentation.  Saphenofemoral Junction: No evidence of thrombus. Normal compressibility and flow on color Doppler imaging. Profunda Femoral Vein: No evidence of thrombus. Normal compressibility and flow on color Doppler imaging. Femoral Vein: No evidence of thrombus. Normal compressibility, respiratory phasicity and response to augmentation. Popliteal Vein: No evidence of thrombus. Normal compressibility, respiratory phasicity and response to augmentation. Calf Veins: No evidence of thrombus. Normal compressibility and flow on color Doppler imaging. Other Findings:  Peripheral subcutaneous edema noted LEFT LOWER EXTREMITY Common Femoral Vein: No evidence of thrombus. Normal compressibility, respiratory phasicity and response to augmentation. Saphenofemoral Junction: No evidence of thrombus. Normal compressibility and flow on color Doppler imaging. Profunda Femoral Vein: No evidence of thrombus. Normal compressibility and flow on color Doppler imaging. Femoral Vein: No evidence of thrombus. Normal compressibility, respiratory phasicity and response to augmentation. Popliteal Vein: No evidence of thrombus. Normal compressibility, respiratory phasicity and response to augmentation. Calf Veins: No evidence of thrombus. Normal compressibility and flow on color Doppler imaging. Other Findings:  Peripheral edema noted IMPRESSION: No evidence of deep venous thrombosis in either lower extremity. Electronically Signed   By: Jerilynn Mages.  Shick M.D.   On: 06/23/2019 11:07    Cardiac Studies  Echocardiogram date 01/19/2019 1. The left ventricle has normal systolic function, with an ejection fraction of 55-60%. The cavity size was normal. Left ventricular diastolic function could not be evaluated. No evidence of left ventricular regional wall motion abnormalities.  2. The right ventricle has normal systolic function. The cavity was not assessed. There is no increase in right ventricular wall thickness. Right ventricular systolic pressure is mildly  elevated with an estimated pressure of 35.0 mmHg.  3. Left atrial size was mildly dilated.  4. The mitral valve is degenerative. Mild thickening of the mitral valve leaflet. There is moderate mitral annular calcification present. No evidence of mitral valve stenosis.  5. The tricuspid valve is grossly normal. Tricuspid valve regurgitation is mild-moderate.  6. The aortic valve is tricuspid. Moderate thickening of the aortic valve. Mild calcification of the aortic valve.  7. The aortic root is normal in size and structure.  8. The inferior vena cava was dilated in size with <50% respiratory variability.  9. Underlying  rhythm appears to be atrial flutter.  Patient Profile     58 y.o. female with history of alcohol induced cirrhosis, current smoker, esophageal varices, atrial flutter presents to the emergency department due to hypokalemia.  Being seen for atrial flutter.  Assessment & Plan    Patient continues to feels better .  Her heart rate is slightly improved at 108.  She is in atrial flutter.  Medications usually used for heart rate control such as beta-blocker, diltiazem, amiodarone all decrease blood pressure and not the best option for her since her current systolic pressure is in the 80s.  continue with digoxin 0.125 mg twice daily for heart rate control.  Anticoagulation is obviously contraindicated due to history of blood loss.    Signed, Kate Sable, MD  06/25/2019, 10:37 AM

## 2019-06-25 NOTE — Progress Notes (Signed)
Patient ID: Anita Reed, female   DOB: 1961/02/13, 58 y.o.   MRN: [id number]  [employer] [facility] PROGRESS NOTE  Anita Reed [id number] DOB: 1961-02-28 DOA: 06/22/2019 PCP: Hubbard Hartshorn, FNP  HPI/Subjective: Patient feels better and wants to go home.  Still tachycardic and concerned as peritoneal fluid is leaking from the paracentesis place.  Denies any watery diarrhea but has loose stool this morning x1 Objective: Vitals:   06/25/19 0524 06/25/19 0910  BP: (!) 84/58 92/65  Pulse: (!) 117 100  Resp: 16   Temp: 98.4 F (36.9 C)   SpO2: 99% 97%   No intake or output data in the 24 hours ending 06/25/19 1414 Filed Weights   06/22/19 1530 06/22/19 2029 06/24/19 1500  Weight: 74.8 kg 76.2 kg 76.1 kg    ROS: Review of Systems  Constitutional: Negative for chills and fever.  Eyes: Negative for blurred vision.  Respiratory: Negative for cough and shortness of breath.   Cardiovascular: Negative for chest pain.  Gastrointestinal: Positive for abdominal pain and diarrhea. Negative for constipation, nausea and vomiting.  Genitourinary: Negative for dysuria.  Musculoskeletal: Negative for joint pain.  Neurological: Negative for dizziness and headaches.   Exam: Physical Exam  Constitutional: She is oriented to person, place, and time.  HENT:  Nose: No mucosal edema.  Mouth/Throat: No oropharyngeal exudate or posterior oropharyngeal edema.  Eyes: Pupils are equal, round, and reactive to light. Conjunctivae, EOM and lids are normal.  Neck: No JVD present. Carotid bruit is not present. No edema present. No thyroid mass and no thyromegaly present.  Cardiovascular: S1 normal and S2 normal. Exam reveals no gallop.  No murmur heard. Respiratory: No respiratory distress. She has decreased breath sounds in the right lower field and the left lower field. She has no wheezes. She has no rhonchi. She has no rales.  GI: Soft. Bowel sounds are normal. She exhibits distension. There is no  abdominal tenderness.  Leaking peritoneal fluid from the paracentesis site  Musculoskeletal:     Right ankle: She exhibits swelling.     Left ankle: She exhibits swelling.  Lymphadenopathy:    She has no cervical adenopathy.  Neurological: She is alert and oriented to person, place, and time. No cranial nerve deficit.  Skin: Skin is warm. No rash noted. Nails show no clubbing.  Psychiatric: She has a normal mood and affect.      Data Reviewed: Basic Metabolic Panel: Recent Labs  Lab 06/22/19 1332 06/22/19 1600  06/23/19 0522 06/23/19 1831 06/23/19 2306 06/24/19 0511 06/25/19 0418  NA 133* 131*  --  132*  --   --  133* 135  K 2.6* 2.5*   < > 2.8* 3.6 3.6 4.1 4.7  CL 94* 93*  --  97*  --   --  102 104  CO2 25 25  --  24  --   --  21* 25  GLUCOSE 123* 117*  --  102*  --   --  104* 98  BUN 8 8  --  6  --   --  <5* <5*  CREATININE 0.68 0.62  --  0.56  --   --  0.50 0.37*  CALCIUM 8.4* 8.2*  --  7.8*  --   --  8.0* 8.2*  MG  --  1.5*  --  1.7  --  1.7 1.7 1.7  PHOS  --   --   --  2.7  --  2.0* 2.0* 2.1*   < > = values  in this interval not displayed.   Liver Function Tests: Recent Labs  Lab 06/22/19 1332 06/22/19 1600 06/23/19 0522 06/24/19 0511 06/25/19 0418  AST 44* 45* 36 38 36  ALT 20 19 17 15 15   ALKPHOS 458* 438* 385* 367* 411*  BILITOT 2.2* 2.1* 1.3* 1.4* 1.3*  PROT 5.7* 5.5* 4.9* 4.6* 4.5*  ALBUMIN 3.0* 2.9* 2.7* 2.5* 2.4*   CBC: Recent Labs  Lab 06/22/19 1332 06/22/19 1600 06/23/19 0522 06/24/19 0511 06/25/19 0418  WBC 13.3* 12.3* 10.7* 11.0*  --   NEUTROABS 9.9* 9.5*  --  7.7  --   HGB 10.4* 10.4* 8.2* 8.9* 8.3*  HCT 33.0* 33.2* 26.5* 28.1*  --   MCV 79.5* 79.0* 79.1* 78.5*  --   PLT 223 222 177 180  --    BNP (last 3 results) Recent Labs    01/18/19 1220  BNP 771.0*    CBG: Recent Labs  Lab 06/23/19 0335  GLUCAP 125*    Recent Results (from the past 240 hour(s))  Blood culture (routine x 2)     Status: None (Preliminary result)    Collection Time: 06/22/19  4:20 PM   Specimen: BLOOD  Result Value Ref Range Status   Specimen Description BLOOD BLOOD RIGHT ARM  Final   Special Requests   Final    BOTTLES DRAWN AEROBIC AND ANAEROBIC Blood Culture adequate volume   Culture   Final    NO GROWTH 3 DAYS Performed at [facility], [address] [place], [place] [address]    Report Status PENDING  Incomplete  Blood culture (routine x 2)     Status: None (Preliminary result)   Collection Time: 06/22/19  4:20 PM   Specimen: BLOOD  Result Value Ref Range Status   Specimen Description BLOOD BLOOD LEFT HAND  Final   Special Requests   Final    BOTTLES DRAWN AEROBIC AND ANAEROBIC Blood Culture adequate volume   Culture   Final    NO GROWTH 3 DAYS Performed at [facility], [address] [place], [place] [address]    Report Status PENDING  Incomplete  SARS Coronavirus 2 by RT PCR (hospital order, performed in [place] hospital lab) Nasopharyngeal Nasopharyngeal Swab     Status: None   Collection Time: 06/22/19  4:40 PM   Specimen: Nasopharyngeal Swab  Result Value Ref Range Status   SARS Coronavirus 2 NEGATIVE NEGATIVE Final    Comment: (NOTE) If result is NEGATIVE SARS-CoV-2 target nucleic acids are NOT DETECTED. The SARS-CoV-2 RNA is generally detectable in upper and lower  respiratory specimens during the acute phase of infection. The lowest  concentration of SARS-CoV-2 viral copies this assay can detect is 250  copies / mL. A negative result does not preclude SARS-CoV-2 infection  and should not be used as the sole basis for treatment or other  patient management decisions.  A negative result may occur with  improper specimen collection / handling, submission of specimen other  than nasopharyngeal swab, presence of viral mutation(s) within the  areas targeted by this assay, and inadequate number of viral copies  (<250 copies / mL). A negative result must be combined with clinical   observations, patient history, and epidemiological information. If result is POSITIVE SARS-CoV-2 target nucleic acids are DETECTED. The SARS-CoV-2 RNA is generally detectable in upper and lower  respiratory specimens dur ing the acute phase of infection.  Positive  results are indicative of active infection with SARS-CoV-2.  Clinical  correlation  with patient history and other diagnostic information is  necessary to determine patient infection status.  Positive results do  not rule out bacterial infection or co-infection with other viruses. If result is PRESUMPTIVE POSTIVE SARS-CoV-2 nucleic acids MAY BE PRESENT.   A presumptive positive result was obtained on the submitted specimen  and confirmed on repeat testing.  While 2019 novel coronavirus  (SARS-CoV-2) nucleic acids may be present in the submitted sample  additional confirmatory testing may be necessary for epidemiological  and / or clinical management purposes  to differentiate between  SARS-CoV-2 and other Sarbecovirus currently known to infect humans.  If clinically indicated additional testing with an alternate test  methodology [phone]) is advised. The SARS-CoV-2 RNA is generally  detectable in upper and lower respiratory sp ecimens during the acute  phase of infection. The expected result is Negative. Fact Sheet for Patients:  StrictlyIdeas.no Fact Sheet for Healthcare Providers: BankingDealers.co.za This test is not yet approved or cleared by the [place] FDA and has been authorized for detection and/or diagnosis of SARS-CoV-2 by FDA under an Emergency Use Authorization (EUA).  This EUA will remain in effect (meaning this test can be used) for the duration of the COVID-19 declaration under Section 564(b)(1) of the Act, 21 U.S.C. section 360bbb-3(b)(1), unless the authorization is terminated or revoked sooner. Performed at [facility], [address] [place], [place] [address]   Urine Culture     Status: None   Collection Time: 06/22/19  6:09 PM   Specimen: Urine, Random  Result Value Ref Range Status   Specimen Description   Final    URINE, RANDOM Performed at [facility], [address] [place], [place] [address]    Special Requests   Final    Normal Performed at [facility], [address] [place], [place] [address]    Culture   Final    NO GROWTH Performed at [facility], [place] [address], [place], [place] [address]    Report Status 06/24/2019 FINAL  Final  Body fluid culture ( includes gram stain)     Status: None (Preliminary result)   Collection Time: 06/22/19  6:44 PM   Specimen: Peritoneal Cavity; Peritoneal Fluid  Result Value Ref Range Status   Specimen Description   Final    PERITONEAL CAVITY Performed at [facility], [address] [place], [place] [address]    Special Requests   Final    Normal Performed at [facility], [address] [place], [place] [address]    Gram Stain   Final    WBC PRESENT,BOTH PMN AND MONONUCLEAR NO ORGANISMS SEEN CYTOSPIN SMEAR    Culture   Final    NO GROWTH 3 DAYS Performed at [facility], [place] [address], [place], [place] [address]    Report Status PENDING  Incomplete  MRSA PCR Screening     Status: None   Collection Time: 06/23/19  4:22 AM   Specimen: Nasopharyngeal  Result Value Ref Range Status   MRSA by PCR NEGATIVE NEGATIVE Final    Comment:        The GeneXpert MRSA Assay (FDA approved for NASAL specimens only), is one component of a comprehensive MRSA colonization surveillance program. It is not intended to diagnose MRSA infection nor to guide or monitor treatment for MRSA infections. Performed at [facility], [address], [place], [place] [address]   Body fluid culture with gram stain  Status: None (Preliminary result)   Collection Time: 06/23/19  1:46 PM   Specimen: Peritoneal  [place]; Body Fluid  Result Value Ref Range Status   Specimen Description   Final    PERITONEAL FLUID Performed at [facility], [address] [place], [place] [address]    Special Requests   Final    NONE Performed at [facility], [address] [place], [place] [address]    Gram Stain   Final    RARE WBC PRESENT, PREDOMINANTLY MONONUCLEAR NO ORGANISMS SEEN    Culture   Final    NO GROWTH 1 DAY Performed at [facility], [place] [address], [place], [place] [address]    Report Status PENDING  Incomplete     Studies: No results found.  Scheduled Meds: . Chlorhexidine Gluconate Cloth  6 each Topical Daily  . digoxin  0.125 mg Oral BID  . feeding supplement (ENSURE ENLIVE)  237 mL Oral BID BM  . FLUoxetine  10 mg Oral Daily  . furosemide  20 mg Oral Daily  . gabapentin  100 mg Oral BID  . lactobacillus  1 g Oral TID WC  . midodrine  5 mg Oral TID WC  . multivitamin with minerals  1 tablet Oral Daily  . pantoprazole  40 mg Oral BID  . phosphorus  500 mg Oral Q4H  . sulfamethoxazole-trimethoprim  1 tablet Oral Daily  . [START ON 06/26/2019] vancomycin  125 mg Oral Daily   Continuous Infusions:   Assessment/Plan:  1. Clinical sepsis, present on admission.  SBP ruled out.  Case discussed with critical care specialist and he thinks this is atelectasis and not pneumonia.  Discontinue antibiotics.  2. Cirrhosis of the liver with ascites that is recurrent.  Mortality is very high within 2 years of developing ascites.  Hopefully can go back on Lasix and Aldactone soon.  Blood pressure is the limiting factor at this point.  Paracentesis yesterday.  Gastroenterology recommended Bactrim DS daily to prevent SBP.  GI has recommended to discontinue p.o. vancomycin if patient has completed 14-day course from the start date.  Patient is not a candidate for liver transplantation as her meld score is at 97.  GI is recommending to add low-dose Lasix and Aldactone 50  mg once daily.  We will start with low-dose Lasix and monitor blood pressure 3. hypotension.  Patient states that her blood pressure has been low for a while. 4. Atrial fibrillation with rapid ventricular response.  [place] cardiology consultation.  Patient loaded with digoxin.  Monitor digoxin level and discontinue amiodarone as the patient is hypotensive and increase the digoxin dose to twice a day .  Not a candidate for anticoagulation currently. 5. Hypokalemia and hypomagnesemia.  Replaced 6. Hepatitis C.  As per husband this was treated. 7. Peritoneal fluid leakage from the paracentesis site.-As patient is at high risk for spontaneous bacterial peritonitis, surgery consult placed to put a stitch to prevent leakage of the peritoneal fluid.  Dr. Hampton Abbot notified 8. Disposition Home health ask home health PT.  Outpatient palliative care is recommended by palliative care specialist  Code Status:     Code Status Orders  (From admission, onward)         Start     Ordered   06/22/19 2023  Do not attempt resuscitation (DNR)  Continuous    Question Answer Comment  In the event of cardiac or respiratory ARREST Do not call a "code blue"   In the  event of cardiac or respiratory ARREST Do not perform Intubation, CPR, defibrillation or ACLS   In the event of cardiac or respiratory ARREST Use medication by any route, position, wound care, and other measures to relive pain and suffering. May use oxygen, suction and manual treatment of airway obstruction as needed for comfort.   Comments RN may pronounce      06/22/19 2023        Code Status History    Date Active Date Inactive Code Status Order ID Comments User Context   06/01/2019 2148 06/11/2019 2150 Full Code [id number]  Bettey Costa, MD Inpatient   01/28/2019 1920 01/31/2019 1827 Full Code [id number]  Otila Back, MD ED   01/18/2019 1651 01/19/2019 1946 DNR [id number]  Otila Back, MD ED   Advance Care Planning Activity     Family Communication:  Spoke with husband on the phone Disposition Plan: To be determined  Consultants:  Cardiology-cmhg  Gastroenterology  Antibiotics:  Rocephin  Flagyl  Time spent: 36  minutes, case discussed with critical care specialist.  Nicholes Mango  [employer]

## 2019-06-25 NOTE — Evaluation (Signed)
Physical Therapy Evaluation Patient Details Name: Anita Reed MRN: [id number] DOB: 1961/02/27 Today's Date: 06/25/2019   History of Present Illness  pt is a 58yo female who was admitted after having abnormal labs during follow up with PCP. pt recently admitted from 10/12-10/22 for GI bleeding, C Diff and bacterial peritonitis  Clinical Impression  Pt is a 58 yo female admitted for above. Pt in bed upon arrival and agreed to participate with PT. Pt presents with decreased strength, endurance and balance limiting functional mobility. Pt reports being independent with ADLs but the longer she is up the more swelling she gets and mobility becomes more difficult. Pt reports her husband is able to assist her but he works long hours and while he is gone she is mostly sedentary sitting on the couch. Pt ambulated in room with min guard assist. Pt frequently reached for wall and furniture in room. Pt reported not feeling safe when performing standing balance challenges. Pt reported 6/10 pain in both her legs and feet with nursing present and providing pain medication beginning of session. Pt would benefit from skilled acute therapy to improve strength, endurance and balance. Pt would benefit from home health PT following hospital discharge in order to continue improving deficits and allow for increased independence and more activity during the day and decreasing fall risk. Pt educated on recommendation and agreed that she would like to improve her strength and endurance so she could do more.     Follow Up Recommendations Home health PT    Equipment Recommendations  None recommended by PT    Recommendations for Other Services       Precautions / Restrictions Precautions Precautions: Fall Restrictions Weight Bearing Restrictions: No      Mobility  Bed Mobility Overal bed mobility: Needs Assistance Bed Mobility: Supine to Sit;Sit to Supine     Supine to sit: Supervision;HOB elevated Sit to  supine: Supervision;HOB elevated   General bed mobility comments: pt able to get EOB and to return to supine without physical assist however required increased time, effort and use of bed rails  Transfers Overall transfer level: Needs assistance Equipment used: None Transfers: Sit to/from Stand Sit to Stand: Min guard         General transfer comment: pt did not require assist to rise however trunk forward flexed and increased time and effort to stand  Ambulation/Gait Ambulation/Gait assistance: Min guard Gait Distance (Feet): 10 Feet Assistive device: None Gait Pattern/deviations: Step-through pattern;Decreased stride length;Trunk flexed Gait velocity: decreased   General Gait Details: pt ambulated in room with decreased gait speed and cadence, pt frequently reaching out for wall and furniture, pt stated she felt like she needed to reach for something just in case, pt slow and cautious during ambulation  Stairs            Wheelchair Mobility    Modified Rankin (Stroke Patients Only)       Balance Overall balance assessment: Needs assistance Sitting-balance support: Feet supported Sitting balance-Leahy Scale: Good Sitting balance - Comments: steady sitting EOB during LE therex   Standing balance support: No upper extremity supported Standing balance-Leahy Scale: Fair Standing balance comment: pt able to maintain standing balance without UE support, NBOS without UE support ~20 sec with minimal increased sway, semi tandem ~10 sec with increased sway and pt stating "i dont feel safe"  Pertinent Vitals/Pain Pain Assessment: 0-10 Pain Score: 6  Pain Location: B legs, B feet Pain Descriptors / Indicators: Aching;Burning Pain Intervention(s): Limited activity within patient's tolerance;Monitored during session;RN gave pain meds during session    [place] expects to be discharged to:: Private residence Living  Arrangements: Spouse/significant other Available Help at Discharge: Family;Available PRN/intermittently;Other (Comment)(husband is able to help her but he works really long hours during the day) Type of Home: House Home Access: Stairs to enter Entrance Stairs-Rails: Chemical engineer of Steps: 3 Home Layout: One level [place]: [place] - single point      Prior Function Level of Independence: Independent         Comments: pt states she is independent with all ADLs prior to hospitilization, pt does not drive, pt states that sometimes she needs to use her husband to assist getting out of bed/getting up from couch because the longer she is up moving the more her LEs swell and get stiff and mobility is more difficult, pt states she sits on the couch and watches tv during the day while her husband is at work and has to prepare herself for getting up to restroom or fix meals     Hand Dominance        Extremity/Trunk Assessment   Upper Extremity Assessment Upper Extremity Assessment: Overall WFL for tasks assessed    Lower Extremity Assessment Lower Extremity Assessment: Generalized weakness       Communication   Communication: No difficulties  Cognition Arousal/Alertness: Awake/alert Behavior During Therapy: WFL for tasks assessed/performed Overall Cognitive Status: Within Functional Limits for tasks assessed                                        General Comments General comments (skin integrity, edema, etc.): increased edema B LEs    Exercises Total Joint Exercises Ankle Circles/Pumps: AROM;Both;10 reps Heel Slides: AROM;Both;10 reps Long Arc Quad: AROM;Both;10 reps   Assessment/Plan    PT Assessment Patient needs continued PT services  PT Problem List Decreased strength;Decreased mobility;Decreased activity tolerance;Decreased balance;Decreased knowledge of use of DME       PT Treatment Interventions Gait training;Balance  training;Stair training;Neuromuscular re-education;Functional mobility training;Therapeutic activities;Patient/family education;Therapeutic exercise    PT Goals (Current goals can be found in the Care Plan section)  Acute Rehab PT Goals Patient Stated Goal: go home PT Goal Formulation: With patient Time For Goal Achievement: 07/09/19 Potential to Achieve Goals: Good    Frequency Min 2X/week   Barriers to discharge Decreased caregiver support      Co-evaluation               AM-PAC PT "6 Clicks" Mobility  Outcome Measure Help needed turning from your back to your side while in a flat bed without using bedrails?: None Help needed moving from lying on your back to sitting on the side of a flat bed without using bedrails?: None Help needed moving to and from a bed to a chair (including a wheelchair)?: None Help needed standing up from a chair using your arms (e.g., wheelchair or bedside chair)?: None Help needed to walk in hospital room?: A Little Help needed climbing 3-5 steps with a railing? : A Little 6 Click Score: 22    End of Session Equipment Utilized During Treatment: Gait belt Activity Tolerance: Patient limited by fatigue;Patient limited by pain Patient left: in bed;with call bell/phone within  reach Nurse Communication: Mobility status PT Visit Diagnosis: Muscle weakness (generalized) (M62.81);Unsteadiness on feet (R26.81);Other abnormalities of gait and mobility (R26.89)    Time: [id number] PT Time Calculation (min) (ACUTE ONLY): 42 min   Charges:   PT Evaluation $PT Eval Low Complexity: 1 Low PT Treatments $Therapeutic Exercise: 8-22 mins        Kazumi Lachney PT, DPT 12:21 PM,06/25/19 [phone]

## 2019-06-25 NOTE — Consult Note (Signed)
Consultation Note Date: 06/25/2019   Patient Name: Anita Reed  DOB: Sep 22, 1960  MRN: [id number]  Age / Sex: 58 y.o., female  PCP: Hubbard Hartshorn, FNP Referring Physician: Nicholes Mango, MD  Reason for Consultation: Establishing goals of care  HPI/Patient Profile: 58 y.o. female  with past medical history of alcoholic liver cirrhosis, HTN, lymphoma, recent h/o C. Diff colitis, GIB, SBP admitted on 06/22/2019 with low potassium and magnesium, hypotension, tachycardia. Electrolytes replaced and stable.   Clinical Assessment and Goals of Care: I met today with Anita Reed along with NP student, Roselyn Reef. Anita Reed opens up with [place] about her regret for how she developed alcoholic cirrhosis and never felt that she drank that much. She does clarify that she did drink everyday but nothing like her father drank. [name] feels like her QOL is suffering and she is very concerned with swelling that contributes to abd discomfort and neuropathy in her legs. She is tearful telling us that she knows this is only going to get worse and that she does not want to live like this. She clarifies that she is not suicidal at all but just doesn't want to suffer. She is also depressed with poor support and only connections are her husband (who works and doesn't really understand what she is going through) and her dog.   We did discuss her goals and if this included pursuing transplant and she tells [place] that her husband wants her to live but she is worried about how much more she would wish to go through. She has been through chemo with lymphoma and feels that she has suffered enough and not very interested in aggressive measures to extend her life and she shows me her DNR bracelet. At this time her goal is to optimize her QOL mainly involving symptom management. She is open to adjunctive pain medications (not just opioids) and pain clinic if necessary.  She feels that she is stigmatized with not having her pain adequately treated as she has alcoholic cirrhosis. We did discuss overall barriers to everyone to have adequate pain management given the opioid crisis.   We discussed plan for trials of gabapentin, antidepressant, and outpatient palliative to follow for support. Anita Reed tells [place] that this is probably the only time she has opened up about her health and feelings and that it feels good to be heard listened to. All questions/concerns addressed. Emotional support provided.   Primary Decision Maker PATIENT    SUMMARY OF RECOMMENDATIONS   - Outpt palliative to continue conversation, support, and symptom recommendations - She has had zero alcohol since learning of her diagnosis in May 2020  Code Status/Advance Care Planning:  DNR   Symptom Management:   Depression: Fluoxetine 10 mg daily beginning 06/25/19.   BLE neuropathy: Gabapentin 100 mg BID. Titrate to efficacy as tolerated.   Palliative Prophylaxis:   Aspiration, Bowel Regimen, Delirium Protocol and Frequent Pain Assessment  Psycho-social/Spiritual:   Desire for further Chaplaincy support:yes  Additional Recommendations: Caregiving  Support/Resources  Prognosis:   Unable to determine.  Overall prognosis poor with cirrhosis.   Discharge Planning: Home with Palliative Services      Primary Diagnoses: Present on Admission: . Sepsis ([place])   I have reviewed the medical record, interviewed the patient and family, and examined the patient. The following aspects are pertinent.  Past Medical History:  Diagnosis Date  . Anemia   . Cancer ([place])   . Cirrhosis ([place])   . Hypertension    Social History   Socioeconomic History  . Marital status: Married    Spouse name: [name]  . Number of children: 1  . Years of education: 51  . Highest education level: 9th grade  Occupational History  . Occupation: disabled  Social Needs  . Financial resource strain: Somewhat  hard  . Food insecurity    Worry: Sometimes true    Inability: Sometimes true  . Transportation needs    Medical: No    Non-medical: No  Tobacco Use  . Smoking status: Current Every Day Smoker  . Smokeless tobacco: Never Used  Substance and Sexual Activity  . Alcohol use: Not Currently  . Drug use: Never  . Sexual activity: Not Currently  Lifestyle  . Physical activity    Days per week: 0 days    Minutes per session: 0 min  . Stress: Only a little  Relationships  . Social Herbalist on phone: Once a week    Gets together: Never    Attends religious service: Never    Active member of club or organization: No    Attends meetings of clubs or organizations: Never    Relationship status: Married  Other Topics Concern  . Not on file  Social History Narrative  . Not on file   Family History  Problem Relation Age of Onset  . Cancer Mother   . Cancer Father    Scheduled Meds: . Chlorhexidine Gluconate Cloth  6 each Topical Daily  . digoxin  0.125 mg Oral BID  . feeding supplement (ENSURE ENLIVE)  237 mL Oral BID BM  . lactobacillus  1 g Oral TID WC  . midodrine  5 mg Oral TID WC  . multivitamin with minerals  1 tablet Oral Daily  . pantoprazole  40 mg Oral BID  . phosphorus  500 mg Oral Q4H  . sulfamethoxazole-trimethoprim  1 tablet Oral Daily  . [START ON 06/26/2019] vancomycin  125 mg Oral Daily   Continuous Infusions: PRN Meds:.baclofen, HYDROmorphone (DILAUDID) injection, ondansetron **OR** ondansetron (ZOFRAN) IV No Known Allergies Review of Systems  Constitutional: Positive for activity change, appetite change and fatigue.  Respiratory: Negative for shortness of breath.   Gastrointestinal: Positive for abdominal distention and abdominal pain.  Neurological: Positive for weakness.    Physical Exam Vitals signs and nursing note reviewed.  Constitutional:      General: She is not in acute distress.    Appearance: She is ill-appearing.   Cardiovascular:     Rate and Rhythm: Tachycardia present.  Pulmonary:     Effort: Pulmonary effort is normal. No tachypnea, accessory muscle usage or respiratory distress.  Abdominal:     General: There is distension.  Neurological:     Mental Status: She is alert and oriented to person, place, and time.  Psychiatric:        Mood and Affect: Mood is depressed. Affect is tearful.        Thought Content: Thought content does not include suicidal ideation. Thought content does not include suicidal plan.  Vital Signs: BP 92/65   Pulse 100   Temp 98.4 F (36.9 C) (Oral)   Resp 16   Ht 5' 8.5" (1.74 m)   Wt 76.1 kg   SpO2 97%   BMI 25.14 kg/m  Pain Scale: 0-10 POSS *See Group Information*: 1-Acceptable,Awake and alert Pain Score: 4    SpO2: SpO2: 97 % O2 Device:SpO2: 97 % O2 Flow Rate: .   IO: Intake/output summary: No intake or output data in the 24 hours ending 06/25/19 1154  LBM: Last BM Date: 06/23/19 Baseline Weight: Weight: 74.8 kg Most recent weight: Weight: 76.1 kg     Palliative Assessment/Data:     Time In: 1040 Time Out: 1155 Time Total: 75 min Greater than 50%  of this time was spent counseling and coordinating care related to the above assessment and plan.  Signed by: Vinie Sill, NP Palliative Medicine Team Pager # [phone] (M-F 8a-5p) Team Phone # [phone] (Nights/Weekends)

## 2019-06-26 ENCOUNTER — Inpatient Hospital Stay: Payer: Medicare Other

## 2019-06-26 ENCOUNTER — Telehealth: Payer: Self-pay

## 2019-06-26 DIAGNOSIS — Z7189 Other specified counseling: Secondary | ICD-10-CM

## 2019-06-26 DIAGNOSIS — Z515 Encounter for palliative care: Secondary | ICD-10-CM

## 2019-06-26 LAB — BASIC METABOLIC PANEL
Anion gap: 5 (ref 5–15)
BUN: 5 mg/dL — ABNORMAL LOW (ref 6–20)
CO2: 26 mmol/L (ref 22–32)
Calcium: 8 mg/dL — ABNORMAL LOW (ref 8.9–10.3)
Chloride: 103 mmol/L (ref 98–111)
Creatinine, Ser: 0.5 mg/dL (ref 0.44–1.00)
GFR calc Af Amer: >60 mL/min (ref >60)
GFR calc non Af Amer: >60 mL/min (ref >60)
Glucose, Bld: 125 mg/dL — ABNORMAL HIGH (ref 70–99)
Potassium: 4 mmol/L (ref 3.5–5.1)
Sodium: 134 mmol/L — ABNORMAL LOW (ref 135–145)

## 2019-06-26 LAB — PHOSPHORUS: Phosphorus: 3.2 mg/dL (ref 2.5–4.6)

## 2019-06-26 LAB — CYTOLOGY - NON PAP

## 2019-06-26 LAB — MAGNESIUM: Magnesium: 1.7 mg/dL (ref 1.7–2.4)

## 2019-06-26 IMAGING — US US PARACENTESIS
1 series · 9 of 9 positions shown · non-contrast
Comparison: none

INDICATION: 57-year-old with decompensated cirrhosis and ascites.

[Series 1: us paracentesis · 0.28mm/px · 9 of 9 slices shown]
[im 1/9]
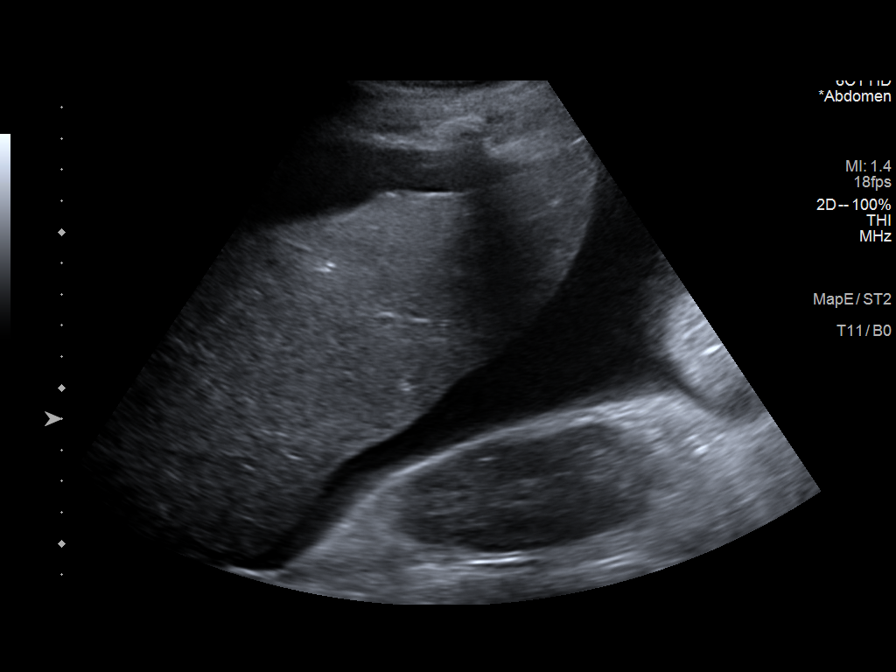
[im 2/9]
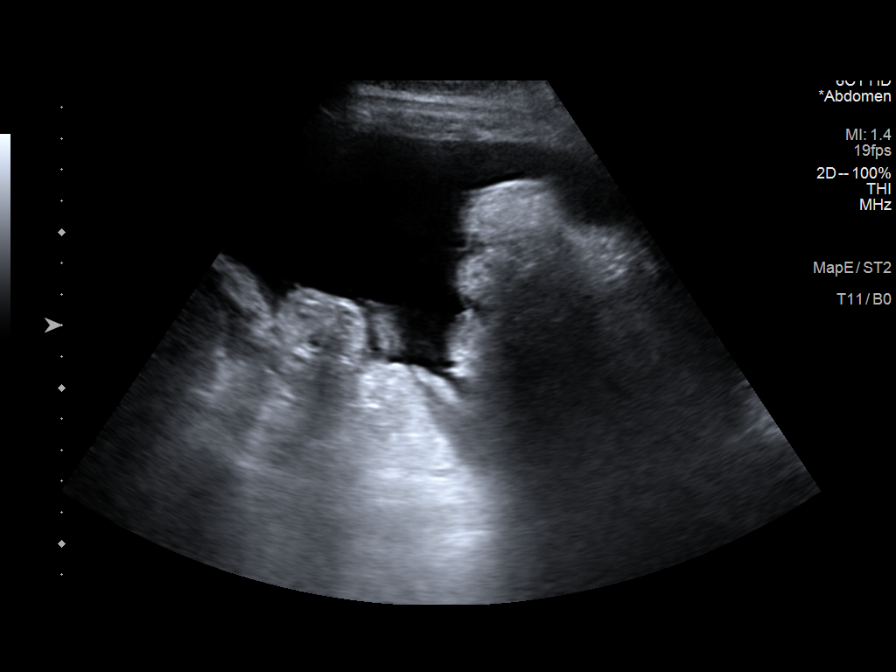
[im 3/9]
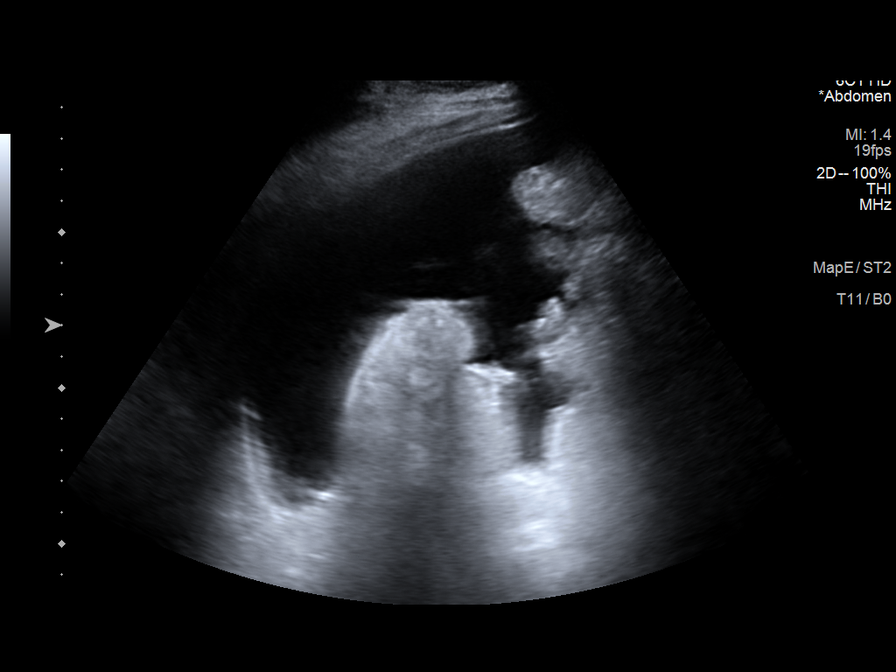
[im 4/9]
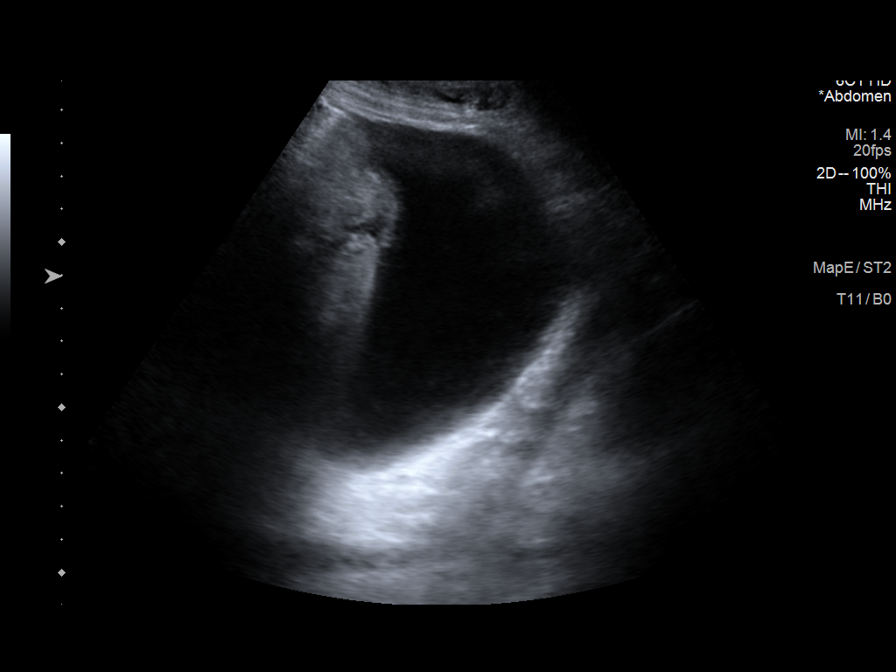
[im 5/9]
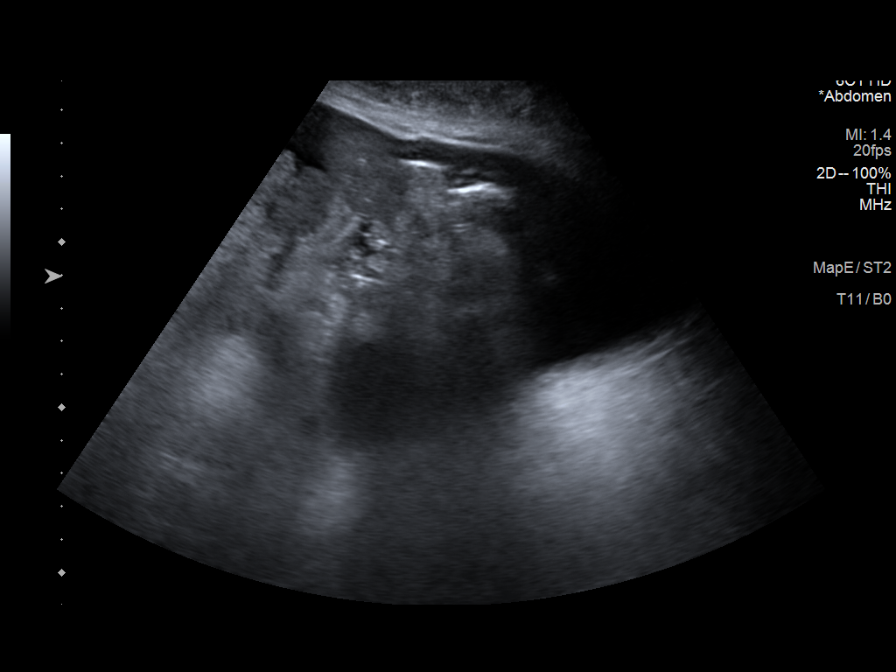
[im 6/9]
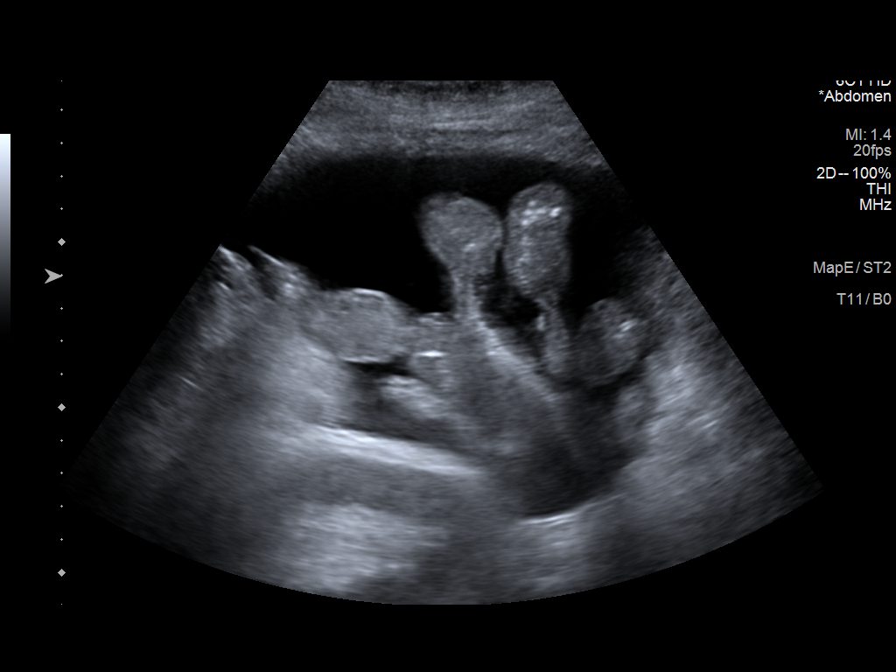
[im 7/9]
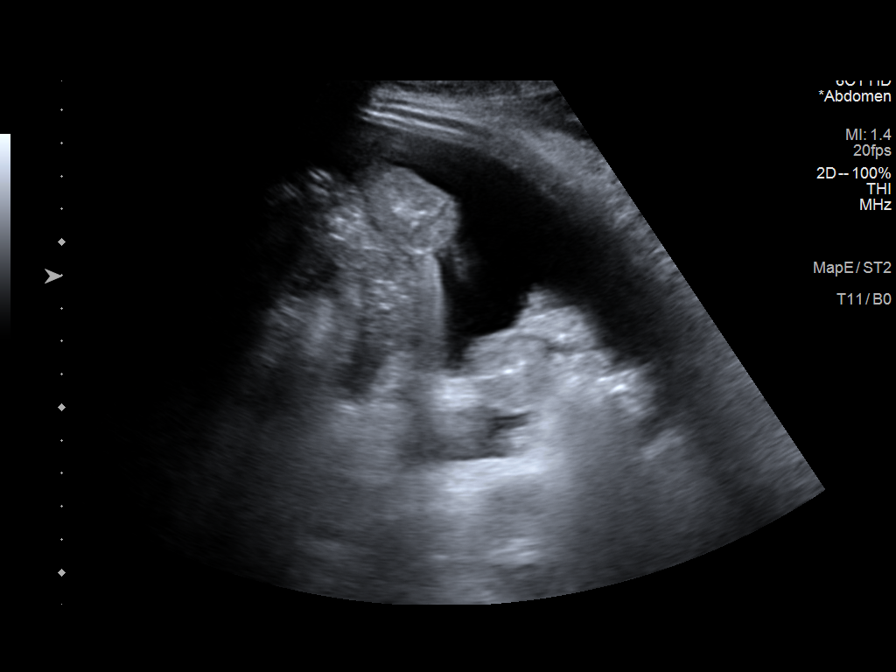
[im 8/9]
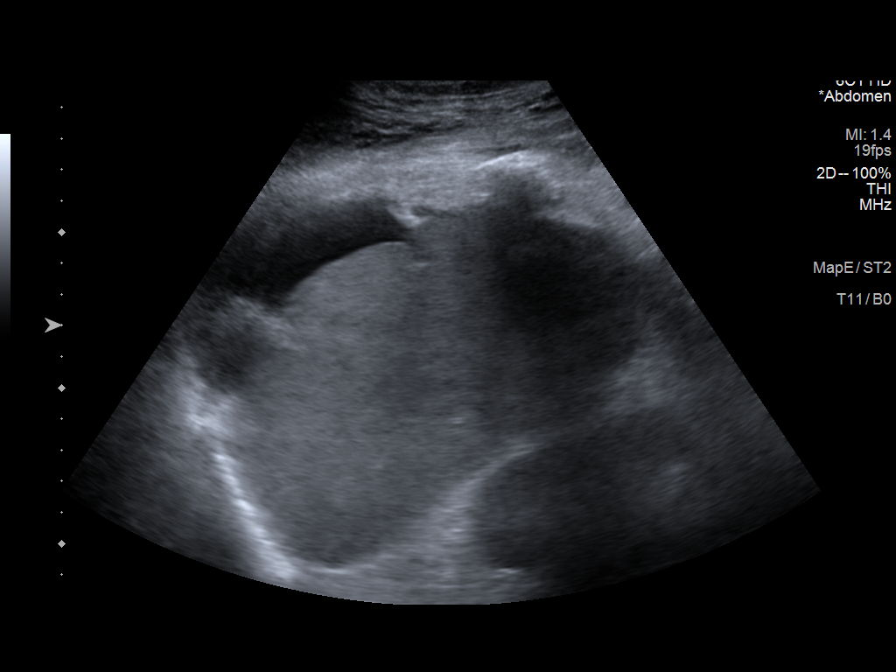
[im 9/9]
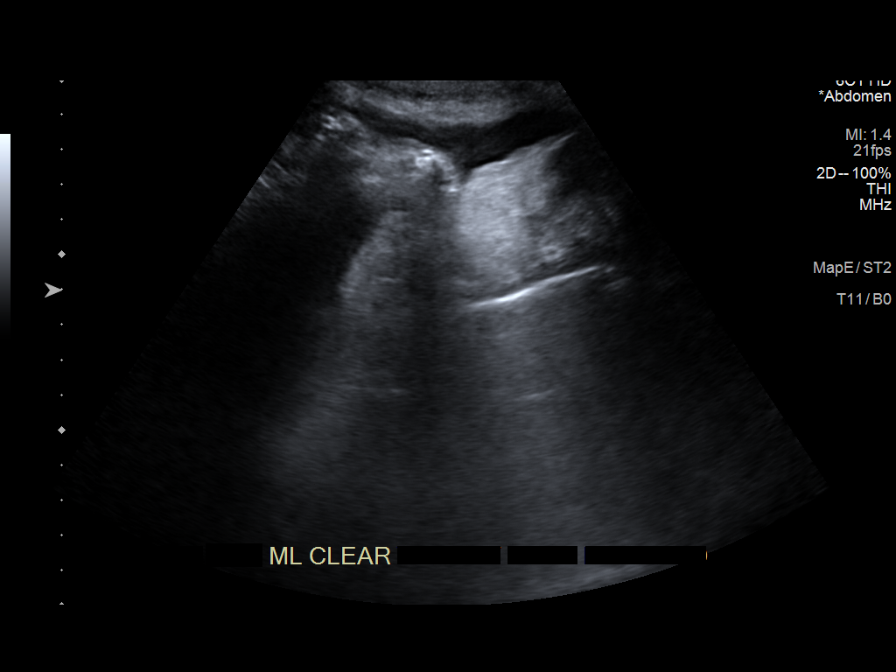

[9 of 9 positions shown; findings below may reference images not displayed]

EXAM:
ULTRASOUND GUIDED PARACENTESIS

MEDICATIONS:
None.

COMPLICATIONS:
None immediate.

PROCEDURE:
Informed written consent was obtained from the patient after a
discussion of the risks, benefits and alternatives to treatment. A
timeout was performed prior to the initiation of the procedure.

Initial ultrasound scanning demonstrates a large amount of ascites
within the right lower abdominal quadrant. The right lower abdomen
was prepped and draped in the usual sterile fashion. 1% lidocaine
was used for local anesthesia.

Following this, a 6 Fr Safe-T-Centesis catheter was introduced. An
ultrasound image was saved for documentation purposes. The
paracentesis was performed. The catheter was removed and a dressing
was applied. The patient tolerated the procedure well without
immediate post procedural complication.
FINDINGS: A total of approximately 3.9 L of yellow fluid was removed.
IMPRESSION: Successful ultrasound-guided paracentesis yielding 3.9 liters of
peritoneal fluid.

## 2019-06-26 MED ORDER — SODIUM CHLORIDE 0.9 % IV BOLUS
250.0000 mL | Freq: Once | INTRAVENOUS | Status: AC
  Administered 2019-06-26: 250 mL via INTRAVENOUS

## 2019-06-26 MED ORDER — OXYCODONE HCL 5 MG PO TABS
5.0000 mg | ORAL_TABLET | Freq: Once | ORAL | Status: AC
  Administered 2019-06-26: 09:00:00 5 mg via ORAL
  Filled 2019-06-26: qty 1

## 2019-06-26 MED ORDER — DIGOXIN 125 MCG PO TABS
0.1250 mg | ORAL_TABLET | Freq: Every day | ORAL | Status: DC
  Filled 2019-06-26: qty 1

## 2019-06-26 MED ORDER — METOPROLOL SUCCINATE ER 25 MG PO TB24
12.5000 mg | ORAL_TABLET | Freq: Every day | ORAL | Status: DC
  Administered 2019-06-26: 13:00:00 12.5 mg via ORAL
  Filled 2019-06-26: qty 1

## 2019-06-26 MED ORDER — MAGNESIUM SULFATE 2 GM/50ML IV SOLN
2.0000 g | Freq: Once | INTRAVENOUS | Status: AC
  Administered 2019-06-26: 09:00:00 2 g via INTRAVENOUS
  Filled 2019-06-26: qty 50

## 2019-06-26 MED ORDER — OXYCODONE HCL 5 MG PO TABS
5.0000 mg | ORAL_TABLET | Freq: Four times a day (QID) | ORAL | Status: DC | PRN
  Administered 2019-06-26 – 2019-06-28 (×8): 5 mg via ORAL
  Filled 2019-06-26 (×8): qty 1

## 2019-06-26 NOTE — Progress Notes (Signed)
Palliative:  HPI: 58 y.o. female  with past medical history of alcoholic liver cirrhosis, HTN, lymphoma, recent h/o C. Diff colitis, GIB, SBP admitted on 06/22/2019 with low potassium and magnesium, hypotension, tachycardia. Electrolytes replaced and stable. Continues to have tachycardia with atrial fibrillation.   I met again today with Anita Reed along with NP student Anita Reed at bedside. Anita Reed is tearful again today and is having pain. I believe a lot of her distress is existential. She does not have good support and does not feel supported by her husband. She is very much struggling with her disease process and prognosis. She is going through the stages of grief with anger and sadness. Today she tells [place] that she would like to continue fighting as much as she can but continues to have limits (DNR) and is not sure how much fight she really has in her.   Most of our conversation was spent listening and offering reassurance of her feelings. Plans for outpatient palliative to assist and support. She would benefit from seeing a therapist but I am unsure that she would be willing to do so. I have forwarded my original consult note to her PCP for review and update.   All questions/concerns addressed. Emotional support provided.   Exam: Alert, oriented. Tearful. No distress. Breathing regular, unlabored. Generalized weakness.   Plan: - Home soon with outpatient palliative to follow.  - Depression: Fluoxetine 10 mg daily beginning 06/25/19.  - BLE neuropathy: Gabapentin 100 mg BID. Titrate to efficacy as tolerated.  [place], NP Palliative Medicine Team Pager [phone] (Please see [email or web address] for schedule) Team Phone [phone]    Greater than 50%  of this time was spent counseling and coordinating care related to the above assessment and plan

## 2019-06-26 NOTE — Progress Notes (Addendum)
Progress Note  Patient Name: Anita Reed Date of Encounter: 06/26/2019  Primary Cardiologist: [facility]  Subjective   Patient is eager to go home but agreeable to remain in the hospital for further attempt at rate control.  She denies any racing heart rate or palpitations; however, she notes that she is in a lot of pain all over her body at this time.  Inpatient Medications    Scheduled Meds: . Chlorhexidine Gluconate Cloth  6 each Topical Daily  . digoxin  0.125 mg Oral BID  . feeding supplement (ENSURE ENLIVE)  237 mL Oral BID BM  . FLUoxetine  10 mg Oral Daily  . furosemide  40 mg Oral Daily  . gabapentin  100 mg Oral BID  . lactobacillus  1 g Oral TID WC  . metoprolol succinate  12.5 mg Oral Daily  . midodrine  5 mg Oral TID WC  . multivitamin with minerals  1 tablet Oral Daily  . pantoprazole  40 mg Oral BID  . spironolactone  50 mg Oral Daily  . sulfamethoxazole-trimethoprim  1 tablet Oral Daily   Continuous Infusions: . albumin human     PRN Meds: baclofen, HYDROmorphone (DILAUDID) injection, ondansetron **OR** ondansetron (ZOFRAN) IV, oxyCODONE   Vital Signs    Vitals:   06/26/19 0510 06/26/19 0645 06/26/19 0752 06/26/19 1000  BP: (!) 81/51 (!) 81/63 96/67 97/65   Pulse: (!) 120 (!) 120 (!) 117 (!) 120  Resp: 18  18 20   Temp: 98.1 F (36.7 C)  98.3 F (36.8 C) 98.1 F (36.7 C)  TempSrc: Oral  Oral Oral  SpO2: 96% 98% 98% 97%  Weight:      Height:       No intake or output data in the 24 hours ending 06/26/19 1350 Last 3 Weights 06/24/2019 06/22/2019 06/22/2019  Weight (lbs) 167 lb 12.3 oz 168 lb 165 lb  Weight (kg) 76.1 kg 76.204 kg 74.844 kg      Telemetry    Atrial flutter with ventricular rates in the low 100s to 130s- Personally Reviewed  ECG    No new tracings- Personally Reviewed  Physical Exam   GEN: No acute distress, though does report a significant amount of pain all over her body.   Neck: No JVD Cardiac:  IR IR, tachycardic,  no murmurs, rubs, or gallops.  Respiratory:  Coarse breath sounds bilaterally, bilateral wheezing, trace bibasilar crackles. GI:  Deferred due to patient's reported pain.  Bowel sounds normal on auscultation. MS: No edema; No deformity. Neuro:  Nonfocal  Psych: Normal affect   Labs    High Sensitivity Troponin:   Recent Labs  Lab 06/01/19 1251 06/01/19 1926  TROPONINIHS 12 15      Cardiac EnzymesNo results for input(s): TROPONINI in the last 168 hours. No results for input(s): TROPIPOC in the last 168 hours.   Chemistry Recent Labs  Lab 06/23/19 0522  06/24/19 0511 06/25/19 0418 06/26/19 0337  NA 132*  --  133* 135 134*  K 2.8*   < > 4.1 4.7 4.0  CL 97*  --  102 104 103  CO2 24  --  21* 25 26  GLUCOSE 102*  --  104* 98 125*  BUN 6  --  <5* <5* 5*  CREATININE 0.56  --  0.50 0.37* 0.50  CALCIUM 7.8*  --  8.0* 8.2* 8.0*  PROT 4.9*  --  4.6* 4.5*  --   ALBUMIN 2.7*  --  2.5* 2.4*  --  AST 36  --  38 36  --   ALT 17  --  15 15  --   ALKPHOS 385*  --  367* 411*  --   BILITOT 1.3*  --  1.4* 1.3*  --   GFRNONAA >60  --  >60 >60 >60  GFRAA >60  --  >60 >60 >60  ANIONGAP 11  --  10 6 5    < > = values in this interval not displayed.     Hematology Recent Labs  Lab 06/22/19 1600 06/23/19 0522 06/24/19 0511 06/25/19 0418  WBC 12.3* 10.7* 11.0*  --   RBC 4.20 3.35* 3.58*  --   HGB 10.4* 8.2* 8.9* 8.3*  HCT 33.2* 26.5* 28.1*  --   MCV 79.0* 79.1* 78.5*  --   MCH 24.8* 24.5* 24.9*  --   MCHC 31.3 30.9 31.7  --   RDW 19.1* 19.0* 19.3*  --   PLT 222 177 180  --     BNPNo results for input(s): BNP, PROBNP in the last 168 hours.   DDimer No results for input(s): DDIMER in the last 168 hours.   Radiology    No results found.  Cardiac Studies   Echo 01/19/19 1. The left ventricle has normal systolic function, with an ejection fraction of 55-60%. The cavity size was normal. Left ventricular diastolic function could not be evaluated. No evidence of left ventricular  regional wall motion abnormalities.  2. The right ventricle has normal systolic function. The cavity was not assessed. There is no increase in right ventricular wall thickness. Right ventricular systolic pressure is mildly elevated with an estimated pressure of 35.0 mmHg.  3. Left atrial size was mildly dilated.  4. The mitral valve is degenerative. Mild thickening of the mitral valve leaflet. There is moderate mitral annular calcification present. No evidence of mitral valve stenosis.  5. The tricuspid valve is grossly normal. Tricuspid valve regurgitation is mild-moderate.  6. The aortic valve is tricuspid. Moderate thickening of the aortic valve. Mild calcification of the aortic valve.  7. The aortic root is normal in size and structure.  8. The inferior vena cava was dilated in size with <50% respiratory variability.  9. Underlying rhythm appears to be atrial flutter.  Patient Profile     58 y.o. female with hx of C. difficile colitis, portal hypertension, portal hypertensive gastropathy, spontaneous bacterial peritonitis, hepatic encephalopathy, decompensated alcoholic cirrhosis, esophageal varices, ascites, current smoker, no known past medical history of heart failure or arrhythmia, and newly diagnosed atrial flutter who is being seen today for the evaluation of new onset atrial flutter in the setting of hypokalemia.  Assessment & Plan    Paroxysmal atrial flutter with RVR --Relatively asymptomatic in atrial flutter.  Rate control suboptimal with ventricular rates into the 130s today. 12/2018 echo as above with nl EF and degenerative MV, as well as dilated LA. --Plan for rate control only at this time. Continue digoxin (with current renal function stable). Daily BMET. Started on low dose BB for further rate control. Rate control has been complicated by hypotension this admission. Amiodarone not recommended at this time, given pharmacologic conversion still carries risk of thrombosis and not  therapeutically anticoagulated. Discussed TEE/DCC versus DCCV with the patient and in the future if rate control remains difficult and if also able to therapeutically anticoagulate in the future. Currently, not on anticoagulation due to risk of bleeding in the setting of known cirrhosis and esophageal varices. Followed by GI this admission and  as an outpatient. Of note, CHA2DS2VASc score of at least 3 (age, vascular, female).   History of GI bleed Liver cirrhosis Esophageal varices  --Last Hgb 8.3.  Recommend transfusion for hemoglobin below 7.  --Daily CBC.  --She is s/p recent 10/23 paracentesis by surgery with closure due to slow leak yesterday.  --Per GI note, she does not yet meet criteria for liver transplant.  --Per GI, started on gentle diuresis and this is reasonable to continue with close monitoring of renal function.  She is also on midodrine with recommendation to continue as an outpatient.  Of note, she is followed by palliative care.  Recent Cdiff --Continue antibiotics. Daily BMET as above.  For questions or updates, please contact [place] Please consult [email or web address] for contact info under        Signed, Arvil Chaco, PA-C  06/26/2019, 1:50 PM

## 2019-06-26 NOTE — Procedures (Signed)
Interventional Radiology Procedure:   Indications: Ascites  Procedure: US guided paracentesis  Findings: Removed 3900 ml from RLQ quadrant  Complications: None     EBL: Less than 10 ml   Tomika Eckles R. Anselm Pancoast, MD  Pager: [phone]

## 2019-06-26 NOTE — Progress Notes (Signed)
New referral for  [facility] palliative program to follow at home post discharge received from Palliative NP Vinie Sill. CMRN Doran Clay made aware. Patient information given to referral. Flo Shanks BSN, RN, [place] [phone]

## 2019-06-26 NOTE — Consult Note (Signed)
PHARMACY CONSULT NOTE - FOLLOW UP  Pharmacy Consult for Electrolyte Monitoring and Replacement   Recent Labs: Potassium (mmol/L)  Date Value  06/26/2019 4.0   Magnesium (mg/dL)  Date Value  06/26/2019 1.7   Calcium (mg/dL)  Date Value  06/26/2019 8.0 (L)   Albumin (g/dL)  Date Value  06/25/2019 2.4 (L)   Phosphorus (mg/dL)  Date Value  06/26/2019 3.2   Sodium (mmol/L)  Date Value  06/26/2019 134 (L)     Assessment: Pharmacy was consulted for electrolyte management.   K 4.0, Mag 1.7  Goal of Therapy:  Electrolytes WNL  Plan:  Mag sulfate 2 g IV x1  Will recheck w/ am labs.  Pharmacy will continue to follow.   Rowland Lathe, PharmD Clinical Pharmacist 06/26/2019 7:20 AM

## 2019-06-26 NOTE — Progress Notes (Signed)
Anita Darby, MD [address]  [place]  [place], [place] [address]  Main: [phone]  Fax: [phone] Pager: [phone]   Subjective: Heart rate still rises with physical activity.  Diuresing well.  Blood pressure has been maintained as expected.  She reports feeling better.  Abdomen less distended   Objective: Vital signs in last 24 hours: Vitals:   06/26/19 0645 06/26/19 0752 06/26/19 1000 06/26/19 1417  BP: (!) 81/63 96/67 97/65  110/63  Pulse: (!) 120 (!) 117 (!) 120 (!) 118  Resp:  18 20 18   Temp:  98.3 F (36.8 C) 98.1 F (36.7 C) 98.1 F (36.7 C)  TempSrc:  Oral Oral   SpO2: 98% 98% 97% 99%  Weight:      Height:       Weight change:  No intake or output data in the 24 hours ending 06/26/19 1609   Exam: Heart:: Tachycardia, regular rhythm Lungs: normal and clear to auscultation Abdomen: Soft, mildly distended, nontender   Lab Results: CBC Latest Ref Rng & Units 06/25/2019 06/24/2019 06/23/2019  WBC 4.0 - 10.5 K/uL - 11.0(H) 10.7(H)  Hemoglobin 12.0 - 15.0 g/dL 8.3(L) 8.9(L) 8.2(L)  Hematocrit 36.0 - 46.0 % - 28.1(L) 26.5(L)  Platelets 150 - 400 K/uL - 180 177   CMP Latest Ref Rng & Units 06/26/2019 06/25/2019 06/24/2019  Glucose 70 - 99 mg/dL 125(H) 98 104(H)  BUN 6 - 20 mg/dL 5(L) <5(L) <5(L)  Creatinine 0.44 - 1.00 mg/dL 0.50 0.37(L) 0.50  Sodium 135 - 145 mmol/L 134(L) 135 133(L)  Potassium 3.5 - 5.1 mmol/L 4.0 4.7 4.1  Chloride 98 - 111 mmol/L 103 104 102  CO2 22 - 32 mmol/L 26 25 21(L)  Calcium 8.9 - 10.3 mg/dL 8.0(L) 8.2(L) 8.0(L)  Total Protein 6.5 - 8.1 g/dL - 4.5(L) 4.6(L)  Total Bilirubin 0.3 - 1.2 mg/dL - 1.3(H) 1.4(H)  Alkaline Phos 38 - 126 U/L - 411(H) 367(H)  AST 15 - 41 U/L - 36 38  ALT 0 - 44 U/L - 15 15    Micro Results: Recent Results (from the past 240 hour(s))  Blood culture (routine x 2)     Status: None (Preliminary result)   Collection Time: 06/22/19  4:20 PM   Specimen: BLOOD  Result Value Ref Range  Status   Specimen Description BLOOD BLOOD RIGHT ARM  Final   Special Requests   Final    BOTTLES DRAWN AEROBIC AND ANAEROBIC Blood Culture adequate volume   Culture   Final    NO GROWTH 4 DAYS Performed at [facility], [address] [place], [place] [address]    Report Status PENDING  Incomplete  Blood culture (routine x 2)     Status: None (Preliminary result)   Collection Time: 06/22/19  4:20 PM   Specimen: BLOOD  Result Value Ref Range Status   Specimen Description BLOOD BLOOD LEFT HAND  Final   Special Requests   Final    BOTTLES DRAWN AEROBIC AND ANAEROBIC Blood Culture adequate volume   Culture   Final    NO GROWTH 4 DAYS Performed at [facility], [address], [place],  [address]    Report Status PENDING  Incomplete  SARS Coronavirus 2 by RT PCR (hospital order, performed in [place] hospital lab) Nasopharyngeal Nasopharyngeal Swab     Status: None   Collection Time: 06/22/19  4:40 PM   Specimen: Nasopharyngeal Swab  Result Value Ref Range Status   SARS Coronavirus  2 NEGATIVE NEGATIVE Final    Comment: (NOTE) If result is NEGATIVE SARS-CoV-2 target nucleic acids are NOT DETECTED. The SARS-CoV-2 RNA is generally detectable in upper and lower  respiratory specimens during the acute phase of infection. The lowest  concentration of SARS-CoV-2 viral copies this assay can detect is 250  copies / mL. A negative result does not preclude SARS-CoV-2 infection  and should not be used as the sole basis for treatment or other  patient management decisions.  A negative result may occur with  improper specimen collection / handling, submission of specimen other  than nasopharyngeal swab, presence of viral mutation(s) within the  areas targeted by this assay, and inadequate number of viral copies  (<250 copies / mL). A negative result must be combined with clinical  observations, patient history, and epidemiological information. If result is POSITIVE  SARS-CoV-2 target nucleic acids are DETECTED. The SARS-CoV-2 RNA is generally detectable in upper and lower  respiratory specimens dur ing the acute phase of infection.  Positive  results are indicative of active infection with SARS-CoV-2.  Clinical  correlation with patient history and other diagnostic information is  necessary to determine patient infection status.  Positive results do  not rule out bacterial infection or co-infection with other viruses. If result is PRESUMPTIVE POSTIVE SARS-CoV-2 nucleic acids MAY BE PRESENT.   A presumptive positive result was obtained on the submitted specimen  and confirmed on repeat testing.  While 2019 novel coronavirus  (SARS-CoV-2) nucleic acids may be present in the submitted sample  additional confirmatory testing may be necessary for epidemiological  and / or clinical management purposes  to differentiate between  SARS-CoV-2 and other Sarbecovirus currently known to infect humans.  If clinically indicated additional testing with an alternate test  methodology [phone]) is advised. The SARS-CoV-2 RNA is generally  detectable in upper and lower respiratory sp ecimens during the acute  phase of infection. The expected result is Negative. Fact Sheet for Patients:  StrictlyIdeas.no Fact Sheet for Healthcare Providers: BankingDealers.co.za This test is not yet approved or cleared by the [place] FDA and has been authorized for detection and/or diagnosis of SARS-CoV-2 by FDA under an Emergency Use Authorization (EUA).  This EUA will remain in effect (meaning this test can be used) for the duration of the COVID-19 declaration under Section 564(b)(1) of the Act, 21 U.S.C. section 360bbb-3(b)(1), unless the authorization is terminated or revoked sooner. Performed at [facility], [address] [place], [place] [address]   Urine Culture     Status: None   Collection Time: 06/22/19   6:09 PM   Specimen: Urine, Random  Result Value Ref Range Status   Specimen Description   Final    URINE, RANDOM Performed at [facility], [address] [place], [place] [address]    Special Requests   Final    Normal Performed at [facility], [address] [place], [place] [address]    Culture   Final    NO GROWTH Performed at [facility], [place] [address] [place], [place] [address]    Report Status 06/24/2019 FINAL  Final  Body fluid culture ( includes gram stain)     Status: None (Preliminary result)   Collection Time: 06/22/19  6:44 PM   Specimen: Peritoneal Cavity; Peritoneal Fluid  Result Value Ref Range Status   Specimen Description   Final    PERITONEAL CAVITY Performed at [facility], [address], [place], [place] [address]  Special Requests   Final    Normal Performed at [place], [place] [address]    Gram Stain   Final    WBC PRESENT,BOTH PMN AND MONONUCLEAR NO ORGANISMS SEEN CYTOSPIN SMEAR    Culture   Final    NO GROWTH 3 DAYS Performed at [facility], [place] [address] [place], [place] [address]    Report Status PENDING  Incomplete  MRSA PCR Screening     Status: None   Collection Time: 06/23/19  4:22 AM   Specimen: Nasopharyngeal  Result Value Ref Range Status   MRSA by PCR NEGATIVE NEGATIVE Final    Comment:        The GeneXpert MRSA Assay (FDA approved for NASAL specimens only), is one component of a comprehensive MRSA colonization surveillance program. It is not intended to diagnose MRSA infection nor to guide or monitor treatment for MRSA infections. Performed at [facility], [place] [place], [place] [address]   Body fluid culture with gram stain     Status: None (Preliminary result)   Collection Time: 06/23/19  1:46 PM   Specimen: Peritoneal Washings; Body Fluid  Result Value Ref Range Status   Specimen Description   Final     PERITONEAL FLUID Performed at [facility], [address] [place], [place] [address]    Special Requests   Final    NONE Performed at [facility], [address] [place], [place] [address]    Gram Stain   Final    RARE WBC PRESENT, PREDOMINANTLY MONONUCLEAR NO ORGANISMS SEEN    Culture   Final    NO GROWTH 2 DAYS Performed at [facility], [place] [address], [place], [place] [address]    Report Status PENDING  Incomplete   Studies/Results: No results found. Medications:  I have reviewed the patient's current medications. Prior to Admission:  Medications Prior to Admission  Medication Sig Dispense Refill Last Dose  . furosemide (LASIX) 40 MG tablet Take 40 mg by mouth 2 (two) times daily.   Past Week at Unknown time  . pantoprazole (PROTONIX) 40 MG tablet Take 1 tablet (40 mg total) by mouth 2 (two) times daily. 60 tablet 11 Past Week at Unknown time  . traMADol (ULTRAM) 50 MG tablet Take 1 tablet (50 mg total) by mouth 2 (two) times daily for 7 days. 14 tablet 0   . baclofen (LIORESAL) 10 MG tablet Take 1 tablet (10 mg total) by mouth 3 (three) times daily. Muscle Relaxer 60 each 0    Scheduled: . Chlorhexidine Gluconate Cloth  6 each Topical Daily  . digoxin  0.125 mg Oral BID  . feeding supplement (ENSURE ENLIVE)  237 mL Oral BID BM  . FLUoxetine  10 mg Oral Daily  . furosemide  40 mg Oral Daily  . gabapentin  100 mg Oral BID  . lactobacillus  1 g Oral TID WC  . metoprolol succinate  12.5 mg Oral Daily  . midodrine  5 mg Oral TID WC  . multivitamin with minerals  1 tablet Oral Daily  . pantoprazole  40 mg Oral BID  . spironolactone  50 mg Oral Daily  . sulfamethoxazole-trimethoprim  1 tablet Oral Daily   Continuous:  [id number], HYDROmorphone (DILAUDID) injection, ondansetron **OR** ondansetron (ZOFRAN) IV, oxyCODONE Anti-infectives (From admission, onward)   Start     Dose/Rate Route Frequency Ordered Stop   06/26/19 1000  vancomycin  (VANCOCIN) 50  mg/mL oral solution 125 mg  Status:  Discontinued    Note to Pharmacy: Stop vanc if 14 day course is done from the start date   thx   125 mg Oral Daily 06/25/19 0939 06/25/19 1448   06/24/19 1500  vancomycin (VANCOCIN) 50 mg/mL oral solution 125 mg  Status:  Discontinued     125 mg Oral Daily 06/24/19 1315 06/25/19 0939   06/24/19 1400  sulfamethoxazole-trimethoprim (BACTRIM DS) 800-160 MG per tablet 1 tablet     1 tablet Oral Daily 06/24/19 1326     06/24/19 1330  sulfamethoxazole-trimethoprim (BACTRIM) 400-80 MG per tablet 1 tablet  Status:  Discontinued     1 tablet Oral Daily 06/24/19 1315 06/24/19 1326   06/23/19 0800  cefTRIAXone (ROCEPHIN) 2 g in sodium chloride 0.9 % 100 mL IVPB  Status:  Discontinued     2 g 200 mL/hr over 30 Minutes Intravenous Every 24 hours 06/22/19 1919 06/24/19 1307   06/22/19 2000  metroNIDAZOLE (FLAGYL) IVPB 500 mg  Status:  Discontinued     500 mg 100 mL/hr over 60 Minutes Intravenous Every 8 hours 06/22/19 1915 06/24/19 1308   06/22/19 1800  cefTRIAXone (ROCEPHIN) 2 g in sodium chloride 0.9 % 100 mL IVPB     2 g 200 mL/hr over 30 Minutes Intravenous  Once 06/22/19 1749 06/22/19 1938     Scheduled Meds: . Chlorhexidine Gluconate Cloth  6 each Topical Daily  . digoxin  0.125 mg Oral BID  . feeding supplement (ENSURE ENLIVE)  237 mL Oral BID BM  . FLUoxetine  10 mg Oral Daily  . furosemide  40 mg Oral Daily  . gabapentin  100 mg Oral BID  . lactobacillus  1 g Oral TID WC  . metoprolol succinate  12.5 mg Oral Daily  . midodrine  5 mg Oral TID WC  . multivitamin with minerals  1 tablet Oral Daily  . pantoprazole  40 mg Oral BID  . spironolactone  50 mg Oral Daily  . sulfamethoxazole-trimethoprim  1 tablet Oral Daily   Continuous Infusions: PRN Meds:.baclofen, HYDROmorphone (DILAUDID) injection, ondansetron **OR** ondansetron (ZOFRAN) IV, oxyCODONE   Assessment: Active Problems:   Sepsis ([place])  Decompensated cirrhosis of liver  complicated by SBP Also, C. difficile colitis on vancomycin Tolerating diuretics well  Plan: Ascites; with history of SBP in 05/2019, currently no evidence of SBP Continue Bactrim DS long-term for SBP prophylaxis Continue midodrine 5 mg 3 times daily as outpatient Continue Lasix 40 mg and spironolactone 50 mg daily with close monitoring of renal function and electrolytes, patient is responding well to diuresis Low-sodium diet Paracentesis site has been sutured by surgery, appreciate their help  History of C. difficile colitis in 05/2019 Patient finished oral vancomycin for 2 weeks total  A. fib with RVR: Remains tachycardic, cardiology recommends digoxin 0.125 mg twice daily.  May need ablation in future if not controlled with medical therapy Not a candidate for anticoagulation at this time as risks overweigh benefits of developing GI bleed Appreciate cardiology input  History of esophageal varices Variceal surveillance as outpatient Patient may not tolerate nonselective beta-blocker due to labile blood pressure and A. fib with RVR  Liver transplant candidacy Patient's current MELD-Na is only 16.  She does not meet the criteria for liver transplant yet.  Explained the process to patient.  However, she would like to be referred to [facility] transplant hepatology.  Outpatient referral will be placed Palliative care is following    LOS: 4 days  Laberta Wilbon 06/26/2019, 4:09 PM

## 2019-06-26 NOTE — Progress Notes (Signed)
Provider notified of HR. No new orders at this time. A-fib/ST 120's.

## 2019-06-26 NOTE — Telephone Encounter (Signed)
Sent referral to [facility].

## 2019-06-26 NOTE — Progress Notes (Signed)
Md notified of low bp. Orders for 250cc ns bolus. Fluids given, no improvement in bp. Pt asymptomatic. Per md. Ok to give midodrine dose early. dose given and dayshift rn notified.

## 2019-06-26 NOTE — Progress Notes (Signed)
Patient ID: Anita Reed, female   DOB: 1961/05/02, 58 y.o.   MRN: [id number]  [employer] [facility] PROGRESS NOTE  Anita Reed [id number] DOB: 02-01-1961 DOA: 06/22/2019 PCP: Anita Hartshorn, FNP  HPI/Subjective: Patient is still tachycardic and peritoneal fluid is not leaking anymore after stitch was placed by surgery  Objective: Vitals:   06/26/19 1000 06/26/19 1417  BP: 97/65 110/63  Pulse: (!) 120 (!) 118  Resp: 20 18  Temp: 98.1 F (36.7 C) 98.1 F (36.7 C)  SpO2: 97% 99%    Intake/Output Summary (Last 24 hours) at 06/26/2019 1706 Last data filed at 06/26/2019 1705 Gross per 24 hour  Intake 40.52 ml  Output -  Net 40.52 ml   Filed Weights   06/22/19 1530 06/22/19 2029 06/24/19 1500  Weight: 74.8 kg 76.2 kg 76.1 kg    ROS: Review of Systems  Constitutional: Negative for chills and fever.  Eyes: Negative for blurred vision.  Respiratory: Negative for cough and shortness of breath.   Cardiovascular: Negative for chest pain.  Gastrointestinal: Positive for abdominal pain and diarrhea. Negative for constipation, nausea and vomiting.  Genitourinary: Negative for dysuria.  Musculoskeletal: Negative for joint pain.  Neurological: Negative for dizziness and headaches.   Exam: Physical Exam  Constitutional: She is oriented to person, place, and time.  HENT:  Nose: No mucosal edema.  Mouth/Throat: No oropharyngeal exudate or posterior oropharyngeal edema.  Eyes: Pupils are equal, round, and reactive to light. Conjunctivae, EOM and lids are normal.  Neck: No JVD present. Carotid bruit is not present. No edema present. No thyroid mass and no thyromegaly present.  Cardiovascular: S1 normal and S2 normal. Exam reveals no gallop.  No murmur heard. Respiratory: No respiratory distress. She has decreased breath sounds in the right lower field and the left lower field. She has no wheezes. She has no rhonchi. She has no rales.  GI: Soft. Bowel sounds are normal. She exhibits  distension. There is no abdominal tenderness.  Leaking peritoneal fluid from the paracentesis site  Musculoskeletal:     Right ankle: She exhibits swelling.     Left ankle: She exhibits swelling.  Lymphadenopathy:    She has no cervical adenopathy.  Neurological: She is alert and oriented to person, place, and time. No cranial nerve deficit.  Skin: Skin is warm. No rash noted. Nails show no clubbing.  Psychiatric: She has a normal mood and affect.      Data Reviewed: Basic Metabolic Panel: Recent Labs  Lab 06/22/19 1600  06/23/19 0522 06/23/19 1831 06/23/19 2306 06/24/19 0511 06/25/19 0418 06/26/19 0337  NA 131*  --  132*  --   --  133* 135 134*  K 2.5*   < > 2.8* 3.6 3.6 4.1 4.7 4.0  CL 93*  --  97*  --   --  102 104 103  CO2 25  --  24  --   --  21* 25 26  GLUCOSE 117*  --  102*  --   --  104* 98 125*  BUN 8  --  6  --   --  <5* <5* 5*  CREATININE 0.62  --  0.56  --   --  0.50 0.37* 0.50  CALCIUM 8.2*  --  7.8*  --   --  8.0* 8.2* 8.0*  MG 1.5*  --  1.7  --  1.7 1.7 1.7 1.7  PHOS  --   --  2.7  --  2.0* 2.0* 2.1* 3.2   < > =  values in this interval not displayed.   Liver Function Tests: Recent Labs  Lab 06/22/19 1332 06/22/19 1600 06/23/19 0522 06/24/19 0511 06/25/19 0418  AST 44* 45* 36 38 36  ALT 20 19 17 15 15   ALKPHOS 458* 438* 385* 367* 411*  BILITOT 2.2* 2.1* 1.3* 1.4* 1.3*  PROT 5.7* 5.5* 4.9* 4.6* 4.5*  ALBUMIN 3.0* 2.9* 2.7* 2.5* 2.4*   CBC: Recent Labs  Lab 06/22/19 1332 06/22/19 1600 06/23/19 0522 06/24/19 0511 06/25/19 0418  WBC 13.3* 12.3* 10.7* 11.0*  --   NEUTROABS 9.9* 9.5*  --  7.7  --   HGB 10.4* 10.4* 8.2* 8.9* 8.3*  HCT 33.0* 33.2* 26.5* 28.1*  --   MCV 79.5* 79.0* 79.1* 78.5*  --   PLT 223 222 177 180  --    BNP (last 3 results) Recent Labs    01/18/19 1220  BNP 771.0*    CBG: Recent Labs  Lab 06/23/19 0335  GLUCAP 125*    Recent Results (from the past 240 hour(s))  Blood culture (routine x 2)     Status: None  (Preliminary result)   Collection Time: 06/22/19  4:20 PM   Specimen: BLOOD  Result Value Ref Range Status   Specimen Description BLOOD BLOOD RIGHT ARM  Final   Special Requests   Final    BOTTLES DRAWN AEROBIC AND ANAEROBIC Blood Culture adequate volume   Culture   Final    NO GROWTH 4 DAYS Performed at [facility], [address] [place], [place] [address]    Report Status PENDING  Incomplete  Blood culture (routine x 2)     Status: None (Preliminary result)   Collection Time: 06/22/19  4:20 PM   Specimen: BLOOD  Result Value Ref Range Status   Specimen Description BLOOD BLOOD LEFT HAND  Final   Special Requests   Final    BOTTLES DRAWN AEROBIC AND ANAEROBIC Blood Culture adequate volume   Culture   Final    NO GROWTH 4 DAYS Performed at [facility], [address], [place], [place] [address]    Report Status PENDING  Incomplete  SARS Coronavirus 2 by RT PCR (hospital order, performed in [place] hospital lab) Nasopharyngeal Nasopharyngeal Swab     Status: None   Collection Time: 06/22/19  4:40 PM   Specimen: Nasopharyngeal Swab  Result Value Ref Range Status   SARS Coronavirus 2 NEGATIVE NEGATIVE Final    Comment: (NOTE) If result is NEGATIVE SARS-CoV-2 target nucleic acids are NOT DETECTED. The SARS-CoV-2 RNA is generally detectable in upper and lower  respiratory specimens during the acute phase of infection. The lowest  concentration of SARS-CoV-2 viral copies this assay can detect is 250  copies / mL. A negative result does not preclude SARS-CoV-2 infection  and should not be used as the sole basis for treatment or other  patient management decisions.  A negative result may occur with  improper specimen collection / handling, submission of specimen other  than nasopharyngeal swab, presence of viral mutation(s) within the  areas targeted by this assay, and inadequate number of viral copies  (<250 copies / mL). A negative result must be combined  with clinical  observations, patient history, and epidemiological information. If result is POSITIVE SARS-CoV-2 target nucleic acids are DETECTED. The SARS-CoV-2 RNA is generally detectable in upper and lower  respiratory specimens dur ing the acute phase of infection.  Positive  results are indicative of active infection with SARS-CoV-2.  Clinical  correlation with patient history and other diagnostic information is  necessary to determine patient infection status.  Positive results do  not rule out bacterial infection or co-infection with other viruses. If result is PRESUMPTIVE POSTIVE SARS-CoV-2 nucleic acids MAY BE PRESENT.   A presumptive positive result was obtained on the submitted specimen  and confirmed on repeat testing.  While 2019 novel coronavirus  (SARS-CoV-2) nucleic acids may be present in the submitted sample  additional confirmatory testing may be necessary for epidemiological  and / or clinical management purposes  to differentiate between  SARS-CoV-2 and other Sarbecovirus currently known to infect humans.  If clinically indicated additional testing with an alternate test  methodology [phone]) is advised. The SARS-CoV-2 RNA is generally  detectable in upper and lower respiratory sp ecimens during the acute  phase of infection. The expected result is Negative. Fact Sheet for Patients:  StrictlyIdeas.no Fact Sheet for Healthcare Providers: BankingDealers.co.za This test is not yet approved or cleared by the [place] FDA and has been authorized for detection and/or diagnosis of SARS-CoV-2 by FDA under an Emergency Use Authorization (EUA).  This EUA will remain in effect (meaning this test can be used) for the duration of the COVID-19 declaration under Section 564(b)(1) of the Act, 21 U.S.C. section 360bbb-3(b)(1), unless the authorization is terminated or revoked sooner. Performed at [facility], [address], [place], [place] [address]   Urine Culture     Status: None   Collection Time: 06/22/19  6:09 PM   Specimen: Urine, Random  Result Value Ref Range Status   Specimen Description   Final    URINE, RANDOM Performed at [facility], [address] [place], [place] [address]    Special Requests   Final    Normal Performed at [facility], [address], [place], [place] [address]    Culture   Final    NO GROWTH Performed at [facility], [address], [place], [place] [address]    Report Status 06/24/2019 FINAL  Final  Body fluid culture ( includes gram stain)     Status: None (Preliminary result)   Collection Time: 06/22/19  6:44 PM   Specimen: Peritoneal Cavity; Peritoneal Fluid  Result Value Ref Range Status   Specimen Description   Final    PERITONEAL CAVITY Performed at [facility], [address], [place], [place] [address]    Special Requests   Final    Normal Performed at [facility], [address] [place], [place] [address]    Gram Stain   Final    WBC PRESENT,BOTH PMN AND MONONUCLEAR NO ORGANISMS SEEN CYTOSPIN SMEAR    Culture   Final    NO GROWTH 3 DAYS Performed at [facility], [place] [address], [place], [place] [address]    Report Status PENDING  Incomplete  MRSA PCR Screening     Status: None   Collection Time: 06/23/19  4:22 AM   Specimen: Nasopharyngeal  Result Value Ref Range Status   MRSA by PCR NEGATIVE NEGATIVE Final    Comment:        The GeneXpert MRSA Assay (FDA approved for NASAL specimens only), is one component of a comprehensive MRSA colonization surveillance program. It is not intended to diagnose MRSA infection nor to guide or monitor treatment for MRSA infections. Performed at [facility], [address], [place], [place] [address]   Body fluid culture with gram stain  Status: None (Preliminary result)   Collection Time: 06/23/19  1:46 PM    Specimen: Peritoneal Washings; Body Fluid  Result Value Ref Range Status   Specimen Description   Final    PERITONEAL FLUID Performed at [facility], [address] [place], [place] [address]    Special Requests   Final    NONE Performed at [facility], [address] [place], [place] [address]    Gram Stain   Final    RARE WBC PRESENT, PREDOMINANTLY MONONUCLEAR NO ORGANISMS SEEN    Culture   Final    NO GROWTH 2 DAYS Performed at [facility], [place] [address], [place], [place] [address]    Report Status PENDING  Incomplete     Studies: No results found.  Scheduled Meds: . Chlorhexidine Gluconate Cloth  6 each Topical Daily  . digoxin  0.125 mg Oral BID  . feeding supplement (ENSURE ENLIVE)  237 mL Oral BID BM  . FLUoxetine  10 mg Oral Daily  . furosemide  40 mg Oral Daily  . gabapentin  100 mg Oral BID  . lactobacillus  1 g Oral TID WC  . metoprolol succinate  12.5 mg Oral Daily  . midodrine  5 mg Oral TID WC  . multivitamin with minerals  1 tablet Oral Daily  . pantoprazole  40 mg Oral BID  . spironolactone  50 mg Oral Daily  . sulfamethoxazole-trimethoprim  1 tablet Oral Daily   Continuous Infusions:   Assessment/Plan:  1. Clinical sepsis, present on admission.  SBP ruled out.  Case discussed with critical care specialist and he thinks this is atelectasis and not pneumonia.  Discontinue antibiotics.  2. Cirrhosis of the liver with ascites that is recurrent.  Mortality is very high within 2 years of developing ascites.  Hopefully can go back on Lasix and Aldactone soon.  Blood pressure is the limiting factor at this point.  Paracentesis yesterday.  Gastroenterology recommended Bactrim DS daily to prevent SBP.  GI has recommended to discontinue p.o. vancomycin if patient has completed 14-day course from the start date.  Patient has completed vancomycin course for total of 14 days from the start date.  Patient is not a candidate for liver  transplantation as her meld score is at 77.  GI is recommending to add low-dose Lasix and Aldactone 50 mg once daily.  Patient is tolerating fine repeat a.m. labs  3. hypotension.  Patient states that her blood pressure has been low for a while. 4. Atrial fibrillation with rapid ventricular response.  [place] cardiology consultation.  Patient is on digoxin twice daily and low-dose metoprolol is added to the regimen Not a candidate for anticoagulation currently. 5. Hypokalemia and hypomagnesemia.  Replaced 6. Hepatitis C.  As per husband this was treated. 7. Peritoneal fluid leakage from the paracentesis site.-As patient is at high risk for spontaneous bacterial peritonitis, surgery has seen the patient and closed the  leakage of the peritoneal fluid.  Surgery signed off 8. Disposition Home health ask home health PT.  Outpatient palliative care is recommended by palliative care specialist  Code Status:     Code Status Orders  (From admission, onward)         Start     Ordered   06/22/19 2023  Do not attempt resuscitation (DNR)  Continuous    Question Answer Comment  In the event of cardiac or respiratory ARREST Do not call a "code blue"   In the event of  cardiac or respiratory ARREST Do not perform Intubation, CPR, defibrillation or ACLS   In the event of cardiac or respiratory ARREST Use medication by any route, position, wound care, and other measures to relive pain and suffering. May use oxygen, suction and manual treatment of airway obstruction as needed for comfort.   Comments RN may pronounce      06/22/19 2023        Code Status History    Date Active Date Inactive Code Status Order ID Comments User Context   06/01/2019 2148 06/11/2019 2150 Full Code [id number]  Bettey Costa, MD Inpatient   01/28/2019 1920 01/31/2019 1827 Full Code [id number]  Otila Back, MD ED   01/18/2019 1651 01/19/2019 1946 DNR [id number]  Otila Back, MD ED   Advance Care Planning Activity     Family  Communication: Spoke with husband on the phone Disposition Plan: To be determined  Consultants:  Cardiology-[facility]  Gastroenterology  Antibiotics:  Rocephin  Flagyl  Time spent: 36  minutes, case discussed with gastroenterology  Nicholes Mango  [employer]

## 2019-06-26 NOTE — Telephone Encounter (Signed)
-----   Message from [name], MD sent at 06/26/2019  4:03 PM EDT ----- Regarding: Referral to [facility] transplant hepatology Please refer her to [facility] transplant hepatology Dx; decompensated alcoholic cirrhosis  Thanks RV

## 2019-06-27 ENCOUNTER — Telehealth: Payer: Self-pay | Admitting: Physician Assistant

## 2019-06-27 DIAGNOSIS — Z515 Encounter for palliative care: Secondary | ICD-10-CM

## 2019-06-27 DIAGNOSIS — I483 Typical atrial flutter: Secondary | ICD-10-CM

## 2019-06-27 DIAGNOSIS — Z7189 Other specified counseling: Secondary | ICD-10-CM

## 2019-06-27 LAB — PHOSPHORUS: Phosphorus: 2.8 mg/dL (ref 2.5–4.6)

## 2019-06-27 LAB — CULTURE, BLOOD (ROUTINE X 2)
Culture: NO GROWTH
Culture: NO GROWTH
Special Requests: ADEQUATE
Special Requests: ADEQUATE

## 2019-06-27 LAB — CBC
HCT: 26.5 % — ABNORMAL LOW (ref 36.0–46.0)
Hemoglobin: 8.4 g/dL — ABNORMAL LOW (ref 12.0–15.0)
MCH: 24.7 pg — ABNORMAL LOW (ref 26.0–34.0)
MCHC: 31.7 g/dL (ref 30.0–36.0)
MCV: 77.9 fL — ABNORMAL LOW (ref 80.0–100.0)
Platelets: 188 10*3/uL (ref 150–400)
RBC: 3.4 MIL/uL — ABNORMAL LOW (ref 3.87–5.11)
RDW: 18.6 % — ABNORMAL HIGH (ref 11.5–15.5)
WBC: 10.6 10*3/uL — ABNORMAL HIGH (ref 4.0–10.5)
nRBC: 0 % (ref 0.0–0.2)

## 2019-06-27 LAB — IRON AND TIBC
Iron: 12 ug/dL — ABNORMAL LOW (ref 28–170)
Saturation Ratios: 4 % — ABNORMAL LOW (ref 10.4–31.8)
TIBC: 304 ug/dL (ref 250–450)
UIBC: 292 ug/dL

## 2019-06-27 LAB — MAGNESIUM: Magnesium: 1.5 mg/dL — ABNORMAL LOW (ref 1.7–2.4)

## 2019-06-27 LAB — BASIC METABOLIC PANEL
Anion gap: 6 (ref 5–15)
BUN: 5 mg/dL — ABNORMAL LOW (ref 6–20)
CO2: 26 mmol/L (ref 22–32)
Calcium: 7.9 mg/dL — ABNORMAL LOW (ref 8.9–10.3)
Chloride: 102 mmol/L (ref 98–111)
Creatinine, Ser: 0.54 mg/dL (ref 0.44–1.00)
GFR calc Af Amer: >60 mL/min (ref >60)
GFR calc non Af Amer: >60 mL/min (ref >60)
Glucose, Bld: 125 mg/dL — ABNORMAL HIGH (ref 70–99)
Potassium: 3.7 mmol/L (ref 3.5–5.1)
Sodium: 134 mmol/L — ABNORMAL LOW (ref 135–145)

## 2019-06-27 LAB — BODY FLUID CULTURE
Culture: NO GROWTH
Special Requests: NORMAL

## 2019-06-27 LAB — FERRITIN: Ferritin: 15 ng/mL (ref 11–307)

## 2019-06-27 MED ORDER — SODIUM CHLORIDE 0.9 % IV SOLN
510.0000 mg | Freq: Once | INTRAVENOUS | Status: AC
  Administered 2019-06-27: 21:00:00 510 mg via INTRAVENOUS
  Filled 2019-06-27: qty 17

## 2019-06-27 MED ORDER — METOPROLOL SUCCINATE ER 25 MG PO TB24
25.0000 mg | ORAL_TABLET | Freq: Every day | ORAL | Status: DC
  Administered 2019-06-27 – 2019-06-28 (×2): 25 mg via ORAL
  Filled 2019-06-27 (×2): qty 1

## 2019-06-27 MED ORDER — POTASSIUM CHLORIDE CRYS ER 10 MEQ PO TBCR
10.0000 meq | EXTENDED_RELEASE_TABLET | Freq: Once | ORAL | Status: AC
  Administered 2019-06-27: 10 meq via ORAL
  Filled 2019-06-27: qty 1

## 2019-06-27 MED ORDER — SODIUM CHLORIDE 0.9 % IV SOLN
300.0000 mg | Freq: Once | INTRAVENOUS | Status: DC
  Filled 2019-06-27: qty 15

## 2019-06-27 MED ORDER — DIGOXIN 125 MCG PO TABS
0.1250 mg | ORAL_TABLET | Freq: Every day | ORAL | Status: DC
  Administered 2019-06-28: 0.125 mg via ORAL
  Filled 2019-06-27: qty 1

## 2019-06-27 MED ORDER — DIGOXIN 250 MCG PO TABS
0.2500 mg | ORAL_TABLET | Freq: Every day | ORAL | Status: DC
  Administered 2019-06-27: 0.25 mg via ORAL
  Filled 2019-06-27: qty 1

## 2019-06-27 MED ORDER — MAGNESIUM SULFATE 4 GM/100ML IV SOLN
4.0000 g | Freq: Once | INTRAVENOUS | Status: AC
  Administered 2019-06-27: 10:00:00 4 g via INTRAVENOUS
  Filled 2019-06-27: qty 100

## 2019-06-27 MED ORDER — POTASSIUM CHLORIDE CRYS ER 20 MEQ PO TBCR
20.0000 meq | EXTENDED_RELEASE_TABLET | Freq: Once | ORAL | Status: AC
  Administered 2019-06-27: 10:00:00 20 meq via ORAL
  Filled 2019-06-27: qty 1

## 2019-06-27 NOTE — Progress Notes (Signed)
Physical Therapy Treatment Patient Details Name: Anita Reed Full MRN: [id number] DOB: February 27, 1961 Today's Date: 06/27/2019    History of Present Illness pt is a 58yo female who was admitted after having abnormal labs during follow up with PCP. pt recently admitted from 10/12-10/22 for GI bleeding, C Diff and bacterial peritonitis    PT Comments    Pt in bed upon arrival and agreed to participate with PT. Pt reported increased fatigue today and just wanting to rest. Pt refused to sit up in chair following therapy session. Pt BP low today 83/55 HR 118 in supine at rest start of session with nursing present and notified. Pt performed LE therex with improved performance and ease however pt complained of pain throughout session rating 6/10. Pts HR monitored throughout session due to consistently high HR and HR maintained low 120s during activity. Pt ambulated in room with improved gait pattern and less reaching out for furniture to feel safe. Pt then began to feel lightheaded and nauseous and requested to return to bed. BP 86/59 post ambulation and HR 123. Pt presents with decreased strength, balance and activity tolerance and would benefit from skilled acute therapy. Home health PT remains appropriate following hospital discharge.    Follow Up Recommendations  Home health PT     Equipment Recommendations  None recommended by PT    Recommendations for Other Services       Precautions / Restrictions Precautions Precautions: Fall Restrictions Weight Bearing Restrictions: No    Mobility  Bed Mobility Overal bed mobility: Modified Independent             General bed mobility comments: pt able to get EOB without physical assist, increased time and effort, use of bed rails, improved from last session  Transfers Overall transfer level: Needs assistance Equipment used: None Transfers: Sit to/from Stand Sit to Stand: Min guard         General transfer comment: min guard for safety  secondary to vitals, pt steady with transfer, pt relies heavy on pushing up with UEs  Ambulation/Gait Ambulation/Gait assistance: Min guard Gait Distance (Feet): 15 Feet Assistive device: None Gait Pattern/deviations: Step-through pattern;Decreased stride length;Trunk flexed Gait velocity: decreased   General Gait Details: pt ambulated in room with improved gait pattern from last session and more steady on feet, HR monitored and stayed in low 120s during ambulation, no LOB or unsteadiness noted   Stairs             Wheelchair Mobility    Modified Rankin (Stroke Patients Only)       Balance Overall balance assessment: Needs assistance Sitting-balance support: Feet supported Sitting balance-Leahy Scale: Good Sitting balance - Comments: steady sitting EOB during LE therex, posterior lean with trunk during exercises suspect mostly secondary to pain during exercises     Standing balance-Leahy Scale: Fair                              Cognition Arousal/Alertness: Awake/alert Behavior During Therapy: WFL for tasks assessed/performed Overall Cognitive Status: Within Functional Limits for tasks assessed                                        Exercises Total Joint Exercises Long Arc Quad: AROM;Both;15 reps Marching in Standing: AROM;Both;20 reps;Seated General Exercises - Lower Extremity Toe Raises: AROM;Both;15 reps Heel Raises: AROM;Both;15 reps Other  Exercises Other Exercises: STS x5 reps from bed, heavy use of arms to push up from bed but steady throughout, HR maintained low 120s, improved from initial eval    General Comments        Pertinent Vitals/Pain Pain Score: 6  Pain Location: B legs, B feet, abdomen Pain Descriptors / Indicators: Sore;Aching;Grimacing;Discomfort Pain Intervention(s): Limited activity within patient's tolerance;Monitored during session;Repositioned    Home Living                      Prior  Function            PT Goals (current goals can now be found in the care plan section) Progress towards PT goals: Progressing toward goals    Frequency    Min 2X/week      PT Plan Current plan remains appropriate    Co-evaluation              AM-PAC PT "6 Clicks" Mobility   Outcome Measure  Help needed turning from your back to your side while in a flat bed without using bedrails?: None Help needed moving from lying on your back to sitting on the side of a flat bed without using bedrails?: None Help needed moving to and from a bed to a chair (including a wheelchair)?: None Help needed standing up from a chair using your arms (e.g., wheelchair or bedside chair)?: None Help needed to walk in hospital room?: A Little Help needed climbing 3-5 steps with a railing? : A Little 6 Click Score: 22    End of Session Equipment Utilized During Treatment: Gait belt Activity Tolerance: Patient limited by fatigue;No increased pain(increased nausea) Patient left: in bed;with call bell/phone within reach Nurse Communication: Mobility status PT Visit Diagnosis: Muscle weakness (generalized) (M62.81);Unsteadiness on feet (R26.81);Other abnormalities of gait and mobility (R26.89)     Time: [id number] PT Time Calculation (min) (ACUTE ONLY): 21 min  Charges:  $Therapeutic Exercise: 8-22 mins                     Khylan Sawyer PT, DPT 10:21 AM,06/27/19 [phone]

## 2019-06-27 NOTE — Progress Notes (Signed)
Nutrition Follow-up  RD working remotely.  DOCUMENTATION CODES:   Not applicable  INTERVENTION:  Continue Ensure Enlive po BID, each supplement provides 350 kcal and 20 grams of protein.  Continue daily MVI.  NUTRITION DIAGNOSIS:   Increased nutrient needs related to catabolic illness(cirrhosis) as evidenced by estimated needs.  Ongoing.  GOAL:   Patient will meet greater than or equal to 90% of their needs  Progressing.  MONITOR:   PO intake, Supplement acceptance, Labs, Weight trends, I & O's, Skin  REASON FOR ASSESSMENT:   Malnutrition Screening Tool, Consult Assessment of nutrition requirement/status  ASSESSMENT:   58 year old female with PMHx of HTN, hepatitis C, cirrhosis, anemia admitted with sepsis, recurrent ascites in setting of cirrhosis, A-fib with RVR.   -Patient s/p US guided paracentesis yesterday that removed 3900 mL fluid on 10/27.  Spoke with patient over the phone. She reports that her appetite is better today after her paracentesis. She was able to have some pancakes at breakfast. She reports she does not like the Ensure but she is still able to drink them and will drink some today. She reports she understands she needs the protein and calories.  Medications reviewed and include: Lasix 40 mg daily, gabapentin, lactobacillus 1 gram TID with meals, MVI daily, pantoprazole, spironolactone 50 mg daily, magnesium sulfate 4 grams IV once today.  Labs reviewed: Sodium 134, BUN 5, Magnesium 1.5.  Diet Order:   Diet Order            Diet Heart Room service appropriate? Yes; Fluid consistency: Thin  Diet effective now             EDUCATION NEEDS:   No education needs have been identified at this time  Skin:  Skin Assessment: Skin Integrity Issues:(closed incisions abdomen and right flank)  Last BM:  06/25/2019 per chart  Height:   Ht Readings from Last 1 Encounters:  06/24/19 5' 8.5" (1.74 m)   Weight:   Wt Readings from Last 1  Encounters:  06/24/19 76.1 kg   Ideal Body Weight:  64.8 kg  BMI:  Body mass index is 25.14 kg/m.  Estimated Nutritional Needs:   Kcal:  2000-2200  Protein:  100-110 grams  Fluid:  1.8 L/day  Willey Blade, MS, RD, LDN Office: [phone] Pager: [phone] After Hours/Weekend Pager: [phone]

## 2019-06-27 NOTE — Care Management Important Message (Addendum)
Important Message  Patient Details  Name: Destinae Sar MRN: [id number] Date of Birth: Jan 07, 1961   Medicare Important Message Given:  Yes  IM documented in error, please disregard.  Thank you, Lubertha Basque A Brody Bonneau 06/27/2019, 7:37 AM

## 2019-06-27 NOTE — Progress Notes (Signed)
Progress Note  Patient Name: Anita Reed Date of Encounter: 06/27/2019  Primary Cardiologist: Iverson Alamin  Subjective   Long discussion with patient regarding her current rhythm, rate control, as well as her comorbid conditions. Patient reported that she wants to stay and get her rate under control at this time. She remains asx in atrial flutter without chest pain, feelings of racing HR, or palpitations. She denies any current SOB. She expressed concern regarding her volume status after discharge, as she was reportedly told by another provider earlier in the day that she would not been sent home on a diuretic (with IV diuresis originally started by GI).   Inpatient Medications    Scheduled Meds: . Chlorhexidine Gluconate Cloth  6 each Topical Daily  . digoxin  0.25 mg Oral Daily  . feeding supplement (ENSURE ENLIVE)  237 mL Oral BID BM  . FLUoxetine  10 mg Oral Daily  . furosemide  40 mg Oral Daily  . gabapentin  100 mg Oral BID  . lactobacillus  1 g Oral TID WC  . metoprolol succinate  25 mg Oral Daily  . midodrine  5 mg Oral TID WC  . multivitamin with minerals  1 tablet Oral Daily  . pantoprazole  40 mg Oral BID  . spironolactone  50 mg Oral Daily  . sulfamethoxazole-trimethoprim  1 tablet Oral Daily   Continuous Infusions: . magnesium sulfate bolus IVPB 4 g (06/27/19 1020)   PRN Meds: baclofen, HYDROmorphone (DILAUDID) injection, ondansetron **OR** ondansetron (ZOFRAN) IV, oxyCODONE   Vital Signs    Vitals:   06/27/19 0144 06/27/19 0453 06/27/19 0812 06/27/19 1000  BP: (!) 151/134 105/66 (!) 93/55 (!) 81/62  Pulse: (!) 128 (!) 123 (!) 123 (!) 117  Resp:   17 20  Temp: 98.9 F (37.2 C) 98.3 F (36.8 C) 98.1 F (36.7 C) 98.2 F (36.8 C)  TempSrc: Oral Oral Oral Oral  SpO2: 96% 96% 96% 98%  Weight:      Height:        Intake/Output Summary (Last 24 hours) at 06/27/2019 1115 Last data filed at 06/26/2019 1705 Gross per 24 hour  Intake 40.52 ml  Output -   Net 40.52 ml   Last 3 Weights 06/24/2019 06/22/2019 06/22/2019  Weight (lbs) 167 lb 12.3 oz 168 lb 165 lb  Weight (kg) 76.1 kg 76.204 kg 74.844 kg      Telemetry    Atrial flutter with variable conduction, rates 90s-130s - Personally Reviewed  ECG    No new tracings- Personally Reviewed  Physical Exam   GEN: No acute distress.   Neck: No JVD Cardiac: tachycardic and irregular, no murmurs, rubs, or gallops.  Respiratory: Bilaterally reduced breath sounds. GI: Soft, distended  MS: No edema; No deformity. Neuro:  Nonfocal  Psych: Normal affect, pleasant   Labs    High Sensitivity Troponin:   Recent Labs  Lab 06/01/19 1251 06/01/19 1926  TROPONINIHS 12 15      Cardiac EnzymesNo results for input(s): TROPONINI in the last 168 hours. No results for input(s): TROPIPOC in the last 168 hours.   Chemistry Recent Labs  Lab 06/23/19 0522  06/24/19 0511 06/25/19 0418 06/26/19 0337 06/27/19 0626  NA 132*  --  133* 135 134* 134*  K 2.8*   < > 4.1 4.7 4.0 3.7  CL 97*  --  102 104 103 102  CO2 24  --  21* 25 26 26   GLUCOSE 102*  --  104* 98 125* 125*  BUN 6  --  <5* <5* 5* 5*  CREATININE 0.56  --  0.50 0.37* 0.50 0.54  CALCIUM 7.8*  --  8.0* 8.2* 8.0* 7.9*  PROT 4.9*  --  4.6* 4.5*  --   --   ALBUMIN 2.7*  --  2.5* 2.4*  --   --   AST 36  --  38 36  --   --   ALT 17  --  15 15  --   --   ALKPHOS 385*  --  367* 411*  --   --   BILITOT 1.3*  --  1.4* 1.3*  --   --   GFRNONAA >60  --  >60 >60 >60 >60  GFRAA >60  --  >60 >60 >60 >60  ANIONGAP 11  --  10 6 5 6    < > = values in this interval not displayed.     Hematology Recent Labs  Lab 06/22/19 1600 06/23/19 0522 06/24/19 0511 06/25/19 0418  WBC 12.3* 10.7* 11.0*  --   RBC 4.20 3.35* 3.58*  --   HGB 10.4* 8.2* 8.9* 8.3*  HCT 33.2* 26.5* 28.1*  --   MCV 79.0* 79.1* 78.5*  --   MCH 24.8* 24.5* 24.9*  --   MCHC 31.3 30.9 31.7  --   RDW 19.1* 19.0* 19.3*  --   PLT 222 177 180  --     BNPNo results for  input(s): BNP, PROBNP in the last 168 hours.   DDimer No results for input(s): DDIMER in the last 168 hours.   Radiology    US Paracentesis  Result Date: 06/27/2019 INDICATION: 58 year old with decompensated cirrhosis and ascites. EXAM: ULTRASOUND GUIDED PARACENTESIS MEDICATIONS: None. COMPLICATIONS: None immediate. PROCEDURE: Informed written consent was obtained from the patient after a discussion of the risks, benefits and alternatives to treatment. A timeout was performed prior to the initiation of the procedure. Initial ultrasound scanning demonstrates a large amount of ascites within the right lower abdominal quadrant. The right lower abdomen was prepped and draped in the usual sterile fashion. 1% lidocaine was used for local anesthesia. Following this, a 6 Fr Safe-T-Centesis catheter was introduced. An ultrasound image was saved for documentation purposes. The paracentesis was performed. The catheter was removed and a dressing was applied. The patient tolerated the procedure well without immediate post procedural complication. FINDINGS: A total of approximately 3.9 L of yellow fluid was removed. IMPRESSION: Successful ultrasound-guided paracentesis yielding 3.9 liters of peritoneal fluid. Electronically Signed   By: Markus Daft M.D.   On: 06/27/2019 07:52    Cardiac Studies  Echo 01/19/19 1. The left ventricle has normal systolic function, with an ejection fraction of 55-60%. The cavity size was normal. Left ventricular diastolic function could not be evaluated. No evidence of left ventricular regional wall motion abnormalities. 2. The right ventricle has normal systolic function. The cavity was not assessed. There is no increase in right ventricular wall thickness. Right ventricular systolic pressure is mildly elevated with an estimated pressure of 35.0 mmHg. 3. Left atrial size was mildly dilated. 4. The mitral valve is degenerative. Mild thickening of the mitral valve leaflet. There is  moderate mitral annular calcification present. No evidence of mitral valve stenosis. 5. The tricuspid valve is grossly normal. Tricuspid valve regurgitation is mild-moderate. 6. The aortic valve is tricuspid. Moderate thickening of the aortic valve. Mild calcification of the aortic valve. 7. The aortic root is normal in size and structure. 8. The inferior vena cava was  dilated in size with <50% respiratory variability. 9. Underlying rhythm appears to be atrial flutter.   Patient Profile     58 y.o. female with hx of C. difficile colitis, portal hypertension, portal hypertensive gastropathy, spontaneous bacterial peritonitis, hepatic encephalopathy, decompensated alcoholic cirrhosis, esophageal varices, ascites,current smoker,no known past medical history of heart failure or arrhythmia,and newly diagnosed atrial flutterwho is being seen today for the evaluation of new onset atrial flutter in the setting of hypokalemia.  Assessment & Plan    Paroxysmal atrial flutterwith RVR --Relatively asymptomaticin atrial flutter. Rate control continues to be suboptimal with ventricular rates into the 130s. 12/2018 echo, nl EF and dilated LA. --Plan for rate control only at this time.Continue digoxin. Recommend decrease back to digoxin 0.125mg  daily to reduce risk of toxicity (with current renal function stable). Daily BMET. Started on low dose BB yesterday and to be escalated despite hypotension (unless under BP 80/50) 2/2 known cirrhosis for optimal rate control.Amiodarone not recommended at this time, given pharmacologic conversion still carries risk of thrombosis and not therapeutically anticoagulated.  --Currently, not on anticoagulation due to risk of bleeding in the setting of known cirrhosis and h/o esophageal varices. She sees GI as an outpatient for variceal surveillance. CHA2DS2VASc score of at [facility] (age, vascular, female). Will order CBC. Discussed TEE/DCC versus DCCV with the patient  yesterday with preference to continue our attempts to medically control her ventricular rates due to risks associated with anticoagulation given her comorbid conditions as below. If rates prove difficult to control with escalation of BB as above, will need to revisit risks and benefits again. In the meantime, will replete electrolytes as below. Ordered additional KCl, in addition to that ordered by IM this morning to maintain K with goal 4.0. Mg 1.5 with goal 2.0 on a drip currently.   Hypomagnesemia, hypokalemia --Started on Mg drip and KCl tab for repletion --K goal 4.0. Mg goal 2.0.  History of GI bleed History of Esophageal varices Known Liver cirrhosis  --Last Hgb 8.3. Recommend transfusion for hemoglobin below 7.  --Daily CBC. Will order for today. --S/p paracentesis and suture for slow leak, performed by surgery.  --Per GI note, she does not yet meet criteria for liver transplant. GI started on gentle diuresis and this is reasonable to continue with close monitoring of renal function.  She was also started on midodrine with recommendation to continue as an outpatient.    Recent Cdiff --Continue antibiotics, per IM. Daily BMET as above.   For questions or updates, please contact [place] Please consult [email or web address] for contact info under        Signed, Arvil Chaco, PA-C  06/27/2019, 11:15 AM

## 2019-06-27 NOTE — Progress Notes (Signed)
Anita Darby, MD [address]  [place]  [place], [place] [address]  Main: [phone]  Fax: [phone] Pager: [phone]   Subjective: Heart rate still rises with physical activity.  Diuresing well.  Blood pressure has been maintained as expected.  She reports feeling better.  Abdomen less distended   Objective: Vital signs in last 24 hours: Vitals:   06/27/19 1000 06/27/19 1400 06/27/19 1527 06/27/19 1641  BP: (!) 81/62 (!) 92/52    Pulse: (!) 117 (!) 117 (!) 110 98  Resp: 20 20    Temp: 98.2 F (36.8 C) 98.2 F (36.8 C)    TempSrc: Oral Oral    SpO2: 98% 94%    Weight:      Height:       Weight change:   Intake/Output Summary (Last 24 hours) at 06/27/2019 1854 Last data filed at 06/27/2019 1439 Gross per 24 hour  Intake 86.83 ml  Output -  Net 86.83 ml     Exam: Heart:: Tachycardia, regular rhythm Lungs: normal and clear to auscultation Abdomen: Soft, mildly distended, nontender   Lab Results: CBC Latest Ref Rng & Units 06/27/2019 06/25/2019 06/24/2019  WBC 4.0 - 10.5 K/uL 10.6(H) - 11.0(H)  Hemoglobin 12.0 - 15.0 g/dL 8.4(L) 8.3(L) 8.9(L)  Hematocrit 36.0 - 46.0 % 26.5(L) - 28.1(L)  Platelets 150 - 400 K/uL 188 - 180   CMP Latest Ref Rng & Units 06/27/2019 06/26/2019 06/25/2019  Glucose 70 - 99 mg/dL 125(H) 125(H) 98  BUN 6 - 20 mg/dL 5(L) 5(L) <5(L)  Creatinine 0.44 - 1.00 mg/dL 0.54 0.50 0.37(L)  Sodium 135 - 145 mmol/L 134(L) 134(L) 135  Potassium 3.5 - 5.1 mmol/L 3.7 4.0 4.7  Chloride 98 - 111 mmol/L 102 103 104  CO2 22 - 32 mmol/L 26 26 25   Calcium 8.9 - 10.3 mg/dL 7.9(L) 8.0(L) 8.2(L)  Total Protein 6.5 - 8.1 g/dL - - 4.5(L)  Total Bilirubin 0.3 - 1.2 mg/dL - - 1.3(H)  Alkaline Phos 38 - 126 U/L - - 411(H)  AST 15 - 41 U/L - - 36  ALT 0 - 44 U/L - - 15    Micro Results: Recent Results (from the past 240 hour(s))  Blood culture (routine x 2)     Status: None   Collection Time: 06/22/19  4:20 PM   Specimen: BLOOD   Result Value Ref Range Status   Specimen Description BLOOD BLOOD RIGHT ARM  Final   Special Requests   Final    BOTTLES DRAWN AEROBIC AND ANAEROBIC Blood Culture adequate volume   Culture   Final    NO GROWTH 5 DAYS Performed at [facility], [address] [place], [place] [address]    Report Status 06/27/2019 FINAL  Final  Blood culture (routine x 2)     Status: None   Collection Time: 06/22/19  4:20 PM   Specimen: BLOOD  Result Value Ref Range Status   Specimen Description BLOOD BLOOD LEFT HAND  Final   Special Requests   Final    BOTTLES DRAWN AEROBIC AND ANAEROBIC Blood Culture adequate volume   Culture   Final    NO GROWTH 5 DAYS Performed at [facility], [address], [place], [place] [address]    Report Status 06/27/2019 FINAL  Final  SARS Coronavirus 2 by RT PCR (hospital order, performed in [place] hospital lab) Nasopharyngeal Nasopharyngeal Swab     Status: None   Collection Time: 06/22/19  4:40 PM  Specimen: Nasopharyngeal Swab  Result Value Ref Range Status   SARS Coronavirus 2 NEGATIVE NEGATIVE Final    Comment: (NOTE) If result is NEGATIVE SARS-CoV-2 target nucleic acids are NOT DETECTED. The SARS-CoV-2 RNA is generally detectable in upper and lower  respiratory specimens during the acute phase of infection. The lowest  concentration of SARS-CoV-2 viral copies this assay can detect is 250  copies / mL. A negative result does not preclude SARS-CoV-2 infection  and should not be used as the sole basis for treatment or other  patient management decisions.  A negative result may occur with  improper specimen collection / handling, submission of specimen other  than nasopharyngeal swab, presence of viral mutation(s) within the  areas targeted by this assay, and inadequate number of viral copies  (<250 copies / mL). A negative result must be combined with clinical  observations, patient history, and epidemiological information. If result  is POSITIVE SARS-CoV-2 target nucleic acids are DETECTED. The SARS-CoV-2 RNA is generally detectable in upper and lower  respiratory specimens dur ing the acute phase of infection.  Positive  results are indicative of active infection with SARS-CoV-2.  Clinical  correlation with patient history and other diagnostic information is  necessary to determine patient infection status.  Positive results do  not rule out bacterial infection or co-infection with other viruses. If result is PRESUMPTIVE POSTIVE SARS-CoV-2 nucleic acids MAY BE PRESENT.   A presumptive positive result was obtained on the submitted specimen  and confirmed on repeat testing.  While 2019 novel coronavirus  (SARS-CoV-2) nucleic acids may be present in the submitted sample  additional confirmatory testing may be necessary for epidemiological  and / or clinical management purposes  to differentiate between  SARS-CoV-2 and other Sarbecovirus currently known to infect humans.  If clinically indicated additional testing with an alternate test  methodology [phone]) is advised. The SARS-CoV-2 RNA is generally  detectable in upper and lower respiratory sp ecimens during the acute  phase of infection. The expected result is Negative. Fact Sheet for Patients:  StrictlyIdeas.no Fact Sheet for Healthcare Providers: BankingDealers.co.za This test is not yet approved or cleared by the [place] FDA and has been authorized for detection and/or diagnosis of SARS-CoV-2 by FDA under an Emergency Use Authorization (EUA).  This EUA will remain in effect (meaning this test can be used) for the duration of the COVID-19 declaration under Section 564(b)(1) of the Act, 21 U.S.C. section 360bbb-3(b)(1), unless the authorization is terminated or revoked sooner. Performed at [facility], [address], [place], [place] [address]   Urine Culture     Status: None   Collection  Time: 06/22/19  6:09 PM   Specimen: Urine, Random  Result Value Ref Range Status   Specimen Description   Final    URINE, RANDOM Performed at [facility], [address], [place], [place] [address]    Special Requests   Final    Normal Performed at [facility], [address] [place], [place] [address]    Culture   Final    NO GROWTH Performed at [facility], [place] [address] [place], [place] [address]    Report Status 06/24/2019 FINAL  Final  Body fluid culture ( includes gram stain)     Status: None   Collection Time: 06/22/19  6:44 PM   Specimen: Peritoneal Cavity; Peritoneal Fluid  Result Value Ref Range Status   Specimen Description   Final    PERITONEAL CAVITY Performed  at [facility], [address], [place], [place] [address]    Special Requests   Final    Normal Performed at [facility], [place], [place] [address]    Gram Stain   Final    WBC PRESENT,BOTH PMN AND MONONUCLEAR NO ORGANISMS SEEN CYTOSPIN SMEAR    Culture   Final    NO GROWTH 3 DAYS Performed at [facility], [place] [address] [place], [place] [address]    Report Status 06/27/2019 FINAL  Final  MRSA PCR Screening     Status: None   Collection Time: 06/23/19  4:22 AM   Specimen: Nasopharyngeal  Result Value Ref Range Status   MRSA by PCR NEGATIVE NEGATIVE Final    Comment:        The GeneXpert MRSA Assay (FDA approved for NASAL specimens only), is one component of a comprehensive MRSA colonization surveillance program. It is not intended to diagnose MRSA infection nor to guide or monitor treatment for MRSA infections. Performed at [facility], [address] [place], [place] [address]   Body fluid culture with gram stain     Status: None (Preliminary result)   Collection Time: 06/23/19  1:46 PM   Specimen: Peritoneal Washings; Body Fluid  Result Value Ref Range Status   Specimen Description   Final     PERITONEAL FLUID Performed at [facility], [address] [place], [place] [address]    Special Requests   Final    NONE Performed at [facility], [address] [place], [place] [address]    Gram Stain   Final    RARE WBC PRESENT, PREDOMINANTLY MONONUCLEAR NO ORGANISMS SEEN    Culture   Final    NO GROWTH 2 DAYS Performed at [facility], [place] [address], [place],  [address]    Report Status PENDING  Incomplete   Studies/Results: US Paracentesis  Result Date: 06/27/2019 INDICATION: 58 year old with decompensated cirrhosis and ascites. EXAM: ULTRASOUND GUIDED PARACENTESIS MEDICATIONS: None. COMPLICATIONS: None immediate. PROCEDURE: Informed written consent was obtained from the patient after a discussion of the risks, benefits and alternatives to treatment. A timeout was performed prior to the initiation of the procedure. Initial ultrasound scanning demonstrates a large amount of ascites within the right lower abdominal quadrant. The right lower abdomen was prepped and draped in the usual sterile fashion. 1% lidocaine was used for local anesthesia. Following this, a 6 Fr Safe-T-Centesis catheter was introduced. An ultrasound image was saved for documentation purposes. The paracentesis was performed. The catheter was removed and a dressing was applied. The patient tolerated the procedure well without immediate post procedural complication. FINDINGS: A total of approximately 3.9 L of yellow fluid was removed. IMPRESSION: Successful ultrasound-guided paracentesis yielding 3.9 liters of peritoneal fluid. Electronically Signed   By: Markus Daft M.D.   On: 06/27/2019 07:52   Medications:  I have reviewed the patient's current medications. Prior to Admission:  Medications Prior to Admission  Medication Sig Dispense Refill Last Dose  . furosemide (LASIX) 40 MG tablet Take 40 mg by mouth 2 (two) times daily.   Past Week at Unknown time  . pantoprazole (PROTONIX)  40 MG tablet Take 1 tablet (40 mg total) by mouth 2 (two) times daily. 60 tablet 11 Past Week at Unknown time  . traMADol (ULTRAM) 50 MG tablet Take 1 tablet (50 mg total) by mouth 2 (two) times daily for 7 days. 14 tablet 0   .  baclofen (LIORESAL) 10 MG tablet Take 1 tablet (10 mg total) by mouth 3 (three) times daily. Muscle Relaxer 60 each 0    Scheduled: . Chlorhexidine Gluconate Cloth  6 each Topical Daily  . [START ON 06/28/2019] digoxin  0.125 mg Oral Daily  . feeding supplement (ENSURE ENLIVE)  237 mL Oral BID BM  . FLUoxetine  10 mg Oral Daily  . furosemide  40 mg Oral Daily  . gabapentin  100 mg Oral BID  . lactobacillus  1 g Oral TID WC  . metoprolol succinate  25 mg Oral Daily  . midodrine  5 mg Oral TID WC  . multivitamin with minerals  1 tablet Oral Daily  . pantoprazole  40 mg Oral BID  . spironolactone  50 mg Oral Daily  . sulfamethoxazole-trimethoprim  1 tablet Oral Daily   Continuous: . ferumoxytol    . [START ON 06/29/2019] iron sucrose     [id number], HYDROmorphone (DILAUDID) injection, ondansetron **OR** ondansetron (ZOFRAN) IV, oxyCODONE Anti-infectives (From admission, onward)   Start     Dose/Rate Route Frequency Ordered Stop   06/26/19 1000  vancomycin (VANCOCIN) 50 mg/mL oral solution 125 mg  Status:  Discontinued    Note to Pharmacy: Stop vanc if 14 day course is done from the start date   thx   125 mg Oral Daily 06/25/19 0939 06/25/19 1448   06/24/19 1500  vancomycin (VANCOCIN) 50 mg/mL oral solution 125 mg  Status:  Discontinued     125 mg Oral Daily 06/24/19 1315 06/25/19 0939   06/24/19 1400  sulfamethoxazole-trimethoprim (BACTRIM DS) 800-160 MG per tablet 1 tablet     1 tablet Oral Daily 06/24/19 1326     06/24/19 1330  sulfamethoxazole-trimethoprim (BACTRIM) 400-80 MG per tablet 1 tablet  Status:  Discontinued     1 tablet Oral Daily 06/24/19 1315 06/24/19 1326   06/23/19 0800  cefTRIAXone (ROCEPHIN) 2 g in sodium chloride 0.9 % 100 mL IVPB   Status:  Discontinued     2 g 200 mL/hr over 30 Minutes Intravenous Every 24 hours 06/22/19 1919 06/24/19 1307   06/22/19 2000  metroNIDAZOLE (FLAGYL) IVPB 500 mg  Status:  Discontinued     500 mg 100 mL/hr over 60 Minutes Intravenous Every 8 hours 06/22/19 1915 06/24/19 1308   06/22/19 1800  cefTRIAXone (ROCEPHIN) 2 g in sodium chloride 0.9 % 100 mL IVPB     2 g 200 mL/hr over 30 Minutes Intravenous  Once 06/22/19 1749 06/22/19 1938     Scheduled Meds: . Chlorhexidine Gluconate Cloth  6 each Topical Daily  . [START ON 06/28/2019] digoxin  0.125 mg Oral Daily  . feeding supplement (ENSURE ENLIVE)  237 mL Oral BID BM  . FLUoxetine  10 mg Oral Daily  . furosemide  40 mg Oral Daily  . gabapentin  100 mg Oral BID  . lactobacillus  1 g Oral TID WC  . metoprolol succinate  25 mg Oral Daily  . midodrine  5 mg Oral TID WC  . multivitamin with minerals  1 tablet Oral Daily  . pantoprazole  40 mg Oral BID  . spironolactone  50 mg Oral Daily  . sulfamethoxazole-trimethoprim  1 tablet Oral Daily   Continuous Infusions: . ferumoxytol    . [START ON 06/29/2019] iron sucrose     PRN Meds:.baclofen, HYDROmorphone (DILAUDID) injection, ondansetron **OR** ondansetron (ZOFRAN) IV, oxyCODONE   Assessment: Active Problems:   Atrial flutter (HCC)   Sepsis (HCC)   Goals of care,  counseling/discussion   Palliative care encounter  Decompensated cirrhosis of liver complicated by SBP Also, C. difficile colitis on vancomycin Tolerating diuretics well   Plan: Ascites; with history of SBP in 05/2019, currently no evidence of SBP Continue Bactrim DS long-term for SBP prophylaxis Continue midodrine 5 mg 3 times daily as outpatient Continue Lasix 40 mg and spironolactone 50 mg daily with close monitoring of renal function and electrolytes, patient is responding well to diuresis Low-sodium diet Paracentesis site has been sutured by surgery, appreciate their help  History of C. difficile colitis  in 05/2019 Patient finished oral vancomycin for 2 weeks total  A. fib with RVR: Remains tachycardic,  currently on digoxin 0.125 mg once daily.  Added metoprolol 25 mg today  Okay with anticoagulation from GI standpoint if it is absolutely deemed necessary Patient does have esophageal varices but did not have variceal bleeding in the past Appreciate cardiology input  History of esophageal varices Variceal surveillance as outpatient On metoprolol 25 mg daily  Iron deficiency anemia Recheck iron studies Recommend IV iron, ordered  Liver transplant candidacy Patient's current MELD-Na is only 16.  She does not meet the criteria for liver transplant yet.  Explained the process to patient.  However, she would like to be referred to [facility] transplant hepatology.  Outpatient referral will be placed Palliative care is following    LOS: 5 days   Siddhi Dornbush 06/27/2019, 6:54 PM

## 2019-06-27 NOTE — Consult Note (Addendum)
PHARMACY CONSULT NOTE - FOLLOW UP  Pharmacy Consult for Electrolyte Monitoring and Replacement   Recent Labs: Potassium (mmol/L)  Date Value  06/27/2019 3.7   Magnesium (mg/dL)  Date Value  06/27/2019 1.5 (L)   Calcium (mg/dL)  Date Value  06/27/2019 7.9 (L)   Albumin (g/dL)  Date Value  06/25/2019 2.4 (L)   Phosphorus (mg/dL)  Date Value  06/27/2019 2.8   Sodium (mmol/L)  Date Value  06/27/2019 134 (L)     Assessment: Pharmacy was consulted for electrolyte management.was on vancomycin for Cdiff 05/2019-finished course Hx recurrent ascites Patient on lasix 40 po daily, digoxin, spironolactone, bactrim for SBP prophylaxis  K 3.7, Mag 1.5  Phos 2.8 Scr 0.54  Goal of Therapy:  Electrolytes WNL Per cards goal K ~4, Mag ~2  Plan:  Mag sulfate 4 g IV x1. (Note- RN had to slow infusion to 14ml/hr for pt to tolerate) Will order KCL 20 meq PO x 1 (pt on lasix/dig-goal K 4.0) Will recheck w/ am labs.  Pharmacy will continue to follow.   Noralee Space, PharmD Clinical Pharmacist 06/27/2019 7:47 AM

## 2019-06-27 NOTE — Telephone Encounter (Signed)
   Updated husband on patient's condition from a cardiac standpoint.  He indicated his appreciation for the update.   Signed, Arvil Chaco, PA-C 06/27/2019, 5:37 PM Pager [phone]

## 2019-06-27 NOTE — Progress Notes (Signed)
Patient ID: Anita Reed, female   DOB: [birth year], 58 y.o.   MRN: [id number]  [facility] PROGRESS NOTE  Anita Reed [id number] DOB: 1961-04-19 DOA: 06/22/2019 PCP: Hubbard Hartshorn, FNP  HPI/Subjective: remains tachycardic (110-120's) even at rest Objective: Vitals:   06/27/19 1400 06/27/19 1527  BP: (!) 92/52   Pulse: (!) 117 (!) 110  Resp: 20   Temp: 98.2 F (36.8 C)   SpO2: 94%     Intake/Output Summary (Last 24 hours) at 06/27/2019 1602 Last data filed at 06/27/2019 1439 Gross per 24 hour  Intake 127.35 ml  Output -  Net 127.35 ml   Filed Weights   06/22/19 1530 06/22/19 2029 06/24/19 1500  Weight: 74.8 kg 76.2 kg 76.1 kg    ROS: Review of Systems  Constitutional: Negative for chills and fever.  Eyes: Negative for blurred vision.  Respiratory: Negative for cough and shortness of breath.   Cardiovascular: Negative for chest pain.  Gastrointestinal: Positive for abdominal pain. Negative for constipation, nausea and vomiting.  Genitourinary: Negative for dysuria.  Musculoskeletal: Negative for joint pain.  Neurological: Negative for dizziness and headaches.   Exam: Physical Exam  Constitutional: She is oriented to person, place, and time.  HENT:  Nose: No mucosal edema.  Mouth/Throat: No oropharyngeal exudate or posterior oropharyngeal edema.  Eyes: Pupils are equal, round, and reactive to light. Conjunctivae, EOM and lids are normal.  Neck: No JVD present. Carotid bruit is not present. No edema present. No thyroid mass and no thyromegaly present.  Cardiovascular: S1 normal and S2 normal. Exam reveals no gallop.  No murmur heard. Respiratory: No respiratory distress. She has decreased breath sounds in the right lower field and the left lower field. She has no wheezes. She has no rhonchi. She has no rales.  GI: Soft. Bowel sounds are normal. She exhibits distension. There is no abdominal tenderness.  Leaking peritoneal fluid from the paracentesis site   Musculoskeletal:     Right ankle: She exhibits swelling.     Left ankle: She exhibits swelling.  Lymphadenopathy:    She has no cervical adenopathy.  Neurological: She is alert and oriented to person, place, and time. No cranial nerve deficit.  Skin: Skin is warm. No rash noted. Nails show no clubbing.  Psychiatric: She has a normal mood and affect.      Data Reviewed: Basic Metabolic Panel: Recent Labs  Lab 06/23/19 0522  06/23/19 2306 06/24/19 0511 06/25/19 0418 06/26/19 0337 06/27/19 0626  NA 132*  --   --  133* 135 134* 134*  K 2.8*   < > 3.6 4.1 4.7 4.0 3.7  CL 97*  --   --  102 104 103 102  CO2 24  --   --  21* 25 26 26   GLUCOSE 102*  --   --  104* 98 125* 125*  BUN 6  --   --  <5* <5* 5* 5*  CREATININE 0.56  --   --  0.50 0.37* 0.50 0.54  CALCIUM 7.8*  --   --  8.0* 8.2* 8.0* 7.9*  MG 1.7  --  1.7 1.7 1.7 1.7 1.5*  PHOS 2.7  --  2.0* 2.0* 2.1* 3.2 2.8   < > = values in this interval not displayed.   Liver Function Tests: Recent Labs  Lab 06/22/19 1332 06/22/19 1600 06/23/19 0522 06/24/19 0511 06/25/19 0418  AST 44* 45* 36 38 36  ALT 20 19 17 15 15   ALKPHOS 458* 438* 385* 367* 411*  BILITOT 2.2* 2.1* 1.3* 1.4* 1.3*  PROT 5.7* 5.5* 4.9* 4.6* 4.5*  ALBUMIN 3.0* 2.9* 2.7* 2.5* 2.4*   CBC: Recent Labs  Lab 06/22/19 1332 06/22/19 1600 06/23/19 0522 06/24/19 0511 06/25/19 0418 06/27/19 1240  WBC 13.3* 12.3* 10.7* 11.0*  --  10.6*  NEUTROABS 9.9* 9.5*  --  7.7  --   --   HGB 10.4* 10.4* 8.2* 8.9* 8.3* 8.4*  HCT 33.0* 33.2* 26.5* 28.1*  --  26.5*  MCV 79.5* 79.0* 79.1* 78.5*  --  77.9*  PLT 223 222 177 180  --  188   BNP (last 3 results) Recent Labs    01/18/19 1220  BNP 771.0*    CBG: Recent Labs  Lab 06/23/19 0335  GLUCAP 125*    Recent Results (from the past 240 hour(s))  Blood culture (routine x 2)     Status: None   Collection Time: 06/22/19  4:20 PM   Specimen: BLOOD  Result Value Ref Range Status   Specimen Description BLOOD  BLOOD RIGHT ARM  Final   Special Requests   Final    BOTTLES DRAWN AEROBIC AND ANAEROBIC Blood Culture adequate volume   Culture   Final    NO GROWTH 5 DAYS Performed at [facility], [address] [place], [place] [address]    Report Status 06/27/2019 FINAL  Final  Blood culture (routine x 2)     Status: None   Collection Time: 06/22/19  4:20 PM   Specimen: BLOOD  Result Value Ref Range Status   Specimen Description BLOOD BLOOD LEFT HAND  Final   Special Requests   Final    BOTTLES DRAWN AEROBIC AND ANAEROBIC Blood Culture adequate volume   Culture   Final    NO GROWTH 5 DAYS Performed at [facility], [address] [place], [place] [address]    Report Status 06/27/2019 FINAL  Final  SARS Coronavirus 2 by RT PCR (hospital order, performed in [place] hospital lab) Nasopharyngeal Nasopharyngeal Swab     Status: None   Collection Time: 06/22/19  4:40 PM   Specimen: Nasopharyngeal Swab  Result Value Ref Range Status   SARS Coronavirus 2 NEGATIVE NEGATIVE Final    Comment: (NOTE) If result is NEGATIVE SARS-CoV-2 target nucleic acids are NOT DETECTED. The SARS-CoV-2 RNA is generally detectable in upper and lower  respiratory specimens during the acute phase of infection. The lowest  concentration of SARS-CoV-2 viral copies this assay can detect is 250  copies / mL. A negative result does not preclude SARS-CoV-2 infection  and should not be used as the sole basis for treatment or other  patient management decisions.  A negative result may occur with  improper specimen collection / handling, submission of specimen other  than nasopharyngeal swab, presence of viral mutation(s) within the  areas targeted by this assay, and inadequate number of viral copies  (<250 copies / mL). A negative result must be combined with clinical  observations, patient history, and epidemiological information. If result is POSITIVE SARS-CoV-2 target nucleic acids are  DETECTED. The SARS-CoV-2 RNA is generally detectable in upper and lower  respiratory specimens dur ing the acute phase of infection.  Positive  results are indicative of active infection with SARS-CoV-2.  Clinical  correlation with patient history and other diagnostic information is  necessary to determine patient infection status.  Positive results do  not rule out bacterial infection or co-infection with other viruses. If result is PRESUMPTIVE POSTIVE SARS-CoV-2 nucleic acids  MAY BE PRESENT.   A presumptive positive result was obtained on the submitted specimen  and confirmed on repeat testing.  While 2019 novel coronavirus  (SARS-CoV-2) nucleic acids may be present in the submitted sample  additional confirmatory testing may be necessary for epidemiological  and / or clinical management purposes  to differentiate between  SARS-CoV-2 and other Sarbecovirus currently known to infect humans.  If clinically indicated additional testing with an alternate test  methodology [phone]) is advised. The SARS-CoV-2 RNA is generally  detectable in upper and lower respiratory sp ecimens during the acute  phase of infection. The expected result is Negative. Fact Sheet for Patients:  StrictlyIdeas.no Fact Sheet for Healthcare Providers: BankingDealers.co.za This test is not yet approved or cleared by the [place] FDA and has been authorized for detection and/or diagnosis of SARS-CoV-2 by FDA under an Emergency Use Authorization (EUA).  This EUA will remain in effect (meaning this test can be used) for the duration of the COVID-19 declaration under Section 564(b)(1) of the Act, 21 U.S.C. section 360bbb-3(b)(1), unless the authorization is terminated or revoked sooner. Performed at [facility], [address], [place], [place] [address]   Urine Culture     Status: None   Collection Time: 06/22/19  6:09 PM   Specimen: Urine, Random   Result Value Ref Range Status   Specimen Description   Final    URINE, RANDOM Performed at [facility], [address], [place], [place] [address]    Special Requests   Final    Normal Performed at [facility], [address], [place], [place] [address]    Culture   Final    NO GROWTH Performed at [facility], [place] [address], [place], [place] [address]    Report Status 06/24/2019 FINAL  Final  Body fluid culture ( includes gram stain)     Status: None   Collection Time: 06/22/19  6:44 PM   Specimen: Peritoneal Cavity; Peritoneal Fluid  Result Value Ref Range Status   Specimen Description   Final    PERITONEAL CAVITY Performed at [facility], [address] [place], [place] [address]    Special Requests   Final    Normal Performed at [facility], [address] [place], [place] [address]    Gram Stain   Final    WBC PRESENT,BOTH PMN AND MONONUCLEAR NO ORGANISMS SEEN CYTOSPIN SMEAR    Culture   Final    NO GROWTH 3 DAYS Performed at [facility], [place] [address] [place], [place] [address]    Report Status 06/27/2019 FINAL  Final  MRSA PCR Screening     Status: None   Collection Time: 06/23/19  4:22 AM   Specimen: Nasopharyngeal  Result Value Ref Range Status   MRSA by PCR NEGATIVE NEGATIVE Final    Comment:        The GeneXpert MRSA Assay (FDA approved for NASAL specimens only), is one component of a comprehensive MRSA colonization surveillance program. It is not intended to diagnose MRSA infection nor to guide or monitor treatment for MRSA infections. Performed at [facility], [address] [place], [place] [address]   Body fluid culture with gram stain     Status: None (Preliminary result)   Collection Time: 06/23/19  1:46 PM   Specimen: Peritoneal Washings; Body Fluid  Result Value Ref Range Status   Specimen Description   Final    PERITONEAL FLUID Performed at  [facility],  [address], [place], [place] [address]    Special Requests   Final    NONE Performed at [facility], [address] [place], [place] [address]    Gram Stain   Final    RARE WBC PRESENT, PREDOMINANTLY MONONUCLEAR NO ORGANISMS SEEN    Culture   Final    NO GROWTH 2 DAYS Performed at [facility], [place] [address] [place],  [address]    Report Status PENDING  Incomplete     Studies: US Paracentesis  Result Date: 06/27/2019 INDICATION: 58 year old with decompensated cirrhosis and ascites. EXAM: ULTRASOUND GUIDED PARACENTESIS MEDICATIONS: None. COMPLICATIONS: None immediate. PROCEDURE: Informed written consent was obtained from the patient after a discussion of the risks, benefits and alternatives to treatment. A timeout was performed prior to the initiation of the procedure. Initial ultrasound scanning demonstrates a large amount of ascites within the right lower abdominal quadrant. The right lower abdomen was prepped and draped in the usual sterile fashion. 1% lidocaine was used for local anesthesia. Following this, a 6 Fr Safe-T-Centesis catheter was introduced. An ultrasound image was saved for documentation purposes. The paracentesis was performed. The catheter was removed and a dressing was applied. The patient tolerated the procedure well without immediate post procedural complication. FINDINGS: A total of approximately 3.9 L of yellow fluid was removed. IMPRESSION: Successful ultrasound-guided paracentesis yielding 3.9 liters of peritoneal fluid. Electronically Signed   By: Markus Daft M.D.   On: 06/27/2019 07:52    Scheduled Meds: . Chlorhexidine Gluconate Cloth  6 each Topical Daily  . [START ON 06/28/2019] digoxin  0.125 mg Oral Daily  . feeding supplement (ENSURE ENLIVE)  237 mL Oral BID BM  . FLUoxetine  10 mg Oral Daily  . furosemide  40 mg Oral Daily  . gabapentin  100 mg Oral BID  . lactobacillus  1 g Oral TID WC  . metoprolol succinate  25 mg  Oral Daily  . midodrine  5 mg Oral TID WC  . multivitamin with minerals  1 tablet Oral Daily  . pantoprazole  40 mg Oral BID  . spironolactone  50 mg Oral Daily  . sulfamethoxazole-trimethoprim  1 tablet Oral Daily   Continuous Infusions:   Assessment/Plan:  1. Clinical sepsis, present on admission.  SBP ruled out.  Case discussed with critical care specialist and he thinks this is atelectasis and not pneumonia.  Discontinue antibiotics.  2. Cirrhosis of the liver with ascites that is recurrent.  Mortality is very high within 2 years of developing ascites.  Hopefully can go back on Lasix and Aldactone soon.  Blood pressure is the limiting factor at this point.  Paracentesis yesterday.  Gastroenterology recommended Bactrim DS daily to prevent SBP.  Patient has completed vancomycin course for total of 14 days from the start date.  Patient is not a candidate for liver transplantation as her meld score is at 103.  GI is recommending to add low-dose Lasix and Aldactone 50 mg once daily.   3. hypotension.  Patient states that her blood pressure has been low for a while. Midodrine started to help with BP 4. Atrial fibrillation with rapid ventricular response.  Appreciate cardiology input.  Continue Digoxin, increase dose of metoprolol to 25 mg to see if it helps with HR. Not a candidate for anticoagulation currently. 5. Hypokalemia and hypomagnesemia.  Replete and recheck 6. Hepatitis C.  As per husband this was treated. 7. Peritoneal fluid leakage from the paracentesis  site.-As patient is at high risk for spontaneous bacterial peritonitis, surgery has seen the patient and closed the  leakage of the peritoneal fluid.  Surgery signed off 8. Disposition Home health ask home health PT.  Outpatient palliative care is recommended by palliative care specialist  Code Status:     Code Status Orders  (From admission, onward)         Start     Ordered   06/22/19 2023  Do not attempt resuscitation (DNR)   Continuous    Question Answer Comment  In the event of cardiac or respiratory ARREST Do not call a "code blue"   In the event of cardiac or respiratory ARREST Do not perform Intubation, CPR, defibrillation or ACLS   In the event of cardiac or respiratory ARREST Use medication by any route, position, wound care, and other measures to relive pain and suffering. May use oxygen, suction and manual treatment of airway obstruction as needed for comfort.   Comments RN may pronounce      06/22/19 2023        Code Status History    Date Active Date Inactive Code Status Order ID Comments User Context   06/01/2019 2148 06/11/2019 2150 Full Code [id number]  Bettey Costa, MD Inpatient   01/28/2019 1920 01/31/2019 1827 Full Code [id number]  Otila Back, MD ED   01/18/2019 1651 01/19/2019 1946 DNR [id number]  Otila Back, MD ED   Advance Care Planning Activity     Family Communication: Spoke with husband on the phone Disposition Plan: over next 1-2 days depending on clinical condition, HR control and cardio w/up  Consultants:  Cardiology-[facility]  Gastroenterology  Antibiotics:  Rocephin  Flagyl  Time spent: 35  minutes, case discussed with Cardiology  [place]

## 2019-06-28 DIAGNOSIS — K7290 Hepatic failure, unspecified without coma: Secondary | ICD-10-CM

## 2019-06-28 DIAGNOSIS — E43 Unspecified severe protein-calorie malnutrition: Secondary | ICD-10-CM

## 2019-06-28 LAB — COMPREHENSIVE METABOLIC PANEL
ALT: 16 U/L (ref 0–44)
AST: 38 U/L (ref 15–41)
Albumin: 2.4 g/dL — ABNORMAL LOW (ref 3.5–5.0)
Alkaline Phosphatase: 402 U/L — ABNORMAL HIGH (ref 38–126)
Anion gap: 5 (ref 5–15)
BUN: 6 mg/dL (ref 6–20)
CO2: 28 mmol/L (ref 22–32)
Calcium: 8.1 mg/dL — ABNORMAL LOW (ref 8.9–10.3)
Chloride: 102 mmol/L (ref 98–111)
Creatinine, Ser: 0.42 mg/dL — ABNORMAL LOW (ref 0.44–1.00)
GFR calc Af Amer: >60 mL/min (ref >60)
GFR calc non Af Amer: >60 mL/min (ref >60)
Glucose, Bld: 109 mg/dL — ABNORMAL HIGH (ref 70–99)
Potassium: 4.5 mmol/L (ref 3.5–5.1)
Sodium: 135 mmol/L (ref 135–145)
Total Bilirubin: 1.2 mg/dL (ref 0.3–1.2)
Total Protein: 4.5 g/dL — ABNORMAL LOW (ref 6.5–8.1)

## 2019-06-28 LAB — PHOSPHORUS: Phosphorus: 2.9 mg/dL (ref 2.5–4.6)

## 2019-06-28 LAB — BODY FLUID CULTURE: Culture: NO GROWTH

## 2019-06-28 LAB — CBC
HCT: 24.1 % — ABNORMAL LOW (ref 36.0–46.0)
Hemoglobin: 7.6 g/dL — ABNORMAL LOW (ref 12.0–15.0)
MCH: 24.5 pg — ABNORMAL LOW (ref 26.0–34.0)
MCHC: 31.5 g/dL (ref 30.0–36.0)
MCV: 77.7 fL — ABNORMAL LOW (ref 80.0–100.0)
Platelets: 210 10*3/uL (ref 150–400)
RBC: 3.1 MIL/uL — ABNORMAL LOW (ref 3.87–5.11)
RDW: 18.8 % — ABNORMAL HIGH (ref 11.5–15.5)
WBC: 9.6 10*3/uL (ref 4.0–10.5)
nRBC: 0 % (ref 0.0–0.2)

## 2019-06-28 LAB — MAGNESIUM: Magnesium: 1.7 mg/dL (ref 1.7–2.4)

## 2019-06-28 LAB — DIGOXIN LEVEL: Digoxin Level: 1.3 ng/mL (ref 0.8–2.0)

## 2019-06-28 MED ORDER — SPIRONOLACTONE 50 MG PO TABS: 50.0000 mg | ORAL_TABLET | Freq: Every day | ORAL | 0 refills | Status: DC

## 2019-06-28 MED ORDER — MAGNESIUM OXIDE 400 (241.3 MG) MG PO TABS
400.0000 mg | ORAL_TABLET | Freq: Once | ORAL | Status: AC
  Administered 2019-06-28: 08:00:00 400 mg via ORAL
  Filled 2019-06-28: qty 1

## 2019-06-28 MED ORDER — GABAPENTIN 100 MG PO CAPS: 100.0000 mg | ORAL_CAPSULE | Freq: Two times a day (BID) | ORAL | 0 refills | Status: DC

## 2019-06-28 MED ORDER — RIFAXIMIN 550 MG PO TABS: 550.0000 mg | ORAL_TABLET | Freq: Two times a day (BID) | ORAL | Status: DC

## 2019-06-28 MED ORDER — METOPROLOL SUCCINATE ER 25 MG PO TB24: 25.0000 mg | ORAL_TABLET | Freq: Every day | ORAL | 0 refills | Status: DC

## 2019-06-28 MED ORDER — FLUOXETINE HCL 10 MG PO CAPS: 10.0000 mg | ORAL_CAPSULE | Freq: Every day | ORAL | 3 refills | Status: DC

## 2019-06-28 MED ORDER — MAGNESIUM SULFATE 2 GM/50ML IV SOLN
2.0000 g | Freq: Once | INTRAVENOUS | Status: AC
  Administered 2019-06-28: 08:00:00 2 g via INTRAVENOUS
  Filled 2019-06-28: qty 50

## 2019-06-28 MED ORDER — FUROSEMIDE 40 MG PO TABS: 20.0000 mg | ORAL_TABLET | Freq: Every day | ORAL | 0 refills | Status: DC

## 2019-06-28 MED ORDER — DIGOXIN 125 MCG PO TABS: 0.1250 mg | ORAL_TABLET | Freq: Every day | ORAL | 0 refills | Status: DC

## 2019-06-28 MED ORDER — MIDODRINE HCL 5 MG PO TABS: 5.0000 mg | ORAL_TABLET | Freq: Three times a day (TID) | ORAL | 0 refills | Status: DC

## 2019-06-28 MED ORDER — TIZANIDINE HCL 2 MG PO TABS
2.0000 mg | ORAL_TABLET | Freq: Every day | ORAL | Status: DC
  Filled 2019-06-28: qty 1

## 2019-06-28 NOTE — Plan of Care (Signed)
  Problem: Clinical Measurements: Goal: Ability to maintain clinical measurements within normal limits will improve Outcome: Progressing   Pt understands her POC and has no further questions.     Problem: Clinical Measurements: Goal: Will remain free from infection Outcome: Progressing S/Sx of infection monitor and assessed q-shift/ PRN, along with VS.  Pt has been afebrile thus far.  Will continue to monitor and assess.    Problem: Activity: Goal: Risk for activity intolerance will decrease Outcome: Progressing  PT able to ambulate to the BR independently.   Problem: Pain Managment: Goal: General experience of comfort will improve Outcome: Progressing Pt has c/o 6-8/10 anterior headache and bilateral abdominal pain describing it as a constant ache.  Reiterated pain scale so she could adequately rate her pain.  Discussed nonpharmacological methods to help reduce s/sx of pain.  Interventions given per pt's request and MD's orders.    Problem: Safety: Goal: Ability to remain free from injury will improve Outcome: Progressing Instructed pt to utilize RN call light for assistance.  Hourly rounds performed.  Bed in lowest position, locked with two upper side rails engaged.  Belongings and call light within reach.    Problem: Skin Integrity: Goal: Risk for impaired skin integrity will decrease Outcome: Progressing Skin integrity monitored and assessed q-shift/ PRN.  Instructed pt to turn q2 hours to prevent further skin impairment.  Tubes and drains assessed for device related pressure sores.  Will continue to monitor and assess.

## 2019-06-28 NOTE — Consult Note (Signed)
PHARMACY CONSULT NOTE - FOLLOW UP  Pharmacy Consult for Electrolyte Monitoring and Replacement   Recent Labs: Potassium (mmol/L)  Date Value  06/28/2019 4.5   Magnesium (mg/dL)  Date Value  06/28/2019 1.7   Calcium (mg/dL)  Date Value  06/28/2019 8.1 (L)   Albumin (g/dL)  Date Value  06/28/2019 2.4 (L)   Phosphorus (mg/dL)  Date Value  06/28/2019 2.9   Sodium (mmol/L)  Date Value  06/28/2019 135     Assessment: Pharmacy was consulted for electrolyte management.was on vancomycin for Cdiff 05/2019-finished course Hx recurrent ascites Patient on lasix 40 po daily, digoxin, spironolactone, bactrim for SBP prophylaxis  K 4.5, Mag 1.7  Phos 2.9 Scr 0.42  Goal of Therapy:  Electrolytes WNL Per cards goal K ~4, Mag ~2  Plan:  Mag sulfate 2 g IV x1. Will order Magnesium Oxide 400 mg x1 dose.  Will recheck w/ AM labs.  Pharmacy will continue to follow.   Rowland Lathe, PharmD Clinical Pharmacist 06/28/2019 7:20 AM

## 2019-06-28 NOTE — Discharge Instructions (Signed)
Atrial Flutter  Atrial flutter is a type of abnormal heart rhythm (arrhythmia). The heart has an electrical system that tells the heart how to beat. In atrial flutter, the signals move rapidly in the top chambers of the heart (the atria). This makes your heart beat very fast. Atrial flutter can come and go, or it can be permanent. If this condition is not treated it can cause serious complications, such as stroke or weakened heart muscle (cardiomyopathy). What are the causes? This condition may be caused by:  A heart condition or problem, such as: ? A heart attack. ? Heart failure. ? A heart valve problem. ? Heart surgery.  A lung problem, such as: ? A blood clot in the lungs (pulmonary embolism, or PE). ? Chronic obstructive pulmonary disease.  Poorly controlled high blood pressure (hypertension).  Overactive thyroid (hyperthyroidism).  Caffeine.  Some decongestant cold medicines.  Low levels of minerals called electrolytes in the blood.  Cocaine. What increases the risk? You are more likely to develop this condition if:  You are an elderly adult.  You are a man.  You are obese.  You have obstructive sleep apnea.  You have a family history of atrial flutter.  You have diabetes. What are the signs or symptoms? Symptoms of this condition include:  A feeling that your heart is pounding or racing (palpitations).  Shortness of breath.  Chest pain.  Feeling light-headed.  Dizziness.  Fainting.  Low blood pressure (hypotension).  Fatigue. Sometimes there are no symptoms associated with arrhythmia. How is this diagnosed? This condition may be diagnosed based on:  An electrocardiogram (ECG). This is a test that records the electrical signals in the heart.  Ambulatory cardiac monitoring. This is a small recording device that is connected by wires to flat, sticky disks (electrodes) that are attached to your chest.  An echocardiogram. This is a test that uses  sound waves to create pictures of your heart.  A transesophageal echocardiogram (TEE). In this test, a device is placed down your esophagus. This device then uses sounds waves to create even closer pictures of your heart.  Stress test. This test records your heartbeat while you exercise and checks to see if the heart muscle is receiving adequate blood supply. How is this treated? This condition may be treated with:  Medicines to: ? Make your heart beat more slowly. ? Keep your heart in normal rhythm. ? Prevent a stroke.  Cardioversion. This uses medicines or an electrical shock to make the heart beat normally.  Ablation. This destroys the heart tissue that is causing the problem. In some cases, your health care provider will treat other underlying conditions. Follow these instructions at home: Medicines  Take over-the-counter and prescription medicines only as told by your health care provider. ? Make sure you take your medicines exactly as told by your health care provider. ? Do not miss any doses.  Do not take any new medicines without talking to your health care provider. Lifestyle  Eat heart-healthy foods. Talk with a dietitian to make an eating plan that is right for you.  Do not use any products that contain nicotine or tobacco, such as cigarettes and e-cigarettes. If you need help quitting, ask your health care provider.  Limit alcohol intake to no more than 1 drink per day for nonpregnant women and 2 drinks per day for men. One drink equals 12 oz of beer, 5 oz of wine, or 1 oz of hard liquor.  Try to reduce any  stress. Stress can make your symptoms worse.  Get screened for sleep apnea. If you have the condition, work with your health care provider to find a treatment that works for you.  Do not use drugs.  Avoid excessive caffeine. General instructions  Lose weight if your health care provider tells you to do that.  Keep all follow-up visits as told by your health  care provider. This is important. Contact a health care provider if:  Your symptoms get worse.  You notice that your palpitations are increasing. Get help right away if:  You have any symptoms of a stroke. "BE FAST" is an easy way to remember the main warning signs of a stroke: ? B - Balance. Signs are dizziness, sudden trouble walking, or loss of balance. ? E - Eyes. Signs are trouble seeing or a sudden change in vision. ? F - Face. Signs are sudden weakness or numbness of the face, or the face or eyelid drooping on one side. ? A - Arms. Signs are weakness or numbness in an arm. This happens suddenly and usually on one side of the body. ? S - Speech. Signs are sudden trouble speaking, slurred speech, or trouble understanding what people say. ? T - Time. Time to call emergency services. Write down what time symptoms started.  You have other signs of a stroke, such as: ? A sudden, severe headache with no known cause. ? Nausea or vomiting. ? Seizure.  You have additional symptoms, such as: ? Fainting. ? Shortness of breath. ? Pain or pressure in your chest. ? Suddenly feeling nauseous or suddenly vomiting. ? Increased sweating with no known cause.  These symptoms may represent a serious problem that is an emergency. Do not wait to see if the symptoms will go away. Get medical help right away. Call your local emergency services (911 in the [place] Do not drive yourself to the hospital. Summary  Atrial flutter is an abnormal heart rhythm that can give you symptoms of palpitations, shortness of breath, or fatigue.  Atrial flutter is often treated with medicines to keep your heart in a normal rhythm and to prevent a stroke.  You should seek immediate help if you cannot catch your breath, have chest pain or pressure, or have weakness, especially on one side of your body. This information is not intended to replace advice given to you by your health care provider. Make sure you discuss any  questions you have with your health care provider. Document Released: 01/02/2009 Document Revised: 05/05/2018 Document Reviewed: 05/19/2017 Elsevier Patient Education  2020 [employer].

## 2019-06-28 NOTE — TOC Initial Note (Signed)
Transition of Care [facility]) - Initial/Assessment Note    Patient Details  Name: Anita Reed MRN: [id number] Date of Birth: 10/10/1960  [facility] CM/SW Contact:    Shelbie Hutching, RN Phone Number: 06/28/2019, 9:02 AM  Clinical Narrative:                 Patient admitted with sepsis, patient has a history of alcoholic liver cirrhosis.  Patient is from home and lives with her husband.  Patient reports her husband works and she will need help during the day when she goes home.  Patient would like home health services and outpatient palliative at discharge.  Flo Shanks with [facility] already aware of OP palliative referral.  Patient chooses [facility] for home health services.  Floydene Flock with [facility] given referral for RN, PT, aide, and SW.   TOC team will cont to follow until discharge and assess for additional needs.   Expected Discharge Plan: [place] Barriers to Discharge: Continued Medical Work up   Patient Goals and CMS Choice Patient states their goals for this hospitalization and ongoing recovery are:: Be able to go home and have help at home CMS Medicare.gov Compare Post Acute Care list provided to:: Patient Choice offered to / list presented to : Patient  Expected Discharge Plan and Services Expected Discharge Plan: [place]   Discharge Planning Services: CM Consult Post Acute Care Choice: [place] arrangements for the past 2 months: [place] Arranged: RN, Nurse's Aide, Social Work, PT [place] Agency: [place] [facility] ([place]) Date [place]: 06/28/19 Time HH Agency Contacted: 0900 Representative spoke with at [place]: Floydene Flock  Prior Living Arrangements/Services Living arrangements for the past 2 months: [place] with:: Spouse Patient language and need for interpreter reviewed:: No Do you feel safe  going back to the place where you live?: Yes      Need for Family Participation in Patient Care: Yes (Comment)(liver cirrhosis) Care giver support system in place?: Yes (comment)(husband)   Criminal Activity/Legal Involvement Pertinent to Current Situation/Hospitalization: No - Comment as needed  Activities of Daily Living Home Assistive Devices/Equipment: Environmental consultant (specify type) ADL Screening (condition at time of admission) Patient's cognitive ability adequate to safely complete daily activities?: Yes Is the patient deaf or have difficulty hearing?: No Does the patient have difficulty seeing, even when wearing glasses/contacts?: No Does the patient have difficulty concentrating, remembering, or making decisions?: No Patient able to express need for assistance with ADLs?: Yes Does the patient have difficulty dressing or bathing?: No Independently performs ADLs?: Yes (appropriate for developmental age) Does the patient have difficulty walking or climbing stairs?: Yes Weakness of Legs: Both Weakness of Arms/Hands: None  Permission Sought/Granted Permission sought to share information with : Case Manager, Other (comment) Permission granted to share information with : Yes, Verbal Permission Granted     Permission granted to share info w AGENCY: [facility]        Emotional Assessment Appearance:: Appears older than stated age Attitude/Demeanor/Rapport: Engaged Affect (typically observed): Accepting Orientation: : Oriented to Self, Oriented to Place, Oriented to  Time, Oriented to Situation Alcohol / Substance Use: Not Applicable Psych Involvement: No (comment)  Admission diagnosis:  Hypokalemia [E87.6] Hypomagnesemia [E83.42] Acute abdominal pain [R10.9]  Sepsis, due to unspecified organism, unspecified whether acute organ dysfunction present ([place]) [A41.9] Sepsis ([place]) [A41.9] Patient Active Problem List   Diagnosis Date Noted  . Goals of care, counseling/discussion   .  Palliative care encounter   . Muscle cramping 06/22/2019  . Alcohol abuse 06/22/2019  . Sepsis ([place]) 06/22/2019  . GIB (gastrointestinal bleeding) 06/01/2019  . GI bleeding 06/01/2019  . History of lymphoma 02/26/2019  . Atrial flutter ([place]) 02/07/2019  . Alcoholic cirrhosis of liver with ascites ([place]) 02/07/2019  . Persistent atrial fibrillation ([place]) 02/07/2019  . Anemia 02/07/2019  . Anorexia 02/07/2019  . GI bleed 01/28/2019  . Ascites 01/18/2019  . Cancer [facility])    PCP:  Hubbard Hartshorn, FNP Pharmacy:   [facility] [address] [place] [address] [place] [address] Phone: [phone] Fax: [phone]     Social Determinants of Health (SDOH) Interventions    Readmission Risk Interventions No flowsheet data found.

## 2019-06-28 NOTE — Progress Notes (Signed)
Anita Darby, MD [address]  [place]  [place], [place] [address]  Main: [phone]  Fax: [phone] Pager: [phone]   Subjective: Heart rate still rises with physical activity.  Diuresing well.  Blood pressure has been maintained as expected.  She reports feeling better.  Abdomen less distended.  Patient is requesting to start Zanaflex at bedtime   Objective: Vital signs in last 24 hours: Vitals:   06/27/19 1641 06/27/19 2014 06/28/19 0536 06/28/19 0954  BP:  (!) 99/52 96/66 (!) 98/55  Pulse: 98 (!) 123 (!) 121 95  Resp:  20 20   Temp:  99.3 F (37.4 C) 98.6 F (37 C)   TempSrc:  Oral Oral   SpO2:  98% 95%   Weight:      Height:       Weight change:   Intake/Output Summary (Last 24 hours) at 06/28/2019 1659 Last data filed at 06/28/2019 1300 Gross per 24 hour  Intake 120 ml  Output -  Net 120 ml     Exam: Heart:: Tachycardia, regular rhythm Lungs: normal and clear to auscultation Abdomen: Soft, mildly distended, nontender   Lab Results: CBC Latest Ref Rng & Units 06/28/2019 06/27/2019 06/25/2019  WBC 4.0 - 10.5 K/uL 9.6 10.6(H) -  Hemoglobin 12.0 - 15.0 g/dL 7.6(L) 8.4(L) 8.3(L)  Hematocrit 36.0 - 46.0 % 24.1(L) 26.5(L) -  Platelets 150 - 400 K/uL 210 188 -   CMP Latest Ref Rng & Units 06/28/2019 06/27/2019 06/26/2019  Glucose 70 - 99 mg/dL 109(H) 125(H) 125(H)  BUN 6 - 20 mg/dL 6 5(L) 5(L)  Creatinine 0.44 - 1.00 mg/dL 0.42(L) 0.54 0.50  Sodium 135 - 145 mmol/L 135 134(L) 134(L)  Potassium 3.5 - 5.1 mmol/L 4.5 3.7 4.0  Chloride 98 - 111 mmol/L 102 102 103  CO2 22 - 32 mmol/L 28 26 26   Calcium 8.9 - 10.3 mg/dL 8.1(L) 7.9(L) 8.0(L)  Total Protein 6.5 - 8.1 g/dL 4.5(L) - -  Total Bilirubin 0.3 - 1.2 mg/dL 1.2 - -  Alkaline Phos 38 - 126 U/L 402(H) - -  AST 15 - 41 U/L 38 - -  ALT 0 - 44 U/L 16 - -    Micro Results: Recent Results (from the past 240 hour(s))  Blood culture (routine x 2)     Status: None   Collection Time:  06/22/19  4:20 PM   Specimen: BLOOD  Result Value Ref Range Status   Specimen Description BLOOD BLOOD RIGHT ARM  Final   Special Requests   Final    BOTTLES DRAWN AEROBIC AND ANAEROBIC Blood Culture adequate volume   Culture   Final    NO GROWTH 5 DAYS Performed at [facility], [address] [place], [place] [address]    Report Status 06/27/2019 FINAL  Final  Blood culture (routine x 2)     Status: None   Collection Time: 06/22/19  4:20 PM   Specimen: BLOOD  Result Value Ref Range Status   Specimen Description BLOOD BLOOD LEFT HAND  Final   Special Requests   Final    BOTTLES DRAWN AEROBIC AND ANAEROBIC Blood Culture adequate volume   Culture   Final    NO GROWTH 5 DAYS Performed at [facility], [address], [place], [place] [address]    Report Status 06/27/2019 FINAL  Final  SARS Coronavirus 2 by RT PCR (hospital order, performed in [facility] hospital lab) Nasopharyngeal Nasopharyngeal Swab     Status: None  Collection Time: 06/22/19  4:40 PM   Specimen: Nasopharyngeal Swab  Result Value Ref Range Status   SARS Coronavirus 2 NEGATIVE NEGATIVE Final    Comment: (NOTE) If result is NEGATIVE SARS-CoV-2 target nucleic acids are NOT DETECTED. The SARS-CoV-2 RNA is generally detectable in upper and lower  respiratory specimens during the acute phase of infection. The lowest  concentration of SARS-CoV-2 viral copies this assay can detect is 250  copies / mL. A negative result does not preclude SARS-CoV-2 infection  and should not be used as the sole basis for treatment or other  patient management decisions.  A negative result may occur with  improper specimen collection / handling, submission of specimen other  than nasopharyngeal swab, presence of viral mutation(s) within the  areas targeted by this assay, and inadequate number of viral copies  (<250 copies / mL). A negative result must be combined with clinical  observations, patient history, and  epidemiological information. If result is POSITIVE SARS-CoV-2 target nucleic acids are DETECTED. The SARS-CoV-2 RNA is generally detectable in upper and lower  respiratory specimens dur ing the acute phase of infection.  Positive  results are indicative of active infection with SARS-CoV-2.  Clinical  correlation with patient history and other diagnostic information is  necessary to determine patient infection status.  Positive results do  not rule out bacterial infection or co-infection with other viruses. If result is PRESUMPTIVE POSTIVE SARS-CoV-2 nucleic acids MAY BE PRESENT.   A presumptive positive result was obtained on the submitted specimen  and confirmed on repeat testing.  While 2019 novel coronavirus  (SARS-CoV-2) nucleic acids may be present in the submitted sample  additional confirmatory testing may be necessary for epidemiological  and / or clinical management purposes  to differentiate between  SARS-CoV-2 and other Sarbecovirus currently known to infect humans.  If clinically indicated additional testing with an alternate test  methodology [phone]) is advised. The SARS-CoV-2 RNA is generally  detectable in upper and lower respiratory sp ecimens during the acute  phase of infection. The expected result is Negative. Fact Sheet for Patients:  StrictlyIdeas.no Fact Sheet for Healthcare Providers: BankingDealers.co.za This test is not yet approved or cleared by the [place] FDA and has been authorized for detection and/or diagnosis of SARS-CoV-2 by FDA under an Emergency Use Authorization (EUA).  This EUA will remain in effect (meaning this test can be used) for the duration of the COVID-19 declaration under Section 564(b)(1) of the Act, 21 U.S.C. section 360bbb-3(b)(1), unless the authorization is terminated or revoked sooner. Performed at [facility], [address] [place], [place] [address]   Urine  Culture     Status: None   Collection Time: 06/22/19  6:09 PM   Specimen: Urine, Random  Result Value Ref Range Status   Specimen Description   Final    URINE, RANDOM Performed at [facility], [address] [place], [place] [address]    Special Requests   Final    Normal Performed at [facility], [address], [place], [place] [address]    Culture   Final    NO GROWTH Performed at [facility], [place] [address] [place], [place] [address]    Report Status 06/24/2019 FINAL  Final  Body fluid culture ( includes gram stain)     Status: None   Collection Time: 06/22/19  6:44 PM   Specimen: Peritoneal Cavity; Peritoneal Fluid  Result Value Ref Range Status   Specimen Description  Final    PERITONEAL CAVITY Performed at [facility], [address] [place], [place] [address]    Special Requests   Final    Normal Performed at [facility], [address] [place], [place] [address]    Gram Stain   Final    WBC PRESENT,BOTH PMN AND MONONUCLEAR NO ORGANISMS SEEN CYTOSPIN SMEAR    Culture   Final    NO GROWTH 3 DAYS Performed at [facility], [place] [address], [place], [place] [address]    Report Status 06/27/2019 FINAL  Final  MRSA PCR Screening     Status: None   Collection Time: 06/23/19  4:22 AM   Specimen: Nasopharyngeal  Result Value Ref Range Status   MRSA by PCR NEGATIVE NEGATIVE Final    Comment:        The GeneXpert MRSA Assay (FDA approved for NASAL specimens only), is one component of a comprehensive MRSA colonization surveillance program. It is not intended to diagnose MRSA infection nor to guide or monitor treatment for MRSA infections. Performed at [facility], [address] [place], [place] [address]   Body fluid culture with gram stain     Status: None   Collection Time: 06/23/19  1:46 PM   Specimen: Peritoneal Washings; Body Fluid  Result Value Ref Range Status   Specimen  Description   Final    PERITONEAL FLUID Performed at [facility], [address] [place], [place] [address]    Special Requests   Final    NONE Performed at [facility], [address] [place], [place] [address]    Gram Stain   Final    RARE WBC PRESENT, PREDOMINANTLY MONONUCLEAR NO ORGANISMS SEEN    Culture   Final    NO GROWTH 3 DAYS Performed at [facility], [place] [address] [place], [place] [address]    Report Status 06/28/2019 FINAL  Final   Studies/Results: US Paracentesis  Result Date: 06/27/2019 INDICATION: 58 year old with decompensated cirrhosis and ascites. EXAM: ULTRASOUND GUIDED PARACENTESIS MEDICATIONS: None. COMPLICATIONS: None immediate. PROCEDURE: Informed written consent was obtained from the patient after a discussion of the risks, benefits and alternatives to treatment. A timeout was performed prior to the initiation of the procedure. Initial ultrasound scanning demonstrates a large amount of ascites within the right lower abdominal quadrant. The right lower abdomen was prepped and draped in the usual sterile fashion. 1% lidocaine was used for local anesthesia. Following this, a 6 Fr Safe-T-Centesis catheter was introduced. An ultrasound image was saved for documentation purposes. The paracentesis was performed. The catheter was removed and a dressing was applied. The patient tolerated the procedure well without immediate post procedural complication. FINDINGS: A total of approximately 3.9 L of yellow fluid was removed. IMPRESSION: Successful ultrasound-guided paracentesis yielding 3.9 liters of peritoneal fluid. Electronically Signed   By: Markus Daft M.D.   On: 06/27/2019 07:52   Medications:  I have reviewed the patient's current medications. Prior to Admission:  Medications Prior to Admission  Medication Sig Dispense Refill Last Dose  . furosemide (LASIX) 40 MG tablet Take 40 mg by mouth 2 (two) times daily.   Past Week at Unknown time   . pantoprazole (PROTONIX) 40 MG tablet Take 1 tablet (40 mg total) by mouth 2 (two) times daily. 60 tablet 11 Past Week at Unknown time  . traMADol (ULTRAM) 50 MG tablet Take 1 tablet (50 mg total) by mouth 2 (two) times daily for 7  days. 14 tablet 0   . baclofen (LIORESAL) 10 MG tablet Take 1 tablet (10 mg total) by mouth 3 (three) times daily. Muscle Relaxer 60 each 0    Scheduled: . Chlorhexidine Gluconate Cloth  6 each Topical Daily  . digoxin  0.125 mg Oral Daily  . feeding supplement (ENSURE ENLIVE)  237 mL Oral BID BM  . FLUoxetine  10 mg Oral Daily  . furosemide  40 mg Oral Daily  . gabapentin  100 mg Oral BID  . lactobacillus  1 g Oral TID WC  . metoprolol succinate  25 mg Oral Daily  . midodrine  5 mg Oral TID WC  . multivitamin with minerals  1 tablet Oral Daily  . pantoprazole  40 mg Oral BID  . rifaximin  550 mg Oral BID  . spironolactone  50 mg Oral Daily  . sulfamethoxazole-trimethoprim  1 tablet Oral Daily  . tiZANidine  2 mg Oral QHS   Continuous: . [START ON 06/29/2019] iron sucrose     [id number], HYDROmorphone (DILAUDID) injection, ondansetron **OR** ondansetron (ZOFRAN) IV, oxyCODONE Anti-infectives (From admission, onward)   Start     Dose/Rate Route Frequency Ordered Stop   06/28/19 2200  rifaximin (XIFAXAN) tablet 550 mg     550 mg Oral 2 times daily 06/28/19 1659     06/26/19 1000  vancomycin (VANCOCIN) 50 mg/mL oral solution 125 mg  Status:  Discontinued    Note to Pharmacy: Stop vanc if 14 day course is done from the start date   thx   125 mg Oral Daily 06/25/19 0939 06/25/19 1448   06/24/19 1500  vancomycin (VANCOCIN) 50 mg/mL oral solution 125 mg  Status:  Discontinued     125 mg Oral Daily 06/24/19 1315 06/25/19 0939   06/24/19 1400  sulfamethoxazole-trimethoprim (BACTRIM DS) 800-160 MG per tablet 1 tablet     1 tablet Oral Daily 06/24/19 1326     06/24/19 1330  sulfamethoxazole-trimethoprim (BACTRIM) 400-80 MG per tablet 1 tablet  Status:   Discontinued     1 tablet Oral Daily 06/24/19 1315 06/24/19 1326   06/23/19 0800  cefTRIAXone (ROCEPHIN) 2 g in sodium chloride 0.9 % 100 mL IVPB  Status:  Discontinued     2 g 200 mL/hr over 30 Minutes Intravenous Every 24 hours 06/22/19 1919 06/24/19 1307   06/22/19 2000  metroNIDAZOLE (FLAGYL) IVPB 500 mg  Status:  Discontinued     500 mg 100 mL/hr over 60 Minutes Intravenous Every 8 hours 06/22/19 1915 06/24/19 1308   06/22/19 1800  cefTRIAXone (ROCEPHIN) 2 g in sodium chloride 0.9 % 100 mL IVPB     2 g 200 mL/hr over 30 Minutes Intravenous  Once 06/22/19 1749 06/22/19 1938     Scheduled Meds: . Chlorhexidine Gluconate Cloth  6 each Topical Daily  . digoxin  0.125 mg Oral Daily  . feeding supplement (ENSURE ENLIVE)  237 mL Oral BID BM  . FLUoxetine  10 mg Oral Daily  . furosemide  40 mg Oral Daily  . gabapentin  100 mg Oral BID  . lactobacillus  1 g Oral TID WC  . metoprolol succinate  25 mg Oral Daily  . midodrine  5 mg Oral TID WC  . multivitamin with minerals  1 tablet Oral Daily  . pantoprazole  40 mg Oral BID  . rifaximin  550 mg Oral BID  . spironolactone  50 mg Oral Daily  . sulfamethoxazole-trimethoprim  1 tablet Oral Daily  . tiZANidine  2  mg Oral QHS   Continuous Infusions: . [START ON 06/29/2019] iron sucrose     PRN Meds:.baclofen, HYDROmorphone (DILAUDID) injection, ondansetron **OR** ondansetron (ZOFRAN) IV, oxyCODONE   Assessment: Active Problems:   Atrial flutter (HCC)   Sepsis ([place])   Goals of care, counseling/discussion   Palliative care encounter  Decompensated cirrhosis of liver complicated by SBP Also, C. difficile colitis on vancomycin Tolerating diuretics well   Plan: Ascites; with history of SBP in 05/2019, currently no evidence of SBP Continue Bactrim DS long-term for SBP prophylaxis Continue midodrine 5 mg 3 times daily as outpatient Continue Lasix 40 mg and spironolactone 50 mg daily with close monitoring of renal function and  electrolytes, patient is responding well to diuresis Low-sodium diet Paracentesis site has been sutured by surgery, appreciate their help Okay to check labs as needed only Continue rifaximin for hepatic encephalopathy  History of C. difficile colitis in 05/2019 Patient finished oral vancomycin for 2 weeks total  A. fib with RVR: Remains tachycardic,  currently on digoxin 0.125 mg once daily.  Added metoprolol 25 mg by cardiology Okay with anticoagulation from GI standpoint if it is absolutely deemed necessary Patient does have esophageal varices but did not have variceal bleeding in the past Appreciate cardiology input  History of esophageal varices Variceal surveillance as outpatient On metoprolol 25 mg daily  Iron deficiency anemia Ferritin is low Started on parenteral iron  Muscle spasms Continue baclofen Started on low-dose Zanaflex 2 mg at bedtime, recommend not to increase the dose further due to cirrhosis  Liver transplant candidacy Patient's current MELD-Na is only 16.  She does not meet the criteria for liver transplant yet.  Explained the process to patient.  However, she would like to be referred to [facility] transplant hepatology.  Outpatient referral will be placed Palliative care is following  GI will sign off at this time, she should follow-up with me in 3 to 4 weeks after discharge.  Please call us back with questions or concerns    LOS: 6 days   Anita Reed 06/28/2019, 4:59 PM

## 2019-06-28 NOTE — Progress Notes (Signed)
IV removed before discharge. Educated patient on medications and went over discharge paper work. Patient stated that she did understand and had no questions.

## 2019-06-28 NOTE — Progress Notes (Signed)
Patient ID: Anita Reed, female   DOB: 09-04-1960, 58 y.o.   MRN: [id number]  [facility] PROGRESS NOTE  Corrine Stankevich [id number] DOB: 1961-05-04 DOA: 06/22/2019 PCP: Hubbard Hartshorn, FNP  HPI/Subjective: HR some better controlled than y'day, BP low. Wants to go Objective: Vitals:   06/28/19 0536 06/28/19 0954  BP: 96/66 (!) 98/55  Pulse: (!) 121 95  Resp: 20   Temp: 98.6 F (37 C)   SpO2: 95%     Intake/Output Summary (Last 24 hours) at 06/28/2019 1655 Last data filed at 06/28/2019 1300 Gross per 24 hour  Intake 120 ml  Output -  Net 120 ml   Filed Weights   06/22/19 1530 06/22/19 2029 06/24/19 1500  Weight: 74.8 kg 76.2 kg 76.1 kg    ROS: Review of Systems  Constitutional: Negative for chills and fever.  Eyes: Negative for blurred vision.  Respiratory: Negative for cough and shortness of breath.   Cardiovascular: Negative for chest pain.  Gastrointestinal: Positive for abdominal pain. Negative for constipation, nausea and vomiting.  Genitourinary: Negative for dysuria.  Musculoskeletal: Negative for joint pain.  Neurological: Negative for dizziness and headaches.   Exam: Physical Exam  Constitutional: She is oriented to person, place, and time.  HENT:  Nose: No mucosal edema.  Mouth/Throat: No oropharyngeal exudate or posterior oropharyngeal edema.  Eyes: Pupils are equal, round, and reactive to light. Conjunctivae, EOM and lids are normal.  Neck: No JVD present. Carotid bruit is not present. No edema present. No thyroid mass and no thyromegaly present.  Cardiovascular: S1 normal and S2 normal. Exam reveals no gallop.  No murmur heard. Respiratory: No respiratory distress. She has decreased breath sounds in the right lower field and the left lower field. She has no wheezes. She has no rhonchi. She has no rales.  GI: Soft. Bowel sounds are normal. She exhibits distension. There is no abdominal tenderness.  Musculoskeletal:     Right ankle: She exhibits  swelling.     Left ankle: She exhibits swelling.  Lymphadenopathy:    She has no cervical adenopathy.  Neurological: She is alert and oriented to person, place, and time. No cranial nerve deficit.  Skin: Skin is warm. No rash noted. Nails show no clubbing.  Psychiatric: She has a normal mood and affect.      Data Reviewed: Basic Metabolic Panel: Recent Labs  Lab 06/24/19 0511 06/25/19 0418 06/26/19 0337 06/27/19 0626 06/28/19 0321  NA 133* 135 134* 134* 135  K 4.1 4.7 4.0 3.7 4.5  CL 102 104 103 102 102  CO2 21* 25 26 26 28   GLUCOSE 104* 98 125* 125* 109*  BUN <5* <5* 5* 5* 6  CREATININE 0.50 0.37* 0.50 0.54 0.42*  CALCIUM 8.0* 8.2* 8.0* 7.9* 8.1*  MG 1.7 1.7 1.7 1.5* 1.7  PHOS 2.0* 2.1* 3.2 2.8 2.9   Liver Function Tests: Recent Labs  Lab 06/22/19 1600 06/23/19 0522 06/24/19 0511 06/25/19 0418 06/28/19 0321  AST 45* 36 38 36 38  ALT 19 17 15 15 16   ALKPHOS 438* 385* 367* 411* 402*  BILITOT 2.1* 1.3* 1.4* 1.3* 1.2  PROT 5.5* 4.9* 4.6* 4.5* 4.5*  ALBUMIN 2.9* 2.7* 2.5* 2.4* 2.4*   CBC: Recent Labs  Lab 06/22/19 1332 06/22/19 1600 06/23/19 0522 06/24/19 0511 06/25/19 0418 06/27/19 1240 06/28/19 0321  WBC 13.3* 12.3* 10.7* 11.0*  --  10.6* 9.6  NEUTROABS 9.9* 9.5*  --  7.7  --   --   --   HGB 10.4*  10.4* 8.2* 8.9* 8.3* 8.4* 7.6*  HCT 33.0* 33.2* 26.5* 28.1*  --  26.5* 24.1*  MCV 79.5* 79.0* 79.1* 78.5*  --  77.9* 77.7*  PLT 223 222 177 180  --  188 210   BNP (last 3 results) Recent Labs    01/18/19 1220  BNP 771.0*    CBG: Recent Labs  Lab 06/23/19 0335  GLUCAP 125*    Recent Results (from the past 240 hour(s))  Blood culture (routine x 2)     Status: None   Collection Time: 06/22/19  4:20 PM   Specimen: BLOOD  Result Value Ref Range Status   Specimen Description BLOOD BLOOD RIGHT ARM  Final   Special Requests   Final    BOTTLES DRAWN AEROBIC AND ANAEROBIC Blood Culture adequate volume   Culture   Final    NO GROWTH 5 DAYS Performed  at [facility], [address], [place], [place] [address]    Report Status 06/27/2019 FINAL  Final  Blood culture (routine x 2)     Status: None   Collection Time: 06/22/19  4:20 PM   Specimen: BLOOD  Result Value Ref Range Status   Specimen Description BLOOD BLOOD LEFT HAND  Final   Special Requests   Final    BOTTLES DRAWN AEROBIC AND ANAEROBIC Blood Culture adequate volume   Culture   Final    NO GROWTH 5 DAYS Performed at [facility], [address] [place], [place] [address]    Report Status 06/27/2019 FINAL  Final  SARS Coronavirus 2 by RT PCR (hospital order, performed in [place] hospital lab) Nasopharyngeal Nasopharyngeal Swab     Status: None   Collection Time: 06/22/19  4:40 PM   Specimen: Nasopharyngeal Swab  Result Value Ref Range Status   SARS Coronavirus 2 NEGATIVE NEGATIVE Final    Comment: (NOTE) If result is NEGATIVE SARS-CoV-2 target nucleic acids are NOT DETECTED. The SARS-CoV-2 RNA is generally detectable in upper and lower  respiratory specimens during the acute phase of infection. The lowest  concentration of SARS-CoV-2 viral copies this assay can detect is 250  copies / mL. A negative result does not preclude SARS-CoV-2 infection  and should not be used as the sole basis for treatment or other  patient management decisions.  A negative result may occur with  improper specimen collection / handling, submission of specimen other  than nasopharyngeal swab, presence of viral mutation(s) within the  areas targeted by this assay, and inadequate number of viral copies  (<250 copies / mL). A negative result must be combined with clinical  observations, patient history, and epidemiological information. If result is POSITIVE SARS-CoV-2 target nucleic acids are DETECTED. The SARS-CoV-2 RNA is generally detectable in upper and lower  respiratory specimens dur ing the acute phase of infection.  Positive  results are indicative of active  infection with SARS-CoV-2.  Clinical  correlation with patient history and other diagnostic information is  necessary to determine patient infection status.  Positive results do  not rule out bacterial infection or co-infection with other viruses. If result is PRESUMPTIVE POSTIVE SARS-CoV-2 nucleic acids MAY BE PRESENT.   A presumptive positive result was obtained on the submitted specimen  and confirmed on repeat testing.  While 2019 novel coronavirus  (SARS-CoV-2) nucleic acids may be present in the submitted sample  additional confirmatory testing may be necessary for epidemiological  and / or clinical management purposes  to differentiate between  SARS-CoV-2 and other Sarbecovirus  currently known to infect humans.  If clinically indicated additional testing with an alternate test  methodology [phone]) is advised. The SARS-CoV-2 RNA is generally  detectable in upper and lower respiratory sp ecimens during the acute  phase of infection. The expected result is Negative. Fact Sheet for Patients:  StrictlyIdeas.no Fact Sheet for Healthcare Providers: BankingDealers.co.za This test is not yet approved or cleared by the [place] FDA and has been authorized for detection and/or diagnosis of SARS-CoV-2 by FDA under an Emergency Use Authorization (EUA).  This EUA will remain in effect (meaning this test can be used) for the duration of the COVID-19 declaration under Section 564(b)(1) of the Act, 21 U.S.C. section 360bbb-3(b)(1), unless the authorization is terminated or revoked sooner. Performed at [facility], [address], [place], [place] [address]   Urine Culture     Status: None   Collection Time: 06/22/19  6:09 PM   Specimen: Urine, Random  Result Value Ref Range Status   Specimen Description   Final    URINE, RANDOM Performed at [facility], [address] [place], [place] [address]    Special  Requests   Final    Normal Performed at [facility], [address] [place], [place] [address]    Culture   Final    NO GROWTH Performed at [facility], [place] [address] [place], [place] [address]    Report Status 06/24/2019 FINAL  Final  Body fluid culture ( includes gram stain)     Status: None   Collection Time: 06/22/19  6:44 PM   Specimen: Peritoneal Cavity; Peritoneal Fluid  Result Value Ref Range Status   Specimen Description   Final    PERITONEAL CAVITY Performed at [facility], [address] [place], [place] [address]    Special Requests   Final    Normal Performed at [facility], [address] [place], [place] [address]    Gram Stain   Final    WBC PRESENT,BOTH PMN AND MONONUCLEAR NO ORGANISMS SEEN CYTOSPIN SMEAR    Culture   Final    NO GROWTH 3 DAYS Performed at [facility], [place] [address] [place], [place] [address]    Report Status 06/27/2019 FINAL  Final  MRSA PCR Screening     Status: None   Collection Time: 06/23/19  4:22 AM   Specimen: Nasopharyngeal  Result Value Ref Range Status   MRSA by PCR NEGATIVE NEGATIVE Final    Comment:        The GeneXpert MRSA Assay (FDA approved for NASAL specimens only), is one component of a comprehensive MRSA colonization surveillance program. It is not intended to diagnose MRSA infection nor to guide or monitor treatment for MRSA infections. Performed at [facility], [address] [place], [place] [address]   Body fluid culture with gram stain     Status: None   Collection Time: 06/23/19  1:46 PM   Specimen: Peritoneal Washings; Body Fluid  Result Value Ref Range Status   Specimen Description   Final    PERITONEAL FLUID Performed at [facility], [address], [place], [place] [address]    Special Requests   Final    NONE Performed at [facility], [address] [place],  [address]    Gram Stain   Final    RARE  WBC PRESENT, PREDOMINANTLY MONONUCLEAR NO ORGANISMS SEEN    Culture   Final  NO GROWTH 3 DAYS Performed at [facility], [place] [address] [place], [place] [address]    Report Status 06/28/2019 FINAL  Final     Studies: US Paracentesis  Result Date: 06/27/2019 INDICATION: 58 year old with decompensated cirrhosis and ascites. EXAM: ULTRASOUND GUIDED PARACENTESIS MEDICATIONS: None. COMPLICATIONS: None immediate. PROCEDURE: Informed written consent was obtained from the patient after a discussion of the risks, benefits and alternatives to treatment. A timeout was performed prior to the initiation of the procedure. Initial ultrasound scanning demonstrates a large amount of ascites within the right lower abdominal quadrant. The right lower abdomen was prepped and draped in the usual sterile fashion. 1% lidocaine was used for local anesthesia. Following this, a 6 Fr Safe-T-Centesis catheter was introduced. An ultrasound image was saved for documentation purposes. The paracentesis was performed. The catheter was removed and a dressing was applied. The patient tolerated the procedure well without immediate post procedural complication. FINDINGS: A total of approximately 3.9 L of yellow fluid was removed. IMPRESSION: Successful ultrasound-guided paracentesis yielding 3.9 liters of peritoneal fluid. Electronically Signed   By: Markus Daft M.D.   On: 06/27/2019 07:52    Scheduled Meds: . Chlorhexidine Gluconate Cloth  6 each Topical Daily  . digoxin  0.125 mg Oral Daily  . feeding supplement (ENSURE ENLIVE)  237 mL Oral BID BM  . FLUoxetine  10 mg Oral Daily  . furosemide  40 mg Oral Daily  . gabapentin  100 mg Oral BID  . lactobacillus  1 g Oral TID WC  . metoprolol succinate  25 mg Oral Daily  . midodrine  5 mg Oral TID WC  . multivitamin with minerals  1 tablet Oral Daily  . pantoprazole  40 mg Oral BID  . spironolactone  50 mg Oral Daily  . sulfamethoxazole-trimethoprim  1 tablet Oral  Daily  . tiZANidine  2 mg Oral QHS   Continuous Infusions: . [START ON 06/29/2019] iron sucrose      Assessment/Plan:  1. Clinical sepsis, present on admission.  SBP ruled out. Off  antibiotics.  2. Cirrhosis of the liver with ascites that is recurrent.  Mortality is very high within 2 years of developing ascites. Blood pressure is the limiting factor at this point. Gastroenterology recommended Bactrim DS daily to prevent SBP.  Patient has completed vancomycin course for total of 14 days from the start date.  Patient is not a candidate for liver transplantation as her meld score is at 4.  GI is recommending to continue low-dose Lasix and Aldactone 50 mg once daily.   3. hypotension.  Patient states that her blood pressure has been low for a while. Midodrine to help with BP 4. Atrial fibrillation with rapid ventricular response.- HR seems some better controlled on Digoxin & Toprol 25 mg. Not a candidate for anticoagulation currently. Will await cardio input to decide oupt regimen for D/C 5. Hypokalemia and hypomagnesemia.  Repleted 6. Hepatitis C.  As per husband this was treated. 7. Peritoneal fluid leakage from the paracentesis site.-As patient is at high risk for spontaneous bacterial peritonitis, surgery has seen the patient and closed the  leakage of the peritoneal fluid.  Surgery signed off 8. Disposition Home health ask home health PT.  Outpatient palliative care is recommended by palliative care specialist  Code Status:     Code Status Orders  (From admission, onward)         Start     Ordered   06/22/19 2023  Do not attempt resuscitation (  DNR)  Continuous    Question Answer Comment  In the event of cardiac or respiratory ARREST Do not call a "code blue"   In the event of cardiac or respiratory ARREST Do not perform Intubation, CPR, defibrillation or ACLS   In the event of cardiac or respiratory ARREST Use medication by any route, position, wound care, and other measures to  relive pain and suffering. May use oxygen, suction and manual treatment of airway obstruction as needed for comfort.   Comments RN may pronounce      06/22/19 2023        Code Status History    Date Active Date Inactive Code Status Order ID Comments User Context   06/01/2019 2148 06/11/2019 2150 Full Code [id number]  Bettey Costa, MD Inpatient   01/28/2019 1920 01/31/2019 1827 Full Code [id number]  Otila Back, MD ED   01/18/2019 1651 01/19/2019 1946 DNR [id number]  Otila Back, MD ED   Advance Care Planning Activity     Family Communication:  Disposition Plan: over next 1-2 days depending on clinical condition, HR control and cardio eval  Consultants:  Cardiology-[facility]  Gastroenterology  Antibiotics:  Rocephin  Flagyl  Time spent: 35  minutes, case discussed with Cardiology  [facility]

## 2019-06-28 NOTE — Progress Notes (Signed)
Progress Note  Patient Name: Anita Reed Date of Encounter: 06/28/2019  Primary Cardiologist: Iverson Alamin  Subjective   Reports that she feels well other than chronic pain Would like to go home Telemetry reviewed rate 80-100 past 24 hours  Inpatient Medications    Scheduled Meds: . Chlorhexidine Gluconate Cloth  6 each Topical Daily  . digoxin  0.125 mg Oral Daily  . feeding supplement (ENSURE ENLIVE)  237 mL Oral BID BM  . FLUoxetine  10 mg Oral Daily  . furosemide  40 mg Oral Daily  . gabapentin  100 mg Oral BID  . lactobacillus  1 g Oral TID WC  . metoprolol succinate  25 mg Oral Daily  . midodrine  5 mg Oral TID WC  . multivitamin with minerals  1 tablet Oral Daily  . pantoprazole  40 mg Oral BID  . rifaximin  550 mg Oral BID  . spironolactone  50 mg Oral Daily  . sulfamethoxazole-trimethoprim  1 tablet Oral Daily  . tiZANidine  2 mg Oral QHS   Continuous Infusions: . [START ON 06/29/2019] iron sucrose     PRN Meds: baclofen, HYDROmorphone (DILAUDID) injection, ondansetron **OR** ondansetron (ZOFRAN) IV, oxyCODONE   Vital Signs    Vitals:   06/27/19 1641 06/27/19 2014 06/28/19 0536 06/28/19 0954  BP:  (!) 99/52 96/66 (!) 98/55  Pulse: 98 (!) 123 (!) 121 95  Resp:  20 20   Temp:  99.3 F (37.4 C) 98.6 F (37 C)   TempSrc:  Oral Oral   SpO2:  98% 95%   Weight:      Height:        Intake/Output Summary (Last 24 hours) at 06/28/2019 1719 Last data filed at 06/28/2019 1300 Gross per 24 hour  Intake 120 ml  Output -  Net 120 ml   Last 3 Weights 06/24/2019 06/22/2019 06/22/2019  Weight (lbs) 167 lb 12.3 oz 168 lb 165 lb  Weight (kg) 76.1 kg 76.204 kg 74.844 kg      Telemetry    Atrial flutter with variable conduction, rates 80 to 100s - Personally Reviewed  ECG    No new tracings- Personally Reviewed  Physical Exam   Constitutional:  oriented to person, place, and time. No distress.  HENT:  Head: Grossly normal Eyes:  no discharge. No  scleral icterus.  Neck: No JVD, no carotid bruits  Cardiovascular: Regular, tachycardic no murmurs appreciated Pulmonary/Chest: Clear to auscultation bilaterally, no wheezes or rails Abdominal: Soft.  no distension.  no tenderness.  Musculoskeletal: Normal range of motion Neurological:  normal muscle tone. Coordination normal. No atrophy Skin: Skin warm and dry Psychiatric: normal affect, pleasant   Labs    High Sensitivity Troponin:   Recent Labs  Lab 06/01/19 1251 06/01/19 1926  TROPONINIHS 12 15      Cardiac EnzymesNo results for input(s): TROPONINI in the last 168 hours. No results for input(s): TROPIPOC in the last 168 hours.   Chemistry Recent Labs  Lab 06/24/19 0511 06/25/19 0418 06/26/19 0337 06/27/19 0626 06/28/19 0321  NA 133* 135 134* 134* 135  K 4.1 4.7 4.0 3.7 4.5  CL 102 104 103 102 102  CO2 21* 25 26 26 28   GLUCOSE 104* 98 125* 125* 109*  BUN <5* <5* 5* 5* 6  CREATININE 0.50 0.37* 0.50 0.54 0.42*  CALCIUM 8.0* 8.2* 8.0* 7.9* 8.1*  PROT 4.6* 4.5*  --   --  4.5*  ALBUMIN 2.5* 2.4*  --   --  2.4*  AST 38 36  --   --  38  ALT 15 15  --   --  16  ALKPHOS 367* 411*  --   --  402*  BILITOT 1.4* 1.3*  --   --  1.2  GFRNONAA >60 >60 >60 >60 >60  GFRAA >60 >60 >60 >60 >60  ANIONGAP 10 6 5 6 5      Hematology Recent Labs  Lab 06/24/19 0511 06/25/19 0418 06/27/19 1240 06/28/19 0321  WBC 11.0*  --  10.6* 9.6  RBC 3.58*  --  3.40* 3.10*  HGB 8.9* 8.3* 8.4* 7.6*  HCT 28.1*  --  26.5* 24.1*  MCV 78.5*  --  77.9* 77.7*  MCH 24.9*  --  24.7* 24.5*  MCHC 31.7  --  31.7 31.5  RDW 19.3*  --  18.6* 18.8*  PLT 180  --  188 210    BNPNo results for input(s): BNP, PROBNP in the last 168 hours.   DDimer No results for input(s): DDIMER in the last 168 hours.   Radiology    No results found.  Cardiac Studies  Echo 01/19/19 1. The left ventricle has normal systolic function, with an ejection fraction of 55-60%. The cavity size was normal. Left  ventricular diastolic function could not be evaluated. No evidence of left ventricular regional wall motion abnormalities. 2. The right ventricle has normal systolic function. The cavity was not assessed. There is no increase in right ventricular wall thickness. Right ventricular systolic pressure is mildly elevated with an estimated pressure of 35.0 mmHg. 3. Left atrial size was mildly dilated. 4. The mitral valve is degenerative. Mild thickening of the mitral valve leaflet. There is moderate mitral annular calcification present. No evidence of mitral valve stenosis. 5. The tricuspid valve is grossly normal. Tricuspid valve regurgitation is mild-moderate. 6. The aortic valve is tricuspid. Moderate thickening of the aortic valve. Mild calcification of the aortic valve. 7. The aortic root is normal in size and structure. 8. The inferior vena cava was dilated in size with <50% respiratory variability. 9. Underlying rhythm appears to be atrial flutter.   Patient Profile     58 y.o. female with hx of C. difficile colitis, portal hypertension, portal hypertensive gastropathy, spontaneous bacterial peritonitis, hepatic encephalopathy, decompensated alcoholic cirrhosis, esophageal varices, ascites,current smoker,no known past medical history of heart failure or arrhythmia,and newly diagnosed atrial flutterwho is being seen today for the evaluation of new onset atrial flutter in the setting of hypokalemia.  Assessment & Plan    Paroxysmal atrial flutterwith RVR --Relatively asymptomaticin atrial flutter.  Finally achieved better rate control on metoprolol and digoxin Rate today 80-100 for most of the past 24 hours Would continue midodrine for blood pressure support  Hypomagnesemia, hypokalemia Potassium at goal 4.5  History of GI bleed History of Esophageal varices Known Liver cirrhosis  --Last Hgb 7.6.  --S/p paracentesis.  She reports that she is scheduled to have weekly  paracentesis with GI --Per GI note, she does not yet meet criteria for liver transplant.  Recent Cdiff --Continue antibiotics Reports symptoms dramatically improved  Discharge instructions discussed with her Rate relatively well controlled, reviewed prices of all of her medications with her through [email or web address] She does not have any pharmacy insurance She is insistent on going home Instructed the need to closely monitor her blood count  Total encounter time more than 35 minutes  Greater than 50% was spent in counseling and coordination of care with the patient    For questions  or updates, please contact [place] Please consult [email or web address] for contact info under        Signed, Ida Rogue, MD  06/28/2019, 5:19 PM

## 2019-06-29 ENCOUNTER — Telehealth: Payer: Self-pay | Admitting: Family Medicine

## 2019-06-29 ENCOUNTER — Other Ambulatory Visit: Payer: Self-pay | Admitting: Gastroenterology

## 2019-06-29 DIAGNOSIS — M62838 Other muscle spasm: Secondary | ICD-10-CM

## 2019-06-29 MED ORDER — TIZANIDINE HCL 4 MG PO CAPS: 4.0000 mg | ORAL_CAPSULE | Freq: Every day | ORAL | 0 refills | Status: DC

## 2019-06-29 NOTE — Telephone Encounter (Signed)
Home Health Verbal Orders - Caller/Agency: [facility] Number: [phone] option 2 Requesting OT/PT/Skilled Nursing/Social Work/Speech Therapy: Requesting palliative care  Frequency: TBD

## 2019-06-29 NOTE — Progress Notes (Signed)
Anita Reed  Let her know that I sent in zanaflex 4mg  at bedtime daily for 30days only  RV

## 2019-06-30 NOTE — Discharge Summary (Signed)
[facility] [place] NAME: Anita Reed    MR#:  [id number]  DATE OF BIRTH:  [birth year]  DATE OF ADMISSION:  06/22/2019   ADMITTING PHYSICIAN: Nicholes Mango, MD  DATE OF DISCHARGE: 06/28/2019  6:58 PM  PRIMARY CARE PHYSICIAN: Hubbard Hartshorn, FNP   ADMISSION DIAGNOSIS:  Hypokalemia [E87.6] Hypomagnesemia [E83.42] Acute abdominal pain [R10.9] Sepsis, due to unspecified organism, unspecified whether acute organ dysfunction present ([place]) [A41.9] Sepsis ([place]) [A41.9] DISCHARGE DIAGNOSIS:  Active Problems:   Atrial flutter ([place])   Sepsis ([place])   Goals of care, counseling/discussion   Palliative care encounter  SECONDARY DIAGNOSIS:   Past Medical History:  Diagnosis Date  . Anemia   . Cancer ([place])   . Cirrhosis ([place])   . Hypertension    HOSPITAL COURSE:  1. Clinical sepsis, ruled out  2. Cirrhosis of the liver with ascites that is recurrent. Patient is not a candidate for liver transplantation as her meld score is at 70.  GI is recommending to continue low-dose Lasix and Aldactone 50 mg once daily.   3. Hypotension.  Patient states that her blood pressure has been low for a while. Midodrine to help with BP 4. Atrial fibrillation with rapid ventricular response.- HR seems some better controlled on Digoxin & Toprol 25 mg. Not a candidate for anticoagulation currently.  5. Hypokalemia and hypomagnesemia.  Repleted 6. Hepatitis C.  As per husband this was treated. 7. Peritoneal fluid leakage from the paracentesis site.-As patient is at high risk for spontaneous bacterial peritonitis, surgery has seen the patient and closed the  leakage of the peritoneal fluid.  Surgery signed off 8. Disposition Home health ask home health PT.  Outpatient palliative care is recommended by palliative care specialist  DISCHARGE CONDITIONS:  stable CONSULTS OBTAINED:  Treatment Team:  Minna Merritts, MD DRUG ALLERGIES:  No Known Allergies DISCHARGE  MEDICATIONS:   Allergies as of 06/28/2019   No Known Allergies     Medication List    STOP taking these medications   traMADol 50 MG tablet Commonly known as: ULTRAM     TAKE these medications   baclofen 10 MG tablet Commonly known as: LIORESAL Take 1 tablet (10 mg total) by mouth 3 (three) times daily. Muscle Relaxer   digoxin 0.125 MG tablet Commonly known as: LANOXIN Take 1 tablet (0.125 mg total) by mouth daily.   FLUoxetine 10 MG capsule Commonly known as: PROZAC Take 1 capsule (10 mg total) by mouth daily.   furosemide 40 MG tablet Commonly known as: LASIX Take 0.5 tablets (20 mg total) by mouth daily. What changed:   how much to take  when to take this   gabapentin 100 MG capsule Commonly known as: NEURONTIN Take 1 capsule (100 mg total) by mouth 2 (two) times daily.   metoprolol succinate 25 MG 24 hr tablet Commonly known as: TOPROL-XL Take 1 tablet (25 mg total) by mouth daily.   midodrine 5 MG tablet Commonly known as: PROAMATINE Take 1 tablet (5 mg total) by mouth 3 (three) times daily with meals.   pantoprazole 40 MG tablet Commonly known as: Protonix Take 1 tablet (40 mg total) by mouth 2 (two) times daily.   spironolactone 50 MG tablet Commonly known as: ALDACTONE Take 1 tablet (50 mg total) by mouth daily.      DISCHARGE INSTRUCTIONS:   DIET:  Regular diet DISCHARGE CONDITION:  Good ACTIVITY:  Activity as tolerated OXYGEN:  Home Oxygen: No.  Oxygen Delivery: room air DISCHARGE LOCATION:  Home with HHPT. Palliative care to follow   If you experience worsening of your admission symptoms, develop shortness of breath, life threatening emergency, suicidal or homicidal thoughts you must seek medical attention immediately by calling 911 or calling your MD immediately  if symptoms less severe.  You Must read complete instructions/literature along with all the possible adverse reactions/side effects for all the Medicines you take and that  have been prescribed to you. Take any new Medicines after you have completely understood and accpet all the possible adverse reactions/side effects.   Please note  You were cared for by a hospitalist during your hospital stay. If you have any questions about your discharge medications or the care you received while you were in the hospital after you are discharged, you can call the unit and asked to speak with the hospitalist on call if the hospitalist that took care of you is not available. Once you are discharged, your primary care physician will handle any further medical issues. Please note that NO REFILLS for any discharge medications will be authorized once you are discharged, as it is imperative that you return to your primary care physician (or establish a relationship with a primary care physician if you do not have one) for your aftercare needs so that they can reassess your need for medications and monitor your lab values.    On the day of Discharge:  VITAL SIGNS:  Blood pressure 107/70, pulse (!) 120, temperature 98.7 F (37.1 C), resp. rate 18, height 5' 8.5" (1.74 m), weight 76.1 kg, SpO2 95 %. PHYSICAL EXAMINATION:  GENERAL:  58 y.o.-year-old patient lying in the bed with no acute distress.  EYES: Pupils equal, round, reactive to light and accommodation. No scleral icterus. Extraocular muscles intact.  HEENT: Head atraumatic, normocephalic. Oropharynx and nasopharynx clear.  NECK:  Supple, no jugular venous distention. No thyroid enlargement, no tenderness.  LUNGS: Normal breath sounds bilaterally, no wheezing, rales,rhonchi or crepitation. No use of accessory muscles of respiration.  CARDIOVASCULAR: S1, S2 normal. No murmurs, rubs, or gallops.  ABDOMEN: Soft, non-tender, non-distended. Bowel sounds present. No organomegaly or mass.  EXTREMITIES: No pedal edema, cyanosis, or clubbing.  NEUROLOGIC: Cranial nerves II through XII are intact. Muscle strength 5/5 in all extremities.  Sensation intact. Gait not checked.  PSYCHIATRIC: The patient is alert and oriented x 3.  SKIN: No obvious rash, lesion, or ulcer.  DATA REVIEW:   CBC Recent Labs  Lab 06/28/19 0321  WBC 9.6  HGB 7.6*  HCT 24.1*  PLT 210    Chemistries  Recent Labs  Lab 06/28/19 0321  NA 135  K 4.5  CL 102  CO2 28  GLUCOSE 109*  BUN 6  CREATININE 0.42*  CALCIUM 8.1*  MG 1.7  AST 38  ALT 16  ALKPHOS 402*  BILITOT 1.2    Follow-up Information    Hubbard Hartshorn, FNP. Schedule an appointment as soon as possible for a visit in 1 week(s).   Specialty: Family Medicine Contact information: [address] [place] [address] [phone]        [name], MD. Schedule an appointment as soon as possible for a visit in 2 week(s).   Specialty: Gastroenterology Contact information: [place] [address] [phone]        Minna Merritts, MD. Schedule an appointment as soon as possible for a visit in 2 week(s).   Specialty: Cardiology Contact information:  [address] [place] [address] [phone]           Management plans discussed with the patient, family and they are in agreement.  CODE STATUS: Prior   TOTAL TIME TAKING CARE OF THIS PATIENT: 45 minutes.    Max Sane M.D on 06/30/2019 at 2:46 PM  Between 7am to 6pm - Pager - [phone]  After 6pm go to [email or web address] - Proofreader  [facility]  Office  [phone]  CC: Primary care physician; Hubbard Hartshorn, FNP   Note: This dictation was prepared with Dragon dictation along with smaller phrase technology. Any transcriptional errors that result from this process are unintentional.

## 2019-07-02 ENCOUNTER — Telehealth: Payer: Self-pay | Admitting: Family Medicine

## 2019-07-02 NOTE — Telephone Encounter (Signed)
Please advise 

## 2019-07-02 NOTE — Telephone Encounter (Signed)
Home Health Verbal Orders - Caller/Agency: Ria Comment with [facility] Number: [phone], OK to leave message Requesting OT/PT/Skilled Nursing/Social Work/Speech Therapy: skilled nursing Frequency: 3 week 1, 1 week 5, 1 every other week 3, and 3 as needed

## 2019-07-02 NOTE — Telephone Encounter (Signed)
Please approve

## 2019-07-02 NOTE — Telephone Encounter (Signed)
Left message on vm for [facility]

## 2019-07-03 ENCOUNTER — Telehealth: Payer: Self-pay | Admitting: Nurse Practitioner

## 2019-07-03 NOTE — Telephone Encounter (Signed)
Called this number twice, but cannot leave a message'

## 2019-07-03 NOTE — Telephone Encounter (Signed)
Spoke with patient regarding Palliative services and she was in agreement with this.  I have scheduled a Telephone Consult for 07/12/19 @ 1 PM.

## 2019-07-03 NOTE — Telephone Encounter (Signed)
Left message for Ria Comment on her voice mail.

## 2019-07-03 NOTE — Telephone Encounter (Signed)
[phone] Mardene Celeste from [facility] requesting call back, please advise. She is scheduled to do skilled nursing for pt tomorrow and needs the orders

## 2019-07-04 ENCOUNTER — Telehealth: Payer: Self-pay | Admitting: Family Medicine

## 2019-07-04 NOTE — Telephone Encounter (Signed)
Home Health Verbal Orders - Caller/Agency: [facility] Callback Number: [phone] Requesting OT/PT/Skilled Nursing/Social Work/Speech Therapy: PT Frequency: 1w 1  2w 3  1w 2     Pt also had a fall yesterday in the home with no injury.

## 2019-07-04 NOTE — Telephone Encounter (Signed)
Anita Reed calling back to check status. She is scheduled to go at 10:30 am

## 2019-07-04 NOTE — Telephone Encounter (Signed)
Anita Reed given verbal for skill nursing per [facility]

## 2019-07-05 ENCOUNTER — Telehealth: Payer: Self-pay | Admitting: Family Medicine

## 2019-07-05 LAB — FUNGAL ORGANISM REFLEX

## 2019-07-05 LAB — FUNGUS CULTURE RESULT

## 2019-07-05 LAB — FUNGUS CULTURE WITH STAIN

## 2019-07-05 NOTE — Telephone Encounter (Signed)
Mardene Celeste calling from [facility] called and stated that pt pain level is an 8 in stomach and legs. Pt would like an increase in medication. Please advise

## 2019-07-05 NOTE — Telephone Encounter (Signed)
FYA, patient had fall

## 2019-07-05 NOTE — Telephone Encounter (Signed)
Gave verbal to [place] on 07/03/2019 and left message today for Jana Half

## 2019-07-09 ENCOUNTER — Other Ambulatory Visit: Payer: Self-pay

## 2019-07-09 DIAGNOSIS — K7290 Hepatic failure, unspecified without coma: Secondary | ICD-10-CM

## 2019-07-09 DIAGNOSIS — K7460 Unspecified cirrhosis of liver: Secondary | ICD-10-CM

## 2019-07-09 MED ORDER — ALBUMIN HUMAN 25 % IV SOLN: 25.0000 g | Freq: Once | INTRAVENOUS | 0 refills | Status: AC

## 2019-07-11 ENCOUNTER — Ambulatory Visit: Payer: Medicare Other

## 2019-07-12 ENCOUNTER — Ambulatory Visit
Admission: RE | Admit: 2019-07-12 | Discharge: 2019-07-12 | Disposition: A | Discharge status: Home / Self Care | Payer: Medicare Other | Source: Ambulatory Visit | Attending: Gastroenterology | Admitting: Gastroenterology

## 2019-07-12 ENCOUNTER — Other Ambulatory Visit: Payer: Medicare Other | Admitting: Nurse Practitioner

## 2019-07-12 ENCOUNTER — Other Ambulatory Visit: Payer: Self-pay

## 2019-07-12 ENCOUNTER — Telehealth: Payer: Self-pay | Admitting: Nurse Practitioner

## 2019-07-12 DIAGNOSIS — K7460 Unspecified cirrhosis of liver: Secondary | ICD-10-CM | POA: Diagnosis not present

## 2019-07-12 DIAGNOSIS — R188 Other ascites: Secondary | ICD-10-CM | POA: Diagnosis not present

## 2019-07-12 DIAGNOSIS — K7290 Hepatic failure, unspecified without coma: Secondary | ICD-10-CM | POA: Insufficient documentation

## 2019-07-12 DIAGNOSIS — K766 Portal hypertension: Secondary | ICD-10-CM | POA: Diagnosis not present

## 2019-07-12 DIAGNOSIS — A0472 Enterocolitis due to Clostridium difficile, not specified as recurrent: Secondary | ICD-10-CM | POA: Diagnosis not present

## 2019-07-12 DIAGNOSIS — Z66 Do not resuscitate: Secondary | ICD-10-CM | POA: Diagnosis not present

## 2019-07-12 DIAGNOSIS — Z20828 Contact with and (suspected) exposure to other viral communicable diseases: Secondary | ICD-10-CM | POA: Diagnosis not present

## 2019-07-12 DIAGNOSIS — I8510 Secondary esophageal varices without bleeding: Secondary | ICD-10-CM | POA: Diagnosis not present

## 2019-07-12 DIAGNOSIS — D62 Acute posthemorrhagic anemia: Secondary | ICD-10-CM | POA: Diagnosis not present

## 2019-07-12 DIAGNOSIS — K625 Hemorrhage of anus and rectum: Secondary | ICD-10-CM | POA: Diagnosis not present

## 2019-07-12 IMAGING — US US PARACENTESIS
1 series · 5 of 5 positions shown · non-contrast
Comparison: none

INDICATION: 57-year-old with decompensated hepatic cirrhosis. Recurrent ascites.

[Series 1: us paracentesis · 0.25mm/px · 5 of 5 slices shown]
[im 1/5]
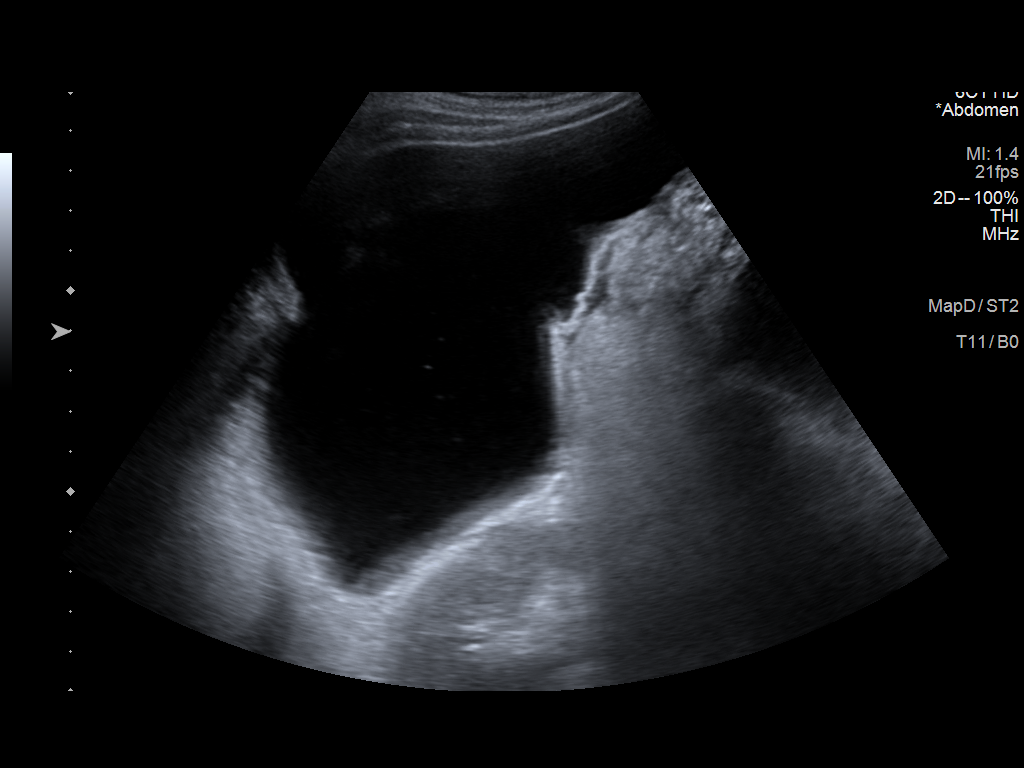
[im 2/5]
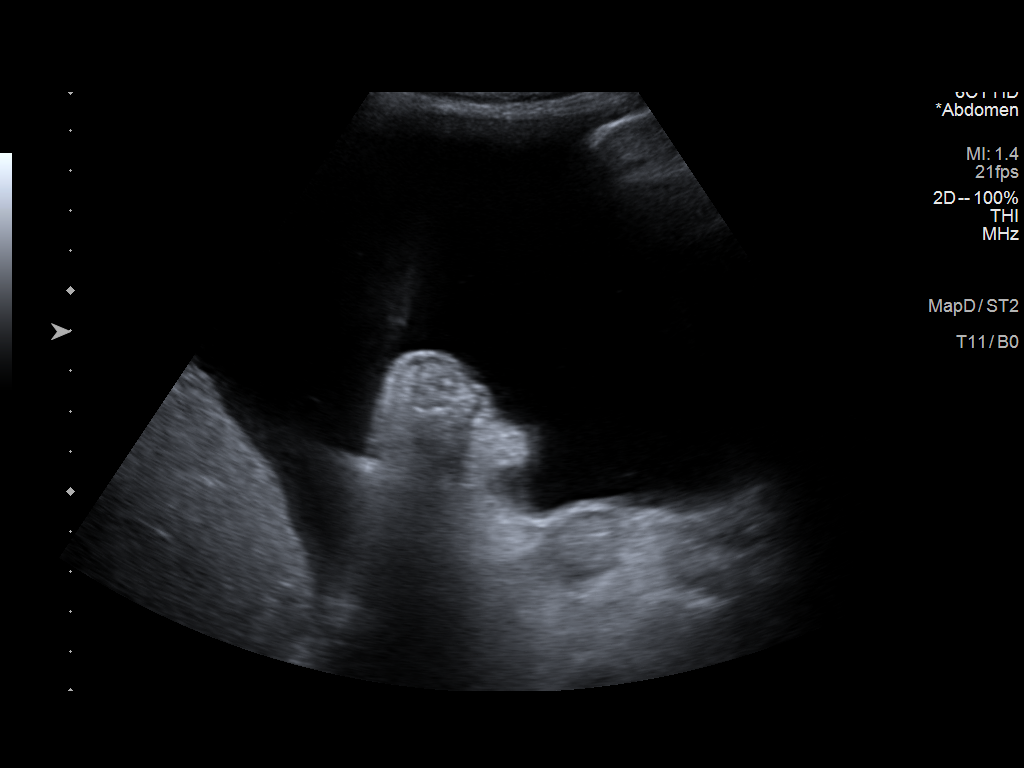
[im 3/5]
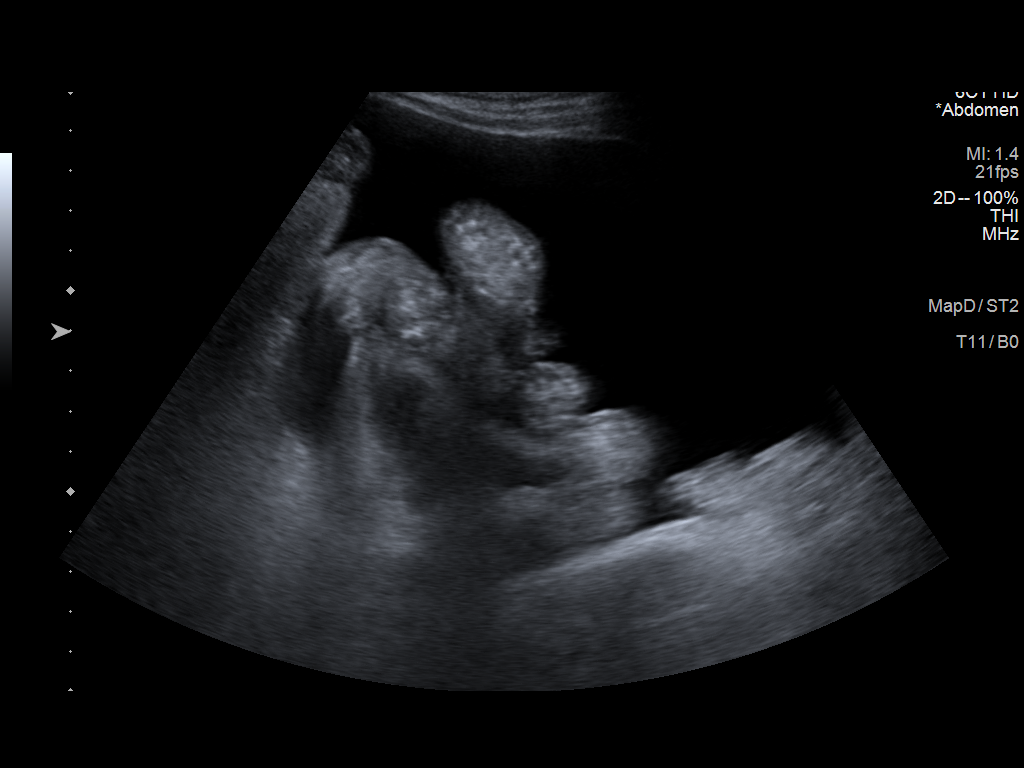
[im 4/5]
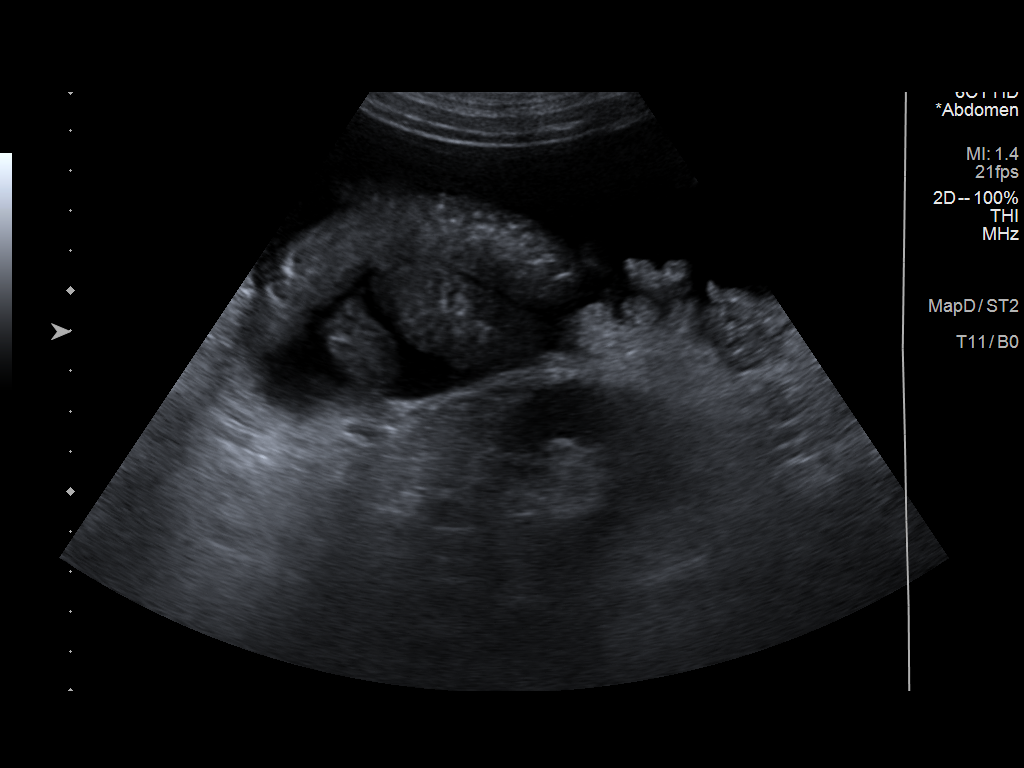
[im 5/5]
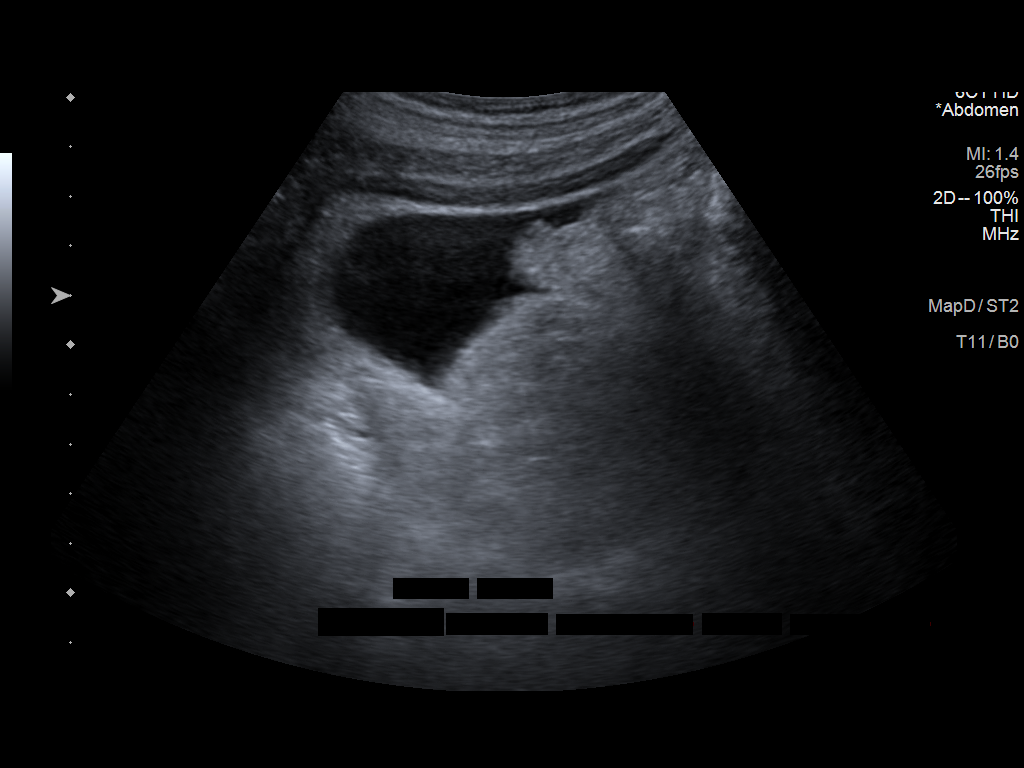

[5 of 5 positions shown; findings below may reference images not displayed]

EXAM:
ULTRASOUND GUIDED PARACENTESIS

MEDICATIONS:
None.

COMPLICATIONS:
None immediate.

PROCEDURE:
Informed written consent was obtained from the patient after a
discussion of the risks, benefits and alternatives to treatment. A
timeout was performed prior to the initiation of the procedure.

Initial ultrasound scanning demonstrates a large amount of ascites
within the right lower abdominal quadrant. The right lower abdomen
was prepped and draped in the usual sterile fashion. 1% lidocaine
was used for local anesthesia.

Following this, a 6 Fr Safe-T-Centesis catheter was introduced. An
ultrasound image was saved for documentation purposes. The
paracentesis was performed. The catheter was removed and a dressing
was applied. The patient tolerated the procedure well without
immediate post procedural complication.
Patient received post-procedure intravenous albumin; see nursing
notes for details.
FINDINGS: A total of approximately 5.7 L of amber fluid was removed.
IMPRESSION: Successful ultrasound-guided paracentesis yielding 5.7 liters of
peritoneal fluid.

## 2019-07-12 MED ORDER — ALBUMIN HUMAN 25 % IV SOLN
25.0000 g | Freq: Once | INTRAVENOUS | Status: AC
  Administered 2019-07-12: 16:00:00 25 g via INTRAVENOUS

## 2019-07-12 MED ORDER — ALBUMIN HUMAN 25 % IV SOLN
INTRAVENOUS | Status: AC
  Administered 2019-07-12: 16:00:00 25 g via INTRAVENOUS
  Filled 2019-07-12: qty 100

## 2019-07-12 NOTE — Procedures (Signed)
Interventional Radiology Procedure:   Indications: Cirrhosis and ascites.  Procedure: US guided paracentesis  Findings: Removed 5700 ml from RLQ quadrant  Complications: None     EBL: Less than 10 ml   Andera Cranmer R. Anselm Pancoast, MD  Pager: [phone]

## 2019-07-12 NOTE — Telephone Encounter (Signed)
I called for scheduled telemedicine, telephonic initial palliative care visit, Anita Reed endorses prefer to reschedule, she is currently getting ready to leave to go have a paracentesis at 2pm. Rescheduled for Nov 17, 22020 at 2pm.

## 2019-07-14 ENCOUNTER — Other Ambulatory Visit: Payer: Self-pay

## 2019-07-14 ENCOUNTER — Emergency Department: Payer: Medicare Other

## 2019-07-14 ENCOUNTER — Encounter: Payer: Self-pay | Admitting: Emergency Medicine

## 2019-07-14 ENCOUNTER — Inpatient Hospital Stay
Admission: EM | Admit: 2019-07-14 | Discharge: 2019-07-18 | DRG: 372 | Disposition: A | Discharge status: Home Health | Payer: Medicare Other | Attending: Family Medicine | Admitting: Family Medicine

## 2019-07-14 DIAGNOSIS — K766 Portal hypertension: Secondary | ICD-10-CM | POA: Diagnosis present

## 2019-07-14 DIAGNOSIS — I700 Atherosclerosis of aorta: Secondary | ICD-10-CM | POA: Diagnosis present

## 2019-07-14 DIAGNOSIS — Z9089 Acquired absence of other organs: Secondary | ICD-10-CM

## 2019-07-14 DIAGNOSIS — K652 Spontaneous bacterial peritonitis: Secondary | ICD-10-CM

## 2019-07-14 DIAGNOSIS — A0472 Enterocolitis due to Clostridium difficile, not specified as recurrent: Principal | ICD-10-CM

## 2019-07-14 DIAGNOSIS — F1011 Alcohol abuse, in remission: Secondary | ICD-10-CM | POA: Diagnosis present

## 2019-07-14 DIAGNOSIS — M62838 Other muscle spasm: Secondary | ICD-10-CM

## 2019-07-14 DIAGNOSIS — R161 Splenomegaly, not elsewhere classified: Secondary | ICD-10-CM | POA: Diagnosis present

## 2019-07-14 DIAGNOSIS — I4819 Other persistent atrial fibrillation: Secondary | ICD-10-CM | POA: Diagnosis present

## 2019-07-14 DIAGNOSIS — F329 Major depressive disorder, single episode, unspecified: Secondary | ICD-10-CM | POA: Diagnosis present

## 2019-07-14 DIAGNOSIS — K7031 Alcoholic cirrhosis of liver with ascites: Secondary | ICD-10-CM | POA: Diagnosis present

## 2019-07-14 DIAGNOSIS — K922 Gastrointestinal hemorrhage, unspecified: Secondary | ICD-10-CM | POA: Diagnosis present

## 2019-07-14 DIAGNOSIS — I959 Hypotension, unspecified: Secondary | ICD-10-CM | POA: Diagnosis not present

## 2019-07-14 DIAGNOSIS — I8510 Secondary esophageal varices without bleeding: Secondary | ICD-10-CM | POA: Diagnosis present

## 2019-07-14 DIAGNOSIS — G8929 Other chronic pain: Secondary | ICD-10-CM | POA: Diagnosis present

## 2019-07-14 DIAGNOSIS — R188 Other ascites: Secondary | ICD-10-CM | POA: Diagnosis not present

## 2019-07-14 DIAGNOSIS — I480 Paroxysmal atrial fibrillation: Secondary | ICD-10-CM | POA: Diagnosis present

## 2019-07-14 DIAGNOSIS — Z8572 Personal history of non-Hodgkin lymphomas: Secondary | ICD-10-CM

## 2019-07-14 DIAGNOSIS — D62 Acute posthemorrhagic anemia: Secondary | ICD-10-CM | POA: Diagnosis not present

## 2019-07-14 DIAGNOSIS — G2581 Restless legs syndrome: Secondary | ICD-10-CM | POA: Diagnosis present

## 2019-07-14 DIAGNOSIS — R160 Hepatomegaly, not elsewhere classified: Secondary | ICD-10-CM | POA: Diagnosis not present

## 2019-07-14 DIAGNOSIS — I9589 Other hypotension: Secondary | ICD-10-CM | POA: Diagnosis present

## 2019-07-14 DIAGNOSIS — K7460 Unspecified cirrhosis of liver: Secondary | ICD-10-CM | POA: Diagnosis not present

## 2019-07-14 DIAGNOSIS — Z66 Do not resuscitate: Secondary | ICD-10-CM | POA: Diagnosis present

## 2019-07-14 DIAGNOSIS — I10 Essential (primary) hypertension: Secondary | ICD-10-CM | POA: Diagnosis present

## 2019-07-14 DIAGNOSIS — Z20828 Contact with and (suspected) exposure to other viral communicable diseases: Secondary | ICD-10-CM | POA: Diagnosis present

## 2019-07-14 DIAGNOSIS — D72825 Bandemia: Secondary | ICD-10-CM | POA: Diagnosis not present

## 2019-07-14 DIAGNOSIS — F17200 Nicotine dependence, unspecified, uncomplicated: Secondary | ICD-10-CM | POA: Diagnosis present

## 2019-07-14 DIAGNOSIS — E876 Hypokalemia: Secondary | ICD-10-CM | POA: Diagnosis present

## 2019-07-14 DIAGNOSIS — Z8619 Personal history of other infectious and parasitic diseases: Secondary | ICD-10-CM

## 2019-07-14 DIAGNOSIS — Z90710 Acquired absence of both cervix and uterus: Secondary | ICD-10-CM

## 2019-07-14 DIAGNOSIS — F419 Anxiety disorder, unspecified: Secondary | ICD-10-CM | POA: Diagnosis present

## 2019-07-14 DIAGNOSIS — G629 Polyneuropathy, unspecified: Secondary | ICD-10-CM | POA: Diagnosis present

## 2019-07-14 DIAGNOSIS — K3189 Other diseases of stomach and duodenum: Secondary | ICD-10-CM | POA: Diagnosis present

## 2019-07-14 DIAGNOSIS — K625 Hemorrhage of anus and rectum: Secondary | ICD-10-CM | POA: Diagnosis not present

## 2019-07-14 DIAGNOSIS — Z79899 Other long term (current) drug therapy: Secondary | ICD-10-CM

## 2019-07-14 DIAGNOSIS — Z9049 Acquired absence of other specified parts of digestive tract: Secondary | ICD-10-CM

## 2019-07-14 DIAGNOSIS — I4892 Unspecified atrial flutter: Secondary | ICD-10-CM | POA: Diagnosis present

## 2019-07-14 DIAGNOSIS — Z8719 Personal history of other diseases of the digestive system: Secondary | ICD-10-CM

## 2019-07-14 LAB — COMPREHENSIVE METABOLIC PANEL
ALT: 20 U/L (ref 0–44)
AST: 57 U/L — ABNORMAL HIGH (ref 15–41)
Albumin: 3.3 g/dL — ABNORMAL LOW (ref 3.5–5.0)
Alkaline Phosphatase: 361 U/L — ABNORMAL HIGH (ref 38–126)
Anion gap: 14 (ref 5–15)
BUN: 8 mg/dL (ref 6–20)
CO2: 22 mmol/L (ref 22–32)
Calcium: 7.8 mg/dL — ABNORMAL LOW (ref 8.9–10.3)
Chloride: 102 mmol/L (ref 98–111)
Creatinine, Ser: 0.58 mg/dL (ref 0.44–1.00)
GFR calc Af Amer: >60 mL/min (ref >60)
GFR calc non Af Amer: >60 mL/min (ref >60)
Glucose, Bld: 96 mg/dL (ref 70–99)
Potassium: 2.6 mmol/L — CL (ref 3.5–5.1)
Sodium: 138 mmol/L (ref 135–145)
Total Bilirubin: 2.1 mg/dL — ABNORMAL HIGH (ref 0.3–1.2)
Total Protein: 5.8 g/dL — ABNORMAL LOW (ref 6.5–8.1)

## 2019-07-14 LAB — URINALYSIS, COMPLETE (UACMP) WITH MICROSCOPIC
Bacteria, UA: NONE SEEN
Bilirubin Urine: NEGATIVE
Glucose, UA: NEGATIVE mg/dL
Hgb urine dipstick: NEGATIVE
Ketones, ur: NEGATIVE mg/dL
Leukocytes,Ua: NEGATIVE
Nitrite: NEGATIVE
Protein, ur: NEGATIVE mg/dL
Specific Gravity, Urine: >1.046 — ABNORMAL HIGH (ref 1.005–1.030)
pH: 6 (ref 5.0–8.0)

## 2019-07-14 LAB — HEPATIC FUNCTION PANEL
ALT: 20 U/L (ref 0–44)
AST: 57 U/L — ABNORMAL HIGH (ref 15–41)
Albumin: 3.3 g/dL — ABNORMAL LOW (ref 3.5–5.0)
Alkaline Phosphatase: 354 U/L — ABNORMAL HIGH (ref 38–126)
Bilirubin, Direct: 1 mg/dL — ABNORMAL HIGH (ref 0.0–0.2)
Indirect Bilirubin: 1.1 mg/dL — ABNORMAL HIGH (ref 0.3–0.9)
Total Bilirubin: 2.1 mg/dL — ABNORMAL HIGH (ref 0.3–1.2)
Total Protein: 5.8 g/dL — ABNORMAL LOW (ref 6.5–8.1)

## 2019-07-14 LAB — CBC
HCT: 39.5 % (ref 36.0–46.0)
Hemoglobin: 12.7 g/dL (ref 12.0–15.0)
MCH: 24.3 pg — ABNORMAL LOW (ref 26.0–34.0)
MCHC: 32.2 g/dL (ref 30.0–36.0)
MCV: 75.7 fL — ABNORMAL LOW (ref 80.0–100.0)
Platelets: 279 10*3/uL (ref 150–400)
RBC: 5.22 MIL/uL — ABNORMAL HIGH (ref 3.87–5.11)
RDW: 22.1 % — ABNORMAL HIGH (ref 11.5–15.5)
WBC: 16.8 10*3/uL — ABNORMAL HIGH (ref 4.0–10.5)
nRBC: 0 % (ref 0.0–0.2)

## 2019-07-14 LAB — LIPASE, BLOOD: Lipase: 37 U/L (ref 11–51)

## 2019-07-14 LAB — PROTIME-INR
INR: 1.2 (ref 0.8–1.2)
Prothrombin Time: 14.9 seconds (ref 11.4–15.2)

## 2019-07-14 LAB — APTT: aPTT: 30 seconds (ref 24–36)

## 2019-07-14 IMAGING — CT CT ABD-PELV W/ CM
2 of 5 series · 15 of 46 positions shown, 17 images · IV contrast (APPLIED)
Comparison: [DATE]

CLINICAL DATA: Abdominal pain, bloody stools

EXAM:
CT ABDOMEN AND PELVIS WITH CONTRAST
TECHNIQUE: Multidetector CT imaging of the abdomen and pelvis was performed
using the standard protocol following bolus administration of
intravenous contrast.
CONTRAST:  100mL OMNIPAQUE IOHEXOL 300 MG/ML  SOLN

[Series 2: routine abd/pel with · axial · 0.68mm/px · z∈[-1092,-632]mm · 12 of 104 slices shown, 14 images]
[im 6/104  soft-tissue]
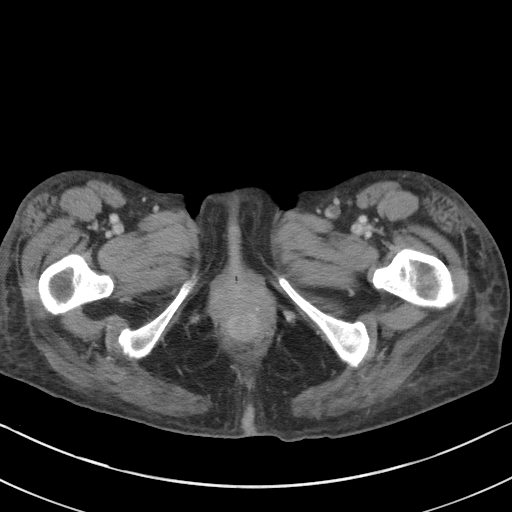
[im 6/104  bone]
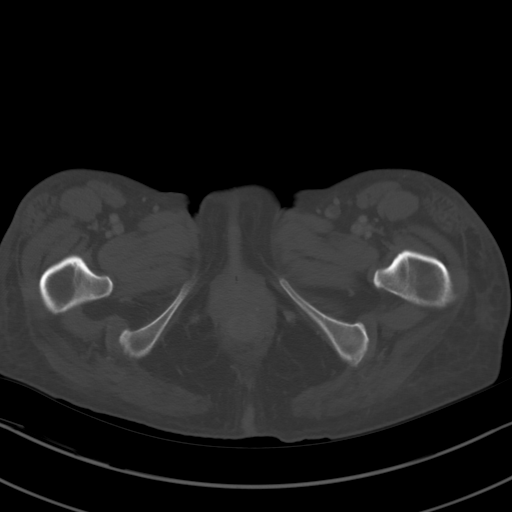
[im 18/104  soft-tissue]
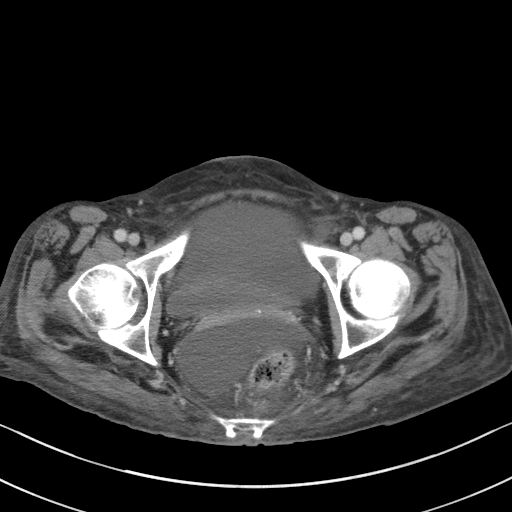
[im 23/104  soft-tissue]
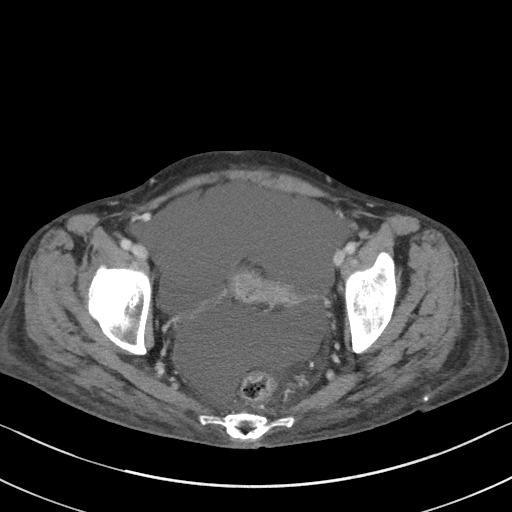
[im 29/104  soft-tissue]
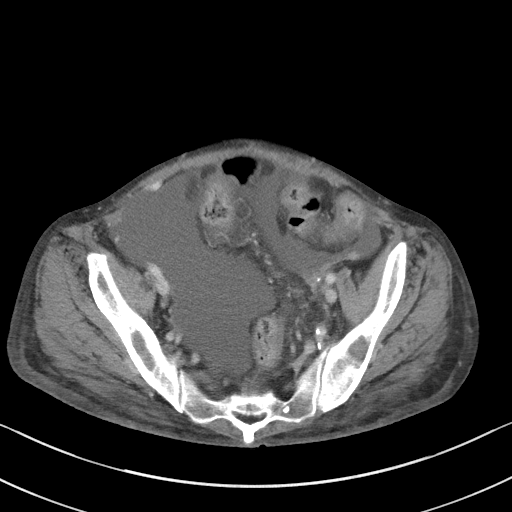
[im 41/104  soft-tissue]
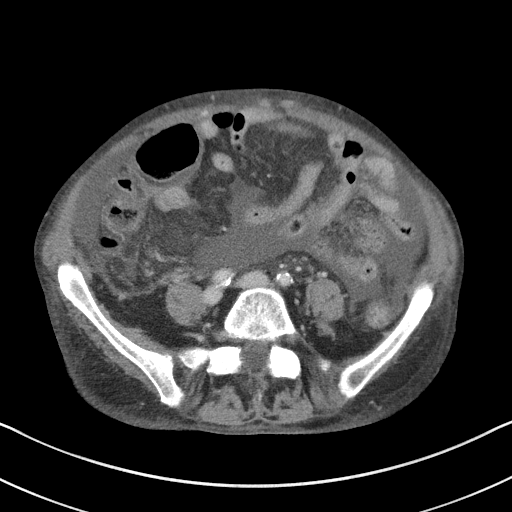
[im 46/104  soft-tissue]
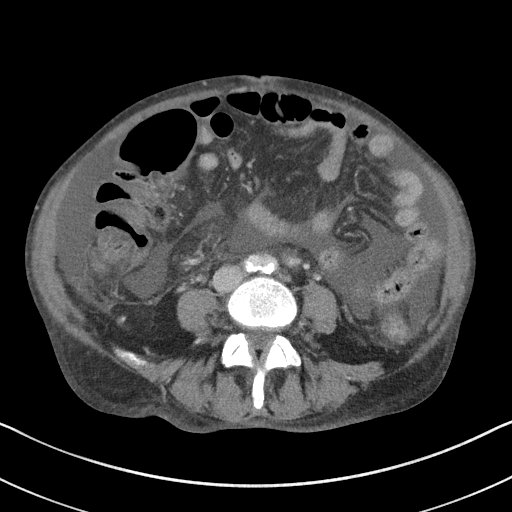
[im 58/104  soft-tissue]
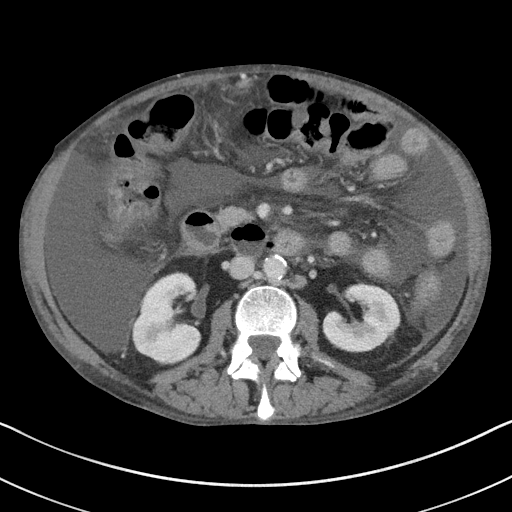
[im 63/104  soft-tissue]
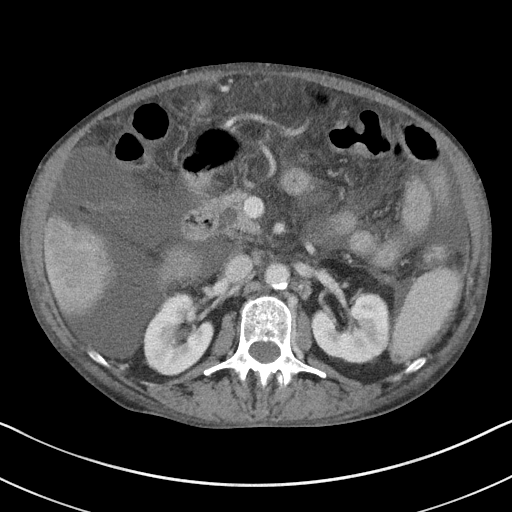
[im 75/104  soft-tissue]
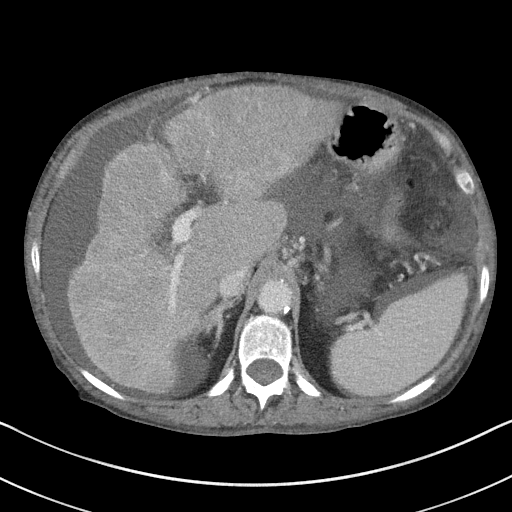
[im 75/104  bone]
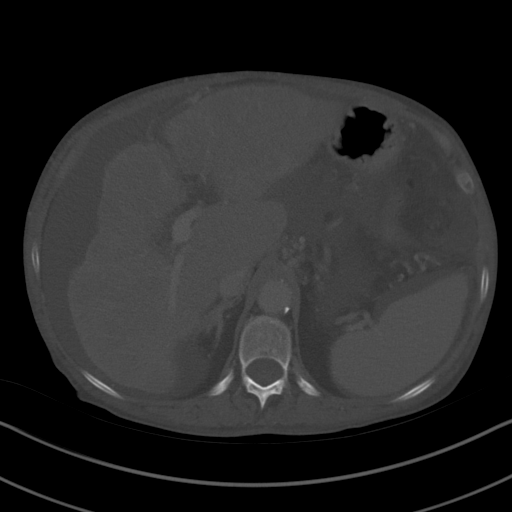
[im 81/104  soft-tissue]
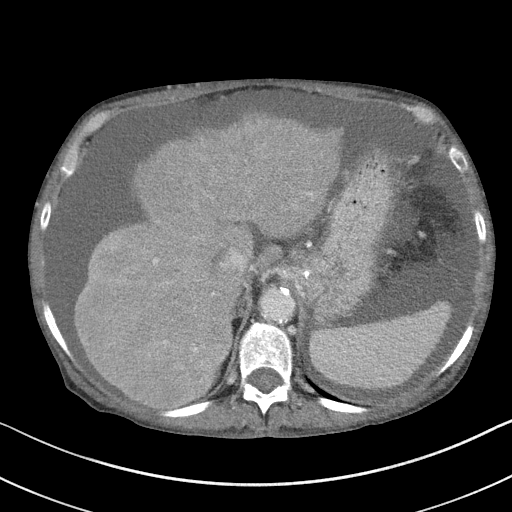
[im 86/104  soft-tissue]
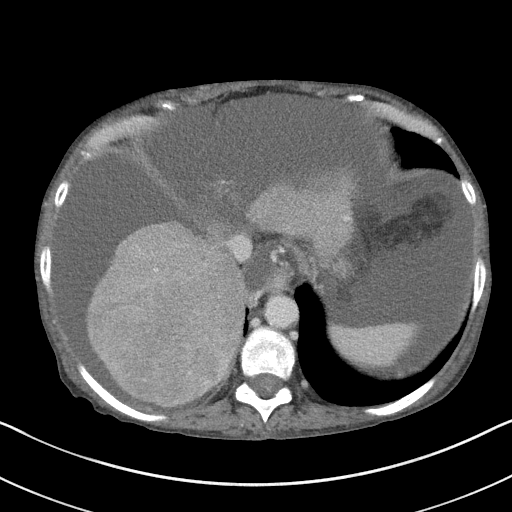
[im 98/104  soft-tissue]
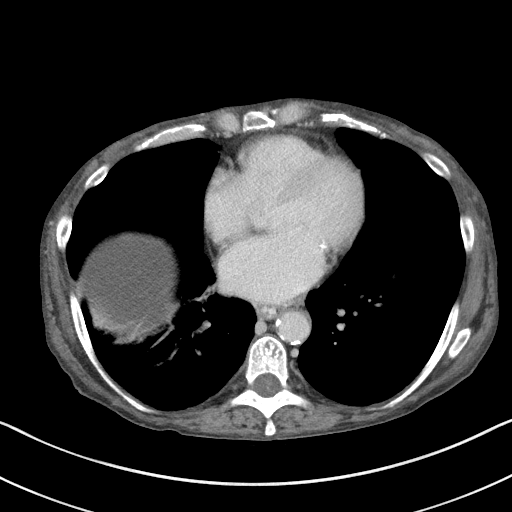

[Series 5: coronal st · coronal · 0.64mm/px · 3 of 88 slices shown]
[im 30/88  soft-tissue]
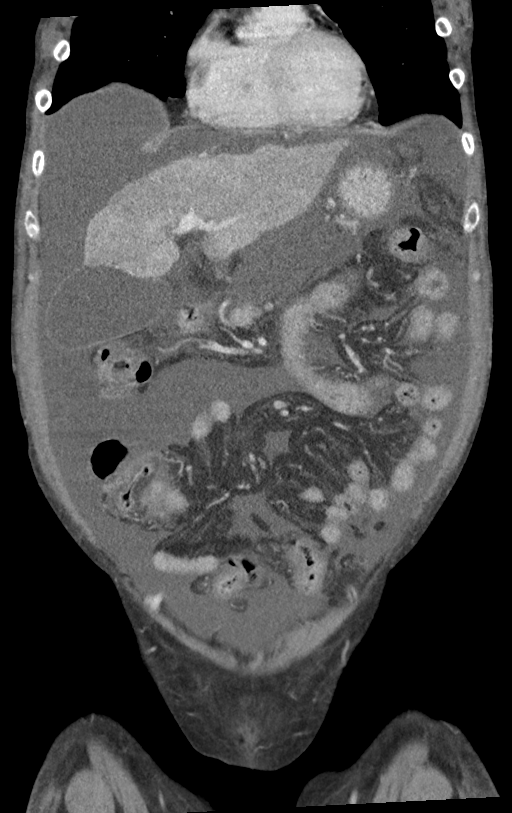
[im 39/88  soft-tissue]
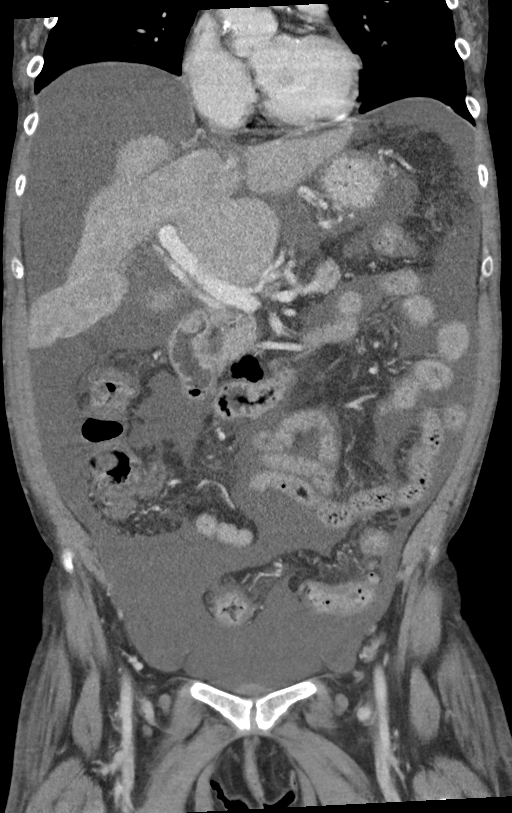
[im 49/88  soft-tissue]
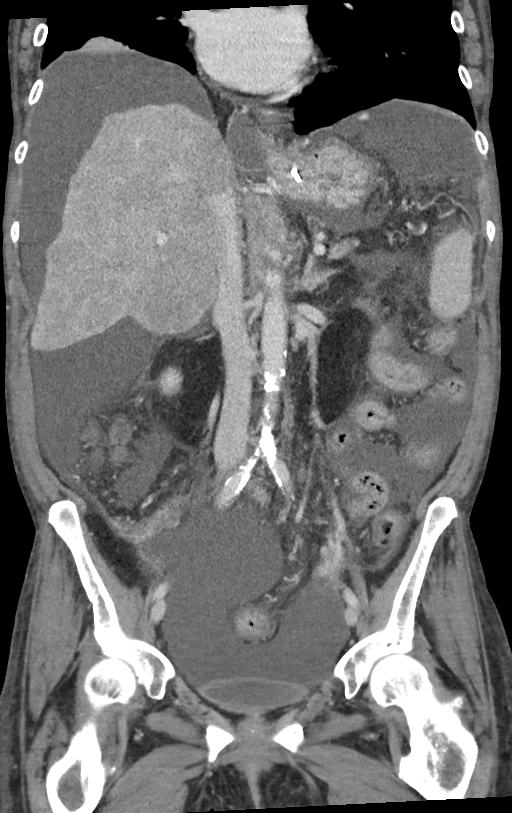

[15 of 46 positions shown; findings below may reference images not displayed]

FINDINGS: Lower chest: No acute abnormality

Hepatobiliary: Nodular contours of the liver compatible with
cirrhosis. Heterogeneous enhancement. Concern for possible mass in
the inferior right hepatic lobe versus regenerating nodule measuring
5.5 cm. No biliary ductal dilatation. Gallbladder is distended.

Pancreas: No focal abnormality or ductal dilatation.

Spleen: Splenomegaly with a craniocaudal length of 14.4 cm.

Adrenals/Urinary Tract: No adrenal abnormality. No focal renal
abnormality. No stones or hydronephrosis. Urinary bladder is
unremarkable.

Stomach/Bowel: Stomach, large and small bowel grossly unremarkable.

Vascular/Lymphatic: Aortic atherosclerosis. No enlarged abdominal or
pelvic lymph nodes.

Reproductive: Prior hysterectomy.  No adnexal masses.

Other: Large volume ascites in the abdomen and pelvis.

Musculoskeletal: No acute bony abnormality.
IMPRESSION: Changes of cirrhosis with splenomegaly and large volume ascites.

5.5 cm exophytic appearing masslike area off the inferior medial
right hepatic lobe. This could reflect regenerating nodule, but
recommend further evaluation with elective MRI of the liver to
exclude hepatocellular carcinoma.

Aortic atherosclerosis.

## 2019-07-14 MED ORDER — SPIRONOLACTONE 25 MG PO TABS
50.0000 mg | ORAL_TABLET | Freq: Every day | ORAL | Status: DC
  Administered 2019-07-15: 50 mg via ORAL
  Filled 2019-07-14 (×2): qty 2

## 2019-07-14 MED ORDER — MORPHINE SULFATE (PF) 2 MG/ML IV SOLN
2.0000 mg | INTRAVENOUS | Status: DC | PRN
  Administered 2019-07-15 – 2019-07-18 (×15): 2 mg via INTRAVENOUS
  Filled 2019-07-14 (×16): qty 1

## 2019-07-14 MED ORDER — SODIUM CHLORIDE 0.9 % IV BOLUS
1000.0000 mL | Freq: Once | INTRAVENOUS | Status: AC
  Administered 2019-07-14: 1000 mL via INTRAVENOUS

## 2019-07-14 MED ORDER — ACETAMINOPHEN 650 MG RE SUPP: 650.0000 mg | Freq: Four times a day (QID) | RECTAL | Status: DC | PRN

## 2019-07-14 MED ORDER — MORPHINE SULFATE (PF) 4 MG/ML IV SOLN
4.0000 mg | Freq: Once | INTRAVENOUS | Status: AC
  Administered 2019-07-14: 4 mg via INTRAVENOUS
  Filled 2019-07-14: qty 1

## 2019-07-14 MED ORDER — ACETAMINOPHEN 325 MG PO TABS: 650.0000 mg | ORAL_TABLET | Freq: Four times a day (QID) | ORAL | Status: DC | PRN

## 2019-07-14 MED ORDER — GABAPENTIN 100 MG PO CAPS
100.0000 mg | ORAL_CAPSULE | Freq: Two times a day (BID) | ORAL | Status: DC
  Administered 2019-07-15 – 2019-07-18 (×8): 100 mg via ORAL
  Filled 2019-07-14 (×9): qty 1

## 2019-07-14 MED ORDER — METOPROLOL SUCCINATE ER 25 MG PO TB24
25.0000 mg | ORAL_TABLET | Freq: Every day | ORAL | Status: DC
  Filled 2019-07-14: qty 1

## 2019-07-14 MED ORDER — SODIUM CHLORIDE 0.9 % IV SOLN
1.0000 g | Freq: Once | INTRAVENOUS | Status: AC
  Administered 2019-07-14: 1 g via INTRAVENOUS
  Filled 2019-07-14: qty 10

## 2019-07-14 MED ORDER — FUROSEMIDE 20 MG PO TABS
20.0000 mg | ORAL_TABLET | Freq: Every day | ORAL | Status: DC
  Administered 2019-07-15: 20 mg via ORAL
  Filled 2019-07-14 (×2): qty 1

## 2019-07-14 MED ORDER — SODIUM CHLORIDE 0.9 % IV SOLN
1.0000 g | Freq: Two times a day (BID) | INTRAVENOUS | Status: DC
  Administered 2019-07-15 – 2019-07-16 (×3): 1 g via INTRAVENOUS
  Filled 2019-07-14: qty 1
  Filled 2019-07-14: qty 10
  Filled 2019-07-14 (×2): qty 1
  Filled 2019-07-14: qty 10

## 2019-07-14 MED ORDER — HYDROMORPHONE HCL 1 MG/ML IJ SOLN
1.0000 mg | Freq: Once | INTRAMUSCULAR | Status: AC
  Administered 2019-07-14: 1 mg via INTRAVENOUS
  Filled 2019-07-14: qty 1

## 2019-07-14 MED ORDER — ONDANSETRON HCL 4 MG/2ML IJ SOLN: 4.0000 mg | Freq: Four times a day (QID) | INTRAMUSCULAR | Status: DC | PRN

## 2019-07-14 MED ORDER — DIGOXIN 125 MCG PO TABS
0.1250 mg | ORAL_TABLET | Freq: Every day | ORAL | Status: DC
  Administered 2019-07-15 – 2019-07-18 (×4): 0.125 mg via ORAL
  Filled 2019-07-14 (×5): qty 1

## 2019-07-14 MED ORDER — POTASSIUM CHLORIDE 20 MEQ PO PACK
40.0000 meq | PACK | Freq: Once | ORAL | Status: AC
  Administered 2019-07-15: 40 meq via ORAL
  Filled 2019-07-14: qty 2

## 2019-07-14 MED ORDER — PANTOPRAZOLE SODIUM 40 MG IV SOLR
40.0000 mg | Freq: Two times a day (BID) | INTRAVENOUS | Status: DC
  Administered 2019-07-15 – 2019-07-17 (×7): 40 mg via INTRAVENOUS
  Filled 2019-07-14 (×8): qty 40

## 2019-07-14 MED ORDER — BACLOFEN 10 MG PO TABS
10.0000 mg | ORAL_TABLET | Freq: Three times a day (TID) | ORAL | Status: DC
  Administered 2019-07-15 – 2019-07-18 (×11): 10 mg via ORAL
  Filled 2019-07-14 (×14): qty 1

## 2019-07-14 MED ORDER — FLUOXETINE HCL 10 MG PO CAPS
10.0000 mg | ORAL_CAPSULE | Freq: Every day | ORAL | Status: DC
  Administered 2019-07-15 – 2019-07-18 (×4): 10 mg via ORAL
  Filled 2019-07-14 (×5): qty 1

## 2019-07-14 MED ORDER — ADULT MULTIVITAMIN W/MINERALS CH
1.0000 | ORAL_TABLET | Freq: Every day | ORAL | Status: DC
  Administered 2019-07-15 – 2019-07-18 (×4): 1 via ORAL
  Filled 2019-07-14 (×6): qty 1

## 2019-07-14 MED ORDER — MIDODRINE HCL 5 MG PO TABS
5.0000 mg | ORAL_TABLET | Freq: Three times a day (TID) | ORAL | Status: DC
  Administered 2019-07-15 – 2019-07-18 (×10): 5 mg via ORAL
  Filled 2019-07-14 (×13): qty 1

## 2019-07-14 MED ORDER — ONDANSETRON HCL 4 MG PO TABS
4.0000 mg | ORAL_TABLET | Freq: Four times a day (QID) | ORAL | Status: DC | PRN
  Filled 2019-07-14 (×2): qty 1

## 2019-07-14 MED ORDER — VITAMIN B-1 100 MG PO TABS
100.0000 mg | ORAL_TABLET | Freq: Every day | ORAL | Status: DC
  Administered 2019-07-16 – 2019-07-18 (×3): 100 mg via ORAL
  Filled 2019-07-14 (×5): qty 1

## 2019-07-14 MED ORDER — POTASSIUM CHLORIDE 20 MEQ PO PACK: 40.0000 meq | PACK | Freq: Once | ORAL | Status: DC

## 2019-07-14 MED ORDER — ONDANSETRON HCL 4 MG/2ML IJ SOLN
4.0000 mg | Freq: Once | INTRAMUSCULAR | Status: AC
  Administered 2019-07-14: 4 mg via INTRAVENOUS
  Filled 2019-07-14: qty 2

## 2019-07-14 MED ORDER — ROPINIROLE HCL 0.25 MG PO TABS
0.5000 mg | ORAL_TABLET | Freq: Every day | ORAL | Status: DC
  Administered 2019-07-15 – 2019-07-17 (×3): 0.5 mg via ORAL
  Filled 2019-07-14 (×4): qty 2

## 2019-07-14 MED ORDER — TRAZODONE HCL 50 MG PO TABS
25.0000 mg | ORAL_TABLET | Freq: Every evening | ORAL | Status: DC | PRN
  Filled 2019-07-14: qty 0.5

## 2019-07-14 MED ORDER — FOLIC ACID 1 MG PO TABS
1.0000 mg | ORAL_TABLET | Freq: Every day | ORAL | Status: DC
  Administered 2019-07-15 – 2019-07-18 (×4): 1 mg via ORAL
  Filled 2019-07-14 (×5): qty 1

## 2019-07-14 MED ORDER — POTASSIUM CHLORIDE 10 MEQ/100ML IV SOLN
10.0000 meq | Freq: Once | INTRAVENOUS | Status: AC
  Administered 2019-07-14: 10 meq via INTRAVENOUS
  Filled 2019-07-14: qty 100

## 2019-07-14 MED ORDER — POTASSIUM CHLORIDE IN NACL 40-0.9 MEQ/L-% IV SOLN
INTRAVENOUS | Status: DC
  Administered 2019-07-15: 100 mL/h via INTRAVENOUS
  Filled 2019-07-14 (×6): qty 1000

## 2019-07-14 MED ORDER — IOHEXOL 300 MG/ML  SOLN
100.0000 mL | Freq: Once | INTRAMUSCULAR | Status: AC | PRN
  Administered 2019-07-14: 100 mL via INTRAVENOUS

## 2019-07-14 MED ORDER — TIZANIDINE HCL 4 MG PO TABS
4.0000 mg | ORAL_TABLET | Freq: Every day | ORAL | Status: DC
  Administered 2019-07-15 – 2019-07-17 (×4): 4 mg via ORAL
  Filled 2019-07-14 (×5): qty 1

## 2019-07-14 NOTE — ED Triage Notes (Signed)
Patient presents to the ED from home.  Patient reports abdominal pain and 4 episodes of frank blood/stools from her rectum today.  Patient states she recently had a similar issue and had to get multiple blood transfusions.  Patient states she has had abdominal pain x 2 weeks.  Patient reports paracentesis on Thursday.

## 2019-07-14 NOTE — ED Provider Notes (Signed)
[facility] Emergency Department Provider Note ____________________________________________   First MD Initiated Contact with Patient 07/14/19 1743     (approximate)  I have reviewed the triage vital signs and the nursing notes.   HISTORY  Chief Complaint Abdominal Pain and Rectal Bleeding    HPI Anita Reed is a 58 y.o. female with PMH as noted below who presents primarily with abdominal pain which she describes as constant, bilateral, and mainly in the lower abdomen but also affecting the upper abdomen.  The pain radiates to her legs as well.  She reports nausea but no vomiting.  The patient had a paracentesis done 2 days ago in which 5 L were taken off, but reports persistent pain.  She has also had several episodes of bright red blood in her stool since yesterday.  She has no other abnormal bleeding or bruising.  Past Medical History:  Diagnosis Date  . Anemia   . Cancer ([place])   . Cirrhosis ([place])   . Hypertension     Patient Active Problem List   Diagnosis Date Noted  . Goals of care, counseling/discussion   . Palliative care encounter   . Muscle cramping 06/22/2019  . Alcohol abuse 06/22/2019  . Sepsis ([place]) 06/22/2019  . GIB (gastrointestinal bleeding) 06/01/2019  . GI bleeding 06/01/2019  . History of lymphoma 02/26/2019  . Atrial flutter ([place]) 02/07/2019  . Alcoholic cirrhosis of liver with ascites ([place]) 02/07/2019  . Persistent atrial fibrillation ([place]) 02/07/2019  . Anemia 02/07/2019  . Anorexia 02/07/2019  . GI bleed 01/28/2019  . Ascites 01/18/2019  . Cancer [facility])     Past Surgical History:  Procedure Laterality Date  . ABDOMINAL HYSTERECTOMY    . APPENDECTOMY    . COLONOSCOPY N/A 06/03/2019   Procedure: COLONOSCOPY;  Surgeon: Toledo, Benay Pike, MD;  Location: [facility] ENDOSCOPY;  Service: Gastroenterology;  Laterality: N/A;  . ESOPHAGOGASTRODUODENOSCOPY N/A 01/29/2019   Procedure: ESOPHAGOGASTRODUODENOSCOPY (EGD);  Surgeon:  [name], MD;  Location: [facility] ENDOSCOPY;  Service: Gastroenterology;  Laterality: N/A;  . ESOPHAGOGASTRODUODENOSCOPY N/A 06/01/2019   Procedure: ESOPHAGOGASTRODUODENOSCOPY (EGD);  Surgeon: Toledo, Benay Pike, MD;  Location: [facility] ENDOSCOPY;  Service: Gastroenterology;  Laterality: N/A;  . TONSILLECTOMY      Prior to Admission medications   Medication Sig Start Date End Date Taking? Authorizing Provider  baclofen (LIORESAL) 10 MG tablet Take 1 tablet (10 mg total) by mouth 3 (three) times daily. Muscle Relaxer 06/22/19   Hubbard Hartshorn, FNP  digoxin (LANOXIN) 0.125 MG tablet Take 1 tablet (0.125 mg total) by mouth daily. 06/29/19   Max Sane, MD  FLUoxetine (PROZAC) 10 MG capsule Take 1 capsule (10 mg total) by mouth daily. 06/29/19   Max Sane, MD  furosemide (LASIX) 40 MG tablet Take 0.5 tablets (20 mg total) by mouth daily. 06/28/19   Max Sane, MD  gabapentin (NEURONTIN) 100 MG capsule Take 1 capsule (100 mg total) by mouth 2 (two) times daily. 06/28/19   Max Sane, MD  metoprolol succinate (TOPROL-XL) 25 MG 24 hr tablet Take 1 tablet (25 mg total) by mouth daily. 06/29/19   Max Sane, MD  midodrine (PROAMATINE) 5 MG tablet Take 1 tablet (5 mg total) by mouth 3 (three) times daily with meals. 06/29/19   Max Sane, MD  pantoprazole (PROTONIX) 40 MG tablet Take 1 tablet (40 mg total) by mouth 2 (two) times daily. 01/31/19 01/31/20  Saundra Shelling, MD  spironolactone (ALDACTONE) 50 MG tablet Take 1 tablet (50 mg total) by  mouth daily. 06/29/19   Max Sane, MD  tiZANidine (ZANAFLEX) 4 MG capsule Take 1 capsule (4 mg total) by mouth at bedtime. 06/29/19 07/29/19  [name], MD    Allergies Patient has no known allergies.  Family History  Problem Relation Age of Onset  . Cancer Mother   . Cancer Father     Social History Social History   Tobacco Use  . Smoking status: Current Every Day Smoker  . Smokeless tobacco: Never Used  Substance Use Topics  .  Alcohol use: Not Currently  . Drug use: Never    Review of Systems  Constitutional: No fever. Eyes: No visual changes. ENT: No sore throat. Cardiovascular: Denies chest pain. Respiratory: Denies shortness of breath. Gastrointestinal: Positive for nausea, no vomiting. Genitourinary: Negative for dysuria.  Musculoskeletal: Positive for leg pain. Skin: Negative for rash. Neurological: Negative for headache.   ____________________________________________   PHYSICAL EXAM:  VITAL SIGNS: ED Triage Vitals [07/14/19 1554]  Enc Vitals Group     BP (!) 153/84     Pulse Rate 90     Resp 16     Temp 99 F (37.2 C)     Temp Source Oral     SpO2 98 %     Weight 130 lb (59 kg)     Height 5\' 9"  (1.753 m)     Head Circumference      Peak Flow      Pain Score 10     Pain Loc      Pain Edu?      Excl. in [place]?     Constitutional: Alert and oriented.  Uncomfortable appearing but in no acute distress. Eyes: Conjunctivae are normal.  No scleral icterus. Head: Atraumatic. Nose: No congestion/rhinnorhea. Mouth/Throat: Mucous membranes are slightly dry.   Neck: Normal range of motion.  Cardiovascular: Normal rate, regular rhythm. Good peripheral circulation. Respiratory: Normal respiratory effort.  No retractions.  Gastrointestinal: Soft with mild distention and bilateral lower quadrant discomfort to palpation but no focal tenderness or peritoneal signs. Genitourinary: No flank tenderness. Musculoskeletal: 1+ bilateral lower extremity edema.  Extremities warm and well perfused.  Neurologic:  Normal speech and language. No gross focal neurologic deficits are appreciated.  Skin:  Skin is warm and dry. No rash noted. Psychiatric: Mood and affect are normal. Speech and behavior are normal.  ____________________________________________   LABS (all labs ordered are listed, but only abnormal results are displayed)  Labs Reviewed  CBC - Abnormal; Notable for the following components:       Result Value   WBC 16.8 (*)    RBC 5.22 (*)    MCV 75.7 (*)    MCH 24.3 (*)    RDW 22.1 (*)    All other components within normal limits  COMPREHENSIVE METABOLIC PANEL - Abnormal; Notable for the following components:   Potassium 2.6 (*)    Calcium 7.8 (*)    Total Protein 5.8 (*)    Albumin 3.3 (*)    AST 57 (*)    Alkaline Phosphatase 361 (*)    Total Bilirubin 2.1 (*)    All other components within normal limits  HEPATIC FUNCTION PANEL - Abnormal; Notable for the following components:   Total Protein 5.8 (*)    Albumin 3.3 (*)    AST 57 (*)    Alkaline Phosphatase 354 (*)    Total Bilirubin 2.1 (*)    Bilirubin, Direct 1.0 (*)    Indirect Bilirubin 1.1 (*)  All other components within normal limits  SARS CORONAVIRUS 2 (TAT 6-24 HRS)  PROTIME-INR  APTT  LIPASE, BLOOD  URINALYSIS, COMPLETE (UACMP) WITH MICROSCOPIC  POC OCCULT BLOOD, ED  TYPE AND SCREEN   ____________________________________________  EKG   ____________________________________________  RADIOLOGY  CT abdomen: Cirrhosis with ascites.  No evidence of colitis.  5.5 cm masslike area off inferior medial right hepatic lobe.  ____________________________________________   PROCEDURES  Procedure(s) performed: No  Procedures  Critical Care performed: No ____________________________________________   INITIAL IMPRESSION / ASSESSMENT AND PLAN / ED COURSE  Pertinent labs & imaging results that were available during my care of the patient were reviewed by me and considered in my medical decision making (see chart for details).  58 year old female with PMH as noted above including liver cirrhosis and prior GI bleed presents with worsening bilateral abdominal pain over the last week and bright red blood in her stools over the last day.  I reviewed the past medical records in [place].  The patient was admitted last month with hypotension and concern for sepsis, and multiple electrolyte abnormalities.  She  was also admitted at the beginning of last month with GI bleeding, pseudomembranous enterocolitis, and acute on chronic anemia requiring transfusion.  The patient had upper and lower endoscopy during this admission revealing esophageal varices with no evidence of active bleeding.  The GI bleed was thought to be due to hemorrhagic colitis.  On exam today the patient is uncomfortable appearing but in no distress.  Her vital signs are normal except for hypertension.  She has mild distention to the abdomen and discomfort to deep palpation, but no peritoneal signs or focal tenderness.  Initial lab work-up reveals stable hemoglobin, but elevated WBC count.  Given this finding, my primary concern is for recurrent colitis as the cause of the pain and GI bleeding.  Presentation is most consistent with a lower GI source, so I have a low suspicion for acute variceal bleeding or gastritis.  Differential also includes other intra-abdominal infection, or less likely SBP.  We will obtain additional lab work-up, CT abdomen, and reassess.  I anticipate likely admission.  ----------------------------------------- 8:56 PM on 07/14/2019 -----------------------------------------  Bilirubin is slightly elevated, and LFTs are also slightly elevated although no significant difference from 2 weeks ago.  The patient is also hypokalemic, and I have ordered IV potassium for repletion.  CT shows no evidence of acute colitis, so the source of the lower GI bleeding is unclear although the patient does have a history of hemorrhoids.  Given the patient's elevated risk, I would like to admit to monitor her overnight with serial hemoglobins.  My suspicion for SBP is still low given that the patient has no fever, no significant peritoneal signs, and had a paracentesis two days ago.  I discussed the case with hospitalist Dr. Sidney Ace for admission, and based on discussion with him, we will cover empirically with ceftriaxone given the  rising WBC count.  ________________________________  [name] was evaluated in Emergency Department on 07/14/2019 for the symptoms described in the history of present illness. She was evaluated in the context of the global COVID-19 pandemic, which necessitated consideration that the patient might be at risk for infection with the SARS-CoV-2 virus that causes COVID-19. Institutional protocols and algorithms that pertain to the evaluation of patients at risk for COVID-19 are in a state of rapid change based on information released by regulatory bodies including the CDC and federal and state organizations. These policies and algorithms were followed  during the patient's care in the ED.  ____________________________________________   FINAL CLINICAL IMPRESSION(S) / ED DIAGNOSES  Final diagnoses:  Lower GI bleed      NEW MEDICATIONS STARTED DURING THIS VISIT:  New Prescriptions   No medications on file     Note:  This document was prepared using Dragon voice recognition software and may include unintentional dictation errors.    Arta Silence, MD 07/14/19 [phone]

## 2019-07-14 NOTE — H&P (Addendum)
[place] [facility] [place] NAME: Anita Reed    MR#:  [id number]  DATE OF BIRTH:  1960/12/15  DATE OF ADMISSION:  07/14/2019  PRIMARY CARE PHYSICIAN: Hubbard Hartshorn, FNP   REQUESTING/REFERRING PHYSICIAN: Arta Silence, MD CHIEF COMPLAINT:   Chief Complaint  Patient presents with   Abdominal Pain   Rectal Bleeding    HISTORY OF PRESENT ILLNESS:  Anita Reed  is a 58 y.o. Caucasian female with a known history of alcoholic liver cirrhosis with ascites as well as previous history of GI bleeding that was thought to be secondary to hemorrhagic colitis, who presented to the emergency room with acute onset of bright red bleeding per rectum with associated abdominal pain that started at 2 AM and the bleeding recurred about 4 times then stopped.  Her abdominal pain however has been worsening.  She denied any fever or chills but admitted to nausea as well as diarrhea with loose and watery bowel movement.  No chest pain or dyspnea or cough or wheezing.  No recent exposures to COVID-19.  No headache or dizziness or blurred vision.  2 days ago the patient underwent successful ultrasound-guided paracentesis yielding 5.7 L of peritoneal fluid for his recurrent ascites.  Upon presentation to the emergency room, blood pressure was 153/84 with otherwise normal vital signs.  Labs revealed hypokalemia of 2.6 and a calcium of 7.8 with albumin of 3.3, alk phos of 361 AST 57 and serum lipase was 37 with total bili of 2.1 and total protein of 5.8.  CBC showed leukocytosis of 16.8.  INR was 1.2 and PT 13.9.  The patient was typed and screened and his blood type was O+ with negative antibody screen.  Pelvic CT scan showed changes of cirrhosis with splenomegaly and large volume ascites as well as 5.5 cm exophytic appearing masslike area of the inferior medial right hepatic lobe that could reflect regenerating nodule with recommendation for elective MRI of the liver to exclude  hepatocellular carcinoma.  He also showed aortic atherosclerosis.  The patient was given 1 g IV Rocephin and 1 mg of IV Dilaudid with 4 mg of IV morphine sulfate and 4 mg of IV Zofran, 10 mEq IV potassium chloride and 1 L bolus of IV normal saline.  He will be admitted to a medically monitored bed for further evaluation and management. PAST MEDICAL HISTORY:   Past Medical History:  Diagnosis Date   Anemia    Cancer ([place])    Cirrhosis ([place])    Hypertension   Atrial flutter/fibrillation, GI bleeding, alcoholic liver cirrhosis with ascites, history of lymphoma, alcohol abuse   PAST SURGICAL HISTORY:   Past Surgical History:  Procedure Laterality Date   ABDOMINAL HYSTERECTOMY     APPENDECTOMY     COLONOSCOPY N/A 06/03/2019   Procedure: COLONOSCOPY;  Surgeon: Toledo, Benay Pike, MD;  Location: [facility] ENDOSCOPY;  Service: Gastroenterology;  Laterality: N/A;   ESOPHAGOGASTRODUODENOSCOPY N/A 01/29/2019   Procedure: ESOPHAGOGASTRODUODENOSCOPY (EGD);  Surgeon: [name], MD;  Location: [facility] ENDOSCOPY;  Service: Gastroenterology;  Laterality: N/A;   ESOPHAGOGASTRODUODENOSCOPY N/A 06/01/2019   Procedure: ESOPHAGOGASTRODUODENOSCOPY (EGD);  Surgeon: Toledo, Benay Pike, MD;  Location: [facility] ENDOSCOPY;  Service: Gastroenterology;  Laterality: N/A;   TONSILLECTOMY      SOCIAL HISTORY:   Social History   Tobacco Use   Smoking status: Current Every Day Smoker   Smokeless tobacco: Never Used  Substance Use Topics   Alcohol use: Not Currently    FAMILY HISTORY:  Family History  Problem Relation Age of Onset   Cancer Mother    Cancer Father     DRUG ALLERGIES:  No Known Allergies  REVIEW OF SYSTEMS:   ROS As per history of present illness. All pertinent systems were reviewed above. Constitutional,  HEENT, cardiovascular, respiratory, GI, GU, musculoskeletal, neuro, psychiatric, endocrine,  integumentary and hematologic systems were reviewed and are otherwise    negative/unremarkable except for positive findings mentioned above in the HPI.   MEDICATIONS AT HOME:   Prior to Admission medications   Medication Sig Start Date End Date Taking? Authorizing Provider  baclofen (LIORESAL) 10 MG tablet Take 1 tablet (10 mg total) by mouth 3 (three) times daily. Muscle Relaxer 06/22/19   Hubbard Hartshorn, FNP  digoxin (LANOXIN) 0.125 MG tablet Take 1 tablet (0.125 mg total) by mouth daily. 06/29/19   Max Sane, MD  FLUoxetine (PROZAC) 10 MG capsule Take 1 capsule (10 mg total) by mouth daily. 06/29/19   Max Sane, MD  furosemide (LASIX) 40 MG tablet Take 0.5 tablets (20 mg total) by mouth daily. 06/28/19   Max Sane, MD  gabapentin (NEURONTIN) 100 MG capsule Take 1 capsule (100 mg total) by mouth 2 (two) times daily. 06/28/19   Max Sane, MD  metoprolol succinate (TOPROL-XL) 25 MG 24 hr tablet Take 1 tablet (25 mg total) by mouth daily. 06/29/19   Max Sane, MD  midodrine (PROAMATINE) 5 MG tablet Take 1 tablet (5 mg total) by mouth 3 (three) times daily with meals. 06/29/19   Max Sane, MD  pantoprazole (PROTONIX) 40 MG tablet Take 1 tablet (40 mg total) by mouth 2 (two) times daily. 01/31/19 01/31/20  Saundra Shelling, MD  spironolactone (ALDACTONE) 50 MG tablet Take 1 tablet (50 mg total) by mouth daily. 06/29/19   Max Sane, MD  tiZANidine (ZANAFLEX) 4 MG capsule Take 1 capsule (4 mg total) by mouth at bedtime. 06/29/19 07/29/19  [name], MD      VITAL SIGNS:  Blood pressure (!) 141/77, pulse (!) 102, temperature 99 F (37.2 C), temperature source Oral, resp. rate 16, height _0  (1.753 m), weight 59 kg, SpO2 98 %.  PHYSICAL EXAMINATION:  Physical Exam  GENERAL:  58 y.o.-year-old Caucasian female patient lying in the bed with no acute distress.  EYES: Pupils equal, round, reactive to light and accommodation.  Positive scleral icterus. Extraocular muscles intact.  HEENT: Head atraumatic, normocephalic. Oropharynx and nasopharynx  clear.  NECK:  Supple, no jugular venous distention. No thyroid enlargement, no tenderness.  LUNGS: Normal breath sounds bilaterally, no wheezing, rales,rhonchi or crepitation. No use of accessory muscles of respiration.  CARDIOVASCULAR: Regular rate and rhythm, S1, S2 normal. No murmurs, rubs, or gallops.  ABDOMEN: Soft, mildly distended, with mild generalized tenderness mainly in the epigastric area.  Bowel sounds present. No palpable organomegaly or mass due to ascites.  She had mild positive shifting dullness. EXTREMITIES: 1-2+ bilateral lower extremity pitting edema, with no cyanosis, or clubbing.  NEUROLOGIC: Cranial nerves II through XII are intact. Muscle strength 5/5 in all extremities. Sensation intact. Gait not checked.  She had restless leg during my interview PSYCHIATRIC: The patient is alert and oriented x 3.  Normal affect and good eye contact. SKIN: No obvious rash, lesion, or ulcer.   LABORATORY PANEL:   CBC Recent Labs  Lab 07/14/19 1603  WBC 16.8*  HGB 12.7  HCT 39.5  PLT 279   ------------------------------------------------------------------------------------------------------------------  Chemistries  Recent Labs  Lab 07/14/19 1754  NA  138  K 2.6*  CL 102  CO2 22  GLUCOSE 96  BUN 8  CREATININE 0.58  CALCIUM 7.8*  AST 57*   57*  ALT 20   20  ALKPHOS 354*   361*  BILITOT 2.1*   2.1*   ------------------------------------------------------------------------------------------------------------------  Cardiac Enzymes No results for input(s): TROPONINI in the last 168 hours. ------------------------------------------------------------------------------------------------------------------  RADIOLOGY:  Ct Abdomen Pelvis W Contrast  Result Date: 07/14/2019 CLINICAL DATA:  Abdominal pain, bloody stools EXAM: CT ABDOMEN AND PELVIS WITH CONTRAST TECHNIQUE: Multidetector CT imaging of the abdomen and pelvis was performed using the standard protocol following  bolus administration of intravenous contrast. CONTRAST:  161m OMNIPAQUE IOHEXOL 300 MG/ML  SOLN COMPARISON:  06/23/2019 FINDINGS: Lower chest: No acute abnormality Hepatobiliary: Nodular contours of the liver compatible with cirrhosis. Heterogeneous enhancement. Concern for possible mass in the inferior right hepatic lobe versus regenerating nodule measuring 5.5 cm. No biliary ductal dilatation. Gallbladder is distended. Pancreas: No focal abnormality or ductal dilatation. Spleen: Splenomegaly with a craniocaudal length of 14.4 cm. Adrenals/Urinary Tract: No adrenal abnormality. No focal renal abnormality. No stones or hydronephrosis. Urinary bladder is unremarkable. Stomach/Bowel: Stomach, large and small bowel grossly unremarkable. Vascular/Lymphatic: Aortic atherosclerosis. No enlarged abdominal or pelvic lymph nodes. Reproductive: Prior hysterectomy.  No adnexal masses. Other: Large volume ascites in the abdomen and pelvis. Musculoskeletal: No acute bony abnormality. IMPRESSION: Changes of cirrhosis with splenomegaly and large volume ascites. 5.5 cm exophytic appearing masslike area off the inferior medial right hepatic lobe. This could reflect regenerating nodule, but recommend further evaluation with elective MRI of the liver to exclude hepatocellular carcinoma. Aortic atherosclerosis. Electronically Signed   By: KRolm BaptiseM.D.   On: 07/14/2019 19:35      IMPRESSION AND PLAN:   1.  GI bleeding with bright red blood per rectum likely of lower GI etiology.  The patient will be admitted to a medically monitored bed.  We will follow serial hemoglobins and hematocrits.  He was typed and screened.  At this time he does not need tagged red blood cells transfusion.  He will be hydrated with IV normal saline with potassium supplementation.  Will empirically place her on IV Protonix. Will give a bolus of IV Octreotide followed by infusion. Differential diagnosis would include hemorrhoids, colonic mass or  ulces or AVMs with inability to rule out upper GI source. A GI consultation will be obtained by Dr. TBonna Gains  I contacted her regarding the patient.  2.  Abdominal pain in the setting of alcoholic liver cirrhosis with ascites and leukocytosis concerning for possible spontaneous bacterial peritonitis.  The patient will be placed on IV Rocephin.  He just had a paracentesis of 5.7 L a couple of days ago.  We will continue monitoring his abdominal distention/ascites.  We will continue Lasix and Aldactone.  Will obtain abdominal MRI for further assessment of inferior medial right hepatic lobe mass  3.  Hypokalemia.  We will aggressively replace potassium and check magnesium level.  4.  Paroxysmal atrial flutter/fibrillation.  Will check EKG.  We will continue digoxin.  5.  Hypertension.  We will continue Toprol-XL.  6.  Anxiety.  We will continue Prozac.  Restless leg syndrome.  We will start Requip.  7.  DVT prophylaxis.  SCDs.  Medical prophylaxis is currently contraindicated due to GI bleeding.  GI prophylaxis was addressed above.    All the records are reviewed and case discussed with ED provider. The plan of care was discussed in details with the  patient (and family). I answered all questions. The patient agreed to proceed with the above mentioned plan. Further management will depend upon hospital course.   CODE STATUS: DNR/DNI code  TOTAL TIME TAKING CARE OF THIS PATIENT: 55 minutes.    Christel Mormon M.D on 07/14/2019 at 9:23 PM  [facility]   From 7 PM-7 AM, contact night-coverage [email or web address]  CC: Primary care physician; Hubbard Hartshorn, FNP   Note: This dictation was prepared with Dragon dictation along with smaller phrase technology. Any transcriptional errors that result from this process are unintentional.

## 2019-07-14 NOTE — ED Notes (Signed)
Pt states bright red blood in stool since yesterday.

## 2019-07-15 ENCOUNTER — Inpatient Hospital Stay: Payer: Medicare Other

## 2019-07-15 ENCOUNTER — Encounter: Payer: Self-pay | Admitting: Internal Medicine

## 2019-07-15 DIAGNOSIS — K625 Hemorrhage of anus and rectum: Secondary | ICD-10-CM

## 2019-07-15 DIAGNOSIS — D72825 Bandemia: Secondary | ICD-10-CM

## 2019-07-15 DIAGNOSIS — I959 Hypotension, unspecified: Secondary | ICD-10-CM

## 2019-07-15 DIAGNOSIS — K7031 Alcoholic cirrhosis of liver with ascites: Secondary | ICD-10-CM

## 2019-07-15 DIAGNOSIS — E876 Hypokalemia: Secondary | ICD-10-CM | POA: Diagnosis present

## 2019-07-15 LAB — BASIC METABOLIC PANEL
Anion gap: 8 (ref 5–15)
BUN: 8 mg/dL (ref 6–20)
CO2: 25 mmol/L (ref 22–32)
Calcium: 7.7 mg/dL — ABNORMAL LOW (ref 8.9–10.3)
Chloride: 103 mmol/L (ref 98–111)
Creatinine, Ser: 0.54 mg/dL (ref 0.44–1.00)
GFR calc Af Amer: >60 mL/min (ref >60)
GFR calc non Af Amer: >60 mL/min (ref >60)
Glucose, Bld: 98 mg/dL (ref 70–99)
Potassium: 3.1 mmol/L — ABNORMAL LOW (ref 3.5–5.1)
Sodium: 136 mmol/L (ref 135–145)

## 2019-07-15 LAB — CBC
HCT: 28.1 % — ABNORMAL LOW (ref 36.0–46.0)
Hemoglobin: 9.1 g/dL — ABNORMAL LOW (ref 12.0–15.0)
MCH: 25.1 pg — ABNORMAL LOW (ref 26.0–34.0)
MCHC: 32.4 g/dL (ref 30.0–36.0)
MCV: 77.6 fL — ABNORMAL LOW (ref 80.0–100.0)
Platelets: 187 10*3/uL (ref 150–400)
RBC: 3.62 MIL/uL — ABNORMAL LOW (ref 3.87–5.11)
RDW: 21.5 % — ABNORMAL HIGH (ref 11.5–15.5)
WBC: 18.8 10*3/uL — ABNORMAL HIGH (ref 4.0–10.5)
nRBC: 0 % (ref 0.0–0.2)

## 2019-07-15 LAB — GASTROINTESTINAL PANEL BY PCR, STOOL (REPLACES STOOL CULTURE)

## 2019-07-15 LAB — HEMOGLOBIN AND HEMATOCRIT, BLOOD
HCT: 40.1 % (ref 36.0–46.0)
HCT: 41.6 % (ref 36.0–46.0)
Hemoglobin: 12.3 g/dL (ref 12.0–15.0)
Hemoglobin: 13.2 g/dL (ref 12.0–15.0)

## 2019-07-15 LAB — MAGNESIUM
Magnesium: 1.4 mg/dL — ABNORMAL LOW (ref 1.7–2.4)
Magnesium: 2.2 mg/dL (ref 1.7–2.4)

## 2019-07-15 LAB — SARS CORONAVIRUS 2 (TAT 6-24 HRS): SARS Coronavirus 2: NEGATIVE

## 2019-07-15 LAB — OCCULT BLOOD X 1 CARD TO LAB, STOOL: Fecal Occult Bld: POSITIVE — AB

## 2019-07-15 LAB — C DIFFICILE QUICK SCREEN W PCR REFLEX
C Diff antigen: POSITIVE — AB
C Diff interpretation: DETECTED
C Diff toxin: POSITIVE — AB

## 2019-07-15 LAB — PREPARE RBC (CROSSMATCH)

## 2019-07-15 IMAGING — MR MR ABDOMEN WO/W CM
11 series · 48 of 48 positions shown · IV contrast (gadavist)
Comparison: CT AP [DATE]

CLINICAL DATA: Abdominal pain.  Cirrhosis.  Evaluate liver lesion.

EXAM:
MRI ABDOMEN WITHOUT AND WITH CONTRAST
TECHNIQUE: Multiplanar multisequence MR imaging of the abdomen was performed
both before and after the administration of intravenous contrast.
CONTRAST:  5mL GADAVIST GADOBUTROL 1 MMOL/ML IV SOLN

[Series 3: T2 · coronal · 6.0mm · 1.19mm/px · 2 of 35 slices shown (1 of 2)]
[im 1/35]
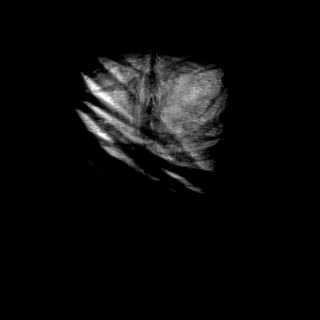
[im 35/35]
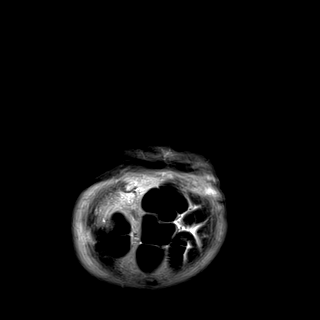

[Series 4: T2 · axial · 6.0mm · 1.19mm/px · z∈[-119,+119]mm · 2 of 34 slices shown (2 of 2)]
[im 1/34]
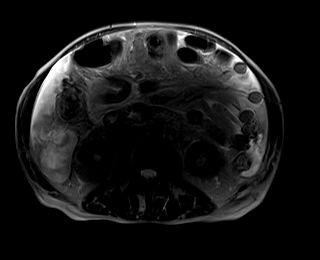
[im 34/34]
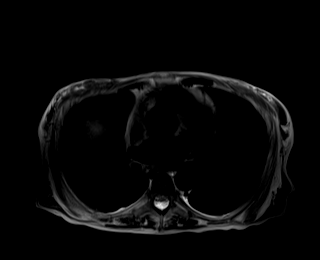

[Series 7: T2 fat-sat · axial · 6.0mm · 1.19mm/px · z∈[-119,+119]mm · 2 of 34 slices shown]
[im 1/34]
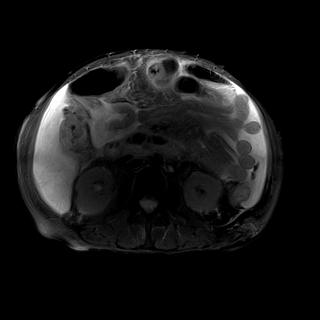
[im 34/34]
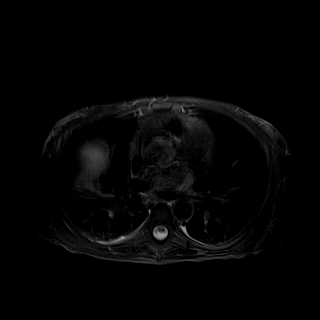

[Series 8: T1 · axial · 6.0mm · 0.74mm/px · z∈[-119,+119]mm · 3 of 68 slices shown]
[im 1/68]
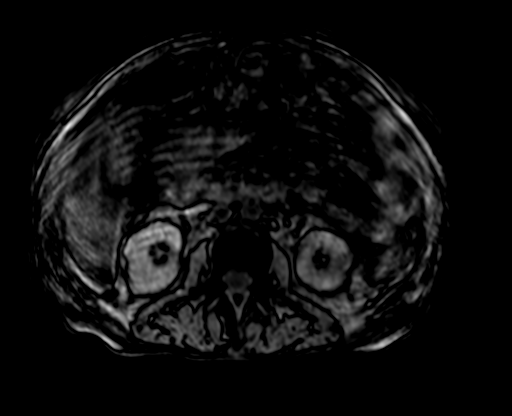
[im 34/68]
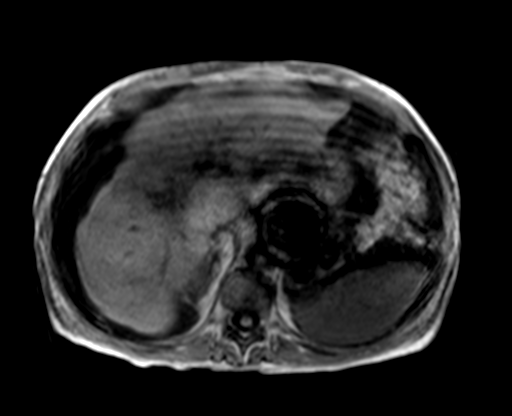
[im 68/68]
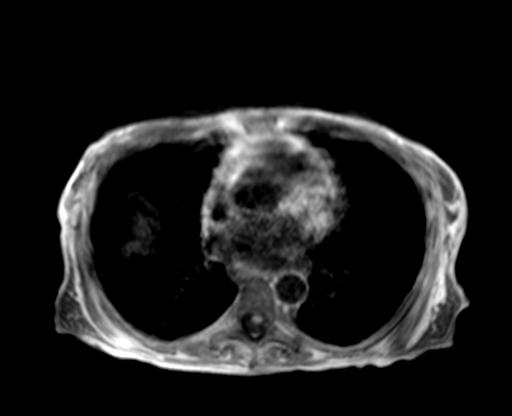

[Series 9: ax dwi_tracew · axial · 6.0mm · 1.42mm/px · z∈[-119,+119]mm · 4 of 102 slices shown]
[im 1/102]
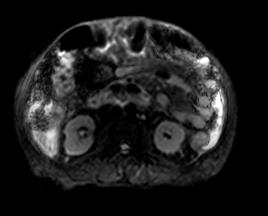
[im 34/102]
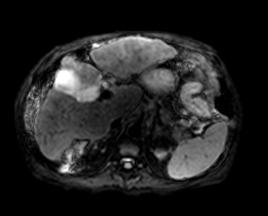
[im 68/102]
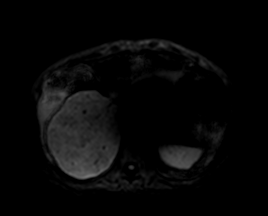
[im 102/102]
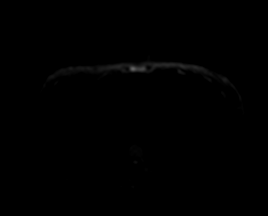

[Series 10: ax dwi_adc · axial · 6.0mm · 1.42mm/px · 1 of 34 slices shown]
[im 1/34]
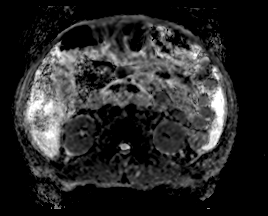

[Series 11: bSSFP · axial · 6.0mm · 0.74mm/px · 1 of 34 slices shown]
[im 1/34]
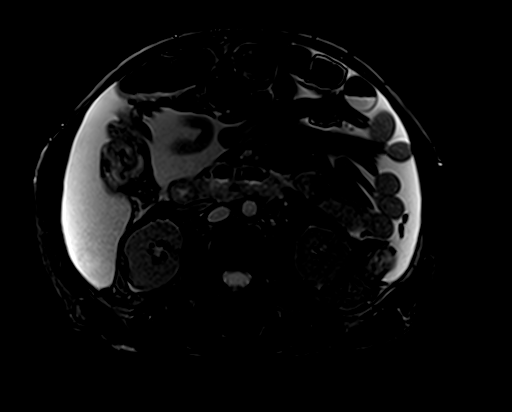

[Series 12: T1 fat-sat · axial · 3.0mm · 1.56mm/px · z∈[-106,+106]mm · 3 of 72 slices shown (1 of 2)]
[im 1/72]
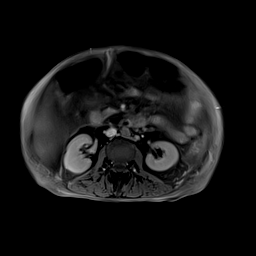
[im 36/72]
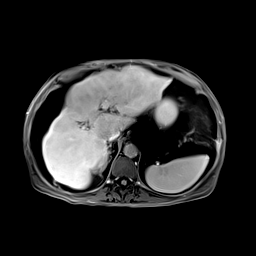
[im 72/72]
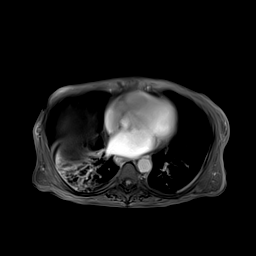

[Series 13: T1 dynamic post-contrast · coronal · 3.0mm · 1.31mm/px · 3 of 80 slices shown]
[im 1/80]
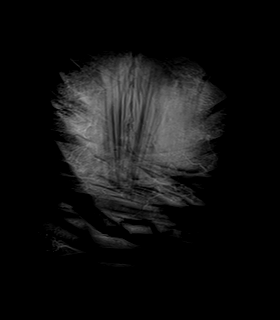
[im 40/80]
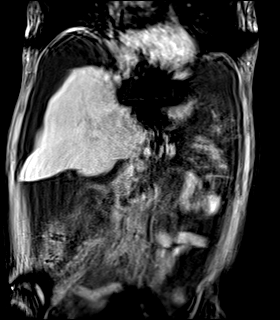
[im 80/80]
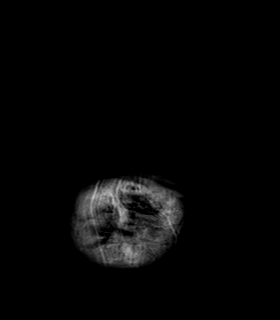

[Series 14: T1 fat-sat · axial · 3.0mm · 1.56mm/px · z∈[-106,+106]mm · 15 of 360 slices shown (2 of 2)]
[im 1/360]
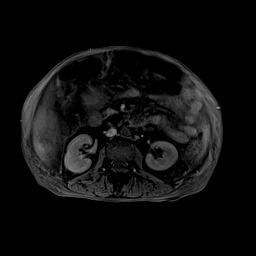
[im 26/360]
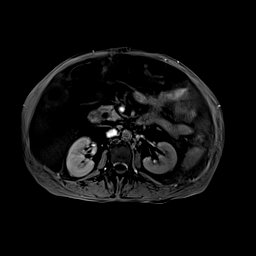
[im 52/360]
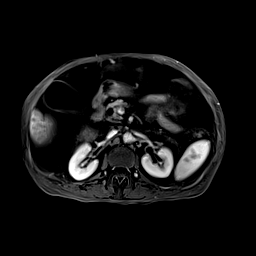
[im 77/360]
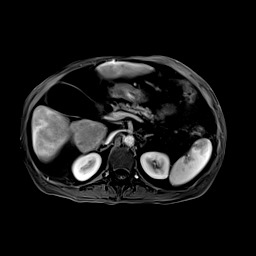
[im 103/360]
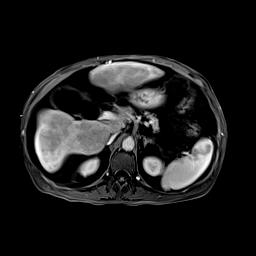
[im 129/360]
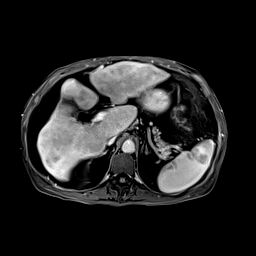
[im 154/360]
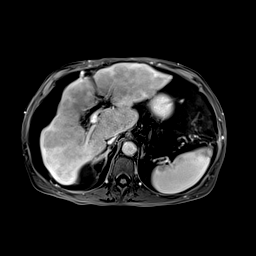
[im 180/360]
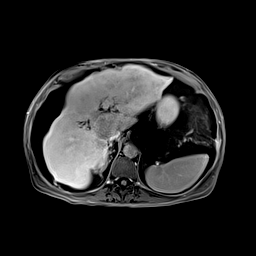
[im 206/360]
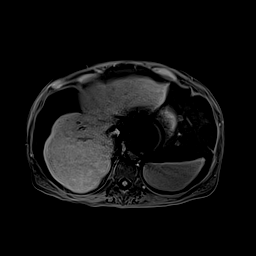
[im 231/360]
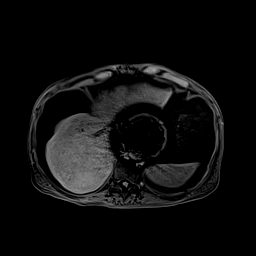
[im 257/360]
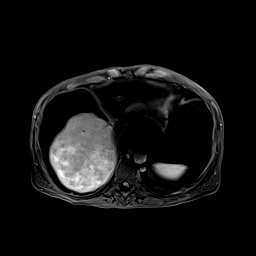
[im 283/360]
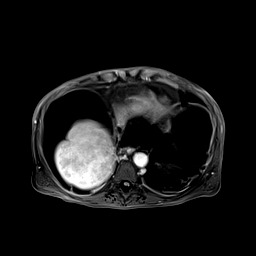
[im 308/360]
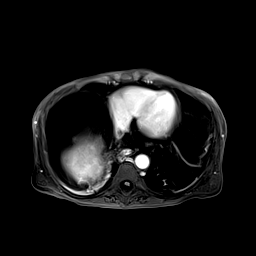
[im 334/360]
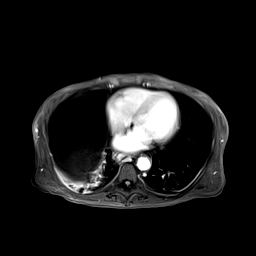
[im 360/360]
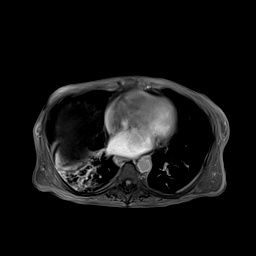

[Series 1017: results sub_t1 fatsat · axial · 3.0mm · 1.56mm/px · z∈[-106,+106]mm · 12 of 288 slices shown]
[im 1/288]
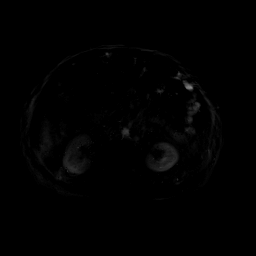
[im 27/288]
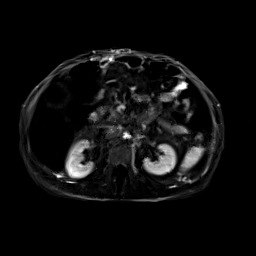
[im 53/288]
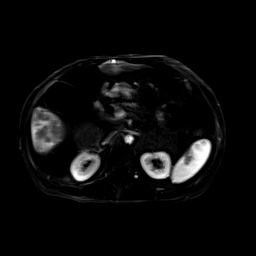
[im 79/288]
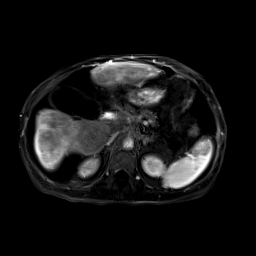
[im 105/288]
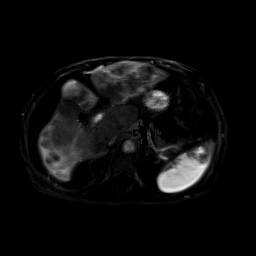
[im 131/288]
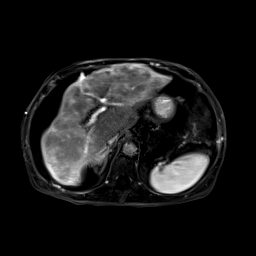
[im 157/288]
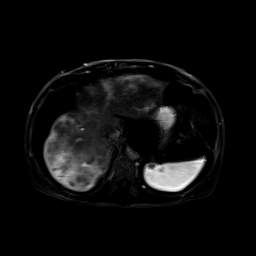
[im 183/288]
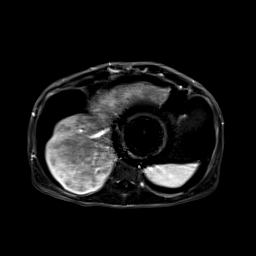
[im 209/288]
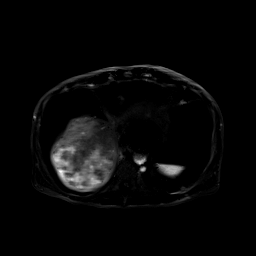
[im 235/288]
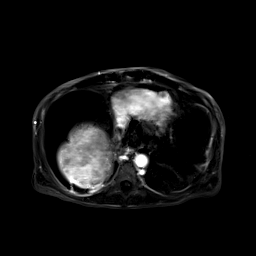
[im 261/288]
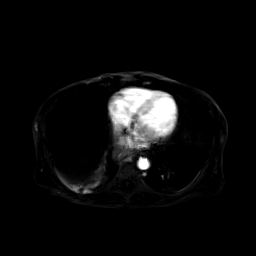
[im 288/288]
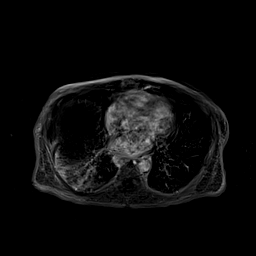

[48 of 48 positions shown; findings below may reference images not displayed]

FINDINGS: Lower chest: No acute findings.

Hepatobiliary: The liver is cirrhotic with hypertrophy of the
caudate lobe and lateral segment of left lobe. Contour the liver is
lobular. Diffusely heterogeneous enhancement is identified
throughout both lobes. No focal areas of masslike enhancement
identified. On the delayed images there are no abnormal areas of
washout specific for HCC. Moderate distension of the gallbladder. No
gallstones, gallbladder wall thickening. There is increase caliber
of the CBD which measures 1.1 cm in maximum thickness. No
choledocholithiasis or obstructing mass.

Pancreas: No mass, inflammatory changes, or other parenchymal
abnormality identified.

Spleen:  The spleen is enlarged measuring 13.7 cm in length.

Adrenals/Urinary Tract: Normal appearance of the adrenal glands. No
kidney mass or hydronephrosis identified.

Stomach/Bowel: Visualized portions within the abdomen are
unremarkable.

Vascular/Lymphatic: Aortic atherosclerosis without aneurysm. Small
esophageal varices. Left upper quadrant varices also noted. No
adenopathy.

Other:  There is a large volume of ascites within the abdomen.

Musculoskeletal: No suspicious bone lesions identified.
IMPRESSION: 1. Morphologic features of liver compatible with cirrhosis. No
specific findings identified to suggest hepatoma.
2. Stigmata of portal venous hypertension including splenomegaly,
esophageal varices and ascites.

## 2019-07-15 MED ORDER — SODIUM CHLORIDE 0.9% IV SOLUTION
Freq: Once | INTRAVENOUS | Status: AC
  Administered 2019-07-15: 10:00:00 via INTRAVENOUS
  Filled 2019-07-15: qty 250

## 2019-07-15 MED ORDER — POTASSIUM CHLORIDE 20 MEQ PO PACK
40.0000 meq | PACK | Freq: Once | ORAL | Status: AC
  Administered 2019-07-15: 40 meq via ORAL
  Filled 2019-07-15: qty 2

## 2019-07-15 MED ORDER — OCTREOTIDE LOAD VIA INFUSION
50.0000 ug | Freq: Once | INTRAVENOUS | Status: DC
  Filled 2019-07-15: qty 25

## 2019-07-15 MED ORDER — SODIUM CHLORIDE 0.9 % IV SOLN
50.0000 ug/h | INTRAVENOUS | Status: DC
  Filled 2019-07-15 (×4): qty 1

## 2019-07-15 MED ORDER — SODIUM CHLORIDE 0.9 % IV BOLUS
500.0000 mL | Freq: Once | INTRAVENOUS | Status: AC
  Administered 2019-07-15: 500 mL via INTRAVENOUS

## 2019-07-15 MED ORDER — ALUM & MAG HYDROXIDE-SIMETH 200-200-20 MG/5ML PO SUSP
30.0000 mL | ORAL | Status: DC | PRN
  Administered 2019-07-15: 30 mL via ORAL
  Filled 2019-07-15: qty 30

## 2019-07-15 MED ORDER — SODIUM CHLORIDE 0.9 % IV SOLN
INTRAVENOUS | Status: DC | PRN
  Administered 2019-07-15: 250 mL via INTRAVENOUS

## 2019-07-15 MED ORDER — MAGNESIUM SULFATE 2 GM/50ML IV SOLN
2.0000 g | Freq: Once | INTRAVENOUS | Status: AC
  Administered 2019-07-15: 2 g via INTRAVENOUS
  Filled 2019-07-15: qty 50

## 2019-07-15 MED ORDER — OCTREOTIDE LOAD VIA INFUSION
50.0000 ug | Freq: Once | INTRAVENOUS | Status: AC
  Administered 2019-07-15: 50 ug via INTRAVENOUS
  Filled 2019-07-15: qty 25

## 2019-07-15 MED ORDER — SODIUM CHLORIDE 0.9 % IV SOLN
50.0000 ug/h | INTRAVENOUS | Status: DC
  Administered 2019-07-15: 50 ug/h via INTRAVENOUS
  Filled 2019-07-15 (×3): qty 5

## 2019-07-15 MED ORDER — GADOBUTROL 1 MMOL/ML IV SOLN
5.0000 mL | Freq: Once | INTRAVENOUS | Status: AC | PRN
  Administered 2019-07-15: 5 mL via INTRAVENOUS

## 2019-07-15 MED ORDER — SODIUM CHLORIDE 0.9 % IV BOLUS
1000.0000 mL | Freq: Once | INTRAVENOUS | Status: AC
  Administered 2019-07-15: 1000 mL via INTRAVENOUS

## 2019-07-15 MED ORDER — OXYCODONE HCL 5 MG PO TABS
5.0000 mg | ORAL_TABLET | Freq: Three times a day (TID) | ORAL | Status: DC | PRN
  Administered 2019-07-15 – 2019-07-18 (×9): 5 mg via ORAL
  Filled 2019-07-15 (×10): qty 1

## 2019-07-15 MED ORDER — VANCOMYCIN 50 MG/ML ORAL SOLUTION
125.0000 mg | Freq: Four times a day (QID) | ORAL | Status: DC
  Administered 2019-07-15 – 2019-07-18 (×12): 125 mg via ORAL
  Filled 2019-07-15 (×14): qty 2.5

## 2019-07-15 NOTE — Progress Notes (Signed)
Order received from Dr Bonna Gains for a clear liquid diet

## 2019-07-15 NOTE — TOC Initial Note (Signed)
Transition of Care [facility]) - Initial/Assessment Note    Patient Details  Name: Anita Reed MRN: [id number] Date of Birth: 1960-09-24  Transition of Care [facility] CM/SW Contact:    Latanya Maudlin, RN Phone Number: 07/15/2019, 9:36 AM  Clinical Narrative:  TOC consulted to complete high risk readmission assessment. Patient is from home with her spouse. Patient is active with [facility] for PT,OT and SW. Patient uses no DME at home and reports she has grab bars in the bathroom and that is all she needs. Spouse transports to appointments, etc. PCP is Raelyn Ensign. Uses [facility] and obtains medications without issues.                 Expected Discharge Plan: [place] Barriers to Discharge: Continued Medical Work up   Patient Goals and CMS Choice     Choice offered to / list presented to : Patient  Expected Discharge Plan and Services Expected Discharge Plan: [place]   Discharge Planning Services: CM Consult Post Acute Care Choice: Resumption of Svcs/PTA Provider, Home Health Living arrangements for the past 2 months: Single Family Home                                      Prior Living Arrangements/Services Living arrangements for the past 2 months: Single Family Home Lives with:: Spouse                   Activities of Daily Living      Permission Sought/Granted                  Emotional Assessment              Admission diagnosis:  Lower GI bleed [K92.2] Patient Active Problem List   Diagnosis Date Noted  . Goals of care, counseling/discussion   . Palliative care encounter   . Muscle cramping 06/22/2019  . Alcohol abuse 06/22/2019  . Sepsis ([place]) 06/22/2019  . GIB (gastrointestinal bleeding) 06/01/2019  . GI bleeding 06/01/2019  . History of lymphoma 02/26/2019  . Atrial flutter ([place]) 02/07/2019  . Alcoholic cirrhosis of liver with ascites ([place]) 02/07/2019  . Persistent atrial  fibrillation ([place]) 02/07/2019  . Anemia 02/07/2019  . Anorexia 02/07/2019  . GI bleed 01/28/2019  . Ascites 01/18/2019  . Cancer [facility])    PCP:  Hubbard Hartshorn, FNP Pharmacy:   [facility] [address] [place] [address] [place] [address] Phone: [phone] Fax: [phone]     Social Determinants of Health (SDOH) Interventions    Readmission Risk Interventions Readmission Risk Prevention Plan 07/15/2019  Transportation Screening Complete  PCP or Specialist Appt within 3-5 Days Complete  HRI or [place] Complete  Social Work Consult for [place] Planning/Counseling Patient refused  Palliative Care Screening Complete  Medication Review Press photographer) Complete  Some recent data might be hidden

## 2019-07-15 NOTE — ED Notes (Addendum)
After bolus, pressure noted to have remained at previous level. Benjamine Mola NP notified. CBC results reviewed, 1 unit PRBC's ordered.

## 2019-07-15 NOTE — ED Notes (Signed)
Elizabeth NP notified of pt VS and infusion status

## 2019-07-15 NOTE — Progress Notes (Signed)
Order received from Dr Mal Misty for a full liquid diet

## 2019-07-15 NOTE — ED Notes (Addendum)
First attemp to call reports, reports given to receiving nurse. 1st unit of PRBC completed. Vss patient going to unit with normal saline/potassium drip. Receiving nurse to start second unit of PRBC's.

## 2019-07-15 NOTE — ED Notes (Signed)
Attempt venipuncture x2 for h+h and am labs, unsuccessfully, blood appears as if it will hemolize. Lab here to perform venipuncture.

## 2019-07-15 NOTE — Consult Note (Addendum)
Vonda Antigua, MD [address], [place], [place], [place], [address] [address] [place], [place], [place], [address] Phone: [phone]  Fax: [phone]  Consultation  Referring Provider:     Dr. Mal Misty Primary Care Physician:  Hubbard Hartshorn, FNP Reason for Consultation:     Abdominal pain, rectal bleeding  Date of Admission:  07/14/2019 Date of Consultation:  07/15/2019         HPI:   Anita Reed is a 58 y.o. female with history of alcoholic liver cirrhosis with ascites, abstinent at this time, chronic hep C status post treatment, with recent history of rectal bleeding due to C. difficile colitis, presents with abdominal pain and rectal bleeding.  Patient reports having 1 day of bright red blood per rectum.the bleeding occurred 2 days ago, 2-3 episodes and has resolved since then.  She has not had any further bleeding in the last 48 hours.  Reports having brown loose stools yesterday.  No fever or chills.  Also had abdominal pain, cramping, dull, 5/10, nonradiating associated with this.  Patient states she took her antibiotics when she went home after her last hospitalization, however, is very vague about if she missed any doses.  positive C. difficile PCR on 06/08/2019, and pseudomembranes as per colonoscopy done by Dr. Alice Reichert on 06/03/2019.  There were also pseudomembranes seen during this procedure, and patient was treated for C. difficile colitis.  Biopsies from the procedure showed active colitis, and did not report C. difficile or ischemia.  Underwent a CTA which did not show any evidence of mesenteric ischemia.  Was also found to have SBP and was started on ciprofloxacin.  Also had EGD 2 days prior to that that showed grade 2 nonbleeding esophageal varices, portal hypertensive gastropathy.  Patient also had an EGD earlier this year in June 2020, when she presented with symptoms of upper GI bleed and was found to have dual Foy lesion that was clipped.  Portal hypertensive  gastropathy was also seen.  Large varices were reported in the esophagus, with no red wale signs or stigmata of recent bleed.  Also has been undergoing paracentesis as needed.  Has been abstinent of alcohol since May or June of this year.  Last paracentesis was on November 12, 3 days ago with 5.7 liters of fluid removed.  CT abdomen on admission reported changes of cirrhosis with splenomegaly and large volume ascites.  5.5 cm exophytic appearing masslike area in the right hepatic lobe.  Follow-up MRI reported features of cirrhosis with no specific findings identified to suggest hepatoma.  Stigmata of portal venous hypertension were reported.  Patient had a hemoglobin of 9.1 on admission, with her baseline ranging from 8-10.  She was given blood transfusion and hemoglobin is now 12.3.  She has chronically elevated liver enzymes  Past Medical History:  Diagnosis Date   Anemia    Cancer ([place])    Cirrhosis ([place])    Hypertension     Past Surgical History:  Procedure Laterality Date   ABDOMINAL HYSTERECTOMY     APPENDECTOMY     COLONOSCOPY N/A 06/03/2019   Procedure: COLONOSCOPY;  Surgeon: Toledo, Benay Pike, MD;  Location: [facility] ENDOSCOPY;  Service: Gastroenterology;  Laterality: N/A;   ESOPHAGOGASTRODUODENOSCOPY N/A 01/29/2019   Procedure: ESOPHAGOGASTRODUODENOSCOPY (EGD);  Surgeon: [name], MD;  Location: [name] ENDOSCOPY;  Service: Gastroenterology;  Laterality: N/A;   ESOPHAGOGASTRODUODENOSCOPY N/A 06/01/2019   Procedure: ESOPHAGOGASTRODUODENOSCOPY (EGD);  Surgeon: Toledo, Benay Pike, MD;  Location: [facility] ENDOSCOPY;  Service:  Gastroenterology;  Laterality: N/A;   TONSILLECTOMY      Prior to Admission medications   Medication Sig Start Date End Date Taking? Authorizing Provider  baclofen (LIORESAL) 10 MG tablet Take 1 tablet (10 mg total) by mouth 3 (three) times daily. Muscle Relaxer 06/22/19  Yes Hubbard Hartshorn, FNP  digoxin (LANOXIN) 0.125 MG tablet Take 1 tablet (0.125  mg total) by mouth daily. 06/29/19  Yes Max Sane, MD  FLUoxetine (PROZAC) 10 MG capsule Take 1 capsule (10 mg total) by mouth daily. 06/29/19  Yes Max Sane, MD  furosemide (LASIX) 40 MG tablet Take 0.5 tablets (20 mg total) by mouth daily. 06/28/19  Yes Max Sane, MD  gabapentin (NEURONTIN) 100 MG capsule Take 1 capsule (100 mg total) by mouth 2 (two) times daily. 06/28/19  Yes Max Sane, MD  metoprolol succinate (TOPROL-XL) 25 MG 24 hr tablet Take 1 tablet (25 mg total) by mouth daily. 06/29/19  Yes Max Sane, MD  midodrine (PROAMATINE) 5 MG tablet Take 1 tablet (5 mg total) by mouth 3 (three) times daily with meals. 06/29/19  Yes Max Sane, MD  pantoprazole (PROTONIX) 40 MG tablet Take 1 tablet (40 mg total) by mouth 2 (two) times daily. 01/31/19 01/31/20 Yes Pyreddy, Reatha Harps, MD  spironolactone (ALDACTONE) 50 MG tablet Take 1 tablet (50 mg total) by mouth daily. 06/29/19  Yes Max Sane, MD  tiZANidine (ZANAFLEX) 4 MG capsule Take 1 capsule (4 mg total) by mouth at bedtime. Patient taking differently: Take 4 mg by mouth 2 (two) times daily.  06/29/19 07/29/19 Yes Vanga, Tally Due, MD    Family History  Problem Relation Age of Onset   Cancer Mother    Cancer Father      Social History   Tobacco Use   Smoking status: Current Every Day Smoker   Smokeless tobacco: Never Used  Substance Use Topics   Alcohol use: Not Currently   Drug use: Never    Allergies as of 07/14/2019   (No Known Allergies)    Review of Systems:    All systems reviewed and negative except where noted in HPI.   Physical Exam:  Vital signs in last 24 hours: Vitals:   07/15/19 0647 07/15/19 0742 07/15/19 0815 07/15/19 0842  BP: (!) 92/59 (!) 90/59 (!) 96/54 (!) 90/54  Pulse: 62 73 77 79  Resp:  16 18   Temp:  98.6 F (37 C) 98.6 F (37 C) 98 F (36.7 C)  TempSrc:  Oral  Oral  SpO2:  99% 100% 100%  Weight:      Height:         General:   Pleasant, cooperative in NAD Head:   Normocephalic and atraumatic. Eyes:   No icterus.   Conjunctiva pink. PERRLA. Ears:  Normal auditory acuity. Neck:  Supple; no masses or thyroidomegaly Lungs: Respirations even and unlabored. Lungs clear to auscultation bilaterally.   No wheezes, crackles, or rhonchi.  Abdomen:  Soft, mildly distended, mildly tender. Normal bowel sounds. No appreciable masses or hepatomegaly.  No rebound or guarding.  Neurologic:  Alert and oriented x3;  grossly normal neurologically. Skin:  Intact without significant lesions or rashes. Cervical Nodes:  No significant cervical adenopathy. Psych:  Alert and cooperative. Normal affect.  LAB RESULTS: Recent Labs    07/14/19 1603 07/15/19 0433  WBC 16.8* 18.8*  HGB 12.7 9.1*  HCT 39.5 28.1*  PLT 279 187   BMET Recent Labs    07/14/19 1754 07/15/19 0433  NA 138  136  K 2.6* 3.1*  CL 102 103  CO2 22 25  GLUCOSE 96 98  BUN 8 8  CREATININE 0.58 0.54  CALCIUM 7.8* 7.7*   LFT Recent Labs    07/14/19 1754  PROT 5.8*   5.8*  ALBUMIN 3.3*   3.3*  AST 57*   57*  ALT 20   20  ALKPHOS 354*   361*  BILITOT 2.1*   2.1*  BILIDIR 1.0*  IBILI 1.1*   PT/INR Recent Labs    07/14/19 1754  LABPROT 14.9  INR 1.2    STUDIES: Ct Abdomen Pelvis W Contrast  Result Date: 07/14/2019 CLINICAL DATA:  Abdominal pain, bloody stools EXAM: CT ABDOMEN AND PELVIS WITH CONTRAST TECHNIQUE: Multidetector CT imaging of the abdomen and pelvis was performed using the standard protocol following bolus administration of intravenous contrast. CONTRAST:  167mL OMNIPAQUE IOHEXOL 300 MG/ML  SOLN COMPARISON:  06/23/2019 FINDINGS: Lower chest: No acute abnormality Hepatobiliary: Nodular contours of the liver compatible with cirrhosis. Heterogeneous enhancement. Concern for possible mass in the inferior right hepatic lobe versus regenerating nodule measuring 5.5 cm. No biliary ductal dilatation. Gallbladder is distended. Pancreas: No focal abnormality or ductal dilatation.  Spleen: Splenomegaly with a craniocaudal length of 14.4 cm. Adrenals/Urinary Tract: No adrenal abnormality. No focal renal abnormality. No stones or hydronephrosis. Urinary bladder is unremarkable. Stomach/Bowel: Stomach, large and small bowel grossly unremarkable. Vascular/Lymphatic: Aortic atherosclerosis. No enlarged abdominal or pelvic lymph nodes. Reproductive: Prior hysterectomy.  No adnexal masses. Other: Large volume ascites in the abdomen and pelvis. Musculoskeletal: No acute bony abnormality. IMPRESSION: Changes of cirrhosis with splenomegaly and large volume ascites. 5.5 cm exophytic appearing masslike area off the inferior medial right hepatic lobe. This could reflect regenerating nodule, but recommend further evaluation with elective MRI of the liver to exclude hepatocellular carcinoma. Aortic atherosclerosis. Electronically Signed   By: Rolm Baptise M.D.   On: 07/14/2019 19:35   Mr Liver W [id number] Contrast  Result Date: 07/15/2019 CLINICAL DATA:  Abdominal pain.  Cirrhosis.  Evaluate liver lesion. EXAM: MRI ABDOMEN WITHOUT AND WITH CONTRAST TECHNIQUE: Multiplanar multisequence MR imaging of the abdomen was performed both before and after the administration of intravenous contrast. CONTRAST:  41mL GADAVIST GADOBUTROL 1 MMOL/ML IV SOLN COMPARISON:  CT AP 07/14/2019 FINDINGS: Lower chest: No acute findings. Hepatobiliary: The liver is cirrhotic with hypertrophy of the caudate lobe and lateral segment of left lobe. Contour the liver is lobular. Diffusely heterogeneous enhancement is identified throughout both lobes. No focal areas of masslike enhancement identified. On the delayed images there are no abnormal areas of washout specific for HCC. Moderate distension of the gallbladder. No gallstones, gallbladder wall thickening. There is increase caliber of the CBD which measures 1.1 cm in maximum thickness. No choledocholithiasis or obstructing mass. Pancreas: No mass, inflammatory changes, or other  parenchymal abnormality identified. Spleen:  The spleen is enlarged measuring 13.7 cm in length. Adrenals/Urinary Tract: Normal appearance of the adrenal glands. No kidney mass or hydronephrosis identified. Stomach/Bowel: Visualized portions within the abdomen are unremarkable. Vascular/Lymphatic: Aortic atherosclerosis without aneurysm. Small esophageal varices. Left upper quadrant varices also noted. No adenopathy. Other:  There is a large volume of ascites within the abdomen. Musculoskeletal: No suspicious bone lesions identified. IMPRESSION: 1. Morphologic features of liver compatible with cirrhosis. No specific findings identified to suggest hepatoma. 2. Stigmata of portal venous hypertension including splenomegaly, esophageal varices and ascites. Electronically Signed   By: Kerby Moors M.D.   On: 07/15/2019 04:16  Impression / Plan:   Kalolaine Hiott is a 58 y.o. y/o female with history of alcoholic liver cirrhosis, currently abstinent, history of hep C, status post treatment, recent history of rectal bleeding from C. difficile colitis based on positive C. difficile stool PCR, and pseudomembranes during a colonoscopy in October 2020, presents with abdominal pain and rectal bleeding  Rectal bleeding occurred on Friday, and has not reoccurred since then.  Therefore, no bleeding for the last 48 hours at this point.  She did have 1 week of diarrhea prior to the episodes of bleeding.  Given recent C. difficile, and patient stating that she thinks she complete her antibiotics, but cannot be sure, makes me suspicious for this being C. difficile again  Please obtain repeat C. difficile to rule out ongoing infection and determine need for repeat therapy.  Also obtain GI panel to rule out other sources of infection.  Given her elevated white count, would recommend empiric treatment with vancomycin 125 mg p.o. every 6 hours until C. difficile testing returns  Okay to continue Protonix twice daily     We will discontinue octreotide, since no evidence of upper GI bleed and recent upper endoscopies have not shown bleeding varices  Patient is on ceftriaxone which is appropriate given her previous history of SBP, and now with GI bleed and history of cirrhosis and ascites  Patient should be continued on gram-negative coverage antibiotic on discharge as well, as she qualifies for SBP prophylaxis given her previous history of SBP  Would recommend paracentesis given abdominal distention, and CT reporting large ascites and sending fluid for testing to rule out active infection  Liver enzymes are elevated but at baseline, with no acute elevation  Continue serial CBCs and transfuse PRN Avoid NSAIDs Maintain 2 large-bore IV lines Please page GI with any acute hemodynamic changes, or signs of active GI bleeding   Thank you for involving me in the care of this patient.      LOS: 1 day   Virgel Manifold, MD  07/15/2019, 10:03 AM

## 2019-07-15 NOTE — ED Notes (Signed)
ED TO INPATIENT HANDOFF REPORT  ED Nurse Name and Phone #:    S Name/Age/Gender Anita Reed 58 y.o. female Room/Bed: ED30A/ED30A  Code Status   Code Status: DNR  Home/SNF/Other Home Patient oriented to: self, place, time and situation Is this baseline? Yes   Triage Complete: Triage complete  Chief Complaint Bloody Stool  Triage Note Patient presents to the ED from home.  Patient reports abdominal pain and 4 episodes of frank blood/stools from her rectum today.  Patient states she recently had a similar issue and had to get multiple blood transfusions.  Patient states she has had abdominal pain x 2 weeks.  Patient reports paracentesis on Thursday.     Allergies No Known Allergies  Level of Care/Admitting Diagnosis ED Disposition    ED Disposition Condition [facility] Area: [place] [[id number]]  Level of Care: Med-Surg [16]  Covid Evaluation: Asymptomatic Screening Protocol (No Symptoms)  Diagnosis: GI bleeding [id number]  Admitting Physician: Christel Mormon [id number]  Attending Physician: Christel Mormon [id number]  Estimated length of stay: 3 - 4 days  Certification:: I certify this patient will need inpatient services for at least 2 midnights  PT Class (Do Not Modify): Inpatient [101]  PT Acc Code (Do Not Modify): Private [1]       B Medical/Surgery History Past Medical History:  Diagnosis Date  . Anemia   . Cancer ([place])   . Cirrhosis ([place])   . Hypertension    Past Surgical History:  Procedure Laterality Date  . ABDOMINAL HYSTERECTOMY    . APPENDECTOMY    . COLONOSCOPY N/A 06/03/2019   Procedure: COLONOSCOPY;  Surgeon: Toledo, Benay Pike, MD;  Location: [facility] ENDOSCOPY;  Service: Gastroenterology;  Laterality: N/A;  . ESOPHAGOGASTRODUODENOSCOPY N/A 01/29/2019   Procedure: ESOPHAGOGASTRODUODENOSCOPY (EGD);  Surgeon: [name], MD;  [facility]: [facility] ENDOSCOPY;  Service: Gastroenterology;  Laterality: N/A;  .  ESOPHAGOGASTRODUODENOSCOPY N/A 06/01/2019   Procedure: ESOPHAGOGASTRODUODENOSCOPY (EGD);  Surgeon: Toledo, Benay Pike, MD;  Location: [facility] ENDOSCOPY;  Service: Gastroenterology;  Laterality: N/A;  . TONSILLECTOMY       A IV Location/Drains/Wounds Patient Lines/Drains/Airways Status   Active Line/Drains/Airways    Name:   Placement date:   Placement time:   Site:   Days:   Peripheral IV 06/22/19 Right Arm   06/22/19    1615    Arm   23   Peripheral IV 07/12/19 Anterior;Left Forearm   07/12/19    1604    Forearm   3   Peripheral IV 07/14/19 Left Forearm   07/14/19    1753    Forearm   1   Peripheral IV 07/15/19 Right Forearm   07/15/19    0420    Forearm   less than 1   Incision (Closed) 06/07/19 Flank Right   06/07/19    1105     38   Incision (Closed) 06/24/19 Abdomen Lower;Medial   06/24/19    1610     21   Incision (Closed) 06/26/19 Abdomen Anterior;Right   06/26/19    1718     19          Intake/Output Last 24 hours  Intake/Output Summary (Last 24 hours) at 07/15/2019 0736 Last data filed at 07/15/2019 0526 Gross per 24 hour  Intake 2197.04 ml  Output -  Net 2197.04 ml    Labs/Imaging Results for orders placed or performed during the hospital encounter of 07/14/19 (from the past 48 hour(s))  CBC  Status: Abnormal   Collection Time: 07/14/19  4:03 PM  Result Value Ref Range   WBC 16.8 (H) 4.0 - 10.5 K/uL   RBC 5.22 (H) 3.87 - 5.11 MIL/uL   Hemoglobin 12.7 12.0 - 15.0 g/dL   HCT 39.5 36.0 - 46.0 %   MCV 75.7 (L) 80.0 - 100.0 fL   MCH 24.3 (L) 26.0 - 34.0 pg   MCHC 32.2 30.0 - 36.0 g/dL   RDW 22.1 (H) 11.5 - 15.5 %   Platelets 279 150 - 400 K/uL   nRBC 0.0 0.0 - 0.2 %    Comment: Performed at [facility], [address] [place], [place] [address]  Type and screen [place]     Status: None (Preliminary result)   Collection Time: 07/14/19  4:04 PM  Result Value Ref Range   ABO/RH(D) O POS    Antibody Screen NEG    Sample  Expiration 07/17/2019,2359    Unit Number [id number]    Blood Component Type RED CELLS,LR    Unit division 00    Status of Unit ISSUED    Transfusion Status OK TO TRANSFUSE    Crossmatch Result      Compatible Performed at [facility], [address], [place], [place] [address]    Unit Number [id number]    Blood Component Type RED CELLS,LR    Unit division 00    Status of Unit ALLOCATED    Transfusion Status OK TO TRANSFUSE    Crossmatch Result Compatible   Comprehensive metabolic panel     Status: Abnormal   Collection Time: 07/14/19  5:54 PM  Result Value Ref Range   Sodium 138 135 - 145 mmol/L   Potassium 2.6 (LL) 3.5 - 5.1 mmol/L    Comment: HEMOLYSIS AT THIS LEVEL MAY AFFECT RESULT CRITICAL RESULT CALLED TO, READ BACK BY AND VERIFIED WITH KIM MAIN @1836  07/14/19 MJU    Chloride 102 98 - 111 mmol/L   CO2 22 22 - 32 mmol/L   Glucose, Bld 96 70 - 99 mg/dL   BUN 8 6 - 20 mg/dL   Creatinine, Ser 0.58 0.44 - 1.00 mg/dL   Calcium 7.8 (L) 8.9 - 10.3 mg/dL   Total Protein 5.8 (L) 6.5 - 8.1 g/dL   Albumin 3.3 (L) 3.5 - 5.0 g/dL   AST 57 (H) 15 - 41 U/L   ALT 20 0 - 44 U/L   Alkaline Phosphatase 361 (H) 38 - 126 U/L   Total Bilirubin 2.1 (H) 0.3 - 1.2 mg/dL   GFR calc non Af Amer >60 >60 mL/min   GFR calc Af Amer >60 >60 mL/min   Anion gap 14 5 - 15    Comment: Performed at [facility], [address] [place], [place] [address]  Protime-INR     Status: None   Collection Time: 07/14/19  5:54 PM  Result Value Ref Range   Prothrombin Time 14.9 11.4 - 15.2 seconds   INR 1.2 0.8 - 1.2    Comment: (NOTE) INR goal varies based on device and disease states. Performed at [facility], [address] [place], [place] [address]   APTT     Status: None   Collection Time: 07/14/19  5:54 PM  Result Value Ref Range   aPTT 30 24 - 36 seconds    Comment: Performed at [facility], [address] [place],  [address]  Hepatic  function panel     Status: Abnormal  Collection Time: 07/14/19  5:54 PM  Result Value Ref Range   Total Protein 5.8 (L) 6.5 - 8.1 g/dL   Albumin 3.3 (L) 3.5 - 5.0 g/dL   AST 57 (H) 15 - 41 U/L   ALT 20 0 - 44 U/L   Alkaline Phosphatase 354 (H) 38 - 126 U/L   Total Bilirubin 2.1 (H) 0.3 - 1.2 mg/dL   Bilirubin, Direct 1.0 (H) 0.0 - 0.2 mg/dL   Indirect Bilirubin 1.1 (H) 0.3 - 0.9 mg/dL    Comment: Performed at [facility], [address] [place], [place] [address]  Lipase, blood     Status: None   Collection Time: 07/14/19  5:54 PM  Result Value Ref Range   Lipase 37 11 - 51 U/L    Comment: Performed at [facility], [address] [place], [place] [address]  Urinalysis, Complete w Microscopic     Status: Abnormal   Collection Time: 07/14/19  9:10 PM  Result Value Ref Range   Color, Urine YELLOW (A) YELLOW   APPearance CLEAR (A) CLEAR   Specific Gravity, Urine >1.046 (H) 1.005 - 1.030   pH 6.0 5.0 - 8.0   Glucose, UA NEGATIVE NEGATIVE mg/dL   Hgb urine dipstick NEGATIVE NEGATIVE   Bilirubin Urine NEGATIVE NEGATIVE   Ketones, ur NEGATIVE NEGATIVE mg/dL   Protein, ur NEGATIVE NEGATIVE mg/dL   Nitrite NEGATIVE NEGATIVE   Leukocytes,Ua NEGATIVE NEGATIVE   RBC / HPF 6-10 0 - 5 RBC/hpf   WBC, UA 0-5 0 - 5 WBC/hpf   Bacteria, UA NONE SEEN NONE SEEN   Squamous Epithelial / LPF 11-20 0 - 5    Comment: Performed at [facility], [address] [place], [place] [address]  SARS CORONAVIRUS 2 (TAT 6-24 HRS) Nasopharyngeal Urine, Clean Catch     Status: None   Collection Time: 07/14/19  9:10 PM   Specimen: Urine, Clean Catch; Nasopharyngeal  Result Value Ref Range   SARS Coronavirus 2 NEGATIVE NEGATIVE    Comment: (NOTE) SARS-CoV-2 target nucleic acids are NOT DETECTED. The SARS-CoV-2 RNA is generally detectable in upper and lower respiratory specimens during the acute phase of infection. Negative results do not preclude SARS-CoV-2 infection, do not  rule out co-infections with other pathogens, and should not be used as the sole basis for treatment or other patient management decisions. Negative results must be combined with clinical observations, patient history, and epidemiological information. The expected result is Negative. Fact Sheet for Patients: SugarRoll.be Fact Sheet for Healthcare Providers: https://www.woods-mathews.com/ This test is not yet approved or cleared by the [place] FDA and  has been authorized for detection and/or diagnosis of SARS-CoV-2 by FDA under an Emergency Use Authorization (EUA). This EUA will remain  in effect (meaning this test can be used) for the duration of the COVID-19 declaration under Section 56 4(b)(1) of the Act, 21 U.S.C. section 360bbb-3(b)(1), unless the authorization is terminated or revoked sooner. Performed at [facility], [place] [address] [place], [place] [id number]   Basic metabolic panel     Status: Abnormal   Collection Time: 07/15/19  4:33 AM  Result Value Ref Range   Sodium 136 135 - 145 mmol/L   Potassium 3.1 (L) 3.5 - 5.1 mmol/L   Chloride 103 98 - 111 mmol/L   CO2 25 22 - 32 mmol/L   Glucose, Bld 98 70 - 99 mg/dL   BUN 8 6 - 20 mg/dL   Creatinine, Ser 0.54 0.44 -  1.00 mg/dL   Calcium 7.7 (L) 8.9 - 10.3 mg/dL   GFR calc non Af Amer >60 >60 mL/min   GFR calc Af Amer >60 >60 mL/min   Anion gap 8 5 - 15    Comment: Performed at [facility], [address] [place], [place] [address]  CBC     Status: Abnormal   Collection Time: 07/15/19  4:33 AM  Result Value Ref Range   WBC 18.8 (H) 4.0 - 10.5 K/uL   RBC 3.62 (L) 3.87 - 5.11 MIL/uL   Hemoglobin 9.1 (L) 12.0 - 15.0 g/dL   HCT 28.1 (L) 36.0 - 46.0 %   MCV 77.6 (L) 80.0 - 100.0 fL   MCH 25.1 (L) 26.0 - 34.0 pg   MCHC 32.4 30.0 - 36.0 g/dL   RDW 21.5 (H) 11.5 - 15.5 %   Platelets 187 150 - 400 K/uL   nRBC 0.0 0.0 - 0.2 %    Comment: Performed at [facility], [address] [place], [place] [address]  Magnesium     Status: Abnormal   Collection Time: 07/15/19  4:33 AM  Result Value Ref Range   Magnesium 1.4 (L) 1.7 - 2.4 mg/dL    Comment: Performed at [facility], [address] [place], [place] [address]  Prepare RBC     Status: None   Collection Time: 07/15/19  5:20 AM  Result Value Ref Range   Order Confirmation      ORDER PROCESSED BY BLOOD BANK Performed at [facility], [address], [place], [place] [address]    Ct Abdomen Pelvis W Contrast  Result Date: 07/14/2019 CLINICAL DATA:  Abdominal pain, bloody stools EXAM: CT ABDOMEN AND PELVIS WITH CONTRAST TECHNIQUE: Multidetector CT imaging of the abdomen and pelvis was performed using the standard protocol following bolus administration of intravenous contrast. CONTRAST:  123mL OMNIPAQUE IOHEXOL 300 MG/ML  SOLN COMPARISON:  06/23/2019 FINDINGS: Lower chest: No acute abnormality Hepatobiliary: Nodular contours of the liver compatible with cirrhosis. Heterogeneous enhancement. Concern for possible mass in the inferior right hepatic lobe versus regenerating nodule measuring 5.5 cm. No biliary ductal dilatation. Gallbladder is distended. Pancreas: No focal abnormality or ductal dilatation. Spleen: Splenomegaly with a craniocaudal length of 14.4 cm. Adrenals/Urinary Tract: No adrenal abnormality. No focal renal abnormality. No stones or hydronephrosis. Urinary bladder is unremarkable. Stomach/Bowel: Stomach, large and small bowel grossly unremarkable. Vascular/Lymphatic: Aortic atherosclerosis. No enlarged abdominal or pelvic lymph nodes. Reproductive: Prior hysterectomy.  No adnexal masses. Other: Large volume ascites in the abdomen and pelvis. Musculoskeletal: No acute bony abnormality. IMPRESSION: Changes of cirrhosis with splenomegaly and large volume ascites. 5.5 cm exophytic appearing masslike area off the inferior medial right hepatic lobe. This could reflect  regenerating nodule, but recommend further evaluation with elective MRI of the liver to exclude hepatocellular carcinoma. Aortic atherosclerosis. Electronically Signed   By: Rolm Baptise M.D.   On: 07/14/2019 19:35   Mr Liver W [id number] Contrast  Result Date: 07/15/2019 CLINICAL DATA:  Abdominal pain.  Cirrhosis.  Evaluate liver lesion. EXAM: MRI ABDOMEN WITHOUT AND WITH CONTRAST TECHNIQUE: Multiplanar multisequence MR imaging of the abdomen was performed both before and after the administration of intravenous contrast. CONTRAST:  4mL GADAVIST GADOBUTROL 1 MMOL/ML IV SOLN COMPARISON:  CT AP 07/14/2019 FINDINGS: Lower chest: No acute findings. Hepatobiliary: The liver is cirrhotic with hypertrophy of the caudate lobe and lateral segment of left lobe. Contour the liver is lobular. Diffusely heterogeneous enhancement is identified throughout both  lobes. No focal areas of masslike enhancement identified. On the delayed images there are no abnormal areas of washout specific for HCC. Moderate distension of the gallbladder. No gallstones, gallbladder wall thickening. There is increase caliber of the CBD which measures 1.1 cm in maximum thickness. No choledocholithiasis or obstructing mass. Pancreas: No mass, inflammatory changes, or other parenchymal abnormality identified. Spleen:  The spleen is enlarged measuring 13.7 cm in length. Adrenals/Urinary Tract: Normal appearance of the adrenal glands. No kidney mass or hydronephrosis identified. Stomach/Bowel: Visualized portions within the abdomen are unremarkable. Vascular/Lymphatic: Aortic atherosclerosis without aneurysm. Small esophageal varices. Left upper quadrant varices also noted. No adenopathy. Other:  There is a large volume of ascites within the abdomen. Musculoskeletal: No suspicious bone lesions identified. IMPRESSION: 1. Morphologic features of liver compatible with cirrhosis. No specific findings identified to suggest hepatoma. 2. Stigmata of portal venous  hypertension including splenomegaly, esophageal varices and ascites. Electronically Signed   By: Kerby Moors M.D.   On: 07/15/2019 04:16    Pending Labs Unresulted Labs (From admission, onward)    Start     Ordered   07/14/19 2117  Hemoglobin and hematocrit, blood  Now then every 6 hours,   STAT     07/14/19 2122          Vitals/Pain Today's Vitals   07/15/19 0616 07/15/19 0617 07/15/19 0640 07/15/19 0647  BP: (!) 82/56 (!) 82/56 (!) 80/54 (!) 92/59  Pulse: 74 71 (!) 58 62  Resp: (!) 23 18 14    Temp: 97.7 F (36.5 C) 97.7 F (36.5 C) 98.3 F (36.8 C)   TempSrc: Oral Oral Oral   SpO2: 100% 99% 99%   Weight:      Height:      PainSc:        Isolation Precautions No active isolations  Medications Medications  digoxin (LANOXIN) tablet 0.125 mg (0.125 mg Oral Given 07/15/19 0233)  midodrine (PROAMATINE) tablet 5 mg (has no administration in time range)  FLUoxetine (PROZAC) capsule 10 mg (has no administration in time range)  baclofen (LIORESAL) tablet 10 mg (10 mg Oral Given 07/15/19 0235)  gabapentin (NEURONTIN) capsule 100 mg (100 mg Oral Given 07/15/19 0239)  tiZANidine (ZANAFLEX) tablet 4 mg (4 mg Oral Given 07/15/19 0236)  pantoprazole (PROTONIX) injection 40 mg (40 mg Intravenous Given 07/15/19 0302)  0.9 % NaCl with KCl 40 mEq / L  infusion (100 mL/hr Intravenous New Bag/Given 07/15/19 0410)  acetaminophen (TYLENOL) tablet 650 mg (has no administration in time range)    Or  acetaminophen (TYLENOL) suppository 650 mg (has no administration in time range)  traZODone (DESYREL) tablet 25 mg (has no administration in time range)  ondansetron (ZOFRAN) tablet 4 mg (has no administration in time range)    Or  ondansetron (ZOFRAN) injection 4 mg (has no administration in time range)  folic acid (FOLVITE) tablet 1 mg (1 mg Oral Given 07/15/19 0252)  multivitamin with minerals tablet 1 tablet (1 tablet Oral Given 07/15/19 0258)  thiamine (VITAMIN B-1) tablet 100 mg (100  mg Oral Not Given 07/15/19 0436)  cefTRIAXone (ROCEPHIN) 1 g in sodium chloride 0.9 % 100 mL IVPB (has no administration in time range)  morphine 2 MG/ML injection 2 mg (has no administration in time range)  potassium chloride (KLOR-CON) packet 40 mEq (40 mEq Oral Not Given 07/15/19 0301)  rOPINIRole (REQUIP) tablet 0.5 mg (0.5 mg Oral Not Given 07/15/19 0435)  0.9 %  sodium chloride infusion (Manually program via Guardrails IV Fluids) (  has no administration in time range)  octreotide (SANDOSTATIN) 2 mcg/mL load via infusion 50 mcg (has no administration in time range)    And  octreotide (SANDOSTATIN) 500 mcg in sodium chloride 0.9 % 250 mL (2 mcg/mL) infusion (has no administration in time range)  ondansetron (ZOFRAN) injection 4 mg (4 mg Intravenous Given 07/14/19 1811)  morphine 4 MG/ML injection 4 mg (4 mg Intravenous Given 07/14/19 1811)  HYDROmorphone (DILAUDID) injection 1 mg (1 mg Intravenous Given 07/14/19 1844)  potassium chloride 10 mEq in 100 mL IVPB (0 mEq Intravenous Stopped 07/14/19 2207)  iohexol (OMNIPAQUE) 300 MG/ML solution 100 mL (100 mLs Intravenous Contrast Given 07/14/19 1906)  cefTRIAXone (ROCEPHIN) 1 g in sodium chloride 0.9 % 100 mL IVPB (0 g Intravenous Stopped 07/14/19 2337)  sodium chloride 0.9 % bolus 1,000 mL (0 mLs Intravenous Stopped 07/15/19 0208)  potassium chloride (KLOR-CON) packet 40 mEq (40 mEq Oral Given 07/15/19 0253)  HYDROmorphone (DILAUDID) injection 1 mg (1 mg Intravenous Given 07/14/19 2252)  gadobutrol (GADAVIST) 1 MMOL/ML injection 5 mL (5 mLs Intravenous Contrast Given 07/15/19 0132)  sodium chloride 0.9 % bolus 1,000 mL (0 mLs Intravenous Stopped 07/15/19 0526)    Mobility walks Low fall risk   Focused Assessments medicine    R Recommendations: See Admitting Provider Note  Report given to:   Additional Notes:

## 2019-07-15 NOTE — ED Notes (Signed)
Assumed care of patient aox4, reports her pressure is always down when she is in hospital. patient hypotensive receiving   A unit of PRBC's. normal saline with potassiun also infusing. Patient reports pain to abd that radiates down into bil;ateral  feet.

## 2019-07-15 NOTE — ED Notes (Signed)
Blood pressure noted to have come down from 92/64 @0430  to 81/62 at present time, Benjamine Mola NP notified, received orders for stat labs and fluid bolus

## 2019-07-15 NOTE — Progress Notes (Addendum)
  BRIEF OVERNIGHT REPORT  SUBJECTIVE: Patient hypotensive with BP noted to be 80/55 mm Hg.  OBJECTIVE: Patient continues to c/o abdominal pain. She is afebrile with blood pressure 80/55 mm Hg and pulse rate 61 beats/min. There were no focal neurological deficits; she is alert and oriented x4. Reviewed most recent labs @ 1600 Hgb was 12.7. Per nursing staff, no follow H&H was obtained due to patient being a difficulty IV stick.  ASSESSMENT: 58 y.o. Caucasian female with a known history of alcoholic liver cirrhosis with ascites as well as previous history of GI bleeding that was thought to be secondary to hemorrhagic colitis, who presented to the emergency room with acute onset of bright red bleeding per rectum with associated abdominal pain.  PLAN:  GI Bleed = Patient presenting with active rectal bleeding Acute/Chronic Upper/Lower, with evidence of hemodynamic instability - Repeat labs shows drop in Hgb from 12.7 to 9.1 - Will transfuse 2 units of PRBCs - Continue IVF resuscitation to maintain MAP>65 - H&H monitoring q6h - Blood Consent.  Transfuse PRN Hgb<8 - Pantoprazole IV  - NPO for pending endoscopy - Helicobacter pylori Ab + stool Ag.  If either positive, treat - Hold NSAIDs, steroids, ASA - GI Consult for EGD/Colonoscopy    Rufina Falco, DNP, CCRN, FNP-C [facility] Nurse Practitioner Between 7pm to [place] - Pager [phone] Actively using [employer] secure chat messaging  After 7am go to [email or web address] - [email or web address] select [facility]  [facility] [employer]  [phone]

## 2019-07-15 NOTE — Progress Notes (Addendum)
Progress Note    Anita Reed  [id number] DOB: 1960/09/12  DOA: 07/14/2019 PCP: Anita Hartshorn, FNP      Brief Narrative:    Medical records reviewed and are as summarized below:  Anita Reed is an 58 y.o. female with alcoholic liver cirrhosis with ascites (recent paracentesis), history of SBP, recent C. difficile colitis, paroxysmal atrial flutter/atrial fibrillation, peripheral neuropathy, scented to the hospital with abdominal pain and rectal bleeding.  She developed acute blood loss anemia requiring 2 units of blood.      Assessment/Plan:   Active Problems:   GI bleeding   Hypotension   Hypokalemia   Bandemia   Body mass index is 19.2 kg/m.   Acute GI bleeding/history of recent C. difficile infection: Obtain stool for C. difficile toxin and GI panel as recommended by her gastroenterologist.  Empiric treatment with oral vancomycin for now pending C. difficile test.  Continue IV Protonix for now.  Alcoholic liver cirrhosis, ascites with history of SBP/abdominal pain: Plan for paracentesis tomorrow.  Continue IV Rocephin.  Analgesics as needed for pain.  Acute blood loss anemia: Status post transfusion with 2 units of packed red blood cells.  H&H is stable.  Hypotension: Ordered normal saline bolus because of persistent low BP.  IV fluids will be discontinued for now to avoid fluid overload  Leukocytosis: Continue empiric oral vancomycin and IV Rocephin.  Hypokalemia and hypomagnesemia: Replete potassium and magnesium respectively.  Paroxysmal atrial fibrillation/atrial flutter: She is not on anticoagulation because of high bleeding risk.  Suspected 5.5 cm exophytic mass on right hepatic lobe: This was not present on MRI of the liver.  Peripheral neuropathy: Analgesics as needed for pain.   Family Communication/Anticipated D/C date and plan/Code Status   DVT prophylaxis: SCDs Code Status: Full code Family Communication: Plan discussed with the  patient Disposition Plan: Possible discharge to home in 2 to 3 days      Subjective:   C/o abdominal pain.  No hematemesis or rectal bleeding today.  Objective:    Vitals:   07/15/19 1059 07/15/19 1130 07/15/19 1200 07/15/19 1400  BP: 93/62 97/60  138/72  Pulse: 86 (!) 101 99 92  Resp:  18  18  Temp: 98.1 F (36.7 C) 98.2 F (36.8 C)  98.1 F (36.7 C)  TempSrc: Oral   Oral  SpO2: 95% 99%  98%  Weight:      Height:        Intake/Output Summary (Last 24 hours) at 07/15/2019 1512 Last data filed at 07/15/2019 1400 Gross per 24 hour  Intake 2877.04 ml  Output --  Net 2877.04 ml   Filed Weights   07/14/19 1554  Weight: 59 kg    Exam:  GEN: NAD SKIN: No rash EYES: no pallor or icterus ENT: MMM CV: RRR PULM: CTA B ABD: soft, mildly distended, epigastric tenderness, +BS CNS: AAO x 3, non focal EXT: b/l leg edema with mild tenderness   Data Reviewed:   I have personally reviewed following labs and imaging studies:  Labs: Labs show the following:   Basic Metabolic Panel: Recent Labs  Lab 07/14/19 1754 07/15/19 0433  NA 138 136  K 2.6* 3.1*  CL 102 103  CO2 22 25  GLUCOSE 96 98  BUN 8 8  CREATININE 0.58 0.54  CALCIUM 7.8* 7.7*  MG  --  1.4*   GFR Estimated Creatinine Clearance: 72.3 mL/min (by C-G formula based on SCr of 0.54 mg/dL). Liver Function Tests: Recent Labs  Lab 07/14/19 1754  AST 57*   57*  ALT 20   20  ALKPHOS 354*   361*  BILITOT 2.1*   2.1*  PROT 5.8*   5.8*  ALBUMIN 3.3*   3.3*   Recent Labs  Lab 07/14/19 1754  LIPASE 37   No results for input(s): AMMONIA in the last 168 hours. Coagulation profile Recent Labs  Lab 07/14/19 1754  INR 1.2    CBC: Recent Labs  Lab 07/14/19 1603 07/15/19 0433 07/15/19 0911  WBC 16.8* 18.8*  --   HGB 12.7 9.1* 12.3  HCT 39.5 28.1* 40.1  MCV 75.7* 77.6*  --   PLT 279 187  --    Cardiac Enzymes: No results for input(s): CKTOTAL, CKMB, CKMBINDEX, TROPONINI in the last 168  hours. BNP (last 3 results) No results for input(s): PROBNP in the last 8760 hours. CBG: No results for input(s): GLUCAP in the last 168 hours. D-Dimer: No results for input(s): DDIMER in the last 72 hours. Hgb A1c: No results for input(s): HGBA1C in the last 72 hours. Lipid Profile: No results for input(s): CHOL, HDL, LDLCALC, TRIG, CHOLHDL, LDLDIRECT in the last 72 hours. Thyroid function studies: No results for input(s): TSH, T4TOTAL, T3FREE, THYROIDAB in the last 72 hours.  Invalid input(s): FREET3 Anemia work up: No results for input(s): VITAMINB12, FOLATE, FERRITIN, TIBC, IRON, RETICCTPCT in the last 72 hours. Sepsis Labs: Recent Labs  Lab 07/14/19 1603 07/15/19 0433  WBC 16.8* 18.8*    Microbiology Recent Results (from the past 240 hour(s))  SARS CORONAVIRUS 2 (TAT 6-24 HRS) Nasopharyngeal Urine, Clean Catch     Status: None   Collection Time: 07/14/19  9:10 PM   Specimen: Urine, Clean Catch; Nasopharyngeal  Result Value Ref Range Status   SARS Coronavirus 2 NEGATIVE NEGATIVE Final    Comment: (NOTE) SARS-CoV-2 target nucleic acids are NOT DETECTED. The SARS-CoV-2 RNA is generally detectable in upper and lower respiratory specimens during the acute phase of infection. Negative results do not preclude SARS-CoV-2 infection, do not rule out co-infections with other pathogens, and should not be used as the sole basis for treatment or other patient management decisions. Negative results must be combined with clinical observations, patient history, and epidemiological information. The expected result is Negative. Fact Sheet for Patients: SugarRoll.be Fact Sheet for Healthcare Providers: https://www.woods-mathews.com/ This test is not yet approved or cleared by the [place] FDA and  has been authorized for detection and/or diagnosis of SARS-CoV-2 by FDA under an Emergency Use Authorization (EUA). This EUA will remain  in  effect (meaning this test can be used) for the duration of the COVID-19 declaration under Section 56 4(b)(1) of the Act, 21 U.S.C. section 360bbb-3(b)(1), unless the authorization is terminated or revoked sooner. Performed at [facility], [place] [address], [place], [place] [address]     Procedures and diagnostic studies:  Ct Abdomen Pelvis W Contrast  Result Date: 07/14/2019 CLINICAL DATA:  Abdominal pain, bloody stools EXAM: CT ABDOMEN AND PELVIS WITH CONTRAST TECHNIQUE: Multidetector CT imaging of the abdomen and pelvis was performed using the standard protocol following bolus administration of intravenous contrast. CONTRAST:  146mL OMNIPAQUE IOHEXOL 300 MG/ML  SOLN COMPARISON:  06/23/2019 FINDINGS: Lower chest: No acute abnormality Hepatobiliary: Nodular contours of the liver compatible with cirrhosis. Heterogeneous enhancement. Concern for possible mass in the inferior right hepatic lobe versus regenerating nodule measuring 5.5 cm. No biliary ductal dilatation. Gallbladder is distended. Pancreas: No focal abnormality or ductal dilatation. Spleen: Splenomegaly with  a craniocaudal length of 14.4 cm. Adrenals/Urinary Tract: No adrenal abnormality. No focal renal abnormality. No stones or hydronephrosis. Urinary bladder is unremarkable. Stomach/Bowel: Stomach, large and small bowel grossly unremarkable. Vascular/Lymphatic: Aortic atherosclerosis. No enlarged abdominal or pelvic lymph nodes. Reproductive: Prior hysterectomy.  No adnexal masses. Other: Large volume ascites in the abdomen and pelvis. Musculoskeletal: No acute bony abnormality. IMPRESSION: Changes of cirrhosis with splenomegaly and large volume ascites. 5.5 cm exophytic appearing masslike area off the inferior medial right hepatic lobe. This could reflect regenerating nodule, but recommend further evaluation with elective MRI of the liver to exclude hepatocellular carcinoma. Aortic atherosclerosis. Electronically Signed   By: Rolm Baptise M.D.   On: 07/14/2019 19:35   Mr Liver W [id number] Contrast  Result Date: 07/15/2019 CLINICAL DATA:  Abdominal pain.  Cirrhosis.  Evaluate liver lesion. EXAM: MRI ABDOMEN WITHOUT AND WITH CONTRAST TECHNIQUE: Multiplanar multisequence MR imaging of the abdomen was performed both before and after the administration of intravenous contrast. CONTRAST:  90mL GADAVIST GADOBUTROL 1 MMOL/ML IV SOLN COMPARISON:  CT AP 07/14/2019 FINDINGS: Lower chest: No acute findings. Hepatobiliary: The liver is cirrhotic with hypertrophy of the caudate lobe and lateral segment of left lobe. Contour the liver is lobular. Diffusely heterogeneous enhancement is identified throughout both lobes. No focal areas of masslike enhancement identified. On the delayed images there are no abnormal areas of washout specific for HCC. Moderate distension of the gallbladder. No gallstones, gallbladder wall thickening. There is increase caliber of the CBD which measures 1.1 cm in maximum thickness. No choledocholithiasis or obstructing mass. Pancreas: No mass, inflammatory changes, or other parenchymal abnormality identified. Spleen:  The spleen is enlarged measuring 13.7 cm in length. Adrenals/Urinary Tract: Normal appearance of the adrenal glands. No kidney mass or hydronephrosis identified. Stomach/Bowel: Visualized portions within the abdomen are unremarkable. Vascular/Lymphatic: Aortic atherosclerosis without aneurysm. Small esophageal varices. Left upper quadrant varices also noted. No adenopathy. Other:  There is a large volume of ascites within the abdomen. Musculoskeletal: No suspicious bone lesions identified. IMPRESSION: 1. Morphologic features of liver compatible with cirrhosis. No specific findings identified to suggest hepatoma. 2. Stigmata of portal venous hypertension including splenomegaly, esophageal varices and ascites. Electronically Signed   By: Kerby Moors M.D.   On: 07/15/2019 04:16    Medications:    baclofen  10 mg Oral  TID   digoxin  0.125 mg Oral Daily   FLUoxetine  10 mg Oral Daily   folic acid  1 mg Oral Daily   gabapentin  100 mg Oral BID   midodrine  5 mg Oral TID WC   multivitamin with minerals  1 tablet Oral Daily   pantoprazole (PROTONIX) IV  40 mg Intravenous Q12H   potassium chloride  40 mEq Oral Once   rOPINIRole  0.5 mg Oral QHS   thiamine  100 mg Oral Daily   tiZANidine  4 mg Oral QHS   Continuous Infusions:  cefTRIAXone (ROCEPHIN)  IV 1 g (07/15/19 1346)   octreotide  (SANDOSTATIN)    IV infusion 50 mcg/hr (07/15/19 1026)     LOS: 1 day   Alisa Stjames  [facility] Pager [phone].   *Please refer to [email or web address], password [email or web address] to get updated schedule on who will round on this patient, as hospitalists switch teams weekly. If 7PM-7AM, please contact night-coverage at [email or web address], password [email or web address] for any overnight needs.  07/15/2019, 3:12 PM

## 2019-07-16 ENCOUNTER — Inpatient Hospital Stay: Payer: Medicare Other

## 2019-07-16 DIAGNOSIS — A0472 Enterocolitis due to Clostridium difficile, not specified as recurrent: Secondary | ICD-10-CM

## 2019-07-16 LAB — CBC WITH DIFFERENTIAL/PLATELET
Abs Immature Granulocytes: 0.1 10*3/uL — ABNORMAL HIGH (ref 0.00–0.07)
Basophils Absolute: 0.1 10*3/uL (ref 0.0–0.1)
Basophils Relative: 1 %
Eosinophils Absolute: 0.2 10*3/uL (ref 0.0–0.5)
Eosinophils Relative: 1 %
HCT: 38.7 % (ref 36.0–46.0)
Hemoglobin: 12 g/dL (ref 12.0–15.0)
Immature Granulocytes: 1 %
Lymphocytes Relative: 12 %
Lymphs Abs: 2.3 10*3/uL (ref 0.7–4.0)
MCH: 25.5 pg — ABNORMAL LOW (ref 26.0–34.0)
MCHC: 31 g/dL (ref 30.0–36.0)
MCV: 82.3 fL (ref 80.0–100.0)
Monocytes Absolute: 1.8 10*3/uL — ABNORMAL HIGH (ref 0.1–1.0)
Monocytes Relative: 9 %
Neutro Abs: 14.8 10*3/uL — ABNORMAL HIGH (ref 1.7–7.7)
Neutrophils Relative %: 76 %
Platelets: 185 10*3/uL (ref 150–400)
RBC: 4.7 MIL/uL (ref 3.87–5.11)
RDW: 20 % — ABNORMAL HIGH (ref 11.5–15.5)
WBC: 19.3 10*3/uL — ABNORMAL HIGH (ref 4.0–10.5)
nRBC: 0 % (ref 0.0–0.2)

## 2019-07-16 LAB — BPAM RBC
Blood Product Expiration Date: 202012092359
Blood Product Expiration Date: 202012092359
ISSUE DATE / TIME: 202011150611
ISSUE DATE / TIME: 202011151108
Unit Type and Rh: 5100
Unit Type and Rh: 5100

## 2019-07-16 LAB — BASIC METABOLIC PANEL
Anion gap: 7 (ref 5–15)
BUN: <5 mg/dL — ABNORMAL LOW (ref 6–20)
CO2: 27 mmol/L (ref 22–32)
Calcium: 8.6 mg/dL — ABNORMAL LOW (ref 8.9–10.3)
Chloride: 105 mmol/L (ref 98–111)
Creatinine, Ser: 0.41 mg/dL — ABNORMAL LOW (ref 0.44–1.00)
GFR calc Af Amer: >60 mL/min (ref >60)
GFR calc non Af Amer: >60 mL/min (ref >60)
Glucose, Bld: 96 mg/dL (ref 70–99)
Potassium: 4.2 mmol/L (ref 3.5–5.1)
Sodium: 139 mmol/L (ref 135–145)

## 2019-07-16 LAB — TYPE AND SCREEN
ABO/RH(D): O POS
Antibody Screen: NEGATIVE
Unit division: 0
Unit division: 0

## 2019-07-16 LAB — BODY FLUID CELL COUNT WITH DIFFERENTIAL
Eos, Fluid: 0 %
Lymphs, Fluid: 19 %
Monocyte-Macrophage-Serous Fluid: 34 %
Neutrophil Count, Fluid: 47 %
Other Cells, Fluid: 0 %
Total Nucleated Cell Count, Fluid: 164 cu mm

## 2019-07-16 LAB — MAGNESIUM: Magnesium: 2.1 mg/dL (ref 1.7–2.4)

## 2019-07-16 LAB — PATHOLOGIST SMEAR REVIEW

## 2019-07-16 IMAGING — US US PARACENTESIS
1 series · 5 of 5 positions shown · non-contrast
Comparison: none

INDICATION: History of cirrhosis. Recurrent abdominal distention and ascites.
Request diagnostic and therapeutic paracentesis.

[Series 1: us paracentesis · 5 of 5 slices shown]
[im 1/5]
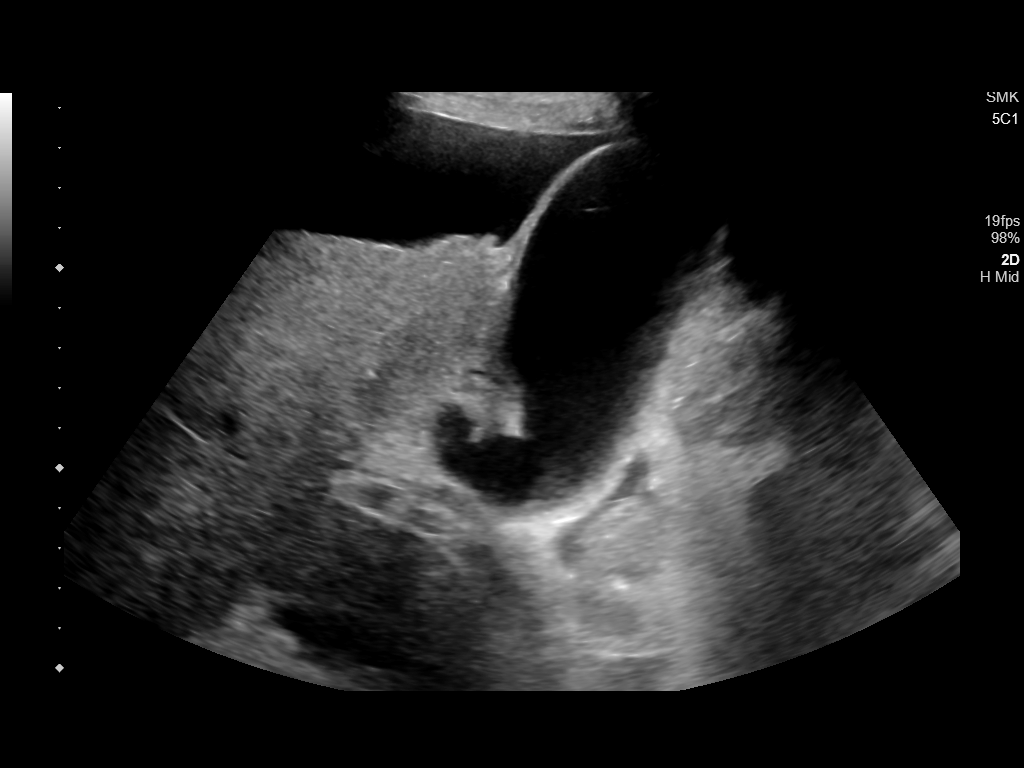
[im 2/5]
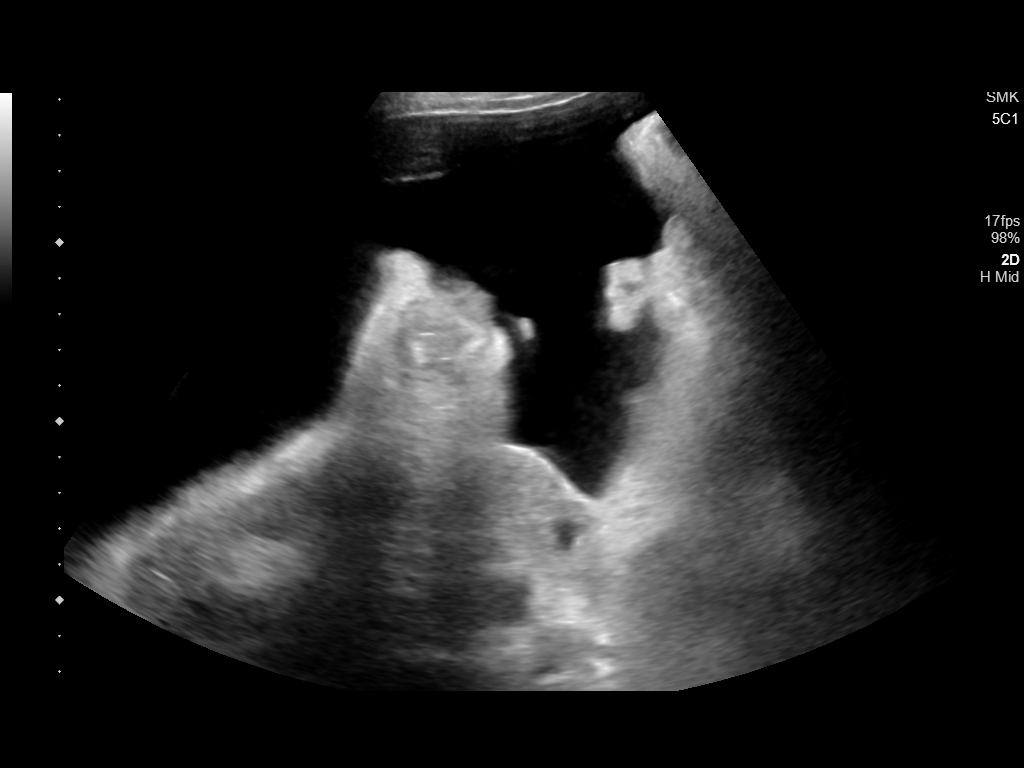
[im 3/5]
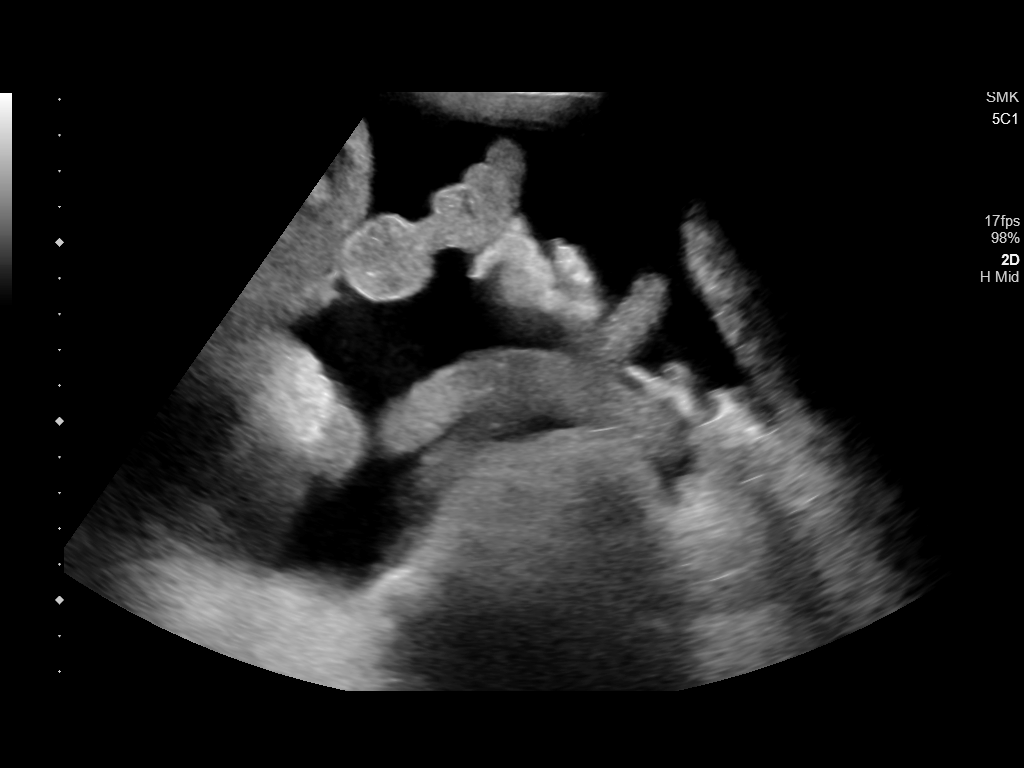
[im 4/5]
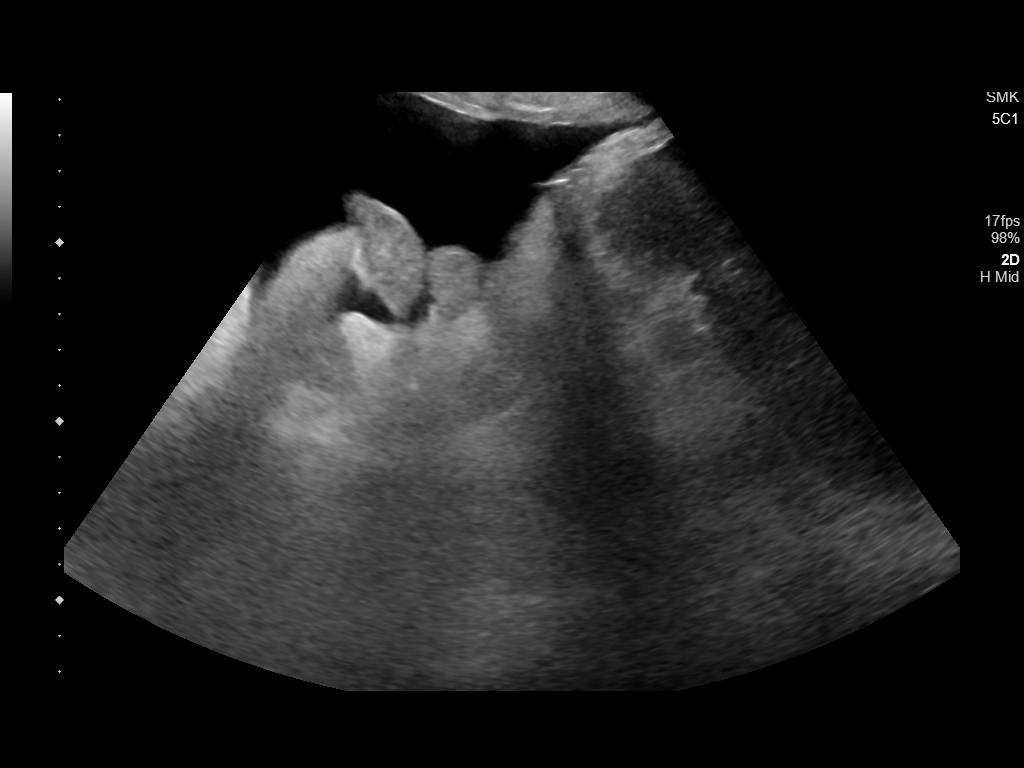
[im 5/5]
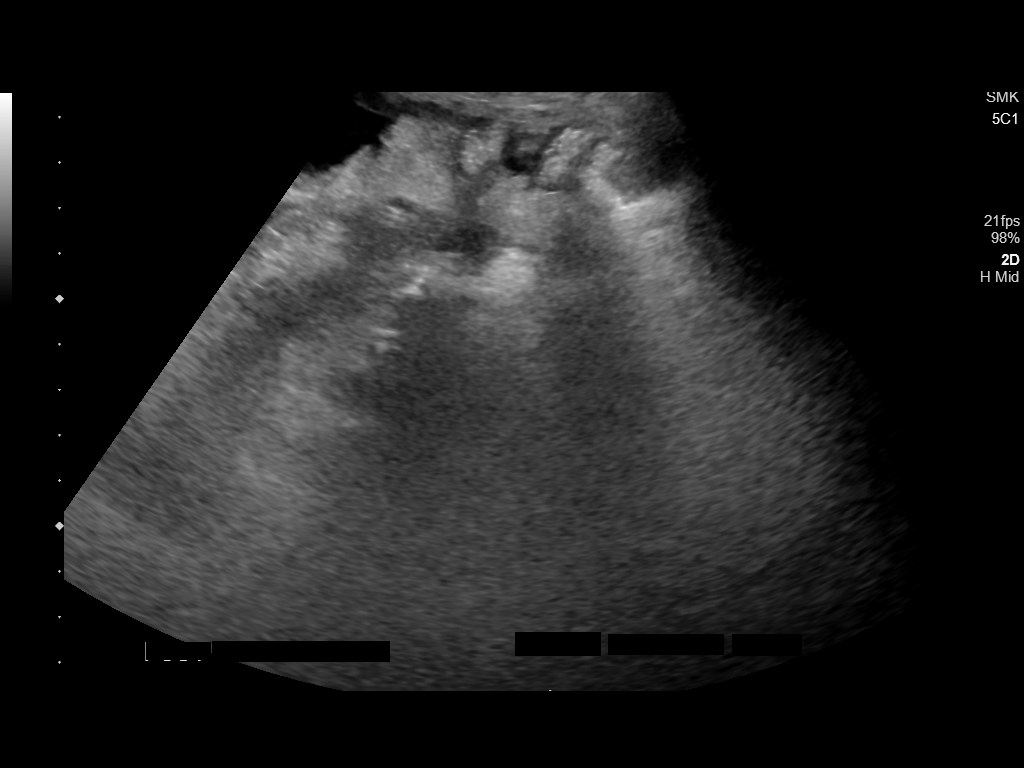

[5 of 5 positions shown; findings below may reference images not displayed]

EXAM:
ULTRASOUND GUIDED RIGHT LOWER QUADRANT PARACENTESIS

MEDICATIONS:
None.

COMPLICATIONS:
None immediate.

PROCEDURE:
Informed written consent was obtained from the patient after a
discussion of the risks, benefits and alternatives to treatment. A
timeout was performed prior to the initiation of the procedure.

Initial ultrasound scanning demonstrates a large amount of ascites
within the right lower abdominal quadrant. The right lower abdomen
was prepped and draped in the usual sterile fashion. 1% lidocaine
was used for local anesthesia.

Following this, a 6 Fr Safe-T-Centesis catheter was introduced. An
ultrasound image was saved for documentation purposes. The
paracentesis was performed. The catheter was removed and a dressing
was applied. The patient tolerated the procedure well without
immediate post procedural complication.
FINDINGS: A total of approximately 5.1 L of clear yellow fluid was removed.
Samples were sent to the laboratory as requested by the clinical
team.
IMPRESSION: Successful ultrasound-guided paracentesis yielding 5.1 liters of
peritoneal fluid.

## 2019-07-16 MED ORDER — FLUCONAZOLE 50 MG PO TABS
150.0000 mg | ORAL_TABLET | Freq: Once | ORAL | Status: AC
  Administered 2019-07-16: 150 mg via ORAL
  Filled 2019-07-16: qty 1

## 2019-07-16 NOTE — Progress Notes (Signed)
Anita Lame, MD [facility]   [address] [place], [place] [address] Phone: [phone] Fax : [phone]   Subjective: The patient is reporting chronic pain and states that her appetite is decreased because she has so much pain.  The patient has a history of cirrhosis and has had a paracentesis with over 5 L taken off twice in the last week.  The patient was on diuretics at home but due to her hypotension she has not been continued on the diuretics.  The patient was found to have C. difficile colitis on her stool test yesterday.  The patient is now on vancomycin 4 times a day.   Objective: Vital signs in last 24 hours: Vitals:   07/15/19 2155 07/16/19 0617 07/16/19 1011 07/16/19 1042  BP: 125/79 (!) 96/57 (!) 93/55 (!) 96/54  Pulse: 89 72 72 71  Resp: 16 18 18 18   Temp: 97.7 F (36.5 C) 97.9 F (36.6 C)    TempSrc: Oral Oral    SpO2: 93% 90% 93% 91%  Weight:      Height:       Weight change:   Intake/Output Summary (Last 24 hours) at 07/16/2019 1339 Last data filed at 07/16/2019 [id number] Gross per 24 hour  Intake 1732.53 ml  Output --  Net 1732.53 ml     Exam: Alert and orientated x3 sitting up in bed tolerating p.o.'s. Patient in no apparent distress   Lab Results: @LABTEST2 @ Micro Results: Recent Results (from the past 240 hour(s))  SARS CORONAVIRUS 2 (TAT 6-24 HRS) Nasopharyngeal Urine, Clean Catch     Status: None   Collection Time: 07/14/19  9:10 PM   Specimen: Urine, Clean Catch; Nasopharyngeal  Result Value Ref Range Status   SARS Coronavirus 2 NEGATIVE NEGATIVE Final    Comment: (NOTE) SARS-CoV-2 target nucleic acids are NOT DETECTED. The SARS-CoV-2 RNA is generally detectable in upper and lower respiratory specimens during the acute phase of infection. Negative results do not preclude SARS-CoV-2 infection, do not rule out co-infections with other pathogens, and should not be used as the sole basis for treatment or other patient management  decisions. Negative results must be combined with clinical observations, patient history, and epidemiological information. The expected result is Negative. Fact Sheet for Patients: SugarRoll.be Fact Sheet for Healthcare Providers: https://www.woods-mathews.com/ This test is not yet approved or cleared by the [place] FDA and  has been authorized for detection and/or diagnosis of SARS-CoV-2 by FDA under an Emergency Use Authorization (EUA). This EUA will remain  in effect (meaning this test can be used) for the duration of the COVID-19 declaration under Section 56 4(b)(1) of the Act, 21 U.S.C. section 360bbb-3(b)(1), unless the authorization is terminated or revoked sooner. Performed at [facility], [place] [address], [place], [place] [address]   C difficile quick scan w PCR reflex     Status: Abnormal   Collection Time: 07/15/19 11:40 AM   Specimen: STOOL  Result Value Ref Range Status   C Diff antigen POSITIVE (A) NEGATIVE Final   C Diff toxin POSITIVE (A) NEGATIVE Final   C Diff interpretation Toxin producing C. difficile detected.  Final    Comment: CRITICAL RESULT CALLED TO, READ BACK BY AND VERIFIED WITH: JOHNSON,E AT [id number] ON 07/15/2019 BY MOSLEY,J Performed at [facility], [address] [place], [place] [address]   Gastrointestinal Panel by PCR , Stool     Status: None   Collection Time: 07/15/19 11:40 AM   Specimen: STOOL  Result Value Ref Range Status   Campylobacter species NOT DETECTED NOT DETECTED Final   Plesimonas shigelloides NOT DETECTED NOT DETECTED Final   Salmonella species NOT DETECTED NOT DETECTED Final   Yersinia enterocolitica NOT DETECTED NOT DETECTED Final   Vibrio species NOT DETECTED NOT DETECTED Final   Vibrio cholerae NOT DETECTED NOT DETECTED Final   Enteroaggregative E coli (EAEC) NOT DETECTED NOT DETECTED Final   Enteropathogenic E coli (EPEC) NOT DETECTED NOT DETECTED Final    Enterotoxigenic E coli (ETEC) NOT DETECTED NOT DETECTED Final   Shiga like toxin producing E coli (STEC) NOT DETECTED NOT DETECTED Final   Shigella/Enteroinvasive E coli (EIEC) NOT DETECTED NOT DETECTED Final   Cryptosporidium NOT DETECTED NOT DETECTED Final   Cyclospora cayetanensis NOT DETECTED NOT DETECTED Final   Entamoeba histolytica NOT DETECTED NOT DETECTED Final   Giardia lamblia NOT DETECTED NOT DETECTED Final   Adenovirus F40/41 NOT DETECTED NOT DETECTED Final   Astrovirus NOT DETECTED NOT DETECTED Final   Norovirus GI/GII NOT DETECTED NOT DETECTED Final   Rotavirus A NOT DETECTED NOT DETECTED Final   Sapovirus (I, II, IV, and V) NOT DETECTED NOT DETECTED Final    Comment: Performed at [facility], [address] [place], [place] [address]   Studies/Results: Ct Abdomen Pelvis W Contrast  Result Date: 07/14/2019 CLINICAL DATA:  Abdominal pain, bloody stools EXAM: CT ABDOMEN AND PELVIS WITH CONTRAST TECHNIQUE: Multidetector CT imaging of the abdomen and pelvis was performed using the standard protocol following bolus administration of intravenous contrast. CONTRAST:  122mL OMNIPAQUE IOHEXOL 300 MG/ML  SOLN COMPARISON:  06/23/2019 FINDINGS: Lower chest: No acute abnormality Hepatobiliary: Nodular contours of the liver compatible with cirrhosis. Heterogeneous enhancement. Concern for possible mass in the inferior right hepatic lobe versus regenerating nodule measuring 5.5 cm. No biliary ductal dilatation. Gallbladder is distended. Pancreas: No focal abnormality or ductal dilatation. Spleen: Splenomegaly with a craniocaudal length of 14.4 cm. Adrenals/Urinary Tract: No adrenal abnormality. No focal renal abnormality. No stones or hydronephrosis. Urinary bladder is unremarkable. Stomach/Bowel: Stomach, large and small bowel grossly unremarkable. Vascular/Lymphatic: Aortic atherosclerosis. No enlarged abdominal or pelvic lymph nodes. Reproductive: Prior hysterectomy.  No adnexal  masses. Other: Large volume ascites in the abdomen and pelvis. Musculoskeletal: No acute bony abnormality. IMPRESSION: Changes of cirrhosis with splenomegaly and large volume ascites. 5.5 cm exophytic appearing masslike area off the inferior medial right hepatic lobe. This could reflect regenerating nodule, but recommend further evaluation with elective MRI of the liver to exclude hepatocellular carcinoma. Aortic atherosclerosis. Electronically Signed   By: Rolm Baptise M.D.   On: 07/14/2019 19:35   Mr Liver W [id number] Contrast  Result Date: 07/15/2019 CLINICAL DATA:  Abdominal pain.  Cirrhosis.  Evaluate liver lesion. EXAM: MRI ABDOMEN WITHOUT AND WITH CONTRAST TECHNIQUE: Multiplanar multisequence MR imaging of the abdomen was performed both before and after the administration of intravenous contrast. CONTRAST:  14mL GADAVIST GADOBUTROL 1 MMOL/ML IV SOLN COMPARISON:  CT AP 07/14/2019 FINDINGS: Lower chest: No acute findings. Hepatobiliary: The liver is cirrhotic with hypertrophy of the caudate lobe and lateral segment of left lobe. Contour the liver is lobular. Diffusely heterogeneous enhancement is identified throughout both lobes. No focal areas of masslike enhancement identified. On the delayed images there are no abnormal areas of washout specific for HCC. Moderate distension of the gallbladder. No gallstones, gallbladder wall thickening. There is increase caliber of the CBD which measures 1.1 cm in maximum thickness. No choledocholithiasis or obstructing mass. Pancreas: No mass, inflammatory changes,  or other parenchymal abnormality identified. Spleen:  The spleen is enlarged measuring 13.7 cm in length. Adrenals/Urinary Tract: Normal appearance of the adrenal glands. No kidney mass or hydronephrosis identified. Stomach/Bowel: Visualized portions within the abdomen are unremarkable. Vascular/Lymphatic: Aortic atherosclerosis without aneurysm. Small esophageal varices. Left upper quadrant varices also noted. No  adenopathy. Other:  There is a large volume of ascites within the abdomen. Musculoskeletal: No suspicious bone lesions identified. IMPRESSION: 1. Morphologic features of liver compatible with cirrhosis. No specific findings identified to suggest hepatoma. 2. Stigmata of portal venous hypertension including splenomegaly, esophageal varices and ascites. Electronically Signed   By: Kerby Moors M.D.   On: 07/15/2019 04:16   US Paracentesis  Result Date: 07/16/2019 INDICATION: History of cirrhosis. Recurrent abdominal distention and ascites. Request diagnostic and therapeutic paracentesis. EXAM: ULTRASOUND GUIDED RIGHT LOWER QUADRANT PARACENTESIS MEDICATIONS: None. COMPLICATIONS: None immediate. PROCEDURE: Informed written consent was obtained from the patient after a discussion of the risks, benefits and alternatives to treatment. A timeout was performed prior to the initiation of the procedure. Initial ultrasound scanning demonstrates a large amount of ascites within the right lower abdominal quadrant. The right lower abdomen was prepped and draped in the usual sterile fashion. 1% lidocaine was used for local anesthesia. Following this, a 6 Fr Safe-T-Centesis catheter was introduced. An ultrasound image was saved for documentation purposes. The paracentesis was performed. The catheter was removed and a dressing was applied. The patient tolerated the procedure well without immediate post procedural complication. FINDINGS: A total of approximately 5.1 L of clear yellow fluid was removed. Samples were sent to the laboratory as requested by the clinical team. IMPRESSION: Successful ultrasound-guided paracentesis yielding 5.1 liters of peritoneal fluid. Read by: Ascencion Dike PA-C Electronically Signed   By: Markus Daft M.D.   On: 07/16/2019 11:24   Medications: I have reviewed the patient's current medications. Scheduled Meds:  baclofen  10 mg Oral TID   digoxin  0.125 mg Oral Daily   fluconazole  150 mg  Oral Once   FLUoxetine  10 mg Oral Daily   folic acid  1 mg Oral Daily   gabapentin  100 mg Oral BID   midodrine  5 mg Oral TID WC   multivitamin with minerals  1 tablet Oral Daily   pantoprazole (PROTONIX) IV  40 mg Intravenous Q12H   potassium chloride  40 mEq Oral Once   rOPINIRole  0.5 mg Oral QHS   thiamine  100 mg Oral Daily   tiZANidine  4 mg Oral QHS   vancomycin  125 mg Oral Q6H   Continuous Infusions:  sodium chloride Stopped (07/15/19 2315)   cefTRIAXone (ROCEPHIN)  IV 1 g (07/16/19 0916)   PRN Meds:.sodium chloride, acetaminophen **OR** acetaminophen, alum & mag hydroxide-simeth, morphine injection, ondansetron **OR** ondansetron (ZOFRAN) IV, oxyCODONE, traZODone   Assessment: Active Problems:   GI bleeding   Hypotension   Hypokalemia   Bandemia   C. difficile colitis    Plan: The patient is presently on vancomycin for her C. difficile and states that the bleeding has completely stopped although she continues to have diarrhea.  The patient should be continued on this for at least 10 to 14 days.  Once the patient's blood pressure is stabilized she should be started back on her Lasix plus or minus Aldactone.  This will hopefully decrease the need for her paracentesis.  The patient has been explained the plan agrees with it.   LOS: 2 days   Anita Reed 07/16/2019, 1:39  PM Pager [phone] 7am-5pm  Check AMION for 5pm -7am coverage and on weekends

## 2019-07-16 NOTE — Progress Notes (Addendum)
Progress Note    Anita Reed  [id number] DOB: 1960-10-13  DOA: 07/14/2019 PCP: Hubbard Hartshorn, FNP      Brief Narrative:    Medical records reviewed and are as summarized below:  Anita Reed is an 58 y.o. female with alcoholic liver cirrhosis with ascites (recent paracentesis), history of SBP, recent C. difficile colitis, paroxysmal atrial flutter/atrial fibrillation, peripheral neuropathy, scented to the hospital with abdominal pain and rectal bleeding.  She developed acute blood loss anemia requiring 2 units of blood.      Assessment/Plan:   Active Problems:   GI bleeding   Hypotension   Hypokalemia   Bandemia   C. difficile colitis   Body mass index is 19.2 kg/m.   C. difficile colitis/leukocytosis: stool for C. difficile toxin on 07/15/2019 came back positive.  Continue oral vancomycin.  Follow-up with gastroenterologist.  Acute GI bleeding/rectal bleeding: Resolved.  This was probably due to C. difficile colitis.  Alcoholic liver cirrhosis, ascites with history of SBP/abdominal pain: Plan for paracentesis today.  Analgesics as needed for pain.  Discontinue IV Rocephin.  This was discussed with Dr. Allen Norris, gastroenterologist, who said there was no need for SBP prophylaxis at this time.  Acute blood loss anemia: s/p transfusion with 2 units of packed red blood cells on 07/15/2019.  Hypotension: Asymptomatic.  Patient has been making frequent demands for narcotics but she has been advised to limit continue to monitor BP.  Hypokalemia and hypomagnesemia: Improved.  Paroxysmal atrial fibrillation/atrial flutter: She is not on anticoagulation because of high bleeding risk.  Suspected 5.5 cm exophytic mass on right hepatic lobe: This was not present on MRI of the liver.  Peripheral neuropathy: Analgesics as needed for pain.   Family Communication/Anticipated D/C date and plan/Code Status   DVT prophylaxis: SCDs Code Status: Full code Family  Communication: Plan discussed with the patient Disposition Plan: Possible discharge to home in 2 to 3 days      Subjective:   She still has abdominal pain but no rectal bleeding today  Objective:    Vitals:   07/15/19 2155 07/16/19 0617 07/16/19 1011 07/16/19 1042  BP: 125/79 (!) 96/57 (!) 93/55 (!) 96/54  Pulse: 89 72 72 71  Resp: 16 18 18 18   Temp: 97.7 F (36.5 C) 97.9 F (36.6 C)    TempSrc: Oral Oral    SpO2: 93% 90% 93% 91%  Weight:      Height:        Intake/Output Summary (Last 24 hours) at 07/16/2019 1051 Last data filed at 07/16/2019 0916 Gross per 24 hour  Intake 1732.53 ml  Output --  Net 1732.53 ml   Filed Weights   07/14/19 1554  Weight: 59 kg    Exam:  GEN: NAD SKIN: No rash EYES: No pallor or icterus ENT: MMM CV: RRR PULM: CTA B ABD: soft, distended, epigastric and periumbilical tenderness, +BS CNS: AAO x 3, non focal EXT: Bilateral leg edema (2+), no tenderness   Data Reviewed:   I have personally reviewed following labs and imaging studies:  Labs: Labs show the following:   Basic Metabolic Panel: Recent Labs  Lab 07/14/19 1754 07/15/19 0433 07/15/19 1758 07/16/19 0629  NA 138 136  --  139  K 2.6* 3.1*  --  4.2  CL 102 103  --  105  CO2 22 25  --  27  GLUCOSE 96 98  --  96  BUN 8 8  --  <5*  CREATININE 0.58  0.54  --  0.41*  CALCIUM 7.8* 7.7*  --  8.6*  MG  --  1.4* 2.2 2.1   GFR Estimated Creatinine Clearance: 72.3 mL/min (A) (by C-G formula based on SCr of 0.41 mg/dL (L)). Liver Function Tests: Recent Labs  Lab 07/14/19 1754  AST 57*   57*  ALT 20   20  ALKPHOS 354*   361*  BILITOT 2.1*   2.1*  PROT 5.8*   5.8*  ALBUMIN 3.3*   3.3*   Recent Labs  Lab 07/14/19 1754  LIPASE 37   No results for input(s): AMMONIA in the last 168 hours. Coagulation profile Recent Labs  Lab 07/14/19 1754  INR 1.2    CBC: Recent Labs  Lab 07/14/19 1603 07/15/19 0433 07/15/19 0911 07/15/19 1542 07/16/19 0629  WBC  16.8* 18.8*  --   --  19.3*  NEUTROABS  --   --   --   --  14.8*  HGB 12.7 9.1* 12.3 13.2 12.0  HCT 39.5 28.1* 40.1 41.6 38.7  MCV 75.7* 77.6*  --   --  82.3  PLT 279 187  --   --  185   Cardiac Enzymes: No results for input(s): CKTOTAL, CKMB, CKMBINDEX, TROPONINI in the last 168 hours. BNP (last 3 results) No results for input(s): PROBNP in the last 8760 hours. CBG: No results for input(s): GLUCAP in the last 168 hours. D-Dimer: No results for input(s): DDIMER in the last 72 hours. Hgb A1c: No results for input(s): HGBA1C in the last 72 hours. Lipid Profile: No results for input(s): CHOL, HDL, LDLCALC, TRIG, CHOLHDL, LDLDIRECT in the last 72 hours. Thyroid function studies: No results for input(s): TSH, T4TOTAL, T3FREE, THYROIDAB in the last 72 hours.  Invalid input(s): FREET3 Anemia work up: No results for input(s): VITAMINB12, FOLATE, FERRITIN, TIBC, IRON, RETICCTPCT in the last 72 hours. Sepsis Labs: Recent Labs  Lab 07/14/19 1603 07/15/19 0433 07/16/19 0629  WBC 16.8* 18.8* 19.3*    Microbiology Recent Results (from the past 240 hour(s))  SARS CORONAVIRUS 2 (TAT 6-24 HRS) Nasopharyngeal Urine, Clean Catch     Status: None   Collection Time: 07/14/19  9:10 PM   Specimen: Urine, Clean Catch; Nasopharyngeal  Result Value Ref Range Status   SARS Coronavirus 2 NEGATIVE NEGATIVE Final    Comment: (NOTE) SARS-CoV-2 target nucleic acids are NOT DETECTED. The SARS-CoV-2 RNA is generally detectable in upper and lower respiratory specimens during the acute phase of infection. Negative results do not preclude SARS-CoV-2 infection, do not rule out co-infections with other pathogens, and should not be used as the sole basis for treatment or other patient management decisions. Negative results must be combined with clinical observations, patient history, and epidemiological information. The expected result is Negative. Fact Sheet for  Patients: SugarRoll.be Fact Sheet for Healthcare Providers: https://www.woods-mathews.com/ This test is not yet approved or cleared by the [place] FDA and  has been authorized for detection and/or diagnosis of SARS-CoV-2 by FDA under an Emergency Use Authorization (EUA). This EUA will remain  in effect (meaning this test can be used) for the duration of the COVID-19 declaration under Section 56 4(b)(1) of the Act, 21 U.S.C. section 360bbb-3(b)(1), unless the authorization is terminated or revoked sooner. Performed at [facility], [place] [address], [place], [place] [address]   C difficile quick scan w PCR reflex     Status: Abnormal   Collection Time: 07/15/19 11:40 AM   Specimen: STOOL  Result Value Ref Range  Status   C Diff antigen POSITIVE (A) NEGATIVE Final   C Diff toxin POSITIVE (A) NEGATIVE Final   C Diff interpretation Toxin producing C. difficile detected.  Final    Comment: CRITICAL RESULT CALLED TO, READ BACK BY AND VERIFIED WITH: JOHNSON,E AT [id number] ON 07/15/2019 BY MOSLEY,J Performed at [facility], [address] [place], [place] [address]   Gastrointestinal Panel by PCR , Stool     Status: None   Collection Time: 07/15/19 11:40 AM   Specimen: STOOL  Result Value Ref Range Status   Campylobacter species NOT DETECTED NOT DETECTED Final   Plesimonas shigelloides NOT DETECTED NOT DETECTED Final   Salmonella species NOT DETECTED NOT DETECTED Final   Yersinia enterocolitica NOT DETECTED NOT DETECTED Final   Vibrio species NOT DETECTED NOT DETECTED Final   Vibrio cholerae NOT DETECTED NOT DETECTED Final   Enteroaggregative E coli (EAEC) NOT DETECTED NOT DETECTED Final   Enteropathogenic E coli (EPEC) NOT DETECTED NOT DETECTED Final   Enterotoxigenic E coli (ETEC) NOT DETECTED NOT DETECTED Final   Shiga like toxin producing E coli (STEC) NOT DETECTED NOT DETECTED Final   Shigella/Enteroinvasive E coli (EIEC)  NOT DETECTED NOT DETECTED Final   Cryptosporidium NOT DETECTED NOT DETECTED Final   Cyclospora cayetanensis NOT DETECTED NOT DETECTED Final   Entamoeba histolytica NOT DETECTED NOT DETECTED Final   Giardia lamblia NOT DETECTED NOT DETECTED Final   Adenovirus F40/41 NOT DETECTED NOT DETECTED Final   Astrovirus NOT DETECTED NOT DETECTED Final   Norovirus GI/GII NOT DETECTED NOT DETECTED Final   Rotavirus A NOT DETECTED NOT DETECTED Final   Sapovirus (I, II, IV, and V) NOT DETECTED NOT DETECTED Final    Comment: Performed at [facility], [address] [place], [place] [address]    Procedures and diagnostic studies:  Ct Abdomen Pelvis W Contrast  Result Date: 07/14/2019 CLINICAL DATA:  Abdominal pain, bloody stools EXAM: CT ABDOMEN AND PELVIS WITH CONTRAST TECHNIQUE: Multidetector CT imaging of the abdomen and pelvis was performed using the standard protocol following bolus administration of intravenous contrast. CONTRAST:  132mL OMNIPAQUE IOHEXOL 300 MG/ML  SOLN COMPARISON:  06/23/2019 FINDINGS: Lower chest: No acute abnormality Hepatobiliary: Nodular contours of the liver compatible with cirrhosis. Heterogeneous enhancement. Concern for possible mass in the inferior right hepatic lobe versus regenerating nodule measuring 5.5 cm. No biliary ductal dilatation. Gallbladder is distended. Pancreas: No focal abnormality or ductal dilatation. Spleen: Splenomegaly with a craniocaudal length of 14.4 cm. Adrenals/Urinary Tract: No adrenal abnormality. No focal renal abnormality. No stones or hydronephrosis. Urinary bladder is unremarkable. Stomach/Bowel: Stomach, large and small bowel grossly unremarkable. Vascular/Lymphatic: Aortic atherosclerosis. No enlarged abdominal or pelvic lymph nodes. Reproductive: Prior hysterectomy.  No adnexal masses. Other: Large volume ascites in the abdomen and pelvis. Musculoskeletal: No acute bony abnormality. IMPRESSION: Changes of cirrhosis with splenomegaly and  large volume ascites. 5.5 cm exophytic appearing masslike area off the inferior medial right hepatic lobe. This could reflect regenerating nodule, but recommend further evaluation with elective MRI of the liver to exclude hepatocellular carcinoma. Aortic atherosclerosis. Electronically Signed   By: Rolm Baptise M.D.   On: 07/14/2019 19:35   Mr Liver W [id number] Contrast  Result Date: 07/15/2019 CLINICAL DATA:  Abdominal pain.  Cirrhosis.  Evaluate liver lesion. EXAM: MRI ABDOMEN WITHOUT AND WITH CONTRAST TECHNIQUE: Multiplanar multisequence MR imaging of the abdomen was performed both before and after the administration of intravenous contrast. CONTRAST:  20mL GADAVIST GADOBUTROL 1 MMOL/ML IV SOLN COMPARISON:  CT AP 07/14/2019 FINDINGS: Lower chest: No acute findings. Hepatobiliary: The liver is cirrhotic with hypertrophy of the caudate lobe and lateral segment of left lobe. Contour the liver is lobular. Diffusely heterogeneous enhancement is identified throughout both lobes. No focal areas of masslike enhancement identified. On the delayed images there are no abnormal areas of washout specific for HCC. Moderate distension of the gallbladder. No gallstones, gallbladder wall thickening. There is increase caliber of the CBD which measures 1.1 cm in maximum thickness. No choledocholithiasis or obstructing mass. Pancreas: No mass, inflammatory changes, or other parenchymal abnormality identified. Spleen:  The spleen is enlarged measuring 13.7 cm in length. Adrenals/Urinary Tract: Normal appearance of the adrenal glands. No kidney mass or hydronephrosis identified. Stomach/Bowel: Visualized portions within the abdomen are unremarkable. Vascular/Lymphatic: Aortic atherosclerosis without aneurysm. Small esophageal varices. Left upper quadrant varices also noted. No adenopathy. Other:  There is a large volume of ascites within the abdomen. Musculoskeletal: No suspicious bone lesions identified. IMPRESSION: 1. Morphologic  features of liver compatible with cirrhosis. No specific findings identified to suggest hepatoma. 2. Stigmata of portal venous hypertension including splenomegaly, esophageal varices and ascites. Electronically Signed   By: Kerby Moors M.D.   On: 07/15/2019 04:16    Medications:    baclofen  10 mg Oral TID   digoxin  0.125 mg Oral Daily   FLUoxetine  10 mg Oral Daily   folic acid  1 mg Oral Daily   gabapentin  100 mg Oral BID   midodrine  5 mg Oral TID WC   multivitamin with minerals  1 tablet Oral Daily   pantoprazole (PROTONIX) IV  40 mg Intravenous Q12H   potassium chloride  40 mEq Oral Once   rOPINIRole  0.5 mg Oral QHS   thiamine  100 mg Oral Daily   tiZANidine  4 mg Oral QHS   vancomycin  125 mg Oral Q6H   Continuous Infusions:  sodium chloride Stopped (07/15/19 2315)   cefTRIAXone (ROCEPHIN)  IV 1 g (07/16/19 0916)     LOS: 2 days   Adja Ruff  [facility] Pager [phone].   *Please refer to [email or web address], password [email or web address] to get updated schedule on who will round on this patient, as hospitalists switch teams weekly. If 7PM-7AM, please contact night-coverage at [email or web address], password [email or web address] for any overnight needs.  07/16/2019, 10:51 AM

## 2019-07-17 ENCOUNTER — Other Ambulatory Visit: Payer: Medicare Other | Admitting: Nurse Practitioner

## 2019-07-17 LAB — BASIC METABOLIC PANEL
Anion gap: 9 (ref 5–15)
BUN: 7 mg/dL (ref 6–20)
CO2: 22 mmol/L (ref 22–32)
Calcium: 8.7 mg/dL — ABNORMAL LOW (ref 8.9–10.3)
Chloride: 105 mmol/L (ref 98–111)
Creatinine, Ser: 0.49 mg/dL (ref 0.44–1.00)
GFR calc Af Amer: >60 mL/min (ref >60)
GFR calc non Af Amer: >60 mL/min (ref >60)
Glucose, Bld: 86 mg/dL (ref 70–99)
Potassium: 4 mmol/L (ref 3.5–5.1)
Sodium: 136 mmol/L (ref 135–145)

## 2019-07-17 LAB — CBC WITH DIFFERENTIAL/PLATELET
Abs Immature Granulocytes: 0.05 10*3/uL (ref 0.00–0.07)
Basophils Absolute: 0.1 10*3/uL (ref 0.0–0.1)
Basophils Relative: 1 %
Eosinophils Absolute: 0.3 10*3/uL (ref 0.0–0.5)
Eosinophils Relative: 2 %
HCT: 42.6 % (ref 36.0–46.0)
Hemoglobin: 13.5 g/dL (ref 12.0–15.0)
Immature Granulocytes: 0 %
Lymphocytes Relative: 15 %
Lymphs Abs: 1.7 10*3/uL (ref 0.7–4.0)
MCH: 25.4 pg — ABNORMAL LOW (ref 26.0–34.0)
MCHC: 31.7 g/dL (ref 30.0–36.0)
MCV: 80.2 fL (ref 80.0–100.0)
Monocytes Absolute: 1.3 10*3/uL — ABNORMAL HIGH (ref 0.1–1.0)
Monocytes Relative: 11 %
Neutro Abs: 8.3 10*3/uL — ABNORMAL HIGH (ref 1.7–7.7)
Neutrophils Relative %: 71 %
Platelets: 175 10*3/uL (ref 150–400)
RBC: 5.31 MIL/uL — ABNORMAL HIGH (ref 3.87–5.11)
RDW: 20.7 % — ABNORMAL HIGH (ref 11.5–15.5)
WBC: 11.7 10*3/uL — ABNORMAL HIGH (ref 4.0–10.5)
nRBC: 0 % (ref 0.0–0.2)

## 2019-07-17 LAB — PHOSPHORUS: Phosphorus: 2.6 mg/dL (ref 2.5–4.6)

## 2019-07-17 LAB — MAGNESIUM: Magnesium: 1.9 mg/dL (ref 1.7–2.4)

## 2019-07-17 MED ORDER — SODIUM CHLORIDE 0.9 % IV SOLN
1.0000 g | INTRAVENOUS | Status: DC
  Filled 2019-07-17: qty 10

## 2019-07-17 MED ORDER — ALBUMIN HUMAN 25 % IV SOLN
50.0000 g | Freq: Once | INTRAVENOUS | Status: AC
  Administered 2019-07-17: 50 g via INTRAVENOUS
  Filled 2019-07-17: qty 200

## 2019-07-17 NOTE — Progress Notes (Signed)
Anita Lame, MD [facility]   [address] [place], [place] [address] Phone: [phone] Fax : [phone]   Subjective: The patient has had no further sign of any GI bleeding.  She was found to have C. difficile colitis and is being treated with vancomycin.  There is some question whether the patient took the medication when she was discharged last admission for her C. difficile colitis.  The patient has been running a low blood pressure and her Lasix has not been restarted to help with her ascites.  The patient's paracentesis yesterday did not show any sign of SBP.   Objective: Vital signs in last 24 hours: Vitals:   07/16/19 2058 07/17/19 0508 07/17/19 0539 07/17/19 1230  BP: (!) 109/57 (!) 86/53 (!) 88/50 (!) 102/47  Pulse: 72 (!) 55 (!) 57 76  Resp: 20 20  18   Temp: 97.9 F (36.6 C) (!) 97.5 F (36.4 C)  97.8 F (36.6 C)  TempSrc: Oral Oral  Oral  SpO2: 100% 100%  96%  Weight:      Height:       Weight change:  No intake or output data in the 24 hours ending 07/17/19 1724   Exam: Heart:: Irregular rate and rhythm, S1S2 present or without murmur or extra heart sounds Lungs: normal and clear to auscultation and percussion Abdomen: Soft with a small amount of ascites without rebound without guarding   Lab Results: @LABTEST2 @ Micro Results: Recent Results (from the past 240 hour(s))  SARS CORONAVIRUS 2 (TAT 6-24 HRS) Nasopharyngeal Urine, Clean Catch     Status: None   Collection Time: 07/14/19  9:10 PM   Specimen: Urine, Clean Catch; Nasopharyngeal  Result Value Ref Range Status   SARS Coronavirus 2 NEGATIVE NEGATIVE Final    Comment: (NOTE) SARS-CoV-2 target nucleic acids are NOT DETECTED. The SARS-CoV-2 RNA is generally detectable in upper and lower respiratory specimens during the acute phase of infection. Negative results do not preclude SARS-CoV-2 infection, do not rule out co-infections with other pathogens, and should not be used as the sole basis  for treatment or other patient management decisions. Negative results must be combined with clinical observations, patient history, and epidemiological information. The expected result is Negative. Fact Sheet for Patients: SugarRoll.be Fact Sheet for Healthcare Providers: https://www.woods-mathews.com/ This test is not yet approved or cleared by the [place] FDA and  has been authorized for detection and/or diagnosis of SARS-CoV-2 by FDA under an Emergency Use Authorization (EUA). This EUA will remain  in effect (meaning this test can be used) for the duration of the COVID-19 declaration under Section 56 4(b)(1) of the Act, 21 U.S.C. section 360bbb-3(b)(1), unless the authorization is terminated or revoked sooner. Performed at [facility], [place] [address], [place], [place] [address]   C difficile quick scan w PCR reflex     Status: Abnormal   Collection Time: 07/15/19 11:40 AM   Specimen: STOOL  Result Value Ref Range Status   C Diff antigen POSITIVE (A) NEGATIVE Final   C Diff toxin POSITIVE (A) NEGATIVE Final   C Diff interpretation Toxin producing C. difficile detected.  Final    Comment: CRITICAL RESULT CALLED TO, READ BACK BY AND VERIFIED WITH: JOHNSON,E AT [id number] ON 07/15/2019 BY MOSLEY,J Performed at [facility], [address] [place], [place] [address]   Gastrointestinal Panel by PCR , Stool     Status: None   Collection Time: 07/15/19 11:40 AM   Specimen: STOOL  Result Value  Ref Range Status   Campylobacter species NOT DETECTED NOT DETECTED Final   Plesimonas shigelloides NOT DETECTED NOT DETECTED Final   Salmonella species NOT DETECTED NOT DETECTED Final   Yersinia enterocolitica NOT DETECTED NOT DETECTED Final   Vibrio species NOT DETECTED NOT DETECTED Final   Vibrio cholerae NOT DETECTED NOT DETECTED Final   Enteroaggregative E coli (EAEC) NOT DETECTED NOT DETECTED Final   Enteropathogenic E coli (EPEC)  NOT DETECTED NOT DETECTED Final   Enterotoxigenic E coli (ETEC) NOT DETECTED NOT DETECTED Final   Shiga like toxin producing E coli (STEC) NOT DETECTED NOT DETECTED Final   Shigella/Enteroinvasive E coli (EIEC) NOT DETECTED NOT DETECTED Final   Cryptosporidium NOT DETECTED NOT DETECTED Final   Cyclospora cayetanensis NOT DETECTED NOT DETECTED Final   Entamoeba histolytica NOT DETECTED NOT DETECTED Final   Giardia lamblia NOT DETECTED NOT DETECTED Final   Adenovirus F40/41 NOT DETECTED NOT DETECTED Final   Astrovirus NOT DETECTED NOT DETECTED Final   Norovirus GI/GII NOT DETECTED NOT DETECTED Final   Rotavirus A NOT DETECTED NOT DETECTED Final   Sapovirus (I, II, IV, and V) NOT DETECTED NOT DETECTED Final    Comment: Performed at [facility], [address] [place], [place] [address]  Body fluid culture     Status: None (Preliminary result)   Collection Time: 07/16/19 10:13 AM   Specimen: PATH Cytology Peritoneal fluid  Result Value Ref Range Status   Specimen Description   Final    PERITONEAL Performed at [facility], [address] [place], [place] [address]    Special Requests NONE  Final   Gram Stain NO WBC SEEN NO ORGANISMS SEEN   Final   Culture   Final    NO GROWTH < 24 HOURS Performed at [facility], [address], [place], [place] [address]    Report Status PENDING  Incomplete   Studies/Results: US Paracentesis  Result Date: 07/16/2019 INDICATION: History of cirrhosis. Recurrent abdominal distention and ascites. Request diagnostic and therapeutic paracentesis. EXAM: ULTRASOUND GUIDED RIGHT LOWER QUADRANT PARACENTESIS MEDICATIONS: None. COMPLICATIONS: None immediate. PROCEDURE: Informed written consent was obtained from the patient after a discussion of the risks, benefits and alternatives to treatment. A timeout was performed prior to the initiation of the procedure. Initial ultrasound scanning demonstrates a large amount of ascites within  the right lower abdominal quadrant. The right lower abdomen was prepped and draped in the usual sterile fashion. 1% lidocaine was used for local anesthesia. Following this, a 6 Fr Safe-T-Centesis catheter was introduced. An ultrasound image was saved for documentation purposes. The paracentesis was performed. The catheter was removed and a dressing was applied. The patient tolerated the procedure well without immediate post procedural complication. FINDINGS: A total of approximately 5.1 L of clear yellow fluid was removed. Samples were sent to the laboratory as requested by the clinical team. IMPRESSION: Successful ultrasound-guided paracentesis yielding 5.1 liters of peritoneal fluid. Read by: Ascencion Dike PA-C Electronically Signed   By: Markus Daft M.D.   On: 07/16/2019 11:24   Medications: I have reviewed the patient's current medications. Scheduled Meds: . baclofen  10 mg Oral TID  . digoxin  0.125 mg Oral Daily  . FLUoxetine  10 mg Oral Daily  . folic acid  1 mg Oral Daily  . gabapentin  100 mg Oral BID  . midodrine  5 mg Oral TID WC  . multivitamin with minerals  1 tablet Oral Daily  . pantoprazole (PROTONIX) IV  40 mg Intravenous Q12H  . potassium chloride  40 mEq Oral Once  . rOPINIRole  0.5 mg Oral QHS  . thiamine  100 mg Oral Daily  . tiZANidine  4 mg Oral QHS  . vancomycin  125 mg Oral Q6H   Continuous Infusions: . sodium chloride Stopped (07/15/19 2315)   PRN Meds:.sodium chloride, acetaminophen **OR** acetaminophen, alum & mag hydroxide-simeth, morphine injection, ondansetron **OR** ondansetron (ZOFRAN) IV, oxyCODONE, traZODone   Assessment: Active Problems:   GI bleeding   Hypotension   Hypokalemia   Bandemia   C. difficile colitis    Plan: Who is now being treated for C. difficile colitis and had lower GI bleeding which has resolved.  The lower GI bleeding was likely from the C. difficile.  The patient has been told that she needs to continue the antibiotics when  she is discharged.  Her ascites is likely to continue to accumulate while she is off the diuretics.  Due to her hypotension the diuretics cannot be restarted.  The patient continues to report that her pain and need for pain medication is her primary concern.  The patient did receive albumin today because of her recent paracentesis.  I would recommend continued antibiotic use and when her blood pressure stabilizes restarting her diuretic.   LOS: 3 days   Anita Reed 07/17/2019, 5:24 PM Pager [phone] 7am-5pm  Check AMION for 5pm -7am coverage and on weekends

## 2019-07-17 NOTE — Progress Notes (Signed)
Patient was to be seen by the [place] NP today, but is currently hospitalized. CMRN Isaias Cowman made aware.  Flo Shanks BSN, RN, [place] [phone]

## 2019-07-17 NOTE — Care Management Important Message (Signed)
Important Message  Patient Details  Name: Anita Reed MRN: [id number] Date of Birth: 07-27-1961   Medicare Important Message Given:  Yes     Beverly Sessions, RN 07/17/2019, 3:40 PM

## 2019-07-17 NOTE — Progress Notes (Signed)
Progress Note    Anita Reed  [id number] DOB: [birth year]  DOA: 07/14/2019 PCP: Hubbard Hartshorn, FNP      Brief Narrative:    Medical records reviewed and are as summarized below:  Anita Reed is an 58 y.o. female with alcoholic liver cirrhosis with ascites (recent paracentesis), history of SBP, recent C. difficile colitis, paroxysmal atrial flutter/atrial fibrillation, peripheral neuropathy, scented to the hospital with abdominal pain and rectal bleeding.  She developed acute blood loss anemia requiring 2 units of blood.      Assessment/Plan:   Active Problems:   GI bleeding   Hypotension   Hypokalemia   Bandemia   C. difficile colitis   Body mass index is 19.91 kg/m.   C. difficile colitis/leukocytosis: stool for C. difficile toxin on 07/15/2019 came back positive.  Continue oral vancomycin for at least 10 to 14 days per gastroenterologist recommendation.  Acute GI bleeding/rectal bleeding: Resolved.  This was probably due to C. difficile colitis.  Alcoholic liver cirrhosis, ascites with history of SBP/abdominal pain: s/p paracentesis on 07/16/2019 with removal of 5.7 L of ascitic fluid.  No evidence of SBP.  Acute blood loss anemia: s/p transfusion with 2 units of packed red blood cells on 07/15/2019.  H&H is stable  Hypotension: Asymptomatic.  Give IV albumin given recent paracentesis.  Monitor blood pressure closely  Hypokalemia and hypomagnesemia: Improved.  Paroxysmal atrial fibrillation/atrial flutter: She is not on anticoagulation because of high bleeding risk.  Suspected 5.5 cm exophytic mass on right hepatic lobe: This was not present on MRI of the liver.  Peripheral neuropathy: Analgesics as needed for pain.   Family Communication/Anticipated D/C date and plan/Code Status   DVT prophylaxis: SCDs Code Status: Full code Family Communication: Plan discussed with the patient Disposition Plan: Possible discharge to home tomorrow if she  continues to improve      Subjective:   C/o diarrhea and she describes stools as mushy.  No rectal bleeding.  She still has some abdominal pain  Objective:    Vitals:   07/16/19 2058 07/17/19 0508 07/17/19 0539 07/17/19 1230  BP: (!) 109/57 (!) 86/53 (!) 88/50 (!) 102/47  Pulse: 72 (!) 55 (!) 57 76  Resp: 20 20  18   Temp: 97.9 F (36.6 C) (!) 97.5 F (36.4 C)  97.8 F (36.6 C)  TempSrc: Oral Oral  Oral  SpO2: 100% 100%  96%  Weight:      Height:       No intake or output data in the 24 hours ending 07/17/19 1321 Filed Weights   07/14/19 1554 07/16/19 1347  Weight: 59 kg 59.4 kg    Exam:  GEN: NAD SKIN: No rash EYES: No pallor, anicteric ENT: MMM CV: RRR PULM: CTA B ABD: soft, mild distension (distention improved after paracentesis), mild umbilical tenderness, +BS CNS: AAO x 3, non focal EXT: b/l leg edema, no tenderness   Data Reviewed:   I have personally reviewed following labs and imaging studies:  Labs: Labs show the following:   Basic Metabolic Panel: Recent Labs  Lab 07/14/19 1754 07/15/19 0433 07/15/19 1758 07/16/19 0629 07/17/19 0820  NA 138 136  --  139 136  K 2.6* 3.1*  --  4.2 4.0  CL 102 103  --  105 105  CO2 22 25  --  27 22  GLUCOSE 96 98  --  96 86  BUN 8 8  --  <5* 7  CREATININE 0.58 0.54  --  0.41* 0.49  CALCIUM 7.8* 7.7*  --  8.6* 8.7*  MG  --  1.4* 2.2 2.1 1.9  PHOS  --   --   --   --  2.6   GFR Estimated Creatinine Clearance: 72.8 mL/min (by C-G formula based on SCr of 0.49 mg/dL). Liver Function Tests: Recent Labs  Lab 07/14/19 1754  AST 57*   57*  ALT 20   20  ALKPHOS 354*   361*  BILITOT 2.1*   2.1*  PROT 5.8*   5.8*  ALBUMIN 3.3*   3.3*   Recent Labs  Lab 07/14/19 1754  LIPASE 37   No results for input(s): AMMONIA in the last 168 hours. Coagulation profile Recent Labs  Lab 07/14/19 1754  INR 1.2    CBC: Recent Labs  Lab 07/14/19 1603 07/15/19 0433 07/15/19 0911 07/15/19 1542 07/16/19 0629  07/17/19 0820  WBC 16.8* 18.8*  --   --  19.3* 11.7*  NEUTROABS  --   --   --   --  14.8* 8.3*  HGB 12.7 9.1* 12.3 13.2 12.0 13.5  HCT 39.5 28.1* 40.1 41.6 38.7 42.6  MCV 75.7* 77.6*  --   --  82.3 80.2  PLT 279 187  --   --  185 175   Cardiac Enzymes: No results for input(s): CKTOTAL, CKMB, CKMBINDEX, TROPONINI in the last 168 hours. BNP (last 3 results) No results for input(s): PROBNP in the last 8760 hours. CBG: No results for input(s): GLUCAP in the last 168 hours. D-Dimer: No results for input(s): DDIMER in the last 72 hours. Hgb A1c: No results for input(s): HGBA1C in the last 72 hours. Lipid Profile: No results for input(s): CHOL, HDL, LDLCALC, TRIG, CHOLHDL, LDLDIRECT in the last 72 hours. Thyroid function studies: No results for input(s): TSH, T4TOTAL, T3FREE, THYROIDAB in the last 72 hours.  Invalid input(s): FREET3 Anemia work up: No results for input(s): VITAMINB12, FOLATE, FERRITIN, TIBC, IRON, RETICCTPCT in the last 72 hours. Sepsis Labs: Recent Labs  Lab 07/14/19 1603 07/15/19 0433 07/16/19 0629 07/17/19 0820  WBC 16.8* 18.8* 19.3* 11.7*    Microbiology Recent Results (from the past 240 hour(s))  SARS CORONAVIRUS 2 (TAT 6-24 HRS) Nasopharyngeal Urine, Clean Catch     Status: None   Collection Time: 07/14/19  9:10 PM   Specimen: Urine, Clean Catch; Nasopharyngeal  Result Value Ref Range Status   SARS Coronavirus 2 NEGATIVE NEGATIVE Final    Comment: (NOTE) SARS-CoV-2 target nucleic acids are NOT DETECTED. The SARS-CoV-2 RNA is generally detectable in upper and lower respiratory specimens during the acute phase of infection. Negative results do not preclude SARS-CoV-2 infection, do not rule out co-infections with other pathogens, and should not be used as the sole basis for treatment or other patient management decisions. Negative results must be combined with clinical observations, patient history, and epidemiological information. The expected result  is Negative. Fact Sheet for Patients: SugarRoll.be Fact Sheet for Healthcare Providers: https://www.woods-mathews.com/ This test is not yet approved or cleared by the [place] FDA and  has been authorized for detection and/or diagnosis of SARS-CoV-2 by FDA under an Emergency Use Authorization (EUA). This EUA will remain  in effect (meaning this test can be used) for the duration of the COVID-19 declaration under Section 56 4(b)(1) of the Act, 21 U.S.C. section 360bbb-3(b)(1), unless the authorization is terminated or revoked sooner. Performed at [facility], [address] [place], [place] [address]   C difficile quick scan w PCR  reflex     Status: Abnormal   Collection Time: 07/15/19 11:40 AM   Specimen: STOOL  Result Value Ref Range Status   C Diff antigen POSITIVE (A) NEGATIVE Final   C Diff toxin POSITIVE (A) NEGATIVE Final   C Diff interpretation Toxin producing C. difficile detected.  Final    Comment: CRITICAL RESULT CALLED TO, READ BACK BY AND VERIFIED WITH: JOHNSON,E AT [id number] ON 07/15/2019 BY MOSLEY,J Performed at [facility], [address] [place], [place] [address]   Gastrointestinal Panel by PCR , Stool     Status: None   Collection Time: 07/15/19 11:40 AM   Specimen: STOOL  Result Value Ref Range Status   Campylobacter species NOT DETECTED NOT DETECTED Final   Plesimonas shigelloides NOT DETECTED NOT DETECTED Final   Salmonella species NOT DETECTED NOT DETECTED Final   Yersinia enterocolitica NOT DETECTED NOT DETECTED Final   Vibrio species NOT DETECTED NOT DETECTED Final   Vibrio cholerae NOT DETECTED NOT DETECTED Final   Enteroaggregative E coli (EAEC) NOT DETECTED NOT DETECTED Final   Enteropathogenic E coli (EPEC) NOT DETECTED NOT DETECTED Final   Enterotoxigenic E coli (ETEC) NOT DETECTED NOT DETECTED Final   Shiga like toxin producing E coli (STEC) NOT DETECTED NOT DETECTED Final    Shigella/Enteroinvasive E coli (EIEC) NOT DETECTED NOT DETECTED Final   Cryptosporidium NOT DETECTED NOT DETECTED Final   Cyclospora cayetanensis NOT DETECTED NOT DETECTED Final   Entamoeba histolytica NOT DETECTED NOT DETECTED Final   Giardia lamblia NOT DETECTED NOT DETECTED Final   Adenovirus F40/41 NOT DETECTED NOT DETECTED Final   Astrovirus NOT DETECTED NOT DETECTED Final   Norovirus GI/GII NOT DETECTED NOT DETECTED Final   Rotavirus A NOT DETECTED NOT DETECTED Final   Sapovirus (I, II, IV, and V) NOT DETECTED NOT DETECTED Final    Comment: Performed at [facility], [address] [place], [place] [address]  Body fluid culture     Status: None (Preliminary result)   Collection Time: 07/16/19 10:13 AM   Specimen: PATH Cytology Peritoneal fluid  Result Value Ref Range Status   Specimen Description   Final    PERITONEAL Performed at [facility], [address] [place], [place] [address]    Special Requests NONE  Final   Gram Stain NO WBC SEEN NO ORGANISMS SEEN   Final   Culture   Final    NO GROWTH < 24 HOURS Performed at [facility], [address], [place],  [address]    Report Status PENDING  Incomplete    Procedures and diagnostic studies:  US Paracentesis  Result Date: 07/16/2019 INDICATION: History of cirrhosis. Recurrent abdominal distention and ascites. Request diagnostic and therapeutic paracentesis. EXAM: ULTRASOUND GUIDED RIGHT LOWER QUADRANT PARACENTESIS MEDICATIONS: None. COMPLICATIONS: None immediate. PROCEDURE: Informed written consent was obtained from the patient after a discussion of the risks, benefits and alternatives to treatment. A timeout was performed prior to the initiation of the procedure. Initial ultrasound scanning demonstrates a large amount of ascites within the right lower abdominal quadrant. The right lower abdomen was prepped and draped in the usual sterile fashion. 1% lidocaine was used for local  anesthesia. Following this, a 6 Fr Safe-T-Centesis catheter was introduced. An ultrasound image was saved for documentation purposes. The paracentesis was performed. The catheter was removed and a dressing was applied. The patient tolerated the procedure well without immediate post procedural complication. FINDINGS: A total of approximately 5.1 L of clear yellow fluid  was removed. Samples were sent to the laboratory as requested by the clinical team. IMPRESSION: Successful ultrasound-guided paracentesis yielding 5.1 liters of peritoneal fluid. Read by: Ascencion Dike PA-C Electronically Signed   By: Markus Daft M.D.   On: 07/16/2019 11:24    Medications:    baclofen  10 mg Oral TID   digoxin  0.125 mg Oral Daily   FLUoxetine  10 mg Oral Daily   folic acid  1 mg Oral Daily   gabapentin  100 mg Oral BID   midodrine  5 mg Oral TID WC   multivitamin with minerals  1 tablet Oral Daily   pantoprazole (PROTONIX) IV  40 mg Intravenous Q12H   potassium chloride  40 mEq Oral Once   rOPINIRole  0.5 mg Oral QHS   thiamine  100 mg Oral Daily   tiZANidine  4 mg Oral QHS   vancomycin  125 mg Oral Q6H   Continuous Infusions:  sodium chloride Stopped (07/15/19 2315)     LOS: 3 days   Mak[place]eli Hoard  [facility] Pager [phone].   *Please refer to [email or web address], password [email or web address] to get updated schedule on who will round on this patient, as hospitalists switch teams weekly. If 7PM-7AM, please contact night-coverage at [email or web address], password [email or web address] for any overnight needs.  07/17/2019, 1:21 PM

## 2019-07-18 MED ORDER — OXYCODONE HCL 5 MG PO TABS: 5.0000 mg | ORAL_TABLET | Freq: Three times a day (TID) | ORAL | 0 refills | Status: AC | PRN

## 2019-07-18 MED ORDER — TIZANIDINE HCL 4 MG PO CAPS: 4.0000 mg | ORAL_CAPSULE | Freq: Two times a day (BID) | ORAL | 0 refills | Status: DC

## 2019-07-18 MED ORDER — VANCOMYCIN HCL 125 MG PO CAPS: 125.0000 mg | ORAL_CAPSULE | Freq: Four times a day (QID) | ORAL | 0 refills | Status: DC

## 2019-07-18 NOTE — TOC Transition Note (Signed)
Transition of Care [facility]) - CM/SW Discharge Note   Patient Details  Name: Charlestine Skehan MRN: [id number] Date of Birth: [birth year]  Transition of Care [facility] CM/SW Contact:  Trecia Rogers, LCSW Phone Number: 07/18/2019, 1:28 PM   Clinical Narrative:     Patient is discharging home today. [facility]) was notified of discharge. Patient to discharge to discharge on oral VANC. Confirmed with patient that she does not have prescription insurance coverage. Confirmed price with [facility] ($124.75) at [facility]. Patient confirmed that she will be able to obtain/afford medication upon discharge.    Final next level of care: [place] Barriers to Discharge: No Barriers Identified   Patient Goals and CMS Choice     Choice offered to / list presented to : Patient  Discharge Placement                       Discharge Plan and Services   Discharge Planning Services: CM Consult Post Acute Care Choice: Resumption of Svcs/PTA Provider, Home Health                    HH Arranged: RN, PT, OT, Nurse's Aide, Social Work [employer] Agency: [place] ([place]) Date [place]: 07/18/19 Time [place]: [id number] Representative spoke with at [place]: [place] ([place]) Interventions     Readmission Risk Interventions Readmission Risk Prevention Plan 07/18/2019 07/15/2019  Transportation Screening Complete Complete  PCP or Specialist Appt within 3-5 Days - Complete  HRI or [place] - Complete  Social Work Consult for [place] Planning/Counseling - Patient refused  Palliative Care Screening - Complete  Medication Review Press photographer) Complete Complete  PCP or Specialist appointment within 3-5 days of discharge (No Data) -  [place] or Home Care Consult Complete -  Palliative Care Screening Complete -  [employer] Not Applicable -  Some recent data might be hidden

## 2019-07-18 NOTE — Discharge Summary (Signed)
Physician Discharge Summary  Anita Reed [id number] DOB: [birth year] DOA: 07/14/2019  PCP: Hubbard Hartshorn, FNP  Admit date: 07/14/2019 Discharge date: 07/18/2019  Admitted From: Home  Disposition:  Home   Recommendations for Outpatient Follow-up:  1. Follow up with PCP in 1-2 weeks 2. Follow up with Dr. Marius Ditch in 1 week 3. Dr. Marius Ditch: Patient discharged with 10 days oral vancomycin, please extend course to taper if you feel appropriate   Home Health: Yes  Equipment/Devices: None  Discharge Condition: Fair CODE STATUS: FULL Diet recommendation: Regular  Brief/Interim Summary: Anita Reed is a 58 y.o. F with alcoholic cirrhosis c/b ascites, hx Cdiff colitis, hx GIB who presented with 1 day BRBPR and abdominal pain.  In the ER, Hgb 12.7.  INR 1.2.  CT showed a liver mass, no other acute findings.  She was admitted, started on empiric pantoprazole and octreotide and GI were consulted.        PRINCIPAL HOSPITAL DIAGNOSIS: C diff colitis    Discharge Diagnoses:    Acute lower GI bleed due to C. difficile colitis Patient admitted and started on empiric oral vancomycin.  Her C. difficile testing was positive, and GI felt that her acute bleeding, was a result of her C. difficile infection.    Indeed, her bleeding resolved with oral vancomycin, her stools became semisolid, and her abdominal pain improved.  She was discharged to complete 14 days oral vancomycin   Acute blood loss anemia Hemoglobin 12.7 on admission, nadir was 9.1 g/dL.  Given 2 units blood transfusion.   No further bleeding.  Cirrhosis with ascites Underwent paracentesis 11/16.  SBP was ruled out.  Paroxysmal atrial fibrillation  Chronic hypotension  Hypokalemia Hypomagnesemia Resolved  Hepatic mass, ruled out 5.5 cm exophytic mass on the right hepatic lobe noted on admission CT.  Follow-up MRI of the liver was obtained which showed no exophytic masses, no hepatoma, but morphologic features  compatible with cirrhosis with portal venous hypertension.  Depression  Chronic pain               Discharge Instructions  Discharge Instructions    Discharge instructions   Complete by: As directed    Take vancomycin 125 mg capsule four times daily for 10 days Dr. Marius Ditch may extend this prescription for several more weeks, call her to check  Take oxycodone for pain. Do not drive while taking this medicine  Get an appointment with Dr. Marius Ditch as soon as possible Ask her office to refer you to the transplant center again  Call your primary care doctor as soon as possible for an appointment, have her check your lab work  Take your midodrine as you have been Resume your Lasix 1/2 tablet once daily Do not take your spironolactone until you see Dr. Marius Ditch Do not take metoprolol until you see Dr. Marius Ditch, and ask her about whether you should restart both of these.   Increase activity slowly   Complete by: As directed      Allergies as of 07/18/2019   No Known Allergies     Medication List    STOP taking these medications   metoprolol succinate 25 MG 24 hr tablet Commonly known as: TOPROL-XL   pantoprazole 40 MG tablet Commonly known as: Protonix   spironolactone 50 MG tablet Commonly known as: ALDACTONE     TAKE these medications   baclofen 10 MG tablet Commonly known as: LIORESAL Take 1 tablet (10 mg total) by mouth 3 (three) times daily. Muscle Relaxer  digoxin 0.125 MG tablet Commonly known as: LANOXIN Take 1 tablet (0.125 mg total) by mouth daily.   FLUoxetine 10 MG capsule Commonly known as: PROZAC Take 1 capsule (10 mg total) by mouth daily.   furosemide 40 MG tablet Commonly known as: LASIX Take 0.5 tablets (20 mg total) by mouth daily.   gabapentin 100 MG capsule Commonly known as: NEURONTIN Take 1 capsule (100 mg total) by mouth 2 (two) times daily.   midodrine 5 MG tablet Commonly known as: PROAMATINE Take 1 tablet (5 mg total) by mouth  3 (three) times daily with meals.   oxyCODONE 5 MG immediate release tablet Commonly known as: Oxy IR/ROXICODONE Take 1 tablet (5 mg total) by mouth every 8 (eight) hours as needed for up to 5 days for moderate pain.   tiZANidine 4 MG capsule Commonly known as: ZANAFLEX Take 1 capsule (4 mg total) by mouth 2 (two) times daily.   vancomycin 125 MG capsule Commonly known as: VANCOCIN Take 1 capsule (125 mg total) by mouth 4 (four) times daily.      Follow-up Information    Hubbard Hartshorn, FNP. Go on 07/25/2019.   Specialty: Family Medicine Why: 11:20am appointment Contact information: [address] [place] [address] [phone]        [name], MD. Go on 07/30/2019.   Specialty: Gastroenterology Why: 1pm appointment Contact information: [place] [address] [phone]          No Known Allergies  Consultations:  Gastroenterology   Procedures/Studies: Ct Abdomen Pelvis W Contrast  Result Date: 07/14/2019 CLINICAL DATA:  Abdominal pain, bloody stools EXAM: CT ABDOMEN AND PELVIS WITH CONTRAST TECHNIQUE: Multidetector CT imaging of the abdomen and pelvis was performed using the standard protocol following bolus administration of intravenous contrast. CONTRAST:  181mL OMNIPAQUE IOHEXOL 300 MG/ML  SOLN COMPARISON:  06/23/2019 FINDINGS: Lower chest: No acute abnormality Hepatobiliary: Nodular contours of the liver compatible with cirrhosis. Heterogeneous enhancement. Concern for possible mass in the inferior right hepatic lobe versus regenerating nodule measuring 5.5 cm. No biliary ductal dilatation. Gallbladder is distended. Pancreas: No focal abnormality or ductal dilatation. Spleen: Splenomegaly with a craniocaudal length of 14.4 cm. Adrenals/Urinary Tract: No adrenal abnormality. No focal renal abnormality. No stones or hydronephrosis. Urinary bladder is unremarkable. Stomach/Bowel: Stomach, large and small bowel  grossly unremarkable. Vascular/Lymphatic: Aortic atherosclerosis. No enlarged abdominal or pelvic lymph nodes. Reproductive: Prior hysterectomy.  No adnexal masses. Other: Large volume ascites in the abdomen and pelvis. Musculoskeletal: No acute bony abnormality. IMPRESSION: Changes of cirrhosis with splenomegaly and large volume ascites. 5.5 cm exophytic appearing masslike area off the inferior medial right hepatic lobe. This could reflect regenerating nodule, but recommend further evaluation with elective MRI of the liver to exclude hepatocellular carcinoma. Aortic atherosclerosis. Electronically Signed   By: Rolm Baptise M.D.   On: 07/14/2019 19:35   Ct Abdomen Pelvis W Contrast  Result Date: 06/23/2019 CLINICAL DATA:  58 year old female with abdominal pain. EXAM: CT ABDOMEN AND PELVIS WITH CONTRAST TECHNIQUE: Multidetector CT imaging of the abdomen and pelvis was performed using the standard protocol following bolus administration of intravenous contrast. CONTRAST:  180mL OMNIPAQUE IOHEXOL 300 MG/ML  SOLN COMPARISON:  CT of the abdomen pelvis dated 06/04/2019 FINDINGS: Lower chest: There is a small right pleural effusion with right lung base atelectasis or infiltrate. Clinical correlation is recommended. There is eventration of the right hemidiaphragm similar to prior CT. Coronary vascular calcifications noted. No intra-abdominal free air. Moderate  ascites, increased since the prior CT. Hepatobiliary: Cirrhosis. No intrahepatic biliary ductal dilatation. No calcified gallstone. Pancreas: Unremarkable. No pancreatic ductal dilatation or surrounding inflammatory changes. Spleen: Normal in size without focal abnormality. Adrenals/Urinary Tract: The adrenal glands are unremarkable. There is no hydronephrosis on either side. There is symmetric enhancement and excretion of contrast by both kidneys. The urinary bladder is unremarkable. Stomach/Bowel: There is loose stool throughout the colon. There is a small  hiatal hernia. No bowel obstruction. Appendectomy. Vascular/Lymphatic: Advanced aortoiliac atherosclerotic disease. The IVC is unremarkable. The SMV, splenic vein, and main portal vein are patent. No portal venous gas. Paraesophageal and gastric varices noted. There is recanalization of the paraumbilical vein. Reproductive: Hysterectomy. Other: Diffuse subcutaneous edema and anasarca, increased since the prior CT. Musculoskeletal: No acute or significant osseous findings. IMPRESSION: 1. Small right pleural effusion with right lung base atelectasis or infiltrate. Clinical correlation is recommended. 2. Decompensated cirrhosis. 3. Moderate ascites, increased since the prior CT. 4. No bowel obstruction. Aortic Atherosclerosis (ICD10-I70.0). Electronically Signed   By: Anner Crete M.D.   On: 06/23/2019 02:09   Mr Liver W [id number] Contrast  Result Date: 07/15/2019 CLINICAL DATA:  Abdominal pain.  Cirrhosis.  Evaluate liver lesion. EXAM: MRI ABDOMEN WITHOUT AND WITH CONTRAST TECHNIQUE: Multiplanar multisequence MR imaging of the abdomen was performed both before and after the administration of intravenous contrast. CONTRAST:  4mL GADAVIST GADOBUTROL 1 MMOL/ML IV SOLN COMPARISON:  CT AP 07/14/2019 FINDINGS: Lower chest: No acute findings. Hepatobiliary: The liver is cirrhotic with hypertrophy of the caudate lobe and lateral segment of left lobe. Contour the liver is lobular. Diffusely heterogeneous enhancement is identified throughout both lobes. No focal areas of masslike enhancement identified. On the delayed images there are no abnormal areas of washout specific for HCC. Moderate distension of the gallbladder. No gallstones, gallbladder wall thickening. There is increase caliber of the CBD which measures 1.1 cm in maximum thickness. No choledocholithiasis or obstructing mass. Pancreas: No mass, inflammatory changes, or other parenchymal abnormality identified. Spleen:  The spleen is enlarged measuring 13.7 cm in  length. Adrenals/Urinary Tract: Normal appearance of the adrenal glands. No kidney mass or hydronephrosis identified. Stomach/Bowel: Visualized portions within the abdomen are unremarkable. Vascular/Lymphatic: Aortic atherosclerosis without aneurysm. Small esophageal varices. Left upper quadrant varices also noted. No adenopathy. Other:  There is a large volume of ascites within the abdomen. Musculoskeletal: No suspicious bone lesions identified. IMPRESSION: 1. Morphologic features of liver compatible with cirrhosis. No specific findings identified to suggest hepatoma. 2. Stigmata of portal venous hypertension including splenomegaly, esophageal varices and ascites. Electronically Signed   By: Kerby Moors M.D.   On: 07/15/2019 04:16   US Venous Img Lower Bilateral  Result Date: 06/23/2019 CLINICAL DATA:  Bilateral lower extremity edema EXAM: BILATERAL LOWER EXTREMITY VENOUS DOPPLER ULTRASOUND TECHNIQUE: Gray-scale sonography with graded compression, as well as color Doppler and duplex ultrasound were performed to evaluate the lower extremity deep venous systems from the level of the common femoral vein and including the common femoral, femoral, profunda femoral, popliteal and calf veins including the posterior tibial, peroneal and gastrocnemius veins when visible. The superficial great saphenous vein was also interrogated. Spectral Doppler was utilized to evaluate flow at rest and with distal augmentation maneuvers in the common femoral, femoral and popliteal veins. COMPARISON:  None. FINDINGS: RIGHT LOWER EXTREMITY Common Femoral Vein: No evidence of thrombus. Normal compressibility, respiratory phasicity and response to augmentation. Saphenofemoral Junction: No evidence of thrombus. Normal compressibility and flow on color Doppler imaging. Profunda  Femoral Vein: No evidence of thrombus. Normal compressibility and flow on color Doppler imaging. Femoral Vein: No evidence of thrombus. Normal compressibility,  respiratory phasicity and response to augmentation. Popliteal Vein: No evidence of thrombus. Normal compressibility, respiratory phasicity and response to augmentation. Calf Veins: No evidence of thrombus. Normal compressibility and flow on color Doppler imaging. Other Findings:  Peripheral subcutaneous edema noted LEFT LOWER EXTREMITY Common Femoral Vein: No evidence of thrombus. Normal compressibility, respiratory phasicity and response to augmentation. Saphenofemoral Junction: No evidence of thrombus. Normal compressibility and flow on color Doppler imaging. Profunda Femoral Vein: No evidence of thrombus. Normal compressibility and flow on color Doppler imaging. Femoral Vein: No evidence of thrombus. Normal compressibility, respiratory phasicity and response to augmentation. Popliteal Vein: No evidence of thrombus. Normal compressibility, respiratory phasicity and response to augmentation. Calf Veins: No evidence of thrombus. Normal compressibility and flow on color Doppler imaging. Other Findings:  Peripheral edema noted IMPRESSION: No evidence of deep venous thrombosis in either lower extremity. Electronically Signed   By: Jerilynn Mages.  Shick M.D.   On: 06/23/2019 11:07   US Paracentesis  Result Date: 07/16/2019 INDICATION: History of cirrhosis. Recurrent abdominal distention and ascites. Request diagnostic and therapeutic paracentesis. EXAM: ULTRASOUND GUIDED RIGHT LOWER QUADRANT PARACENTESIS MEDICATIONS: None. COMPLICATIONS: None immediate. PROCEDURE: Informed written consent was obtained from the patient after a discussion of the risks, benefits and alternatives to treatment. A timeout was performed prior to the initiation of the procedure. Initial ultrasound scanning demonstrates a large amount of ascites within the right lower abdominal quadrant. The right lower abdomen was prepped and draped in the usual sterile fashion. 1% lidocaine was used for local anesthesia. Following this, a 6 Fr Safe-T-Centesis  catheter was introduced. An ultrasound image was saved for documentation purposes. The paracentesis was performed. The catheter was removed and a dressing was applied. The patient tolerated the procedure well without immediate post procedural complication. FINDINGS: A total of approximately 5.1 L of clear yellow fluid was removed. Samples were sent to the laboratory as requested by the clinical team. IMPRESSION: Successful ultrasound-guided paracentesis yielding 5.1 liters of peritoneal fluid. Read by: Ascencion Dike PA-C Electronically Signed   By: Markus Daft M.D.   On: 07/16/2019 11:24   US Paracentesis  Result Date: 07/12/2019 INDICATION: 58 year old with decompensated hepatic cirrhosis. Recurrent ascites. EXAM: ULTRASOUND GUIDED PARACENTESIS MEDICATIONS: None. COMPLICATIONS: None immediate. PROCEDURE: Informed written consent was obtained from the patient after a discussion of the risks, benefits and alternatives to treatment. A timeout was performed prior to the initiation of the procedure. Initial ultrasound scanning demonstrates a large amount of ascites within the right lower abdominal quadrant. The right lower abdomen was prepped and draped in the usual sterile fashion. 1% lidocaine was used for local anesthesia. Following this, a 6 Fr Safe-T-Centesis catheter was introduced. An ultrasound image was saved for documentation purposes. The paracentesis was performed. The catheter was removed and a dressing was applied. The patient tolerated the procedure well without immediate post procedural complication. Patient received post-procedure intravenous albumin; see nursing notes for details. FINDINGS: A total of approximately 5.7 L of amber fluid was removed. IMPRESSION: Successful ultrasound-guided paracentesis yielding 5.7 liters of peritoneal fluid. Electronically Signed   By: Markus Daft M.D.   On: 07/12/2019 16:27   US Paracentesis  Result Date: 06/27/2019 INDICATION: 58 year old with decompensated  cirrhosis and ascites. EXAM: ULTRASOUND GUIDED PARACENTESIS MEDICATIONS: None. COMPLICATIONS: None immediate. PROCEDURE: Informed written consent was obtained from the patient after a discussion of the risks, benefits and  alternatives to treatment. A timeout was performed prior to the initiation of the procedure. Initial ultrasound scanning demonstrates a large amount of ascites within the right lower abdominal quadrant. The right lower abdomen was prepped and draped in the usual sterile fashion. 1% lidocaine was used for local anesthesia. Following this, a 6 Fr Safe-T-Centesis catheter was introduced. An ultrasound image was saved for documentation purposes. The paracentesis was performed. The catheter was removed and a dressing was applied. The patient tolerated the procedure well without immediate post procedural complication. FINDINGS: A total of approximately 3.9 L of yellow fluid was removed. IMPRESSION: Successful ultrasound-guided paracentesis yielding 3.9 liters of peritoneal fluid. Electronically Signed   By: Markus Daft M.D.   On: 06/27/2019 07:52   Dg Chest Port 1 View  Result Date: 06/23/2019 CLINICAL DATA:  58 year old female with sepsis. EXAM: PORTABLE CHEST 1 VIEW COMPARISON:  Chest radiograph dated 06/03/2019 FINDINGS: There is mild eventration of the right hemidiaphragm. There is a small right pleural effusion with right lung base atelectasis or infiltrate. An area of hazy density in the right apical subpleural region appears similar to prior radiograph and may represent scarring or developing infiltrate. Clinical correlation is recommended. Or the left lung remains clear. No pneumothorax. Stable cardiac silhouette. No acute osseous pathology. IMPRESSION: 1. Small right pleural effusion with right lung base atelectasis or infiltrate. 2. Stable area of hazy density in the right apical subpleural region. Clinical correlation is recommended to exclude developing infiltrate. Electronically Signed    By: Anner Crete M.D.   On: 06/23/2019 03:28      Subjective: Patient is feeling well.  Her abdominal pain is improved, but still present.  She has known vomiting.  She has had 3 semiformed stools today.  Discharge Exam: Vitals:   07/18/19 1000 07/18/19 1303  BP: (!) 82/72 123/76  Pulse: 75 86  Resp: 20   Temp: 97.9 F (36.6 C)   SpO2: 92%    Vitals:   07/17/19 2017 07/18/19 0453 07/18/19 1000 07/18/19 1303  BP: 120/71 102/61 (!) 82/72 123/76  Pulse: 85 60 75 86  Resp: 17 (!) 22 20   Temp: 97.8 F (36.6 C) 97.8 F (36.6 C) 97.9 F (36.6 C)   TempSrc: Oral Oral Oral   SpO2: 95% 92% 92%   Weight:      Height:        General: Pt is alert, awake, not in acute distress Cardiovascular: RRR, nl S1-S2, no murmurs appreciated.   No LE edema.   Respiratory: Normal respiratory rate and rhythm.  CTAB without rales or wheezes. Abdominal: Abdomen soft and with moderate nonfocal tenderness to palpation, voluntary guarding.  No distension or HSM.   Neuro/Psych: Strength symmetric in upper and lower extremities.  Judgment and insight appear normal.   The results of significant diagnostics from this hospitalization (including imaging, microbiology, ancillary and laboratory) are listed below for reference.     Microbiology: Recent Results (from the past 240 hour(s))  SARS CORONAVIRUS 2 (TAT 6-24 HRS) Nasopharyngeal Urine, Clean Catch     Status: None   Collection Time: 07/14/19  9:10 PM   Specimen: Urine, Clean Catch; Nasopharyngeal  Result Value Ref Range Status   SARS Coronavirus 2 NEGATIVE NEGATIVE Final    Comment: (NOTE) SARS-CoV-2 target nucleic acids are NOT DETECTED. The SARS-CoV-2 RNA is generally detectable in upper and lower respiratory specimens during the acute phase of infection. Negative results do not preclude SARS-CoV-2 infection, do not rule out co-infections with other  pathogens, and should not be used as the sole basis for treatment or other patient  management decisions. Negative results must be combined with clinical observations, patient history, and epidemiological information. The expected result is Negative. Fact Sheet for Patients: SugarRoll.be Fact Sheet for Healthcare Providers: https://www.woods-mathews.com/ This test is not yet approved or cleared by the [place] FDA and  has been authorized for detection and/or diagnosis of SARS-CoV-2 by FDA under an Emergency Use Authorization (EUA). This EUA will remain  in effect (meaning this test can be used) for the duration of the COVID-19 declaration under Section 56 4(b)(1) of the Act, 21 U.S.C. section 360bbb-3(b)(1), unless the authorization is terminated or revoked sooner. Performed at [facility], [place] [address] [place], [place] [address]   C difficile quick scan w PCR reflex     Status: Abnormal   Collection Time: 07/15/19 11:40 AM   Specimen: STOOL  Result Value Ref Range Status   C Diff antigen POSITIVE (A) NEGATIVE Final   C Diff toxin POSITIVE (A) NEGATIVE Final   C Diff interpretation Toxin producing C. difficile detected.  Final    Comment: CRITICAL RESULT CALLED TO, READ BACK BY AND VERIFIED WITH: JOHNSON,E AT [id number] ON 07/15/2019 BY MOSLEY,J Performed at [facility], [address] [place], [place] [address]   Gastrointestinal Panel by PCR , Stool     Status: None   Collection Time: 07/15/19 11:40 AM   Specimen: STOOL  Result Value Ref Range Status   Campylobacter species NOT DETECTED NOT DETECTED Final   Plesimonas shigelloides NOT DETECTED NOT DETECTED Final   Salmonella species NOT DETECTED NOT DETECTED Final   Yersinia enterocolitica NOT DETECTED NOT DETECTED Final   Vibrio species NOT DETECTED NOT DETECTED Final   Vibrio cholerae NOT DETECTED NOT DETECTED Final   Enteroaggregative E coli (EAEC) NOT DETECTED NOT DETECTED Final   Enteropathogenic E coli (EPEC) NOT DETECTED NOT DETECTED  Final   Enterotoxigenic E coli (ETEC) NOT DETECTED NOT DETECTED Final   Shiga like toxin producing E coli (STEC) NOT DETECTED NOT DETECTED Final   Shigella/Enteroinvasive E coli (EIEC) NOT DETECTED NOT DETECTED Final   Cryptosporidium NOT DETECTED NOT DETECTED Final   Cyclospora cayetanensis NOT DETECTED NOT DETECTED Final   Entamoeba histolytica NOT DETECTED NOT DETECTED Final   Giardia lamblia NOT DETECTED NOT DETECTED Final   Adenovirus F40/41 NOT DETECTED NOT DETECTED Final   Astrovirus NOT DETECTED NOT DETECTED Final   Norovirus GI/GII NOT DETECTED NOT DETECTED Final   Rotavirus A NOT DETECTED NOT DETECTED Final   Sapovirus (I, II, IV, and V) NOT DETECTED NOT DETECTED Final    Comment: Performed at [facility], [address] [place], [place] [address]  Body fluid culture     Status: None (Preliminary result)   Collection Time: 07/16/19 10:13 AM   Specimen: PATH Cytology Peritoneal fluid  Result Value Ref Range Status   Specimen Description   Final    PERITONEAL Performed at [facility], [address] [place], [place] [address]    Special Requests NONE  Final   Gram Stain NO WBC SEEN NO ORGANISMS SEEN   Final   Culture   Final    NO GROWTH 2 DAYS Performed at [facility], [address] [place],  [address]    Report Status PENDING  Incomplete     Labs: BNP (last 3 results) Recent Labs    01/18/19 1220  BNP 771.0*  Basic Metabolic Panel: Recent Labs  Lab 07/14/19 1754 07/15/19 0433 07/15/19 1758 07/16/19 0629 07/17/19 0820  NA 138 136  --  139 136  K 2.6* 3.1*  --  4.2 4.0  CL 102 103  --  105 105  CO2 22 25  --  27 22  GLUCOSE 96 98  --  96 86  BUN 8 8  --  <5* 7  CREATININE 0.58 0.54  --  0.41* 0.49  CALCIUM 7.8* 7.7*  --  8.6* 8.7*  MG  --  1.4* 2.2 2.1 1.9  PHOS  --   --   --   --  2.6   Liver Function Tests: Recent Labs  Lab 07/14/19 1754  AST 57*  57*  ALT 20  20  ALKPHOS 354*  361*  BILITOT 2.1*   2.1*  PROT 5.8*  5.8*  ALBUMIN 3.3*  3.3*   Recent Labs  Lab 07/14/19 1754  LIPASE 37   No results for input(s): AMMONIA in the last 168 hours. CBC: Recent Labs  Lab 07/14/19 1603 07/15/19 0433 07/15/19 0911 07/15/19 1542 07/16/19 0629 07/17/19 0820  WBC 16.8* 18.8*  --   --  19.3* 11.7*  NEUTROABS  --   --   --   --  14.8* 8.3*  HGB 12.7 9.1* 12.3 13.2 12.0 13.5  HCT 39.5 28.1* 40.1 41.6 38.7 42.6  MCV 75.7* 77.6*  --   --  82.3 80.2  PLT 279 187  --   --  185 175   Cardiac Enzymes: No results for input(s): CKTOTAL, CKMB, CKMBINDEX, TROPONINI in the last 168 hours. BNP: Invalid input(s): POCBNP CBG: No results for input(s): GLUCAP in the last 168 hours. D-Dimer No results for input(s): DDIMER in the last 72 hours. Hgb A1c No results for input(s): HGBA1C in the last 72 hours. Lipid Profile No results for input(s): CHOL, HDL, LDLCALC, TRIG, CHOLHDL, LDLDIRECT in the last 72 hours. Thyroid function studies No results for input(s): TSH, T4TOTAL, T3FREE, THYROIDAB in the last 72 hours.  Invalid input(s): FREET3 Anemia work up No results for input(s): VITAMINB12, FOLATE, FERRITIN, TIBC, IRON, RETICCTPCT in the last 72 hours. Urinalysis    Component Value Date/Time   COLORURINE YELLOW (A) 07/14/2019 2110   APPEARANCEUR CLEAR (A) 07/14/2019 2110   LABSPEC >1.046 (H) 07/14/2019 2110   PHURINE 6.0 07/14/2019 2110   GLUCOSEU NEGATIVE 07/14/2019 2110   HGBUR NEGATIVE 07/14/2019 2110   [place] NEGATIVE 07/14/2019 2110   [place] NEGATIVE 07/14/2019 2110   PROTEINUR NEGATIVE 07/14/2019 2110   NITRITE NEGATIVE 07/14/2019 2110   LEUKOCYTESUR NEGATIVE 07/14/2019 2110   Sepsis Labs Invalid input(s): PROCALCITONIN,  WBC,  LACTICIDVEN Microbiology Recent Results (from the past 240 hour(s))  SARS CORONAVIRUS 2 (TAT 6-24 HRS) Nasopharyngeal Urine, Clean Catch     Status: None   Collection Time: 07/14/19  9:10 PM   Specimen: Urine, Clean Catch; Nasopharyngeal   Result Value Ref Range Status   SARS Coronavirus 2 NEGATIVE NEGATIVE Final    Comment: (NOTE) SARS-CoV-2 target nucleic acids are NOT DETECTED. The SARS-CoV-2 RNA is generally detectable in upper and lower respiratory specimens during the acute phase of infection. Negative results do not preclude SARS-CoV-2 infection, do not rule out co-infections with other pathogens, and should not be used as the sole basis for treatment or other patient management decisions. Negative results must be combined with clinical observations, patient history, and epidemiological information. The expected result is Negative. Fact Sheet for Patients: SugarRoll.be Fact  Sheet for Healthcare Providers: https://www.woods-mathews.com/ This test is not yet approved or cleared by the [place] FDA and  has been authorized for detection and/or diagnosis of SARS-CoV-2 by FDA under an Emergency Use Authorization (EUA). This EUA will remain  in effect (meaning this test can be used) for the duration of the COVID-19 declaration under Section 56 4(b)(1) of the Act, 21 U.S.C. section 360bbb-3(b)(1), unless the authorization is terminated or revoked sooner. Performed at [facility], [place] [address] [place], [place] [address]   C difficile quick scan w PCR reflex     Status: Abnormal   Collection Time: 07/15/19 11:40 AM   Specimen: STOOL  Result Value Ref Range Status   C Diff antigen POSITIVE (A) NEGATIVE Final   C Diff toxin POSITIVE (A) NEGATIVE Final   C Diff interpretation Toxin producing C. difficile detected.  Final    Comment: CRITICAL RESULT CALLED TO, READ BACK BY AND VERIFIED WITH: JOHNSON,E AT [id number] ON 07/15/2019 BY MOSLEY,J Performed at [facility], [address] [place], [place] [address]   Gastrointestinal Panel by PCR , Stool     Status: None   Collection Time: 07/15/19 11:40 AM   Specimen: STOOL  Result Value Ref Range Status    Campylobacter species NOT DETECTED NOT DETECTED Final   Plesimonas shigelloides NOT DETECTED NOT DETECTED Final   Salmonella species NOT DETECTED NOT DETECTED Final   Yersinia enterocolitica NOT DETECTED NOT DETECTED Final   Vibrio species NOT DETECTED NOT DETECTED Final   Vibrio cholerae NOT DETECTED NOT DETECTED Final   Enteroaggregative E coli (EAEC) NOT DETECTED NOT DETECTED Final   Enteropathogenic E coli (EPEC) NOT DETECTED NOT DETECTED Final   Enterotoxigenic E coli (ETEC) NOT DETECTED NOT DETECTED Final   Shiga like toxin producing E coli (STEC) NOT DETECTED NOT DETECTED Final   Shigella/Enteroinvasive E coli (EIEC) NOT DETECTED NOT DETECTED Final   Cryptosporidium NOT DETECTED NOT DETECTED Final   Cyclospora cayetanensis NOT DETECTED NOT DETECTED Final   Entamoeba histolytica NOT DETECTED NOT DETECTED Final   Giardia lamblia NOT DETECTED NOT DETECTED Final   Adenovirus F40/41 NOT DETECTED NOT DETECTED Final   Astrovirus NOT DETECTED NOT DETECTED Final   Norovirus GI/GII NOT DETECTED NOT DETECTED Final   Rotavirus A NOT DETECTED NOT DETECTED Final   Sapovirus (I, II, IV, and V) NOT DETECTED NOT DETECTED Final    Comment: Performed at [facility], [address] [place], [place] [address]  Body fluid culture     Status: None (Preliminary result)   Collection Time: 07/16/19 10:13 AM   Specimen: PATH Cytology Peritoneal fluid  Result Value Ref Range Status   Specimen Description   Final    PERITONEAL Performed at [facility], [address] [place], [place] [address]    Special Requests NONE  Final   Gram Stain NO WBC SEEN NO ORGANISMS SEEN   Final   Culture   Final    NO GROWTH 2 DAYS Performed at [facility], [address], [place], [place] [address]    Report Status PENDING  Incomplete     Time coordinating discharge: 40 minutes The  controlled substances registry was reviewed for this patient prior to filling the <5 days supply  controlled substances script.      SIGNED:   Edwin Dada, MD  [facility] 07/18/2019, 6:27 PM

## 2019-07-18 NOTE — Plan of Care (Signed)
The patient has been discharged. IV removed. Education completed. Medication education completed. Vitals have been stable.  Problem: Education: Goal: Knowledge of General Education information will improve Description: Including pain rating scale, medication(s)/side effects and non-pharmacologic comfort measures Outcome: Completed/Met   Problem: Health Behavior/Discharge Planning: Goal: Ability to manage health-related needs will improve Outcome: Completed/Met   Problem: Clinical Measurements: Goal: Ability to maintain clinical measurements within normal limits will improve Outcome: Completed/Met Goal: Will remain free from infection Outcome: Completed/Met Goal: Diagnostic test results will improve Outcome: Completed/Met Goal: Respiratory complications will improve Outcome: Completed/Met Goal: Cardiovascular complication will be avoided Outcome: Completed/Met   Problem: Coping: Goal: Level of anxiety will decrease Outcome: Completed/Met   Problem: Elimination: Goal: Will not experience complications related to bowel motility Outcome: Completed/Met Goal: Will not experience complications related to urinary retention Outcome: Completed/Met   Problem: Pain Managment: Goal: General experience of comfort will improve Outcome: Completed/Met   Problem: Skin Integrity: Goal: Risk for impaired skin integrity will decrease Outcome: Completed/Met   Problem: Safety: Goal: Ability to remain free from injury will improve Outcome: Completed/Met

## 2019-07-19 ENCOUNTER — Telehealth: Payer: Self-pay

## 2019-07-19 ENCOUNTER — Telehealth: Payer: Self-pay | Admitting: Nurse Practitioner

## 2019-07-19 NOTE — Telephone Encounter (Signed)
Called patient to reschedule the Initial Palliative Consult that was scheduled for 07/17/19 (due to pt was hospitalized), patient was in agreement with this.  Consult rescheduled for 08/14/19 @ 3:30 PM.

## 2019-07-19 NOTE — Telephone Encounter (Signed)
Transition Care Management Follow-up Telephone Call  Date of discharge and from where: 07/18/19 [facility]  How have you been since you were released from the hospital? Pt states her appetite has somewhat improved but neuropathy is much worse in lower legs. Pt feels like her feet are either on fire or extremely cold due to neuropathic pain.   Any questions or concerns? Yes  - pt wants to know if medication for neuropathy can be increased or changed. Pt has home health nurse coming in to take vitals and physical therapy to possibly help with neuropathy but she states it is unbearable.   Items Reviewed:  Did the pt receive and understand the discharge instructions provided? Yes   Medications obtained and verified? Yes   Any new allergies since your discharge? Yes   Dietary orders reviewed? Yes  Do you have support at home? Yes   Functional Questionnaire: (I = Independent and D = Dependent) ADLs: I  Bathing/Dressing- I  Meal Prep- I  Eating- I  Maintaining continence- I  Transferring/Ambulation- I  Managing Meds- I  Follow up appointments reviewed:   PCP Hospital f/u appt confirmed? Yes  Scheduled to see Raelyn Ensign NP on 07/27/19 @ 10:00 (virtual).  [facility] f/u appt confirmed? Yes  Scheduled to see Dr. Marius Ditch on 11/30 @ 1:00  Are transportation arrangements needed? No   If their condition worsens, is the pt aware to call PCP or go to the Emergency Dept.? Yes  Was the patient provided with contact information for the PCP's office or ED? Yes  Was to pt encouraged to call back with questions or concerns? Yes

## 2019-07-20 ENCOUNTER — Telehealth: Payer: Self-pay | Admitting: Family Medicine

## 2019-07-20 ENCOUNTER — Other Ambulatory Visit: Payer: Self-pay

## 2019-07-20 ENCOUNTER — Telehealth: Payer: Self-pay | Admitting: Gastroenterology

## 2019-07-20 ENCOUNTER — Ambulatory Visit: Payer: Medicare Other | Admitting: Family Medicine

## 2019-07-20 DIAGNOSIS — K7290 Hepatic failure, unspecified without coma: Secondary | ICD-10-CM

## 2019-07-20 DIAGNOSIS — K7460 Unspecified cirrhosis of liver: Secondary | ICD-10-CM

## 2019-07-20 LAB — BODY FLUID CULTURE
Culture: NO GROWTH
Gram Stain: NONE SEEN

## 2019-07-20 MED ORDER — ALBUMIN HUMAN 25 % IV SOLN: 25.0000 g | Freq: Once | INTRAVENOUS | Status: DC

## 2019-07-20 NOTE — Telephone Encounter (Signed)
Called left message for Anita Reed to call office on Monday

## 2019-07-20 NOTE — Telephone Encounter (Signed)
Sherlynn Stalls OT with [facility] is calling the patient decline OT evaluation

## 2019-07-20 NOTE — Telephone Encounter (Signed)
Pt left vm she states she needs another Paracentesis possibly 07/20/19 please call pt

## 2019-07-20 NOTE — Telephone Encounter (Signed)
Caller name: Mendel Ryder  Relation to pt: Nurse from [facility]  Call back number: [phone]    Reason for call:  Requesting resumption of care home health service

## 2019-07-20 NOTE — Telephone Encounter (Signed)
Emily notified.

## 2019-07-20 NOTE — Telephone Encounter (Signed)
Paracentesis has been ordered and faxed to special procedures, waiting for date, pt has been notified

## 2019-07-23 ENCOUNTER — Ambulatory Visit
Admission: RE | Admit: 2019-07-23 | Discharge: 2019-07-23 | Disposition: A | Discharge status: Home / Self Care | Payer: Medicare Other | Source: Ambulatory Visit | Attending: Gastroenterology | Admitting: Gastroenterology

## 2019-07-23 ENCOUNTER — Other Ambulatory Visit: Payer: Self-pay

## 2019-07-23 DIAGNOSIS — K7290 Hepatic failure, unspecified without coma: Secondary | ICD-10-CM | POA: Diagnosis not present

## 2019-07-23 DIAGNOSIS — K7460 Unspecified cirrhosis of liver: Secondary | ICD-10-CM | POA: Diagnosis not present

## 2019-07-23 DIAGNOSIS — R188 Other ascites: Secondary | ICD-10-CM | POA: Diagnosis not present

## 2019-07-23 IMAGING — US US PARACENTESIS
1 series · 5 of 5 positions shown · non-contrast
Comparison: Multiple previous ultrasound-guided paracenteses, most
recently [DATE] yielding 5.1 L of peritoneal fluid.

MEDICATIONS:
None.

COMPLICATIONS:
None immediate.

INDICATION: Decompensated hepatic cirrhosis. Please perform ultrasound-guided
paracentesis for therapeutic purposes.

EXAM:
ULTRASOUND-GUIDED PARACENTESIS
TECHNIQUE: Informed written consent was obtained from the patient after a
discussion of the risks, benefits and alternatives to treatment. A
timeout was performed prior to the initiation of the procedure.

[Series 1: us paracentesis · 0.26mm/px · 5 of 5 slices shown]
[im 1/5]
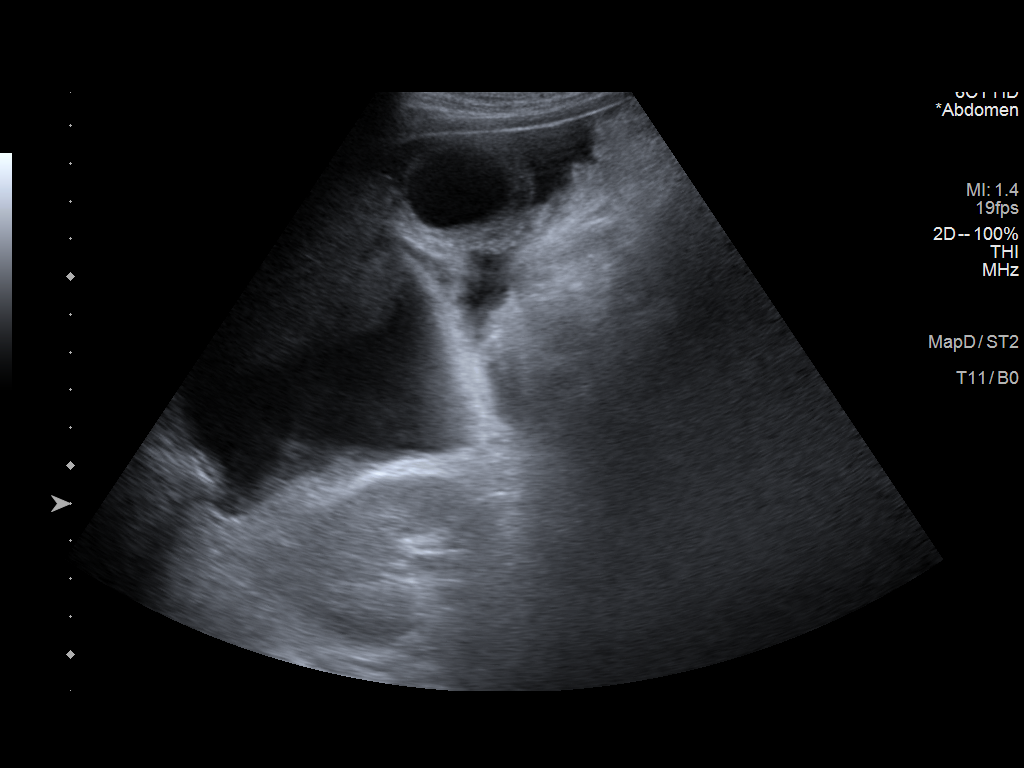
[im 2/5]
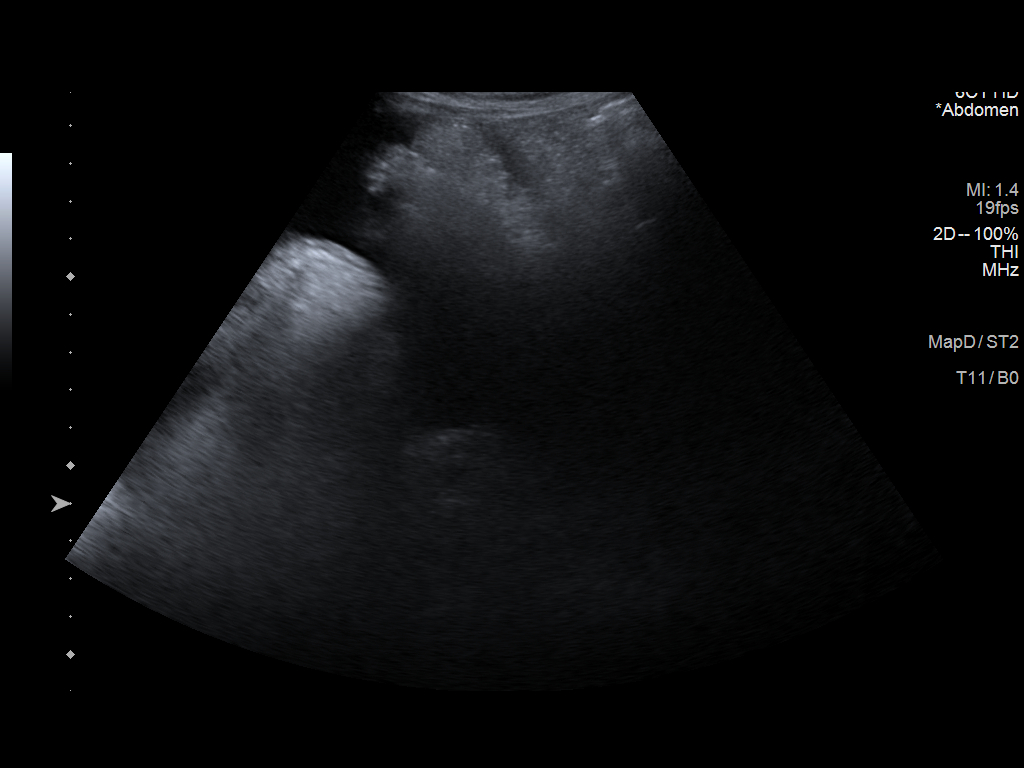
[im 3/5]
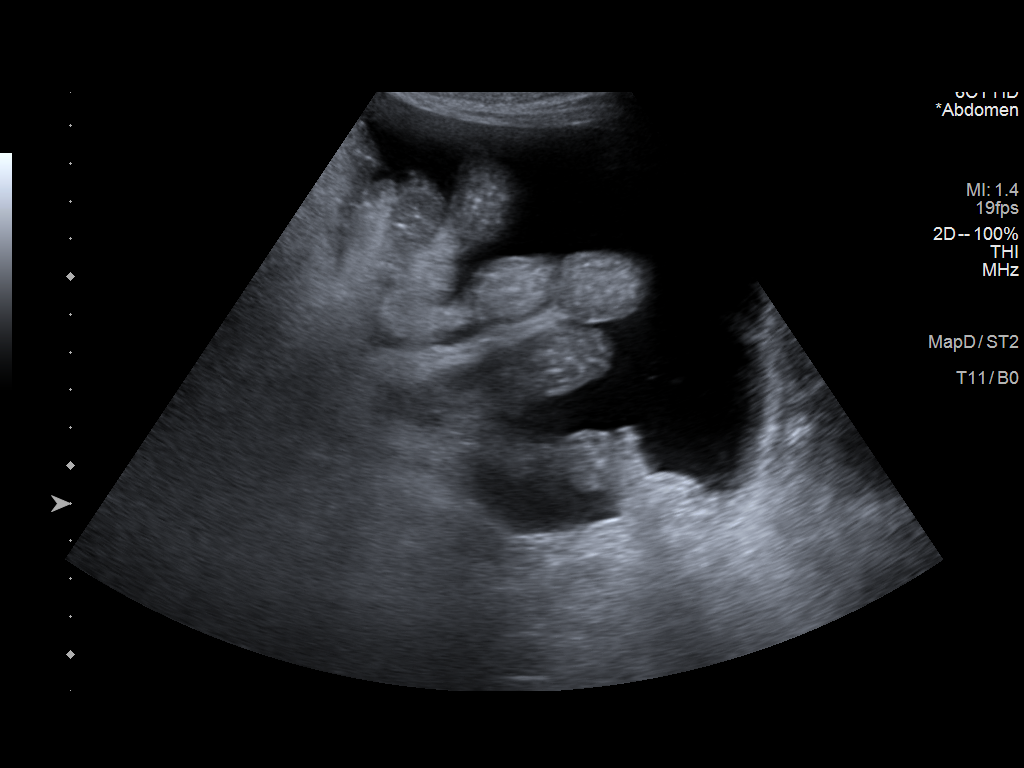
[im 4/5]
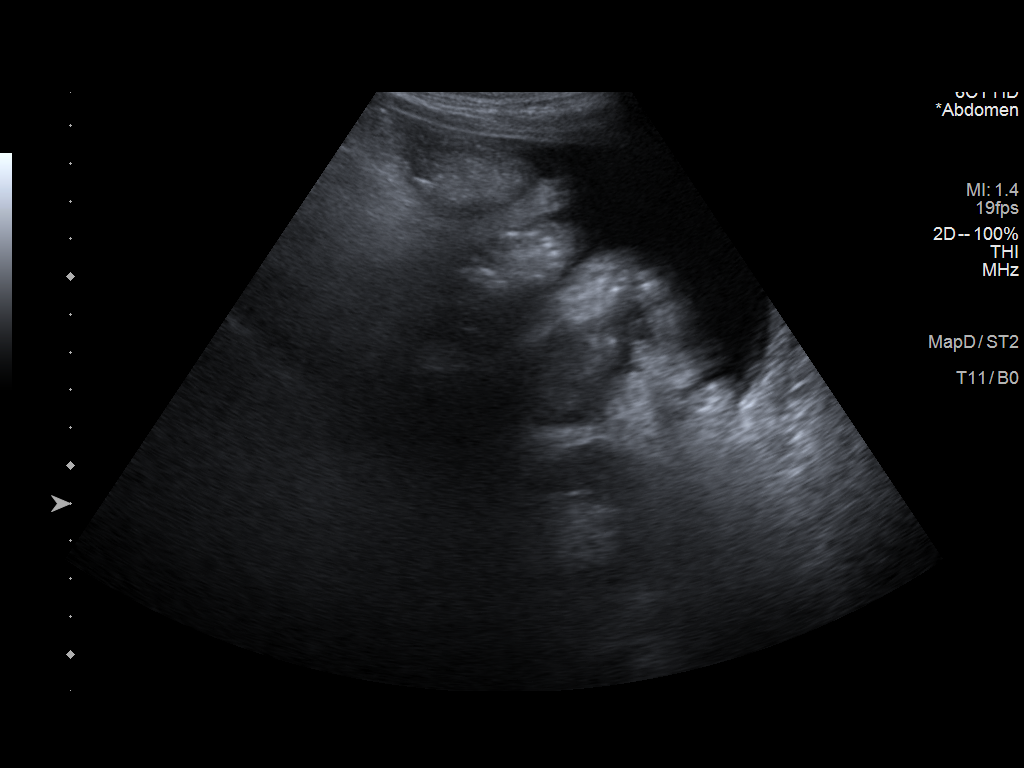
[im 5/5]
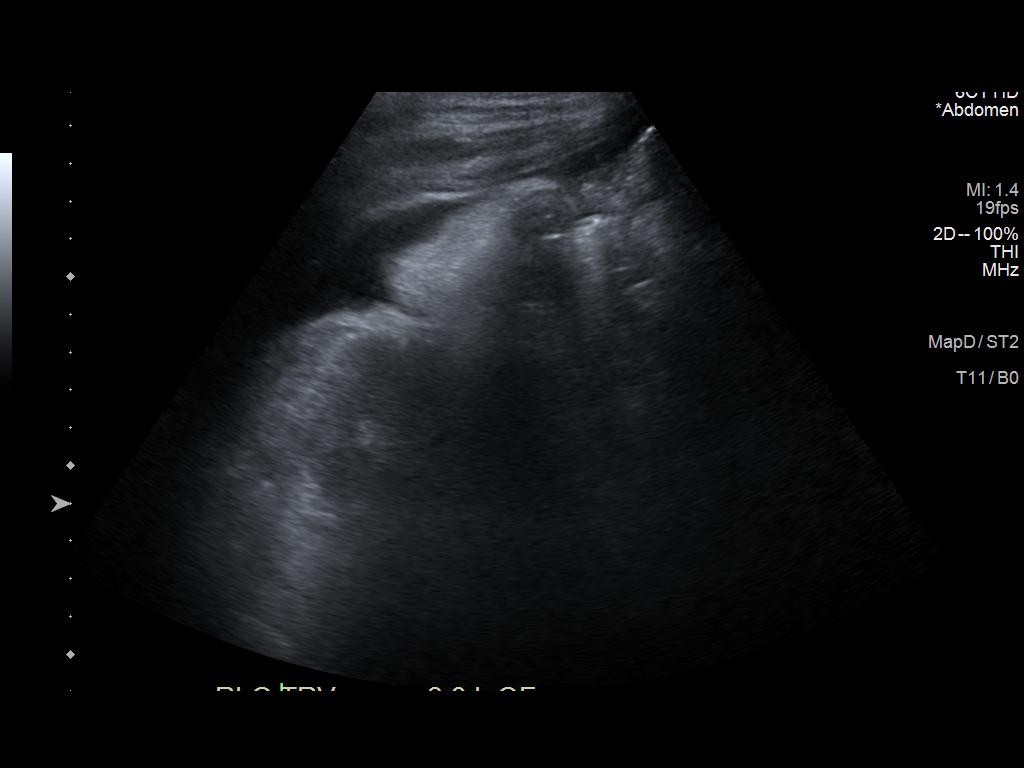

[5 of 5 positions shown; findings below may reference images not displayed]

Initial ultrasound scanning demonstrates a moderate amount of
ascites within the right lower abdominal quadrant. The right lower
abdomen was prepped and draped in the usual sterile fashion. 1%
lidocaine with epinephrine was used for local anesthesia. An
ultrasound image was saved for documentation purposed. An 8 Fr
Safe-T-Centesis catheter was introduced. The paracentesis was
performed. The catheter was removed and a dressing was applied. The
patient tolerated the procedure well without immediate post
procedural complication.
FINDINGS: A total of approximately 2.9 liters of serous fluid was removed.
IMPRESSION: Successful ultrasound-guided paracentesis yielding 2.9 liters of
peritoneal fluid.

## 2019-07-23 NOTE — Telephone Encounter (Signed)
Per Raquel Sarna NP  verbal orders given  to resume [place]

## 2019-07-23 NOTE — Procedures (Signed)
Pre Procedural Dx: Symptomatic Ascites Post Procedural Dx: Same  Successful US guided paracentesis yielding 2.9 L of serous ascitic fluid. Sample sent to laboratory as requested.  EBL: None Complications: None immediate  [name], MD Pager #: [phone]

## 2019-07-24 NOTE — Telephone Encounter (Signed)
Erline Levine calling from [facility] called and stated that she had a visit with her today.   She would like a verbal for PT  Frequency# 2x3 1x2  She also states that pt has a significant amount of fluid retention. She states that legs are very swollen.   She also states that she has a significant amount of pain. Erline Levine states that this my hinder results. Erline Levine states that the pt stated that the muscle relaxer's seem to help with pain better. Erline Levine would like to know if pain management would be appropriate.

## 2019-07-25 ENCOUNTER — Telehealth: Payer: Medicare Other | Admitting: Family Medicine

## 2019-07-27 ENCOUNTER — Ambulatory Visit (INDEPENDENT_AMBULATORY_CARE_PROVIDER_SITE_OTHER): Payer: Medicare Other | Admitting: Family Medicine

## 2019-07-27 ENCOUNTER — Other Ambulatory Visit: Payer: Self-pay

## 2019-07-27 ENCOUNTER — Encounter: Payer: Self-pay | Admitting: Family Medicine

## 2019-07-27 DIAGNOSIS — G8929 Other chronic pain: Secondary | ICD-10-CM | POA: Insufficient documentation

## 2019-07-27 DIAGNOSIS — M62838 Other muscle spasm: Secondary | ICD-10-CM | POA: Insufficient documentation

## 2019-07-27 DIAGNOSIS — K7031 Alcoholic cirrhosis of liver with ascites: Secondary | ICD-10-CM

## 2019-07-27 DIAGNOSIS — F341 Dysthymic disorder: Secondary | ICD-10-CM | POA: Insufficient documentation

## 2019-07-27 DIAGNOSIS — E876 Hypokalemia: Secondary | ICD-10-CM

## 2019-07-27 DIAGNOSIS — K922 Gastrointestinal hemorrhage, unspecified: Secondary | ICD-10-CM | POA: Diagnosis not present

## 2019-07-27 DIAGNOSIS — R252 Cramp and spasm: Secondary | ICD-10-CM

## 2019-07-27 DIAGNOSIS — I959 Hypotension, unspecified: Secondary | ICD-10-CM | POA: Diagnosis not present

## 2019-07-27 DIAGNOSIS — D649 Anemia, unspecified: Secondary | ICD-10-CM

## 2019-07-27 MED ORDER — GABAPENTIN 300 MG PO CAPS: ORAL_CAPSULE | ORAL | 3 refills | Status: DC

## 2019-07-27 MED ORDER — MIDODRINE HCL 5 MG PO TABS: 5.0000 mg | ORAL_TABLET | Freq: Three times a day (TID) | ORAL | 0 refills | Status: DC

## 2019-07-27 MED ORDER — FLUOXETINE HCL 10 MG PO CAPS: 10.0000 mg | ORAL_CAPSULE | Freq: Every day | ORAL | 3 refills | Status: DC

## 2019-07-27 MED ORDER — TIZANIDINE HCL 4 MG PO CAPS: 4.0000 mg | ORAL_CAPSULE | Freq: Two times a day (BID) | ORAL | 0 refills | Status: DC

## 2019-07-27 NOTE — Progress Notes (Signed)
Name: Anita Reed   MRN: [id number]    DOB: 04-09-1961   Date:07/27/2019       Progress Note  Subjective  Chief Complaint  Chief Complaint  Patient presents with  . Follow-up    neuropathy,   . Hospitalization Follow-up  . Referral    I connected with  [name] on 07/27/19 at 10:00 AM EST by telephone and verified that I am speaking with the correct person using two identifiers.  I discussed the limitations, risks, security and privacy concerns of performing an evaluation and management service by telephone and the availability of in person appointments. Staff also discussed with the patient that there may be a patient responsible charge related to this service. Patient Location: Home Provider Location: Office Additional Individuals present: None  HPI  Pt presents for hospital follow up - she has been admitted twice since her last visit with [place] - first a month ago on 06/22/2019 for sepsis and again on 07/14/2019 for acute lower GI bleed due to C.Diff. Since her most recent discharge she has been to see Dr. Marius Ditch for US guided paracentesis on 07/23/2019 and has initiated home health.  Acute lower GI Bleed - was discharged home on PO Vanc x14 days - still has 1 day left on this.  Her rectal bleeding has stopped since taking the vancomycin as the bleeding was attributed to C. Diff colitis.  She denies BRBPR, dark/tarry stools.  She notes improved appetite and less abdominal pain.  Acute blood loss anemia - received 2 units of blood as her Hgb dropped from 12.7 on admit to nadir of 9.1.  Will recheck labs to ensure stability.  Cirrhosis with ascitis - has paracentesis while admitted on 11/16, again outpatient 11/23 with Dr. Marius Ditch; the 11/16 procedure did r/o recurrent SBP.  She feels like she is doing a lot better since getting more regular paracentesis.  She is taking lasix 20mg  once daily, but still having BLE edema.   Hypotension - she is taking her midodrine, needs refill as  she is nearly out.  She has BP checked a few times a week with PT and states it has been in normal range.  No lightheadedness or dizziness/near-syncope.  Depression - She has been connected with palliative care while in the hospital, she is scheduled for follow up 08/14/2019.  She was started on Prozac 10mg  while admitted, has not noticed much of a difference - recommend giving the medication 6-8 weeks to reach full efficacy.    Office Visit from 07/27/2019 in [facility]  PHQ-9 Total Score  2      Chronic Pain -  She has been connected with palliative care while in the hospital, she is scheduled for follow up 08/14/2019.  She is taking tizanidine and this seems to help her muscle spasms in her legs; she was taking gabapentin but ran out.  She is doing PT regularly and they are helping with some stretching and strengthening.  We will plan to try increasing gabapentin and continue tizanidine until her palliative care appt, then may consider pain management referral if needed.  Hypokalemia and hypomagnesemia - noted on admission, resolved upon discharge; will recheck labs to ensure stability.  Patient Active Problem List   Diagnosis Date Noted  . C. difficile colitis   . Hypokalemia 07/15/2019  . Bandemia 07/15/2019  . Hypotension   . Goals of care, counseling/discussion   . Palliative care encounter   . Muscle cramping 06/22/2019  .  Alcohol abuse 06/22/2019  . Sepsis ([place]) 06/22/2019  . GIB (gastrointestinal bleeding) 06/01/2019  . GI bleeding 06/01/2019  . History of lymphoma 02/26/2019  . Atrial flutter ([place]) 02/07/2019  . Alcoholic cirrhosis of liver with ascites ([place]) 02/07/2019  . Persistent atrial fibrillation ([place]) 02/07/2019  . Anemia 02/07/2019  . Anorexia 02/07/2019  . GI bleed 01/28/2019  . Ascites 01/18/2019  . Cancer [facility])     Past Surgical History:  Procedure Laterality Date  . ABDOMINAL HYSTERECTOMY    . APPENDECTOMY    . COLONOSCOPY N/A  06/03/2019   Procedure: COLONOSCOPY;  Surgeon: Toledo, Benay Pike, MD;  Location: [facility] ENDOSCOPY;  Service: Gastroenterology;  Laterality: N/A;  . ESOPHAGOGASTRODUODENOSCOPY N/A 01/29/2019   Procedure: ESOPHAGOGASTRODUODENOSCOPY (EGD);  Surgeon: [name], MD;  Location: [facility] ENDOSCOPY;  Service: Gastroenterology;  Laterality: N/A;  . ESOPHAGOGASTRODUODENOSCOPY N/A 06/01/2019   Procedure: ESOPHAGOGASTRODUODENOSCOPY (EGD);  Surgeon: Toledo, Benay Pike, MD;  Location: [facility] ENDOSCOPY;  Service: Gastroenterology;  Laterality: N/A;  . TONSILLECTOMY      Family History  Problem Relation Age of Onset  . Cancer Mother   . Cancer Father     Social History   Socioeconomic History  . Marital status: Married    Spouse name: [name]  . Number of children: 1  . Years of education: 71  . Highest education level: 9th grade  Occupational History  . Occupation: disabled  Social Needs  . Financial resource strain: Somewhat hard  . Food insecurity    Worry: Sometimes true    Inability: Sometimes true  . Transportation needs    Medical: No    Non-medical: No  Tobacco Use  . Smoking status: Current Every Day Smoker  . Smokeless tobacco: Never Used  Substance and Sexual Activity  . Alcohol use: Not Currently  . Drug use: Never  . Sexual activity: Not Currently  Lifestyle  . Physical activity    Days per week: 0 days    Minutes per session: 0 min  . Stress: Only a little  Relationships  . Social Herbalist on phone: Once a week    Gets together: Never    Attends religious service: Never    Active member of club or organization: No    Attends meetings of clubs or organizations: Never    Relationship status: Married  . Intimate partner violence    Fear of current or ex partner: No    Emotionally abused: No    Physically abused: No    Forced sexual activity: No  Other Topics Concern  . Not on file  Social History Narrative  . Not on file     Current Outpatient  Medications:  .  digoxin (LANOXIN) 0.125 MG tablet, Take 1 tablet (0.125 mg total) by mouth daily., Disp: 30 tablet, Rfl: 0 .  FLUoxetine (PROZAC) 10 MG capsule, Take 1 capsule (10 mg total) by mouth daily., Disp: 30 capsule, Rfl: 3 .  furosemide (LASIX) 40 MG tablet, Take 0.5 tablets (20 mg total) by mouth daily., Disp: 30 tablet, Rfl: 0 .  tiZANidine (ZANAFLEX) 4 MG capsule, Take 1 capsule (4 mg total) by mouth 2 (two) times daily., Disp: 60 capsule, Rfl: 0 .  vancomycin (VANCOCIN) 125 MG capsule, Take 1 capsule (125 mg total) by mouth 4 (four) times daily., Disp: 40 capsule, Rfl: 0 .  midodrine (PROAMATINE) 5 MG tablet, Take 1 tablet (5 mg total) by mouth 3 (three) times daily with meals. (Patient not taking: Reported on  07/27/2019), Disp: 90 tablet, Rfl: 0  Current Facility-Administered Medications:  .  albumin human 25 % solution 25 g, 25 g, Intravenous, Once, Vanga, Tally Due, MD  No Known Allergies  I personally reviewed active problem list, medication list, allergies, notes from last encounter, lab results with the patient/caregiver today.   ROS  Ten systems reviewed and is negative except as mentioned in HPI  Objective  Virtual encounter, vitals not obtained.  There is no height or weight on file to calculate BMI.  Physical Exam   Pulmonary/Chest: Effort normal. No respiratory distress. Speaking in complete sentences Neurological: Pt is alert and oriented to person, place, and time. Speech is normal Psychiatric: Patient has a normal mood and affect. behavior is normal. Judgment and thought content normal.  No results found for this or any previous visit (from the past 72 hour(s)).  PHQ2/9: Depression screen [facility] 2/9 07/27/2019 06/22/2019 04/02/2019 03/07/2019 02/26/2019  Decreased Interest 1 0 0 0 0  Down, Depressed, Hopeless 1 0 0 0 0  PHQ - 2 Score 2 0 0 0 0  Altered sleeping 0 0 0 - 0  Tired, decreased energy 0 0 0 - 0  Change in appetite 0 0 0 - 0  Feeling bad or  failure about yourself  0 0 0 - 0  Trouble concentrating 0 0 0 - 0  Moving slowly or fidgety/restless 0 0 0 - 0  Suicidal thoughts - 0 0 - 0  PHQ-9 Score 2 0 0 - 0  Difficult doing work/chores Not difficult at all Not difficult at all Not difficult at all - Not difficult at all   PHQ-2/9 Result is positive.    Fall Risk: Fall Risk  07/27/2019 06/22/2019 04/02/2019 02/26/2019  Falls in the past year? 0 0 0 0  Number falls in past yr: 0 0 0 0  Injury with Fall? 0 0 0 0  Follow up Falls evaluation completed Falls evaluation completed Falls evaluation completed -    Assessment & Plan 1. Gastrointestinal hemorrhage, unspecified gastrointestinal hemorrhage type - Following with Dr. Marius Ditch, finishing vancomycin and no recurrent bleeding - CBC with Differential/Platelet  2. Anemia, unspecified type - CBC with Differential/Platelet  3. Alcoholic cirrhosis of liver with ascites (HCC) - Following closely with Dr. Marius Ditch  4. Muscle cramping - gabapentin (NEURONTIN) 300 MG capsule; Take 1 tablet once daily at night for 5 days, then increase to 1 tablet twice daily.  Dispense: 90 capsule; Refill: 3 - Magnesium - COMPLETE METABOLIC PANEL WITH GFR  5. Hypokalemia - Magnesium - COMPLETE METABOLIC PANEL WITH GFR  6. Muscle spasticity - gabapentin (NEURONTIN) 300 MG capsule; Take 1 tablet once daily at night for 5 days, then increase to 1 tablet twice daily.  Dispense: 90 capsule; Refill: 3 - tiZANidine (ZANAFLEX) 4 MG capsule; Take 1 capsule (4 mg total) by mouth 2 (two) times daily.  Dispense: 180 capsule; Refill: 0  7. Hypotension, unspecified hypotension type - CBC with Differential/Platelet - COMPLETE METABOLIC PANEL WITH GFR - midodrine (PROAMATINE) 5 MG tablet; Take 1 tablet (5 mg total) by mouth 3 (three) times daily with meals.  Dispense: 90 tablet; Refill: 0  8. Other chronic pain - Will refer to pain management after palliative care consult if pain still uncontrolled from higher  dose of gabapentin and if palliative not managing her pain at that time. - gabapentin (NEURONTIN) 300 MG capsule; Take 1 tablet once daily at night for 5 days, then increase to 1 tablet twice daily.  Dispense: 90 capsule; Refill: 3 - tiZANidine (ZANAFLEX) 4 MG capsule; Take 1 capsule (4 mg total) by mouth 2 (two) times daily.  Dispense: 180 capsule; Refill: 0  9. Dysthymia - Continue Prozac - recommend trying to take medication daily for 6-8 weeks to see full efficacy before making changes.   I discussed the assessment and treatment plan with the patient. The patient was provided an opportunity to ask questions and all were answered. The patient agreed with the plan and demonstrated an understanding of the instructions.   The patient was advised to call back or seek an in-person evaluation if the symptoms worsen or if the condition fails to improve as anticipated.  I provided 24 minutes of non-face-to-face time during this encounter.  Hubbard Hartshorn, FNP

## 2019-07-30 ENCOUNTER — Encounter: Payer: Self-pay | Admitting: Gastroenterology

## 2019-07-30 ENCOUNTER — Telehealth: Payer: Self-pay

## 2019-07-30 ENCOUNTER — Other Ambulatory Visit: Payer: Self-pay

## 2019-07-30 ENCOUNTER — Other Ambulatory Visit: Payer: Self-pay | Admitting: Gastroenterology

## 2019-07-30 ENCOUNTER — Telehealth: Payer: Self-pay | Admitting: Family Medicine

## 2019-07-30 ENCOUNTER — Ambulatory Visit (INDEPENDENT_AMBULATORY_CARE_PROVIDER_SITE_OTHER): Payer: Medicare Other | Admitting: Gastroenterology

## 2019-07-30 VITALS — BP 95/58 | HR 80 | Temp 98.2°F | Wt 137.4 lb

## 2019-07-30 DIAGNOSIS — D649 Anemia, unspecified: Secondary | ICD-10-CM | POA: Diagnosis not present

## 2019-07-30 DIAGNOSIS — E876 Hypokalemia: Secondary | ICD-10-CM | POA: Diagnosis not present

## 2019-07-30 DIAGNOSIS — I959 Hypotension, unspecified: Secondary | ICD-10-CM | POA: Diagnosis not present

## 2019-07-30 DIAGNOSIS — K7460 Unspecified cirrhosis of liver: Secondary | ICD-10-CM

## 2019-07-30 DIAGNOSIS — K7031 Alcoholic cirrhosis of liver with ascites: Secondary | ICD-10-CM

## 2019-07-30 DIAGNOSIS — K7290 Hepatic failure, unspecified without coma: Secondary | ICD-10-CM

## 2019-07-30 DIAGNOSIS — K922 Gastrointestinal hemorrhage, unspecified: Secondary | ICD-10-CM | POA: Diagnosis not present

## 2019-07-30 DIAGNOSIS — R252 Cramp and spasm: Secondary | ICD-10-CM | POA: Diagnosis not present

## 2019-07-30 MED ORDER — ALBUMIN HUMAN 25 % IV SOLN: INTRAVENOUS | 0 refills | Status: DC

## 2019-07-30 MED ORDER — DIGOXIN 125 MCG PO TABS: 0.1250 mg | ORAL_TABLET | Freq: Every day | ORAL | 0 refills | Status: DC

## 2019-07-30 NOTE — Addendum Note (Signed)
Addended by: Ulyess Blossom L on: 07/30/2019 02:21 PM   Modules accepted: Orders

## 2019-07-30 NOTE — Telephone Encounter (Addendum)
Informed patient that patient Paracentesis is scheduled for 08/02/2019 and arrive at the medical mall at 2pm. Patient verbalized understanding

## 2019-07-30 NOTE — Progress Notes (Signed)
Cephas Darby, MD [address]  [place], [place] [address]  Main: [phone]  Fax: [phone]    Gastroenterology Consultation  Referring Provider:     Hubbard Hartshorn, FNP Primary Care Physician:  Hubbard Hartshorn, FNP Primary Gastroenterologist:  Dr. Cephas Darby Reason for Consultation:     Decompensated alcoholic cirrhosis        HPI:   Anita Reed is a 58 y.o. female is here for hospital follow-up and management of decompensated alcoholic cirrhosis.  Patient is recently diagnosed with cirrhosis of liver in 12/2018 when she presented to [facility] with abdominal distention secondary to ascites which is consistent with portal hypertension based on fluid analysis, SSAG > 1.1.  She was discharged home on diuretics which she was not taking.  She then readmitted to [facility] secondary to active upper GI bleed about 2 weeks ago, EGD revealed Dieulafoy's lesion that was clipped.  She also had ascites at that time and underwent therapeutic paracentesis.  She was discharged home on furosemide 40 mg twice daily and spironolactone 150 mg daily, nadolol.  Her hemoglobin on discharge was 8.6.  Patient had chronic hep C which was treated in the past.  Viral load undetectable  Interval summary Patient reports that she did not catch even a drop of alcohol since last hospital admission.  She said in front of her husband that they removed all alcohol from her house.  She has been taking furosemide 40 mg twice daily but not spironolactone.  She is on Protonix 40 mg twice daily, also taking nadolol 20 mg daily.  She denies swelling in her legs but feels very gassy, abdomen distended.  She reports having irregular bowel habits chronically.  She reports her stools are orange-colored but not black or red.  She is trying to eat more protein.  Patient was seen on a tele-visit by Dr. Rockey Situ 2 days ago for a flutter which was thought to be secondary to hypokalemia.  Anticoagulation was deferred due to  recent GI bleed  Follow-up visit 03/26/2019 Patient has fever of 100.4, she does report diffuse abdominal pain, she thinks she is gassy and constipated from taking prenatal vitamins.  She is taking MiraLAX as needed and cut back on prenatal vitamins to every 3 days.  She denies subjective fevers but she had temp of 100.4 in our office today.  She said she was in the grocery store about a week ago, wearing mask.  She was tested negative for COVID twice in last 3 months.  She denies dark urine, yellowing of skin, black stools, hematemesis.  She reports not drinking alcohol but continues to smoke cigarettes  Follow-up visit 04/23/2019 Patient reports having a glass of wine for July 4 weekend.  Otherwise, she says she does not drink alcohol.  She says she feels good.  She is only concerned about severe constipation and abdominal distention with bloating, a lot of gas.  She is taking laxative as needed to have a bowel movement which is every 4 days associated with significant straining.  She denies swelling of legs, black stools, rectal bleeding, nausea, hematemesis, weight loss.  She continues to smoke.  She is taking diuretics as needed only.  She is taking Protonix, nadolol  Follow-up visit 07/30/2019 Since last office visit, patient was hospitalized twice, initially due to hematemesis.  Underwent EGD which revealed large esophageal varices with no stigmata of bleeding.  She also underwent colonoscopy, found to have pseudomembranous colitis.  She  was also diagnosed with SBP.  She was simultaneously treated for C. difficile colitis with vancomycin as well as SBP.  She was discharged home on Bactrim for SBP prophylaxis, resulted in recurrence of C. difficile and admission on 07/15/2019.  She was started on oral vancomycin.  She also underwent therapeutic paracentesis, that resulted in wound dehiscence, required placement of stent by the general surgery.  That has healed well.   She also developed A. fib with RVR,  started on digoxin per cardiology recommendations.  She cannot tolerate diuretics well due to her low blood pressure.  She is currently on 25 mg 3 times daily.  Since discharge, patient had undergone therapeutic paracentesis twice weekly last 2 weeks.  She states she has not been receiving IV albumin post paracentesis.  She is taking Lasix only 20 mg daily.  She ran out of digoxin.  Her heart rate is within normal limits today.  Blood pressure is low normal.  She denies abdominal pain, nausea, vomiting.  Reports good.  She denies black stools, her stools are well formed, she finished course of oral vancomycin pregnancy.  She said she has to call Dr. Donivan Scull office to make follow-up.  She continues to smoke.  She acknowledges not drinking alcohol.  NSAIDs: None  Antiplts/Anticoagulants/Anti thrombotics: None  GI Procedures: EGD 01/29/2019 - Normal duodenal bulb and second portion of the duodenum. - Severe portal hypertensive gastropathy in gastric fundus. - Large (> 5 mm) varices were found in the lower third of the esophagus, with no red wale signs or stigmata of recent bleed. - 2 cm hiatal hernia. - Dieulafoy lesion of stomach. - No specimens collected.  EGD 06/01/2019  - Grade II and large (> 5 mm) esophageal varices. - Portal hypertensive gastropathy. - Normal examined duodenum. - The examination was otherwise normal. - No specimens collected.  colonoscopy 06/03/2019  - The examined portion of the ileum was normal. - Pseudomembranous enterocolitis. - Blood in the entire examined colon. - Erythematous mucosa in the rectum and in the distal sigmoid colon. Biopsied. - Non-bleeding internal hemorrhoids. - The examination was otherwise normal. - Biopsies performed in the cecum and in the ascending colon. DIAGNOSIS:  A. COLON, RIGHT; COLD BIOPSY:  - ACTIVE COLITIS WITH SMALL FRAGMENTS OF INFLAMMATORY EXUDATE.  - SEE COMMENT.   B. COLON, DISTAL SIGMOID; COLD BIOPSY:  - PATCHY MILD TO  MODERATE ACTIVE COLITIS.  - SEE COMMENT.  Patient reports having had several colonoscopies in the past  Past Medical History:  Diagnosis Date   Anemia    Cancer ([place])    Cirrhosis ([place])    Hypertension     Past Surgical History:  Procedure Laterality Date   ABDOMINAL HYSTERECTOMY     APPENDECTOMY     COLONOSCOPY N/A 06/03/2019   Procedure: COLONOSCOPY;  Surgeon: Toledo, Benay Pike, MD;  Location: [facility] ENDOSCOPY;  Service: Gastroenterology;  Laterality: N/A;   ESOPHAGOGASTRODUODENOSCOPY N/A 01/29/2019   Procedure: ESOPHAGOGASTRODUODENOSCOPY (EGD);  Surgeon: [name], MD;  Location: [facility] ENDOSCOPY;  Service: Gastroenterology;  Laterality: N/A;   ESOPHAGOGASTRODUODENOSCOPY N/A 06/01/2019   Procedure: ESOPHAGOGASTRODUODENOSCOPY (EGD);  Surgeon: Toledo, Benay Pike, MD;  Location: [facility] ENDOSCOPY;  Service: Gastroenterology;  Laterality: N/A;   TONSILLECTOMY      Current Outpatient Medications:    FLUoxetine (PROZAC) 10 MG capsule, Take 1 capsule (10 mg total) by mouth daily., Disp: 30 capsule, Rfl: 3   furosemide (LASIX) 40 MG tablet, Take 0.5 tablets (20 mg total) by mouth daily., Disp: 30 tablet,  Rfl: 0   gabapentin (NEURONTIN) 300 MG capsule, Take 1 tablet once daily at night for 5 days, then increase to 1 tablet twice daily., Disp: 90 capsule, Rfl: 3   midodrine (PROAMATINE) 5 MG tablet, Take 1 tablet (5 mg total) by mouth 3 (three) times daily with meals., Disp: 90 tablet, Rfl: 0   tiZANidine (ZANAFLEX) 4 MG capsule, Take 1 capsule (4 mg total) by mouth 2 (two) times daily., Disp: 180 capsule, Rfl: 0   albumin human 25 % bottle, Inject 25 grams per every 4 Liters removed Max 50 grams, Disp: 50 mL, Rfl: 0   digoxin (LANOXIN) 0.125 MG tablet, Take 1 tablet (0.125 mg total) by mouth daily., Disp: 30 tablet, Rfl: 0  Current Facility-Administered Medications:    albumin human 25 % solution 25 g, 25 g, Intravenous, Once, Khaza Blansett, Tally Due, MD  Family History    Problem Relation Age of Onset   Cancer Mother    Cancer Father      Social History   Tobacco Use   Smoking status: Current Every Day Smoker   Smokeless tobacco: Never Used  Substance Use Topics   Alcohol use: Not Currently   Drug use: Never    Allergies as of 07/30/2019   (No Known Allergies)    Review of Systems:    All systems reviewed and negative except where noted in HPI.   Physical Exam:  BP (!) 95/58 (BP Location: Left Arm, Patient Position: Sitting, Cuff Size: Normal)    Pulse 80    Temp 98.2 F (36.8 C) (Oral)    Wt 137 lb 6 oz (62.3 kg)    BMI 20.89 kg/m  No LMP recorded. Patient has had a hysterectomy.  General:   Alert, thin built, cachectic appearing, poorly nourished, pleasant and cooperative in NAD Head:  Normocephalic and atraumatic, bitemporal wasting. Eyes: Anicteric.   Muddy conjunctiva Ears:  Normal auditory acuity. Nose:  No deformity, discharge, or lesions. Mouth:  No deformity or lesions,oropharynx pink & moist. Neck:  Supple; no masses or thyromegaly. Lungs:  Respirations even and unlabored.  Clear throughout to auscultation.   No wheezes, crackles, or rhonchi. No acute distress. Heart:  Regular rate and rhythm; no murmurs, clicks, rubs, or gallops. Abdomen:  Normal bowel sounds. Soft, non-tender and diffusely distended, nontender, without masses, hepatosplenomegaly or hernias noted.  No guarding or rebound tenderness.   Rectal: Not performed Msk:  Symmetrical without gross deformities. Good, equal movement & strength bilaterally. Pulses:  Normal pulses noted. Extremities:  No clubbing or edema.  No cyanosis. Neurologic:  Alert and oriented x3;  grossly normal neurologically. Skin:  Intact without significant lesions or rashes. No jaundice. Psych:  Alert and cooperative. Normal mood and affect.  Imaging Studies: Reviewed  Assessment and Plan:   Anita Reed is a 58 y.o. female with history of chronic hep C, status post treatment,  SVR, decompensated alcoholic cirrhosis with refractory ascites, known esophageal varices, upper GI bleed secondary to Dieulafoy's is here for follow-up  Decompensated cirrhosis of liver, secondary to alcohol use Hepatitis A and B serologies negative Chronic hep C, cured, status post treatment Portal hypertension manifested as esophageal varices, ascites Ascites: Diuretic refractory ascites, unable to tolerate diuretics due to borderline hypotension, needing frequent therapeutic paracentesis.  Will refer her to interventional radiology for evaluation of TIPS.  Therapeutic paracentesis as needed along with administration of 50 g IV albumin post paracentesis each time.  Continue furosemide 20 mg daily, continue 2 g sodium diet.  History of SBP.  Will hold off Bactrim prophylaxis due to recurrent C. difficile infection.   Esophageal varices: Nonbleeding, unable to do nonselective beta-blockers due to low blood pressure Anemia: Had mild iron deficiency anemia, currently resolved Continue prenatal multivitamin HCC screening: serum AFP levels normal, CT abdomen and pelvis in 12/2018 did not detect any liver lesions HRS: None Hepatic encephalopathy: Not present Reiterated to her about complete abstinence from alcohol Administered first and second doses of Twinrix, Prevnar on 02/09/2019, Pneumovax in 05/2019  History of A. fib with RVR Continue digoxin Follow-up with Dr.Gollan ASAP  History of C. Difficile First episode in 06/08/2019, first recurrence on 07/15/2019, both treated with oral vancomycin.  Currently, diarrhea resolved   Follow up in 1 month   Cephas Darby, MD

## 2019-07-30 NOTE — Telephone Encounter (Signed)
Received message from Dr. Marius Ditch requesting patient to be seen for TIPS clearance by Cardiology.  Appointment is made with Christell Faith PA-C for tomorrow.

## 2019-07-31 ENCOUNTER — Ambulatory Visit (INDEPENDENT_AMBULATORY_CARE_PROVIDER_SITE_OTHER): Payer: Medicare Other | Admitting: Physician Assistant

## 2019-07-31 ENCOUNTER — Encounter: Payer: Self-pay | Admitting: Physician Assistant

## 2019-07-31 VITALS — BP 110/70 | HR 77 | Temp 97.7°F | Ht 69.0 in | Wt 140.5 lb

## 2019-07-31 DIAGNOSIS — Z01810 Encounter for preprocedural cardiovascular examination: Secondary | ICD-10-CM

## 2019-07-31 DIAGNOSIS — I8501 Esophageal varices with bleeding: Secondary | ICD-10-CM

## 2019-07-31 DIAGNOSIS — D62 Acute posthemorrhagic anemia: Secondary | ICD-10-CM

## 2019-07-31 DIAGNOSIS — I4819 Other persistent atrial fibrillation: Secondary | ICD-10-CM | POA: Diagnosis not present

## 2019-07-31 DIAGNOSIS — K922 Gastrointestinal hemorrhage, unspecified: Secondary | ICD-10-CM

## 2019-07-31 DIAGNOSIS — K766 Portal hypertension: Secondary | ICD-10-CM

## 2019-07-31 DIAGNOSIS — K7031 Alcoholic cirrhosis of liver with ascites: Secondary | ICD-10-CM

## 2019-07-31 DIAGNOSIS — I483 Typical atrial flutter: Secondary | ICD-10-CM

## 2019-07-31 LAB — COMPLETE METABOLIC PANEL WITH GFR
AG Ratio: 1.7 (calc) (ref 1.0–2.5)
ALT: 30 U/L — ABNORMAL HIGH (ref 6–29)
AST: 69 U/L — ABNORMAL HIGH (ref 10–35)
Albumin: 3.3 g/dL — ABNORMAL LOW (ref 3.6–5.1)
Alkaline phosphatase (APISO): 447 U/L — ABNORMAL HIGH (ref 37–153)
BUN/Creatinine Ratio: 27 (calc) — ABNORMAL HIGH (ref 6–22)
BUN: 9 mg/dL (ref 7–25)
CO2: 20 mmol/L (ref 20–32)
Calcium: 8.6 mg/dL (ref 8.6–10.4)
Chloride: 103 mmol/L (ref 98–110)
Creat: 0.33 mg/dL — ABNORMAL LOW (ref 0.50–1.05)
GFR, Est African American: 143 mL/min/{1.73_m2} (ref >=60)
GFR, Est Non African American: 123 mL/min/{1.73_m2} (ref >=60)
Globulin: 2 g/dL (calc) (ref 1.9–3.7)
Glucose, Bld: 132 mg/dL — ABNORMAL HIGH (ref 65–99)
Potassium: 3.5 mmol/L (ref 3.5–5.3)
Sodium: 137 mmol/L (ref 135–146)
Total Bilirubin: 1.8 mg/dL — ABNORMAL HIGH (ref 0.2–1.2)
Total Protein: 5.3 g/dL — ABNORMAL LOW (ref 6.1–8.1)

## 2019-07-31 LAB — CBC WITH DIFFERENTIAL/PLATELET
Absolute Monocytes: 1300 cells/uL — ABNORMAL HIGH (ref 200–950)
Basophils Absolute: 79 cells/uL (ref 0–200)
Basophils Relative: 0.7 %
Eosinophils Absolute: 170 cells/uL (ref 15–500)
Eosinophils Relative: 1.5 %
HCT: 38.1 % (ref 35.0–45.0)
Hemoglobin: 12.6 g/dL (ref 11.7–15.5)
Lymphs Abs: 1785 cells/uL (ref 850–3900)
MCH: 26.5 pg — ABNORMAL LOW (ref 27.0–33.0)
MCHC: 33.1 g/dL (ref 32.0–36.0)
MCV: 80.2 fL (ref 80.0–100.0)
MPV: 11.2 fL (ref 7.5–12.5)
Monocytes Relative: 11.5 %
Neutro Abs: 7967 cells/uL — ABNORMAL HIGH (ref 1500–7800)
Neutrophils Relative %: 70.5 %
Platelets: 225 10*3/uL (ref 140–400)
RBC: 4.75 10*6/uL (ref 3.80–5.10)
RDW: 18.9 % — ABNORMAL HIGH (ref 11.0–15.0)
Total Lymphocyte: 15.8 %
WBC: 11.3 10*3/uL — ABNORMAL HIGH (ref 3.8–10.8)

## 2019-07-31 LAB — MAGNESIUM: Magnesium: 1.7 mg/dL (ref 1.5–2.5)

## 2019-07-31 NOTE — Progress Notes (Signed)
Cardiology Office Note    Date:  07/31/2019   ID:  Anita Reed, DOB 11-Mar-1961, MRN [id number]  PCP:  Hubbard Hartshorn, FNP  Cardiologist:  Ida Rogue, MD  Electrophysiologist:  None   Chief Complaint: Preprocedure cardiac evaluation  History of Present Illness:   Anita Reed is a 58 y.o. female with history of A. Fib/flutter not on oral anticoagulation secondary to GI bleed, severe portal hypertension, and esophageal varices, decompensated alcoholic cirrhosis with ascites requiring intermittent paracentesis, hepatitis C, GI bleed, anemia, spontaneous bacterial peritonitis, hepatic encephalopathy, HTN, C. difficile colitis, and medication noncompliance who presents for preprocedure cardiac evaluation.  Patient was admitted to the hospital in 12/2018 with weakness and shortness of breath with associated melena.  Abdomen was distended consistent with ascites.  She was noted to have a hemoglobin of 4 requiring packed red blood cell transfusion.  She was noted to be in atrial fibrillation/flutter.  She underwent EGD which showed severe portal hypertensive gastropathy with active GI bleed status post clipping.  Echo during that admission showed an EF of 55 to 60%, no regional wall motion normalities PASP 35 mmHg, mild left atrial enlargement, mild to moderate TR.  Rhythm is atrial flutter.  She declined further work-up in the hospital with notes indicating she may have left AMA.  She was seen virtually by Dr. Rockey Situ on 02/07/2019 and continued to drink some alcohol.  She was not felt to be a good candidate for anticoagulation given GI bleeding and esophageal varices.  More recently, she was admitted to the hospital in 05/2019 with GI bleed with elective colonoscopy revealing pseudomembranous enterocolitis with blood throughout the colon and nonbleeding internal hemorrhoids.  She was readmitted to the hospital in late 05/2019 seeing her PCP with significant abdominal distention and pain and refusing  ER evaluation.  Labs were obtained in the outpatient setting with the patient found to be hypokalemic with a potassium of 2.6.  In this setting, she was advised to present to the ED where she was noted to be in atrial flutter with RVR with ventricular rates in the 130s bpm.  She underwent paracentesis.  Rate control strategy was continued as she was not a candidate for anticoagulation.  She was not felt to meet criteria for liver transplant.  She was readmitted to the hospital in mid 07/2019 with recurrent GI bleed felt to be in the setting of C. difficile infection managed conservatively with oral vancomycin with resolution of bleeding.  She required 2 units of packed red blood cell.  She underwent paracentesis on 07/16/2019 with SBP being ruled out.  She was documented to be in sinus rhythm during that admission.  Regarding her A. fib, primary cardiologist has managed patient on digoxin for rate control.  Historically, she has not tolerated diuretic therapy secondary to relative hypotension.  She was seen by GI on 07/30/2019 with BP 95/58 and heart rate 80 bpm.  She indicated she had ran out of digoxin.  She comes in today for preprocedure evaluation of TIPS.  She comes in doing reasonably well from a cardiac perspective.  She denies any chest pain, worsening dyspnea, dizziness, presyncope, syncope.  She does intermittently note some brief palpitations after she has exerted herself at home.  However, these do not feel similar to her prior episodes of documented A. fib/flutter.  She has stable shortness of breath which she has been attributing to her ongoing tobacco use and physical deconditioning.  She continues to require intermittent paracentesis and is scheduled  for this on 08/04/2019.  She denies any BRBPR or melena.  She continues to smoke approximately 1/2 pack daily.  She has not had any alcohol since 12/2018.  She was briefly out of digoxin for several days prior to restarting this on 07/30/2019.   Otherwise, she does not have any cardiac issues or concerns today.   Labs: 07/2019 - BUN 9, serum creatinine 0.33, sodium 137, potassium 3.5, albumin 3.3, AST 69, ALT 30, magnesium 1.7, Hgb 12.6, PLT 225, INR 1.2 06/28/2019 - digoxin 1.3 05/2019 - TSH normal, A1c 4.9  Past Medical History:  Diagnosis Date  . Anemia   . Atrial flutter ([place])   . Cancer ([place])   . Cirrhosis ([place])   . Esophageal varices ([place])   . Hypertension   . Portal hypertension ([facility])     Past Surgical History:  Procedure Laterality Date  . ABDOMINAL HYSTERECTOMY    . APPENDECTOMY    . COLONOSCOPY N/A 06/03/2019   Procedure: COLONOSCOPY;  Surgeon: Toledo, Benay Pike, MD;  Location: [facility] ENDOSCOPY;  Service: Gastroenterology;  Laterality: N/A;  . ESOPHAGOGASTRODUODENOSCOPY N/A 01/29/2019   Procedure: ESOPHAGOGASTRODUODENOSCOPY (EGD);  Surgeon: [name], MD;  Location: [facility] ENDOSCOPY;  Service: Gastroenterology;  Laterality: N/A;  . ESOPHAGOGASTRODUODENOSCOPY N/A 06/01/2019   Procedure: ESOPHAGOGASTRODUODENOSCOPY (EGD);  Surgeon: Toledo, Benay Pike, MD;  Location: [facility] ENDOSCOPY;  Service: Gastroenterology;  Laterality: N/A;  . TONSILLECTOMY      Current Medications: Current Meds  Medication Sig  . albumin human 25 % bottle Inject 50 grams no matter the amount removed.  . digoxin (LANOXIN) 0.125 MG tablet Take 1 tablet (0.125 mg total) by mouth daily.  [name] FLUoxetine (PROZAC) 10 MG capsule Take 1 capsule (10 mg total) by mouth daily.  . furosemide (LASIX) 40 MG tablet Take 0.5 tablets (20 mg total) by mouth daily.  [name] gabapentin (NEURONTIN) 300 MG capsule Take 1 tablet once daily at night for 5 days, then increase to 1 tablet twice daily.  . midodrine (PROAMATINE) 5 MG tablet Take 1 tablet (5 mg total) by mouth 3 (three) times daily with meals.  [name] tiZANidine (ZANAFLEX) 4 MG capsule Take 1 capsule (4 mg total) by mouth 2 (two) times daily.   Current Facility-Administered Medications for the 07/31/19 encounter  (Office Visit) with Rise Mu, PA-C  Medication  . albumin human 25 % solution 25 g    Allergies:   Patient has no known allergies.   Social History   Socioeconomic History  . Marital status: Married    Spouse name: [name]  . Number of children: 1  . Years of education: 79  . Highest education level: 9th grade  Occupational History  . Occupation: disabled  Social Needs  . Financial resource strain: Somewhat hard  . Food insecurity    Worry: Sometimes true    Inability: Sometimes true  . Transportation needs    Medical: No    Non-medical: No  Tobacco Use  . Smoking status: Current Every Day Smoker    Packs/day: 1.00    Types: Cigarettes  . Smokeless tobacco: Never Used  Substance and Sexual Activity  . Alcohol use: Not Currently  . Drug use: Never  . Sexual activity: Not Currently  Lifestyle  . Physical activity    Days per week: 0 days    Minutes per session: 0 min  . Stress: Only a little  Relationships  . Social connections    Talks on phone: Once a week    Gets together:  Never    Attends religious service: Never    Active member of club or organization: No    Attends meetings of clubs or organizations: Never    Relationship status: Married  Other Topics Concern  . Not on file  Social History Narrative  . Not on file     Family History:  The patient's family history includes Cancer in her father and mother.  ROS:   Review of Systems  Constitutional: Positive for malaise/fatigue. Negative for chills, diaphoresis, fever and weight loss.  HENT: Negative for congestion.   Eyes: Negative for discharge and redness.  Respiratory: Negative for cough, hemoptysis, sputum production, shortness of breath and wheezing.   Cardiovascular: Negative for chest pain, palpitations, orthopnea, claudication, leg swelling and PND.  Gastrointestinal: Negative for abdominal pain, blood in stool, constipation, diarrhea, heartburn, melena, nausea and vomiting.  Genitourinary:  Negative for hematuria.  Musculoskeletal: Negative for falls and myalgias.  Skin: Negative for rash.  Neurological: Positive for weakness. Negative for dizziness, tingling, tremors, sensory change, speech change, focal weakness and loss of consciousness.  Endo/Heme/Allergies: Does not bruise/bleed easily.  Psychiatric/Behavioral: Negative for substance abuse. The patient is not nervous/anxious.   All other systems reviewed and are negative.    EKGs/Labs/Other Studies Reviewed:    Studies reviewed were summarized above. The additional studies were reviewed today:  2D echo 01/19/2019: 1. The left ventricle has normal systolic function, with an ejection fraction of 55-60%. The cavity size was normal. Left ventricular diastolic function could not be evaluated. No evidence of left ventricular regional wall motion abnormalities.  2. The right ventricle has normal systolic function. The cavity was not assessed. There is no increase in right ventricular wall thickness. Right ventricular systolic pressure is mildly elevated with an estimated pressure of 35.0 mmHg.  3. Left atrial size was mildly dilated.  4. The mitral valve is degenerative. Mild thickening of the mitral valve leaflet. There is moderate mitral annular calcification present. No evidence of mitral valve stenosis.  5. The tricuspid valve is grossly normal. Tricuspid valve regurgitation is mild-moderate.  6. The aortic valve is tricuspid. Moderate thickening of the aortic valve. Mild calcification of the aortic valve.  7. The aortic root is normal in size and structure.  8. The inferior vena cava was dilated in size with <50% respiratory variability.  9. Underlying rhythm appears to be atrial flutter.  EKG:  EKG is ordered today.  The EKG ordered today demonstrates NSR, 77 bpm, possible prior inferior infarct, nonspecific ST-T changes  Recent Labs: 01/18/2019: B Natriuretic Peptide 771.0 06/23/2019: TSH 1.446 07/30/2019: ALT 30; BUN  9; Creat 0.33; Hemoglobin 12.6; Magnesium 1.7; Platelets 225; Potassium 3.5; Sodium 137  Recent Lipid Panel No results found for: CHOL, TRIG, HDL, CHOLHDL, VLDL, LDLCALC, LDLDIRECT  PHYSICAL EXAM:    VS:  BP 110/70 (BP Location: Left Arm, Patient Position: Sitting, Cuff Size: Normal)   Pulse 77   Temp 97.7 F (36.5 C)   Ht 5\' 9"  (1.753 m)   Wt 140 lb 8 oz (63.7 kg)   SpO2 98%   BMI 20.75 kg/m   BMI: Body mass index is 20.75 kg/m.  Physical Exam  Constitutional: She is oriented to person, place, and time. She appears well-developed and well-nourished.  HENT:  Head: Normocephalic and atraumatic.  Eyes: Right eye exhibits no discharge. Left eye exhibits no discharge.  Neck: Normal range of motion. No JVD present.  Cardiovascular: Normal rate, regular rhythm, S1 normal, S2 normal and normal heart sounds.  Exam reveals no distant heart sounds, no friction rub, no midsystolic click and no opening snap.  No murmur heard. Pulses:      Posterior tibial pulses are 2+ on the right side and 2+ on the left side.  Pulmonary/Chest: Effort normal and breath sounds normal. No respiratory distress. She has no decreased breath sounds. She has no wheezes. She has no rales. She exhibits no tenderness.  Abdominal: Soft. She exhibits distension. There is no abdominal tenderness.  Musculoskeletal:        General: No edema.  Neurological: She is alert and oriented to person, place, and time.  Skin: Skin is warm and dry. No cyanosis. Nails show no clubbing.  Psychiatric: She has a normal mood and affect. Her speech is normal and behavior is normal. Judgment and thought content normal.    Wt Readings from Last 3 Encounters:  07/31/19 140 lb 8 oz (63.7 kg)  07/30/19 137 lb 6 oz (62.3 kg)  07/16/19 130 lb 15.3 oz (59.4 kg)     ASSESSMENT & PLAN:   1. Preprocedure cardiac evaluation: Patient is planning to undergo TIPS procedure.  Per Revised Cardiac Index, she is low risk for noncardiac procedure with  a 0.9% estimated rate of adverse event.  She is able to achieve greater than 4 METs without cardiac limitation.  She is able to proceed for noncardiac procedure without any further cardiac work-up at low cardiac risk.  2. Atrial fibrillation/flutter: Patient is maintaining sinus rhythm since at least mid 07/2019.  She has not been maintained on oral anticoagulation in the setting of her significant comorbid conditions.  She is aware of stroke risk.  Continue digoxin as outlined below as she is not a candidate for beta-blocker or nondihydropyridine calcium channel blocker.  3. High risk medication use: Patient has been maintained on digoxin for rate control given limited ability to use beta-blockade or nondihydropyridine calcium channel blocker secondary to relative hypotension.  She is not a candidate for rhythm control strategy with anticoagulation usage in the setting of her underlying liver disease and GI bleeding.  Most recent digoxin level 1.3 from 06/28/2019.  Most recent potassium of 3.5 from 07/30/2019 with goal potassium being 4.0.  Increase potassium rich foods.  He had been out of digoxin for several days prior to restarting this yesterday, 07/30/2019.  In this setting, we will defer rechecking a digoxin level until 08/06/2019.  Future order has been placed.  4. Alcoholic cirrhosis with ascites portal hypertension, and esophageal varices complicated by GI bleeding with acute blood loss anemia requiring blood transfusion: Follow-up with GI as directed.  Most recent CBC demonstrated stable hemoglobin as outlined above.  Disposition: F/u with Dr. Rockey Situ or an APP in 3 months.   Medication Adjustments/Labs and Tests Ordered: Current medicines are reviewed at length with the patient today.  Concerns regarding medicines are outlined above. Medication changes, Labs and Tests ordered today are summarized above and listed in the Patient Instructions accessible in Encounters.   Signed, Christell Faith, PA-C  07/31/2019 8:14 AM     [facility] [address] [place], [place] [address] [phone]

## 2019-07-31 NOTE — Patient Instructions (Signed)
Medication Instructions:   1. Your physician recommends that you continue on your current medications as directed. Please refer to the Current Medication list given to you today.  *If you need a refill on your cardiac medications before your next appointment, please call your pharmacy*  Lab Work:  1. Your physician recommends that you return for lab work in: 1 weeks at the medical mall.  No appt is needed. Hours are M-F 7AM- 6 PM.   If you have labs (blood work) drawn today and your tests are completely normal, you will receive your results only by: [name] MyChart Message (if you have MyChart) OR . A paper copy in the mail If you have any lab test that is abnormal or we need to change your treatment, we will call you to review the results.  Testing/Procedures:  1. None ordered  Follow-Up: At [facility], you and your health needs are our priority.  As part of our continuing mission to provide you with exceptional heart care, we have created designated Provider Care Teams.  These Care Teams include your primary Cardiologist (physician) and Advanced Practice Providers (APPs -  Physician Assistants and Nurse Practitioners) who all work together to provide you with the care you need, when you need it.  Your next appointment:   3 month(s)  The format for your next appointment:   In Person  Provider:   Ida Rogue, MD

## 2019-08-01 ENCOUNTER — Telehealth: Payer: Self-pay | Admitting: Gastroenterology

## 2019-08-01 NOTE — Telephone Encounter (Signed)
Patient called about a call she received saying she had an u/s scheduled by Dr Marius Ditch for 08-02-19 per the note on 07-30-19 she was to have a paracentesis  On 08-04-19. Please call an advise.

## 2019-08-01 NOTE — Telephone Encounter (Signed)
Informed patient that her U/s was scheduled tomorrow. Patient verbalized understanding

## 2019-08-02 ENCOUNTER — Other Ambulatory Visit: Payer: Self-pay

## 2019-08-02 ENCOUNTER — Ambulatory Visit
Admission: RE | Admit: 2019-08-02 | Discharge: 2019-08-02 | Disposition: A | Discharge status: Home / Self Care | Payer: Medicare Other | Source: Ambulatory Visit | Attending: Gastroenterology | Admitting: Gastroenterology

## 2019-08-02 DIAGNOSIS — K7290 Hepatic failure, unspecified without coma: Secondary | ICD-10-CM | POA: Insufficient documentation

## 2019-08-02 DIAGNOSIS — K7460 Unspecified cirrhosis of liver: Secondary | ICD-10-CM

## 2019-08-02 DIAGNOSIS — R188 Other ascites: Secondary | ICD-10-CM | POA: Diagnosis not present

## 2019-08-02 IMAGING — US US PARACENTESIS
1 series · 5 of 5 positions shown · non-contrast
Comparison: none

INDICATION: Cirrhosis.

[Series 1: us paracentesis · 5 of 5 slices shown]
[im 1/5]
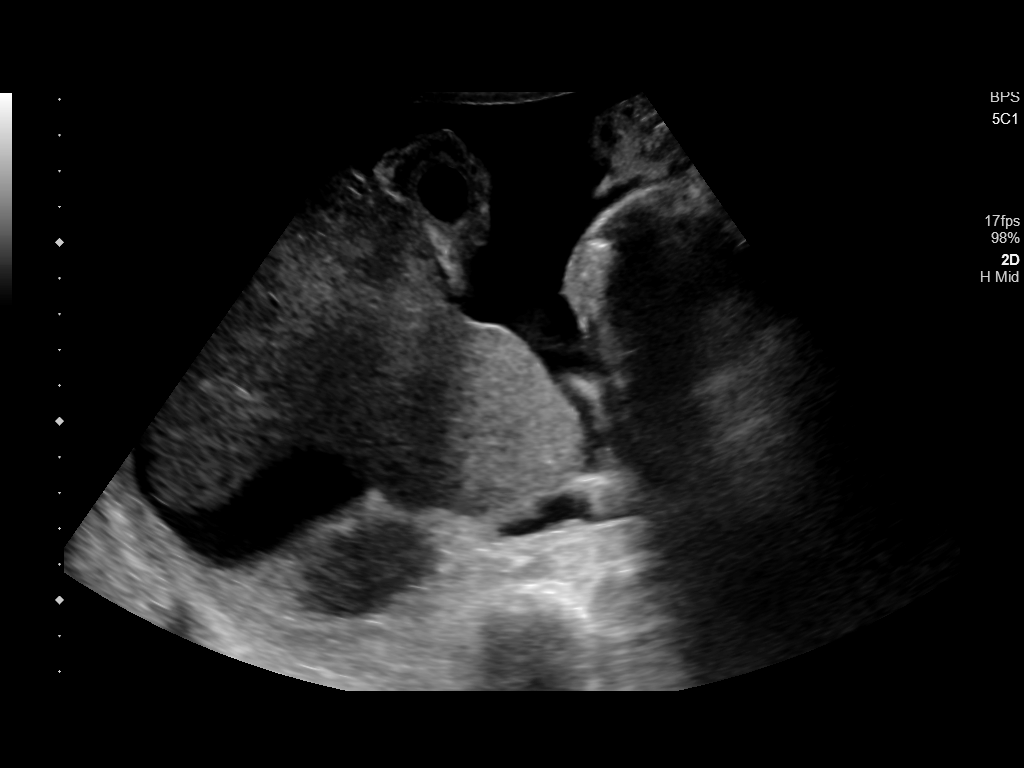
[im 2/5]
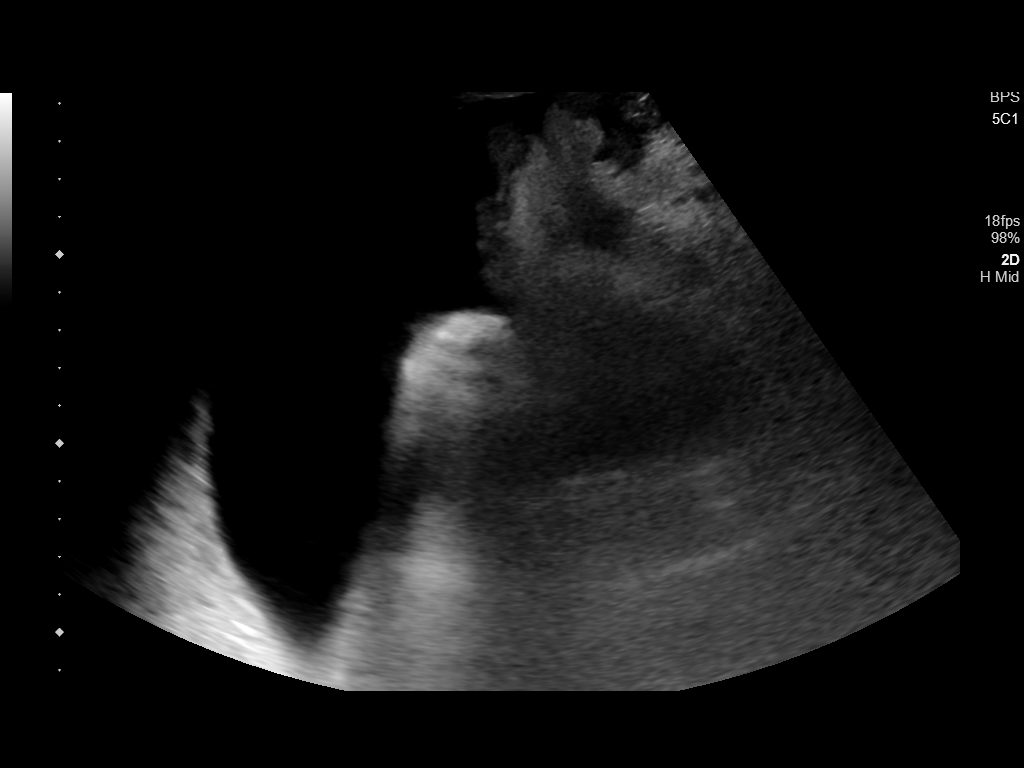
[im 3/5]
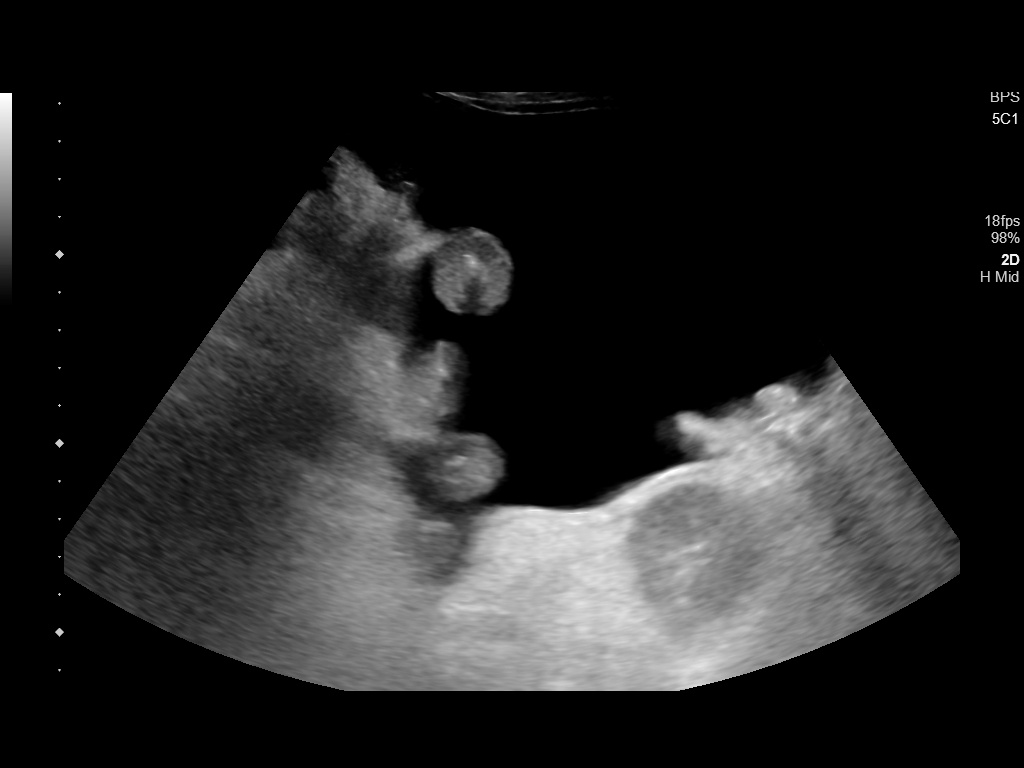
[im 4/5]
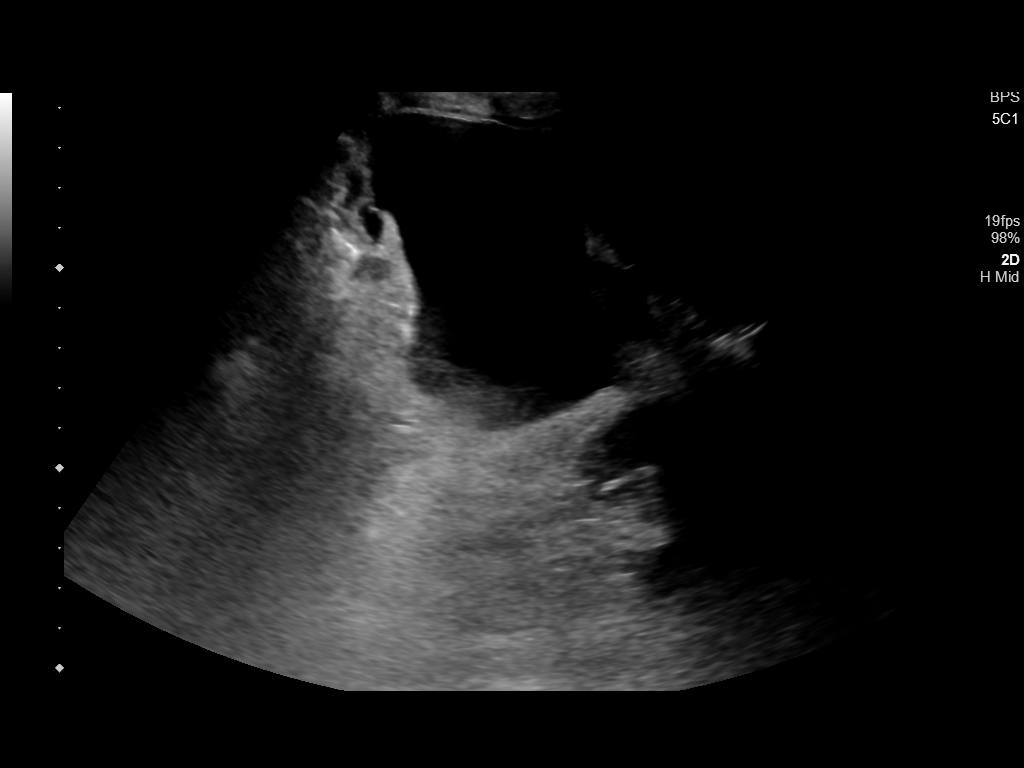
[im 5/5]
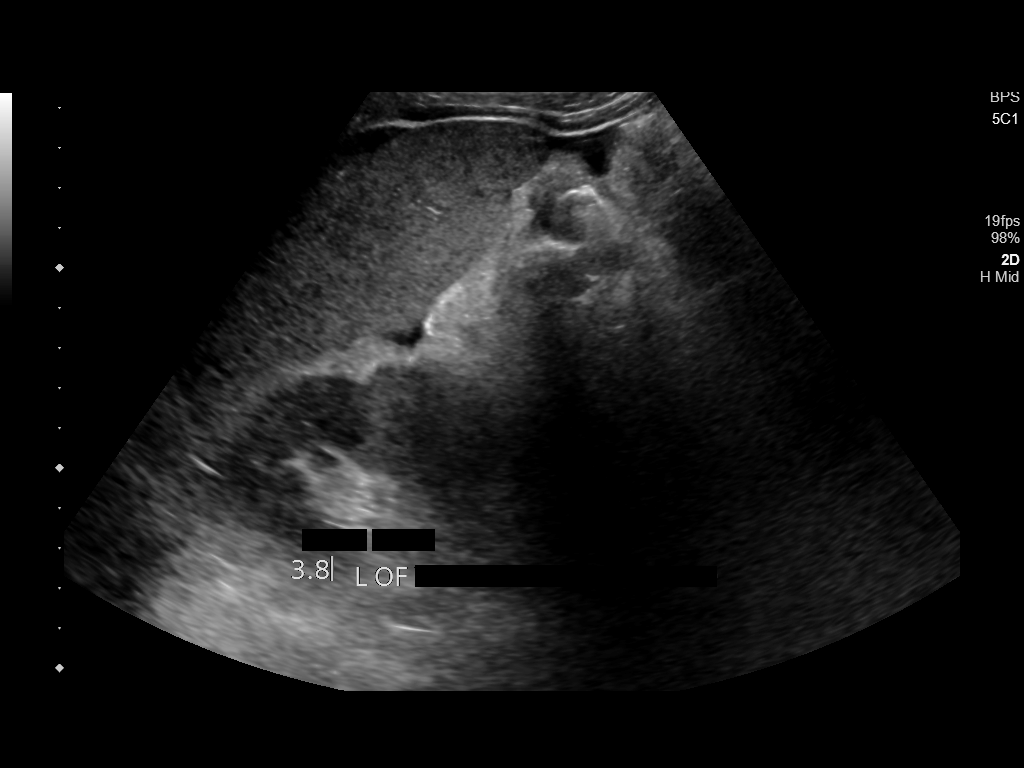

[5 of 5 positions shown; findings below may reference images not displayed]

EXAM:
ULTRASOUND GUIDED  PARACENTESIS

MEDICATIONS:
None.

COMPLICATIONS:
None immediate.

PROCEDURE:
Informed written consent was obtained from the patient after a
discussion of the risks, benefits and alternatives to treatment. A
timeout was performed prior to the initiation of the procedure.

Initial ultrasound scanning demonstrates a large amount of ascites
within the right lower abdominal quadrant. The right lower abdomen
was prepped and draped in the usual sterile fashion. 1% lidocaine
was used for local anesthesia.

Following this, a 6 French catheter was introduced. An ultrasound
image was saved for documentation purposes. The paracentesis was
performed. The catheter was removed and a dressing was applied. The
patient tolerated the procedure well without immediate post
procedural complication.
Patient received post-procedure intravenous albumin; see nursing
notes for details.
FINDINGS: A total of approximately 3.8 L of yellow fluid fluid was removed.
IMPRESSION: Successful ultrasound-guided paracentesis yielding 3.8 liters of
peritoneal fluid.

## 2019-08-07 ENCOUNTER — Other Ambulatory Visit
Admission: RE | Admit: 2019-08-07 | Discharge: 2019-08-07 | Disposition: A | Discharge status: Home / Self Care | Payer: Medicare Other | Attending: Physician Assistant | Admitting: Physician Assistant

## 2019-08-07 ENCOUNTER — Telehealth: Payer: Self-pay | Admitting: Gastroenterology

## 2019-08-07 ENCOUNTER — Other Ambulatory Visit: Payer: Self-pay

## 2019-08-07 ENCOUNTER — Telehealth: Payer: Self-pay

## 2019-08-07 DIAGNOSIS — I4819 Other persistent atrial fibrillation: Secondary | ICD-10-CM | POA: Insufficient documentation

## 2019-08-07 LAB — DIGOXIN LEVEL: Digoxin Level: 0.8 ng/mL (ref 0.8–2.0)

## 2019-08-07 NOTE — Telephone Encounter (Signed)
Call to patient to make her aware of note from Christell Faith, [place]. She will contact her GI to make sure they are aware of note.   Advised pt to call for any further questions or concerns.

## 2019-08-07 NOTE — Telephone Encounter (Signed)
Patient called & states she has received cardiology clearance to have the test.    (DR Vanga's patient)

## 2019-08-07 NOTE — Telephone Encounter (Signed)
-----   Message from Rise Mu, PA-C sent at 08/07/2019 11:40 AM EST ----- Please inform patient digoxin level is at goal.  No changes in medication.  Follow-up as planned.

## 2019-08-07 NOTE — Telephone Encounter (Signed)
This was discussed at her last visit. She needs a TIPS procedure for decompensated hepatic cirrhosis. It is in my note from that visit. No further cardiac workup is needed. She is low risk for noncardiac surgery. It is up to her hepatologist regarding moving forward with noncardiac surgery.   From office visit on 07/31/2019:  1. Preprocedure cardiac evaluation: Patient is planning to undergo TIPS procedure.  Per Revised Cardiac Index, she is low risk for noncardiac procedure with a 0.9% estimated rate of adverse event.  She is able to achieve greater than 4 METs without cardiac limitation.  She is able to proceed for noncardiac procedure without any further cardiac work-up at low cardiac risk.

## 2019-08-07 NOTE — Telephone Encounter (Signed)
I am forwarding to you. She is cleared for the TIPS procedure

## 2019-08-07 NOTE — Telephone Encounter (Signed)
Call to patient to review labs. Pt verbalized understanding and wanted to know if this means that she will be having surgery now.   No new orders at this time. Reaching out to provider for patient question.

## 2019-08-08 ENCOUNTER — Ambulatory Visit
Admission: RE | Admit: 2019-08-08 | Discharge: 2019-08-08 | Disposition: A | Discharge status: Home / Self Care | Payer: Medicare Other | Source: Ambulatory Visit | Attending: Gastroenterology | Admitting: Gastroenterology

## 2019-08-08 ENCOUNTER — Other Ambulatory Visit: Payer: Self-pay | Admitting: Interventional Radiology

## 2019-08-08 DIAGNOSIS — K7031 Alcoholic cirrhosis of liver with ascites: Secondary | ICD-10-CM

## 2019-08-08 DIAGNOSIS — K7460 Unspecified cirrhosis of liver: Secondary | ICD-10-CM | POA: Diagnosis not present

## 2019-08-08 DIAGNOSIS — K7290 Hepatic failure, unspecified without coma: Secondary | ICD-10-CM

## 2019-08-08 HISTORY — PX: IR RADIOLOGIST EVAL & MGMT: IMG5224

## 2019-08-08 NOTE — Consult Note (Signed)
Chief Complaint: Patient was consulted remotely today (TeleHealth) for complicated cirrhosis at the request of [name].    Referring Physician(s): [name]  History of Present Illness: Anita Reed is a 58 y.o. female presents for consultation for possible TIPS.  Patien with history of hepatic cirrhosis, Hypertension, remote history of lymphoma treated 11 years ago.  History of chronic hep C, treated in the past, with undetectable viral load. 01/18/2019 presented to ED with symptomatic abdominal ascites, attributed to hepatic cirrhosis 01/28/2019 presented with shortness of breath, weakness, black stools.  Endoscopy revealed portal gastropathy, Dieulafoy  lesion which was clipped. 06/01/2019 presented to ED with melena and hematemesis.  EGD demonstrated large esophageal varices and severe portal gastropathy.  Also diagnosed with C. difficile colitis  06/22/2019 admitted for abdominal pain and sepsis 07/14/2019 admitted from ED with bright red blood per rectum, attributed to recurrent C. difficile colitis which resolved on vancomycin  She has required 9 ultrasound-guided paracentesis procedures since May, becoming more frequent and generally large volume up to 5 L.  She does not  endorse any history of  encephalopathy.  She has recently stopped ethanol abuse.  Past Medical History:  Diagnosis Date   Anemia    Atrial flutter ([place])    Cancer (HCC)    Cirrhosis ([place])    Esophageal varices ([place])    Hypertension    Portal hypertension ([place])     Past Surgical History:  Procedure Laterality Date   ABDOMINAL HYSTERECTOMY     APPENDECTOMY     COLONOSCOPY N/A 06/03/2019   Procedure: COLONOSCOPY;  Surgeon: Toledo, Benay Pike, MD;  Location: [facility] ENDOSCOPY;  Service: Gastroenterology;  Laterality: N/A;   ESOPHAGOGASTRODUODENOSCOPY N/A 01/29/2019   Procedure: ESOPHAGOGASTRODUODENOSCOPY (EGD);  Surgeon: [name], MD;  Location: [facility] ENDOSCOPY;   Service: Gastroenterology;  Laterality: N/A;   ESOPHAGOGASTRODUODENOSCOPY N/A 06/01/2019   Procedure: ESOPHAGOGASTRODUODENOSCOPY (EGD);  Surgeon: Toledo, Benay Pike, MD;  Location: [facility] ENDOSCOPY;  Service: Gastroenterology;  Laterality: N/A;   TONSILLECTOMY      Allergies: Patient has no known allergies.  Medications: Prior to Admission medications   Medication Sig Start Date End Date Taking? Authorizing Provider  albumin human 25 % bottle Inject 50 grams no matter the amount removed. 07/30/19   [name], MD  digoxin (LANOXIN) 0.125 MG tablet Take 1 tablet (0.125 mg total) by mouth daily. 07/30/19   Hubbard Hartshorn, FNP  FLUoxetine (PROZAC) 10 MG capsule Take 1 capsule (10 mg total) by mouth daily. 07/27/19   Hubbard Hartshorn, FNP  furosemide (LASIX) 40 MG tablet Take 0.5 tablets (20 mg total) by mouth daily. 06/28/19   Max Sane, MD  gabapentin (NEURONTIN) 300 MG capsule Take 1 tablet once daily at night for 5 days, then increase to 1 tablet twice daily. 07/27/19   Hubbard Hartshorn, FNP  midodrine (PROAMATINE) 5 MG tablet Take 1 tablet (5 mg total) by mouth 3 (three) times daily with meals. 07/27/19   Hubbard Hartshorn, FNP  tiZANidine (ZANAFLEX) 4 MG capsule Take 1 capsule (4 mg total) by mouth 2 (two) times daily. 07/27/19 08/26/19  Hubbard Hartshorn, FNP     Family History  Problem Relation Age of Onset   Cancer Mother    Cancer Father     Social History   Socioeconomic History   Marital status: Married    Spouse name: [name]   Number of children: 1   Years of education: 9   Highest  education level: 9th grade  Occupational History   Occupation: disabled  Scientist, product/process development strain: Somewhat hard   Food insecurity    Worry: Sometimes true    Inability: Sometimes true   Transportation needs    Medical: No    Non-medical: No  Tobacco Use   Smoking status: Current Every Day Smoker    Packs/day: 1.00    Types: Cigarettes   Smokeless  tobacco: Never Used  Substance and Sexual Activity   Alcohol use: Not Currently   Drug use: Never   Sexual activity: Not Currently  Lifestyle   Physical activity    Days per week: 0 days    Minutes per session: 0 min   Stress: Only a little  Relationships   Press photographer on phone: Once a week    Gets together: Never    Attends religious service: Never    Active member of club or organization: No    Attends meetings of clubs or organizations: Never    Relationship status: Married  Other Topics Concern   Not on file  Social History Narrative   Not on file    ECOG Status: 1 - Symptomatic but completely ambulatory  Review of Systems  Review of Systems: A 12 point ROS discussed and pertinent positives are indicated in the HPI above.  All other systems are negative.  Physical Exam No direct physical exam was performed (except for noted visual exam findings with Video Visits).     Vital Signs: There were no vitals taken for this visit.  Imaging: Ct Abdomen Pelvis W Contrast  Result Date: 07/14/2019 CLINICAL DATA:  Abdominal pain, bloody stools EXAM: CT ABDOMEN AND PELVIS WITH CONTRAST TECHNIQUE: Multidetector CT imaging of the abdomen and pelvis was performed using the standard protocol following bolus administration of intravenous contrast. CONTRAST:  162mL OMNIPAQUE IOHEXOL 300 MG/ML  SOLN COMPARISON:  06/23/2019 FINDINGS: Lower chest: No acute abnormality Hepatobiliary: Nodular contours of the liver compatible with cirrhosis. Heterogeneous enhancement. Concern for possible mass in the inferior right hepatic lobe versus regenerating nodule measuring 5.5 cm. No biliary ductal dilatation. Gallbladder is distended. Pancreas: No focal abnormality or ductal dilatation. Spleen: Splenomegaly with a craniocaudal length of 14.4 cm. Adrenals/Urinary Tract: No adrenal abnormality. No focal renal abnormality. No stones or hydronephrosis. Urinary bladder is unremarkable.  Stomach/Bowel: Stomach, large and small bowel grossly unremarkable. Vascular/Lymphatic: Aortic atherosclerosis. No enlarged abdominal or pelvic lymph nodes. Reproductive: Prior hysterectomy.  No adnexal masses. Other: Large volume ascites in the abdomen and pelvis. Musculoskeletal: No acute bony abnormality. IMPRESSION: Changes of cirrhosis with splenomegaly and large volume ascites. 5.5 cm exophytic appearing masslike area off the inferior medial right hepatic lobe. This could reflect regenerating nodule, but recommend further evaluation with elective MRI of the liver to exclude hepatocellular carcinoma. Aortic atherosclerosis. Electronically Signed   By: Rolm Baptise M.D.   On: 07/14/2019 19:35   Mr Liver W [id number] Contrast  Result Date: 07/15/2019 CLINICAL DATA:  Abdominal pain.  Cirrhosis.  Evaluate liver lesion. EXAM: MRI ABDOMEN WITHOUT AND WITH CONTRAST TECHNIQUE: Multiplanar multisequence MR imaging of the abdomen was performed both before and after the administration of intravenous contrast. CONTRAST:  58mL GADAVIST GADOBUTROL 1 MMOL/ML IV SOLN COMPARISON:  CT AP 07/14/2019 FINDINGS: Lower chest: No acute findings. Hepatobiliary: The liver is cirrhotic with hypertrophy of the caudate lobe and lateral segment of left lobe. Contour the liver is lobular. Diffusely heterogeneous enhancement is identified throughout both lobes. No  focal areas of masslike enhancement identified. On the delayed images there are no abnormal areas of washout specific for HCC. Moderate distension of the gallbladder. No gallstones, gallbladder wall thickening. There is increase caliber of the CBD which measures 1.1 cm in maximum thickness. No choledocholithiasis or obstructing mass. Pancreas: No mass, inflammatory changes, or other parenchymal abnormality identified. Spleen:  The spleen is enlarged measuring 13.7 cm in length. Adrenals/Urinary Tract: Normal appearance of the adrenal glands. No kidney mass or hydronephrosis identified.  Stomach/Bowel: Visualized portions within the abdomen are unremarkable. Vascular/Lymphatic: Aortic atherosclerosis without aneurysm. Small esophageal varices. Left upper quadrant varices also noted. No adenopathy. Other:  There is a large volume of ascites within the abdomen. Musculoskeletal: No suspicious bone lesions identified. IMPRESSION: 1. Morphologic features of liver compatible with cirrhosis. No specific findings identified to suggest hepatoma. 2. Stigmata of portal venous hypertension including splenomegaly, esophageal varices and ascites. Electronically Signed   By: Kerby Moors M.D.   On: 07/15/2019 04:16   US Paracentesis  Result Date: 08/02/2019 INDICATION: Cirrhosis. EXAM: ULTRASOUND GUIDED  PARACENTESIS MEDICATIONS: None. COMPLICATIONS: None immediate. PROCEDURE: Informed written consent was obtained from the patient after a discussion of the risks, benefits and alternatives to treatment. A timeout was performed prior to the initiation of the procedure. Initial ultrasound scanning demonstrates a large amount of ascites within the right lower abdominal quadrant. The right lower abdomen was prepped and draped in the usual sterile fashion. 1% lidocaine was used for local anesthesia. Following this, a 6 French catheter was introduced. An ultrasound image was saved for documentation purposes. The paracentesis was performed. The catheter was removed and a dressing was applied. The patient tolerated the procedure well without immediate post procedural complication. Patient received post-procedure intravenous albumin; see nursing notes for details. FINDINGS: A total of approximately 3.8 L of yellow fluid fluid was removed. IMPRESSION: Successful ultrasound-guided paracentesis yielding 3.8 liters of peritoneal fluid. Electronically Signed   By: Marcello Moores  Register   On: 08/02/2019 15:05   US Paracentesis  Result Date: 07/23/2019 INDICATION: Decompensated hepatic cirrhosis. Please perform  ultrasound-guided paracentesis for therapeutic purposes. EXAM: ULTRASOUND-GUIDED PARACENTESIS COMPARISON:  Multiple previous ultrasound-guided paracenteses, most recently 07/16/2019 yielding 5.1 L of peritoneal fluid. MEDICATIONS: None. COMPLICATIONS: None immediate. TECHNIQUE: Informed written consent was obtained from the patient after a discussion of the risks, benefits and alternatives to treatment. A timeout was performed prior to the initiation of the procedure. Initial ultrasound scanning demonstrates a moderate amount of ascites within the right lower abdominal quadrant. The right lower abdomen was prepped and draped in the usual sterile fashion. 1% lidocaine with epinephrine was used for local anesthesia. An ultrasound image was saved for documentation purposed. An 8 Fr Safe-T-Centesis catheter was introduced. The paracentesis was performed. The catheter was removed and a dressing was applied. The patient tolerated the procedure well without immediate post procedural complication. FINDINGS: A total of approximately 2.9 liters of serous fluid was removed. IMPRESSION: Successful ultrasound-guided paracentesis yielding 2.9 liters of peritoneal fluid. Electronically Signed   By: Sandi Mariscal M.D.   On: 07/23/2019 12:10   US Paracentesis  Result Date: 07/16/2019 INDICATION: History of cirrhosis. Recurrent abdominal distention and ascites. Request diagnostic and therapeutic paracentesis. EXAM: ULTRASOUND GUIDED RIGHT LOWER QUADRANT PARACENTESIS MEDICATIONS: None. COMPLICATIONS: None immediate. PROCEDURE: Informed written consent was obtained from the patient after a discussion of the risks, benefits and alternatives to treatment. A timeout was performed prior to the initiation of the procedure. Initial ultrasound scanning demonstrates a large amount of  ascites within the right lower abdominal quadrant. The right lower abdomen was prepped and draped in the usual sterile fashion. 1% lidocaine was used for local  anesthesia. Following this, a 6 Fr Safe-T-Centesis catheter was introduced. An ultrasound image was saved for documentation purposes. The paracentesis was performed. The catheter was removed and a dressing was applied. The patient tolerated the procedure well without immediate post procedural complication. FINDINGS: A total of approximately 5.1 L of clear yellow fluid was removed. Samples were sent to the laboratory as requested by the clinical team. IMPRESSION: Successful ultrasound-guided paracentesis yielding 5.1 liters of peritoneal fluid. Read by: Ascencion Dike PA-C Electronically Signed   By: Markus Daft M.D.   On: 07/16/2019 11:24   US Paracentesis  Result Date: 07/12/2019 INDICATION: 58 year old with decompensated hepatic cirrhosis. Recurrent ascites. EXAM: ULTRASOUND GUIDED PARACENTESIS MEDICATIONS: None. COMPLICATIONS: None immediate. PROCEDURE: Informed written consent was obtained from the patient after a discussion of the risks, benefits and alternatives to treatment. A timeout was performed prior to the initiation of the procedure. Initial ultrasound scanning demonstrates a large amount of ascites within the right lower abdominal quadrant. The right lower abdomen was prepped and draped in the usual sterile fashion. 1% lidocaine was used for local anesthesia. Following this, a 6 Fr Safe-T-Centesis catheter was introduced. An ultrasound image was saved for documentation purposes. The paracentesis was performed. The catheter was removed and a dressing was applied. The patient tolerated the procedure well without immediate post procedural complication. Patient received post-procedure intravenous albumin; see nursing notes for details. FINDINGS: A total of approximately 5.7 L of amber fluid was removed. IMPRESSION: Successful ultrasound-guided paracentesis yielding 5.7 liters of peritoneal fluid. Electronically Signed   By: Markus Daft M.D.   On: 07/12/2019 16:27    Labs:  CBC: Recent Labs     07/15/19 0433  07/15/19 1542 07/16/19 0629 07/17/19 0820 07/30/19 0000  WBC 18.8*  --   --  19.3* 11.7* 11.3*  HGB 9.1*   < > 13.2 12.0 13.5 12.6  HCT 28.1*   < > 41.6 38.7 42.6 38.1  PLT 187  --   --  185 175 225   < > = values in this interval not displayed.    COAGS: Recent Labs    06/01/19 1251 06/22/19 1600 06/23/19 0522 07/14/19 1754  INR 1.4* 1.2 1.3* 1.2  APTT 29 30  --  30    BMP: Recent Labs    07/15/19 0433 07/16/19 0629 07/17/19 0820 07/30/19 0000  NA 136 139 136 137  K 3.1* 4.2 4.0 3.5  CL 103 105 105 103  CO2 25 27 22 20   GLUCOSE 98 96 86 132*  BUN 8 <5* 7 9  CALCIUM 7.7* 8.6* 8.7* 8.6  CREATININE 0.54 0.41* 0.49 0.33*  GFRNONAA >60 >60 >60 123  GFRAA >60 >60 >60 143    LIVER FUNCTION TESTS: Recent Labs    06/24/19 0511 06/25/19 0418 06/28/19 0321 07/14/19 1754 07/30/19 0000  BILITOT 1.4* 1.3* 1.2 2.1*   2.1* 1.8*  AST 38 36 38 57*   57* 69*  ALT 15 15 16 20   20  30*  ALKPHOS 367* 411* 402* 354*   361*  --   PROT 4.6* 4.5* 4.5* 5.8*   5.8* 5.3*  ALBUMIN 2.5* 2.4* 2.4* 3.3*   3.3*  --     TUMOR MARKERS: No results for input(s): AFPTM, CEA, CA199, CHROMGRNA in the last 8760 hours.  MELD score 11  Assessment and Plan:  My impression is that this patient has hepatic cirrhosis complicated by recurrent large volume symptomatic abdominal ascites, and 2 recent episodes of upper GI bleeding.  She is currently stable and functional, without encephalopathy.  With her meld score 11, she would be candidate for consideration of TIPS to decrease her portal venous hypertension and ameliorate her abdominal ascites accumulation.  I spent the majority the consultation discussing with the patient the pathophysiology of hepatic cirrhosis, portal venous hypertension, recurrent ascites, dilated collateral portosystemic venous pathways resulting in esophageal varices, GI bleeding and portal gastropathy.  We discussed in detail the TIPS technique, anticipated  benefits, possible risks and side effects, and alternatives.  We discussed that cirrhosis is irreversible and can only be treated with liver transplant.  We discussed that presence of a TIPS is not a contraindication for liver transplant.  We discussed the moderate likelihood of a degree of hepatic encephalopathy secondary to TIPS creation which would require p.o. lactulose for good control.  She seemed to understand and did ask appropriate questions.  She is motivated to proceed.  From an anatomic standpoint we know from her MRI that her portal and hepatic venous systems are patent and approachable for TIPS creation.  There is no evidence of hepatic mass or other contraindication. Accordingly, we can set her up for elective TIPS creation under general anesthesia, with plans for extended outpatient recovery.  This will need to be done at [facility] as we do not have adequate equipment in IR at [facility].  Thank you for this interesting consult.  I greatly enjoyed meeting Anita Reed and look forward to participating in their care.  A copy of this report was sent to the requesting provider on this date.  Electronically Signed: Rickard Rhymes 08/08/2019, 1:50 PM   I spent a total of  40 Minutes   in remote  clinical consultation, greater than 50% of which was counseling/coordinating care for hepatic cirrhosis, possible TIPS.    Visit type: Audio only (telephone). Audio (no video) only due to patient's lack of internet/smartphone capability. Alternative for in-person consultation at [facility], [address], [place], [place]. This visit type was conducted due to national recommendations for restrictions regarding the COVID-19 Pandemic (e.g. social distancing).  This format is felt to be most appropriate for this patient at this time.  All issues noted in this document were discussed and addressed.

## 2019-08-08 NOTE — Telephone Encounter (Signed)
We sent referral for the TIPS.

## 2019-08-13 ENCOUNTER — Encounter: Payer: Self-pay | Admitting: Family Medicine

## 2019-08-13 ENCOUNTER — Other Ambulatory Visit: Payer: Self-pay

## 2019-08-13 ENCOUNTER — Telehealth: Payer: Self-pay | Admitting: Gastroenterology

## 2019-08-13 ENCOUNTER — Ambulatory Visit (INDEPENDENT_AMBULATORY_CARE_PROVIDER_SITE_OTHER): Payer: Medicare Other | Admitting: Family Medicine

## 2019-08-13 DIAGNOSIS — M62838 Other muscle spasm: Secondary | ICD-10-CM | POA: Diagnosis not present

## 2019-08-13 DIAGNOSIS — G8929 Other chronic pain: Secondary | ICD-10-CM | POA: Diagnosis not present

## 2019-08-13 MED ORDER — TIZANIDINE HCL 4 MG PO CAPS: 4.0000 mg | ORAL_CAPSULE | Freq: Three times a day (TID) | ORAL | 0 refills | Status: DC | PRN

## 2019-08-13 MED ORDER — GABAPENTIN 400 MG PO CAPS: 400.0000 mg | ORAL_CAPSULE | Freq: Three times a day (TID) | ORAL | 1 refills | Status: DC

## 2019-08-13 NOTE — Telephone Encounter (Signed)
Please goahead and schedule one, make sure she gets atleast 25g albumin even if less than 4lit removed. They didn't give her last time  Thanks RV

## 2019-08-13 NOTE — Progress Notes (Signed)
Name: Anita Reed   MRN: [id number]    DOB: 01-18-1961   Date:08/13/2019       Progress Note  Subjective  Chief Complaint  Chief Complaint  Patient presents with  . Medication Problem    patient has increased her Gabapentin    I connected with  [name] on 08/13/19 at  9:00 AM EST by telephone and verified that I am speaking with the correct person using two identifiers.   I discussed the limitations, risks, security and privacy concerns of performing an evaluation and management service by telephone and the availability of in person appointments. Staff also discussed with the patient that there may be a patient responsible charge related to this service. Patient Location: Home Provider Location: Office Additional Individuals present: None  HPI  Pt presents with concern for neuropathy and chronic pain secondary to liver cirrhosis, and muscle spasticity.  She is prescribed to take 300mg  BID, however she is now taking TID. She is also taking 4mg  Tizandidine TID for her pain and ongoing muscle spasms. She is advised that she should not increase medication without discussing with me first and she verbalizes understanding.  She is doing PT regularly and they are helping with some stretching and strengthening.  She has telephone visit with palliative tomorrow - will discuss pain management with palliative tomorrow - I advised I prefer either management by palliative, or I can refer to pain management specialty.   Patient Active Problem List   Diagnosis Date Noted  . Dysthymia 07/27/2019  . Other chronic pain 07/27/2019  . Muscle spasticity 07/27/2019  . C. difficile colitis   . Hypokalemia 07/15/2019  . Bandemia 07/15/2019  . Hypotension   . Goals of care, counseling/discussion   . Palliative care encounter   . Muscle cramping 06/22/2019  . Alcohol abuse 06/22/2019  . Sepsis ([place]) 06/22/2019  . GIB (gastrointestinal bleeding) 06/01/2019  . GI bleeding 06/01/2019  . History  of lymphoma 02/26/2019  . Atrial flutter ([place]) 02/07/2019  . Alcoholic cirrhosis of liver with ascites ([place]) 02/07/2019  . Persistent atrial fibrillation ([place]) 02/07/2019  . Anemia 02/07/2019  . Anorexia 02/07/2019  . GI bleed 01/28/2019  . Ascites 01/18/2019  . Cancer [facility])     Social History   Tobacco Use  . Smoking status: Current Every Day Smoker    Packs/day: 1.00    Types: Cigarettes  . Smokeless tobacco: Never Used  Substance Use Topics  . Alcohol use: Not Currently     Current Outpatient Medications:  .  albumin human 25 % bottle, Inject 50 grams no matter the amount removed., Disp: 50 mL, Rfl: 0 .  digoxin (LANOXIN) 0.125 MG tablet, Take 1 tablet (0.125 mg total) by mouth daily., Disp: 30 tablet, Rfl: 0 .  FLUoxetine (PROZAC) 10 MG capsule, Take 1 capsule (10 mg total) by mouth daily., Disp: 30 capsule, Rfl: 3 .  furosemide (LASIX) 40 MG tablet, Take 0.5 tablets (20 mg total) by mouth daily., Disp: 30 tablet, Rfl: 0 .  midodrine (PROAMATINE) 5 MG tablet, Take 1 tablet (5 mg total) by mouth 3 (three) times daily with meals., Disp: 90 tablet, Rfl: 0 .  tiZANidine (ZANAFLEX) 4 MG capsule, Take 1 capsule (4 mg total) by mouth 2 (two) times daily., Disp: 180 capsule, Rfl: 0  Current Facility-Administered Medications:  .  albumin human 25 % solution 25 g, 25 g, Intravenous, Once, [place], Tally Due, MD  No Known Allergies  I personally reviewed active problem list, medication  list, allergies, notes from last encounter, lab results with the patient/caregiver today.  ROS  Ten systems reviewed and is negative except as mentioned in HPI  Objective  Virtual encounter, vitals not obtained.  There is no height or weight on file to calculate BMI.  Nursing Note and Vital Signs reviewed.  Physical Exam  Pulmonary/Chest: Effort normal. No respiratory distress. Speaking in complete sentences Neurological: Pt is alert and oriented to person, place, and time. Speech is  normal Psychiatric: Patient has a normal mood and affect. behavior is normal. Judgment and thought content normal.   No results found for this or any previous visit (from the past 72 hour(s)).  Assessment & Plan  1. Muscle spasticity - tiZANidine (ZANAFLEX) 4 MG capsule; Take 1 capsule (4 mg total) by mouth 3 (three) times daily as needed for muscle spasms.  Dispense: 270 capsule; Refill: 0  2. Other chronic pain - discussed gabapentin dosing at length, the need for caution, and to never increase dosing or stop/decrease dosing without contacting myself/our office first for instructions.  She verbalizes understanding.  Proceeding with increase to 400mg  TID dosing.  - gabapentin (NEURONTIN) 400 MG capsule; Take 1 capsule (400 mg total) by mouth 3 (three) times daily.  Dispense: 270 capsule; Refill: 1 - tiZANidine (ZANAFLEX) 4 MG capsule; Take 1 capsule (4 mg total) by mouth 3 (three) times daily as needed for muscle spasms.  Dispense: 270 capsule; Refill: 0  - I discussed the assessment and treatment plan with the patient. The patient was provided an opportunity to ask questions and all were answered. The patient agreed with the plan and demonstrated an understanding of the instructions.  - The patient was advised to call back or seek an in-person evaluation if the symptoms worsen or if the condition fails to improve as anticipated.  I provided 13 minutes of non-face-to-face time during this encounter.  Hubbard Hartshorn, FNP

## 2019-08-13 NOTE — Telephone Encounter (Signed)
-----   Message from [place], [place] sent at 08/13/2019 10:05 AM EST ----- Pt is calling requesting a paracentesis this week. She is scheduled for TIPS on 08/27/19 and was told they would do one before that but she doesn't feel she can wait that long. She isn't having troubles breathing right now. Please advise.

## 2019-08-14 ENCOUNTER — Other Ambulatory Visit: Payer: Self-pay

## 2019-08-14 ENCOUNTER — Telehealth: Payer: Self-pay | Admitting: Nurse Practitioner

## 2019-08-14 ENCOUNTER — Other Ambulatory Visit: Payer: Medicare Other | Admitting: Nurse Practitioner

## 2019-08-14 ENCOUNTER — Telehealth: Payer: Self-pay | Admitting: Gastroenterology

## 2019-08-14 ENCOUNTER — Other Ambulatory Visit: Payer: Self-pay | Admitting: Gastroenterology

## 2019-08-14 DIAGNOSIS — K7031 Alcoholic cirrhosis of liver with ascites: Secondary | ICD-10-CM

## 2019-08-14 MED ORDER — ALBUMIN HUMAN 25 % IV SOLN: 25.0000 g | Freq: Once | INTRAVENOUS | Status: DC

## 2019-08-14 NOTE — Telephone Encounter (Signed)
I called Anita Reed for scheduled telemedicine Palliative care visit, no answer message left with contact information

## 2019-08-14 NOTE — Telephone Encounter (Signed)
Pt scheduled for an US paracentesis tomorrow, 08/15/19. Pt has been notified [facility].

## 2019-08-14 NOTE — Telephone Encounter (Signed)
Patient called & requested a return call from [place].

## 2019-08-15 ENCOUNTER — Ambulatory Visit: Payer: Medicare Other

## 2019-08-15 ENCOUNTER — Other Ambulatory Visit: Payer: Self-pay

## 2019-08-15 ENCOUNTER — Ambulatory Visit
Admission: RE | Admit: 2019-08-15 | Discharge: 2019-08-15 | Disposition: A | Discharge status: Home / Self Care | Payer: Medicare Other | Source: Ambulatory Visit | Attending: Gastroenterology | Admitting: Gastroenterology

## 2019-08-15 DIAGNOSIS — K7031 Alcoholic cirrhosis of liver with ascites: Secondary | ICD-10-CM | POA: Diagnosis present

## 2019-08-15 IMAGING — US US PARACENTESIS
1 series · 9 of 9 positions shown · non-contrast
Comparison: none

INDICATION: Ascites due to alcoholic cirrhosis.

[Series 1: us paracentesis · 0.26mm/px · 9 of 9 slices shown]
[im 1/9]
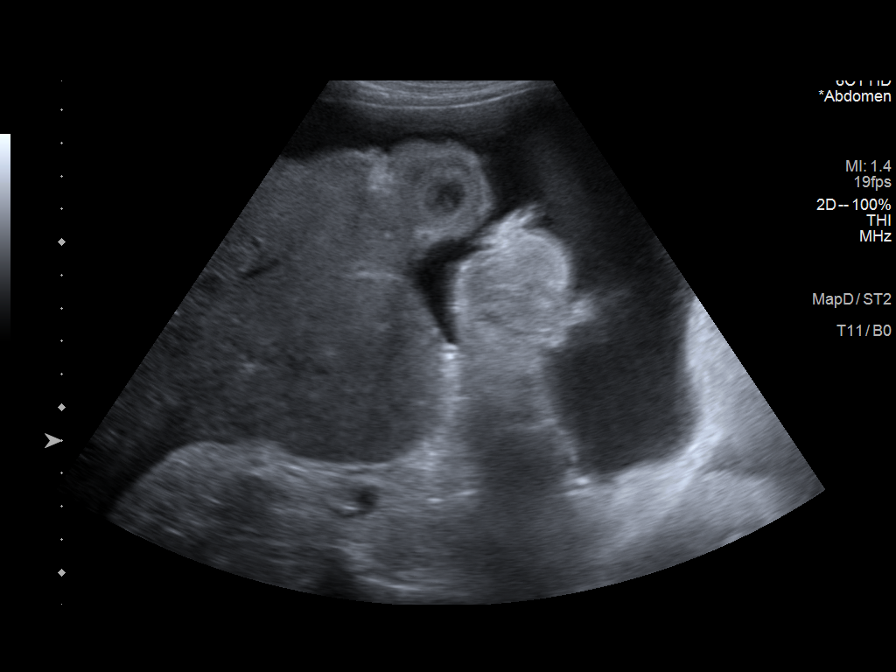
[im 2/9]
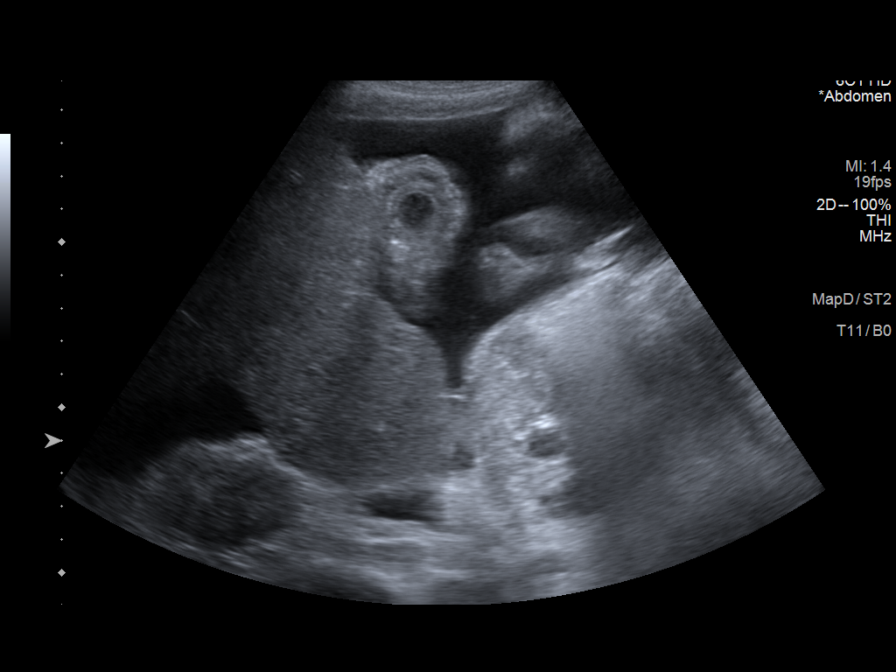
[im 3/9]
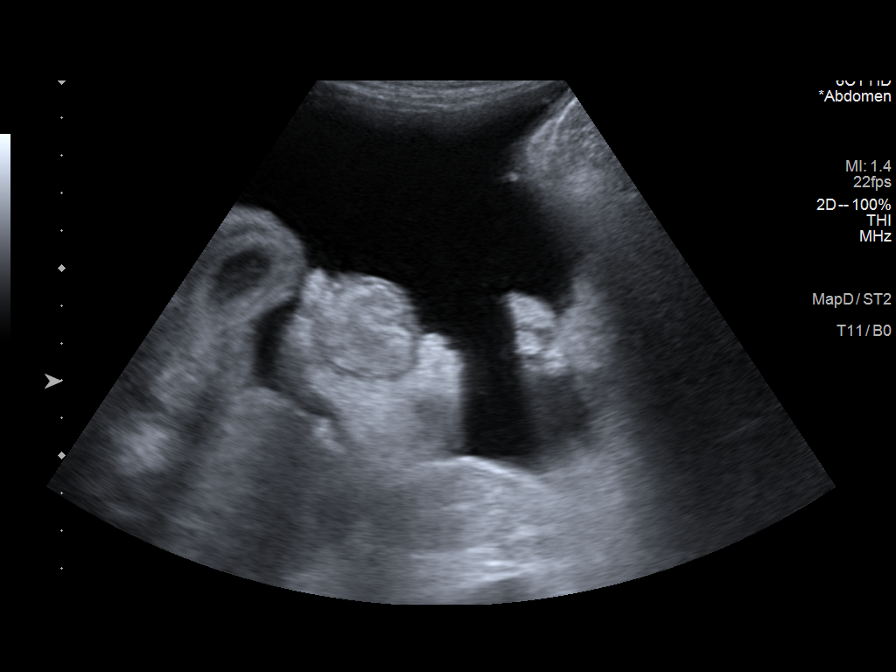
[im 4/9]
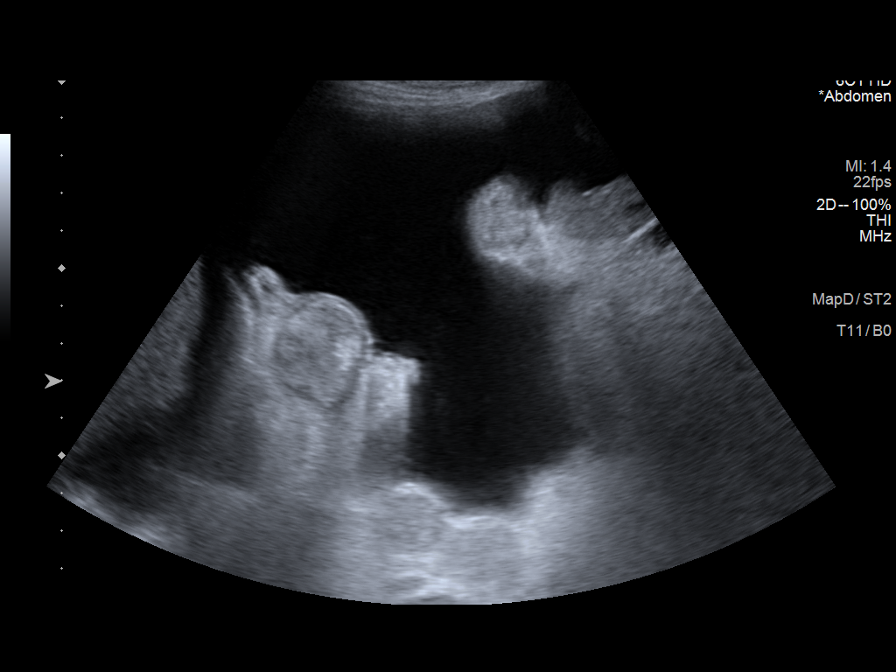
[im 5/9]
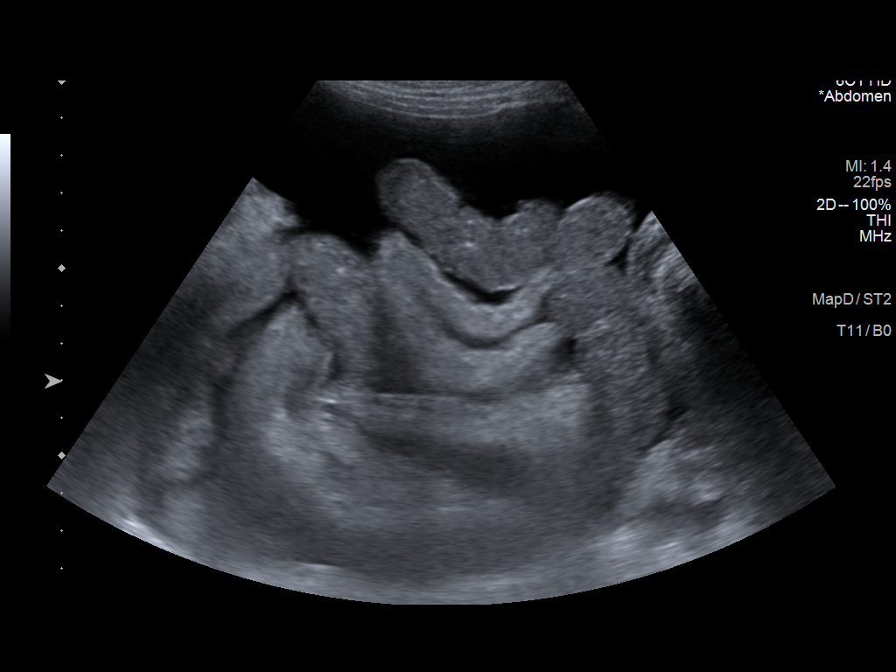
[im 6/9]
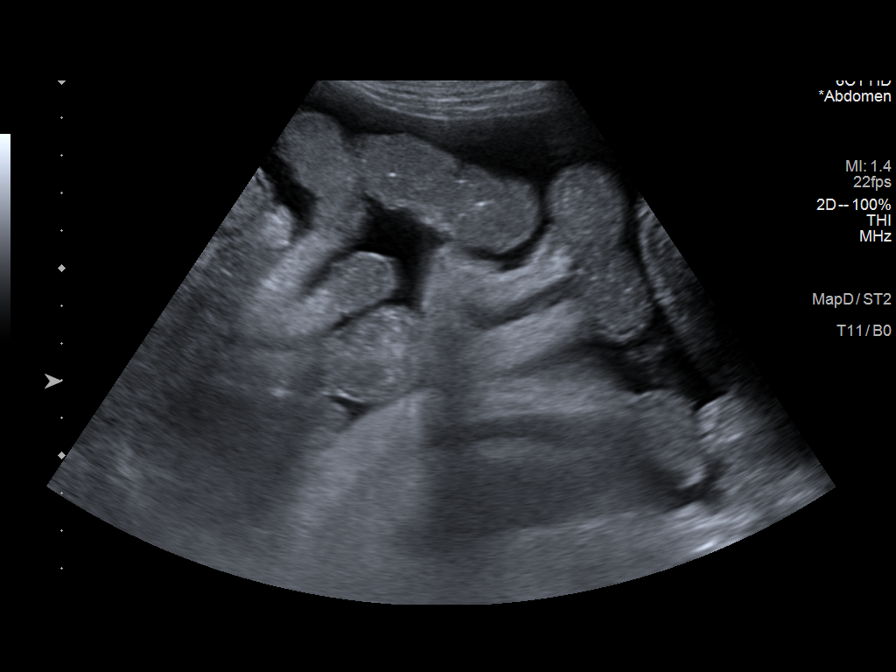
[im 7/9]
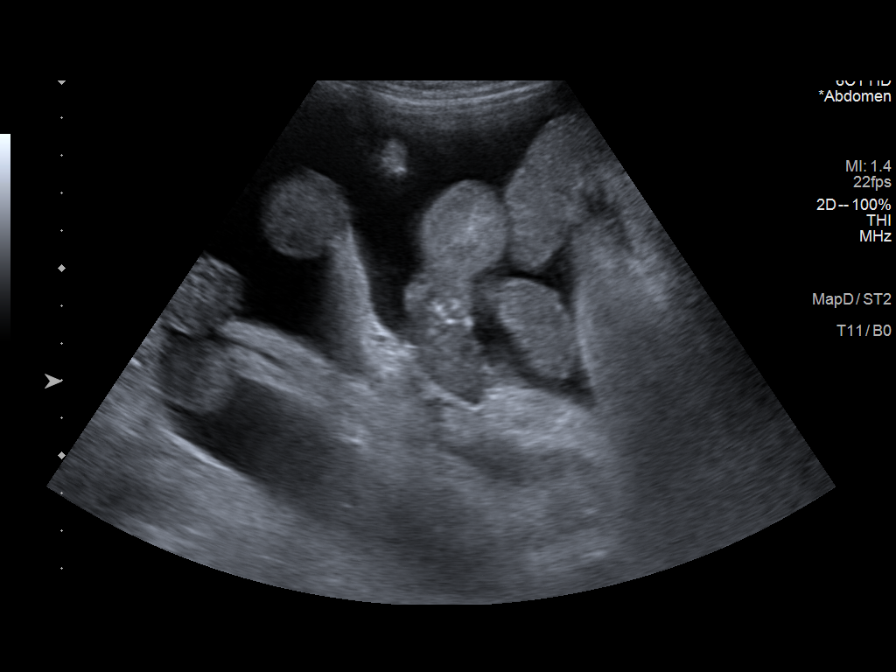
[im 8/9]
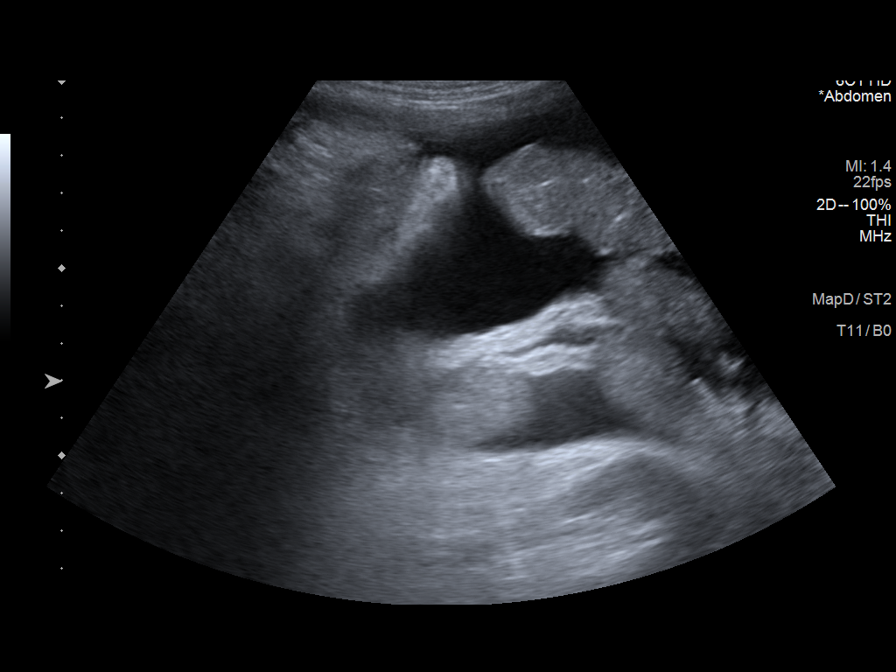
[im 9/9]
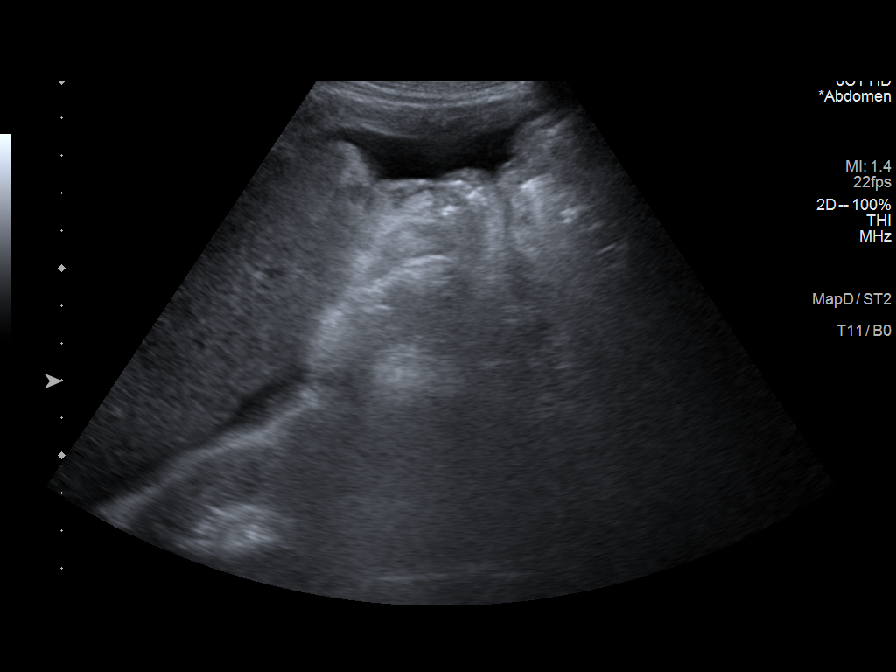

[9 of 9 positions shown; findings below may reference images not displayed]

EXAM:
ULTRASOUND GUIDED PARACENTESIS

MEDICATIONS:
None.

COMPLICATIONS:
None immediate.

PROCEDURE:
Informed written consent was obtained from the patient after a
discussion of the risks, benefits and alternatives to treatment. A
timeout was performed prior to the initiation of the procedure.

Initial ultrasound scanning demonstrates a large amount of ascites
within the right lower abdominal quadrant. The right lower abdomen
was prepped and draped in the usual sterile fashion. 1% lidocaine
was used for local anesthesia.

Initially, 6 French Safe-T-Centesis catheters were placed in the
right lower quadrant but there was minimal drainage and likely
related to adjacent bowel. Therefore, a 19 gauge, 7-cm, Yueh
catheter was introduced. An ultrasound image was saved for
documentation purposes. The paracentesis was performed. The catheter
was removed and a dressing was applied. The patient tolerated the
procedure well without immediate post procedural complication.
FINDINGS: A total of approximately 4.9 L of yellow fluid was removed.
IMPRESSION: Successful ultrasound-guided paracentesis yielding 4.9 L liters of
peritoneal fluid.

## 2019-08-15 MED ORDER — ALBUMIN HUMAN 25 % IV SOLN
INTRAVENOUS | Status: AC
  Filled 2019-08-15: qty 100

## 2019-08-15 MED ORDER — ALBUMIN HUMAN 25 % IV SOLN
25.0000 g | Freq: Once | INTRAVENOUS | Status: AC
  Administered 2019-08-15: 25 g via INTRAVENOUS

## 2019-08-15 NOTE — Telephone Encounter (Signed)
Contacted pt back. Stated she figured out what she was calling about. No further questions.

## 2019-08-15 NOTE — Procedures (Signed)
Interventional Radiology Procedure:   Indications: Ascites  Procedure: US guided paracentesis  Findings: Removed 4900 ml from right lower quadrant  Complications: None     EBL: Less than 10 ml   Anita Reed R. Anselm Pancoast, MD  Pager: [phone]

## 2019-08-21 ENCOUNTER — Encounter (HOSPITAL_COMMUNITY): Payer: Self-pay | Admitting: Interventional Radiology

## 2019-08-21 NOTE — Anesthesia Preprocedure Evaluation (Addendum)
Anesthesia Evaluation  Patient identified by MRN, date of birth, ID band Patient awake    Reviewed: Allergy & Precautions, NPO status , Patient's Chart, lab work & pertinent test results  Airway Mallampati: II  TM Distance: >3 FB Neck ROM: Full    Dental no notable dental hx. (+) Teeth Intact   Pulmonary Current Smoker and Patient abstained from smoking.,    Pulmonary exam normal breath sounds clear to auscultation       Cardiovascular Exercise Tolerance: Good hypertension, Pt. on medications Normal cardiovascular exam+ dysrhythmias Atrial Fibrillation  Rhythm:Regular Rate:Normal     Neuro/Psych PSYCHIATRIC DISORDERS negative neurological ROS     GI/Hepatic hiatal hernia, (+) Cirrhosis   Esophageal Varices    , Hepatitis -, C  Endo/Other  negative endocrine ROS  Renal/GU      Musculoskeletal   Abdominal   Peds  Hematology  (+) anemia , Hgb 12.6   Anesthesia Other Findings   Reproductive/Obstetrics                           Anesthesia Physical Anesthesia Plan  ASA: III  Anesthesia Plan: General   Post-op Pain Management:    Induction: Intravenous  PONV Risk Score and Plan: 3 and Midazolam, Treatment may vary due to age or medical condition, Ondansetron and Dexamethasone  Airway Management Planned: Oral ETT  Additional Equipment: Arterial line  Intra-op Plan:   Post-operative Plan: Extubation in OR  Informed Consent: I have reviewed the patients History and Physical, chart, labs and discussed the procedure including the risks, benefits and alternatives for the proposed anesthesia with the patient or authorized representative who has indicated his/her understanding and acceptance.     Dental advisory given  Plan Discussed with:   Anesthesia Plan Comments: (Follows with cardiology for hx of A. Fib/flutter not on oral anticoagulation secondary to GI bleed. Cleared for  surgery at [place] 07/31/19, per note "1.Patient is planning to undergo TIPS procedure.  Per Revised Cardiac Index, she is low risk for noncardiac procedure with a 0.9% estimated rate of adverse event.  She is able to achieve greater than 4 METs without cardiac limitation.  She is able to proceed for noncardiac procedure without any further cardiac work-up at low cardiac risk."  Follows with GI Dr. Marius Ditch for chronic hep C s/p treatment, decompensated alcoholic cirrhosis with refractory ascites, known esophageal varices, upper GI bleed secondary to Dieulafoy's. Currently requiring frequent large volume paracentesis, most recently 12/16 with 4.9L removed. Per Consult note by Dr. Vernard Gambles the pt's MELD score is 11.   Will need DOS labs and eval.  EKG 07/31/19: NSR. Rate 77. Cant rule out inferior infarct, age undetermined.  TTE 01/19/19:  1. The left ventricle has normal systolic function, with an ejection fraction of 55-60%. The cavity size was normal. Left ventricular diastolic function could not be evaluated. No evidence of left ventricular regional wall motion abnormalities.  2. The right ventricle has normal systolic function. The cavity was not assessed. There is no increase in right ventricular wall thickness. Right ventricular systolic pressure is mildly elevated with an estimated pressure of 35.0 mmHg.  3. Left atrial size was mildly dilated.  4. The mitral valve is degenerative. Mild thickening of the mitral valve leaflet. There is moderate mitral annular calcification present. No evidence of mitral valve stenosis.  5. The tricuspid valve is grossly normal. Tricuspid valve regurgitation is mild-moderate.  6. The aortic valve is tricuspid. Moderate thickening of  the aortic valve. Mild calcification of the aortic valve.  7. The aortic root is normal in size and structure.  8. The inferior vena cava was dilated in size with <50% respiratory variability.  9. Underlying rhythm appears to be atrial  flutter. )      Anesthesia Quick Evaluation

## 2019-08-21 NOTE — Progress Notes (Signed)
Anesthesia Chart Review: Same day workup  Follows with cardiology for hx of A. Fib/flutter not on oral anticoagulation secondary to GI bleed. Cleared for surgery at [place] 07/31/19, per note "1.Patient is planning to undergo TIPS procedure.  Per Revised Cardiac Index, she is low risk for noncardiac procedure with a 0.9% estimated rate of adverse event.  She is able to achieve greater than 4 METs without cardiac limitation.  She is able to proceed for noncardiac procedure without any further cardiac work-up at low cardiac risk."  Follows with GI Dr. Marius Ditch for chronic hep C s/p treatment, decompensated alcoholic cirrhosis with refractory ascites, known esophageal varices, upper GI bleed secondary to Dieulafoy's. Currently requiring frequent large volume paracentesis, most recently 12/16 with 4.9L removed. Per Consult note by Dr. Vernard Gambles the pt's MELD score is 11.   Will need DOS labs and eval.  EKG 07/31/19: NSR. Rate 77. Cant rule out inferior infarct, age undetermined.  TTE 01/19/19:  1. The left ventricle has normal systolic function, with an ejection fraction of 55-60%. The cavity size was normal. Left ventricular diastolic function could not be evaluated. No evidence of left ventricular regional wall motion abnormalities.  2. The right ventricle has normal systolic function. The cavity was not assessed. There is no increase in right ventricular wall thickness. Right ventricular systolic pressure is mildly elevated with an estimated pressure of 35.0 mmHg.  3. Left atrial size was mildly dilated.  4. The mitral valve is degenerative. Mild thickening of the mitral valve leaflet. There is moderate mitral annular calcification present. No evidence of mitral valve stenosis.  5. The tricuspid valve is grossly normal. Tricuspid valve regurgitation is mild-moderate.  6. The aortic valve is tricuspid. Moderate thickening of the aortic valve. Mild calcification of the aortic valve.  7. The aortic root is normal  in size and structure.  8. The inferior vena cava was dilated in size with <50% respiratory variability.  9. Underlying rhythm appears to be atrial flutter.   Wynonia Musty [facility]/Anesthesiology Phone [phone] 08/21/2019 2:02 PM

## 2019-08-22 ENCOUNTER — Other Ambulatory Visit: Payer: Self-pay | Admitting: Radiology

## 2019-08-22 ENCOUNTER — Other Ambulatory Visit: Payer: Self-pay

## 2019-08-22 ENCOUNTER — Other Ambulatory Visit
Admission: RE | Admit: 2019-08-22 | Discharge: 2019-08-22 | Disposition: A | Discharge status: Home / Self Care | Payer: Medicare Other | Source: Ambulatory Visit | Attending: Interventional Radiology | Admitting: Interventional Radiology

## 2019-08-22 DIAGNOSIS — Z20828 Contact with and (suspected) exposure to other viral communicable diseases: Secondary | ICD-10-CM | POA: Insufficient documentation

## 2019-08-22 DIAGNOSIS — Z01812 Encounter for preprocedural laboratory examination: Secondary | ICD-10-CM | POA: Diagnosis present

## 2019-08-22 LAB — SARS CORONAVIRUS 2 (TAT 6-24 HRS): SARS Coronavirus 2: NEGATIVE

## 2019-08-23 ENCOUNTER — Other Ambulatory Visit: Payer: Self-pay | Admitting: Radiology

## 2019-08-23 MED ORDER — CEFAZOLIN SODIUM-DEXTROSE 2-4 GM/100ML-% IV SOLN
2.0000 g | INTRAVENOUS | Status: AC
  Administered 2019-08-27: 2 g via INTRAVENOUS
  Filled 2019-08-23 (×2): qty 100

## 2019-08-27 ENCOUNTER — Observation Stay (HOSPITAL_COMMUNITY)
Admission: RE | Admit: 2019-08-27 | Discharge: 2019-08-28 | Disposition: A | Discharge status: Home / Self Care | Payer: Medicare Other | Attending: Interventional Radiology | Admitting: Interventional Radiology

## 2019-08-27 ENCOUNTER — Encounter (HOSPITAL_COMMUNITY): Payer: Self-pay | Admitting: Interventional Radiology

## 2019-08-27 ENCOUNTER — Inpatient Hospital Stay (HOSPITAL_COMMUNITY): Payer: Medicare Other

## 2019-08-27 ENCOUNTER — Inpatient Hospital Stay (HOSPITAL_COMMUNITY): Payer: Medicare Other | Admitting: Physician Assistant

## 2019-08-27 ENCOUNTER — Other Ambulatory Visit: Payer: Self-pay

## 2019-08-27 ENCOUNTER — Encounter (HOSPITAL_COMMUNITY)
Admission: RE | Disposition: A | Discharge status: Home / Self Care | Payer: Self-pay | Source: Home / Self Care | Attending: Interventional Radiology

## 2019-08-27 DIAGNOSIS — I10 Essential (primary) hypertension: Secondary | ICD-10-CM | POA: Diagnosis not present

## 2019-08-27 DIAGNOSIS — R188 Other ascites: Secondary | ICD-10-CM | POA: Diagnosis not present

## 2019-08-27 DIAGNOSIS — Z79899 Other long term (current) drug therapy: Secondary | ICD-10-CM | POA: Diagnosis not present

## 2019-08-27 DIAGNOSIS — K5790 Diverticulosis of intestine, part unspecified, without perforation or abscess without bleeding: Secondary | ICD-10-CM | POA: Diagnosis not present

## 2019-08-27 DIAGNOSIS — K7031 Alcoholic cirrhosis of liver with ascites: Secondary | ICD-10-CM | POA: Diagnosis not present

## 2019-08-27 DIAGNOSIS — F17210 Nicotine dependence, cigarettes, uncomplicated: Secondary | ICD-10-CM | POA: Diagnosis not present

## 2019-08-27 DIAGNOSIS — I4891 Unspecified atrial fibrillation: Secondary | ICD-10-CM | POA: Insufficient documentation

## 2019-08-27 DIAGNOSIS — K766 Portal hypertension: Secondary | ICD-10-CM | POA: Insufficient documentation

## 2019-08-27 HISTORY — DX: Personal history of other diseases of the digestive system: Z87.19

## 2019-08-27 HISTORY — PX: IR PARACENTESIS: IMG2679

## 2019-08-27 HISTORY — DX: Inflammatory liver disease, unspecified: K75.9

## 2019-08-27 HISTORY — PX: IR TIPS: IMG2295

## 2019-08-27 HISTORY — DX: Pneumonia, unspecified organism: J18.9

## 2019-08-27 HISTORY — PX: RADIOLOGY WITH ANESTHESIA: SHX6223

## 2019-08-27 LAB — COMPREHENSIVE METABOLIC PANEL
ALT: 27 U/L (ref 0–44)
AST: 75 U/L — ABNORMAL HIGH (ref 15–41)
Albumin: 3.1 g/dL — ABNORMAL LOW (ref 3.5–5.0)
Alkaline Phosphatase: 514 U/L — ABNORMAL HIGH (ref 38–126)
Anion gap: 11 (ref 5–15)
BUN: 5 mg/dL — ABNORMAL LOW (ref 6–20)
CO2: 26 mmol/L (ref 22–32)
Calcium: 9 mg/dL (ref 8.9–10.3)
Chloride: 100 mmol/L (ref 98–111)
Creatinine, Ser: 0.58 mg/dL (ref 0.44–1.00)
GFR calc Af Amer: >60 mL/min (ref >60)
GFR calc non Af Amer: >60 mL/min (ref >60)
Glucose, Bld: 117 mg/dL — ABNORMAL HIGH (ref 70–99)
Potassium: 4.3 mmol/L (ref 3.5–5.1)
Sodium: 137 mmol/L (ref 135–145)
Total Bilirubin: 1.8 mg/dL — ABNORMAL HIGH (ref 0.3–1.2)
Total Protein: 6 g/dL — ABNORMAL LOW (ref 6.5–8.1)

## 2019-08-27 LAB — CBC
HCT: 42.5 % (ref 36.0–46.0)
Hemoglobin: 13.7 g/dL (ref 12.0–15.0)
MCH: 27.4 pg (ref 26.0–34.0)
MCHC: 32.2 g/dL (ref 30.0–36.0)
MCV: 85 fL (ref 80.0–100.0)
Platelets: 271 10*3/uL (ref 150–400)
RBC: 5 MIL/uL (ref 3.87–5.11)
RDW: 23.5 % — ABNORMAL HIGH (ref 11.5–15.5)
WBC: 14.9 10*3/uL — ABNORMAL HIGH (ref 4.0–10.5)
nRBC: 0 % (ref 0.0–0.2)

## 2019-08-27 LAB — ABO/RH: ABO/RH(D): O POS

## 2019-08-27 LAB — PROTIME-INR
INR: 1.2 (ref 0.8–1.2)
Prothrombin Time: 15 seconds (ref 11.4–15.2)

## 2019-08-27 LAB — TYPE AND SCREEN
ABO/RH(D): O POS
Antibody Screen: NEGATIVE

## 2019-08-27 IMAGING — XA IR TIPS
11 of 14 series · 13 of 24 positions shown · IV contrast (IODINE)
Comparison: none

CLINICAL DATA: Cirrhosis, portal venous hypertension, recurrent
large volume abdominal ascites requiring multiple percutaneous
paracentesis procedures. History of atrial fibrillation.

[Series 1: ir tips · 1 of 6 slices shown]
[im 1/6]
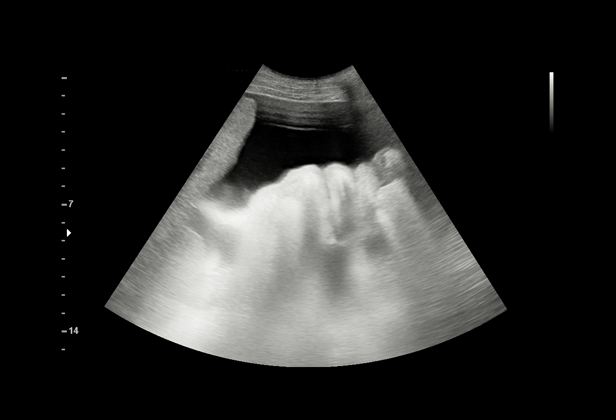

[Series 2: body 4 care · 1 of 8 slices shown (1 of 10)]
[im 8/8]
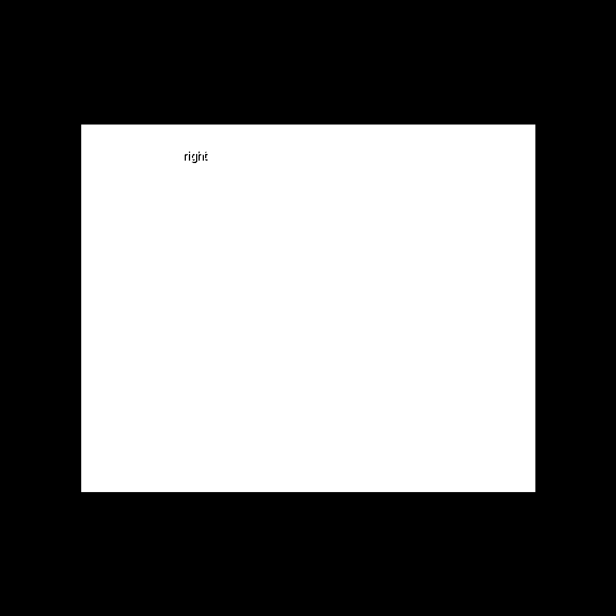

[Series 4: body 4 care · 15 acquisitions, 2 frames shown (2 of 10)]
[im 1/15]
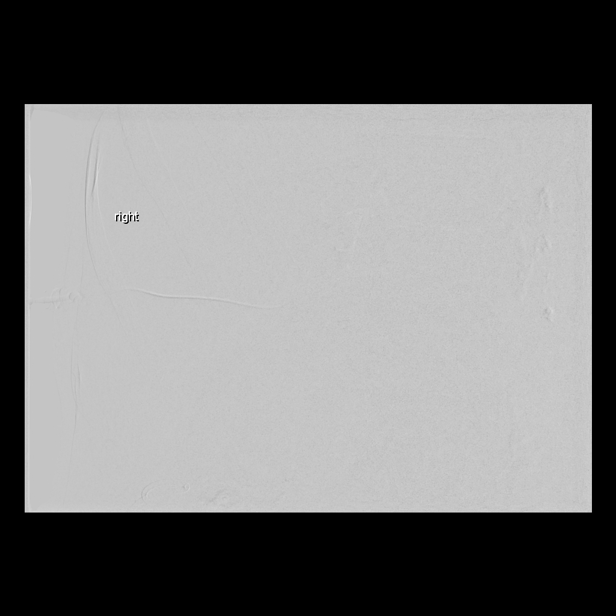
[im 10/15]
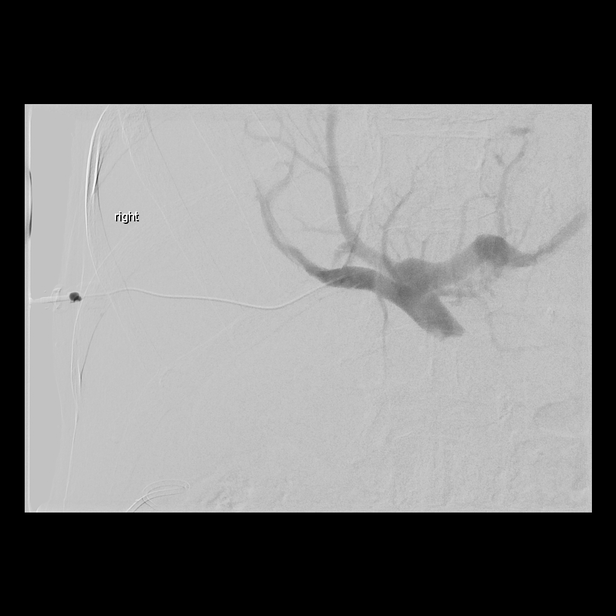

[Series 9: body 4 care · 14 acquisitions, 1 frame shown (3 of 10)]
[im 1/14]
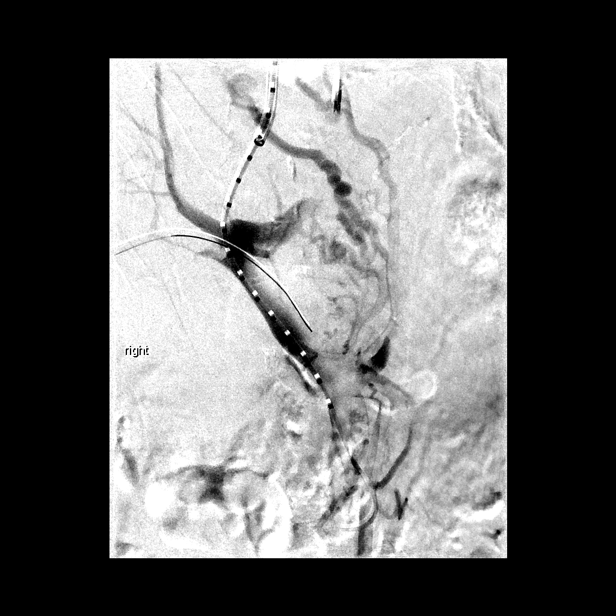

[Series 10: body 4 care · 8 acquisitions, 1 frame shown (4 of 10)]
[im 1/8]
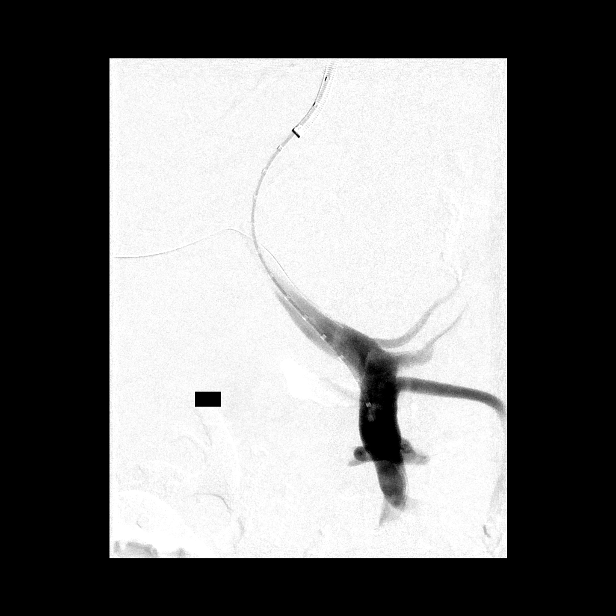

[Series 11: body 4 care · 6 acquisitions, 2 frames shown (5 of 10)]
[im 1/6]
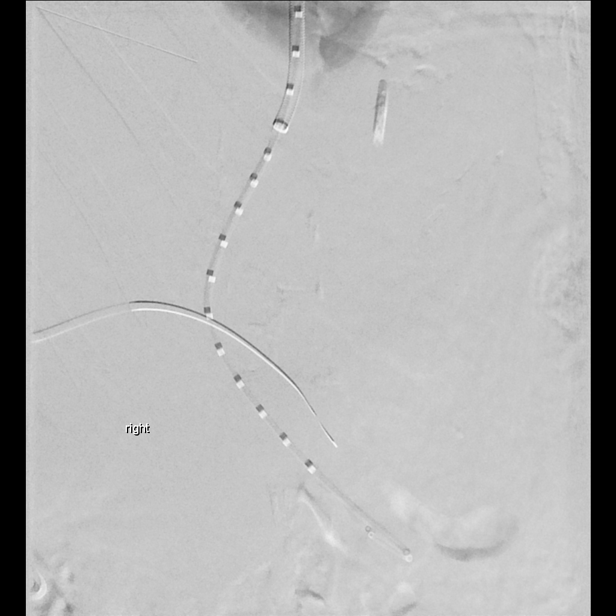
[im 1/6]
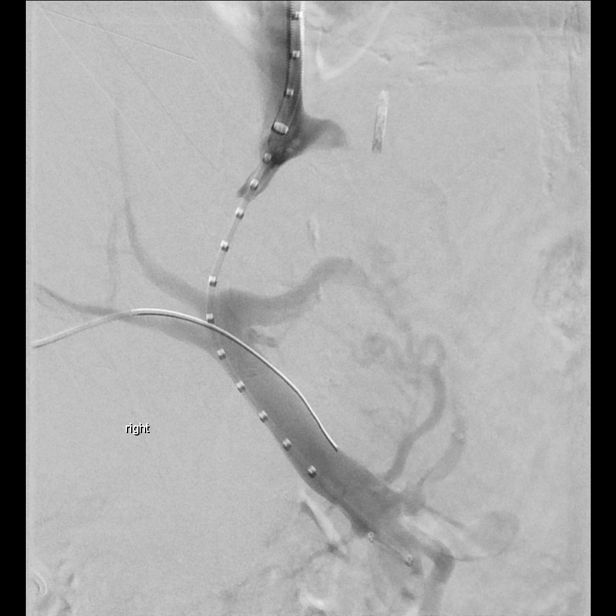

[Series 13: body 4 care · 5 acquisitions, 1 frame shown (6 of 10)]
[im 1/5]
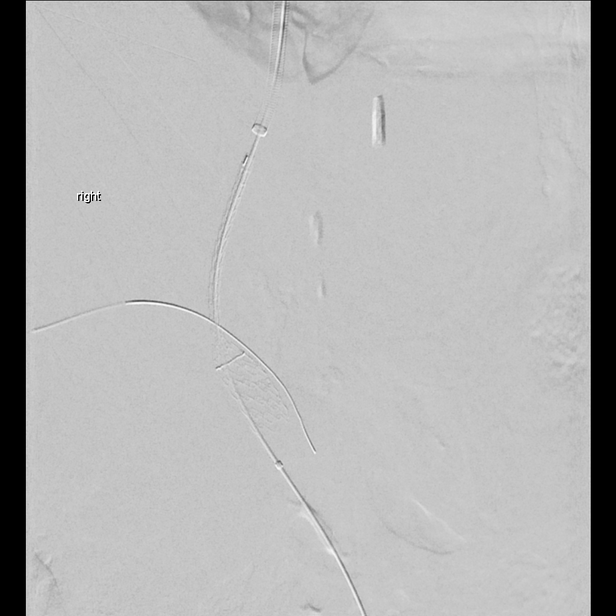

[Series 14: body 4 care · 7 acquisitions, 1 frame shown (7 of 10)]
[im 1/7]
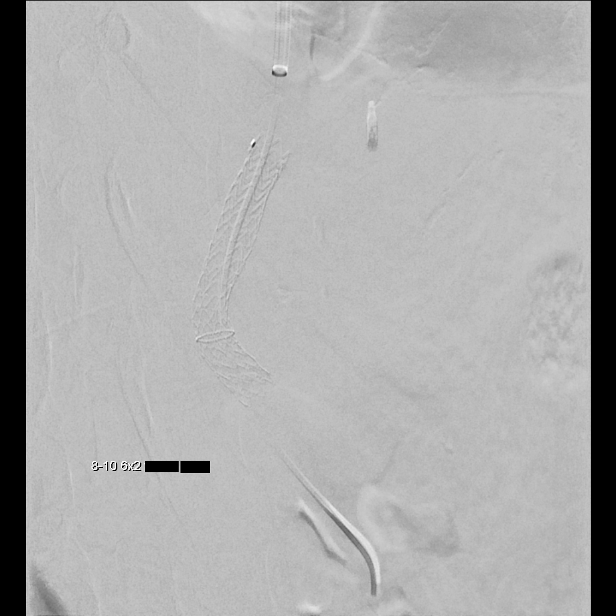

[Series 15: body 4 care · 9 acquisitions, 1 frame shown (8 of 10)]
[im 1/9]
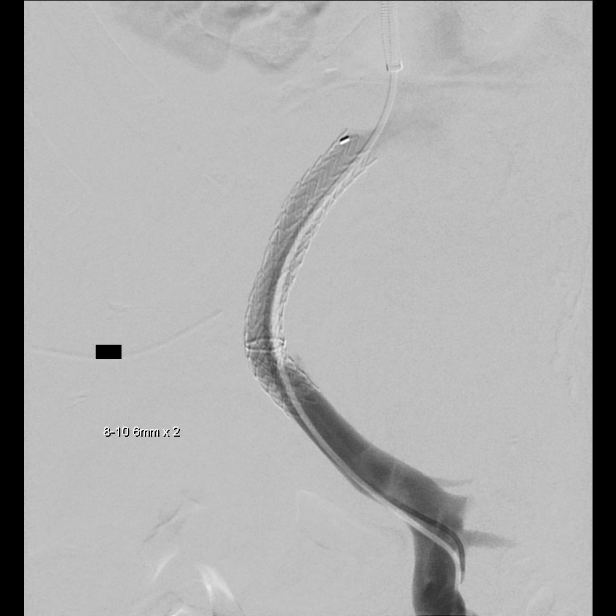

[Series 16: body 4 care · 6 acquisitions, 1 frame shown (9 of 10)]
[im 1/6]
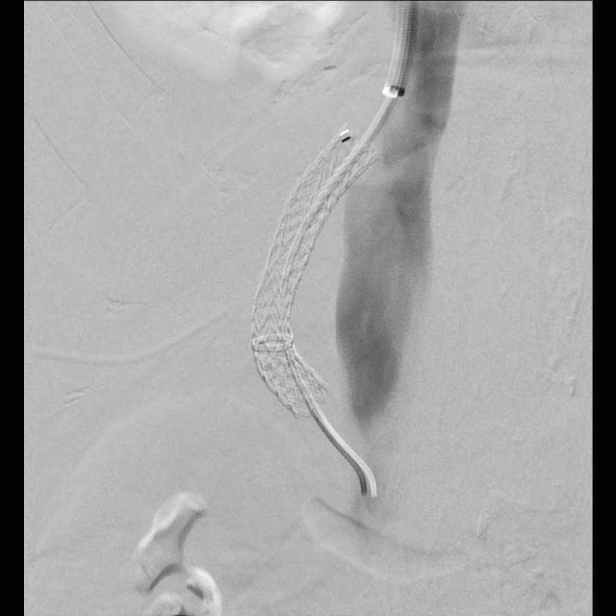

[Series 17: body 4 care · 4 acquisitions, 1 frame shown (10 of 10)]
[im 1/4]
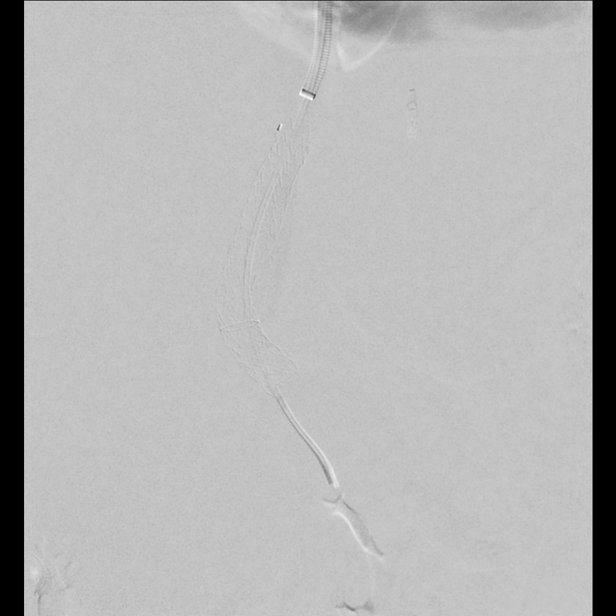

[13 of 24 positions shown; findings below may reference images not displayed]

EXAM:
1. TIPS CREATION
2. ULTRASOUND-GUIDED VENOUS ACCESS
3. PARACENTESIS WITH ULTRASOUND GUIDANCE

ANESTHESIA/SEDATION:
General - as administered by the Anesthesia department

MEDICATIONS:
Cefazolin 2 g IV within 1 hour of procedure start

FLUOROSCOPY TIME:  17.4 minutes; [9Y]  [9Y] DAP

COMPLICATIONS:
None immediate.

PROCEDURE:
Informed written consent was obtained from the patient after a
thorough discussion of the procedural risks, benefits and
alternatives. All questions were addressed. See previous
consultation. Maximal Sterile Barrier
Technique was utilized including caps, mask, sterile gowns, sterile
gloves, sterile drape, hand hygiene and skin antiseptic. A timeout
was performed prior to the initiation of the procedure.

The skin overlying the right upper abdominal quadrant as well as the
right neck  region were prepped and draped in usual
sterile fashion. Initial ultrasound scanning demonstrates a moderate
amount of recurrent perihepatic ascites.

Under ultrasound guidance, a 6 French Safe-T-Centesis needle was
advanced into the peritoneal cavity from a right lateral approach
for paracentesis removing 4.8 L clear yellow ascites.

Under direct ultrasound guidance, a peripheral aspect of the right
portal
vein was accessed with a 21 gauge micropuncture needle. Ultrasound
image was saved
for procedural documentation purposes. Guidewire advanced into the
main portal vein. Micropuncture 3 French dilator was advanced over
the guidewire, with the radiopaque transition
positioned at the target entrance to the  right portal
vein.

Next, the right internal jugular vein was accessed under direct
ultrasound with micropuncture set. Ultrasound image was saved for
procedural documentation
purposes. This allowed for placement of the 10 French TIPS vascular
sheath over a Bentson wire.

With the aid of angiographic guidewires, an MPA catheter was
utilized to select the right hepatic vein and a hepatic venogram was
performed.  Sheath advanced into the right hepatic vein.

Under fluoroscopic guidance, the right portal vein with targeted
with THOLENTZ TIPS needle directed at the radiopaque target
within the right portal vein. Ultimately, the right portal vein was
accessed at a desirable location allowing advancement of a stiff
Glidewire into the main portal vein. A 4 French glide catheter was
advanced over the stiff Glidewire.
Next, catheter exchanged over guidewire a portal venogram was
performed with a measuring Omni
Flush catheter.

Tract dilated with the 5 mm mustang balloon over the wire to
facilitate
advancement of the sheath into the main portal vein.

Next, a 2 cm (un covered) x 6 cm (covered) x 10 mm diameter THOLENTZ
VIATORR
TIPS Endoprosthesis was advanced through the sheath across the intra
hepatic track and
deployed. The TIPS stent was angioplastied in multiple stations to
10 mm diameter.

Follow-up portal venogram demonstrated good stent deployment and
flow, no residual
Stenosis.

Pressure measurements were obtained: Right atrium 21/15 (18) mmHg,
portal vein monophasic mean 30 mmHg, portosystemic gradient 12 mmHg.
All wires, catheters and sheaths were removed from the patient.
Hemostasis was achieved at the right IJ access site with
manual compression. A dressing was placed.

The patient tolerated the procedure well. Patient transferred to the
PACU.
IMPRESSION: 1. Technically successful TIPS creation with final mean
portosystemic gradient 12 mmHg.
2. Ultrasound guided paracentesis removing 4.8 L clear yellow
ascites.

## 2019-08-27 SURGERY — IR WITH ANESTHESIA
Anesthesia: General

## 2019-08-27 MED ORDER — FENTANYL CITRATE (PF) 100 MCG/2ML IJ SOLN
INTRAMUSCULAR | Status: AC
  Filled 2019-08-27: qty 2

## 2019-08-27 MED ORDER — ONDANSETRON HCL 4 MG/2ML IJ SOLN
4.0000 mg | Freq: Once | INTRAMUSCULAR | Status: AC | PRN
  Administered 2019-08-27: 4 mg via INTRAVENOUS

## 2019-08-27 MED ORDER — OXYCODONE HCL 5 MG/5ML PO SOLN: 5.0000 mg | Freq: Once | ORAL | Status: DC | PRN

## 2019-08-27 MED ORDER — BISACODYL 5 MG PO TBEC: 5.0000 mg | DELAYED_RELEASE_TABLET | Freq: Every day | ORAL | Status: DC | PRN

## 2019-08-27 MED ORDER — ADULT MULTIVITAMIN W/MINERALS CH: 1.0000 | ORAL_TABLET | Freq: Every day | ORAL | Status: DC

## 2019-08-27 MED ORDER — SENNOSIDES-DOCUSATE SODIUM 8.6-50 MG PO TABS: 1.0000 | ORAL_TABLET | Freq: Every evening | ORAL | Status: DC | PRN

## 2019-08-27 MED ORDER — LACTATED RINGERS IV SOLN: INTRAVENOUS | Status: DC | PRN

## 2019-08-27 MED ORDER — PROPOFOL 10 MG/ML IV BOLUS
INTRAVENOUS | Status: DC | PRN
  Administered 2019-08-27: 100 mg via INTRAVENOUS

## 2019-08-27 MED ORDER — IOHEXOL 300 MG/ML  SOLN
150.0000 mL | Freq: Once | INTRAMUSCULAR | Status: AC | PRN
  Administered 2019-08-27: 100 mL via INTRAVENOUS

## 2019-08-27 MED ORDER — ACETAMINOPHEN 650 MG RE SUPP: 650.0000 mg | Freq: Four times a day (QID) | RECTAL | Status: DC | PRN

## 2019-08-27 MED ORDER — OXYCODONE HCL 5 MG PO TABS: 5.0000 mg | ORAL_TABLET | Freq: Once | ORAL | Status: DC | PRN

## 2019-08-27 MED ORDER — ZOLPIDEM TARTRATE 5 MG PO TABS: 5.0000 mg | ORAL_TABLET | Freq: Every evening | ORAL | Status: DC | PRN

## 2019-08-27 MED ORDER — SODIUM CHLORIDE 0.9 % IV SOLN: INTRAVENOUS | Status: DC

## 2019-08-27 MED ORDER — HYDROCODONE-ACETAMINOPHEN 5-325 MG PO TABS
1.0000 | ORAL_TABLET | ORAL | Status: DC | PRN
  Administered 2019-08-27 – 2019-08-28 (×2): 1 via ORAL
  Filled 2019-08-27 (×2): qty 1

## 2019-08-27 MED ORDER — CEFAZOLIN SODIUM-DEXTROSE 2-4 GM/100ML-% IV SOLN: 2.0000 g | INTRAVENOUS | Status: DC

## 2019-08-27 MED ORDER — ACETAMINOPHEN 325 MG PO TABS: 650.0000 mg | ORAL_TABLET | Freq: Four times a day (QID) | ORAL | Status: DC | PRN

## 2019-08-27 MED ORDER — LIDOCAINE 2% (20 MG/ML) 5 ML SYRINGE
INTRAMUSCULAR | Status: DC | PRN
  Administered 2019-08-27: 70 mg via INTRAVENOUS

## 2019-08-27 MED ORDER — MAGNESIUM CITRATE PO SOLN: 1.0000 | Freq: Once | ORAL | Status: DC | PRN

## 2019-08-27 MED ORDER — FENTANYL CITRATE (PF) 250 MCG/5ML IJ SOLN
INTRAMUSCULAR | Status: DC | PRN
  Administered 2019-08-27: 25 ug via INTRAVENOUS
  Administered 2019-08-27: 50 ug via INTRAVENOUS
  Administered 2019-08-27: 25 ug via INTRAVENOUS

## 2019-08-27 MED ORDER — KETOROLAC TROMETHAMINE 30 MG/ML IJ SOLN
30.0000 mg | Freq: Four times a day (QID) | INTRAMUSCULAR | Status: DC | PRN
  Administered 2019-08-27 (×2): 30 mg via INTRAVENOUS
  Filled 2019-08-27 (×2): qty 1

## 2019-08-27 MED ORDER — FENTANYL CITRATE (PF) 100 MCG/2ML IJ SOLN: 25.0000 ug | INTRAMUSCULAR | Status: DC | PRN

## 2019-08-27 MED ORDER — ROCURONIUM BROMIDE 10 MG/ML (PF) SYRINGE
PREFILLED_SYRINGE | INTRAVENOUS | Status: DC | PRN
  Administered 2019-08-27: 50 mg via INTRAVENOUS
  Administered 2019-08-27: 30 mg via INTRAVENOUS
  Administered 2019-08-27: 50 mg via INTRAVENOUS

## 2019-08-27 MED ORDER — ONDANSETRON HCL 4 MG/2ML IJ SOLN: 4.0000 mg | Freq: Four times a day (QID) | INTRAMUSCULAR | Status: DC | PRN

## 2019-08-27 MED ORDER — MIDAZOLAM HCL 5 MG/5ML IJ SOLN
INTRAMUSCULAR | Status: DC | PRN
  Administered 2019-08-27 (×2): 1 mg via INTRAVENOUS

## 2019-08-27 MED ORDER — PHENYLEPHRINE 40 MCG/ML (10ML) SYRINGE FOR IV PUSH (FOR BLOOD PRESSURE SUPPORT)
PREFILLED_SYRINGE | INTRAVENOUS | Status: DC | PRN
  Administered 2019-08-27: 120 ug via INTRAVENOUS

## 2019-08-27 MED ORDER — FENTANYL CITRATE (PF) 100 MCG/2ML IJ SOLN
25.0000 ug | INTRAMUSCULAR | Status: DC | PRN
  Administered 2019-08-27: 50 ug via INTRAVENOUS

## 2019-08-27 MED ORDER — PHENYLEPHRINE HCL-NACL 10-0.9 MG/250ML-% IV SOLN
INTRAVENOUS | Status: DC | PRN
  Administered 2019-08-27: 30 ug/min via INTRAVENOUS

## 2019-08-27 MED ORDER — ONDANSETRON HCL 4 MG PO TABS: 4.0000 mg | ORAL_TABLET | Freq: Four times a day (QID) | ORAL | Status: DC | PRN

## 2019-08-27 MED ORDER — SUGAMMADEX SODIUM 200 MG/2ML IV SOLN
INTRAVENOUS | Status: DC | PRN
  Administered 2019-08-27 (×2): 200 mg via INTRAVENOUS

## 2019-08-27 NOTE — Progress Notes (Signed)
Received pt from PACU, A&O x4. Dressings to right neck and RUQ dry and intact. Abd is distended,soft.

## 2019-08-27 NOTE — Progress Notes (Signed)
Patient seen at beside post TIP procedure. Patient alert and sitting in bed eating. Denies any fevers, headache, chest pain, SOB, cough, abdominal pain, nausea, vomiting or bleeding. Patient states that she "feel(s) much better after the procedure." patient will continued to be monitored overnight for encephalothomy, bleeding and additional complications.  Access site (RIJ and RUQ paracentesis site) unremarkable with minimal tenderness noted with palpation and no pseudoaneurysm.  Request ammonia be drawn in the am.   Please page on call IR Rad with any questions or concerns.

## 2019-08-27 NOTE — Procedures (Signed)
  Procedure: TIPS, Paracentesis   EBL:   minimal Complications:  none immediate  See full dictation in [facility] PACS.  D. Jayshawn Colston MD Main # [phone] Pager  [phone]    

## 2019-08-27 NOTE — Sedation Documentation (Signed)
Pt under the care of Anesthesia 

## 2019-08-27 NOTE — Anesthesia Postprocedure Evaluation (Signed)
Anesthesia Post Note  Patient: Anita Reed  Procedure(s) Performed: IR WITH ANESTHESIA TIPS PROCEDURE (N/A )     Patient location during evaluation: PACU Anesthesia Type: General Level of consciousness: awake and alert Pain management: pain level controlled Vital Signs Assessment: post-procedure vital signs reviewed and stable Respiratory status: spontaneous breathing, nonlabored ventilation, respiratory function stable and patient connected to nasal cannula oxygen Cardiovascular status: blood pressure returned to baseline and stable Postop Assessment: no apparent nausea or vomiting Anesthetic complications: no    Last Vitals:  Vitals:   08/27/19 1330 08/27/19 1354  BP: 137/75 (!) 149/82  Pulse: (!) 104 89  Resp: 19 17  Temp:  36.8 C  SpO2: 91% 94%    Last Pain:  Vitals:   08/27/19 1354  TempSrc: Oral  PainSc:                  Barnet Glasgow

## 2019-08-27 NOTE — Transfer of Care (Signed)
Immediate Anesthesia Transfer of Care Note  Patient: Anita Reed  Procedure(s) Performed: IR WITH ANESTHESIA TIPS PROCEDURE (N/A )  Patient Location: PACU  Anesthesia Type:General  Level of Consciousness: awake  Airway & Oxygen Therapy: Patient Spontanous Breathing and Patient connected to face mask oxygen  Post-op Assessment: Report given to RN and Post -op Vital signs reviewed and stable  Post vital signs: Reviewed and stable  Last Vitals:  Vitals Value Taken Time  BP 128/91 08/27/19 1200  Temp 36.6 C 08/27/19 1200  Pulse 98 08/27/19 1204  Resp 22 08/27/19 1204  SpO2 95 % 08/27/19 1204  Vitals shown include unvalidated device data.  Last Pain:  Vitals:   08/27/19 0754  TempSrc:   PainSc: 0-No pain      Patients Stated Pain Goal: 3 ([id number])  Complications: No apparent anesthesia complications

## 2019-08-27 NOTE — Plan of Care (Signed)
  Problem: Education: Goal: Knowledge of General Education information will improve Description: Including pain rating scale, medication(s)/side effects and non-pharmacologic comfort measures Outcome: Progressing   Problem: Safety: Goal: Ability to remain free from injury will improve Outcome: Progressing   

## 2019-08-27 NOTE — Progress Notes (Signed)
Portal vein : Mean= 30 R atrium: 21/15 (18)

## 2019-08-27 NOTE — Anesthesia Procedure Notes (Signed)
Procedure Name: Intubation Performed by: Austynn Pridmore H, CRNA Pre-anesthesia Checklist: Patient identified, Emergency Drugs available, Suction available and Patient being monitored Patient Re-evaluated:Patient Re-evaluated prior to induction Oxygen Delivery Method: Circle System Utilized Preoxygenation: Pre-oxygenation with 100% oxygen Induction Type: IV induction Ventilation: Mask ventilation without difficulty Laryngoscope Size: Mac and 3 Grade View: Grade I Tube type: Oral Tube size: 7.0 mm Number of attempts: 1 Airway Equipment and Method: Stylet Placement Confirmation: ETT inserted through vocal cords under direct vision,  positive ETCO2 and breath sounds checked- equal and bilateral Secured at: 21 cm Tube secured with: Tape Dental Injury: Teeth and Oropharynx as per pre-operative assessment        

## 2019-08-27 NOTE — Anesthesia Procedure Notes (Signed)
Arterial Line Insertion Start/End12/28/2020 9:50 AM, 08/27/2019 10:05 AM Performed by: Barnet Glasgow, MD, Milford Cage, CRNA, anesthesiologist  Patient location: OOR procedure area. Preanesthetic checklist: patient identified, IV checked, site marked, risks and benefits discussed, surgical consent, monitors and equipment checked, pre-op evaluation, timeout performed and anesthesia consent Lidocaine 1% used for infiltration Right, radial was placed Catheter size: 20 G Hand hygiene performed  and maximum sterile barriers used   Attempts: 2 Procedure performed without using ultrasound guided technique. Following insertion, dressing applied. Post procedure assessment: normal and unchanged

## 2019-08-27 NOTE — H&P (Signed)
Chief Complaint: Recurrent Ascites  Referring Physician(s): Dr Lucianne Lei  Supervising Physician: [name]  Patient Status: [name] - Out-pt  History of Present Illness: Anita Reed is a 58 y.o. female History of decomepnsated alcoholic cirrhosis with recurrent ascites, esophageal varices, a fib not on anticoagulation due to GI Bleed in October 2020 [facility] T[facility].  Na+ MELD score 13 (INR from 11.14.20) Child Pug 8 points Class B.  Past Medical History:  Diagnosis Date  . Anemia   . Atrial flutter ([place])   . Cancer [facility])    lymphoma 11 yrs. ago  . Cirrhosis ([place])   . Esophageal varices ([place])   . Hepatitis    hep C-resolved  . History of hiatal hernia   . Hypertension   . Pneumonia   . Portal hypertension ([facility])     Past Surgical History:  Procedure Laterality Date  . ABDOMINAL HYSTERECTOMY    . APPENDECTOMY    . COLONOSCOPY N/A 06/03/2019   Procedure: COLONOSCOPY;  Surgeon: Toledo, Benay Pike, MD;  Location: [facility] ENDOSCOPY;  Service: Gastroenterology;  Laterality: N/A;  . ESOPHAGOGASTRODUODENOSCOPY N/A 01/29/2019   Procedure: ESOPHAGOGASTRODUODENOSCOPY (EGD);  Surgeon: [name], MD;  Location: [facility] ENDOSCOPY;  Service: Gastroenterology;  Laterality: N/A;  . ESOPHAGOGASTRODUODENOSCOPY N/A 06/01/2019   Procedure: ESOPHAGOGASTRODUODENOSCOPY (EGD);  Surgeon: Toledo, Benay Pike, MD;  Location: [facility] ENDOSCOPY;  Service: Gastroenterology;  Laterality: N/A;  . IR RADIOLOGIST EVAL & MGMT  08/08/2019  . TONSILLECTOMY      Allergies: Patient has no known allergies.  Medications: Prior to Admission medications   Medication Sig Start Date End Date Taking? Authorizing Provider  digoxin (LANOXIN) 0.125 MG tablet Take 1 tablet (0.125 mg total) by mouth daily. 07/30/19  Yes Hubbard Hartshorn, FNP  FLUoxetine (PROZAC) 10 MG capsule Take 1 capsule (10 mg total) by mouth daily. 07/27/19  Yes Hubbard Hartshorn, FNP  furosemide (LASIX) 40 MG tablet Take  0.5 tablets (20 mg total) by mouth daily. 06/28/19  Yes Max Sane, MD  gabapentin (NEURONTIN) 400 MG capsule Take 1 capsule (400 mg total) by mouth 3 (three) times daily. 08/13/19  Yes Hubbard Hartshorn, FNP  midodrine (PROAMATINE) 5 MG tablet Take 1 tablet (5 mg total) by mouth 3 (three) times daily with meals. 07/27/19  Yes Hubbard Hartshorn, FNP  thiamine 100 MG tablet Take 100 mg by mouth daily. Vitamin B-1   Yes [provider]  tiZANidine (ZANAFLEX) 4 MG capsule Take 1 capsule (4 mg total) by mouth 3 (three) times daily as needed for muscle spasms. 08/13/19  Yes Hubbard Hartshorn, FNP     Family History  Problem Relation Age of Onset  . Cancer Mother   . Cancer Father     Social History   Socioeconomic History  . Marital status: Married    Spouse name: Anita Reed  . Number of children: 1  . Years of education: 44  . Highest education level: 9th grade  Occupational History  . Occupation: disabled  Tobacco Use  . Smoking status: Current Every Day Smoker    Packs/day: 1.00    Years: 35.00    Pack years: 35.00    Types: Cigarettes  . Smokeless tobacco: Never Used  Substance and Sexual Activity  . Alcohol use: Not Currently  . Drug use: Never  . Sexual activity: Not Currently  Other Topics Concern  . Not on file  Social History Narrative  . Not on file   Social Determinants  of Health   Financial Resource Strain: Medium Risk  . Difficulty of Paying Living Expenses: Somewhat hard  Food Insecurity: Food Insecurity Present  . Worried About Charity fundraiser in the Last Year: Sometimes true  . Ran Out of Food in the Last Year: Sometimes true  Transportation Needs: No Transportation Needs  . Lack of Transportation (Medical): No  . Lack of Transportation (Non-Medical): No  Physical Activity: Inactive  . Days of Exercise per Week: 0 days  . Minutes of Exercise per Session: 0 min  Stress: No Stress Concern Present  . Feeling of Stress : Only a little  Social Connections:  Moderately Isolated  . Frequency of Communication with Friends and Family: Once a week  . Frequency of Social Gatherings with Friends and Family: Never  . Attends Religious Services: Never  . Active Member of Clubs or Organizations: No  . Attends Archivist Meetings: Never  . Marital Status: Married    Review of Systems: A 12 point ROS discussed and pertinent positives are indicated in the HPI above.  All other systems are negative.  Review of Systems  Constitutional: Negative for fever.  HENT: Negative for congestion.   Respiratory: Positive for shortness of breath. Negative for cough.   Cardiovascular: Negative for chest pain.  Gastrointestinal: Positive for abdominal distention and abdominal pain. Negative for diarrhea, nausea and vomiting.  Psychiatric/Behavioral: Negative for agitation, behavioral problems and confusion.    Vital Signs: BP (!) 117/59   Pulse 70   Temp 98.3 F (36.8 C) (Oral)   Resp 20   Ht 5\' 9"  (1.753 m)   Wt 145 lb (65.8 kg)   SpO2 98%   BMI 21.41 kg/m   Physical Exam Vitals and nursing note reviewed.  Constitutional:      Appearance: She is well-developed.  HENT:     Head: Normocephalic and atraumatic.  Eyes:     Conjunctiva/sclera: Conjunctivae normal.  Cardiovascular:     Rate and Rhythm: Normal rate and regular rhythm.     Comments: A fib Pulmonary:     Effort: Pulmonary effort is normal.     Breath sounds: Normal breath sounds.  Abdominal:     General: There is distension.     Palpations: Abdomen is soft.  Musculoskeletal:        General: Normal range of motion.     Cervical back: Normal range of motion.  Skin:    General: Skin is warm.  Neurological:     Mental Status: She is alert and oriented to person, place, and time.  Psychiatric:        Mood and Affect: Mood normal.        Behavior: Behavior normal.        Thought Content: Thought content normal.        Judgment: Judgment normal.     Imaging: US  Paracentesis  Result Date: 08/15/2019 INDICATION: Ascites due to alcoholic cirrhosis. EXAM: ULTRASOUND GUIDED PARACENTESIS MEDICATIONS: None. COMPLICATIONS: None immediate. PROCEDURE: Informed written consent was obtained from the patient after a discussion of the risks, benefits and alternatives to treatment. A timeout was performed prior to the initiation of the procedure. Initial ultrasound scanning demonstrates a large amount of ascites within the right lower abdominal quadrant. The right lower abdomen was prepped and draped in the usual sterile fashion. 1% lidocaine was used for local anesthesia. Initially, 6 French Safe-T-Centesis catheters were placed in the right lower quadrant but there was minimal drainage and likely related  to adjacent bowel. Therefore, a 19 gauge, 7-cm, Yueh catheter was introduced. An ultrasound image was saved for documentation purposes. The paracentesis was performed. The catheter was removed and a dressing was applied. The patient tolerated the procedure well without immediate post procedural complication. FINDINGS: A total of approximately 4.9 L of yellow fluid was removed. IMPRESSION: Successful ultrasound-guided paracentesis yielding 4.9 L liters of peritoneal fluid. Electronically Signed   By: Markus Daft M.D.   On: 08/15/2019 16:54   US Paracentesis  Result Date: 08/02/2019 INDICATION: Cirrhosis. EXAM: ULTRASOUND GUIDED  PARACENTESIS MEDICATIONS: None. COMPLICATIONS: None immediate. PROCEDURE: Informed written consent was obtained from the patient after a discussion of the risks, benefits and alternatives to treatment. A timeout was performed prior to the initiation of the procedure. Initial ultrasound scanning demonstrates a large amount of ascites within the right lower abdominal quadrant. The right lower abdomen was prepped and draped in the usual sterile fashion. 1% lidocaine was used for local anesthesia. Following this, a 6 French catheter was introduced. An  ultrasound image was saved for documentation purposes. The paracentesis was performed. The catheter was removed and a dressing was applied. The patient tolerated the procedure well without immediate post procedural complication. Patient received post-procedure intravenous albumin; see nursing notes for details. FINDINGS: A total of approximately 3.8 L of yellow fluid fluid was removed. IMPRESSION: Successful ultrasound-guided paracentesis yielding 3.8 liters of peritoneal fluid. Electronically Signed   By: Marcello Moores  Register   On: 08/02/2019 15:05   IR Radiologist Eval & Mgmt  Result Date: 08/08/2019 Please refer to notes tab for details about interventional procedure. (Op Note)   Labs:  CBC: Recent Labs    07/15/19 0433 07/15/19 1542 07/16/19 0629 07/17/19 0820 07/30/19 0000  WBC 18.8*  --  19.3* 11.7* 11.3*  HGB 9.1* 13.2 12.0 13.5 12.6  HCT 28.1* 41.6 38.7 42.6 38.1  PLT 187  --  185 175 225    COAGS: Recent Labs    06/01/19 1251 06/22/19 1600 06/23/19 0522 07/14/19 1754  INR 1.4* 1.2 1.3* 1.2  APTT 29 30  --  30    BMP: Recent Labs    07/15/19 0433 07/16/19 0629 07/17/19 0820 07/30/19 0000  NA 136 139 136 137  K 3.1* 4.2 4.0 3.5  CL 103 105 105 103  CO2 25 27 22 20   GLUCOSE 98 96 86 132*  BUN 8 <5* 7 9  CALCIUM 7.7* 8.6* 8.7* 8.6  CREATININE 0.54 0.41* 0.49 0.33*  GFRNONAA >60 >60 >60 123  GFRAA >60 >60 >60 143    LIVER FUNCTION TESTS: Recent Labs    06/24/19 0511 06/25/19 0418 06/28/19 0321 07/14/19 1754 07/30/19 0000  BILITOT 1.4* 1.3* 1.2 2.1*  2.1* 1.8*  AST 38 36 38 57*  57* 69*  ALT 15 15 16 20  20  30*  ALKPHOS 367* 411* 402* 354*  361*  --   PROT 4.6* 4.5* 4.5* 5.8*  5.8* 5.3*  ALBUMIN 2.5* 2.4* 2.4* 3.3*  3.3*  --     Assessment and Plan:  58 y.o. female outpatient. History of decomepnsated alcoholic cirrhosis with recurrent ascites, esophageal varices, a fib not on anticoagulation due to GI Bleed in October 2020 presents to [facility] for TIPS procedure.  Na+ MELD score 13 (INR from 11.14.20) Child Pugh 8 points Class B.  Pertinent Imagining 11.15.20 - MR Liver reads Morphologic features of liver compatible with cirrhosis. No specific findings identified to suggest hepatoma. 2. Stigmata of portal venous hypertension including  splenomegaly, esophageal varices and ascites 11.14.20 - CT abd pelvis reads Changes of cirrhosis with splenomegaly and large volume ascites.  5.5 cm exophytic appearing masslike area off the inferior medial right hepatic lobe. This could reflect regenerating nodule, but recommend further evaluation with elective MRI of the liver to exclude hepatocellular carcinoma  Pertinent IR History 12.16.20 - Paracentesis -4.9 L off fluid removed 12.3.20 - paracentesis - 3.8 L of fluid removed 11.23.20 - Paracentesis - 2.9 L of fluid removed 11.16.20 - Paracentesis - 5.1 L of fluid removed 11.12.20 - Paracentesis - 5.7 L of fluid removed 10.28.20 - paracentesis - 3.9 L of fluid removed 10.12.20 - Paracentesis - 2.3 L of fluid removed 10.8.20 - Paracentesis- 2.6 L of fluid removed 10.5.20 - Paracentesis - 2.4 L of fluid removed 5.22.20 - Paracentesis - 1 liter of fluid removed  Risks and benefits of TIPS, BRTO and/or additional variceal embolization were discussed with the patient and/or the patient's family including, but not limited to, infection, bleeding, damage to adjacent structures, worsening hepatic and/or cardiac function, worsening and/or the development of altered mental status/encephalopathy, non-target embolization and death.   This interventional procedure involves the use of X-rays and because of the nature of the planned procedure, it is possible that we will have prolonged use of X-ray fluoroscopy.  Potential radiation risks to you include (but are not limited to) the following: - A slightly elevated risk for cancer  several years later in life. This risk is typically less than 0.5%  percent. This risk is low in comparison to the normal incidence of human cancer, which is 33% for women and 50% for men according to the [place]. - Radiation induced injury can include skin redness, resembling a rash, tissue breakdown / ulcers and hair loss (which can be temporary or permanent).   The likelihood of either of these occurring depends on the difficulty of the procedure and whether you are sensitive to radiation due to previous procedures, disease, or genetic conditions.   IF your procedure requires a prolonged use of radiation, you will be notified and given written instructions for further action.  It is your responsibility to monitor the irradiated area for the 2 weeks following the procedure and to notify your physician if you are concerned that you have suffered a radiation induced injury.    All of the patient's questions were answered, patient is agreeable to proceed.  Consent signed and in chart.   Thank you for this interesting consult.  I greatly enjoyed meeting Anesia Huther and look forward to participating in their care.  A copy of this report was sent to the requesting provider on this date.  Electronically Signed: Avel Peace, NP 08/27/2019, 8:46 AM   I spent a total of  25 Minutes in face to face in clinical consultation, greater than 50% of which was counseling/coordinating care for TIPS

## 2019-08-28 ENCOUNTER — Encounter: Payer: Self-pay | Admitting: *Deleted

## 2019-08-28 DIAGNOSIS — K7031 Alcoholic cirrhosis of liver with ascites: Secondary | ICD-10-CM | POA: Diagnosis not present

## 2019-08-28 LAB — AMMONIA: Ammonia: 54 umol/L — ABNORMAL HIGH (ref 9–35)

## 2019-08-28 MED ORDER — LACTULOSE 10 GM/15ML PO SOLN: 30.0000 g | Freq: Three times a day (TID) | ORAL | 2 refills | Status: AC

## 2019-08-28 NOTE — Discharge Summary (Signed)
Patient ID: Anita Reed MRN: [id number] DOB/AGE: 1961-05-16 58 y.o.  Admit date: 08/27/2019 Discharge date: 08/28/2019  Supervising Physician: [name]  Patient Status: [facility] - In-pt  Admission Diagnoses: Alcoholic Cirrhosis, portal venous hypertension  with recurrent ascites    Discharge Diagnoses:  Active Problems:   Ascites   Discharged Condition: stable  Hospital Course: Admitted on 12.28.20 for TIPS creation and therapeutic paracentesis due to recurrent ascites. TIP was performed successfully without complications. Patient was admitted overnight for observation. Patient able to tolerate ADL without complaint. Amonnia level 54  No encephalopathy noted. Patient will be discharged on lactulose 45 ml tid. Patient will follow up with IR in 4 weeks.IR contact information given patient was encouraged to called with questions and concerns. Return precautions reviewed. Patient verbalized understanding and is in agreement with plan of care.   Consults: None  Significant Diagnostic Studies: US Paracentesis  Result Date: 08/15/2019 INDICATION: Ascites due to alcoholic cirrhosis. EXAM: ULTRASOUND GUIDED PARACENTESIS MEDICATIONS: None. COMPLICATIONS: None immediate. PROCEDURE: Informed written consent was obtained from the patient after a discussion of the risks, benefits and alternatives to treatment. A timeout was performed prior to the initiation of the procedure. Initial ultrasound scanning demonstrates a large amount of ascites within the right lower abdominal quadrant. The right lower abdomen was prepped and draped in the usual sterile fashion. 1% lidocaine was used for local anesthesia. Initially, 6 [place] Safe-T-Centesis catheters were placed in the right lower quadrant but there was minimal drainage and likely related to adjacent bowel. Therefore, a 19 gauge, 7-cm, Yueh catheter was introduced. An ultrasound image was saved for documentation purposes. The paracentesis was  performed. The catheter was removed and a dressing was applied. The patient tolerated the procedure well without immediate post procedural complication. FINDINGS: A total of approximately 4.9 L of yellow fluid was removed. IMPRESSION: Successful ultrasound-guided paracentesis yielding 4.9 L liters of peritoneal fluid. Electronically Signed   By: Markus Daft M.D.   On: 08/15/2019 16:54   US Paracentesis  Result Date: 08/02/2019 INDICATION: Cirrhosis. EXAM: ULTRASOUND GUIDED  PARACENTESIS MEDICATIONS: None. COMPLICATIONS: None immediate. PROCEDURE: Informed written consent was obtained from the patient after a discussion of the risks, benefits and alternatives to treatment. A timeout was performed prior to the initiation of the procedure. Initial ultrasound scanning demonstrates a large amount of ascites within the right lower abdominal quadrant. The right lower abdomen was prepped and draped in the usual sterile fashion. 1% lidocaine was used for local anesthesia. Following this, a 6 French catheter was introduced. An ultrasound image was saved for documentation purposes. The paracentesis was performed. The catheter was removed and a dressing was applied. The patient tolerated the procedure well without immediate post procedural complication. Patient received post-procedure intravenous albumin; see nursing notes for details. FINDINGS: A total of approximately 3.8 L of yellow fluid fluid was removed. IMPRESSION: Successful ultrasound-guided paracentesis yielding 3.8 liters of peritoneal fluid. Electronically Signed   By: Marcello Moores  Register   On: 08/02/2019 15:05   IR Tips  Result Date: 08/27/2019 CLINICAL DATA:  Cirrhosis, portal venous hypertension, recurrent large volume abdominal ascites requiring multiple percutaneous paracentesis procedures. History of atrial fibrillation. EXAM: 1. TIPS CREATION 2. ULTRASOUND-GUIDED VENOUS ACCESS 3. PARACENTESIS WITH ULTRASOUND GUIDANCE ANESTHESIA/SEDATION: General - as  administered by the Anesthesia department MEDICATIONS: Cefazolin 2 g IV within 1 hour of procedure start FLUOROSCOPY TIME:  17.4 minutes; 123XX123  uGym2 DAP COMPLICATIONS: None immediate. PROCEDURE: Informed written consent was obtained from the patient after a thorough discussion  of the procedural risks, benefits and alternatives. All questions were addressed. See previous consultation. Maximal Sterile Barrier Technique was utilized including caps, mask, sterile gowns, sterile gloves, sterile drape, hand hygiene and skin antiseptic. A timeout was performed prior to the initiation of the procedure. The skin overlying the right upper abdominal quadrant as well as the right neck  region were prepped and draped in usual sterile fashion. Initial ultrasound scanning demonstrates a moderate amount of recurrent perihepatic ascites. Under ultrasound guidance, a 6 [place] Safe-T-Centesis needle was advanced into the peritoneal cavity from a right lateral approach for paracentesis removing 4.8 L clear yellow ascites. Under direct ultrasound guidance, a peripheral aspect of the right portal vein was accessed with a 21 gauge micropuncture needle. Ultrasound image was saved for procedural documentation purposes. Guidewire advanced into the main portal vein. Micropuncture 3 French dilator was advanced over the guidewire, with the radiopaque transition positioned at the target entrance to the  right portal vein. Next, the right internal jugular vein was accessed under direct ultrasound with micropuncture set. Ultrasound image was saved for procedural documentation purposes. This allowed for placement of the 10 [place] TIPS vascular sheath over a Bentson wire. With the aid of angiographic guidewires, an MPA catheter was utilized to select the right hepatic vein and a hepatic venogram was performed.  Sheath advanced into the right hepatic vein. Under fluoroscopic guidance, the right portal vein with targeted with a Rsch-Uchida TIPS  needle directed at the radiopaque target within the right portal vein. Ultimately, the right portal vein was accessed at a desirable location allowing advancement of a stiff Glidewire into the main portal vein. A 4 French glide catheter was advanced over the stiff Glidewire. Next, catheter exchanged over guidewire a portal venogram was performed with a measuring Omni Flush catheter. Tract dilated with the 5 mm mustang balloon over the wire to facilitate advancement of the sheath into the main portal vein. Next, a 2 cm (un covered) x 6 cm (covered) x 10 mm diameter GORE VIATORR TIPS Endoprosthesis was advanced through the sheath across the intra hepatic track and deployed. The TIPS stent was angioplastied in multiple stations to 10 mm diameter. Follow-up portal venogram demonstrated good stent deployment and flow, no residual Stenosis. Pressure measurements were obtained: Right atrium 21/15 (18) mmHg, portal vein monophasic mean 30 mmHg, portosystemic gradient 12 mmHg. All wires, catheters and sheaths were removed from the patient. Hemostasis was achieved at the right IJ access site with manual compression. A dressing was placed. The patient tolerated the procedure well. Patient transferred to the PACU. IMPRESSION: 1. Technically successful TIPS creation with final mean portosystemic gradient 12 mmHg. 2. Ultrasound guided paracentesis removing 4.8 L clear yellow ascites. Electronically Signed   By: Lucrezia Europe M.D.   On: 08/27/2019 13:03   IR Radiologist Eval & Mgmt  Result Date: 08/08/2019 Please refer to notes tab for details about interventional procedure. (Op Note)  IR Paracentesis  Result Date: 08/27/2019 CLINICAL DATA:  Cirrhosis, portal venous hypertension, recurrent large volume abdominal ascites requiring multiple percutaneous paracentesis procedures. History of atrial fibrillation. EXAM: 1. TIPS CREATION 2. ULTRASOUND-GUIDED VENOUS ACCESS 3. PARACENTESIS WITH ULTRASOUND GUIDANCE ANESTHESIA/SEDATION:  General - as administered by the Anesthesia department MEDICATIONS: Cefazolin 2 g IV within 1 hour of procedure start FLUOROSCOPY TIME:  17.4 minutes; [id number]  uGym2 DAP COMPLICATIONS: None immediate. PROCEDURE: Informed written consent was obtained from the patient after a thorough discussion of the procedural risks, benefits and alternatives. All questions were addressed. See previous  consultation. Maximal Sterile Barrier Technique was utilized including caps, mask, sterile gowns, sterile gloves, sterile drape, hand hygiene and skin antiseptic. A timeout was performed prior to the initiation of the procedure. The skin overlying the right upper abdominal quadrant as well as the right neck  region were prepped and draped in usual sterile fashion. Initial ultrasound scanning demonstrates a moderate amount of recurrent perihepatic ascites. Under ultrasound guidance, a 6 [place] Safe-T-Centesis needle was advanced into the peritoneal cavity from a right lateral approach for paracentesis removing 4.8 L clear yellow ascites. Under direct ultrasound guidance, a peripheral aspect of the right portal vein was accessed with a 21 gauge micropuncture needle. Ultrasound image was saved for procedural documentation purposes. Guidewire advanced into the main portal vein. Micropuncture 3 French dilator was advanced over the guidewire, with the radiopaque transition positioned at the target entrance to the  right portal vein. Next, the right internal jugular vein was accessed under direct ultrasound with micropuncture set. Ultrasound image was saved for procedural documentation purposes. This allowed for placement of the 10 [place] TIPS vascular sheath over a Bentson wire. With the aid of angiographic guidewires, an MPA catheter was utilized to select the right hepatic vein and a hepatic venogram was performed.  Sheath advanced into the right hepatic vein. Under fluoroscopic guidance, the right portal vein with targeted with a  Rsch-Uchida TIPS needle directed at the radiopaque target within the right portal vein. Ultimately, the right portal vein was accessed at a desirable location allowing advancement of a stiff Glidewire into the main portal vein. A 4 French glide catheter was advanced over the stiff Glidewire. Next, catheter exchanged over guidewire a portal venogram was performed with a measuring Omni Flush catheter. Tract dilated with the 5 mm mustang balloon over the wire to facilitate advancement of the sheath into the main portal vein. Next, a 2 cm (un covered) x 6 cm (covered) x 10 mm diameter GORE VIATORR TIPS Endoprosthesis was advanced through the sheath across the intra hepatic track and deployed. The TIPS stent was angioplastied in multiple stations to 10 mm diameter. Follow-up portal venogram demonstrated good stent deployment and flow, no residual Stenosis. Pressure measurements were obtained: Right atrium 21/15 (18) mmHg, portal vein monophasic mean 30 mmHg, portosystemic gradient 12 mmHg. All wires, catheters and sheaths were removed from the patient. Hemostasis was achieved at the right IJ access site with manual compression. A dressing was placed. The patient tolerated the procedure well. Patient transferred to the PACU. IMPRESSION: 1. Technically successful TIPS creation with final mean portosystemic gradient 12 mmHg. 2. Ultrasound guided paracentesis removing 4.8 L clear yellow ascites. Electronically Signed   By: Lucrezia Europe M.D.   On: 08/27/2019 13:03    Treatments: surgery: TIPS creation and paracentesis  Discharge Exam: Blood pressure (!) 84/54, pulse 68, temperature 98.4 F (36.9 C), temperature source Oral, resp. rate 18, height 5\' 9"  (1.753 m), weight 145 lb (65.8 kg), SpO2 90 %. Physical Exam Vitals and nursing note reviewed.  Constitutional:      Appearance: She is well-developed.  HENT:     Head: Normocephalic and atraumatic.  Eyes:     Conjunctiva/sclera: Conjunctivae normal.  Pulmonary:      Effort: Pulmonary effort is normal.  Musculoskeletal:     Cervical back: Normal range of motion.  Neurological:     Mental Status: She is alert and oriented to person, place, and time.     Disposition: Home   Allergies as of 08/28/2019   No  Known Allergies     Medication List    TAKE these medications   digoxin 0.125 MG tablet Commonly known as: LANOXIN Take 1 tablet (0.125 mg total) by mouth daily.   FLUoxetine 10 MG capsule Commonly known as: PROZAC Take 1 capsule (10 mg total) by mouth daily.   furosemide 40 MG tablet Commonly known as: LASIX Take 0.5 tablets (20 mg total) by mouth daily.   gabapentin 400 MG capsule Commonly known as: Neurontin Take 1 capsule (400 mg total) by mouth 3 (three) times daily.   lactulose 10 GM/15ML solution Commonly known as: CHRONULAC Take 45 mLs (30 g total) by mouth 3 (three) times daily.   midodrine 5 MG tablet Commonly known as: PROAMATINE Take 1 tablet (5 mg total) by mouth 3 (three) times daily with meals.   thiamine 100 MG tablet Take 100 mg by mouth daily. Vitamin B-1   tiZANidine 4 MG capsule Commonly known as: ZANAFLEX Take 1 capsule (4 mg total) by mouth 3 (three) times daily as needed for muscle spasms.      Follow-up Information    [name], MD Follow up in 4 week(s).   Specialties: Interventional Radiology, Radiology Why: IR scheduler will call with appoinment date and time. Please call [phone] with any questions or concerns Contact information: [address] [place] [address] [id number]            Electronically Signed: Avel Peace, NP 08/28/2019, 8:57 AM   I have spent Less Than 30 Minutes discharging Anita Reed.

## 2019-08-28 NOTE — Progress Notes (Signed)
Pt is ambulating in the room, steady gait. Abd is distended but soft. Denies pain except for a headache. Tolerated regular diet. Discharged to home accompanied by husband.

## 2019-08-29 ENCOUNTER — Telehealth: Payer: Self-pay | Admitting: Interventional Radiology

## 2019-08-29 NOTE — Telephone Encounter (Signed)
Returned pt phone call.  She was concerned about lactulose dose. She was concerned about GI side effects from 45mg  TID. We will start gently with 5mg . She will f/u with Dr Marius Ditch GI for titration as needed.

## 2019-08-31 ENCOUNTER — Other Ambulatory Visit: Payer: Self-pay | Admitting: Family Medicine

## 2019-08-31 DIAGNOSIS — I959 Hypotension, unspecified: Secondary | ICD-10-CM

## 2019-09-02 NOTE — Telephone Encounter (Signed)
Requested medication (s) are due for refill today: yes  Requested medication (s) are on the active medication list: yes  Last refill:  07/27/2019  Future visit scheduled: yes  Notes to clinic:  cardiovascular: Midodrine failed  Requested Prescriptions  Pending Prescriptions Disp Refills   midodrine (PROAMATINE) 5 MG tablet [Pharmacy Med Name: Midodrine HCl 5 MG Oral Tablet] 90 tablet 0    Sig: TAKE 1 TABLET BY MOUTH THREE TIMES DAILY WITH MEALS      Not Delegated - Cardiovascular: Midodrine Failed - 08/31/2019 11:55 AM      Failed - This refill cannot be delegated      Passed - Last BP in normal range    BP Readings from Last 1 Encounters:  08/28/19 (!) 98/50          Passed - Valid encounter within last 12 months    Recent Outpatient Visits           2 weeks ago Muscle spasticity   [place], Astrid Divine, FNP   1 month ago Gastrointestinal hemorrhage, unspecified gastrointestinal hemorrhage type   [facility] Hubbard Hartshorn, FNP   2 months ago Gastrointestinal hemorrhage, unspecified gastrointestinal hemorrhage type   [facility] Hubbard Hartshorn, FNP   5 months ago Cervical cancer screening   [place], FNP   6 months ago Typical atrial flutter [facility])   [place], Astrid Divine, FNP       Future Appointments             In 1 week Hubbard Hartshorn, [facility], [place]   In 3 weeks Vanga, Tally Due, MD [place]   In 1 month Gollan, Kathlene November, MD [place], [facility]             Signed Prescriptions Disp Refills   digoxin (LANOXIN) 0.125 MG tablet 30 tablet 0    Sig: Take 1 tablet by mouth once daily      Cardiovascular:  Antiarrhythmic Agents - digoxin Passed - 08/31/2019 11:55 AM      Passed - Cr in normal range and within 180 days    Creat  Date Value Ref Range Status  07/30/2019 0.33  (L) 0.50 - 1.05 mg/dL Final    Comment:    For patients >82 years of age, the reference limit for Creatinine is approximately 13% higher for people identified as African-American. .    Creatinine, Ser  Date Value Ref Range Status  08/27/2019 0.58 0.44 - 1.00 mg/dL Final          Passed - Digoxin (serum) in normal range and within 180 days    Digoxin Level  Date Value Ref Range Status  08/07/2019 0.8 0.8 - 2.0 ng/mL Final    Comment:    Performed at [facility], [address] [place],  [address]          Passed - Ca in normal range and within 180 days    Calcium  Date Value Ref Range Status  08/27/2019 9.0 8.9 - 10.3 mg/dL Final          Passed - K in normal range and within 180 days    Potassium  Date Value Ref Range Status  08/27/2019 4.3 3.5 - 5.1 mmol/L Final          Passed - Last Heart Rate in normal range  Pulse Readings from Last 1 Encounters:  08/28/19 70          Passed - Valid encounter within last 6 months    Recent Outpatient Visits           2 weeks ago Muscle spasticity   [place], [place], FNP   1 month ago Gastrointestinal hemorrhage, unspecified gastrointestinal hemorrhage type   [place], FNP   2 months ago Gastrointestinal hemorrhage, unspecified gastrointestinal hemorrhage type   [place], FNP   5 months ago Cervical cancer screening   [place], FNP   6 months ago Typical atrial flutter [facility])   [place], [place], FNP       Future Appointments             In 1 week Hubbard Hartshorn, [facility], [place]   In 3 weeks Vanga, Tally Due, MD [place]   In 1 month Gollan, Kathlene November, MD [facility], [place]

## 2019-09-03 NOTE — Telephone Encounter (Signed)
I had provided urgent 30-day supply of midodrine in November, she needs to obtain additional refills from her Cardiologist/Gastroenterology team.  If completely out, I can provide a 2 week supply.

## 2019-09-05 NOTE — Telephone Encounter (Signed)
Spoke to patient and her husband and notified them both that whomever started her on medication must refill medication. Patient stated she got at hospital and was not sure. She was informed to call Cardiology first

## 2019-09-05 NOTE — Telephone Encounter (Signed)
30 day supply is provided to allow time to contact cardiology

## 2019-09-05 NOTE — Telephone Encounter (Signed)
FYI

## 2019-09-06 ENCOUNTER — Other Ambulatory Visit: Payer: Medicare Other | Admitting: Nurse Practitioner

## 2019-09-06 ENCOUNTER — Telehealth: Payer: Self-pay | Admitting: Nurse Practitioner

## 2019-09-06 ENCOUNTER — Other Ambulatory Visit: Payer: Self-pay

## 2019-09-06 NOTE — Telephone Encounter (Signed)
I called Anita Reed for scheduled telemedicine followup palliative care visit, no answer, message left with contact information.

## 2019-09-10 ENCOUNTER — Encounter: Payer: Self-pay | Admitting: Family Medicine

## 2019-09-10 ENCOUNTER — Ambulatory Visit (INDEPENDENT_AMBULATORY_CARE_PROVIDER_SITE_OTHER): Payer: Medicare Other | Admitting: Family Medicine

## 2019-09-10 ENCOUNTER — Other Ambulatory Visit: Payer: Self-pay

## 2019-09-10 ENCOUNTER — Telehealth: Payer: Self-pay | Admitting: Gastroenterology

## 2019-09-10 VITALS — BP 102/64 | HR 71 | Temp 97.9°F | Resp 16 | Ht 69.0 in | Wt 134.5 lb

## 2019-09-10 DIAGNOSIS — K7031 Alcoholic cirrhosis of liver with ascites: Secondary | ICD-10-CM | POA: Diagnosis not present

## 2019-09-10 DIAGNOSIS — I959 Hypotension, unspecified: Secondary | ICD-10-CM

## 2019-09-10 DIAGNOSIS — M62838 Other muscle spasm: Secondary | ICD-10-CM

## 2019-09-10 DIAGNOSIS — Z8719 Personal history of other diseases of the digestive system: Secondary | ICD-10-CM

## 2019-09-10 DIAGNOSIS — G8929 Other chronic pain: Secondary | ICD-10-CM

## 2019-09-10 DIAGNOSIS — E46 Unspecified protein-calorie malnutrition: Secondary | ICD-10-CM

## 2019-09-10 DIAGNOSIS — I4892 Unspecified atrial flutter: Secondary | ICD-10-CM | POA: Diagnosis not present

## 2019-09-10 DIAGNOSIS — D649 Anemia, unspecified: Secondary | ICD-10-CM

## 2019-09-10 DIAGNOSIS — F341 Dysthymic disorder: Secondary | ICD-10-CM

## 2019-09-10 MED ORDER — FLUOXETINE HCL 20 MG PO TABS: 20.0000 mg | ORAL_TABLET | Freq: Every day | ORAL | 3 refills | Status: DC

## 2019-09-10 NOTE — Telephone Encounter (Signed)
Why does she have muscle spasticity? She does have severe iron deficiency which can lead to restless legs, nocturnal cramps, need to replete her iron, can refer to hematology for IV iron now that she has TIPS, uptodate has good info on treatment. I prefer not to increase the dose of tizanidine. B complex, Gabapentin, muscle relaxants, including carisoprodol and orphenadrine; quinidine (200 mg twice daily) are other options. Carisoprodol is associated with withdrawal, used short term only  Let's refer her to hematology for IV iron  Thanks RV

## 2019-09-10 NOTE — Telephone Encounter (Signed)
-----   Message from Hubbard Hartshorn, [place] sent at 09/10/2019  1:45 PM EST ----- Regarding: Muscle Spasticity Hi Dr. Marius Ditch,  This patient is seeing me in office today.  She is having ongoing muscle spasticity/chronic pain.  She has been taking tizanidine 4mg  TID and gabapentin 400mg  TID with only minimal relief of her pain.  She is resistant to pain management due to cost.  I would like to increase her to 6mg  tizanidine TID, but wanted to check in with you first as I am concerned about her liver impairment in combination with this muscle relaxer at such a high dose.  Please let me know your thoughts and/or any additional recommendations.  Thank you! Raelyn Ensign NP-C

## 2019-09-10 NOTE — Progress Notes (Signed)
Name: Anita Reed   MRN: [id number]    DOB: [birth year]   Date:09/10/2019       Progress Note  Subjective  Chief Complaint  Chief Complaint  Patient presents with  . Follow-up    HPI  Neuropathy, chronic pain, and muscle spasticity:  She is prescribed to take gabapentin 400mg  TID.  She is also taking 4mg  Tizandidine TID for her pain and ongoing lower extremity muscle spasms (worse when her BLE edema worsens). She is no longer doing Physical therapy. She does not want pain management consult at this time due to cost.  We will reach out to Dr. Marius Ditch to inquire about increasing to 6mg  TID dosing - her patient's hepatic impairment we will proceed with great caution and consult GI regarding dosing.   Decompensated Cirrhosis with ascitis and hx GI bleed - has paracentesis while admitted on 11/16, again multiple times as outpatient.  She had TIPS procedures 08/27/2019 and her abdominal swelling has been much improved.  She has ongoing BLE edema up to her knees.   Taking lactulose BID, Lasix 20mg  once daily.  Overall doing better since procedure; has GI follow up 09/27/2019.    Protein Malnutrition: Found on labs in December, discussed increasing protein, boost/ensure twice daily to help improve these labs.  Hypotension/Hx Aflutter - she is taking her midodrine, needs refill as she is nearly out.  She has BP checked a few times a week with PT and states it has been in normal range.  No lightheadedness or dizziness/near-syncope.  She was referred to cardiology - has follow up in March 2021 with Dr. Rockey Situ.  Depression - She has been connected with palliative care while in the hospital.  She was started on Prozac 10mg  while admitted, and feels it has been improving her mood - would like to go up a bit to gain better efficacy.  We will increase to 20mg .  Patient Active Problem List   Diagnosis Date Noted  . Dysthymia 07/27/2019  . Other chronic pain 07/27/2019  . Muscle spasticity 07/27/2019    . C. difficile colitis   . Hypokalemia 07/15/2019  . Bandemia 07/15/2019  . Hypotension   . Goals of care, counseling/discussion   . Palliative care encounter   . Muscle cramping 06/22/2019  . Alcohol abuse 06/22/2019  . Sepsis ([place]) 06/22/2019  . GIB (gastrointestinal bleeding) 06/01/2019  . GI bleeding 06/01/2019  . History of lymphoma 02/26/2019  . Atrial flutter ([place]) 02/07/2019  . Alcoholic cirrhosis of liver with ascites ([place]) 02/07/2019  . Persistent atrial fibrillation ([place]) 02/07/2019  . Anemia 02/07/2019  . Anorexia 02/07/2019  . GI bleed 01/28/2019  . Ascites 01/18/2019  . Cancer [facility])     Past Surgical History:  Procedure Laterality Date  . ABDOMINAL HYSTERECTOMY    . APPENDECTOMY    . COLONOSCOPY N/A 06/03/2019   Procedure: COLONOSCOPY;  Surgeon: Toledo, Benay Pike, MD;  Location: [facility] ENDOSCOPY;  Service: Gastroenterology;  Laterality: N/A;  . ESOPHAGOGASTRODUODENOSCOPY N/A 01/29/2019   Procedure: ESOPHAGOGASTRODUODENOSCOPY (EGD);  Surgeon: [name], MD;  Location: [facility] ENDOSCOPY;  Service: Gastroenterology;  Laterality: N/A;  . ESOPHAGOGASTRODUODENOSCOPY N/A 06/01/2019   Procedure: ESOPHAGOGASTRODUODENOSCOPY (EGD);  Surgeon: Toledo, Benay Pike, MD;  Location: [facility] ENDOSCOPY;  Service: Gastroenterology;  Laterality: N/A;  . IR PARACENTESIS  08/27/2019  . IR RADIOLOGIST EVAL & MGMT  08/08/2019  . IR TIPS  08/27/2019  . RADIOLOGY WITH ANESTHESIA N/A 08/27/2019   Procedure: IR WITH ANESTHESIA TIPS PROCEDURE;  Surgeon: [name],  MD;  Location: [place];  Service: Radiology;  Laterality: N/A;  . TONSILLECTOMY      Family History  Problem Relation Age of Onset  . Cancer Mother   . Cancer Father     Social History   Socioeconomic History  . Marital status: Married    Spouse name: [name]  . Number of children: 1  . Years of education: 69  . Highest education level: 9th grade  Occupational History  . Occupation: disabled  Tobacco Use  . Smoking  status: Current Every Day Smoker    Packs/day: 1.00    Years: 35.00    Pack years: 35.00    Types: Cigarettes  . Smokeless tobacco: Never Used  Substance and Sexual Activity  . Alcohol use: Not Currently  . Drug use: Never  . Sexual activity: Not Currently  Other Topics Concern  . Not on file  Social History Narrative  . Not on file   Social Determinants of Health   Financial Resource Strain: Medium Risk  . Difficulty of Paying Living Expenses: Somewhat hard  Food Insecurity: Food Insecurity Present  . Worried About Charity fundraiser in the Last Year: Sometimes true  . Ran Out of Food in the Last Year: Sometimes true  Transportation Needs: No Transportation Needs  . Lack of Transportation (Medical): No  . Lack of Transportation (Non-Medical): No  Physical Activity: Inactive  . Days of Exercise per Week: 0 days  . Minutes of Exercise per Session: 0 min  Stress: No Stress Concern Present  . Feeling of Stress : Only a little  Social Connections: Moderately Isolated  . Frequency of Communication with Friends and Family: Once a week  . Frequency of Social Gatherings with Friends and Family: Never  . Attends Religious Services: Never  . Active Member of Clubs or Organizations: No  . Attends Archivist Meetings: Never  . Marital Status: Married  Human resources officer Violence: Not At Risk  . Fear of Current or Ex-Partner: No  . Emotionally Abused: No  . Physically Abused: No  . Sexually Abused: No     Current Outpatient Medications:  .  digoxin (LANOXIN) 0.125 MG tablet, Take 1 tablet by mouth once daily, Disp: 30 tablet, Rfl: 0 .  FLUoxetine (PROZAC) 10 MG capsule, Take 1 capsule (10 mg total) by mouth daily., Disp: 30 capsule, Rfl: 3 .  furosemide (LASIX) 40 MG tablet, Take 0.5 tablets (20 mg total) by mouth daily., Disp: 30 tablet, Rfl: 0 .  gabapentin (NEURONTIN) 400 MG capsule, Take 1 capsule (400 mg total) by mouth 3 (three) times daily., Disp: 270 capsule, Rfl:  1 .  lactulose (CHRONULAC) 10 GM/15ML solution, Take 45 mLs (30 g total) by mouth 3 (three) times daily., Disp: 236 mL, Rfl: 2 .  midodrine (PROAMATINE) 5 MG tablet, TAKE 1 TABLET BY MOUTH THREE TIMES DAILY WITH MEALS, Disp: 30 tablet, Rfl: 0 .  thiamine 100 MG tablet, Take 100 mg by mouth daily. Vitamin B-1, Disp: , Rfl:  .  tiZANidine (ZANAFLEX) 4 MG capsule, Take 1 capsule (4 mg total) by mouth 3 (three) times daily as needed for muscle spasms., Disp: 270 capsule, Rfl: 0  No Known Allergies  I personally reviewed active problem list, medication list, allergies, notes from last encounter, lab results with the patient/caregiver today.   ROS  Ten systems reviewed and is negative except as mentioned in HPI  Objective  Vitals:   09/10/19 1301  BP: 102/64  Pulse: 71  Resp: 16  Temp: 97.9 F (36.6 C)  TempSrc: Temporal  SpO2: 94%  Weight: 134 lb 8 oz (61 kg)  Height: 5\' 9"  (1.753 m)    Body mass index is 19.86 kg/m.  Physical Exam  Constitutional: Patient appears well-developed and well-nourished. No distress.  HENT: Head: Normocephalic and atraumatic. Ears: bilateral TMs with no erythema or effusion; Nose: Nose normal. Mouth/Throat: Oropharynx is clear and moist. No oropharyngeal exudate or tonsillar swelling.  Eyes: Conjunctivae and EOM are normal. No scleral icterus.  Pupils are equal, round, and reactive to light.  Neck: Normal range of motion. Neck supple. No JVD present. No thyromegaly present.  Cardiovascular: Normal rate, regular rhythm and normal heart sounds.  No murmur heard. BLE edema is present and +2 today. Pulmonary/Chest: Effort normal and breath sounds normal. No respiratory distress. Abdominal: Soft. Bowel sounds are normal, very, minimal distension present (significant improvement from last in person). There is very mild tenderness over umbilical hernia - no discoloration, no heat. Musculoskeletal: Normal range of motion, no joint effusions. No gross  deformities Neurological: Pt is alert and oriented to person, place, and time. No cranial nerve deficit. Coordination, balance, strength, speech and gait are normal.  Skin: Skin is warm and dry. No rash noted. No erythema.  Psychiatric: Patient has a normal mood and affect. behavior is normal. Judgment and thought content normal.  No results found for this or any previous visit (from the past 72 hour(s)).  PHQ2/9: Depression screen [facility] 2/9 09/10/2019 08/13/2019 07/27/2019 06/22/2019 04/02/2019  Decreased Interest 0 0 1 0 0  Down, Depressed, Hopeless 0 0 1 0 0  PHQ - 2 Score 0 0 2 0 0  Altered sleeping 0 0 0 0 0  Tired, decreased energy 0 0 0 0 0  Change in appetite 0 0 0 0 0  Feeling bad or failure about yourself  0 0 0 0 0  Trouble concentrating 0 0 0 0 0  Moving slowly or fidgety/restless 0 0 0 0 0  Suicidal thoughts 0 0 - 0 0  PHQ-9 Score 0 0 2 0 0  Difficult doing work/chores Not difficult at all Not difficult at all Not difficult at all Not difficult at all Not difficult at all   PHQ-2/9 Result is negative.    Fall Risk: Fall Risk  09/10/2019 08/13/2019 07/27/2019 06/22/2019 04/02/2019  Falls in the past year? 0 0 0 0 0  Number falls in past yr: 0 0 0 0 0  Injury with Fall? 0 0 0 0 0  Follow up Falls evaluation completed Falls evaluation completed Falls evaluation completed Falls evaluation completed Falls evaluation completed    Assessment & Plan  1. Atrial flutter, unspecified type ([place]) - Followed by Cardiology; needs to obtain any future refills from their office for Digoxin and she is aware of this.  2. Hypotension, unspecified hypotension type - Followed by Cardiology;  needs to obtain any future refills from their office for Midodrine and she is aware of this.  3. Alcoholic cirrhosis of liver with ascites ([facility]) - Seeing Dr. Marius Ditch, had TIPS procedure and ascites has greatly improved, still having LE edema, however this has also improved.  Recommend elevation of legs when  able, continue with strict low sodium diet.  4. History of GI bleed - Ambulatory referral to Hematology  5. Muscle spasticity - Worsens when LE occurs/worsens.  Lengthy discussion around medication compliance, the risk of taking tizanidine with her liver impairment.  She verbalizes understanding. - I  did reach out to Dr. Marius Ditch with GI to determine next steps, and she recommends not increasing tizanidine dosing at this time.  She does recommend referral to hematology now that patient is more stable after TIPS procedure to consider her for IV Iron replacement, and this referral is placed.  Will also recommend B12 complex to the patient based on Dr. Verlin Grills recommendation.  6. Other chronic pain - I have advised that I will not provide opiate therapy for pain management, after consulting with Dr. Marius Ditch, I will not increase tizanidine dosing.  She states cannot afford pain management at this time and declines referral.  We will maintain current gabapentin dosing as well.    7. Dysthymia - Increasing Prozac to 20mg , though PHQ-9 score is negative today, she reports ongoing dysthymia surrounding her medical status and would like to go up on the medication. - FLUoxetine (PROZAC) 20 MG tablet; Take 1 tablet (20 mg total) by mouth daily.  Dispense: 30 tablet; Refill: 3  8. Protein malnutrition ([place]) - Discussed at length with her today - dietary modifications reviewed in detail  9. Anemia, unspecified type - Ambulatory referral to Hematology

## 2019-09-10 NOTE — Patient Instructions (Addendum)
-   Ensure, Boost, Orgain; also consider protein powder mixed with ice cream as a milkshake - Call Dr. Donivan Scull office for digoxin and midodrine refills

## 2019-09-11 ENCOUNTER — Encounter: Payer: Self-pay | Admitting: Family Medicine

## 2019-09-11 NOTE — Telephone Encounter (Signed)
Patient notified. Cannot afford pain management. Will go to hematologisst

## 2019-09-11 NOTE — Telephone Encounter (Signed)
Please let patient know consulted with Dr. Marius Ditch who agrees in not increasing tizanidine dosing. She does recommend Vitamin B complex (OTC if fine) once daily as well as a referral to hematology for iron repletion.  I am placing this referral.  Once again, please let patient know I am happy to refer to pain management if she would like.

## 2019-09-12 ENCOUNTER — Telehealth: Payer: Self-pay | Admitting: Nurse Practitioner

## 2019-09-12 NOTE — Telephone Encounter (Signed)
I called Anita Reed to reschedule f/u PC visit telemedicine, no answer message left with contact information

## 2019-09-24 ENCOUNTER — Ambulatory Visit (INDEPENDENT_AMBULATORY_CARE_PROVIDER_SITE_OTHER): Payer: Medicare Other | Admitting: Gastroenterology

## 2019-09-24 ENCOUNTER — Other Ambulatory Visit: Payer: Self-pay

## 2019-09-24 ENCOUNTER — Encounter: Payer: Self-pay | Admitting: Gastroenterology

## 2019-09-24 VITALS — BP 105/54 | HR 90 | Temp 98.1°F | Resp 17 | Ht 69.0 in | Wt 138.8 lb

## 2019-09-24 DIAGNOSIS — Z95828 Presence of other vascular implants and grafts: Secondary | ICD-10-CM | POA: Diagnosis not present

## 2019-09-24 DIAGNOSIS — K7290 Hepatic failure, unspecified without coma: Secondary | ICD-10-CM | POA: Diagnosis not present

## 2019-09-24 DIAGNOSIS — K7460 Unspecified cirrhosis of liver: Secondary | ICD-10-CM

## 2019-09-24 DIAGNOSIS — R0602 Shortness of breath: Secondary | ICD-10-CM | POA: Diagnosis not present

## 2019-09-24 DIAGNOSIS — M7989 Other specified soft tissue disorders: Secondary | ICD-10-CM

## 2019-09-24 DIAGNOSIS — Z23 Encounter for immunization: Secondary | ICD-10-CM

## 2019-09-24 DIAGNOSIS — G9340 Encephalopathy, unspecified: Secondary | ICD-10-CM | POA: Diagnosis not present

## 2019-09-24 DIAGNOSIS — K7031 Alcoholic cirrhosis of liver with ascites: Secondary | ICD-10-CM

## 2019-09-24 MED ORDER — RIFAXIMIN 550 MG PO TABS: 550.0000 mg | ORAL_TABLET | Freq: Two times a day (BID) | ORAL | 3 refills | Status: AC

## 2019-09-24 NOTE — Patient Instructions (Addendum)
1.  Start over-the-counter oral iron 1-2 times a day with food, this will help with your low ferritin level as well as leg cramps 2.  Continue B complex vitamins 3.  We will try rifaximin 550 mg 2 times a day for hepatic encephalopathy instead of lactulose 4.  Please follow low-sodium diet, information provided here below  5. Also, follow high-protein diet, mostly vegetable protein and lean meat-based 6.    Please call our office to speak with my nurse Barnie Alderman [phone] during business hours from 8am to 4pm if you have any questions/concerns. During after hours, you will be redirected to on call GI physician. For any emergency please call 911 or go the nearest emergency room.    Cephas Darby, MD [address]  [place]  [place], [place] [address]  Main: [phone]  Fax: [phone]       Low-Sodium Eating Plan Sodium, which is an element that makes up salt, helps you maintain a healthy balance of fluids in your body. Too much sodium can increase your blood pressure and cause fluid and waste to be held in your body. Your health care provider or dietitian may recommend following this plan if you have high blood pressure (hypertension), kidney disease, liver disease, or heart failure. Eating less sodium can help lower your blood pressure, reduce swelling, and protect your heart, liver, and kidneys. What are tips for following this plan? General guidelines  Most people on this plan should limit their sodium intake to 1,500-2,000 mg (milligrams) of sodium each day. Reading food labels   The Nutrition Facts label lists the amount of sodium in one serving of the food. If you eat more than one serving, you must multiply the listed amount of sodium by the number of servings.  Choose foods with less than 140 mg of sodium per serving.  Avoid foods with 300 mg of sodium or more per serving. Shopping  Look for lower-sodium products, often labeled as "low-sodium" or "no salt  added."  Always check the sodium content even if foods are labeled as "unsalted" or "no salt added".  Buy fresh foods. ? Avoid canned foods and premade or frozen meals. ? Avoid canned, cured, or processed meats  Buy breads that have less than 80 mg of sodium per slice. Cooking  Eat more home-cooked food and less restaurant, buffet, and fast food.  Avoid adding salt when cooking. Use salt-free seasonings or herbs instead of table salt or sea salt. Check with your health care provider or pharmacist before using salt substitutes.  Cook with plant-based oils, such as canola, sunflower, or olive oil. Meal planning  When eating at a restaurant, ask that your food be prepared with less salt or no salt, if possible.  Avoid foods that contain MSG (monosodium glutamate). MSG is sometimes added to [place] food, bouillon, and some canned foods. What foods are recommended? The items listed may not be a complete list. Talk with your dietitian about what dietary choices are best for you. Grains Low-sodium cereals, including oats, puffed wheat and rice, and shredded wheat. Low-sodium crackers. Unsalted rice. Unsalted pasta. Low-sodium bread. Whole-grain breads and whole-grain pasta. Vegetables Fresh or frozen vegetables. "No salt added" canned vegetables. "No salt added" tomato sauce and paste. Low-sodium or reduced-sodium tomato and vegetable juice. Fruits Fresh, frozen, or canned fruit. Fruit juice. Meats and other protein foods Fresh or frozen (no salt added) meat, poultry, seafood, and fish. Low-sodium canned tuna and salmon. Unsalted nuts. Dried peas, beans,  and lentils without added salt. Unsalted canned beans. Eggs. Unsalted nut butters. Dairy Milk. Soy milk. Cheese that is naturally low in sodium, such as ricotta cheese, fresh mozzarella, or Swiss cheese Low-sodium or reduced-sodium cheese. Cream cheese. Yogurt. Fats and oils Unsalted butter. Unsalted margarine with no trans fat. Vegetable  oils such as canola or olive oils. Seasonings and other foods Fresh and dried herbs and spices. Salt-free seasonings. Low-sodium mustard and ketchup. Sodium-free salad dressing. Sodium-free light mayonnaise. Fresh or refrigerated horseradish. Lemon juice. Vinegar. Homemade, reduced-sodium, or low-sodium soups. Unsalted popcorn and pretzels. Low-salt or salt-free chips. What foods are not recommended? The items listed may not be a complete list. Talk with your dietitian about what dietary choices are best for you. Grains Instant hot cereals. Bread stuffing, pancake, and biscuit mixes. Croutons. Seasoned rice or pasta mixes. Noodle soup cups. Boxed or frozen macaroni and cheese. Regular salted crackers. Self-rising flour. Vegetables Sauerkraut, pickled vegetables, and relishes. Olives. [place] fries. Onion rings. Regular canned vegetables (not low-sodium or reduced-sodium). Regular canned tomato sauce and paste (not low-sodium or reduced-sodium). Regular tomato and vegetable juice (not low-sodium or reduced-sodium). Frozen vegetables in sauces. Meats and other protein foods Meat or fish that is salted, canned, smoked, spiced, or pickled. Bacon, ham, sausage, hotdogs, corned beef, chipped beef, packaged lunch meats, salt pork, jerky, pickled herring, anchovies, regular canned tuna, sardines, salted nuts. Dairy Processed cheese and cheese spreads. Cheese curds. Blue cheese. Feta cheese. String cheese. Regular cottage cheese. Buttermilk. Canned milk. Fats and oils Salted butter. Regular margarine. Ghee. Bacon fat. Seasonings and other foods Onion salt, garlic salt, seasoned salt, table salt, and sea salt. Canned and packaged gravies. Worcestershire sauce. Tartar sauce. Barbecue sauce. Teriyaki sauce. Soy sauce, including reduced-sodium. Steak sauce. Fish sauce. Oyster sauce. Cocktail sauce. Horseradish that you find on the shelf. Regular ketchup and mustard. Meat flavorings and tenderizers. Bouillon cubes.  Hot sauce and Tabasco sauce. Premade or packaged marinades. Premade or packaged taco seasonings. Relishes. Regular salad dressings. Salsa. Potato and tortilla chips. Corn chips and puffs. Salted popcorn and pretzels. Canned or dried soups. Pizza. Frozen entrees and pot pies. Summary  Eating less sodium can help lower your blood pressure, reduce swelling, and protect your heart, liver, and kidneys.  Most people on this plan should limit their sodium intake to 1,500-2,000 mg (milligrams) of sodium each day.  Canned, boxed, and frozen foods are high in sodium. Restaurant foods, fast foods, and pizza are also very high in sodium. You also get sodium by adding salt to food.  Try to cook at home, eat more fresh fruits and vegetables, and eat less fast food, canned, processed, or prepared foods. This information is not intended to replace advice given to you by your health care provider. Make sure you discuss any questions you have with your health care provider. Document Revised: 07/29/2017 Document Reviewed: 08/09/2016 Elsevier Patient Education  [name].  Protein Content in Foods  Generally, most healthy people need around 50 grams of protein each day. Depending on your overall health, you may need more or less protein in your diet. Talk to your health care provider or dietitian about how much protein you need. See the following list for the protein content of some common foods. High-protein foods High-protein foods contain 4 grams (4 g) or more of protein per serving. They include:  Beef, ground sirloin (cooked) -- 3 oz have 24 g of protein.  Cheese (hard) -- 1 oz has 7 g of protein.  Chicken breast, boneless and skinless (cooked) -- 3 oz have 13.4 g of protein.  Cottage cheese -- 1/2 cup has 13.4 g of protein.  Egg -- 1 egg has 6 g of protein.  Fish, filet (cooked) -- 1 oz has 6-7 g of protein.  Garbanzo beans (canned or cooked) -- 1/2 cup has 6-7 g of protein.  Kidney  beans (canned or cooked) -- 1/2 cup has 6-7 g of protein.  Lamb (cooked) -- 3 oz has 24 g of protein.  Milk -- 1 cup (8 oz) has 8 g of protein.  Nuts (peanuts, pistachios, almonds) -- 1 oz has 6 g of protein.  Peanut butter -- 1 oz has 7-8 g of protein.  Pork tenderloin (cooked) -- 3 oz has 18.4 g of protein.  Pumpkin seeds -- 1 oz has 8.5 g of protein.  Soybeans (roasted) -- 1 oz has 8 g of protein.  Soybeans (cooked) -- 1/2 cup has 11 g of protein.  Soy milk -- 1 cup (8 oz) has 5-10 g of protein.  Soy or vegetable patty -- 1 patty has 11 g of protein.  Sunflower seeds -- 1 oz has 5.5 g of protein.  Tofu (firm) -- 1/2 cup has 20 g of protein.  Tuna (canned in water) -- 3 oz has 20 g of protein.  Yogurt -- 6 oz has 8 g of protein. Low-protein foods Low-protein foods contain 3 grams (3 g) or less of protein per serving. They include:  Beets (raw or cooked) -- 1/2 cup has 1.5 g of protein.  Bran cereal -- 1/2 cup has 2-3 g of protein.  Bread -- 1 slice has 2.5 g of protein.  Broccoli (raw or cooked) -- 1/2 cup has 2 g of protein.  Collard greens (raw or cooked) -- 1/2 cup has 2 g of protein.  Corn (fresh or cooked) -- 1/2 cup has 2 g of protein.  Cream cheese -- 1 oz has 2 g of protein.  Creamer (half-and-half) -- 1 oz has 1 g of protein.  Flour tortilla -- 1 tortilla has 2.5 g of protein  Frozen yogurt -- 1/2 cup has 3 g of protein.  Fruit or vegetable juice -- 1/2 cup has 1 g of protein.  [place] Kohls beans (raw or cooked) -- 1/2 cup has 1 g of protein.  Ezma Rehm peas (canned) -- 1/2 cup has 3.5 g of protein.  Muffins -- 1 small muffin (2 oz) has 3 g of protein.  Oatmeal (cooked) -- 1/2 cup has 3 g of protein.  Potato (baked with skin) -- 1 medium potato has 3 g of protein.  Rice (cooked) -- 1/2 cup has 2.5-3.5 g of protein.  Sour cream -- 1/2 cup has 2.5 g of protein.  Spinach (cooked) -- 1/2 cup has 3 g of protein.  Squash (cooked) -- 1/2 cup has 1.5 g of  protein. Actual amounts of protein may be different depending on processing. Talk with your health care provider or dietitian about what foods are recommended for you. This information is not intended to replace advice given to you by your health care provider. Make sure you discuss any questions you have with your health care provider. Document Revised: 04/26/2016 Document Reviewed: 04/26/2016 Elsevier Patient Education  2020 [employer].

## 2019-09-24 NOTE — Progress Notes (Signed)
Anita Darby, MD [address]  [place]  [place], Anita Reed [address]  Main: [phone]  Fax: [phone]    Gastroenterology Consultation  Referring Provider:     Hubbard Hartshorn, FNP Primary Care Physician:  Hubbard Hartshorn, FNP Primary Gastroenterologist:  Dr. Cephas Reed Reason for Consultation:     Decompensated alcoholic cirrhosis        HPI:   Anita Reed is a 59 y.o. female is here for hospital follow-up and management of decompensated alcoholic cirrhosis.  Patient is recently diagnosed with cirrhosis of liver in 12/2018 when she presented to [facility] with abdominal distention secondary to ascites which is consistent with portal hypertension based on fluid analysis, SSAG > 1.1.  She was discharged home on diuretics which she was not taking.  She then readmitted to [facility] secondary to active upper GI bleed about 2 weeks ago, EGD revealed Dieulafoy's lesion that was clipped.  She also had ascites at that time and underwent therapeutic paracentesis.  She was discharged home on furosemide 40 mg twice daily and spironolactone 150 mg daily, nadolol.  Her hemoglobin on discharge was 8.6.  Patient had chronic hep C which was treated in the past.  Viral load undetectable  Interval summary Patient reports that she did not catch even a drop of alcohol since last hospital admission.  She said in front of her husband that they removed all alcohol from her house.  She has been taking furosemide 40 mg twice daily but not spironolactone.  She is on Protonix 40 mg twice daily, also taking nadolol 20 mg daily.  She denies swelling in her legs but feels very gassy, abdomen distended.  She reports having irregular bowel habits chronically.  She reports her stools are orange-colored but not black or red.  She is trying to eat more protein.  Patient was seen on a tele-visit by Dr. Rockey Situ 2 days ago for a flutter which was thought to be secondary to hypokalemia.  Anticoagulation was deferred due to  recent GI bleed  Follow-up visit 03/26/2019 Patient has fever of 100.4, she does report diffuse abdominal pain, she thinks she is gassy and constipated from taking prenatal vitamins.  She is taking MiraLAX as needed and cut back on prenatal vitamins to every 3 days.  She denies subjective fevers but she had temp of 100.4 in our office today.  She said she was in the grocery store about a week ago, wearing mask.  She was tested negative for COVID twice in last 3 months.  She denies dark urine, yellowing of skin, black stools, hematemesis.  She reports not drinking alcohol but continues to smoke cigarettes  Follow-up visit 04/23/2019 Patient reports having a glass of wine for July 4 weekend.  Otherwise, she says she does not drink alcohol.  She says she feels good.  She is only concerned about severe constipation and abdominal distention with bloating, a lot of gas.  She is taking laxative as needed to have a bowel movement which is every 4 days associated with significant straining.  She denies swelling of legs, black stools, rectal bleeding, nausea, hematemesis, weight loss.  She continues to smoke.  She is taking diuretics as needed only.  She is taking Protonix, nadolol  Follow-up visit 07/30/2019 Since last office visit, patient was hospitalized twice, initially due to hematemesis.  Underwent EGD which revealed large esophageal varices with no stigmata of bleeding.  She also underwent colonoscopy, found to have pseudomembranous colitis.  She  was also diagnosed with SBP.  She was simultaneously treated for C. difficile colitis with vancomycin as well as SBP.  She was discharged home on Bactrim for SBP prophylaxis, resulted in recurrence of C. difficile and admission on 07/15/2019.  She was started on oral vancomycin.  She also underwent therapeutic paracentesis, that resulted in wound dehiscence, required placement of stent by the general surgery.  That has healed well.   She also developed A. fib with RVR,  started on digoxin per cardiology recommendations.  She cannot tolerate diuretics well due to her low blood pressure.  She is currently on 25 mg 3 times daily.  Since discharge, patient had undergone therapeutic paracentesis twice weekly last 2 weeks.  She states she has not been receiving IV albumin post paracentesis.  She is taking Lasix only 20 mg daily.  She ran out of digoxin.  Her heart rate is within normal limits today.  Blood pressure is low normal.  She denies abdominal pain, nausea, vomiting.  Reports good.  She denies black stools, her stools are well formed, she finished course of oral vancomycin pregnancy.  She said she has to call Dr. Donivan Scull office to make follow-up.  She continues to smoke.  She acknowledges not drinking alcohol.  Follow-up visit 09/19/2019 Patient underwent TIPS placement on 08/27/19 for refractory ascites needing frequent therapeutic paracentesis.  Patient was discharged home on lactulose 45 mL 3 times a day.  Patient reports that since TIPS placement, she did not have recurrence of ascites.  She feels significantly better with regards to her abdominal swelling.  Feels much comfortable.  However, she notices swelling of legs since the procedure and she felt it was because of the surgical polyps that were used during the procedure.  She is taking gabapentin and tizanidine for her muscle spasticity/leg pains which are chronic.  She continues to smoke cigarettes and does report feeling short of breath on exertion and unable to lay on her left side.  She denies cough, sputum, abdominal pain, fever, chills, nausea or vomiting.  She continues to take lactulose and having bowel movements which were loose up to 4 times a day and she feels tired.  She does acknowledge consuming canned meat, frozen fish which contain a lot of sodium.  She does say that she avoids using salt.  She is taking Lasix 20 to 40 mg daily.  She denies drinking alcohol  NSAIDs:  None  Antiplts/Anticoagulants/Anti thrombotics: None  GI Procedures: EGD 01/29/2019 - Normal duodenal bulb and second portion of the duodenum. - Severe portal hypertensive gastropathy in gastric fundus. - Large (> 5 mm) varices were found in the lower third of the esophagus, with no red wale signs or stigmata of recent bleed. - 2 cm hiatal hernia. - Dieulafoy lesion of stomach. - No specimens collected.  EGD 06/01/2019  - Grade II and large (> 5 mm) esophageal varices. - Portal hypertensive gastropathy. - Normal examined duodenum. - The examination was otherwise normal. - No specimens collected.  colonoscopy 06/03/2019  - The examined portion of the ileum was normal. - Pseudomembranous enterocolitis. - Blood in the entire examined colon. - Erythematous mucosa in the rectum and in the distal sigmoid colon. Biopsied. - Non-bleeding internal hemorrhoids. - The examination was otherwise normal. - Biopsies performed in the cecum and in the ascending colon. DIAGNOSIS:  A. COLON, RIGHT; COLD BIOPSY:  - ACTIVE COLITIS WITH SMALL FRAGMENTS OF INFLAMMATORY EXUDATE.  - SEE COMMENT.   B. COLON, DISTAL SIGMOID; COLD BIOPSY:  -  PATCHY MILD TO MODERATE ACTIVE COLITIS.  - SEE COMMENT.  Patient reports having had several colonoscopies in the past  Past Medical History:  Diagnosis Date  . Anemia   . Atrial flutter ([place])   . Cancer [facility])    lymphoma 11 yrs. ago  . Cirrhosis ([place])   . Esophageal varices ([place])   . Hepatitis    hep C-resolved  . History of hiatal hernia   . Hypertension   . Pneumonia   . Portal hypertension (HCC)     Past Surgical History:  Procedure Laterality Date  . ABDOMINAL HYSTERECTOMY    . APPENDECTOMY    . COLONOSCOPY N/A 06/03/2019   Procedure: COLONOSCOPY;  Surgeon: Toledo, Benay Pike, MD;  Location: [facility] ENDOSCOPY;  Service: Gastroenterology;  Laterality: N/A;  . ESOPHAGOGASTRODUODENOSCOPY N/A 01/29/2019   Procedure: ESOPHAGOGASTRODUODENOSCOPY (EGD);   Surgeon: [name], MD;  Location: [facility] ENDOSCOPY;  Service: Gastroenterology;  Laterality: N/A;  . ESOPHAGOGASTRODUODENOSCOPY N/A 06/01/2019   Procedure: ESOPHAGOGASTRODUODENOSCOPY (EGD);  Surgeon: Toledo, Benay Pike, MD;  Location: [facility] ENDOSCOPY;  Service: Gastroenterology;  Laterality: N/A;  . IR PARACENTESIS  08/27/2019  . IR RADIOLOGIST EVAL & MGMT  08/08/2019  . IR TIPS  08/27/2019  . RADIOLOGY WITH ANESTHESIA N/A 08/27/2019   Procedure: IR WITH ANESTHESIA TIPS PROCEDURE;  Surgeon: [name], MD;  Location: [facility];  Service: Radiology;  Laterality: N/A;  . TONSILLECTOMY      Current Outpatient Medications:  .  digoxin (LANOXIN) 0.125 MG tablet, Take 1 tablet by mouth once daily, Disp: 30 tablet, Rfl: 0 .  FLUoxetine (PROZAC) 20 MG tablet, Take 1 tablet (20 mg total) by mouth daily., Disp: 30 tablet, Rfl: 3 .  furosemide (LASIX) 40 MG tablet, Take 0.5 tablets (20 mg total) by mouth daily., Disp: 30 tablet, Rfl: 0 .  gabapentin (NEURONTIN) 400 MG capsule, Take 1 capsule (400 mg total) by mouth 3 (three) times daily., Disp: 270 capsule, Rfl: 1 .  lactulose (CHRONULAC) 10 GM/15ML solution, Take 45 mLs (30 g total) by mouth 3 (three) times daily., Disp: 236 mL, Rfl: 2 .  midodrine (PROAMATINE) 5 MG tablet, TAKE 1 TABLET BY MOUTH THREE TIMES DAILY WITH MEALS, Disp: 30 tablet, Rfl: 0 .  thiamine 100 MG tablet, Take 100 mg by mouth daily. Vitamin B-1, Disp: , Rfl:  .  tiZANidine (ZANAFLEX) 4 MG capsule, Take 1 capsule (4 mg total) by mouth 3 (three) times daily as needed for muscle spasms., Disp: 270 capsule, Rfl: 0 .  rifaximin (XIFAXAN) 550 MG TABS tablet, Take 1 tablet (550 mg total) by mouth 2 (two) times daily., Disp: 60 tablet, Rfl: 3  Family History  Problem Relation Age of Onset  . Cancer Mother   . Cancer Father      Social History   Tobacco Use  . Smoking status: Current Every Day Smoker    Packs/day: 1.00    Years: 35.00    Pack years: 35.00    Types:  Cigarettes  . Smokeless tobacco: Never Used  Substance Use Topics  . Alcohol use: Not Currently  . Drug use: Never    Allergies as of 09/24/2019  . (No Known Allergies)    Review of Systems:    All systems reviewed and negative except where noted in HPI.   Physical Exam:  BP (!) 105/54 (BP Location: Left Arm, Patient Position: Sitting, Cuff Size: Normal)   Pulse 90   Temp 98.1 F (36.7 C)   Resp 17   Ht  5\' 9"  (1.753 m)   Wt 138 lb 12.8 oz (63 kg)   BMI 20.50 kg/m  No LMP recorded. Patient has had a hysterectomy.  General:   Alert, thin built, moderately nourished, pleasant and cooperative in NAD, patient appears short of breath while speaking in full sentences Head:  Normocephalic and atraumatic, bitemporal wasting. Eyes: Anicteric.   Muddy conjunctiva Ears:  Normal auditory acuity. Nose:  No deformity, discharge, or lesions. Mouth:  No deformity or lesions,oropharynx pink & moist. Neck:  Supple; no masses or thyromegaly. Lungs:  Respirations even and unlabored.  Expiratory wheeze in right lung base, clear throughout to auscultation in left lung. Heart:  Regular rate and rhythm; no murmurs, clicks, rubs, or gallops. Abdomen:  Normal bowel sounds. Soft, non-tender and diffusely distended, nontender, without masses, hepatosplenomegaly or hernias noted.  No guarding or rebound tenderness.   Rectal: Not performed Msk:  Symmetrical without gross deformities. Good, equal movement & strength bilaterally. Pulses:  Normal pulses noted. Extremities:  No clubbing or edema.  No cyanosis. Neurologic:  Alert and oriented x3;  grossly normal neurologically. Skin:  Intact without significant lesions or rashes. No jaundice. Psych:  Alert and cooperative. Normal mood and affect.  Imaging Studies: Reviewed  Assessment and Plan:   Pernella Reinholtz is a 59 y.o. female with history of chronic hep C, status post treatment, SVR, decompensated alcoholic cirrhosis with refractory ascites, known  esophageal varices, upper GI bleed secondary to Dieulafoy's is here for follow-up  Decompensated cirrhosis of liver, secondary to alcohol use Hepatitis A and B serologies negative, s/p Twinrix x3 Chronic hep C, cured, status post treatment Portal hypertension manifested as esophageal varices, ascites Ascites: Diuretic refractory ascites, unable to tolerate diuretics due to borderline hypotension, needing frequent therapeutic paracentesis.  S/p TIPS on 08/27/2019  Continue Lasix 40 mg daily.  Reiterated to her about low-sodium diet, information provided Esophageal varices: Nonbleeding, unable to do nonselective beta-blockers due to low blood pressure Anemia: Had mild iron deficiency anemia, currently resolved Continue prenatal multivitamin HCC screening: serum AFP levels normal, CT as well as MRI did not detect any liver lesions in 07/2019 HRS: None Hepatic encephalopathy: None, started on prophylaxis post TIPS placement with lactulose, will try to switch from lactulose to rifaximin 550 mg twice daily.  However, patient is worried about paying for prescriptions out-of-pocket even though she has Medicaid Continue to remain abstinent from alcohol Administered 3 doses of Twinrix, Prevnar on 02/09/2019, Pneumovax in 05/2019 Advised her on low-sodium, high-protein diet, information provided  Muscle spasticity/leg pains Low ferritin, advised to start oral iron 1 to 2 pills daily with food Patient declined follow-up with hematology for parenteral iron therapy  Shortness of breath Recommend chest x-ray, ordered.  Patient wants to go on Friday this week Reiterated on low-sodium diet and continue to take Lasix 40 mg daily  History of A. fib with RVR Continue digoxin Follow-up with Dr.Gollan  History of C. Difficile First episode in 06/08/2019, first recurrence on 07/15/2019, both treated with oral vancomycin.  Currently, diarrhea resolved   Follow up in 2-3 months   Anita Darby, MD

## 2019-09-25 ENCOUNTER — Other Ambulatory Visit: Payer: Self-pay | Admitting: Interventional Radiology

## 2019-09-25 DIAGNOSIS — K7031 Alcoholic cirrhosis of liver with ascites: Secondary | ICD-10-CM

## 2019-09-27 ENCOUNTER — Ambulatory Visit: Payer: Medicare Other | Admitting: Gastroenterology

## 2019-09-27 ENCOUNTER — Other Ambulatory Visit: Payer: Medicare Other | Admitting: Nurse Practitioner

## 2019-10-03 ENCOUNTER — Other Ambulatory Visit: Payer: Self-pay | Admitting: Family Medicine

## 2019-10-03 ENCOUNTER — Other Ambulatory Visit: Payer: Self-pay | Admitting: Cardiovascular Disease

## 2019-10-03 DIAGNOSIS — M62838 Other muscle spasm: Secondary | ICD-10-CM

## 2019-10-03 DIAGNOSIS — I959 Hypotension, unspecified: Secondary | ICD-10-CM

## 2019-10-03 DIAGNOSIS — G8929 Other chronic pain: Secondary | ICD-10-CM

## 2019-10-03 MED ORDER — MIDODRINE HCL 5 MG PO TABS: 5.0000 mg | ORAL_TABLET | Freq: Three times a day (TID) | ORAL | 3 refills | Status: DC

## 2019-10-03 MED ORDER — FUROSEMIDE 40 MG PO TABS: 20.0000 mg | ORAL_TABLET | Freq: Every day | ORAL | 3 refills | Status: DC

## 2019-10-03 MED ORDER — DIGOXIN 125 MCG PO TABS: 125.0000 ug | ORAL_TABLET | Freq: Every day | ORAL | 3 refills | Status: DC

## 2019-10-03 NOTE — Telephone Encounter (Signed)
Lmovm to verify how pt is taking Midodrine.

## 2019-10-03 NOTE — Telephone Encounter (Signed)
Verified with pt: Pt is taking Midodrine 5 mg TID as prescribed. Pt is taking Digoxin 1.25 mg tablet qd. Pt mentioned she is needing refills b/c primary doesn't want to fill meds for her. Please advise if ok to refill for 90 days?

## 2019-10-03 NOTE — Telephone Encounter (Signed)
*  STAT* If patient is at the pharmacy, call can be transferred to refill team.   1. Which medications need to be refilled? (please list name of each medication and dose if known)  Digoxin 0.125 MG 1 tablet daily Midodrine 5 MG 1 tablet daily   2. Which pharmacy/location (including street and city if local pharmacy) is medication to be sent to? [facility] on [address]   3. Do they need a 30 day or 90 day supply? 90 day

## 2019-10-04 ENCOUNTER — Other Ambulatory Visit: Payer: Medicare Other | Admitting: Nurse Practitioner

## 2019-10-05 ENCOUNTER — Telehealth: Payer: Self-pay | Admitting: Emergency Medicine

## 2019-10-05 NOTE — Telephone Encounter (Signed)
Patient called and stated that she needed refills on Gabapentin and muscle relaxer. That she was out. I informed patient that she received #270 for both on 12/14. I called [facility] and they stated that she was given #270 on 1/31 of Gabapentin and #270 of muscle relaxer on 12/15. I explain to patient that she is not due for any additional refills until March. Patient stated she do not know what has happen to her medication.

## 2019-10-05 NOTE — Telephone Encounter (Signed)
Copied from [place] [id number]. Topic: General - Other >> Oct 04, 2019  2:06 PM Rainey [place]ines A wrote: Patient would like to speak with nurse in regards to medication request being denied due to refill being too soon. Patient would like to know when she can get her medication. Please advise

## 2019-10-05 NOTE — Telephone Encounter (Signed)
I will not refill early - she was given 90 day supplies of both medications as you have reviewed in your notes already.

## 2019-10-05 NOTE — Telephone Encounter (Signed)
Patient understand. She stated she don'tknow what happen

## 2019-10-11 ENCOUNTER — Other Ambulatory Visit: Payer: Self-pay

## 2019-10-11 ENCOUNTER — Other Ambulatory Visit: Payer: Medicare Other | Admitting: Nurse Practitioner

## 2019-10-11 ENCOUNTER — Telehealth: Payer: Self-pay | Admitting: Nurse Practitioner

## 2019-10-11 NOTE — Telephone Encounter (Signed)
I called Anita Reed for scheduled telemedicine palliative care f/u visit, no answer, message left with contact information

## 2019-10-16 ENCOUNTER — Telehealth: Payer: Self-pay | Admitting: Nurse Practitioner

## 2019-10-16 NOTE — Telephone Encounter (Signed)
I called Anita Reed to reschedule f/u pc visit, no answer, message left

## 2019-10-18 ENCOUNTER — Telehealth: Payer: Self-pay | Admitting: Nurse Practitioner

## 2019-10-18 NOTE — Telephone Encounter (Signed)
I called Ms. Seiber to reschedule f/u PC telemedicine visit, no answer, message left with contact information

## 2019-10-28 NOTE — Progress Notes (Signed)
Date:  10/29/2019   ID:  Anita Reed, DOB [birth year], MRN [id number]  Patient Location:  [address] [place] [address]   Provider location:   [facility] ([place] [place] office  PCP:  Hubbard Hartshorn, FNP  Cardiologist:  Arvid Right [facility]   Chief Complaint  Patient presents with  . office visit    3 month F/U-Patient reports palpitations and SOB with exertion or when lying down at night; Meds verbally reviewed with patient.    History of Present Illness:    Anita Reed is a 59 y.o. female  past medical history of  alcohol abuse, cirrhosis, portal hypertension, varices, medication noncompliance, hepatitis C , atrial flutter on EKG May 2020 presenting to the hospital with weakness, chills shortness of breath, melena Abdomen distended consistent with ascites Hemoglobin of 4 requiring transfusion EGD performed showing severe portal hypertensive gastropathy active GI bleed Clipping performed She presents for f/u of her atrial flutter, fibrillation  Recently seen in the hospital December 2020 for ascites, alcohol cirrhosis, portal venous hypertension Hospital records reviewed in detail Had therapeutic paracentesis, TIPS performed, ammonia level 54 Was hypotensive at the time systolic pressure 84  Has lasix 40 mg takes QOD Has leg swelling/edema Drinks water, sunkist  Smokes  Asking for valium "nerves", sleep " Nobody will give it to me"  EKG personally reviewed by myself on todays visit Shows atrial flutter 120 bpm  The past medical history reviewed Hx of  upper GI bleed atrial fibrillation atrial flutter Previous EKGs reviewed showing atrial flutter rate 120s  She received 2 units packed red blood cells in the hospital for anemia In the setting of GI bleed, hemoglobin dropping to 8.6  EKG shows atrial flutter Jan 18, 2019, EKG on recent hospital admission showing atrial fibrillation, this broke back to flutter  Still drinking some alcohol,  occasional beer or wine    Prior CV studies:   The following studies were reviewed today:    Past Medical History:  Diagnosis Date  . Anemia   . Atrial flutter ([place])   . Cancer [facility])    lymphoma 11 yrs. ago  . Cirrhosis ([place])   . Esophageal varices ([place])   . Hepatitis    hep C-resolved  . History of hiatal hernia   . Hypertension   . Pneumonia   . Portal hypertension (HCC)    Past Surgical History:  Procedure Laterality Date  . ABDOMINAL HYSTERECTOMY    . APPENDECTOMY    . COLONOSCOPY N/A 06/03/2019   Procedure: COLONOSCOPY;  Surgeon: Toledo, Benay Pike, MD;  Location: [facility] ENDOSCOPY;  Service: Gastroenterology;  Laterality: N/A;  . ESOPHAGOGASTRODUODENOSCOPY N/A 01/29/2019   Procedure: ESOPHAGOGASTRODUODENOSCOPY (EGD);  Surgeon: [name], MD;  Location: [facility] ENDOSCOPY;  Service: Gastroenterology;  Laterality: N/A;  . ESOPHAGOGASTRODUODENOSCOPY N/A 06/01/2019   Procedure: ESOPHAGOGASTRODUODENOSCOPY (EGD);  Surgeon: Toledo, Benay Pike, MD;  Location: [facility] ENDOSCOPY;  Service: Gastroenterology;  Laterality: N/A;  . IR PARACENTESIS  08/27/2019  . IR RADIOLOGIST EVAL & MGMT  08/08/2019  . IR TIPS  08/27/2019  . RADIOLOGY WITH ANESTHESIA N/A 08/27/2019   Procedure: IR WITH ANESTHESIA TIPS PROCEDURE;  Surgeon: [name], MD;  Location: [place];  Service: Radiology;  Laterality: N/A;  . TONSILLECTOMY       Allergies:   Patient has no known allergies.   Social History   Tobacco Use  . Smoking status: Current Every Day Smoker    Packs/day: 1.00    Years:  35.00    Pack years: 35.00    Types: Cigarettes  . Smokeless tobacco: Never Used  Substance Use Topics  . Alcohol use: Not Currently  . Drug use: Never     Current Outpatient Medications on File Prior to Visit  Medication Sig Dispense Refill  . digoxin (LANOXIN) 0.125 MG tablet Take 1 tablet (125 mcg total) by mouth daily. 30 tablet 3  . FLUoxetine (PROZAC) 20 MG tablet Take 1 tablet (20 mg total) by mouth  daily. 30 tablet 3  . furosemide (LASIX) 40 MG tablet Take 0.5 tablets (20 mg total) by mouth daily. 15 tablet 3  . gabapentin (NEURONTIN) 400 MG capsule Take 1 capsule (400 mg total) by mouth 3 (three) times daily. 270 capsule 1  . lactulose (CHRONULAC) 10 GM/15ML solution Take 45 mLs (30 g total) by mouth 3 (three) times daily. 236 mL 2  . midodrine (PROAMATINE) 5 MG tablet Take 1 tablet (5 mg total) by mouth 3 (three) times daily with meals. 90 tablet 3  . thiamine 100 MG tablet Take 100 mg by mouth daily. Vitamin B-1    . tiZANidine (ZANAFLEX) 4 MG capsule Take 1 capsule (4 mg total) by mouth 3 (three) times daily as needed for muscle spasms. 270 capsule 0   No current facility-administered medications on file prior to visit.     Family Hx: The patient's family history includes Cancer in her father and mother.  ROS:   Please see the history of present illness.    Review of Systems  Constitutional: Negative.   HENT: Negative.   Respiratory: Positive for shortness of breath.   Cardiovascular: Positive for leg swelling.  Gastrointestinal: Negative.   Musculoskeletal: Negative.   Neurological: Negative.   Psychiatric/Behavioral: Negative.   All other systems reviewed and are negative.     Labs/Other Tests and Data Reviewed:    Recent Labs: 01/18/2019: B Natriuretic Peptide 771.0 06/23/2019: TSH 1.446 07/30/2019: Magnesium 1.7 08/27/2019: ALT 27; BUN 5; Creatinine, Ser 0.58; Hemoglobin 13.7; Platelets 271; Potassium 4.3; Sodium 137   Recent Lipid Panel No results found for: CHOL, TRIG, HDL, CHOLHDL, LDLCALC, LDLDIRECT  Wt Readings from Last 3 Encounters:  10/29/19 143 lb (64.9 kg)  09/24/19 138 lb 12.8 oz (63 kg)  09/10/19 134 lb 8 oz (61 kg)     Exam:    Vital Signs: Vital signs may also be detailed in the HPI BP 130/80 (BP Location: Left Arm, Patient Position: Sitting, Cuff Size: Normal)   Pulse (!) 121   Ht 5\' 9"  (1.753 m)   Wt 143 lb (64.9 kg)   SpO2 98%   BMI  21.12 kg/m   Wt Readings from Last 3 Encounters:  10/29/19 143 lb (64.9 kg)  09/24/19 138 lb 12.8 oz (63 kg)  09/10/19 134 lb 8 oz (61 kg)   Temp Readings from Last 3 Encounters:  09/24/19 98.1 F (36.7 C)  09/10/19 97.9 F (36.6 C) (Temporal)  08/28/19 98.4 F (36.9 C) (Oral)   BP Readings from Last 3 Encounters:  10/29/19 130/80  09/24/19 (!) 105/54  09/10/19 102/64   Pulse Readings from Last 3 Encounters:  10/29/19 (!) 121  09/24/19 90  09/10/19 71      ASSESSMENT & PLAN:    Persistent atrial fibrillation (HCC)  Typical atrial flutter (HCC)  Alcoholic cirrhosis of liver with ascites (HCC)  Portal hypertension (HCC)  Bleeding esophageal varices, unspecified esophageal varices type ([place])  Gastrointestinal hemorrhage, unspecified gastrointestinal hemorrhage type  Atrial flutter  with RVR First documented on EKG 01/18/2019 Difficult to control rate given hypotension,  Systolic pressure today [id number] on my check Previously on nadolol, appears this has been held Tolerating low-dose digoxin Not a good candidate for anticoagulation given GI bleeding, varices In the past has been asymptomatic from her flutter -We will add metoprolol succinate 25 mg daily  Atrial fibrillation with RVR Prior history of fib flutter Plan as above  Leg edema Likely multifactorial, recommended Lasix daily She is only taking Lasix every other day secondary to bladder spasm  Anorexia "phobia of being fat" Weight continues to run low contributing to hypotension  Alcoholic cirrhosis Alcohol cessation recommended Recent paracentesis and TIPS procedure, discussed with her Feels she is doing much better   Total encounter time more than 25 minutes  Greater than 50% was spent in counseling and coordination of care with the patient  Follow-up 6 months  Signed, Ida Rogue, MD  10/29/2019 2:20 PM    [place] Office [address] [place] [address],  [place], [place] [address]

## 2019-10-29 ENCOUNTER — Other Ambulatory Visit: Payer: Self-pay

## 2019-10-29 ENCOUNTER — Encounter: Payer: Self-pay | Admitting: Cardiovascular Disease

## 2019-10-29 ENCOUNTER — Ambulatory Visit (INDEPENDENT_AMBULATORY_CARE_PROVIDER_SITE_OTHER): Payer: Medicare Other | Admitting: Cardiovascular Disease

## 2019-10-29 VITALS — BP 130/80 | HR 121 | Ht 69.0 in | Wt 143.0 lb

## 2019-10-29 DIAGNOSIS — I8501 Esophageal varices with bleeding: Secondary | ICD-10-CM

## 2019-10-29 DIAGNOSIS — I483 Typical atrial flutter: Secondary | ICD-10-CM

## 2019-10-29 DIAGNOSIS — I4819 Other persistent atrial fibrillation: Secondary | ICD-10-CM

## 2019-10-29 DIAGNOSIS — K766 Portal hypertension: Secondary | ICD-10-CM

## 2019-10-29 DIAGNOSIS — K7031 Alcoholic cirrhosis of liver with ascites: Secondary | ICD-10-CM

## 2019-10-29 DIAGNOSIS — K922 Gastrointestinal hemorrhage, unspecified: Secondary | ICD-10-CM

## 2019-10-29 MED ORDER — METOPROLOL SUCCINATE ER 25 MG PO TB24: 25.0000 mg | ORAL_TABLET | Freq: Every day | ORAL | 3 refills | Status: DC

## 2019-10-29 NOTE — Patient Instructions (Addendum)
Medication Instructions:  Please start metoprolol succinate 25 mg daily Lasix daily for leg swelling  If you need a refill on your cardiac medications before your next appointment, please call your pharmacy.    Lab work: No new labs needed   If you have labs (blood work) drawn today and your tests are completely normal, you will receive your results only by: [name] MyChart Message (if you have MyChart) OR . A paper copy in the mail If you have any lab test that is abnormal or we need to change your treatment, we will call you to review the results.   Testing/Procedures: No new testing needed   Follow-Up: At [facility], you and your health needs are our priority.  As part of our continuing mission to provide you with exceptional heart care, we have created designated Provider Care Teams.  These Care Teams include your primary Cardiologist (physician) and Advanced Practice Providers (APPs -  Physician Assistants and Nurse Practitioners) who all work together to provide you with the care you need, when you need it.  . You will need a follow up appointment in 6 months   . Providers on your designated Care Team:   . Murray Hodgkins, NP . Christell Faith, PA-C . Marrianne Mood, PA-C  Any Other Special Instructions Will Be Listed Below (If Applicable).  For educational health videos Log in to : [email or web address] Or : [email or web address], password : [email or web address]

## 2019-10-30 ENCOUNTER — Telehealth: Payer: Self-pay | Admitting: Nurse Practitioner

## 2019-10-30 NOTE — Telephone Encounter (Signed)
I called in attempt to reschedule Palliative care f/u visit by telemedicine, no answer, message left with contact information

## 2019-11-07 ENCOUNTER — Telehealth: Payer: Self-pay | Admitting: Nurse Practitioner

## 2019-11-07 NOTE — Telephone Encounter (Signed)
Called patient's listed cell number to reschedule the 10/11/19 Palliative f/u visit (no show), no answer - left message requesting a call back to let us know if she wishes to continue with Palliative services or not, left my name and contact number.

## 2019-11-09 ENCOUNTER — Other Ambulatory Visit: Payer: Self-pay

## 2019-11-09 ENCOUNTER — Ambulatory Visit
Admission: RE | Admit: 2019-11-09 | Discharge: 2019-11-09 | Disposition: A | Discharge status: Home / Self Care | Payer: Medicare Other | Source: Ambulatory Visit | Attending: Interventional Radiology | Admitting: Interventional Radiology

## 2019-11-09 DIAGNOSIS — K7031 Alcoholic cirrhosis of liver with ascites: Secondary | ICD-10-CM | POA: Diagnosis not present

## 2019-11-09 DIAGNOSIS — K7460 Unspecified cirrhosis of liver: Secondary | ICD-10-CM | POA: Diagnosis not present

## 2019-11-09 IMAGING — US US HEPATIC LIVER DOPPLER
1 series · 14 of 25 positions shown · non-contrast
Comparison: [DATE], [DATE]

CLINICAL DATA: Hepatic cirrhosis, status post tips [DATE]

EXAM:
DUPLEX ULTRASOUND OF LIVER AND TIPS SHUNT
TECHNIQUE: Color and duplex Doppler ultrasound was performed to evaluate the
hepatic in-flow and out-flow vessels.

[Series 1: us liver doppler · 14 of 73 slices shown]
[im 1/73]
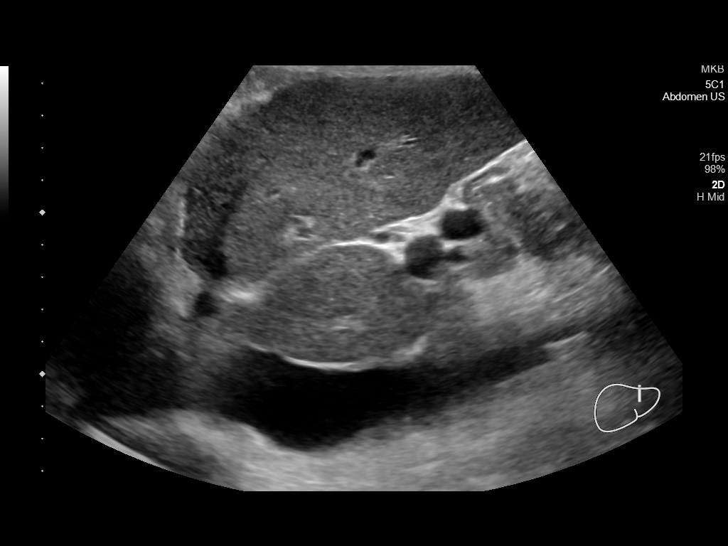
[im 7/73]
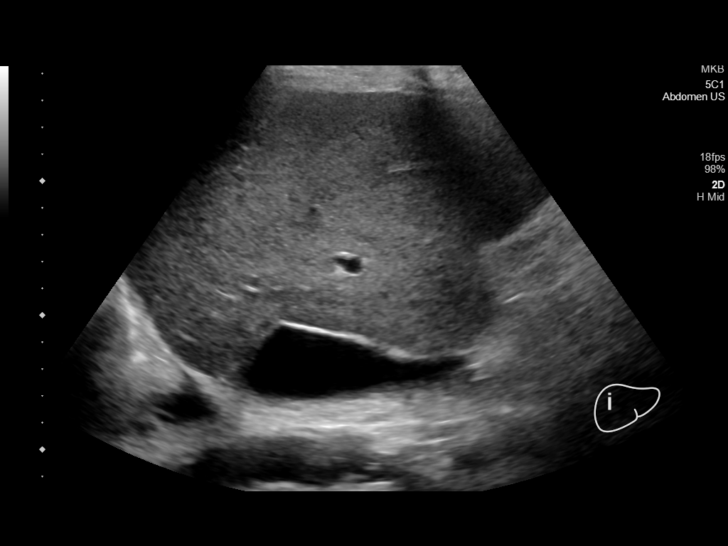
[im 13/73]
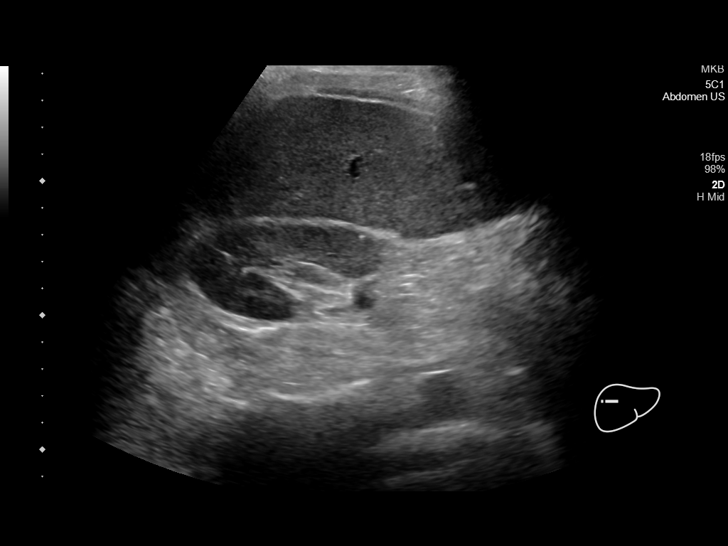
[im 19/73]
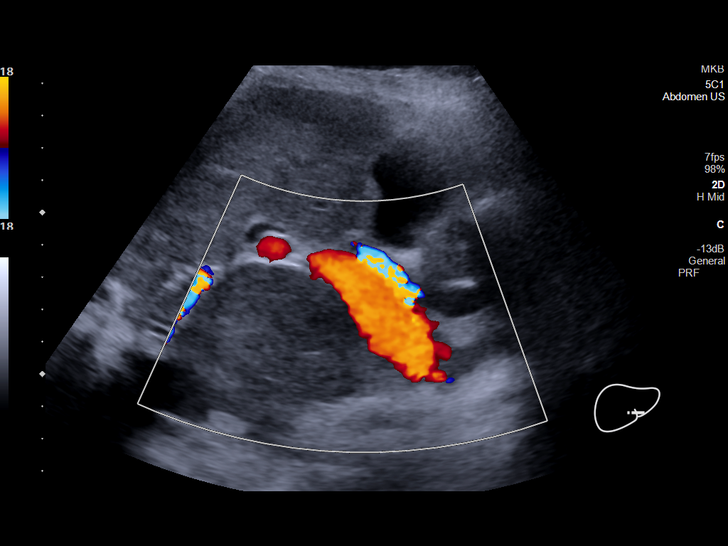
[im 25/73]
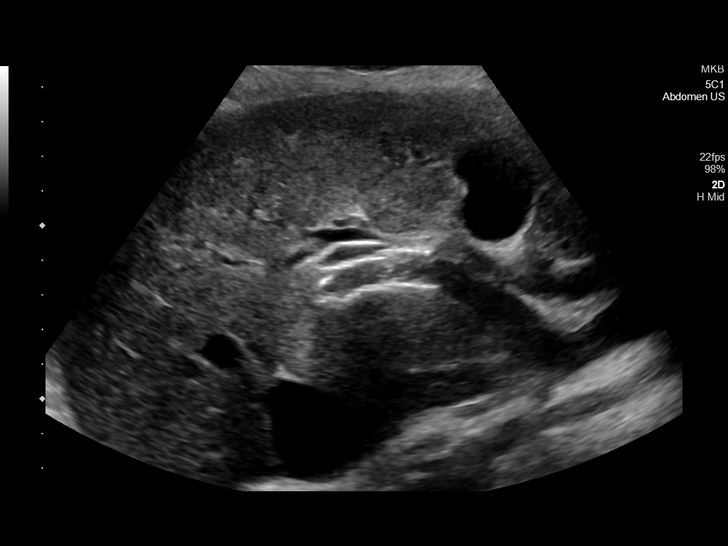
[im 28/73]
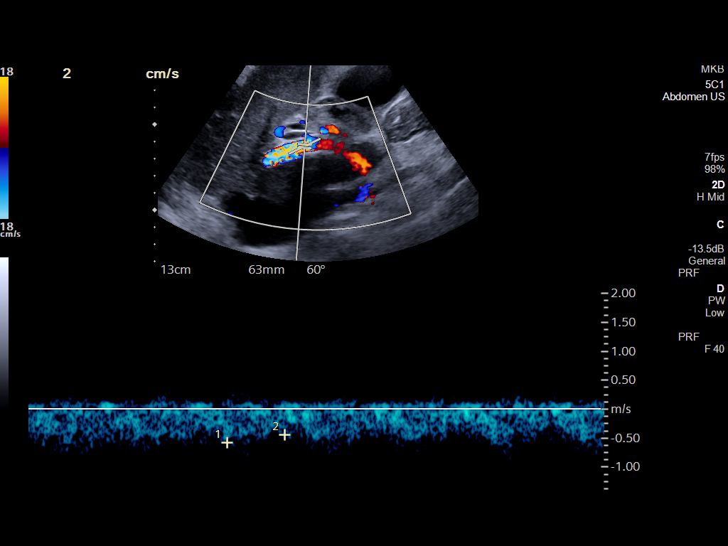
[im 34/73]
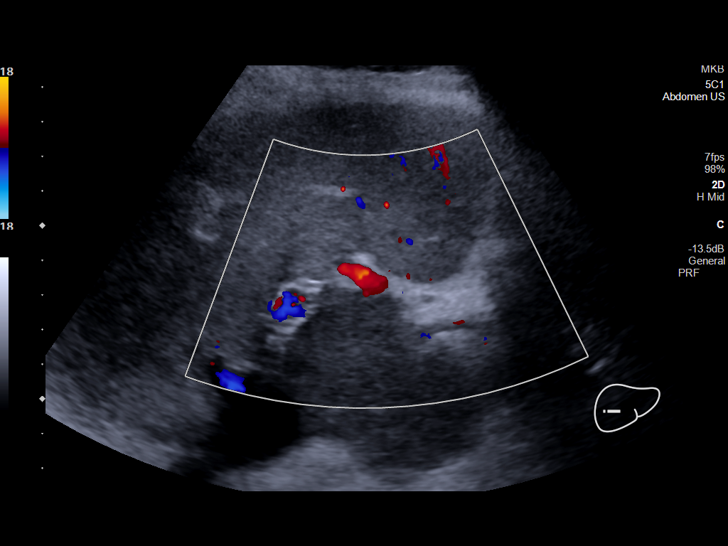
[im 40/73]
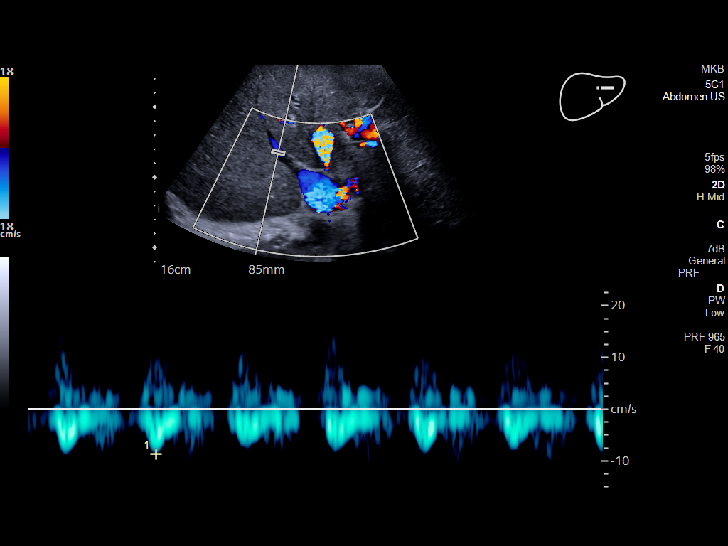
[im 46/73]
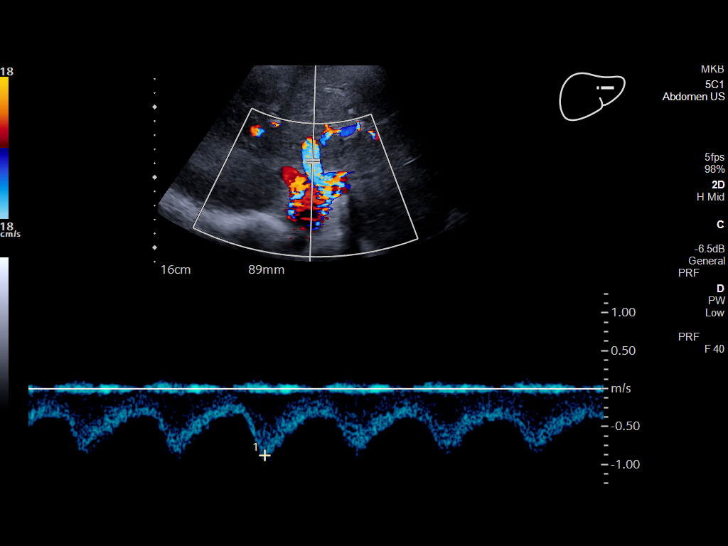
[im 49/73]
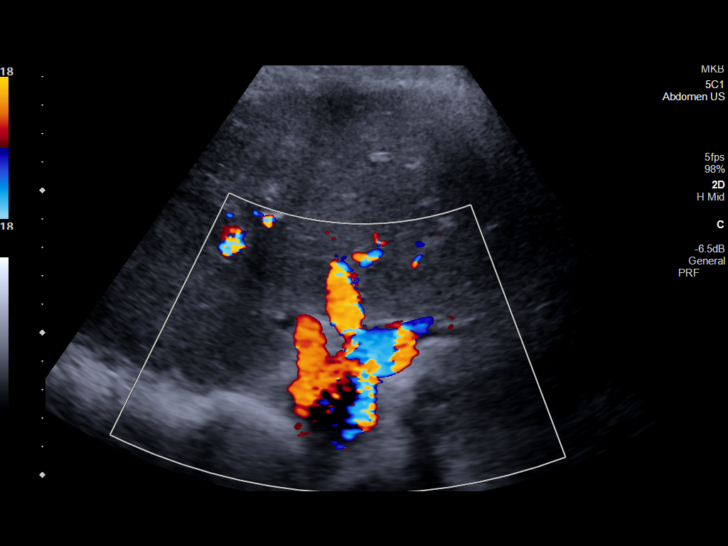
[im 55/73]
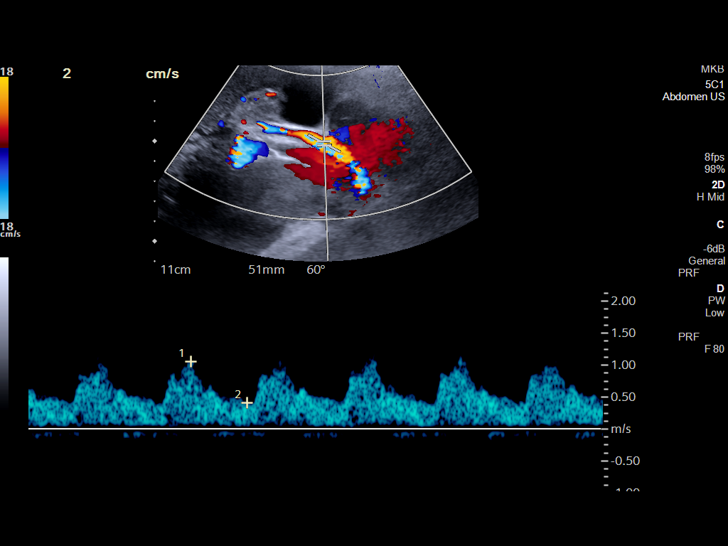
[im 61/73]
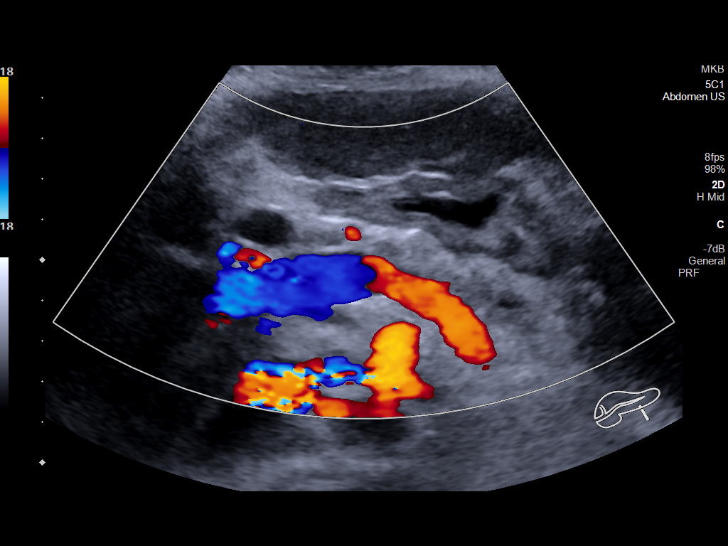
[im 67/73]
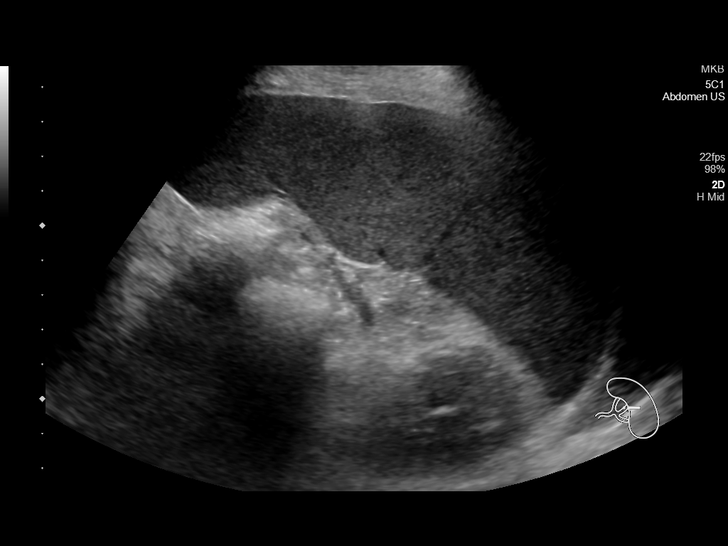
[im 73/73]
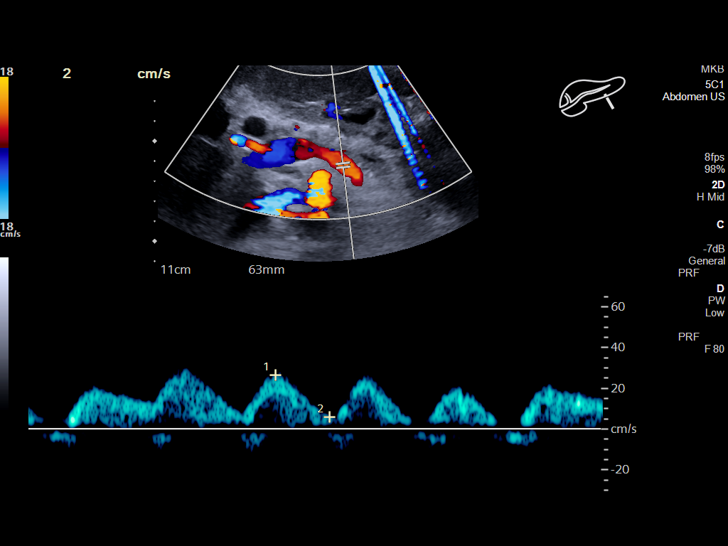

[14 of 25 positions shown; findings below may reference images not displayed]

FINDINGS: TIPS Shunt Velocities

Proximal:  34 cm/sec

Mid:  57

Distal:  150 cm/sec

Portal Vein Velocities

Main:  30 cm/sec

Right:  39 cm/sec

Left:  34 cm/sec

Hepatic Vein Velocities

Right:  18 cm/sec

Middle:  80 cm/sec

Left:  30 cm/sec

Hepatic Artery Velocity: 105 cm per second

Splenic Vein Velocity: 20 centimeters/seconds

Varices:  Not visualized

Ascites: None.

Other findings: The TIPS extends from the portal vein into the
middle hepatic vein/IVC junction. The TIPS appears patent with
normal directional flow. No significant velocity abnormality. Portal
and hepatic veins also remain patent.

Spleen is enlarged measuring 12 x 5 x 14 cm, splenic volume 441 cc.

Right pleural effusion noted.
IMPRESSION: Widely patent TIPS.

Compared to the prior studies, ascites has resolved.

Small right pleural effusion

## 2019-11-13 ENCOUNTER — Telehealth: Payer: Self-pay | Admitting: Nurse Practitioner

## 2019-11-13 NOTE — Telephone Encounter (Signed)
Called patient to reschedule the Palliative f/u visit that was scheduled for 10/11/19 (no show), no answer - left message with reason for call along with my name and contact number.  I also called patient's husband and spoke with him to let him know that we have been trying to reach her to reschedule a visit and husband stated that he doesn't get into her business when it comes to things like this.  I told him to please mention to her that we have been trying to reach her and to let her know that if she doesn't want to continue with Palliative services to please give [place] a call to let us know.  He agreed to relay message to patient.

## 2019-11-14 ENCOUNTER — Other Ambulatory Visit: Payer: Self-pay

## 2019-11-14 ENCOUNTER — Encounter: Payer: Self-pay | Admitting: *Deleted

## 2019-11-14 ENCOUNTER — Ambulatory Visit
Admission: RE | Admit: 2019-11-14 | Discharge: 2019-11-14 | Disposition: A | Discharge status: Home / Self Care | Payer: Medicare Other | Source: Ambulatory Visit | Attending: Radiology | Admitting: Radiology

## 2019-11-14 DIAGNOSIS — K7031 Alcoholic cirrhosis of liver with ascites: Secondary | ICD-10-CM

## 2019-11-14 DIAGNOSIS — Z9889 Other specified postprocedural states: Secondary | ICD-10-CM | POA: Diagnosis not present

## 2019-11-14 DIAGNOSIS — K7460 Unspecified cirrhosis of liver: Secondary | ICD-10-CM | POA: Diagnosis not present

## 2019-11-14 HISTORY — PX: IR RADIOLOGIST EVAL & MGMT: IMG5224

## 2019-11-14 NOTE — Progress Notes (Signed)
Patient ID: Anita Reed, female   DOB: 1960-12-20, 59 y.o.   MRN: [id number]        Chief Complaint: Patient was consulted remotely today ([place]) for  at the request of [name].    Referring Physician(s): [name]    History of Present Illness: Anita Reed is a 60 y.o. female with history of hepatic cirrhosis, Hypertension, remote history of lymphoma treated 11 years ago.  History of chronic hep C, treated in the past, with undetectable viral load. 01/18/2019 presented to ED with symptomatic abdominal ascites, attributed to hepatic cirrhosis 01/28/2019 presented with shortness of breath, weakness, black stools.  Endoscopy revealed portal gastropathy, Dieulafoy  lesion which was clipped. 06/01/2019 presented to ED with melena and hematemesis.  EGD demonstrated large esophageal varices and severe portal gastropathy.  Also diagnosed with C. difficile colitis   06/22/2019 admitted for abdominal pain and sepsis 07/14/2019 admitted from ED with bright red blood per rectum, attributed to recurrent C. difficile colitis which resolved on vancomycin  She had required 9 ultrasound-guided paracentesis procedures since May, becoming more frequent and generally large volume up to 5 L.  She did not  endorse any history of  encephalopathy.  She had recently stopped ethanol abuse. [id number] uncomplicated TIPS creation and paracentesis 08/28/2019 discharged home, doing well She has not needed recurrent paracentesis since TIPS creation.  Her abdominal distention has improved.  She is on a low intermittent lactulose dose.  She is not endorsing any symptoms of hepatic encephalopathy.  She consulted with liver transplant team and was reportedly told  not currently candidate because of an adequate disease severity.   Past Medical History:  Diagnosis Date  . Anemia   . Atrial flutter ([name])   . Cancer [facility])    lymphoma 11 yrs. ago  . Cirrhosis ([name])   . Esophageal varices ([place])     . Hepatitis    hep C-resolved  . History of hiatal hernia   . Hypertension   . Pneumonia   . Portal hypertension ([facility])     Past Surgical History:  Procedure Laterality Date  . ABDOMINAL HYSTERECTOMY    . APPENDECTOMY    . COLONOSCOPY N/A 06/03/2019   Procedure: COLONOSCOPY;  Surgeon: Toledo, Benay Pike, MD;  Location: [facility] ENDOSCOPY;  Service: Gastroenterology;  Laterality: N/A;  . ESOPHAGOGASTRODUODENOSCOPY N/A 01/29/2019   Procedure: ESOPHAGOGASTRODUODENOSCOPY (EGD);  Surgeon: [name], MD;  Location: [facility] ENDOSCOPY;  Service: Gastroenterology;  Laterality: N/A;  . ESOPHAGOGASTRODUODENOSCOPY N/A 06/01/2019   Procedure: ESOPHAGOGASTRODUODENOSCOPY (EGD);  Surgeon: Toledo, Benay Pike, MD;  Location: [facility] ENDOSCOPY;  Service: Gastroenterology;  Laterality: N/A;  . IR PARACENTESIS  08/27/2019  . IR RADIOLOGIST EVAL & MGMT  08/08/2019  . IR TIPS  08/27/2019  . RADIOLOGY WITH ANESTHESIA N/A 08/27/2019   Procedure: IR WITH ANESTHESIA TIPS PROCEDURE;  Surgeon: [name], MD;  Location: [place];  Service: Radiology;  Laterality: N/A;  . TONSILLECTOMY      Allergies: Patient has no known allergies.  Medications: Prior to Admission medications   Medication Sig Start Date End Date Taking? Authorizing Provider  digoxin (LANOXIN) 0.125 MG tablet Take 1 tablet (125 mcg total) by mouth daily. 10/03/19   Minna Merritts, MD  FLUoxetine (PROZAC) 20 MG tablet Take 1 tablet (20 mg total) by mouth daily. 09/10/19   Hubbard Hartshorn, FNP  furosemide (LASIX) 40 MG tablet Take 0.5 tablets (20 mg total) by mouth daily. 10/03/19   Minna Merritts, MD  gabapentin (NEURONTIN) 400 MG  capsule Take 1 capsule (400 mg total) by mouth 3 (three) times daily. 08/13/19   Hubbard Hartshorn, FNP  lactulose (CHRONULAC) 10 GM/15ML solution Take 45 mLs (30 g total) by mouth 3 (three) times daily. 08/28/19   [name], NP  metoprolol succinate (TOPROL-XL) 25 MG 24 hr tablet Take 1 tablet (25 mg total) by  mouth daily. Take with or immediately following a meal. 10/29/19   Gollan, Kathlene November, MD  midodrine (PROAMATINE) 5 MG tablet Take 1 tablet (5 mg total) by mouth 3 (three) times daily with meals. 10/03/19   Minna Merritts, MD  thiamine 100 MG tablet Take 100 mg by mouth daily. Vitamin B-1    [provider]  tiZANidine (ZANAFLEX) 4 MG capsule Take 1 capsule (4 mg total) by mouth 3 (three) times daily as needed for muscle spasms. 08/13/19   Hubbard Hartshorn, FNP     Family History  Problem Relation Age of Onset  . Cancer Mother   . Cancer Father     Social History   Socioeconomic History  . Marital status: Married    Spouse name: [name]  . Number of children: 1  . Years of education: 50  . Highest education level: 9th grade  Occupational History  . Occupation: disabled  Tobacco Use  . Smoking status: Current Every Day Smoker    Packs/day: 1.00    Years: 35.00    Pack years: 35.00    Types: Cigarettes  . Smokeless tobacco: Never Used  Substance and Sexual Activity  . Alcohol use: Not Currently  . Drug use: Never  . Sexual activity: Not Currently  Other Topics Concern  . Not on file  Social History Narrative  . Not on file   Social Determinants of Health   Financial Resource Strain: Medium Risk  . Difficulty of Paying Living Expenses: Somewhat hard  Food Insecurity: Food Insecurity Present  . Worried About Charity fundraiser in the Last Year: Sometimes true  . Ran Out of Food in the Last Year: Sometimes true  Transportation Needs: No Transportation Needs  . Lack of Transportation (Medical): No  . Lack of Transportation (Non-Medical): No  Physical Activity: Inactive  . Days of Exercise per Week: 0 days  . Minutes of Exercise per Session: 0 min  Stress: No Stress Concern Present  . Feeling of Stress : Only a little  Social Connections: Moderately Isolated  . Frequency of Communication with Friends and Family: Once a week  . Frequency of Social Gatherings with  Friends and Family: Never  . Attends Religious Services: Never  . Active Member of Clubs or Organizations: No  . Attends Archivist Meetings: Never  . Marital Status: Married    ECOG Status: 1 - Symptomatic but completely ambulatory  Review of Systems  Review of Systems: A 12 point ROS discussed and pertinent positives are indicated in the HPI above.  All other systems are negative.  Physical Exam No direct physical exam was performed (except for noted visual exam findings with Video Visits).      Vital Signs: There were no vitals taken for this visit.  Imaging: US LIVER DOPPLER  Result Date: 11/11/2019 CLINICAL DATA:  Hepatic cirrhosis, status post tips 08/27/2019 EXAM: DUPLEX ULTRASOUND OF LIVER AND TIPS SHUNT TECHNIQUE: Color and duplex Doppler ultrasound was performed to evaluate the hepatic in-flow and out-flow vessels. COMPARISON:  08/27/2019, 07/14/2019 FINDINGS: TIPS Shunt Velocities Proximal:  34 cm/sec Mid:  57 Distal:  150  cm/sec Portal Vein Velocities Main:  30 cm/sec Right:  39 cm/sec Left:  34 cm/sec Hepatic Vein Velocities Right:  18 cm/sec Middle:  80 cm/sec Left:  30 cm/sec Hepatic Artery Velocity: 105 cm per second Splenic Vein Velocity: 20 centimeters/seconds Varices:  Not visualized Ascites: None. Other findings: The TIPS extends from the portal vein into the middle hepatic vein/IVC junction. The TIPS appears patent with normal directional flow. No significant velocity abnormality. Portal and hepatic veins also remain patent. Spleen is enlarged measuring 12 x 5 x 14 cm, splenic volume 441 cc. Right pleural effusion noted. IMPRESSION: Widely patent TIPS. Compared to the prior studies, ascites has resolved. Small right pleural effusion Electronically Signed   By: Jerilynn Mages.  Shick M.D.   On: 11/11/2019 08:22    Labs:  CBC: Recent Labs    07/16/19 0629 07/17/19 0820 07/30/19 0000 08/27/19 0833  WBC 19.3* 11.7* 11.3* 14.9*  HGB 12.0 13.5 12.6 13.7  HCT 38.7 42.6  38.1 42.5  PLT 185 175 225 271    COAGS: Recent Labs    06/01/19 1251 06/01/19 1251 06/22/19 1600 06/23/19 0522 07/14/19 1754 08/27/19 0833  INR 1.4*   < > 1.2 1.3* 1.2 1.2  APTT 29  --  30  --  30  --    < > = values in this interval not displayed.    BMP: Recent Labs    07/16/19 0629 07/17/19 0820 07/30/19 0000 08/27/19 0833  NA 139 136 137 137  K 4.2 4.0 3.5 4.3  CL 105 105 103 100  CO2 27 22 20 26   GLUCOSE 96 86 132* 117*  BUN <5* 7 9 5*  CALCIUM 8.6* 8.7* 8.6 9.0  CREATININE 0.41* 0.49 0.33* 0.58  GFRNONAA >60 >60 123 >60  GFRAA >60 >60 143 >60    LIVER FUNCTION TESTS: Recent Labs    06/25/19 0418 06/25/19 0418 06/28/19 0321 07/14/19 1754 07/30/19 0000 08/27/19 0833  BILITOT 1.3*   < > 1.2 2.1*  2.1* 1.8* 1.8*  AST 36   < > 38 57*  57* 69* 75*  ALT 15   < > 16 20  20  30* 27  ALKPHOS 411*  --  402* 354*  361*  --  514*  PROT 4.5*   < > 4.5* 5.8*  5.8* 5.3* 6.0*  ALBUMIN 2.4*  --  2.4* 3.3*  3.3*  --  3.1*   < > = values in this interval not displayed.    TUMOR MARKERS: No results for input(s): AFPTM, CEA, CA199, CHROMGRNA in the last 8760 hours.  Assessment and Plan:  My impression is that Ms. Deross is doing well post TIPS creation.  She has not needed paracentesis since TIPS, which was the primary indication.  She has had no GI bleeding issues either. We discussed the importance of lactulose use to prevent hepatic encephalopathy.  We discussed early signs of hepatic encephalopathy.  She seemed to understand and had her questions answered. The hepatic vascular Doppler study looks good post procedure.  We will repeat this yearly. I encouraged her to keep in touch with the liver transplant team as cirrhosis is irreversible, only cured with transplant. She knows to call should she have interval questions or problems.  She will continue to follow-up with Dr. Marius Ditch.  Thank you for this interesting consult.  I greatly enjoyed meeting Tnya Denis and look forward to participating in their care.  A copy of this report was sent to the requesting provider on  this date.  Electronically Signed: Rickard Rhymes 11/14/2019, 3:44 PM   I spent a total of    15 Minutes in remote  clinical consultation, greater than 50% of which was counseling/coordinating care for cirrhosis, post TIPS.    Visit type: Audio only (telephone). Audio (no video) only due to patient's lack of internet/smartphone capability. Alternative for in-person consultation at [facility], [address], [place], [place]. This visit type was conducted due to national recommendations for restrictions regarding the COVID-19 Pandemic (e.g. social distancing).  This format is felt to be most appropriate for this patient at this time.  All issues noted in this document were discussed and addressed.

## 2019-11-26 ENCOUNTER — Ambulatory Visit: Payer: Medicare Other | Admitting: Gastroenterology

## 2019-11-27 ENCOUNTER — Telehealth: Payer: Self-pay | Admitting: Nurse Practitioner

## 2019-11-27 ENCOUNTER — Other Ambulatory Visit: Payer: Self-pay | Admitting: Family Medicine

## 2019-11-27 DIAGNOSIS — M62838 Other muscle spasm: Secondary | ICD-10-CM

## 2019-11-27 DIAGNOSIS — G8929 Other chronic pain: Secondary | ICD-10-CM

## 2019-11-27 DIAGNOSIS — I959 Hypotension, unspecified: Secondary | ICD-10-CM

## 2019-11-27 MED ORDER — GABAPENTIN 400 MG PO CAPS: 400.0000 mg | ORAL_CAPSULE | Freq: Three times a day (TID) | ORAL | 1 refills | Status: DC

## 2019-11-27 MED ORDER — TIZANIDINE HCL 4 MG PO CAPS: 4.0000 mg | ORAL_CAPSULE | Freq: Three times a day (TID) | ORAL | 0 refills | Status: DC | PRN

## 2019-11-27 NOTE — Telephone Encounter (Signed)
Patient needs refills of gabapentin and tizanidine for neuropathy. Patient states she has appointment with Dr. Roxan Hockey tomorrow morning.She would like something to help her tonight because she is in a lot of pain and very uncomfortable.

## 2019-11-27 NOTE — Telephone Encounter (Signed)
Ms. Schellin returned call, attempted to reschedule PC f/u visit. Ms. Barch endorses she would like to d/c PC services, she was doing okay and did not need anything else. Will d/c from [facility]

## 2019-11-27 NOTE — Telephone Encounter (Signed)
Pt has an appt in the morning but says that she is real severe pain and wanted to know if you all could call her in just one of her pills till in the morning. She said especially her [place]

## 2019-11-27 NOTE — Telephone Encounter (Signed)
lvm for scheduling °

## 2019-11-27 NOTE — Telephone Encounter (Signed)
Requested medication (s) are due for refill today: yes  Requested medication (s) are on the active medication list: yes  Last refill:  08/13/19  Future visit scheduled: no  Notes to clinic:  Medication not delegated to NT to refill   Requested Prescriptions  Pending Prescriptions Disp Refills   tiZANidine (ZANAFLEX) 4 MG capsule [Pharmacy Med Name: tiZANidine HCl 4 MG Oral Capsule] 270 capsule     Sig: TAKE 1 CAPSULE BY MOUTH THREE TIMES DAILY AS NEEDED FOR MUSCLE SPASM      Not Delegated - Cardiovascular:  Alpha-2 Agonists - tizanidine Failed - 11/27/2019  9:31 AM      Failed - This refill cannot be delegated      Passed - Valid encounter within last 6 months    Recent Outpatient Visits           2 months ago Atrial flutter, unspecified type [facility])   [place], FNP   3 months ago Muscle spasticity   [place], Astrid Divine, FNP   4 months ago Gastrointestinal hemorrhage, unspecified gastrointestinal hemorrhage type   [facility] [place] Hartshorn, FNP   5 months ago Gastrointestinal hemorrhage, unspecified gastrointestinal hemorrhage type   [facility] [place] Hartshorn, FNP   7 months ago Cervical cancer screening   [place], [place]               Refused Prescriptions Disp Refills   gabapentin (NEURONTIN) 400 MG capsule [Pharmacy Med Name: Gabapentin 400 MG Oral Capsule] 270 capsule     Sig: TAKE 1 [place]      Neurology: Anticonvulsants - gabapentin Passed - 11/27/2019  9:31 AM      Passed - Valid encounter within last 12 months    Recent Outpatient Visits           2 months ago Atrial flutter, unspecified type [facility])   [place], FNP   3 months ago Muscle spasticity   [place], FNP   4 months ago Gastrointestinal hemorrhage,  unspecified gastrointestinal hemorrhage type   [place], FNP   5 months ago Gastrointestinal hemorrhage, unspecified gastrointestinal hemorrhage type   [place], FNP   7 months ago Cervical cancer screening   [place], [place]

## 2019-11-27 NOTE — Telephone Encounter (Signed)
I called Mrs. Sinnett on her home phone [phone] and [phone] with no answer on either line, message left with contact information. I called Mr Gauthier, Mrs. Lauver's husband at [phone], discuss palliative care and will give message to Ms Devilla to return call to schedule appointment.

## 2019-11-27 NOTE — Progress Notes (Signed)
Name: Anita Reed   MRN: [id number]    DOB: 08-25-1961   Date:11/28/2019       Progress Note  Subjective  Chief Complaint  Chief Complaint  Patient presents with  . Follow-up  . Peripheral Neuropathy    Patient states she is doing much better after getting refills of gabapentin and tizanidine  . Hypotension  . Depression    PHQ 0    I connected with  Anita Reed on 11/28/19 at  8:40 AM EDT by telephone and verified that I am speaking with the correct person using two identifiers.  I discussed the limitations, risks, security and privacy concerns of performing an evaluation and management service by telephone and the availability of in person appointments. Staff also discussed with the patient that there may be a patient responsible charge related to this service. Patient Location: Home Provider Location: [facility] Additional Individuals present: none  I called the patient at approximately 9:10, and again a few minutes later, and she did not answer. I also tried her cell phone number in the chart, and without identifying who the message was to be left for on the voicemail, no message was left. I notified Anita Reed, who will try again shortly. Anita Reed was able to contact the patient at approximately 9:25, and a visit was done by phone. HPI  Anita Reed is a 59 y.o. female  past medical history of alcohol abuse, cirrhosis, portal hypertension, varices, medication noncompliance, hepatitis C , atrial flutter  Recently seen in the hospital December 2020 for ascites, alcohol cirrhosis, portal venous hypertension Had therapeutic paracentesis, TIPS performed, ammonia level 54  Last saw cardiology 10/29/2019 It was recommended starting metoprolol for her flutter, and increasing her Lasix to once daily That note was reviewed.  Last visit at [facility] with Anita Reed was 09/10/2019 with these issues addressed: Neuropathy, chronic pain, and muscle spasticity: She is prescribed to take gabapentin 400mg   TID.  She is also taking 4mg  Tizandidine TID for her pain and ongoing lower extremity muscle spasms (worse when her BLE edema worsens).On average takes tizanidine about twice a day, as she noted she does not always need it 3 times a day.  She did note that she needs these for her pain control, as she simply cannot function without that to help.  She requested a refill of these 2 medicines yesterday, as was in a lot of pain, and was hoping to have them to get some sleep at night.  After chart review, I felt it was reasonable to renew these and they were done.  She notes that it was very helpful and was appreciative that she could get that refill before last evening.  I did note if it is more problematic, we need to ask our pain management providers to help [place] as was entertained in the past, and not wanted with cost an issue. With patient's hepatic impairment, Anita Reed noted the need to proceed with great caution, and Anita Reed, the gastroenterologist was consulted about increasing the tizanidine dose in the past, and it was recommended not to increase that dose any further.  Decompensated Cirrhosis with ascitis and hx GI bleed- She had TIPS procedures 08/27/2019 and her abdominal swelling has been much improved.  She has ongoing BLE edema up to her knees.   Taking lactulose with discussion of changing to rifaximin on last GI follow-up although concern noted by patient about cost, as when she filled, she noted it was over thousand dollars, and did not fill.  Still lactulose use occasionally. Uses Lasix daily.  Overall doing much better since procedure.    Hypotension/Hx Aflutter - last follow up in early March 2021 with Anita Reed. That note was reviewed.  She noted she had a couple questions, but really needed to talk to the cardiologist about them, and she noted she could call and get her questions answered.  I noted she was not taking the medicine they had recommended (metoprolol) at their last visit, and I  did review the medicines with her today as well, reading each medicine out from the list, and she confirmed that she was taking these.  Also noted was not taking any other medicines than was on the list today.  Depression- She has been connected with palliative care while in the hospital. She was started on Prozac 10mg  while admitted, and felt it has been improving her mood, was increased to 20 mg daily by Anita Reed on the last visit. She more recently has expressed desire to discontinue palliative care. Her phq9 today was reviewed, all 0's.  She did note she went from taking no medicines, to them the cirrhosis diagnosis, which was a surprise for her as she stated she did not drink much alcohol, and has gone through a lot with her recent hospitalization as well.  She states she is dealing with it.   Patient Active Problem List   Diagnosis Date Noted  . Dysthymia 07/27/2019  . Other chronic pain 07/27/2019  . Muscle spasticity 07/27/2019  . C. difficile colitis   . Hypokalemia 07/15/2019  . Bandemia 07/15/2019  . Hypotension   . Goals of care, counseling/discussion   . Palliative care encounter   . Muscle cramping 06/22/2019  . Alcohol abuse 06/22/2019  . Sepsis ([place]) 06/22/2019  . GIB (gastrointestinal bleeding) 06/01/2019  . GI bleeding 06/01/2019  . History of lymphoma 02/26/2019  . Atrial flutter ([place]) 02/07/2019  . Alcoholic cirrhosis of liver with ascites ([place]) 02/07/2019  . Persistent atrial fibrillation ([place]) 02/07/2019  . Anemia 02/07/2019  . Anorexia 02/07/2019  . GI bleed 01/28/2019  . Ascites 01/18/2019  . Cancer [facility])     Past Surgical History:  Procedure Laterality Date  . ABDOMINAL HYSTERECTOMY    . APPENDECTOMY    . COLONOSCOPY N/A 06/03/2019   Procedure: COLONOSCOPY;  Surgeon: Toledo, Benay Pike, MD;  Location: [facility] ENDOSCOPY;  Service: Gastroenterology;  Laterality: N/A;  . ESOPHAGOGASTRODUODENOSCOPY N/A 01/29/2019   Procedure: ESOPHAGOGASTRODUODENOSCOPY (EGD);   Surgeon: [name], MD;  Location: [facility] ENDOSCOPY;  Service: Gastroenterology;  Laterality: N/A;  . ESOPHAGOGASTRODUODENOSCOPY N/A 06/01/2019   Procedure: ESOPHAGOGASTRODUODENOSCOPY (EGD);  Surgeon: Toledo, Benay Pike, MD;  Location: [facility] ENDOSCOPY;  Service: Gastroenterology;  Laterality: N/A;  . IR PARACENTESIS  08/27/2019  . IR RADIOLOGIST EVAL & MGMT  08/08/2019  . IR RADIOLOGIST EVAL & MGMT  11/14/2019  . IR TIPS  08/27/2019  . RADIOLOGY WITH ANESTHESIA N/A 08/27/2019   Procedure: IR WITH ANESTHESIA TIPS PROCEDURE;  Surgeon: [name], MD;  Location: [place];  Service: Radiology;  Laterality: N/A;  . TONSILLECTOMY      Family History  Problem Relation Age of Onset  . Cancer Mother   . Cancer Father     Social History   Tobacco Use  . Smoking status: Current Every Day Smoker    Packs/day: 1.00    Years: 35.00    Pack years: 35.00    Types: Cigarettes  . Smokeless tobacco: Never Used  Substance Use Topics  . Alcohol  use: Not Currently     Current Outpatient Medications:  .  digoxin (LANOXIN) 0.125 MG tablet, Take 1 tablet (125 mcg total) by mouth daily., Disp: 30 tablet, Rfl: 3 .  FLUoxetine (PROZAC) 20 MG tablet, Take 1 tablet (20 mg total) by mouth daily., Disp: 30 tablet, Rfl: 3 .  furosemide (LASIX) 40 MG tablet, Take 0.5 tablets (20 mg total) by mouth daily., Disp: 15 tablet, Rfl: 3 .  gabapentin (NEURONTIN) 400 MG capsule, Take 1 capsule (400 mg total) by mouth 3 (three) times daily., Disp: 270 capsule, Rfl: 1 .  lactulose (CHRONULAC) 10 GM/15ML solution, Take 45 mLs (30 g total) by mouth 3 (three) times daily., Disp: 236 mL, Rfl: 2 .  midodrine (PROAMATINE) 5 MG tablet, Take 1 tablet (5 mg total) by mouth 3 (three) times daily with meals., Disp: 90 tablet, Rfl: 3 .  tiZANidine (ZANAFLEX) 4 MG capsule, Take 1 capsule (4 mg total) by mouth 3 (three) times daily as needed for muscle spasms., Disp: 270 capsule, Rfl: 0  No Known Allergies  With staff  assistance, above reviewed with the patient today.  ROS: As per HPI, otherwise no specific complaints on a limited and focused Reed review   Objective  Virtual encounter, vitals not obtained.  Body mass index is 21.12 kg/m.  Physical Exam   Appears in NAD via conversation Breathing: No obvious respiratory distress. Speaking in complete sentences Neurological: Pt is alert and oriented, Speech is normal Psychiatric: Patient has a normal mood and affect Judgment and thought content normal.   No results found for this or any previous visit (from the past 72 hour(s)).  PHQ2/9: Depression screen [facility] 2/9 11/28/2019 09/10/2019 08/13/2019 07/27/2019 06/22/2019  Decreased Interest 0 0 0 1 0  Down, Depressed, Hopeless 0 0 0 1 0  PHQ - 2 Score 0 0 0 2 0  Altered sleeping 0 0 0 0 0  Tired, decreased energy 0 0 0 0 0  Change in appetite 0 0 0 0 0  Feeling bad or failure about yourself  0 0 0 0 0  Trouble concentrating 0 0 0 0 0  Moving slowly or fidgety/restless 0 0 0 0 0  Suicidal thoughts 0 0 0 - 0  PHQ-9 Score 0 0 0 2 0  Difficult doing work/chores Not difficult at all Not difficult at all Not difficult at all Not difficult at all Not difficult at all   PHQ-2/9 Result reviewed  Fall Risk: Fall Risk  11/28/2019 09/10/2019 08/13/2019 07/27/2019 06/22/2019  Falls in the past year? 0 0 0 0 0  Number falls in past yr: 0 0 0 0 0  Injury with Fall? 0 0 0 0 0  Follow up - Falls evaluation completed Falls evaluation completed Falls evaluation completed Falls evaluation completed     Assessment & Plan  1. Neuropathy, muscle spasticity Reviewed patient's chart at length yesterday when the request came for the medication refill.  Felt reasonable to refill that yesterday as requested.  She noted that was helpful. We will continue with the gabapentin, and the tizanidine up to 3 times a day as needed, and if not as helpful, and more pain concerns over time, will need to consult pain management  to help.  2. Other chronic pain As above  3. Alcoholic cirrhosis of liver with ascites ([place]) Continue to follow-up with gastroenterology  4. Atrial flutter, unspecified type [facility]) Continue follow-up with cardiology, and did recommend her calling the cardiologist office again as she  is not taking the medicine they recently recommended and she had questions about that.  5. Hypotension, unspecified hypotension type Continue follow-up with cardiology, remains on midodrine presently.  6. Major depressive disorder with single episode, in partial remission (HCC) Stable, reviewed PHQ-9 today which was good. Continue the fluoxetine   I discussed the assessment and treatment plan with the patient. The patient was provided an opportunity to ask questions and all were answered. The patient agreed with the plan and demonstrated an understanding of the instructions.   The patient was advised to call back or seek an in-person evaluation if the symptoms worsen or if the condition fails to improve as anticipated.  I provided 15 minutes of non-face-to-face time during this encounter that included discussing at length patient's sx/history, pertinent pmhx, medications, treatment and follow up plan. This time also included the necessary documentation, orders, and chart review.  Towanda Malkin, MD

## 2019-11-28 ENCOUNTER — Ambulatory Visit (INDEPENDENT_AMBULATORY_CARE_PROVIDER_SITE_OTHER): Payer: Medicare Other | Admitting: Internal Medicine

## 2019-11-28 ENCOUNTER — Encounter: Payer: Self-pay | Admitting: Internal Medicine

## 2019-11-28 VITALS — Ht 69.0 in | Wt 143.0 lb

## 2019-11-28 DIAGNOSIS — G629 Polyneuropathy, unspecified: Secondary | ICD-10-CM

## 2019-11-28 DIAGNOSIS — I4892 Unspecified atrial flutter: Secondary | ICD-10-CM | POA: Diagnosis not present

## 2019-11-28 DIAGNOSIS — G8929 Other chronic pain: Secondary | ICD-10-CM | POA: Diagnosis not present

## 2019-11-28 DIAGNOSIS — F324 Major depressive disorder, single episode, in partial remission: Secondary | ICD-10-CM | POA: Diagnosis not present

## 2019-11-28 DIAGNOSIS — K7031 Alcoholic cirrhosis of liver with ascites: Secondary | ICD-10-CM | POA: Diagnosis not present

## 2019-11-28 DIAGNOSIS — I959 Hypotension, unspecified: Secondary | ICD-10-CM

## 2019-12-20 ENCOUNTER — Telehealth (INDEPENDENT_AMBULATORY_CARE_PROVIDER_SITE_OTHER): Payer: Medicare Other | Admitting: Family Medicine

## 2019-12-20 ENCOUNTER — Encounter: Payer: Self-pay | Admitting: Family Medicine

## 2019-12-20 VITALS — Ht 69.0 in | Wt 140.0 lb

## 2019-12-20 DIAGNOSIS — B370 Candidal stomatitis: Secondary | ICD-10-CM | POA: Diagnosis not present

## 2019-12-20 DIAGNOSIS — J029 Acute pharyngitis, unspecified: Secondary | ICD-10-CM | POA: Diagnosis not present

## 2019-12-20 MED ORDER — FLUCONAZOLE 150 MG PO TABS: 150.0000 mg | ORAL_TABLET | ORAL | 0 refills | Status: DC | PRN

## 2019-12-20 NOTE — Progress Notes (Signed)
Name: Anita Reed   MRN: [id number]    DOB: 17-Feb-1961   Date:12/20/2019       Progress Note  Subjective:    Chief Complaint  Chief Complaint  Patient presents with  . Thrush    sore throat, blisters in mouth painful, swollen throat    I connected with  [name]  on 12/20/19 at  1:20 PM EDT by a video enabled telemedicine application and verified that I am speaking with the correct person using two identifiers.  I discussed the limitations of evaluation and management by telemedicine and the availability of in person appointments. The patient expressed understanding and agreed to proceed. Staff also discussed with the patient that there may be a patient responsible charge related to this service. Patient Location: home Provider Location: [facility] clinic Additional Individuals present: none   HPI  Pt had a really bad sore throat about 1-2 weeks ago She had sores on the edges of her mouth and then they developed inside of her cheeks and gums and eventually she developed sores red and white bumps to inner cheeks and back of throat She gargled with salt water yesterday and that did help it improve a little bit today she states that though white patches/spots are gone.  But she suspects that it will get worse again tomorrow.  She has not been around any sick contacts, she doubts mono, strep and she does not have any other upper respiratory or nasal symptoms.  Denies fever chills sweats rash any new body aches nothing new other than her baseline chronic pain.     Patient Active Problem List   Diagnosis Date Noted  . Major depressive disorder with single episode, in partial remission ([place]) 11/28/2019  . Neuropathy 11/28/2019  . Dysthymia 07/27/2019  . Other chronic pain 07/27/2019  . Muscle spasticity 07/27/2019  . C. difficile colitis   . Hypokalemia 07/15/2019  . Bandemia 07/15/2019  . Hypotension   . Goals of care, counseling/discussion   . Palliative care encounter   .  Muscle cramping 06/22/2019  . Alcohol abuse 06/22/2019  . Sepsis ([place]) 06/22/2019  . GIB (gastrointestinal bleeding) 06/01/2019  . GI bleeding 06/01/2019  . History of lymphoma 02/26/2019  . Atrial flutter ([place]) 02/07/2019  . Alcoholic cirrhosis of liver with ascites ([place]) 02/07/2019  . Persistent atrial fibrillation ([place]) 02/07/2019  . Anemia 02/07/2019  . Anorexia 02/07/2019  . GI bleed 01/28/2019  . Ascites 01/18/2019  . Cancer [facility])     Social History   Tobacco Use  . Smoking status: Current Every Day Smoker    Packs/day: 1.00    Years: 35.00    Pack years: 35.00    Types: Cigarettes  . Smokeless tobacco: Never Used  Substance Use Topics  . Alcohol use: Not Currently     Current Outpatient Medications:  .  digoxin (LANOXIN) 0.125 MG tablet, Take 1 tablet (125 mcg total) by mouth daily., Disp: 30 tablet, Rfl: 3 .  FLUoxetine (PROZAC) 20 MG tablet, Take 1 tablet (20 mg total) by mouth daily., Disp: 30 tablet, Rfl: 3 .  furosemide (LASIX) 40 MG tablet, Take 0.5 tablets (20 mg total) by mouth daily., Disp: 15 tablet, Rfl: 3 .  gabapentin (NEURONTIN) 400 MG capsule, Take 1 capsule (400 mg total) by mouth 3 (three) times daily., Disp: 270 capsule, Rfl: 1 .  midodrine (PROAMATINE) 5 MG tablet, Take 1 tablet (5 mg total) by mouth 3 (three) times daily with meals., Disp: 90 tablet, Rfl: 3 .  tiZANidine (ZANAFLEX) 4 MG capsule, Take 1 capsule (4 mg total) by mouth 3 (three) times daily as needed for muscle spasms., Disp: 270 capsule, Rfl: 0 .  lactulose (CHRONULAC) 10 GM/15ML solution, Take 45 mLs (30 g total) by mouth 3 (three) times daily. (Patient not taking: Reported on 12/20/2019), Disp: 236 mL, Rfl: 2  No Known Allergies  I personally reviewed active problem list, medication list, allergies, family history, social history, health maintenance, notes from last encounter, lab results, imaging with the patient/caregiver today.   Review of Systems  10 Systems reviewed and are  negative for acute change except as noted in the HPI.   Objective:   Virtual encounter, vitals limited, only able to obtain the following Today's Vitals   12/20/19 1146  Weight: 140 lb (63.5 kg)  Height: 5\' 9"  (1.753 m)   Body mass index is 20.67 kg/m. Nursing Note and Vital Signs reviewed.  Physical Exam Phonation clear, patient alert, no audible wheeze or stridor PE limited by telephone encounter  No results found for this or any previous visit (from the past 72 hour(s)).  Assessment and Plan:     ICD-10-CM   1. Oral thrush  B37.0 fluconazole (DIFLUCAN) 150 MG tablet  2. Pharyngitis, unspecified etiology  J02.9      Patient complains of sore throat gums cheeks and the back of her oropharynx for at least 2 to 3 weeks.  She did recently see Dr. Roxan Hockey here on March 31 and she notes that she started to have symptoms at that time.  She has had red sores and bumps to her inner cheeks and back of her throat she does suspect that it is oral thrush.  We did discuss other possible etiologies including other viral pharyngitis, mono, strep etc but patient states that she does not go anywhere and she is been at home and has not had any sick contacts.  She would like to be treated for thrush and we discussed follow-up in office or urgent care if not improving.  I did explain to her that we have limited testing available in family practice due to continued restrictions related to Covid and that she may need to go to urgent care to get these completed.  I did offer to work with her best I can to help her if her symptoms continue.  -Red flags and when to present for emergency care or RTC including fever >101.14F, chest pain, shortness of breath, new/worsening/un-resolving symptoms, reviewed with patient at time of visit. Follow up and care instructions discussed and provided in AVS. - I discussed the assessment and treatment plan with the patient. The patient was provided an opportunity to ask  questions and all were answered. The patient agreed with the plan and demonstrated an understanding of the instructions.  I provided 15 minutes of non-face-to-face time during this encounter. Encounter was completed with telephone only  Delsa Grana, PA-C 12/20/19 12:19 PM

## 2019-12-31 ENCOUNTER — Telehealth: Payer: Self-pay | Admitting: Family Medicine

## 2019-12-31 NOTE — Telephone Encounter (Signed)
Left message for patient to schedule Annual Wellness Visit.  Please schedule with Nurse Health Advisor Victoria Britt, RN at [place]  

## 2019-12-31 NOTE — Telephone Encounter (Signed)
Left message for patient to schedule Annual Wellness Visit.  Please schedule with Nurse Health Advisor KASEY UTHUS, RN.   Please disregard first phone message, had the wrong office to call back.   

## 2020-01-18 ENCOUNTER — Telehealth: Payer: Self-pay | Admitting: Family Medicine

## 2020-01-18 NOTE — Telephone Encounter (Signed)
Spoke with patient spouse, he stated she was taking a nap and to call back this afternoon

## 2020-01-22 ENCOUNTER — Other Ambulatory Visit: Payer: Self-pay

## 2020-01-22 MED ORDER — DIGOXIN 125 MCG PO TABS: 125.0000 ug | ORAL_TABLET | Freq: Every day | ORAL | 3 refills | Status: DC

## 2020-01-24 ENCOUNTER — Other Ambulatory Visit: Payer: Self-pay | Admitting: Internal Medicine

## 2020-01-24 DIAGNOSIS — F341 Dysthymic disorder: Secondary | ICD-10-CM

## 2020-01-24 MED ORDER — FLUOXETINE HCL 20 MG PO TABS: 20.0000 mg | ORAL_TABLET | Freq: Every day | ORAL | 3 refills | Status: DC

## 2020-01-24 NOTE — Telephone Encounter (Signed)
Requested Prescriptions  Pending Prescriptions Disp Refills  . FLUoxetine (PROZAC) 20 MG tablet 30 tablet 3    Sig: Take 1 tablet (20 mg total) by mouth daily.     Psychiatry:  Antidepressants - SSRI Passed - 01/24/2020  4:12 PM      Passed - Completed PHQ-2 or PHQ-9 in the last 360 days.      Passed - Valid encounter within last 6 months    Recent Outpatient Visits          1 month ago Oral thrush   [facility] Delsa Grana, PA-C   1 month ago Neuropathy   [place], MD   4 months ago Atrial flutter, unspecified type [facility])   [place], FNP   5 months ago Muscle spasticity   [place], FNP   6 months ago Gastrointestinal hemorrhage, unspecified gastrointestinal hemorrhage type   [place], [place]

## 2020-01-24 NOTE — Telephone Encounter (Signed)
Pt called in to request a refill for her FLUoxetine (PROZAC) 20 MG tablet   Pharmacy: [facility] [address], [place], [place] [address]

## 2020-01-29 ENCOUNTER — Other Ambulatory Visit: Payer: Self-pay | Admitting: Cardiovascular Disease

## 2020-01-29 DIAGNOSIS — I959 Hypotension, unspecified: Secondary | ICD-10-CM

## 2020-01-29 MED ORDER — MIDODRINE HCL 5 MG PO TABS: 5.0000 mg | ORAL_TABLET | Freq: Three times a day (TID) | ORAL | 3 refills | Status: DC

## 2020-01-29 MED ORDER — DIGOXIN 125 MCG PO TABS: 125.0000 ug | ORAL_TABLET | Freq: Every day | ORAL | 0 refills | Status: DC

## 2020-01-29 MED ORDER — MIDODRINE HCL 5 MG PO TABS: 5.0000 mg | ORAL_TABLET | Freq: Three times a day (TID) | ORAL | 0 refills | Status: DC

## 2020-01-29 MED ORDER — DIGOXIN 125 MCG PO TABS: 125.0000 ug | ORAL_TABLET | Freq: Every day | ORAL | 3 refills | Status: DC

## 2020-01-29 NOTE — Addendum Note (Signed)
Addended by: Britt Bottom on: 01/29/2020 10:53 AM   Modules accepted: Orders

## 2020-01-29 NOTE — Telephone Encounter (Signed)
Midodrine 5 mg tablet tid. Corrected q#240

## 2020-01-29 NOTE — Telephone Encounter (Signed)
*  STAT* If patient is at the pharmacy, call can be transferred to refill team.   1. Which medications need to be refilled? (please list name of each medication and dose if known) digoxin 0.125 daily, midodrine 5 mg 3x/day  2. Which pharmacy/location (including street and city if local pharmacy) is medication to be sent to? [facility] on [address]  3. Do they need a 30 day or 90 day supply? [place]

## 2020-01-29 NOTE — Telephone Encounter (Signed)
Requested Prescriptions   Signed Prescriptions Disp Refills  . digoxin (LANOXIN) 0.125 MG tablet 90 tablet 0    Sig: Take 1 tablet (125 mcg total) by mouth daily.    Authorizing Provider: Minna Merritts    Ordering User: Eugenio Hoes, Marnita Poirier C  . midodrine (PROAMATINE) 5 MG tablet 180 tablet 0    Sig: Take 1 tablet (5 mg total) by mouth 3 (three) times daily with meals.    Authorizing Provider: Minna Merritts    Ordering User: Britt Bottom

## 2020-02-07 ENCOUNTER — Other Ambulatory Visit: Payer: Self-pay | Admitting: Internal Medicine

## 2020-02-07 DIAGNOSIS — M62838 Other muscle spasm: Secondary | ICD-10-CM

## 2020-02-07 DIAGNOSIS — G8929 Other chronic pain: Secondary | ICD-10-CM

## 2020-02-26 ENCOUNTER — Ambulatory Visit (INDEPENDENT_AMBULATORY_CARE_PROVIDER_SITE_OTHER): Payer: Medicare Other | Admitting: Family Medicine

## 2020-02-26 ENCOUNTER — Encounter: Payer: Self-pay | Admitting: Family Medicine

## 2020-02-26 ENCOUNTER — Other Ambulatory Visit: Payer: Self-pay

## 2020-02-26 VITALS — BP 118/76 | HR 76 | Temp 97.1°F | Resp 18 | Ht 69.0 in | Wt 145.6 lb

## 2020-02-26 DIAGNOSIS — K429 Umbilical hernia without obstruction or gangrene: Secondary | ICD-10-CM | POA: Diagnosis not present

## 2020-02-26 DIAGNOSIS — K140 Glossitis: Secondary | ICD-10-CM

## 2020-02-26 DIAGNOSIS — K319 Disease of stomach and duodenum, unspecified: Secondary | ICD-10-CM

## 2020-02-26 DIAGNOSIS — Z8579 Personal history of other malignant neoplasms of lymphoid, hematopoietic and related tissues: Secondary | ICD-10-CM | POA: Diagnosis not present

## 2020-02-26 DIAGNOSIS — R739 Hyperglycemia, unspecified: Secondary | ICD-10-CM

## 2020-02-26 DIAGNOSIS — K7030 Alcoholic cirrhosis of liver without ascites: Secondary | ICD-10-CM

## 2020-02-26 DIAGNOSIS — E46 Unspecified protein-calorie malnutrition: Secondary | ICD-10-CM

## 2020-02-26 DIAGNOSIS — D649 Anemia, unspecified: Secondary | ICD-10-CM

## 2020-02-26 DIAGNOSIS — J029 Acute pharyngitis, unspecified: Secondary | ICD-10-CM | POA: Diagnosis not present

## 2020-02-26 DIAGNOSIS — E876 Hypokalemia: Secondary | ICD-10-CM

## 2020-02-26 DIAGNOSIS — B370 Candidal stomatitis: Secondary | ICD-10-CM

## 2020-02-26 MED ORDER — NYSTATIN 100000 UNIT/ML MT SUSP
500000.0000 [IU] | Freq: Four times a day (QID) | OROMUCOSAL | 2 refills | Status: DC
Start: 2020-02-26 — End: 2020-03-06

## 2020-02-26 MED ORDER — PANTOPRAZOLE SODIUM 20 MG PO TBEC: 20.0000 mg | DELAYED_RELEASE_TABLET | ORAL | 1 refills | Status: AC

## 2020-02-26 MED ORDER — FLUCONAZOLE 150 MG PO TABS: 150.0000 mg | ORAL_TABLET | ORAL | 1 refills | Status: DC | PRN

## 2020-02-26 NOTE — Progress Notes (Signed)
Name: Anita Reed   MRN: [id number]    DOB: [birth year]   Date:02/26/2020       Progress Note  Chief Complaint  Patient presents with  . Referral    to surgeon for hernia  . Thrush    recurrent     Subjective:   Anita Reed is a 59 y.o. female, presents to clinic for recurrent pharyngitis/reported to be thrush, she also presents for a referral to a general surgeon with concern of a hernia  Recurrent thrush and sores in mouth raw and red, frequent sores, worsening Visit 2 months ago she complained of the same and diflucan did effectively tx and throat was not as red then She has B1 deficiency and is supplement, no Hx of B12 deficiency that she knows of   Umbilical hernia sliding in and out when lifting things, is tender, gets to the size of the palm of her hand, she previously discussed with her PCP Anita Reed      Current Outpatient Medications:  .  digoxin (LANOXIN) 0.125 MG tablet, Take 1 tablet (125 mcg total) by mouth daily., Disp: 90 tablet, Rfl: 0 .  FLUoxetine (PROZAC) 20 MG tablet, Take 1 tablet (20 mg total) by mouth daily., Disp: 30 tablet, Rfl: 3 .  furosemide (LASIX) 40 MG tablet, Take 0.5 tablets (20 mg total) by mouth daily., Disp: 15 tablet, Rfl: 3 .  gabapentin (NEURONTIN) 400 MG capsule, Take 1 capsule (400 mg total) by mouth 3 (three) times daily., Disp: 270 capsule, Rfl: 1 .  metoprolol succinate (TOPROL-XL) 25 MG 24 hr tablet, Take 25 mg by mouth daily., Disp: , Rfl:  .  midodrine (PROAMATINE) 5 MG tablet, Take 1 tablet (5 mg total) by mouth 3 (three) times daily with meals., Disp: 270 tablet, Rfl: 0 .  tiZANidine (ZANAFLEX) 4 MG capsule, TAKE 1 CAPSULE BY MOUTH THREE TIMES DAILY AS NEEDED FOR MUSCLE SPASM, Disp: 270 capsule, Rfl: 1 .  fluconazole (DIFLUCAN) 150 MG tablet, Take 1 tablet (150 mg total) by mouth every 3 (three) days as needed (repeat in 3days if oral thrush not improving). (Patient not taking: Reported on 02/26/2020), Disp: 2 tablet, Rfl:  0 .  lactulose (CHRONULAC) 10 GM/15ML solution, Take 45 mLs (30 g total) by mouth 3 (three) times daily. (Patient not taking: Reported on 12/20/2019), Disp: 236 mL, Rfl: 2  Patient Active Problem List   Diagnosis Date Noted  . Major depressive disorder with single episode, in partial remission ([place]) 11/28/2019  . Neuropathy 11/28/2019  . Dysthymia 07/27/2019  . Other chronic pain 07/27/2019  . Muscle spasticity 07/27/2019  . C. difficile colitis   . Hypokalemia 07/15/2019  . Bandemia 07/15/2019  . Hypotension   . Goals of care, counseling/discussion   . Palliative care encounter   . Muscle cramping 06/22/2019  . Alcohol abuse 06/22/2019  . Sepsis ([place]) 06/22/2019  . GIB (gastrointestinal bleeding) 06/01/2019  . GI bleeding 06/01/2019  . History of lymphoma 02/26/2019  . Atrial flutter ([place]) 02/07/2019  . Alcoholic cirrhosis of liver with ascites ([place]) 02/07/2019  . Persistent atrial fibrillation ([place]) 02/07/2019  . Anemia 02/07/2019  . Anorexia 02/07/2019  . GI bleed 01/28/2019  . Ascites 01/18/2019  . Cancer [facility])     Past Surgical History:  Procedure Laterality Date  . ABDOMINAL HYSTERECTOMY    . APPENDECTOMY    . COLONOSCOPY N/A 06/03/2019   Procedure: COLONOSCOPY;  Surgeon: Toledo, Benay Pike, MD;  Location: [facility] ENDOSCOPY;  Service: Gastroenterology;  Laterality:  N/A;  . ESOPHAGOGASTRODUODENOSCOPY N/A 01/29/2019   Procedure: ESOPHAGOGASTRODUODENOSCOPY (EGD);  Surgeon: [name], MD;  Location: [facility] ENDOSCOPY;  Service: Gastroenterology;  Laterality: N/A;  . ESOPHAGOGASTRODUODENOSCOPY N/A 06/01/2019   Procedure: ESOPHAGOGASTRODUODENOSCOPY (EGD);  Surgeon: Toledo, Benay Pike, MD;  Location: [facility] ENDOSCOPY;  Service: Gastroenterology;  Laterality: N/A;  . IR PARACENTESIS  08/27/2019  . IR RADIOLOGIST EVAL & MGMT  08/08/2019  . IR RADIOLOGIST EVAL & MGMT  11/14/2019  . IR TIPS  08/27/2019  . RADIOLOGY WITH ANESTHESIA N/A 08/27/2019   Procedure: IR WITH ANESTHESIA TIPS  PROCEDURE;  Surgeon: [name], MD;  Location: [place];  Service: Radiology;  Laterality: N/A;  . TONSILLECTOMY      Family History  Problem Relation Age of Onset  . Cancer Mother   . Cancer Father     Social History   Tobacco Use  . Smoking status: Current Every Day Smoker    Packs/day: 1.00    Years: 35.00    Pack years: 35.00    Types: Cigarettes  . Smokeless tobacco: Never Used  Vaping Use  . Vaping Use: Never used  Substance Use Topics  . Alcohol use: Not Currently  . Drug use: Never     No Known Allergies  Health Maintenance  Topic Date Due  . COVID-19 Vaccine (1) Never done  . MAMMOGRAM  11/27/2020 (Originally 08/05/2011)  . TETANUS/TDAP  11/27/2020 (Originally 08/04/1980)  . INFLUENZA VACCINE  03/30/2020  . PAP SMEAR-Modifier  04/01/2022  . COLONOSCOPY  06/02/2029  . Hepatitis C Screening  Completed  . HIV Screening  Completed    Chart Review Today: I personally reviewed active problem list, medication list, allergies, family history, social history, health maintenance, notes from last encounter, lab results, imaging with the patient/caregiver today.   Review of Systems  10 Systems reviewed and are negative for acute change except as noted in the HPI.  Objective:   Vitals:   02/26/20 1118  BP: 118/76  Pulse: 76  Resp: 18  Temp: (!) 97.1 F (36.2 C)  TempSrc: Temporal  SpO2: 97%  Weight: 145 lb 9.6 oz (66 kg)  Height: 5\' 9"  (1.753 m)    Body mass index is 21.5 kg/m.  Physical Exam Vitals and nursing note reviewed.  Constitutional:      General: She is not in acute distress.    Appearance: She is ill-appearing (chronically il). She is not toxic-appearing or diaphoretic.  HENT:     Mouth/Throat:     Mouth: Mucous membranes are dry.     Pharynx: Posterior oropharyngeal erythema present.  Eyes:     General: No scleral icterus. Cardiovascular:     Rate and Rhythm: Normal rate and regular rhythm.     Pulses: Normal pulses.     Heart  sounds: Normal heart sounds.  Pulmonary:     Effort: Pulmonary effort is normal.     Breath sounds: Normal breath sounds.  Abdominal:     General: Bowel sounds are normal. There is no distension.     Palpations: Abdomen is soft.     Tenderness: There is no abdominal tenderness. There is no guarding or rebound.     Hernia: A hernia is present.  Skin:    General: Skin is warm and dry.     Coloration: Skin is not jaundiced or pale.  Neurological:     Mental Status: She is alert.     Gait: Gait normal.  Psychiatric:        Mood  and Affect: Mood normal.        Behavior: Behavior normal.         Assessment & Plan:     ICD-10-CM   1. Glossitis  K14.0 CBC with Differential/Platelet    Vitamin B12  2. Pharyngitis, unspecified etiology  J02.9 fluconazole (DIFLUCAN) 150 MG tablet   treat again for thrush, severely erythematous - consider referral to ETN or ID if continued recurrent infections  3. Umbilical hernia without obstruction and without gangrene  K42.9 Ambulatory referral to General Surgery   previously discussed and documented with prior PCP, not prolapsed right now, pt noted it bulges and is painful, seems to be gradually worsening  4. Hypokalemia  Q41.2 COMPLETE METABOLIC PANEL WITH GFR  5. History of lymphoma  Z85.79 CBC with Differential/Platelet   pt reports hx of untreated lymphoma and she refuses to address, discussed poor function of immune system with lymphoma- unclear if related to pharyngitis?  6. Alcoholic cirrhosis, unspecified whether ascites present (HCC)  K20.81 COMPLETE METABOLIC PANEL WITH GFR   cirrhosis, s/p TIPS procedure, no recent labs  7. Protein malnutrition ([place])  E46    pt denies any eating disorder - she brings up bulemia  8. Gastropathy  K31.9 pantoprazole (PROTONIX) 20 MG tablet   consider PPI?    9. Anemia, unspecified type  D64.9 CBC with Differential/Platelet    Vitamin B12   recheck labs  10. Thrush  B37.0 Hemoglobin A1c    fluconazole  (DIFLUCAN) 150 MG tablet  11. Hyperglycemia  R73.9 Hemoglobin A1c     No follow-ups on file.   Delsa Grana, PA-C 02/26/20 11:29 AM

## 2020-02-27 LAB — CBC WITH DIFFERENTIAL/PLATELET
Absolute Monocytes: 1613 cells/uL — ABNORMAL HIGH (ref 200–950)
Basophils Absolute: 67 cells/uL (ref 0–200)
Basophils Relative: 0.6 %
Eosinophils Absolute: 224 cells/uL (ref 15–500)
Eosinophils Relative: 2 %
HCT: 39.9 % (ref 35.0–45.0)
Hemoglobin: 13.4 g/dL (ref 11.7–15.5)
Lymphs Abs: 3842 cells/uL (ref 850–3900)
MCH: 30.5 pg (ref 27.0–33.0)
MCHC: 33.6 g/dL (ref 32.0–36.0)
MCV: 90.9 fL (ref 80.0–100.0)
MPV: 10.2 fL (ref 7.5–12.5)
Monocytes Relative: 14.4 %
Neutro Abs: 5454 cells/uL (ref 1500–7800)
Neutrophils Relative %: 48.7 %
Platelets: 202 10*3/uL (ref 140–400)
RBC: 4.39 10*6/uL (ref 3.80–5.10)
RDW: 16.9 % — ABNORMAL HIGH (ref 11.0–15.0)
Total Lymphocyte: 34.3 %
WBC: 11.2 10*3/uL — ABNORMAL HIGH (ref 3.8–10.8)

## 2020-02-27 LAB — COMPLETE METABOLIC PANEL WITH GFR
AG Ratio: 1.7 (calc) (ref 1.0–2.5)
ALT: 25 U/L (ref 6–29)
AST: 61 U/L — ABNORMAL HIGH (ref 10–35)
Albumin: 3.9 g/dL (ref 3.6–5.1)
Alkaline phosphatase (APISO): 394 U/L — ABNORMAL HIGH (ref 37–153)
BUN: 10 mg/dL (ref 7–25)
CO2: 30 mmol/L (ref 20–32)
Calcium: 9.5 mg/dL (ref 8.6–10.4)
Chloride: 100 mmol/L (ref 98–110)
Creat: 0.6 mg/dL (ref 0.50–1.05)
GFR, Est African American: 116 mL/min/{1.73_m2} (ref >=60)
GFR, Est Non African American: 100 mL/min/{1.73_m2} (ref >=60)
Globulin: 2.3 g/dL (calc) (ref 1.9–3.7)
Glucose, Bld: 54 mg/dL — ABNORMAL LOW (ref 65–99)
Potassium: 4.1 mmol/L (ref 3.5–5.3)
Sodium: 138 mmol/L (ref 135–146)
Total Bilirubin: 2.2 mg/dL — ABNORMAL HIGH (ref 0.2–1.2)
Total Protein: 6.2 g/dL (ref 6.1–8.1)

## 2020-02-27 LAB — VITAMIN B12: Vitamin B-12: 1210 pg/mL — ABNORMAL HIGH (ref 200–1100)

## 2020-02-27 LAB — HEMOGLOBIN A1C
Hgb A1c MFr Bld: 5.1 % of total Hgb (ref <5.7)
Mean Plasma Glucose: 100 (calc)
eAG (mmol/L): 5.5 (calc)

## 2020-02-28 ENCOUNTER — Other Ambulatory Visit: Payer: Self-pay | Admitting: Family Medicine

## 2020-02-28 ENCOUNTER — Other Ambulatory Visit: Payer: Self-pay

## 2020-02-28 ENCOUNTER — Ambulatory Visit: Payer: Medicare Other

## 2020-03-04 ENCOUNTER — Ambulatory Visit: Payer: Medicare Other | Admitting: Surgery

## 2020-03-06 ENCOUNTER — Encounter: Payer: Self-pay | Admitting: Surgery

## 2020-03-06 ENCOUNTER — Ambulatory Visit: Payer: Self-pay | Admitting: Surgery

## 2020-03-06 ENCOUNTER — Other Ambulatory Visit: Payer: Self-pay

## 2020-03-06 ENCOUNTER — Ambulatory Visit (INDEPENDENT_AMBULATORY_CARE_PROVIDER_SITE_OTHER): Payer: Medicare Other | Admitting: Surgery

## 2020-03-06 VITALS — BP 105/62 | HR 106 | Temp 98.1°F | Resp 15 | Ht 69.0 in | Wt 133.8 lb

## 2020-03-06 DIAGNOSIS — K439 Ventral hernia without obstruction or gangrene: Secondary | ICD-10-CM | POA: Diagnosis not present

## 2020-03-06 DIAGNOSIS — K7031 Alcoholic cirrhosis of liver with ascites: Secondary | ICD-10-CM

## 2020-03-06 NOTE — Progress Notes (Signed)
Patient ID: Anita Reed, female   DOB: [birth year], 59 y.o.   MRN: [id number]  Chief Complaint: Hernia  History of Present Illness Anita Reed is a 59 y.o. female with history of periumbilical bulge, associated discomfort.  The bulge reportedly comes and goes, typically diminishing in size and resolving in supine positioning.  Patient admits to being the size of pregnant with triplets prior to her TIPS procedure.  She has not had any peritoneal centesis since prior to her TIPS.  She recently had an episode of some swelling involving her extremities and to some degree her abdomen, she has resumed taking her diuretics.  She reports she struggles with not drinking wine, preferring sangria.  She is being extremely cautious not wanting to diminish her opportunity for possible hepatic transplant.  She is quite concerned about the recurring presence of her supraumbilical bulge.  Past Medical History Past Medical History:  Diagnosis Date  . Anemia   . Atrial flutter ([place])   . Cancer [facility])    lymphoma 11 yrs. ago  . Cirrhosis ([place])   . Esophageal varices ([place])   . Hepatitis    hep C-resolved  . History of hiatal hernia   . Hypertension   . Pneumonia   . Portal hypertension (HCC)       Past Surgical History:  Procedure Laterality Date  . ABDOMINAL HYSTERECTOMY    . APPENDECTOMY    . COLONOSCOPY N/A 06/03/2019   Procedure: COLONOSCOPY;  Surgeon: Toledo, Benay Pike, MD;  Location: [facility] ENDOSCOPY;  Service: Gastroenterology;  Laterality: N/A;  . ESOPHAGOGASTRODUODENOSCOPY N/A 01/29/2019   Procedure: ESOPHAGOGASTRODUODENOSCOPY (EGD);  Surgeon: [name], MD;  Location[facility] ENDOSCOPY;  Service: Gastroenterology;  Laterality: N/A;  . ESOPHAGOGASTRODUODENOSCOPY N/A 06/01/2019   Procedure: ESOPHAGOGASTRODUODENOSCOPY (EGD);  Surgeon: Toledo, Benay Pike, MD;  Location: [facility] ENDOSCOPY;  Service: Gastroenterology;  Laterality: N/A;  . IR PARACENTESIS  08/27/2019  . IR RADIOLOGIST EVAL & MGMT   08/08/2019  . IR RADIOLOGIST EVAL & MGMT  11/14/2019  . IR TIPS  08/27/2019  . RADIOLOGY WITH ANESTHESIA N/A 08/27/2019   Procedure: IR WITH ANESTHESIA TIPS PROCEDURE;  Surgeon: [name], MD;  Location: [facility];  Service: Radiology;  Laterality: N/A;  . TONSILLECTOMY      No Known Allergies  Current Outpatient Medications  Medication Sig Dispense Refill  . digoxin (LANOXIN) 0.125 MG tablet Take 1 tablet (125 mcg total) by mouth daily. 90 tablet 0  . fluconazole (DIFLUCAN) 150 MG tablet Take 1 tablet (150 mg total) by mouth every 3 (three) days as needed (for vaginal itching/yeast infection sx). 2 tablet 1  . FLUoxetine (PROZAC) 20 MG tablet Take 1 tablet (20 mg total) by mouth daily. 30 tablet 3  . furosemide (LASIX) 40 MG tablet Take 0.5 tablets (20 mg total) by mouth daily. 15 tablet 3  . gabapentin (NEURONTIN) 400 MG capsule Take 1 capsule (400 mg total) by mouth 3 (three) times daily. 270 capsule 1  . lactulose (CHRONULAC) 10 GM/15ML solution Take 45 mLs (30 g total) by mouth 3 (three) times daily. 236 mL 2  . metoprolol succinate (TOPROL-XL) 25 MG 24 hr tablet Take 25 mg by mouth daily.    . midodrine (PROAMATINE) 5 MG tablet Take 1 tablet (5 mg total) by mouth 3 (three) times daily with meals. 270 tablet 0  . pantoprazole (PROTONIX) 20 MG tablet Take 1 tablet (20 mg total) by mouth every morning. One hour before breakfast 30 tablet 1  . tiZANidine (ZANAFLEX) 4  MG capsule TAKE 1 CAPSULE BY MOUTH THREE TIMES DAILY AS NEEDED FOR MUSCLE SPASM 270 capsule 1   No current facility-administered medications for this visit.    Family History Family History  Problem Relation Age of Onset  . Cancer Mother   . Cancer Father       Social History Social History   Tobacco Use  . Smoking status: Current Every Day Smoker    Packs/day: 1.00    Years: 35.00    Pack years: 35.00    Types: Cigarettes  . Smokeless tobacco: Never Used  Vaping Use  . Vaping Use: Never used  Substance  Use Topics  . Alcohol use: Not Currently  . Drug use: Never        Review of Systems  Constitutional: Positive for weight loss.  HENT: Negative.   Eyes: Negative.   Respiratory: Negative.   Cardiovascular: Positive for palpitations and leg swelling (Recently, likely secondary to malnutrition,, responsive to diuretics unlikely related to any particular cardiac condition.).  Gastrointestinal: Positive for melena (Nothing recent, but history of bleeding prior to TIPS from esophageal varices.). Negative for abdominal pain, constipation, diarrhea, heartburn and nausea.  Genitourinary: Negative.   Skin: Negative.   Neurological: Negative.   Endo/Heme/Allergies: Negative.   Psychiatric/Behavioral: Positive for substance abuse (Wine/ethanol).      Physical Exam Blood pressure 105/62, pulse (!) 106, temperature 98.1 F (36.7 C), resp. rate 15, height 5\' 9"  (1.753 m), weight 133 lb 12.8 oz (60.7 kg), SpO2 96 %. Last Weight  Most recent update: 03/06/2020  1:49 PM   Weight  60.7 kg (133 lb 12.8 oz)            CONSTITUTIONAL: Well developed, and nourished, appropriately responsive and aware without distress.   EYES: Sclera non-icteric.   EARS, NOSE, MOUTH AND THROAT: Mask worn.  Hearing is intact to voice.  NECK: Trachea is midline, and there is no jugular venous distension.  LYMPH NODES:  Lymph nodes in the neck are not enlarged. RESPIRATORY:  Lungs are clear, and breath sounds are equal bilaterally. Normal respiratory effort without pathologic use of accessory muscles. CARDIOVASCULAR: Heart is regular in rate and rhythm. GI: The abdomen is soft, nontender, and nondistended. There were no palpable masses.  I could appreciate the silk sign of a decompressed hernia sac, I believe there is a small subcentimeter defect in the supraumbilical/epigastric region. There were normal bowel sounds. MUSCULOSKELETAL:  Symmetrical muscle tone appreciated in all four extremities.    SKIN: Skin turgor is  normal. No pathologic skin lesions appreciated.  NEUROLOGIC:  Motor and sensation appear grossly normal.  Cranial nerves are grossly without defect. PSYCH:  Alert and oriented to person, place and time. Affect is appropriate for situation.  Data Reviewed I have personally reviewed what is currently available of the patient's imaging, recent labs and medical records.   Labs:  CBC Latest Ref Rng & Units 02/26/2020 08/27/2019 07/30/2019  WBC 3.8 - 10.8 Thousand/uL 11.2(H) 14.9(H) 11.3(H)  Hemoglobin 11.7 - 15.5 g/dL 13.4 13.7 12.6  Hematocrit 35 - 45 % 39.9 42.5 38.1  Platelets 140 - 400 Thousand/uL 202 271 225   CMP Latest Ref Rng & Units 02/26/2020 08/27/2019 07/30/2019  Glucose 65 - 99 mg/dL 54(L) 117(H) 132(H)  BUN 7 - 25 mg/dL 10 5(L) 9  Creatinine 0.50 - 1.05 mg/dL 0.60 0.58 0.33(L)  Sodium 135 - 146 mmol/L 138 137 137  Potassium 3.5 - 5.3 mmol/L 4.1 4.3 3.5  Chloride 98 - 110  mmol/L 100 100 103  CO2 20 - 32 mmol/L 30 26 20   Calcium 8.6 - 10.4 mg/dL 9.5 9.0 8.6  Total Protein 6.1 - 8.1 g/dL 6.2 6.0(L) 5.3(L)  Total Bilirubin 0.2 - 1.2 mg/dL 2.2(H) 1.8(H) 1.8(H)  Alkaline Phos 38 - 126 U/L - 514(H) -  AST 10 - 35 U/L 61(H) 75(H) 69(H)  ALT 6 - 29 U/L 25 27 30(H)     Imaging: Radiology review: CT images reviewed from November/2020 CLINICAL DATA:  Abdominal pain, bloody stools  EXAM: CT ABDOMEN AND PELVIS WITH CONTRAST  TECHNIQUE: Multidetector CT imaging of the abdomen and pelvis was performed using the standard protocol following bolus administration of intravenous contrast.  CONTRAST:  140mL OMNIPAQUE IOHEXOL 300 MG/ML  SOLN  COMPARISON:  06/23/2019  FINDINGS: Lower chest: No acute abnormality  Hepatobiliary: Nodular contours of the liver compatible with cirrhosis. Heterogeneous enhancement. Concern for possible mass in the inferior right hepatic lobe versus regenerating nodule measuring 5.5 cm. No biliary ductal dilatation. Gallbladder is  distended.  Pancreas: No focal abnormality or ductal dilatation.  Spleen: Splenomegaly with a craniocaudal length of 14.4 cm.  Adrenals/Urinary Tract: No adrenal abnormality. No focal renal abnormality. No stones or hydronephrosis. Urinary bladder is unremarkable.  Stomach/Bowel: Stomach, large and small bowel grossly unremarkable.  Vascular/Lymphatic: Aortic atherosclerosis. No enlarged abdominal or pelvic lymph nodes.  Reproductive: Prior hysterectomy.  No adnexal masses.  Other: Large volume ascites in the abdomen and pelvis.  Musculoskeletal: No acute bony abnormality.  IMPRESSION: Changes of cirrhosis with splenomegaly and large volume ascites.  5.5 cm exophytic appearing masslike area off the inferior medial right hepatic lobe. This could reflect regenerating nodule, but recommend further evaluation with elective MRI of the liver to exclude hepatocellular carcinoma.  Aortic atherosclerosis.   Electronically Signed   By: Rolm Baptise M.D.   On: 07/14/2019 19:35   Within last 24 hrs: No results found.  Assessment    Small epigastric hernia, with larger subcutaneous hernia sac, likely developed from tense abdominal ascites, now with ascites resolved, small residual fascial defect remains.  No appreciable caput medusa a on pre-TIPS CT scan.  Patient Active Problem List   Diagnosis Date Noted  . Major depressive disorder with single episode, in partial remission ([place]) 11/28/2019  . Neuropathy 11/28/2019  . Dysthymia 07/27/2019  . Other chronic pain 07/27/2019  . Muscle spasticity 07/27/2019  . C. difficile colitis   . Hypokalemia 07/15/2019  . Bandemia 07/15/2019  . Hypotension   . Goals of care, counseling/discussion   . Palliative care encounter   . Muscle cramping 06/22/2019  . Alcohol abuse 06/22/2019  . Sepsis ([place]) 06/22/2019  . GIB (gastrointestinal bleeding) 06/01/2019  . GI bleeding 06/01/2019  . History of lymphoma 02/26/2019  .  Atrial flutter ([place]) 02/07/2019  . Alcoholic cirrhosis of liver with ascites ([place]) 02/07/2019  . Persistent atrial fibrillation ([place]) 02/07/2019  . Anemia 02/07/2019  . Anorexia 02/07/2019  . GI bleed 01/28/2019  . Ascites 01/18/2019  . Cancer [facility])     Plan    We discussed various options to repair the this small hernia I believe it is most appropriate to close the defect primarily with a couple sutures.  This will cause the least amount of trauma to the patient and most likely resolve her concerns regarding the waxing and waning bulge. Also provide the least amount of intervention should she eventually qualify for hepatic transplantation. She understands the risk of hernia recurrence is still present, but unlikely considering  the size of the fascial defect in the fact that she year has her ascites under control now secondary to her TIPS procedure. She also understands how critical it is for her to continue to abstain from alcohol use. Risks of anesthesia remain the greatest, we discussed risks of bleeding from injury to adjacent umbilical vessels.  Risk of infection is minimal.  She has had her questions answered, desires to proceed.  I have neither implied nor given any type of guarantees.  Face-to-face time spent with the patient and accompanying care providers(if present) was 45 minutes, with more than 50% of the time spent counseling, educating, and coordinating care of the patient.      [name] M.D., FACS 03/06/2020, 3:31 PM

## 2020-03-06 NOTE — Patient Instructions (Addendum)
You have requested to have a Ventral Hernia Repair. This will be done by Dr Christian Mate at [facility]. Please see your (BLUE) Pre-care sheet for more information. Our surgery scheduler will contact you to look at surgery dates and instructions.   You will need to arrange to be out of work for approximately 1-2 weeks and then you may return with a lifting restriction for 4 more weeks. If you have FMLA or Disability paperwork that needs to be filled out, please have your company fax your paperwork to [phone] or you may drop this by either office. This paperwork will be filled out within 3 days after your surgery has been completed.   Ventral Hernia A ventral hernia (also called an incisional hernia) is a hernia that occurs at the site of a previous surgical cut (incision) in the abdomen. The abdominal wall spans from your lower chest down to your pelvis. If the abdominal wall is weakened from a surgical incision, a hernia can occur. A hernia is a bulge of bowel or muscle tissue pushing out on the weakened part of the abdominal wall. Ventral hernias can get bigger from straining or lifting. Obese and older people are at higher risk for a ventral hernia. People who develop infections after surgery or require repeat incisions at the same site on the abdomen are also at increased risk. CAUSES  A ventral hernia occurs because of weakness in the abdominal wall at an incision site.  SYMPTOMS  Common symptoms include:  A visible bulge or lump on the abdominal wall.  Pain or tenderness around the lump.  Increased discomfort if you cough or make a sudden movement. If the hernia has blocked part of the intestine, a serious complication can occur (incarcerated or strangulated hernia). This can become a problem that requires emergency surgery because the blood flow to the blocked intestine may be cut off. Symptoms may include:  Feeling sick to your stomach (nauseous).  Throwing up (vomiting).  Stomach  swelling (distention) or bloating.  Fever.  Rapid heartbeat. DIAGNOSIS  Your health care provider will take a medical history and perform a physical exam. Various tests may be ordered, such as:  Blood tests.  Urine tests.  Ultrasonography.  X-rays.  Computed tomography (CT). TREATMENT  Watchful waiting may be all that is needed for a smaller hernia that does not cause symptoms. Your health care provider may recommend the use of a supportive belt (truss) that helps to keep the abdominal wall intact. For larger hernias or those that cause pain, surgery to repair the hernia is usually recommended. If a hernia becomes strangulated, emergency surgery needs to be done right away. HOME CARE INSTRUCTIONS  Avoid putting pressure or strain on the abdominal area.  Avoid heavy lifting.  Use good body positioning for physical tasks. Ask your health care provider about proper body positioning.  Use a supportive belt as directed by your health care provider.  Maintain a healthy weight.  Eat foods that are high in fiber, such as whole grains, fruits, and vegetables. Fiber helps prevent difficult bowel movements (constipation).  Drink enough fluids to keep your urine clear or pale yellow.  Follow up with your health care provider as directed. SEEK MEDICAL CARE IF:   Your hernia seems to be getting larger or more painful. SEEK IMMEDIATE MEDICAL CARE IF:   You have abdominal pain that is sudden and sharp.  Your pain becomes severe.  You have repeated vomiting.  You are sweating a lot.  You notice  a rapid heartbeat.  You develop a fever. MAKE SURE YOU:   Understand these instructions.  Will watch your condition.  Will get help right away if you are not doing well or get worse.     Open Ventral Hernia Repair Open ventral hernia repairis a surgery to fix a ventral hernia. Aventral hernia,  is a bulge of body tissue or intestines that pushes through the front part of the  abdomen. This can happen if the connective tissue covering the muscles over the abdomen has a weak spot or is torn because of a surgical cut (incision) from a previous surgery. A ventral hernia repair is often done soon after diagnosis to stop the hernia from getting bigger, becoming uncomfortable, or becoming an emergency. This surgery usually takes about 2 hours, but the time can vary greatly.  LET [facility] CARE PROVIDER KNOW ABOUT:  Any allergies you have.  All medicines you are taking, including steroids, vitamins, herbs, eye drops, creams, and over-the-counter medicines.  Previous problems you or members of your family have had with the use of anesthetics.  Any blood disorders you have.  Previous surgeries you have had.  Medical conditions you have.  RISKS AND COMPLICATIONS  Generally, Open ventral hernia repair is a safe procedure. However, as with any surgical procedure, problems can occur. Possible problems include:  Bleeding.  Trouble passing urine or having a bowel movement after the surgery.  Infection.  Pneumonia.  Blood clots.  Pain in the area of the hernia.  A bulge in the area of the hernia that may be caused by a collection of fluid.  Injury to intestines or other structures in the abdomen.  Return of the hernia after surgery.  BEFORE THE PROCEDURE   You may need to have blood tests, urine tests, a chest X-ray, or an electrocardiogram done before the day of the surgery.  Ask your health care provider about changing or stopping your regular medicines. This is especially important if you are taking diabetes medicines or blood thinners.  You may need to wash with a special type of germ-killing soap.  Do not eat or drink anything after midnight the night before the procedure or as directed by your health care provider.  Make plans to have someone drive you home after the procedure.  PROCEDURE   Small monitors will be put on your body. They are used  to check your heart, blood pressure, and oxygen level.  An IV access tube will be put into a vein in your hand or arm. Fluids and medicine will flow directly into your body through the IV tube.  You will be given medicine that makes you go to sleep (general anesthetic).  Your abdomen will be cleaned with a special soap to kill any germs on your skin.  Once you are asleep, a moderate - large size incision will be made in your abdomen. The size of incision depends on how large your hernia is.  Your surgeon puts the tissue or intestines that formed the hernia back in place.  A screen-like patch (mesh) is used to close the hernia. This helps make the area stronger. Stitches, tacks, or staples are used to keep the mesh in place.  Medicine and a bandage (dressing) or skin glue will be put over the incision.  AFTER THE PROCEDURE   You will stay in a recovery area until the anesthetic wears off. Your blood pressure and pulse will be checked often.  You may be able to  go home the same day or may need to stay in the hospital for 1-2 days after surgery. Your surgeon will decide when you can go home depending upon your recovery.  You may feel some pain. You will be given medicine for pain.  You will be urged to do breathing exercises that involve taking deep breaths. This helps prevent a lung infection after a surgery.  You may have to wear compression stockings while you are in the hospital. These stockings help keep blood clots from forming in your legs.   This information is not intended to replace advice given to you by your health care provider. Make sure you discuss any questions you have with your health care provider.   Document Released: 08/02/2012 Document Revised: 08/21/2013 Document Reviewed: 08/02/2012 Elsevier Interactive Patient Education 2016 [place], Adult     A hernia happens when tissue inside your body pushes out through a weak spot in your belly  muscles (abdominal wall). This makes a round lump (bulge). The lump may be:  In a scar from surgery that was done in your belly (incisional hernia).  Near your belly button (umbilical hernia).  In your groin (inguinal hernia). Your groin is the area where your leg meets your lower belly (abdomen). This kind of hernia could also be: ? In your scrotum, if you are female. ? In folds of skin around your vagina, if you are female.  In your upper thigh (femoral hernia).  Inside your belly (hiatal hernia). This happens when your stomach slides above the muscle between your belly and your chest (diaphragm). If your hernia is small and it does not cause pain, you may not need treatment. If your hernia is large or it causes pain, you may need surgery. Follow these instructions at home: Activity  Avoid stretching or overusing (straining) the muscles near your hernia. Straining can happen when you: ? Lift something heavy. ? Poop (have a bowel movement).  Do not lift anything that is heavier than 10 lb (4.5 kg), or the limit that you are told, until your doctor says that it is safe.  Use the strength of your legs when you lift something heavy. Do not use only your back muscles to lift. General instructions  Do these things if told by your doctor so you do not have trouble pooping (constipation): ? Drink enough fluid to keep your pee (urine) pale yellow. ? Eat foods that are high in fiber. These include fresh fruits and vegetables, whole grains, and beans. ? Limit foods that are high in fat and processed sugars. These include foods that are fried or sweet. ? Take medicine for trouble pooping.  When you cough, try to cough gently.  You may try to push your hernia in by very gently pressing on it when you are lying down. Do not try to force the bulge back in if it will not push in easily.  If you are overweight, work with your doctor to lose weight safely.  Do not use any products that have  nicotine or tobacco in them. These include cigarettes and e-cigarettes. If you need help quitting, ask your doctor.  If you will be having surgery (hernia repair), watch your hernia for changes in shape, size, or color. Tell your doctor if you see any changes.  Take over-the-counter and prescription medicines only as told by your doctor.  Keep all follow-up visits as told by your doctor. Contact a doctor if:  You get  new pain, swelling, or redness near your hernia.  You poop fewer times in a week than normal.  You have trouble pooping.  You have poop (stool) that is more dry than normal.  You have poop that is harder or larger than normal. Get help right away if:  You have a fever.  You have belly pain that gets worse.  You feel sick to your stomach (nauseous).  You throw up (vomit).  Your hernia cannot be pushed in by very gently pressing on it when you are lying down. Do not try to force the bulge back in if it will not push in easily.  Your hernia: ? Changes in shape or size. ? Changes color. ? Feels hard or it hurts when you touch it. These symptoms may represent a serious problem that is an emergency. Do not wait to see if the symptoms will go away. Get medical help right away. Call your local emergency services (911 in the [place] Summary  A hernia happens when tissue inside your body pushes out through a weak spot in the belly muscles. This creates a bulge.  If your hernia is small and it does not hurt, you may not need treatment. If your hernia is large or it hurts, you may need surgery.  If you will be having surgery, watch your hernia for changes in shape, size, or color. Tell your doctor about any changes. This information is not intended to replace advice given to you by your health care provider. Make sure you discuss any questions you have with your health care provider. Document Revised: 12/07/2018 Document Reviewed: 05/18/2017 Elsevier Patient Education   [place].

## 2020-03-06 NOTE — H&P (View-Only) (Signed)
Patient ID: Anita Reed, female   DOB: 1961/01/01, 59 y.o.   MRN: [id number]  Chief Complaint: Hernia  History of Present Illness Anita Reed is a 59 y.o. female with history of periumbilical bulge, associated discomfort.  The bulge reportedly comes and goes, typically diminishing in size and resolving in supine positioning.  Patient admits to being the size of pregnant with triplets prior to her TIPS procedure.  She has not had any peritoneal centesis since prior to her TIPS.  She recently had an episode of some swelling involving her extremities and to some degree her abdomen, she has resumed taking her diuretics.  She reports she struggles with not drinking wine, preferring sangria.  She is being extremely cautious not wanting to diminish her opportunity for possible hepatic transplant.  She is quite concerned about the recurring presence of her supraumbilical bulge.  Past Medical History Past Medical History:  Diagnosis Date  . Anemia   . Atrial flutter ([place])   . Cancer [facility])    lymphoma 11 yrs. ago  . Cirrhosis ([place])   . Esophageal varices ([place])   . Hepatitis    hep C-resolved  . History of hiatal hernia   . Hypertension   . Pneumonia   . Portal hypertension ([facility])       Past Surgical History:  Procedure Laterality Date  . ABDOMINAL HYSTERECTOMY    . APPENDECTOMY    . COLONOSCOPY N/A 06/03/2019   Procedure: COLONOSCOPY;  Surgeon: Toledo, Benay Pike, MD;  Location: [facility] ENDOSCOPY;  Service: Gastroenterology;  Laterality: N/A;  . ESOPHAGOGASTRODUODENOSCOPY N/A 01/29/2019   Procedure: ESOPHAGOGASTRODUODENOSCOPY (EGD);  Surgeon: [name], MD;  Location: [facility] ENDOSCOPY;  Service: Gastroenterology;  Laterality: N/A;  . ESOPHAGOGASTRODUODENOSCOPY N/A 06/01/2019   Procedure: ESOPHAGOGASTRODUODENOSCOPY (EGD);  Surgeon: Toledo, Benay Pike, MD;  Location: [facility] ENDOSCOPY;  Service: Gastroenterology;  Laterality: N/A;  . IR PARACENTESIS  08/27/2019  . IR RADIOLOGIST EVAL & MGMT   08/08/2019  . IR RADIOLOGIST EVAL & MGMT  11/14/2019  . IR TIPS  08/27/2019  . RADIOLOGY WITH ANESTHESIA N/A 08/27/2019   Procedure: IR WITH ANESTHESIA TIPS PROCEDURE;  Surgeon: [name], MD;  Location: [facility];  Service: Radiology;  Laterality: N/A;  . TONSILLECTOMY      No Known Allergies  Current Outpatient Medications  Medication Sig Dispense Refill  . digoxin (LANOXIN) 0.125 MG tablet Take 1 tablet (125 mcg total) by mouth daily. 90 tablet 0  . fluconazole (DIFLUCAN) 150 MG tablet Take 1 tablet (150 mg total) by mouth every 3 (three) days as needed (for vaginal itching/yeast infection sx). 2 tablet 1  . FLUoxetine (PROZAC) 20 MG tablet Take 1 tablet (20 mg total) by mouth daily. 30 tablet 3  . furosemide (LASIX) 40 MG tablet Take 0.5 tablets (20 mg total) by mouth daily. 15 tablet 3  . gabapentin (NEURONTIN) 400 MG capsule Take 1 capsule (400 mg total) by mouth 3 (three) times daily. 270 capsule 1  . lactulose (CHRONULAC) 10 GM/15ML solution Take 45 mLs (30 g total) by mouth 3 (three) times daily. 236 mL 2  . metoprolol succinate (TOPROL-XL) 25 MG 24 hr tablet Take 25 mg by mouth daily.    . midodrine (PROAMATINE) 5 MG tablet Take 1 tablet (5 mg total) by mouth 3 (three) times daily with meals. 270 tablet 0  . pantoprazole (PROTONIX) 20 MG tablet Take 1 tablet (20 mg total) by mouth every morning. One hour before breakfast 30 tablet 1  . tiZANidine (ZANAFLEX) 4  MG capsule TAKE 1 CAPSULE BY MOUTH THREE TIMES DAILY AS NEEDED FOR MUSCLE SPASM 270 capsule 1   No current facility-administered medications for this visit.    Family History Family History  Problem Relation Age of Onset  . Cancer Mother   . Cancer Father       Social History Social History   Tobacco Use  . Smoking status: Current Every Day Smoker    Packs/day: 1.00    Years: 35.00    Pack years: 35.00    Types: Cigarettes  . Smokeless tobacco: Never Used  Vaping Use  . Vaping Use: Never used  Substance  Use Topics  . Alcohol use: Not Currently  . Drug use: Never        Review of Systems  Constitutional: Positive for weight loss.  HENT: Negative.   Eyes: Negative.   Respiratory: Negative.   Cardiovascular: Positive for palpitations and leg swelling (Recently, likely secondary to malnutrition,, responsive to diuretics unlikely related to any particular cardiac condition.).  Gastrointestinal: Positive for melena (Nothing recent, but history of bleeding prior to TIPS from esophageal varices.). Negative for abdominal pain, constipation, diarrhea, heartburn and nausea.  Genitourinary: Negative.   Skin: Negative.   Neurological: Negative.   Endo/Heme/Allergies: Negative.   Psychiatric/Behavioral: Positive for substance abuse (Wine/ethanol).      Physical Exam Blood pressure 105/62, pulse (!) 106, temperature 98.1 F (36.7 C), resp. rate 15, height 5\' 9"  (1.753 m), weight 133 lb 12.8 oz (60.7 kg), SpO2 96 %. Last Weight  Most recent update: 03/06/2020  1:49 PM   Weight  60.7 kg (133 lb 12.8 oz)            CONSTITUTIONAL: Well developed, and nourished, appropriately responsive and aware without distress.   EYES: Sclera non-icteric.   EARS, NOSE, MOUTH AND THROAT: Mask worn.  Hearing is intact to voice.  NECK: Trachea is midline, and there is no jugular venous distension.  LYMPH NODES:  Lymph nodes in the neck are not enlarged. RESPIRATORY:  Lungs are clear, and breath sounds are equal bilaterally. Normal respiratory effort without pathologic use of accessory muscles. CARDIOVASCULAR: Heart is regular in rate and rhythm. GI: The abdomen is soft, nontender, and nondistended. There were no palpable masses.  I could appreciate the silk sign of a decompressed hernia sac, I believe there is a small subcentimeter defect in the supraumbilical/epigastric region. There were normal bowel sounds. MUSCULOSKELETAL:  Symmetrical muscle tone appreciated in all four extremities.    SKIN: Skin turgor is  normal. No pathologic skin lesions appreciated.  NEUROLOGIC:  Motor and sensation appear grossly normal.  Cranial nerves are grossly without defect. PSYCH:  Alert and oriented to person, place and time. Affect is appropriate for situation.  Data Reviewed I have personally reviewed what is currently available of the patient's imaging, recent labs and medical records.   Labs:  CBC Latest Ref Rng & Units 02/26/2020 08/27/2019 07/30/2019  WBC 3.8 - 10.8 Thousand/uL 11.2(H) 14.9(H) 11.3(H)  Hemoglobin 11.7 - 15.5 g/dL 13.4 13.7 12.6  Hematocrit 35 - 45 % 39.9 42.5 38.1  Platelets 140 - 400 Thousand/uL 202 271 225   CMP Latest Ref Rng & Units 02/26/2020 08/27/2019 07/30/2019  Glucose 65 - 99 mg/dL 54(L) 117(H) 132(H)  BUN 7 - 25 mg/dL 10 5(L) 9  Creatinine 0.50 - 1.05 mg/dL 0.60 0.58 0.33(L)  Sodium 135 - 146 mmol/L 138 137 137  Potassium 3.5 - 5.3 mmol/L 4.1 4.3 3.5  Chloride 98 - 110  mmol/L 100 100 103  CO2 20 - 32 mmol/L 30 26 20   Calcium 8.6 - 10.4 mg/dL 9.5 9.0 8.6  Total Protein 6.1 - 8.1 g/dL 6.2 6.0(L) 5.3(L)  Total Bilirubin 0.2 - 1.2 mg/dL 2.2(H) 1.8(H) 1.8(H)  Alkaline Phos 38 - 126 U/L - 514(H) -  AST 10 - 35 U/L 61(H) 75(H) 69(H)  ALT 6 - 29 U/L 25 27 30(H)     Imaging: Radiology review: CT images reviewed from November/2020 CLINICAL DATA:  Abdominal pain, bloody stools  EXAM: CT ABDOMEN AND PELVIS WITH CONTRAST  TECHNIQUE: Multidetector CT imaging of the abdomen and pelvis was performed using the standard protocol following bolus administration of intravenous contrast.  CONTRAST:  136mL OMNIPAQUE IOHEXOL 300 MG/ML  SOLN  COMPARISON:  06/23/2019  FINDINGS: Lower chest: No acute abnormality  Hepatobiliary: Nodular contours of the liver compatible with cirrhosis. Heterogeneous enhancement. Concern for possible mass in the inferior right hepatic lobe versus regenerating nodule measuring 5.5 cm. No biliary ductal dilatation. Gallbladder is  distended.  Pancreas: No focal abnormality or ductal dilatation.  Spleen: Splenomegaly with a craniocaudal length of 14.4 cm.  Adrenals/Urinary Tract: No adrenal abnormality. No focal renal abnormality. No stones or hydronephrosis. Urinary bladder is unremarkable.  Stomach/Bowel: Stomach, large and small bowel grossly unremarkable.  Vascular/Lymphatic: Aortic atherosclerosis. No enlarged abdominal or pelvic lymph nodes.  Reproductive: Prior hysterectomy.  No adnexal masses.  Other: Large volume ascites in the abdomen and pelvis.  Musculoskeletal: No acute bony abnormality.  IMPRESSION: Changes of cirrhosis with splenomegaly and large volume ascites.  5.5 cm exophytic appearing masslike area off the inferior medial right hepatic lobe. This could reflect regenerating nodule, but recommend further evaluation with elective MRI of the liver to exclude hepatocellular carcinoma.  Aortic atherosclerosis.   Electronically Signed   By: Rolm Baptise M.D.   On: 07/14/2019 19:35   Within last 24 hrs: No results found.  Assessment    Small epigastric hernia, with larger subcutaneous hernia sac, likely developed from tense abdominal ascites, now with ascites resolved, small residual fascial defect remains.  No appreciable caput medusa a on pre-TIPS CT scan.  Patient Active Problem List   Diagnosis Date Noted  . Major depressive disorder with single episode, in partial remission ([place]) 11/28/2019  . Neuropathy 11/28/2019  . Dysthymia 07/27/2019  . Other chronic pain 07/27/2019  . Muscle spasticity 07/27/2019  . C. difficile colitis   . Hypokalemia 07/15/2019  . Bandemia 07/15/2019  . Hypotension   . Goals of care, counseling/discussion   . Palliative care encounter   . Muscle cramping 06/22/2019  . Alcohol abuse 06/22/2019  . Sepsis ([place]) 06/22/2019  . GIB (gastrointestinal bleeding) 06/01/2019  . GI bleeding 06/01/2019  . History of lymphoma 02/26/2019  .  Atrial flutter ([place]) 02/07/2019  . Alcoholic cirrhosis of liver with ascites ([place]) 02/07/2019  . Persistent atrial fibrillation ([place]) 02/07/2019  . Anemia 02/07/2019  . Anorexia 02/07/2019  . GI bleed 01/28/2019  . Ascites 01/18/2019  . Cancer [facility])     Plan    We discussed various options to repair the this small hernia I believe it is most appropriate to close the defect primarily with a couple sutures.  This will cause the least amount of trauma to the patient and most likely resolve her concerns regarding the waxing and waning bulge. Also provide the least amount of intervention should she eventually qualify for hepatic transplantation. She understands the risk of hernia recurrence is still present, but unlikely considering  the size of the fascial defect in the fact that she year has her ascites under control now secondary to her TIPS procedure. She also understands how critical it is for her to continue to abstain from alcohol use. Risks of anesthesia remain the greatest, we discussed risks of bleeding from injury to adjacent umbilical vessels.  Risk of infection is minimal.  She has had her questions answered, desires to proceed.  I have neither implied nor given any type of guarantees.  Face-to-face time spent with the patient and accompanying care providers(if present) was 45 minutes, with more than 50% of the time spent counseling, educating, and coordinating care of the patient.      [name] M.D., FACS 03/06/2020, 3:31 PM

## 2020-03-07 ENCOUNTER — Telehealth: Payer: Self-pay | Admitting: Surgery

## 2020-03-07 NOTE — Telephone Encounter (Signed)
Pt has been advised of Pre-Admission date/time, COVID Testing date and Surgery date.  Surgery Date: 03/14/20  Preadmission Testing Date: 03/12/20 (phone 1p-5p) Covid Testing Date: 03/13/20 - patient advised to go to the [facility] ([place]) between 8a-1p   Patient has been made aware to call [phone], between 1-3:00pm the day before surgery, to find out what time to arrive for surgery.

## 2020-03-12 ENCOUNTER — Other Ambulatory Visit: Payer: Self-pay

## 2020-03-12 ENCOUNTER — Other Ambulatory Visit
Admission: RE | Admit: 2020-03-12 | Discharge: 2020-03-12 | Disposition: A | Discharge status: Home / Self Care | Payer: Medicare Other | Source: Ambulatory Visit | Attending: Surgery | Admitting: Surgery

## 2020-03-12 DIAGNOSIS — Z20822 Contact with and (suspected) exposure to covid-19: Secondary | ICD-10-CM | POA: Insufficient documentation

## 2020-03-12 DIAGNOSIS — Z01818 Encounter for other preprocedural examination: Secondary | ICD-10-CM | POA: Insufficient documentation

## 2020-03-12 HISTORY — DX: Cardiac arrhythmia, unspecified: I49.9

## 2020-03-12 HISTORY — DX: Gastro-esophageal reflux disease without esophagitis: K21.9

## 2020-03-12 NOTE — Patient Instructions (Signed)
Your procedure is scheduled on: Friday 03/14/20.  Report to DAY SURGERY DEPARTMENT LOCATED ON 2ND FLOOR MEDICAL MALL ENTRANCE. To find out your arrival time please call [phone] between 1PM - 3PM on Thursday 03/13/20.   Remember: Instructions that are not followed completely may result in serious medical risk, up to and including death, or upon the discretion of your surgeon and anesthesiologist your surgery may need to be rescheduled.     __X__ 1. Do not eat food after midnight the night before your procedure.                 No gum chewing or hard candies. You may drink clear liquids up to 2 hours                 before you are scheduled to arrive for your surgery- DO NOT drink clear                 liquids within 2 hours of the start of your surgery.                 Clear Liquids include:  water, apple juice without pulp, clear carbohydrate                 drink such as Clearfast or Gatorade, Black Coffee or Tea (Do not add                 milk or creamer to coffee or tea).    __X__2.  On the morning of surgery brush your teeth with toothpaste and water, you may rinse your mouth with mouthwash if you wish.  Do not swallow any toothpaste or mouthwash.     __X__ 3.  No Alcohol for 24 hours before or after surgery.   __X__ 4.  Do Not Smoke or use e-cigarettes For 24 Hours Prior to Your Surgery.                 Do not use any chewable tobacco products for at least 6 hours prior to                 surgery.   __X__5.  Notify your doctor if there is any change in your medical condition      (cold, fever, infections).      Do NOT wear jewelry, make-up, hairpins, clips or nail polish. Do NOT wear lotions, powders, or perfumes.  Do NOT shave 48 hours prior to surgery. Men may shave face and neck. Do NOT bring valuables to the hospital.     [facility] is not responsible for any belongings or valuables.   Contacts, dentures/partials or body piercings may not be worn into  surgery. Bring a case for your contacts, glasses or hearing aids, a denture cup will be supplied.    Patients discharged the day of surgery will not be allowed to drive home.     __X__ Take these medicines the morning of surgery with A SIP OF WATER:     1. digoxin (LANOXIN)  2. gabapentin (NEURONTIN)   3. metoprolol succinate   4. pantoprazole (PROTONIX)  5. midodrine (PROAMATINE)     __X__ Use CHG Soap as directed    __X__ Stop Anti-inflammatories 7 days before surgery such as Advil, Ibuprofen, Motrin, BC or Goodies Powder, Naprosyn, Naproxen, Aleve, Aspirin, Meloxicam. May take Tylenol if needed for pain or discomfort.    __X__Do not start taking any new herbal supplements or vitamins prior to your  procedure.   Wear comfortable clothing (specific to your surgery type) to the hospital.  Plan for stool softeners for home use; pain medications have a tendency to cause constipation. You can also help prevent constipation by eating foods high in fiber such as fruits and vegetables and drinking plenty of fluids as your diet allows.  After surgery, you can prevent lung complications by doing breathing exercises.Take deep breaths and cough every 1-2 hours. Your doctor may order a device called an Incentive Spirometer to help you take deep breaths.  Please call the [facility] Department at [phone] if you have any questions about these instructions

## 2020-03-13 ENCOUNTER — Encounter
Admission: RE | Admit: 2020-03-13 | Discharge: 2020-03-13 | Disposition: A | Discharge status: Home / Self Care | Payer: Medicare Other | Source: Ambulatory Visit | Attending: Surgery | Admitting: Surgery

## 2020-03-13 DIAGNOSIS — Z01818 Encounter for other preprocedural examination: Secondary | ICD-10-CM | POA: Diagnosis not present

## 2020-03-13 DIAGNOSIS — Z01810 Encounter for preprocedural cardiovascular examination: Secondary | ICD-10-CM | POA: Diagnosis not present

## 2020-03-13 DIAGNOSIS — Z20822 Contact with and (suspected) exposure to covid-19: Secondary | ICD-10-CM | POA: Diagnosis not present

## 2020-03-13 LAB — SARS CORONAVIRUS 2 (TAT 6-24 HRS): SARS Coronavirus 2: NEGATIVE

## 2020-03-17 ENCOUNTER — Telehealth: Payer: Self-pay | Admitting: Surgery

## 2020-03-17 NOTE — Telephone Encounter (Signed)
Updated information regarding surgery. Pt has been advised of Pre-Admission date/time, COVID Testing date and Surgery date.  Surgery Date: 03/21/20 Preadmission Testing Date: 03/12/20 (phone done) Covid Testing Date: 03/19/20 - patient advised to go to the [facility] ([place]) between 8a-1p   Patient has been made aware to call [phone], between 1-3:00pm the day before surgery, to find out what time to arrive for surgery.

## 2020-03-19 ENCOUNTER — Other Ambulatory Visit
Admission: RE | Admit: 2020-03-19 | Discharge: 2020-03-19 | Disposition: A | Discharge status: Home / Self Care | Payer: Medicare Other | Source: Ambulatory Visit | Attending: Surgery | Admitting: Surgery

## 2020-03-19 ENCOUNTER — Other Ambulatory Visit: Payer: Self-pay

## 2020-03-19 DIAGNOSIS — Z20822 Contact with and (suspected) exposure to covid-19: Secondary | ICD-10-CM | POA: Diagnosis not present

## 2020-03-19 DIAGNOSIS — Z01812 Encounter for preprocedural laboratory examination: Secondary | ICD-10-CM | POA: Insufficient documentation

## 2020-03-20 LAB — SARS CORONAVIRUS 2 (TAT 6-24 HRS): SARS Coronavirus 2: NEGATIVE

## 2020-03-21 ENCOUNTER — Encounter: Payer: Self-pay | Admitting: Surgery

## 2020-03-21 ENCOUNTER — Ambulatory Visit
Admission: RE | Admit: 2020-03-21 | Discharge: 2020-03-21 | Disposition: A | Discharge status: Home / Self Care | Payer: Medicare Other | Attending: Surgery | Admitting: Surgery

## 2020-03-21 ENCOUNTER — Ambulatory Visit: Payer: Medicare Other | Admitting: Anesthesiology

## 2020-03-21 ENCOUNTER — Encounter
Admission: RE | Disposition: A | Discharge status: Home / Self Care | Payer: Self-pay | Source: Home / Self Care | Attending: Surgery

## 2020-03-21 ENCOUNTER — Other Ambulatory Visit: Payer: Self-pay

## 2020-03-21 DIAGNOSIS — G629 Polyneuropathy, unspecified: Secondary | ICD-10-CM | POA: Insufficient documentation

## 2020-03-21 DIAGNOSIS — I10 Essential (primary) hypertension: Secondary | ICD-10-CM | POA: Insufficient documentation

## 2020-03-21 DIAGNOSIS — F17210 Nicotine dependence, cigarettes, uncomplicated: Secondary | ICD-10-CM | POA: Diagnosis not present

## 2020-03-21 DIAGNOSIS — F324 Major depressive disorder, single episode, in partial remission: Secondary | ICD-10-CM | POA: Diagnosis not present

## 2020-03-21 DIAGNOSIS — F329 Major depressive disorder, single episode, unspecified: Secondary | ICD-10-CM | POA: Diagnosis not present

## 2020-03-21 DIAGNOSIS — G8929 Other chronic pain: Secondary | ICD-10-CM | POA: Diagnosis not present

## 2020-03-21 DIAGNOSIS — K7031 Alcoholic cirrhosis of liver with ascites: Secondary | ICD-10-CM | POA: Diagnosis not present

## 2020-03-21 DIAGNOSIS — Z8572 Personal history of non-Hodgkin lymphomas: Secondary | ICD-10-CM | POA: Diagnosis not present

## 2020-03-21 DIAGNOSIS — Z79899 Other long term (current) drug therapy: Secondary | ICD-10-CM | POA: Insufficient documentation

## 2020-03-21 DIAGNOSIS — K219 Gastro-esophageal reflux disease without esophagitis: Secondary | ICD-10-CM | POA: Insufficient documentation

## 2020-03-21 DIAGNOSIS — K439 Ventral hernia without obstruction or gangrene: Secondary | ICD-10-CM | POA: Diagnosis not present

## 2020-03-21 DIAGNOSIS — K766 Portal hypertension: Secondary | ICD-10-CM | POA: Insufficient documentation

## 2020-03-21 DIAGNOSIS — I4819 Other persistent atrial fibrillation: Secondary | ICD-10-CM | POA: Diagnosis not present

## 2020-03-21 DIAGNOSIS — I4892 Unspecified atrial flutter: Secondary | ICD-10-CM | POA: Insufficient documentation

## 2020-03-21 HISTORY — PX: EPIGASTRIC HERNIA REPAIR: SHX404

## 2020-03-21 LAB — POCT I-STAT, CHEM 8
BUN: 19 mg/dL (ref 6–20)
Calcium, Ion: 1.17 mmol/L (ref 1.15–1.40)
Chloride: 103 mmol/L (ref 98–111)
Creatinine, Ser: 0.9 mg/dL (ref 0.44–1.00)
Glucose, Bld: 108 mg/dL — ABNORMAL HIGH (ref 70–99)
HCT: 41 % (ref 36.0–46.0)
Hemoglobin: 13.9 g/dL (ref 12.0–15.0)
Potassium: 3.8 mmol/L (ref 3.5–5.1)
Sodium: 141 mmol/L (ref 135–145)
TCO2: 25 mmol/L (ref 22–32)

## 2020-03-21 SURGERY — REPAIR, HERNIA, EPIGASTRIC, ADULT
Anesthesia: General

## 2020-03-21 MED ORDER — ONDANSETRON HCL 4 MG/2ML IJ SOLN
INTRAMUSCULAR | Status: DC | PRN
  Administered 2020-03-21: 4 mg via INTRAVENOUS

## 2020-03-21 MED ORDER — PROPOFOL 10 MG/ML IV BOLUS
INTRAVENOUS | Status: AC
  Filled 2020-03-21: qty 20

## 2020-03-21 MED ORDER — SUGAMMADEX SODIUM 200 MG/2ML IV SOLN
INTRAVENOUS | Status: DC | PRN
  Administered 2020-03-21: 200 mg via INTRAVENOUS

## 2020-03-21 MED ORDER — BUPIVACAINE LIPOSOME 1.3 % IJ SUSP
INTRAMUSCULAR | Status: DC | PRN
  Administered 2020-03-21: 20 mL

## 2020-03-21 MED ORDER — KETAMINE HCL 50 MG/ML IJ SOLN
INTRAMUSCULAR | Status: AC
  Filled 2020-03-21: qty 10

## 2020-03-21 MED ORDER — ONDANSETRON HCL 4 MG/2ML IJ SOLN
INTRAMUSCULAR | Status: AC
  Filled 2020-03-21: qty 2

## 2020-03-21 MED ORDER — FAMOTIDINE 20 MG PO TABS
ORAL_TABLET | ORAL | Status: AC
  Administered 2020-03-21: 20 mg
  Filled 2020-03-21: qty 1

## 2020-03-21 MED ORDER — BUPIVACAINE LIPOSOME 1.3 % IJ SUSP
INTRAMUSCULAR | Status: AC
  Filled 2020-03-21: qty 20

## 2020-03-21 MED ORDER — ONDANSETRON HCL 4 MG/2ML IJ SOLN: 4.0000 mg | Freq: Once | INTRAMUSCULAR | Status: DC | PRN

## 2020-03-21 MED ORDER — DEXAMETHASONE SODIUM PHOSPHATE 10 MG/ML IJ SOLN
INTRAMUSCULAR | Status: DC | PRN
  Administered 2020-03-21: 10 mg via INTRAVENOUS

## 2020-03-21 MED ORDER — DEXAMETHASONE SODIUM PHOSPHATE 10 MG/ML IJ SOLN
INTRAMUSCULAR | Status: AC
  Filled 2020-03-21: qty 1

## 2020-03-21 MED ORDER — CHLORHEXIDINE GLUCONATE 0.12 % MT SOLN
OROMUCOSAL | Status: AC
  Administered 2020-03-21: 15 mL via OROMUCOSAL
  Filled 2020-03-21: qty 15

## 2020-03-21 MED ORDER — CEFAZOLIN SODIUM-DEXTROSE 2-4 GM/100ML-% IV SOLN
2.0000 g | INTRAVENOUS | Status: AC
  Administered 2020-03-21: 2 g via INTRAVENOUS

## 2020-03-21 MED ORDER — FENTANYL CITRATE (PF) 100 MCG/2ML IJ SOLN
INTRAMUSCULAR | Status: DC | PRN
  Administered 2020-03-21 (×2): 50 ug via INTRAVENOUS

## 2020-03-21 MED ORDER — LIDOCAINE HCL (PF) 2 % IJ SOLN
INTRAMUSCULAR | Status: AC
  Filled 2020-03-21: qty 5

## 2020-03-21 MED ORDER — BUPIVACAINE LIPOSOME 1.3 % IJ SUSP: 20.0000 mL | Freq: Once | INTRAMUSCULAR | Status: DC

## 2020-03-21 MED ORDER — FENTANYL CITRATE (PF) 100 MCG/2ML IJ SOLN: 25.0000 ug | INTRAMUSCULAR | Status: DC | PRN

## 2020-03-21 MED ORDER — OXYCODONE HCL 5 MG PO TABS
5.0000 mg | ORAL_TABLET | Freq: Once | ORAL | Status: AC
  Administered 2020-03-21: 5 mg via ORAL

## 2020-03-21 MED ORDER — FENTANYL CITRATE (PF) 100 MCG/2ML IJ SOLN
INTRAMUSCULAR | Status: AC
  Filled 2020-03-21: qty 2

## 2020-03-21 MED ORDER — DIGOXIN 125 MCG PO TABS
0.1250 mg | ORAL_TABLET | Freq: Once | ORAL | Status: AC
  Administered 2020-03-21: 0.125 mg via ORAL
  Filled 2020-03-21: qty 1

## 2020-03-21 MED ORDER — EPHEDRINE SULFATE 50 MG/ML IJ SOLN
INTRAMUSCULAR | Status: DC | PRN
  Administered 2020-03-21: 10 mg via INTRAVENOUS
  Administered 2020-03-21: 5 mg via INTRAVENOUS
  Administered 2020-03-21 (×3): 10 mg via INTRAVENOUS

## 2020-03-21 MED ORDER — BUPIVACAINE-EPINEPHRINE (PF) 0.25% -1:200000 IJ SOLN
INTRAMUSCULAR | Status: AC
  Filled 2020-03-21: qty 30

## 2020-03-21 MED ORDER — SODIUM CHLORIDE FLUSH 0.9 % IV SOLN
INTRAVENOUS | Status: AC
  Filled 2020-03-21: qty 10

## 2020-03-21 MED ORDER — CHLORHEXIDINE GLUCONATE CLOTH 2 % EX PADS
6.0000 | MEDICATED_PAD | Freq: Once | CUTANEOUS | Status: AC
  Administered 2020-03-21: 6 via TOPICAL

## 2020-03-21 MED ORDER — KETAMINE HCL 50 MG/ML IJ SOLN
INTRAMUSCULAR | Status: DC | PRN
  Administered 2020-03-21: 20 mg via INTRAMUSCULAR
  Administered 2020-03-21: 35 mg via INTRAMUSCULAR

## 2020-03-21 MED ORDER — EPHEDRINE 5 MG/ML INJ
INTRAVENOUS | Status: AC
  Filled 2020-03-21: qty 10

## 2020-03-21 MED ORDER — MIDAZOLAM HCL 2 MG/2ML IJ SOLN
INTRAMUSCULAR | Status: DC | PRN
  Administered 2020-03-21: 2 mg via INTRAVENOUS

## 2020-03-21 MED ORDER — CHLORHEXIDINE GLUCONATE CLOTH 2 % EX PADS: 6.0000 | MEDICATED_PAD | Freq: Once | CUTANEOUS | Status: DC

## 2020-03-21 MED ORDER — KETOROLAC TROMETHAMINE 30 MG/ML IJ SOLN
INTRAMUSCULAR | Status: DC | PRN
  Administered 2020-03-21: 30 mg via INTRAVENOUS

## 2020-03-21 MED ORDER — LACTATED RINGERS IV SOLN: INTRAVENOUS | Status: DC

## 2020-03-21 MED ORDER — OXYCODONE HCL 5 MG PO TABS: 5.0000 mg | ORAL_TABLET | Freq: Four times a day (QID) | ORAL | 0 refills | Status: DC | PRN

## 2020-03-21 MED ORDER — METOPROLOL SUCCINATE ER 25 MG PO TB24
25.0000 mg | ORAL_TABLET | Freq: Once | ORAL | Status: AC
  Administered 2020-03-21: 25 mg via ORAL
  Filled 2020-03-21: qty 1

## 2020-03-21 MED ORDER — MIDAZOLAM HCL 2 MG/2ML IJ SOLN
INTRAMUSCULAR | Status: AC
  Filled 2020-03-21: qty 2

## 2020-03-21 MED ORDER — FAMOTIDINE 20 MG PO TABS: 20.0000 mg | ORAL_TABLET | Freq: Once | ORAL | Status: AC

## 2020-03-21 MED ORDER — PROPOFOL 10 MG/ML IV BOLUS
INTRAVENOUS | Status: DC | PRN
  Administered 2020-03-21: 150 mg via INTRAVENOUS

## 2020-03-21 MED ORDER — CHLORHEXIDINE GLUCONATE 0.12 % MT SOLN: 15.0000 mL | Freq: Once | OROMUCOSAL | Status: AC

## 2020-03-21 MED ORDER — LIDOCAINE HCL (CARDIAC) PF 100 MG/5ML IV SOSY
PREFILLED_SYRINGE | INTRAVENOUS | Status: DC | PRN
  Administered 2020-03-21: 100 mg via INTRAVENOUS

## 2020-03-21 MED ORDER — OXYCODONE HCL 5 MG PO TABS
ORAL_TABLET | ORAL | Status: AC
  Filled 2020-03-21: qty 1

## 2020-03-21 MED ORDER — CEFAZOLIN SODIUM-DEXTROSE 2-4 GM/100ML-% IV SOLN
INTRAVENOUS | Status: AC
  Filled 2020-03-21: qty 100

## 2020-03-21 MED ORDER — ORAL CARE MOUTH RINSE: 15.0000 mL | Freq: Once | OROMUCOSAL | Status: AC

## 2020-03-21 MED ORDER — ROCURONIUM BROMIDE 100 MG/10ML IV SOLN
INTRAVENOUS | Status: DC | PRN
  Administered 2020-03-21: 50 mg via INTRAVENOUS

## 2020-03-21 SURGICAL SUPPLY — 32 items
ADH SKN CLS APL DERMABOND .7 (GAUZE/BANDAGES/DRESSINGS)
APL PRP STRL LF DISP 70% ISPRP (MISCELLANEOUS) ×1
BLADE CLIPPER SURG (BLADE) IMPLANT
CANISTER SUCT 1200ML W/VALVE (MISCELLANEOUS) ×2 IMPLANT
CHLORAPREP W/TINT 26 (MISCELLANEOUS) ×2 IMPLANT
COVER WAND RF STERILE (DRAPES) ×2 IMPLANT
DERMABOND ADVANCED (GAUZE/BANDAGES/DRESSINGS)
DERMABOND ADVANCED .7 DNX12 (GAUZE/BANDAGES/DRESSINGS) IMPLANT
DRAPE LAPAROTOMY 100X77 ABD (DRAPES) ×2 IMPLANT
ELECT CAUTERY BLADE 6.4 (BLADE) ×2 IMPLANT
ELECT REM PT RETURN 9FT ADLT (ELECTROSURGICAL) ×2
ELECTRODE REM PT RTRN 9FT ADLT (ELECTROSURGICAL) ×1 IMPLANT
GLOVE ORTHO TXT STRL SZ7.5 (GLOVE) ×2 IMPLANT
GOWN STRL REUS W/ TWL LRG LVL3 (GOWN DISPOSABLE) ×3 IMPLANT
GOWN STRL REUS W/TWL LRG LVL3 (GOWN DISPOSABLE) ×6
KIT TURNOVER KIT A (KITS) ×2 IMPLANT
NEEDLE HYPO 22GX1.5 SAFETY (NEEDLE) ×2 IMPLANT
NS IRRIG 500ML POUR BTL (IV SOLUTION) ×2 IMPLANT
PACK BASIN MINOR (MISCELLANEOUS) ×2 IMPLANT
SPONGE LAP 18X18 RF (DISPOSABLE) ×2 IMPLANT
STAPLER SKIN PROX 35W (STAPLE) IMPLANT
SUT ETHIBOND 0 MO6 C/R (SUTURE) ×2 IMPLANT
SUT MNCRL 4-0 (SUTURE) ×2
SUT MNCRL 4-0 27XMFL (SUTURE) ×1
SUT PROLENE 2 0 SH DA (SUTURE) ×2 IMPLANT
SUT VIC AB 2-0 SH 27 (SUTURE)
SUT VIC AB 2-0 SH 27XBRD (SUTURE) IMPLANT
SUT VIC AB 3-0 SH 27 (SUTURE) ×2
SUT VIC AB 3-0 SH 27X BRD (SUTURE) ×1 IMPLANT
SUTURE MNCRL 4-0 27XMF (SUTURE) ×1 IMPLANT
SYR 20ML LL LF (SYRINGE) ×2 IMPLANT
SYR BULB IRRIG 60ML STRL (SYRINGE) ×2 IMPLANT

## 2020-03-21 NOTE — Anesthesia Postprocedure Evaluation (Signed)
Anesthesia Post Note  Patient: Anita Reed  Procedure(s) Performed: HERNIA REPAIR EPIGASTRIC ADULT, Open (N/A )  Patient location during evaluation: PACU Anesthesia Type: General Level of consciousness: awake and alert Pain management: pain level controlled Vital Signs Assessment: post-procedure vital signs reviewed and stable Respiratory status: spontaneous breathing and respiratory function stable Cardiovascular status: stable Anesthetic complications: no   No complications documented.   Last Vitals:  Vitals:   03/21/20 0913 03/21/20 0915  BP:    Pulse: 69   Resp: (!) 11 (!) 26  Temp:  (!) 36.1 C  SpO2: 95% 96%    Last Pain:  Vitals:   03/21/20 0913  TempSrc:   PainSc: 10-Worst pain ever                 Saylor Murry K

## 2020-03-21 NOTE — Transfer of Care (Signed)
Immediate Anesthesia Transfer of Care Note  Patient: Anita Reed  Procedure(s) Performed: HERNIA REPAIR EPIGASTRIC ADULT, Open (N/A )  Patient Location: PACU  Anesthesia Type:General  Level of Consciousness: sedated  Airway & Oxygen Therapy: Patient Spontanous Breathing and Patient connected to face mask oxygen  Post-op Assessment: Report given to RN and Post -op Vital signs reviewed and stable  Post vital signs: Reviewed and stable  Last Vitals:  Vitals Value Taken Time  BP    Temp    Pulse    Resp    SpO2      Last Pain:  Vitals:   03/21/20 0614  TempSrc: Temporal  PainSc: 7          Complications: No complications documented.

## 2020-03-21 NOTE — Discharge Instructions (Signed)
AMBULATORY SURGERY  °DISCHARGE INSTRUCTIONS ° ° °1) The drugs that you were given will stay in your system until tomorrow so for the next 24 hours you should not: ° °A) Drive an automobile °B) Make any legal decisions °C) Drink any alcoholic beverage ° ° °2) You may resume regular meals tomorrow.  Today it is better to start with liquids and gradually work up to solid foods. ° °You may eat anything you prefer, but it is better to start with liquids, then soup and crackers, and gradually work up to solid foods. ° ° °3) Please notify your doctor immediately if you have any unusual bleeding, trouble breathing, redness and pain at the surgery site, drainage, fever, or pain not relieved by medication. ° ° ° °4) Additional Instructions: ° ° ° ° ° ° ° °Please contact your physician with any problems or Same Day Surgery at [phone], Monday through Friday 6 am to 4 pm, or [facility] [place] [facility] number at [phone]. °

## 2020-03-21 NOTE — Anesthesia Preprocedure Evaluation (Signed)
Anesthesia Evaluation  Patient identified by MRN, date of birth, ID band Patient awake    Reviewed: Allergy & Precautions, NPO status , Patient's Chart, lab work & pertinent test results  History of Anesthesia Complications Negative for: history of anesthetic complications  Airway Mallampati: II       Dental   Pulmonary neg sleep apnea, neg COPD, Current Smoker and Patient abstained from smoking.,           Cardiovascular hypertension, Pt. on medications (-) Past MI and (-) CHF + dysrhythmias Atrial Fibrillation (-) Valvular Problems/Murmurs     Neuro/Psych neg Seizures Depression    GI/Hepatic hiatal hernia, GERD  Medicated and Controlled,(+)     substance abuse (stopped etoh use)  alcohol use, Hepatitis -, C  Endo/Other  neg diabetes  Renal/GU negative Renal ROS     Musculoskeletal   Abdominal   Peds  Hematology   Anesthesia Other Findings   Reproductive/Obstetrics                             Anesthesia Physical Anesthesia Plan  ASA: III  Anesthesia Plan: General   Post-op Pain Management:    Induction: Intravenous  PONV Risk Score and Plan: 2 and Ondansetron and Dexamethasone  Airway Management Planned: Oral ETT  Additional Equipment:   Intra-op Plan:   Post-operative Plan:   Informed Consent: I have reviewed the patients History and Physical, chart, labs and discussed the procedure including the risks, benefits and alternatives for the proposed anesthesia with the patient or authorized representative who has indicated his/her understanding and acceptance.       Plan Discussed with:   Anesthesia Plan Comments:         Anesthesia Quick Evaluation

## 2020-03-21 NOTE — Op Note (Signed)
Epigastric hernia repair  Pre-operative Diagnosis: Epigastric hernia, reducible  Post-operative Diagnosis: same.   Surgeon: [name], M.D., [facility]  Anesthesia: General endotracheal  Findings: As expected empty sac significantly larger than the subcentimeter defect in the supraumbilical linea alba.  Estimated Blood Loss: 2 mL         Specimens: None          Complications: none              Procedure Details  The patient was seen again in the Holding Room. The benefits, complications, treatment options, and expected outcomes were discussed with the patient. The risks of bleeding, infection, recurrence of symptoms, failure to resolve symptoms, unanticipated injury, prosthetic placement, prosthetic infection, any of which could require further surgery were reviewed with the patient. The likelihood of improving the patient's symptoms with return to their baseline status is good.  The patient and/or family concurred with the proposed plan, giving informed consent.  The patient was taken to Operating Room, identified and the procedure verified.    Prior to the induction of general anesthesia, antibiotic prophylaxis was administered. VTE prophylaxis was in place.  General anesthesia was then administered and tolerated well. After the induction, the patient was positioned in the supine position and the abdomen was prepped with Chloraprep and draped in the sterile fashion.  A Time Out was held and the above information confirmed. Local infiltration of Exparel was made to the horizontal mark over the palpable silky mass of the epigastric hernia sac.  Incision was made, and sharp dissection was completed down to the hernia sac.  Hernia sac was isolated from the surrounding tissues, the fascial perimeter was well-defined.  There were some purple discoloration within the sac, considering her history I decided not to excise the sac but to reduce it.  I then proceeded with closure of the fascial defect  with 2 simple sutures of 2-0 Prolene, this obliterated the defect without tension. With hemostasis confirmed I then added extra Exparel to the surrounding fascia and soft tissues.  Total of 20 mL were utilized.  I then closed the skin with a running 4-0 Monocryl subcuticular, and sealed it with Dermabond.  She tolerated procedure well.    [name] M.D., [facility] 03/21/2020 8:21 AM

## 2020-03-21 NOTE — Anesthesia Procedure Notes (Signed)
Procedure Name: Intubation Date/Time: 03/21/2020 7:36 AM Performed by: Debe Coder, CRNA Pre-anesthesia Checklist: Patient identified, Emergency Drugs available, Suction available and Patient being monitored Patient Re-evaluated:Patient Re-evaluated prior to induction Oxygen Delivery Method: Circle system utilized Preoxygenation: Pre-oxygenation with 100% oxygen Induction Type: IV induction Ventilation: Mask ventilation without difficulty Laryngoscope Size: Mac and 4 Grade View: Grade II Tube type: Oral Number of attempts: 1 Airway Equipment and Method: Stylet and Oral airway Placement Confirmation: ETT inserted through vocal cords under direct vision,  positive ETCO2 and breath sounds checked- equal and bilateral Secured at: 21 cm Tube secured with: Tape Dental Injury: Teeth and Oropharynx as per pre-operative assessment

## 2020-03-21 NOTE — Interval H&P Note (Signed)
History and Physical Interval Note:  03/21/2020 7:03 AM  Anita Reed  has presented today for surgery, with the diagnosis of Epigastric hernia.  The various methods of treatment have been discussed with the patient and family. After consideration of risks, benefits and other options for treatment, the patient has consented to  Procedure(s): HERNIA REPAIR EPIGASTRIC ADULT, Open (N/A) as a surgical intervention.  The patient's history has been reviewed, patient examined, no change in status, stable for surgery.  I have reviewed the patient's chart and labs.  Questions were answered to the patient's satisfaction.     [name]

## 2020-03-25 ENCOUNTER — Ambulatory Visit: Payer: Medicare Other

## 2020-03-27 ENCOUNTER — Ambulatory Visit (INDEPENDENT_AMBULATORY_CARE_PROVIDER_SITE_OTHER): Payer: Medicare Other

## 2020-03-27 ENCOUNTER — Other Ambulatory Visit: Payer: Self-pay

## 2020-03-27 DIAGNOSIS — Z0000 Encounter for general adult medical examination without abnormal findings: Secondary | ICD-10-CM | POA: Diagnosis not present

## 2020-03-27 NOTE — Patient Instructions (Signed)
Anita Reed , Thank you for taking time to come for your Medicare Wellness Visit. I appreciate your ongoing commitment to your health goals. Please review the following plan we discussed and let me know if I can assist you in the future.   Screening recommendations/referrals: Colonoscopy: done 06/03/19 Mammogram: postponed Bone Density: due at age 59 Recommended yearly ophthalmology/optometry visit for glaucoma screening and checkup Recommended yearly dental visit for hygiene and checkup  Vaccinations: Influenza vaccine: declined Pneumococcal vaccine: done 05/03/19 Tdap vaccine: due Shingles vaccine: Shingrix discussed. Please contact your pharmacy for coverage information.  Covid-19: dicussed  Advanced directives: Advance directive discussed with you today. Even though you declined this today please call our office should you change your mind and we can give you the proper paperwork for you to fill out.  Conditions/risks identified: Recommend increasing physical activity as tolerated  Next appointment: Follow up in one year for your annual wellness visit.   Preventive Care 40-64 Years, Female Preventive care refers to lifestyle choices and visits with your health care provider that can promote health and wellness. What does preventive care include?  A yearly physical exam. This is also called an annual well check.  Dental exams once or twice a year.  Routine eye exams. Ask your health care provider how often you should have your eyes checked.  Personal lifestyle choices, including:  Daily care of your teeth and gums.  Regular physical activity.  Eating a healthy diet.  Avoiding tobacco and drug use.  Limiting alcohol use.  Practicing safe sex.  Taking low-dose aspirin daily starting at age 65.  Taking vitamin and mineral supplements as recommended by your health care provider. What happens during an annual well check? The services and screenings done by your health  care provider during your annual well check will depend on your age, overall health, lifestyle risk factors, and family history of disease. Counseling  Your health care provider may ask you questions about your:  Alcohol use.  Tobacco use.  Drug use.  Emotional well-being.  Home and relationship well-being.  Sexual activity.  Eating habits.  Work and work Statistician.  Method of birth control.  Menstrual cycle.  Pregnancy history. Screening  You may have the following tests or measurements:  Height, weight, and BMI.  Blood pressure.  Lipid and cholesterol levels. These may be checked every 5 years, or more frequently if you are over 89 years old.  Skin check.  Lung cancer screening. You may have this screening every year starting at age 9 if you have a 30-pack-year history of smoking and currently smoke or have quit within the past 15 years.  Fecal occult blood test (FOBT) of the stool. You may have this test every year starting at age 41.  Flexible sigmoidoscopy or colonoscopy. You may have a sigmoidoscopy every 5 years or a colonoscopy every 10 years starting at age 47.  Hepatitis C blood test.  Hepatitis B blood test.  Sexually transmitted disease (STD) testing.  Diabetes screening. This is done by checking your blood sugar (glucose) after you have not eaten for a while (fasting). You may have this done every 1-3 years.  Mammogram. This may be done every 1-2 years. Talk to your health care provider about when you should start having regular mammograms. This may depend on whether you have a family history of breast cancer.  BRCA-related cancer screening. This may be done if you have a family history of breast, ovarian, tubal, or peritoneal cancers.  Pelvic  exam and Pap test. This may be done every 3 years starting at age 25. Starting at age 19, this may be done every 5 years if you have a Pap test in combination with an HPV test.  Bone density scan. This is  done to screen for osteoporosis. You may have this scan if you are at high risk for osteoporosis. Discuss your test results, treatment options, and if necessary, the need for more tests with your health care provider. Vaccines  Your health care provider may recommend certain vaccines, such as:  Influenza vaccine. This is recommended every year.  Tetanus, diphtheria, and acellular pertussis (Tdap, Td) vaccine. You may need a Td booster every 10 years.  Zoster vaccine. You may need this after age 34.  Pneumococcal 13-valent conjugate (PCV13) vaccine. You may need this if you have certain conditions and were not previously vaccinated.  Pneumococcal polysaccharide (PPSV23) vaccine. You may need one or two doses if you smoke cigarettes or if you have certain conditions. Talk to your health care provider about which screenings and vaccines you need and how often you need them. This information is not intended to replace advice given to you by your health care provider. Make sure you discuss any questions you have with your health care provider. Document Released: 09/12/2015 Document Revised: 05/05/2016 Document Reviewed: 06/17/2015 Elsevier Interactive Patient Education  2017 [employer].    Fall Prevention in the Home Falls can cause injuries. They can happen to people of all ages. There are many things you can do to make your home safe and to help prevent falls. What can I do on the outside of my home?  Regularly fix the edges of walkways and driveways and fix any cracks.  Remove anything that might make you trip as you walk through a door, such as a raised step or threshold.  Trim any bushes or trees on the path to your home.  Use bright outdoor lighting.  Clear any walking paths of anything that might make someone trip, such as rocks or tools.  Regularly check to see if handrails are loose or broken. Make sure that both sides of any steps have handrails.  Any raised decks and  porches should have guardrails on the edges.  Have any leaves, snow, or ice cleared regularly.  Use sand or salt on walking paths during winter.  Clean up any spills in your garage right away. This includes oil or grease spills. What can I do in the bathroom?  Use night lights.  Install grab bars by the toilet and in the tub and shower. Do not use towel bars as grab bars.  Use non-skid mats or decals in the tub or shower.  If you need to sit down in the shower, use a plastic, non-slip stool.  Keep the floor dry. Clean up any water that spills on the floor as soon as it happens.  Remove soap buildup in the tub or shower regularly.  Attach bath mats securely with double-sided non-slip rug tape.  Do not have throw rugs and other things on the floor that can make you trip. What can I do in the bedroom?  Use night lights.  Make sure that you have a light by your bed that is easy to reach.  Do not use any sheets or blankets that are too big for your bed. They should not hang down onto the floor.  Have a firm chair that has side arms. You can use this for support  while you get dressed.  Do not have throw rugs and other things on the floor that can make you trip. What can I do in the kitchen?  Clean up any spills right away.  Avoid walking on wet floors.  Keep items that you use a lot in easy-to-reach places.  If you need to reach something above you, use a strong step stool that has a grab bar.  Keep electrical cords out of the way.  Do not use floor polish or wax that makes floors slippery. If you must use wax, use non-skid floor wax.  Do not have throw rugs and other things on the floor that can make you trip. What can I do with my stairs?  Do not leave any items on the stairs.  Make sure that there are handrails on both sides of the stairs and use them. Fix handrails that are broken or loose. Make sure that handrails are as long as the stairways.  Check any  carpeting to make sure that it is firmly attached to the stairs. Fix any carpet that is loose or worn.  Avoid having throw rugs at the top or bottom of the stairs. If you do have throw rugs, attach them to the floor with carpet tape.  Make sure that you have a light switch at the top of the stairs and the bottom of the stairs. If you do not have them, ask someone to add them for you. What else can I do to help prevent falls?  Wear shoes that:  Do not have high heels.  Have rubber bottoms.  Are comfortable and fit you well.  Are closed at the toe. Do not wear sandals.  If you use a stepladder:  Make sure that it is fully opened. Do not climb a closed stepladder.  Make sure that both sides of the stepladder are locked into place.  Ask someone to hold it for you, if possible.  Clearly mark and make sure that you can see:  Any grab bars or handrails.  First and last steps.  Where the edge of each step is.  Use tools that help you move around (mobility aids) if they are needed. These include:  Canes.  Walkers.  Scooters.  Crutches.  Turn on the lights when you go into a dark area. Replace any light bulbs as soon as they burn out.  Set up your furniture so you have a clear path. Avoid moving your furniture around.  If any of your floors are uneven, fix them.  If there are any pets around you, be aware of where they are.  Review your medicines with your doctor. Some medicines can make you feel dizzy. This can increase your chance of falling. Ask your doctor what other things that you can do to help prevent falls. This information is not intended to replace advice given to you by your health care provider. Make sure you discuss any questions you have with your health care provider. Document Released: 06/12/2009 Document Revised: 01/22/2016 Document Reviewed: 09/20/2014 Elsevier Interactive Patient Education  2017 [employer].

## 2020-03-27 NOTE — Progress Notes (Signed)
Subjective:   Anita Reed is a 59 y.o. female who presents for an Initial Medicare Annual Wellness Visit.  Virtual Visit via Telephone Note  I connected with  Anita Reed on 03/27/20 at  3:30 PM EDT by telephone and verified that I am speaking with the correct person using two identifiers.  Medicare Annual Wellness visit completed telephonically due to Covid-19 pandemic.   Location: Patient: home Provider: office   I discussed the limitations, risks, security and privacy concerns of performing an evaluation and management service by telephone and the availability of in person appointments. The patient expressed understanding and agreed to proceed.  Unable to perform video visit due to video visit attempted and failed and/or patient does not have video capability.   Some vital signs may be absent or patient reported.   Clemetine Marker, LPN    Review of Systems     Cardiac Risk Factors include: advanced age (>18men, >57 women);hypertension;sedentary lifestyle     Objective:    Today's Vitals   03/27/20 1628  PainSc: 7    There is no height or weight on file to calculate BMI.  Advanced Directives 03/27/2020 03/21/2020 03/12/2020 08/27/2019 07/14/2019 06/22/2019 06/22/2019  Does Patient Have a Medical Advance Directive? No No Yes No No No No  Type of Advance Directive - - Out of facility DNR (pink MOST or yellow form) - - - -  Would patient like information on creating a medical advance directive? No - Patient declined No - Patient declined - No - Patient declined No - Patient declined No - Patient declined No - Patient declined    Current Medications (verified) Outpatient Encounter Medications as of 03/27/2020  Medication Sig  . digoxin (LANOXIN) 0.125 MG tablet Take 1 tablet (125 mcg total) by mouth daily.  Anita Reed FLUoxetine (PROZAC) 20 MG tablet Take 1 tablet (20 mg total) by mouth daily.  . furosemide (LASIX) 40 MG tablet Take 0.5 tablets (20 mg total) by mouth daily.  Anita Reed  gabapentin (NEURONTIN) 400 MG capsule Take 1 capsule (400 mg total) by mouth 3 (three) times daily.  Anita Reed lactulose (CHRONULAC) 10 GM/15ML solution Take 45 mLs (30 g total) by mouth 3 (three) times daily.  . metoprolol succinate (TOPROL-XL) 25 MG 24 hr tablet Take 25 mg by mouth daily.  . midodrine (PROAMATINE) 5 MG tablet Take 1 tablet (5 mg total) by mouth 3 (three) times daily with meals.  . pantoprazole (PROTONIX) 20 MG tablet Take 1 tablet (20 mg total) by mouth every morning. One hour before breakfast  . tiZANidine (ZANAFLEX) 4 MG capsule TAKE 1 CAPSULE BY MOUTH THREE TIMES DAILY AS NEEDED FOR MUSCLE SPASM (Patient taking differently: Take 4 mg by mouth 3 (three) times daily. )  . fluconazole (DIFLUCAN) 150 MG tablet Take 1 tablet (150 mg total) by mouth every 3 (three) days as needed (for vaginal itching/yeast infection sx). (Patient not taking: Reported on 03/07/2020)  . [DISCONTINUED] oxyCODONE (OXY IR/ROXICODONE) 5 MG immediate release tablet Take 1 tablet (5 mg total) by mouth every 6 (six) hours as needed for severe pain.   No facility-administered encounter medications on file as of 03/27/2020.    Allergies (verified) Patient has no known allergies.   History: Past Medical History:  Diagnosis Date  . Anemia   . Atrial flutter ([place])   . Cancer [facility])    lymphoma 11 yrs. ago  . Cirrhosis ([place])   . Dysrhythmia   . Esophageal varices ([place])   . GERD (gastroesophageal reflux  disease)   . Hepatitis    hep C-resolved  . History of hiatal hernia   . Hypertension   . Pneumonia   . Portal hypertension ([facility])    Past Surgical History:  Procedure Laterality Date  . ABDOMINAL HYSTERECTOMY    . APPENDECTOMY    . COLONOSCOPY N/A 06/03/2019   Procedure: COLONOSCOPY;  Surgeon: Toledo, Benay Pike, MD;  Location: [facility] ENDOSCOPY;  Service: Gastroenterology;  Laterality: N/A;  . EPIGASTRIC HERNIA REPAIR N/A 03/21/2020   Procedure: HERNIA REPAIR EPIGASTRIC ADULT, Open;  Surgeon: [name],  MD;  Location: [facility] ORS;  Service: General;  Laterality: N/A;  . ESOPHAGOGASTRODUODENOSCOPY N/A 01/29/2019   Procedure: ESOPHAGOGASTRODUODENOSCOPY (EGD);  Surgeon: [name], MD;  Location: [facility] ENDOSCOPY;  Service: Gastroenterology;  Laterality: N/A;  . ESOPHAGOGASTRODUODENOSCOPY N/A 06/01/2019   Procedure: ESOPHAGOGASTRODUODENOSCOPY (EGD);  Surgeon: Toledo, Benay Pike, MD;  Location: [facility] ENDOSCOPY;  Service: Gastroenterology;  Laterality: N/A;  . IR PARACENTESIS  08/27/2019  . IR RADIOLOGIST EVAL & MGMT  08/08/2019  . IR RADIOLOGIST EVAL & MGMT  11/14/2019  . IR TIPS  08/27/2019  . RADIOLOGY WITH ANESTHESIA N/A 08/27/2019   Procedure: IR WITH ANESTHESIA TIPS PROCEDURE;  Surgeon: [name], MD;  Location: [place];  Service: Radiology;  Laterality: N/A;  . TONSILLECTOMY     Family History  Problem Relation Age of Onset  . Cancer Mother   . Cancer Father    Social History   Socioeconomic History  . Marital status: Married    Spouse name: [name]  . Number of children: 1  . Years of education: 69  . Highest education level: 9th grade  Occupational History  . Occupation: disabled  Tobacco Use  . Smoking status: Current Every Day Smoker    Packs/day: 1.00    Years: 35.00    Pack years: 35.00    Types: Cigarettes  . Smokeless tobacco: Never Used  Vaping Use  . Vaping Use: Never used  Substance and Sexual Activity  . Alcohol use: Not Currently  . Drug use: Never  . Sexual activity: Not Currently  Other Topics Concern  . Not on file  Social History Narrative  . Not on file   Social Determinants of Health   Financial Resource Strain: Low Risk   . Difficulty of Paying Living Expenses: Not very hard  Food Insecurity: No Food Insecurity  . Worried About Charity fundraiser in the Last Year: Never true  . Ran Out of Food in the Last Year: Never true  Transportation Needs: No Transportation Needs  . Lack of Transportation (Medical): No  . Lack of Transportation  (Non-Medical): No  Physical Activity: Inactive  . Days of Exercise per Week: 0 days  . Minutes of Exercise per Session: 0 min  Stress: No Stress Concern Present  . Feeling of Stress : Only a little  Social Connections: Moderately Isolated  . Frequency of Communication with Friends and Family: More than three times a week  . Frequency of Social Gatherings with Friends and Family: Never  . Attends Religious Services: Never  . Active Member of Clubs or Organizations: No  . Attends Archivist Meetings: Never  . Marital Status: Married    Tobacco Counseling Ready to quit: Not Answered Counseling given: Not Answered   Clinical Intake:  Pre-visit preparation completed: Yes  Pain : 0-10 Pain Score: 7  Pain Type: Acute pain, Other (Comment) (s/p hernia surgery) Pain Location: Abdomen Pain Descriptors / Indicators: Sore, Aching Pain Onset:  In the past 7 days Pain Frequency: Constant     Nutritional Risks: None Diabetes: No  How often do you need to have someone help you when you read instructions, pamphlets, or other written materials from your doctor or pharmacy?: 1 - Never    Interpreter Needed?: No  Information entered by :: Clemetine Marker LPN   Activities of Daily Living In your present state of health, do you have any difficulty performing the following activities: 03/27/2020 03/12/2020  Hearing? N N  Comment declines hearing aids -  Vision? N N  Difficulty concentrating or making decisions? N N  Walking or climbing stairs? N N  Dressing or bathing? N N  Doing errands, shopping? N N  Preparing Food and eating ? N -  Using the Toilet? N -  In the past six months, have you accidently leaked urine? N -  Do you have problems with loss of bowel control? N -  Managing your Medications? N -  Managing your Finances? N -  Housekeeping or managing your Housekeeping? N -  Some recent data might be hidden    Patient Care Team: Towanda Malkin, MD as PCP -  General (Internal Medicine) Minna Merritts, MD as PCP - Cardiology (Cardiology)  Indicate any recent Medical Services you may have received from other than [facility] providers in the past year (date may be approximate).     Assessment:   This is a routine wellness examination for [place].  Hearing/Vision screen  Hearing Screening   125Hz  250Hz  500Hz  1000Hz  2000Hz  3000Hz  4000Hz  6000Hz  8000Hz   Right ear:           Left ear:           Comments: Pt denies hearing difficulty  Vision Screening Comments: Annual vision screenings completed per patient but she does not recall name or location  Dietary issues and exercise activities discussed: Current Exercise Habits: The patient does not participate in regular exercise at present, Exercise limited by: None identified  Goals    .  " I need to know where to get help with my medical bills" (pt-stated)      Current Barriers:  . Financial constraints  Clinical Social Work Clinical Goal(s):  Anita Reed Over the next 90 days, client will work with SW to address concerns related to applying for food stamps and financial hardship to address unpaid medical bills  Interventions: . Patient interviewed and appropriate assessments performed . Patient confirmed that she has received the food stamp application that was sent for her signature, however has decided against submitting it as well as the hardship application due to her spouse not wanting to provide his income information. . Patient agrees to contact the business office at the hospital to make payment arrangements for outstanding medical bills . Assessed for safety concerns related to patient and her spouse(patient denies domestic violence at this time) and offered support services if needed in the future.   Patient Self Care Activities:  . Self administers medications as prescribed . Performs ADL's independently . Performs IADL's independently . Calls provider office for new concerns or  questions  Please see past updates related to this goal by clicking on the "Past Updates" button in the selected goal        Depression Screen PHQ 2/9 Scores 03/27/2020 02/26/2020 12/20/2019 11/28/2019 09/10/2019 08/13/2019 07/27/2019  PHQ - 2 Score 0 0 0 0 0 0 2  PHQ- 9 Score - 2 0 0 0 0 2  Fall Risk Fall Risk  03/27/2020 03/06/2020 02/26/2020 12/20/2019 11/28/2019  Falls in the past year? 0 0 1 0 0  Number falls in past yr: 0 0 1 0 0  Injury with Fall? 0 0 0 0 0  Risk for fall due to : History of fall(s);Impaired balance/gait;Medication side effect - History of fall(s);Impaired balance/gait - -  Follow up - - - - -    Any stairs in or around the home? Yes  If so, are there any without handrails? Yes  Home free of loose throw rugs in walkways, pet beds, electrical cords, etc? Yes  Adequate lighting in your home to reduce risk of falls? Yes   ASSISTIVE DEVICES UTILIZED TO PREVENT FALLS:  Life alert? No  Use of a cane, walker or w/c? No  Grab bars in the bathroom? Yes  Shower chair or bench in shower? No  Elevated toilet seat or a handicapped toilet? No   TIMED UP AND GO:  Was the test performed? No . Telephonic visi.   Cognitive Function: pt declines 6CIT for 2021 AWV; not feeling well today        Immunizations Immunization History  Administered Date(s) Administered  . Hep A / Hep B 02/09/2019, 05/03/2019, 09/24/2019  . Pneumococcal Conjugate-13 02/09/2019  . Pneumococcal Polysaccharide-23 05/03/2019    TDAP status: Due, Education has been provided regarding the importance of this vaccine. Advised may receive this vaccine at local pharmacy or Health Dept. Aware to provide a copy of the vaccination record if obtained from local pharmacy or Health Dept. Verbalized acceptance and understanding.   Flu Vaccine status: Declined, Education has been provided regarding the importance of this vaccine but patient still declined. Advised may receive this vaccine at local pharmacy or  Health Dept. Aware to provide a copy of the vaccination record if obtained from local pharmacy or Health Dept. Verbalized acceptance and understanding.   Pneumococcal vaccine status: Up to date   Covid-19 vaccine status: Declined, Education has been provided regarding the importance of this vaccine but patient still declined. Advised may receive this vaccine at local pharmacy or Health Dept.or vaccine clinic. Aware to provide a copy of the vaccination record if obtained from local pharmacy or Health Dept. Verbalized acceptance and understanding.  Qualifies for Shingles Vaccine? Yes   Zostavax completed No   Shingrix Completed?: No.    Education has been provided regarding the importance of this vaccine. Patient has been advised to call insurance company to determine out of pocket expense if they have not yet received this vaccine. Advised may also receive vaccine at local pharmacy or Health Dept. Verbalized acceptance and understanding.  Screening Tests Health Maintenance  Topic Date Due  . COVID-19 Vaccine (1) Never done  . MAMMOGRAM  11/27/2020 (Originally 08/05/2011)  . TETANUS/TDAP  11/27/2020 (Originally 08/04/1980)  . INFLUENZA VACCINE  03/30/2020  . PAP SMEAR-Modifier  04/01/2022  . COLONOSCOPY  06/02/2029  . Hepatitis C Screening  Completed  . HIV Screening  Completed    Health Maintenance  Health Maintenance Due  Topic Date Due  . COVID-19 Vaccine (1) Never done    Colorectal cancer screening: Completed 06/03/19. Repeat every 10 years   Mammogram: declines  Bone Density: due at age 56  Lung Cancer Screening: (Low Dose CT Chest recommended if Age 39-80 years, 30 pack-year currently smoking OR have quit w/in 15years.) does qualify. Pt declines.  Additional Screening:  Hepatitis C Screening: does qualify; Completed 01/18/19  Vision Screening: Recommended annual ophthalmology exams  for early detection of glaucoma and other disorders of the eye. Is the patient up to date  with their annual eye exam?  Yes  Who is the provider or what is the name of the office in which the patient attends annual eye exams? Pt does not recall name or location of eye provider  Dental Screening: Recommended annual dental exams for proper oral hygiene  Community Resource Referral / Chronic Care Management: CRR required this visit?  No   CCM required this visit?  No      Plan:     I have personally reviewed and noted the following in the patient's chart:   . Medical and social history . Use of alcohol, tobacco or illicit drugs  . Current medications and supplements . Functional ability and status . Nutritional status . Physical activity . Advanced directives . List of other physicians . Hospitalizations, surgeries, and ER visits in previous 12 months . Vitals . Screenings to include cognitive, depression, and falls . Referrals and appointments  In addition, I have reviewed and discussed with patient certain preventive protocols, quality metrics, and best practice recommendations. A written personalized care plan for preventive services as well as general preventive health recommendations were provided to patient.   Due to this being a telephonic visit, the after visit summary with patients personalized plan was offered to patient via mail or my-chart. Patient declined at this time.  Clemetine Marker, LPN   [id number]   Nurse Notes: pt s/p hernia surgery from 03/21/20. Pt states she is doing okay but still having some pain. Pt scheduled to follow up with Dr. Olean Ree for post op.

## 2020-04-03 ENCOUNTER — Other Ambulatory Visit: Payer: Self-pay

## 2020-04-03 ENCOUNTER — Encounter: Payer: Self-pay | Admitting: Physician Assistant

## 2020-04-03 ENCOUNTER — Ambulatory Visit (INDEPENDENT_AMBULATORY_CARE_PROVIDER_SITE_OTHER): Payer: Medicare Other | Admitting: Physician Assistant

## 2020-04-03 VITALS — BP 140/83 | HR 88 | Temp 98.0°F | Ht 69.0 in | Wt 147.0 lb

## 2020-04-03 DIAGNOSIS — K439 Ventral hernia without obstruction or gangrene: Secondary | ICD-10-CM

## 2020-04-03 DIAGNOSIS — Z09 Encounter for follow-up examination after completed treatment for conditions other than malignant neoplasm: Secondary | ICD-10-CM

## 2020-04-03 NOTE — Progress Notes (Signed)
[facility] POST-OP OFFICE VISIT  04/03/2020  HPI: Anita Reed is a 59 y.o. female 13 days s/p epigastric hernia repair with Dr Christian Mate.  She is doing well Only taking Advil for pain No fever, chills, nausea, or emesis Incision is healing well  She additionally endorsed to me symptoms of dysphagia. She described this as saying every time she eats or drinks anything she feels like it "gets stuck in her throat and sometimes comes back up." This is embarrassing for her, and she would like evaluation for this. She has seen Dr Marius Ditch in the past.   Vital signs: BP 140/83   Pulse 88   Temp 98 F (36.7 C)   Ht 5\' 9"  (1.753 m)   Wt 147 lb (66.7 kg)   SpO2 94%   BMI 21.71 kg/m    Physical Exam: Constitutional: Well appearing female, NAD Abdomen: Soft, non-tender, non-distended, no rebound/guarding Skin: Epigastric incision is well healed, no erythema or drainage, there is some induration and ecchymosis in various healing stages  Assessment/Plan: This is a 59 y.o. female 13 days s/p epigastric hernia repair   - She will plan to return to Dr Verlin Grills clinic for evaluation of dysphagia and assess for EGD need   - Continue Advil prn for pain; avoid tylenol secondary to hepatic disease  - Reviewed wound care and lifting restrictions  - rtc prn  -- Edison Simon, PA-C [facility] 04/03/2020, 10:19 AM [phone] M-F: 7am - 4pm

## 2020-04-03 NOTE — Patient Instructions (Addendum)
May take Motrin, Advil, or Ibuprofen for pain. May also use a heating pad to the area for comfort. May use Benadryl or Cortizone cream for the itching.   Call [facility] to be seen for swallowing problems.   GENERAL POST-OPERATIVE PATIENT INSTRUCTIONS   WOUND CARE INSTRUCTIONS:  Keep a dry clean dressing on the wound if there is drainage. The initial bandage may be removed after 24 hours.  Once the wound has quit draining you may leave it open to air.  If clothing rubs against the wound or causes irritation and the wound is not draining you may cover it with a dry dressing during the daytime.  Try to keep the wound dry and avoid ointments on the wound unless directed to do so.  If the wound becomes bright red and painful or starts to drain infected material that is not clear, please contact your physician immediately.  If the wound is mildly pink and has a thick firm ridge underneath it, this is normal, and is referred to as a healing ridge.  This will resolve over the next 4-6 weeks.  BATHING: You may shower if you have been informed of this by your surgeon. However, Please do not submerge in a tub, hot tub, or pool until incisions are completely sealed or have been told by your surgeon that you may do so.  DIET:  You may eat any foods that you can tolerate.  It is a good idea to eat a high fiber diet and take in plenty of fluids to prevent constipation.  If you do become constipated you may want to take a mild laxative or take ducolax tablets on a daily basis until your bowel habits are regular.  Constipation can be very uncomfortable, along with straining, after recent surgery.  ACTIVITY:  You are encouraged to cough and deep breath or use your incentive spirometer if you were given one, every 15-30 minutes when awake.  This will help prevent respiratory complications and low grade fevers post-operatively if you had a general anesthetic.  You may want to hug a pillow when coughing and sneezing to  add additional support to the surgical area, if you had abdominal or chest surgery, which will decrease pain during these times.  You are encouraged to walk and engage in light activity for the next two weeks.  You should not lift more than 20 pounds for 4-6 weeks after surgery as it could put you at increased risk for complications.  Twenty pounds is roughly equivalent to a plastic bag of groceries. At that time- Listen to your body when lifting, if you have pain when lifting, stop and then try again in a few days. Soreness after doing exercises or activities of daily living is normal as you get back in to your normal routine.  MEDICATIONS:  Try to take narcotic medications and anti-inflammatory medications, such as tylenol, ibuprofen, naprosyn, etc., with food.  This will minimize stomach upset from the medication.  Should you develop nausea and vomiting from the pain medication, or develop a rash, please discontinue the medication and contact your physician.  You should not drive, make important decisions, or operate machinery when taking narcotic pain medication.  SUNBLOCK Use sun block to incision area over the next year if this area will be exposed to sun. This helps decrease scarring and will allow you avoid a permanent darkened area over your incision.  QUESTIONS:  Please feel free to call our office if you have any questions,  and we will be glad to assist you.

## 2020-04-07 ENCOUNTER — Other Ambulatory Visit: Payer: Self-pay | Admitting: Internal Medicine

## 2020-04-07 DIAGNOSIS — G8929 Other chronic pain: Secondary | ICD-10-CM

## 2020-04-07 DIAGNOSIS — M62838 Other muscle spasm: Secondary | ICD-10-CM

## 2020-04-07 MED ORDER — TIZANIDINE HCL 4 MG PO CAPS: ORAL_CAPSULE | ORAL | 1 refills | Status: DC

## 2020-04-07 NOTE — Telephone Encounter (Signed)
tiZANidine (ZANAFLEX) 4 MG capsule Pt also requested a refill of this for the same pharmacy

## 2020-05-22 ENCOUNTER — Other Ambulatory Visit: Payer: Self-pay | Admitting: Cardiovascular Disease

## 2020-05-22 NOTE — Telephone Encounter (Signed)
LMOV to schedule  

## 2020-05-22 NOTE — Telephone Encounter (Signed)
Please schedule 6 month F/U appointment. Thank you! 

## 2020-05-27 NOTE — Progress Notes (Signed)
Patient ID: Anita Reed, female    DOB: 1960/12/24, 59 y.o.   MRN: [id number]  PCP: Towanda Malkin, MD  No chief complaint on file.   Subjective:   Anita Reed is a 59 y.o. female, presents to clinic with CC of the following:  No chief complaint on file.   HPI:  Patient is a 59 y.o female Prior telemed visit with me in March 2021 Last saw Delsa Grana 02/26/20 with the following complaints  Recurrent thrush and sores in mouth raw and red, frequent sores, worsening Visit 2 months ago she complained of the same and diflucan did effectively tx and throat was not as red then She has B12 deficiency and is on a supplement, last B12 check was high. Umbilical hernia sliding in and out when lifting things, is tender, gets to the size of the palm of her hand, she previously discussed with her PCP emily boyce  She had the hernia repaired surgically and no concerns on f/u 04/03/2020 with surgery, but did note dysphagia sx's and recommended f/u with GI, Dr. Marius Ditch.  Follows up today She notes she has been having significant pain in the past couple weeks, more so in the past few days.  She states that she has bad stomach pains, has been vomiting, has lost significant weight, has been absolutely exhausted, and has bad neuropathic pain to the point that she cannot walk often.  The pain is felt mostly from her calf down to her feet.  She has a significant history of liver disease and a GI bleed history.  Weight loss/decreased energy levels -she notes despite trying to eat, she has been losing weight.  Her appetite has been down.  Notes stamina decreased and not eating as much. Has been two weeks now. Did have some vomiting for a few days, although noted not in past 24 hours, no diarrhea, no blood with vomiting or bowel movements. No other bleeding, does note some fairly easy bruising (states has a puppy dog that is very active with her).  She notes the last time she felt like this she  needed blood products.  Wt Readings from Last 3 Encounters:  05/28/20 127 lb (57.6 kg)  04/03/20 147 lb (66.7 kg)  03/21/20 133 lb 13.1 oz (60.7 kg)     Hypotension/ A flutter hx - Last saw cardiology 10/29/2019 It was recommended starting metoprolol for her flutter, and increasing her Lasix to once daily On 05/22/20, she was contacted to schedule another f/u with cardiology  Neuropathy,chronic pain, and muscle spasticity:Notes is worse in the recent past, feels in feet up the calves. She is prescribed to takegabapentin 400mg TID.  She is also taking 4mg  Tizandidine TID for her pain and ongoinglower extremitymuscle spasms(worse when her BLE edema worsens).Has taken more recently. Takes two gabapentin and two tizandidine twice daily, sometimes an extra dose midday, and often cannot walk due to the pain.  She did note that she needs these for her pain control, as she simply cannot function without that to help Previously, it was discussed about having pain management providers to help as was entertained in the past, and not wanted with cost an issue. With patient's hepatic impairment, noted the need to proceed with great caution, and Dr. Jolly Mango, the gastroenterologist was consulted about increasing the tizanidine dose in the past, and it was recommended not to increase that dose any further.  She has been taking higher than the prescribed doses of the medications in the past  few days due to her pain.   DecompensatedCirrhosis with ascitisand hx GI bleed- She had TIPS procedures 08/27/2019 and her abdominal swelling has been much improved. She has ongoing BLE edema up to her knees. Taking lactulose with discussion of changing to rifaximin on last GI follow-up although concern noted by patient about cost, as when she filled, she noted it was over thousand dollars, and did not fill.    lactulose use only very rarely now, Uses lasix daily Overall doing much better since procedure. .    Depression- She was started on Prozac 10mg , and felt it has been improving her mood, was increased to 20 mg daily.  Her phq9 today was reviewed,  She did note she went from taking no medicines, to then the cirrhosis diagnosis, which was a surprise for her as she stated she did not drink much alcohol, and has gone through a lot.  She states she is dealing with it.   Patient Active Problem List   Diagnosis Date Noted  . Epigastric hernia 03/06/2020  . Major depressive disorder with single episode, in partial remission ([place]) 11/28/2019  . Neuropathy 11/28/2019  . Dysthymia 07/27/2019  . Other chronic pain 07/27/2019  . Muscle spasticity 07/27/2019  . C. difficile colitis   . Hypokalemia 07/15/2019  . Bandemia 07/15/2019  . Hypotension   . Goals of care, counseling/discussion   . Palliative care encounter   . Muscle cramping 06/22/2019  . Alcohol abuse 06/22/2019  . Sepsis ([place]) 06/22/2019  . GIB (gastrointestinal bleeding) 06/01/2019  . GI bleeding 06/01/2019  . History of lymphoma 02/26/2019  . Atrial flutter ([place]) 02/07/2019  . Alcoholic cirrhosis of liver with ascites ([place]) 02/07/2019  . Persistent atrial fibrillation ([place]) 02/07/2019  . Anemia 02/07/2019  . Anorexia 02/07/2019  . GI bleed 01/28/2019  . Ascites 01/18/2019  . Cancer [facility])       Current Outpatient Medications:  .  digoxin (LANOXIN) 0.125 MG tablet, Take 1 tablet by mouth once daily, Disp: 90 tablet, Rfl: 0 .  FLUoxetine (PROZAC) 20 MG tablet, Take 1 tablet (20 mg total) by mouth daily., Disp: 30 tablet, Rfl: 3 .  furosemide (LASIX) 40 MG tablet, Take 0.5 tablets (20 mg total) by mouth daily., Disp: 15 tablet, Rfl: 3 .  gabapentin (NEURONTIN) 400 MG capsule, TAKE 1 CAPSULE BY MOUTH THREE TIMES DAILY, Disp: 270 capsule, Rfl: 0 .  lactulose (CHRONULAC) 10 GM/15ML solution, Take 45 mLs (30 g total) by mouth 3 (three) times daily., Disp: 236 mL, Rfl: 2 .  metoprolol succinate (TOPROL-XL) 25 MG 24 hr tablet,  Take 25 mg by mouth daily., Disp: , Rfl:  .  midodrine (PROAMATINE) 5 MG tablet, Take 1 tablet (5 mg total) by mouth 3 (three) times daily with meals., Disp: 270 tablet, Rfl: 0 .  pantoprazole (PROTONIX) 20 MG tablet, Take 1 tablet (20 mg total) by mouth every morning. One hour before breakfast, Disp: 30 tablet, Rfl: 1 .  tiZANidine (ZANAFLEX) 4 MG capsule, TAKE 1 CAPSULE BY MOUTH THREE TIMES DAILY AS NEEDED FOR MUSCLE SPASM, Disp: 270 capsule, Rfl: 1   No Known Allergies   Past Surgical History:  Procedure Laterality Date  . ABDOMINAL HYSTERECTOMY    . APPENDECTOMY    . COLONOSCOPY N/A 06/03/2019   Procedure: COLONOSCOPY;  Surgeon: Toledo, Benay Pike, MD;  Location: [facility] ENDOSCOPY;  Service: Gastroenterology;  Laterality: N/A;  . EPIGASTRIC HERNIA REPAIR N/A 03/21/2020   Procedure: HERNIA REPAIR EPIGASTRIC ADULT, Open;  Surgeon: [name],  MD;  Location: [facility] ORS;  Service: General;  Laterality: N/A;  . ESOPHAGOGASTRODUODENOSCOPY N/A 01/29/2019   Procedure: ESOPHAGOGASTRODUODENOSCOPY (EGD);  Surgeon: [name], MD;  Location: [facility] ENDOSCOPY;  Service: Gastroenterology;  Laterality: N/A;  . ESOPHAGOGASTRODUODENOSCOPY N/A 06/01/2019   Procedure: ESOPHAGOGASTRODUODENOSCOPY (EGD);  Surgeon: Toledo, Benay Pike, MD;  Location: [facility] ENDOSCOPY;  Service: Gastroenterology;  Laterality: N/A;  . IR PARACENTESIS  08/27/2019  . IR RADIOLOGIST EVAL & MGMT  08/08/2019  . IR RADIOLOGIST EVAL & MGMT  11/14/2019  . IR TIPS  08/27/2019  . RADIOLOGY WITH ANESTHESIA N/A 08/27/2019   Procedure: IR WITH ANESTHESIA TIPS PROCEDURE;  Surgeon: [name], MD;  Location: [place];  Service: Radiology;  Laterality: N/A;  . TONSILLECTOMY       Family History  Problem Relation Age of Onset  . Cancer Mother   . Cancer Father      Social History   Tobacco Use  . Smoking status: Current Every Day Smoker    Packs/day: 1.00    Years: 35.00    Pack years: 35.00    Types: Cigarettes  . Smokeless  tobacco: Never Used  Substance Use Topics  . Alcohol use: Not Currently    With staff assistance, above reviewed with the patient today.  ROS: As per HPI, otherwise no specific complaints on a limited and focused system review   No results found for this or any previous visit (from the past 72 hour(s)).   PHQ2/9: Depression screen [facility] 2/9 05/28/2020 03/27/2020 02/26/2020 12/20/2019 11/28/2019  Decreased Interest 0 0 0 0 0  Down, Depressed, Hopeless 0 0 0 0 0  PHQ - 2 Score 0 0 0 0 0  Altered sleeping - - 1 0 0  Tired, decreased energy - - 0 0 0  Change in appetite - - 0 0 0  Feeling bad or failure about yourself  - - 1 0 0  Trouble concentrating - - 0 0 0  Moving slowly or fidgety/restless - - 0 0 0  Suicidal thoughts - - 0 0 0  PHQ-9 Score - - 2 0 0  Difficult doing work/chores - - Somewhat difficult Not difficult at all Not difficult at all  Some recent data might be hidden   PHQ-2/9 Result is neg  Fall Risk: Fall Risk  05/28/2020 03/27/2020 03/06/2020 02/26/2020 12/20/2019  Falls in the past year? 0 0 0 1 0  Number falls in past yr: 0 0 0 1 0  Injury with Fall? 0 0 0 0 0  Risk for fall due to : - History of fall(s);Impaired balance/gait;Medication side effect - History of fall(s);Impaired balance/gait -  Follow up Falls evaluation completed - - - -      Objective:   Vitals:   05/28/20 1325  Height: 5\' 9"  (1.753 m)    Body mass index is 21.71 kg/m.  Physical Exam   NAD, masked, thin appearing HEENT - [place]/AT, questioned if her sclera were minimally icteric, PERRL, EOMI, conj - non-inj'ed, pharynx erythematous without exudates  Neck - supple, no adenopathy, carotids 2+ and = without bruits bilat Car -tachycardic and regular, with pulse approximately 104 on my exam Pulm- RR and effort normal at rest, CTA without wheeze or rales Abd - soft, very tender to palpate the upper quadrants, much more the right upper quadrant with guarding evident.  Difficult to assess for rebound  giving her discomfort, bowel sounds were positive, nondistended Back - no focal CVA tenderness Skin-scattered bruises noted on the  upper extremity more left than right Ext - no LE edema,  Neuro/psychiatric - affect was not flat, appropriate with conversation  Alert  Grossly non-focal   Speech normal   Results for orders placed or performed during the hospital encounter of 03/21/20  I-STAT, chem 8  Result Value Ref Range   Sodium 141 135 - 145 mmol/L   Potassium 3.8 3.5 - 5.1 mmol/L   Chloride 103 98 - 111 mmol/L   BUN 19 6 - 20 mg/dL   Creatinine, Ser 0.90 0.44 - 1.00 mg/dL   Glucose, Bld 108 (H) 70 - 99 mg/dL   Calcium, Ion 1.17 1.15 - 1.40 mmol/L   TCO2 25 22 - 32 mmol/L   Hemoglobin 13.9 12.0 - 15.0 g/dL   HCT 41.0 36 - 46 %       Assessment & Plan:   Weight loss N/V, Abdominal pain, Exhaustion Discussed concerns with patient at length.  With the recent vomiting, stomach pains, weight loss, exhaustion, and increased neuropathic pain with a history of significant liver disease and GI bleed history, I do feel more emergent assessment is needed in an emergency room setting and recommend that presently. She noted she could drive herself there when I echoed concerns about that, and stated she had no other way to get there.  It is an extremely short distance, and talk with Melissa about making sure she gets to the emergency room with a follow-up phone call felt best and reasonable.   Neuropathy, muscle spasticity She notes the pain has been significant in the recent past, and has been using her pain medications more frequently than prescribed.  I noted concerns with this, especially with her liver disease history. Await evaluation in the emergency setting presently.  Alcoholic cirrhosis of liver with ascites ([place]) Continue to follow-up with gastroenterology, although has no follow-up appointment scheduled. Do feel she needs a more immediate follow-up presently, although await  evaluation in the emergency setting to help.   She was in agreement with the next steps as above noted, and await their help with the more urgent assessment and immediate results presently.        Towanda Malkin, MD 05/28/20 1:25 PM

## 2020-05-28 ENCOUNTER — Emergency Department: Payer: Medicare Other

## 2020-05-28 ENCOUNTER — Encounter: Payer: Self-pay | Admitting: Internal Medicine

## 2020-05-28 ENCOUNTER — Encounter: Payer: Self-pay | Admitting: Emergency Medicine

## 2020-05-28 ENCOUNTER — Emergency Department
Admission: EM | Admit: 2020-05-28 | Discharge: 2020-05-28 | Disposition: A | Discharge status: Home / Self Care | Payer: Medicare Other | Source: Home / Self Care

## 2020-05-28 ENCOUNTER — Ambulatory Visit (INDEPENDENT_AMBULATORY_CARE_PROVIDER_SITE_OTHER): Payer: Medicare Other | Admitting: Internal Medicine

## 2020-05-28 ENCOUNTER — Other Ambulatory Visit: Payer: Self-pay

## 2020-05-28 VITALS — BP 106/70 | HR 109 | Temp 97.6°F | Resp 16 | Ht 69.0 in | Wt 127.0 lb

## 2020-05-28 DIAGNOSIS — E43 Unspecified severe protein-calorie malnutrition: Secondary | ICD-10-CM | POA: Diagnosis not present

## 2020-05-28 DIAGNOSIS — R6521 Severe sepsis with septic shock: Secondary | ICD-10-CM | POA: Diagnosis not present

## 2020-05-28 DIAGNOSIS — I4892 Unspecified atrial flutter: Secondary | ICD-10-CM | POA: Diagnosis not present

## 2020-05-28 DIAGNOSIS — R112 Nausea with vomiting, unspecified: Secondary | ICD-10-CM

## 2020-05-28 DIAGNOSIS — G8929 Other chronic pain: Secondary | ICD-10-CM

## 2020-05-28 DIAGNOSIS — Z5321 Procedure and treatment not carried out due to patient leaving prior to being seen by health care provider: Secondary | ICD-10-CM | POA: Insufficient documentation

## 2020-05-28 DIAGNOSIS — I700 Atherosclerosis of aorta: Secondary | ICD-10-CM | POA: Diagnosis not present

## 2020-05-28 DIAGNOSIS — I959 Hypotension, unspecified: Secondary | ICD-10-CM

## 2020-05-28 DIAGNOSIS — K7031 Alcoholic cirrhosis of liver with ascites: Secondary | ICD-10-CM

## 2020-05-28 DIAGNOSIS — G928 Other toxic encephalopathy: Secondary | ICD-10-CM | POA: Diagnosis not present

## 2020-05-28 DIAGNOSIS — G629 Polyneuropathy, unspecified: Secondary | ICD-10-CM

## 2020-05-28 DIAGNOSIS — R634 Abnormal weight loss: Secondary | ICD-10-CM

## 2020-05-28 DIAGNOSIS — G5783 Other specified mononeuropathies of bilateral lower limbs: Secondary | ICD-10-CM | POA: Insufficient documentation

## 2020-05-28 DIAGNOSIS — Z20822 Contact with and (suspected) exposure to covid-19: Secondary | ICD-10-CM | POA: Diagnosis not present

## 2020-05-28 DIAGNOSIS — R5383 Other fatigue: Secondary | ICD-10-CM | POA: Diagnosis not present

## 2020-05-28 DIAGNOSIS — R1011 Right upper quadrant pain: Secondary | ICD-10-CM | POA: Diagnosis not present

## 2020-05-28 DIAGNOSIS — A419 Sepsis, unspecified organism: Secondary | ICD-10-CM | POA: Diagnosis not present

## 2020-05-28 DIAGNOSIS — J90 Pleural effusion, not elsewhere classified: Secondary | ICD-10-CM | POA: Diagnosis not present

## 2020-05-28 DIAGNOSIS — R1013 Epigastric pain: Secondary | ICD-10-CM | POA: Diagnosis not present

## 2020-05-28 DIAGNOSIS — J189 Pneumonia, unspecified organism: Secondary | ICD-10-CM | POA: Diagnosis not present

## 2020-05-28 DIAGNOSIS — R42 Dizziness and giddiness: Secondary | ICD-10-CM | POA: Diagnosis not present

## 2020-05-28 DIAGNOSIS — R1084 Generalized abdominal pain: Secondary | ICD-10-CM | POA: Insufficient documentation

## 2020-05-28 DIAGNOSIS — F324 Major depressive disorder, single episode, in partial remission: Secondary | ICD-10-CM

## 2020-05-28 DIAGNOSIS — K7460 Unspecified cirrhosis of liver: Secondary | ICD-10-CM | POA: Diagnosis not present

## 2020-05-28 DIAGNOSIS — R0602 Shortness of breath: Secondary | ICD-10-CM | POA: Insufficient documentation

## 2020-05-28 LAB — CBC
HCT: 46.4 % — ABNORMAL HIGH (ref 36.0–46.0)
Hemoglobin: 16.4 g/dL — ABNORMAL HIGH (ref 12.0–15.0)
MCH: 32 pg (ref 26.0–34.0)
MCHC: 35.3 g/dL (ref 30.0–36.0)
MCV: 90.6 fL (ref 80.0–100.0)
Platelets: 218 10*3/uL (ref 150–400)
RBC: 5.12 MIL/uL — ABNORMAL HIGH (ref 3.87–5.11)
RDW: 16.8 % — ABNORMAL HIGH (ref 11.5–15.5)
WBC: 11.9 10*3/uL — ABNORMAL HIGH (ref 4.0–10.5)
nRBC: 0 % (ref 0.0–0.2)

## 2020-05-28 LAB — COMPREHENSIVE METABOLIC PANEL
ALT: 28 U/L (ref 0–44)
AST: 53 U/L — ABNORMAL HIGH (ref 15–41)
Albumin: 3.5 g/dL (ref 3.5–5.0)
Alkaline Phosphatase: 219 U/L — ABNORMAL HIGH (ref 38–126)
Anion gap: 12 (ref 5–15)
BUN: 12 mg/dL (ref 6–20)
CO2: 28 mmol/L (ref 22–32)
Calcium: 9.1 mg/dL (ref 8.9–10.3)
Chloride: 99 mmol/L (ref 98–111)
Creatinine, Ser: 0.59 mg/dL (ref 0.44–1.00)
GFR calc Af Amer: >60 mL/min (ref >60)
GFR calc non Af Amer: >60 mL/min (ref >60)
Glucose, Bld: 112 mg/dL — ABNORMAL HIGH (ref 70–99)
Potassium: 2.9 mmol/L — ABNORMAL LOW (ref 3.5–5.1)
Sodium: 139 mmol/L (ref 135–145)
Total Bilirubin: 5 mg/dL — ABNORMAL HIGH (ref 0.3–1.2)
Total Protein: 6.4 g/dL — ABNORMAL LOW (ref 6.5–8.1)

## 2020-05-28 LAB — TROPONIN I (HIGH SENSITIVITY)
Troponin I (High Sensitivity): 27 ng/L — ABNORMAL HIGH (ref <18)
Troponin I (High Sensitivity): 30 ng/L — ABNORMAL HIGH (ref <18)

## 2020-05-28 LAB — LIPASE, BLOOD: Lipase: 28 U/L (ref 11–51)

## 2020-05-28 IMAGING — CR DG CHEST 2V
2 series · 2 of 2 positions shown · non-contrast
Comparison: [DATE]

CLINICAL DATA: Shortness of breath

EXAM:
CHEST - 2 VIEW

[chest pa]
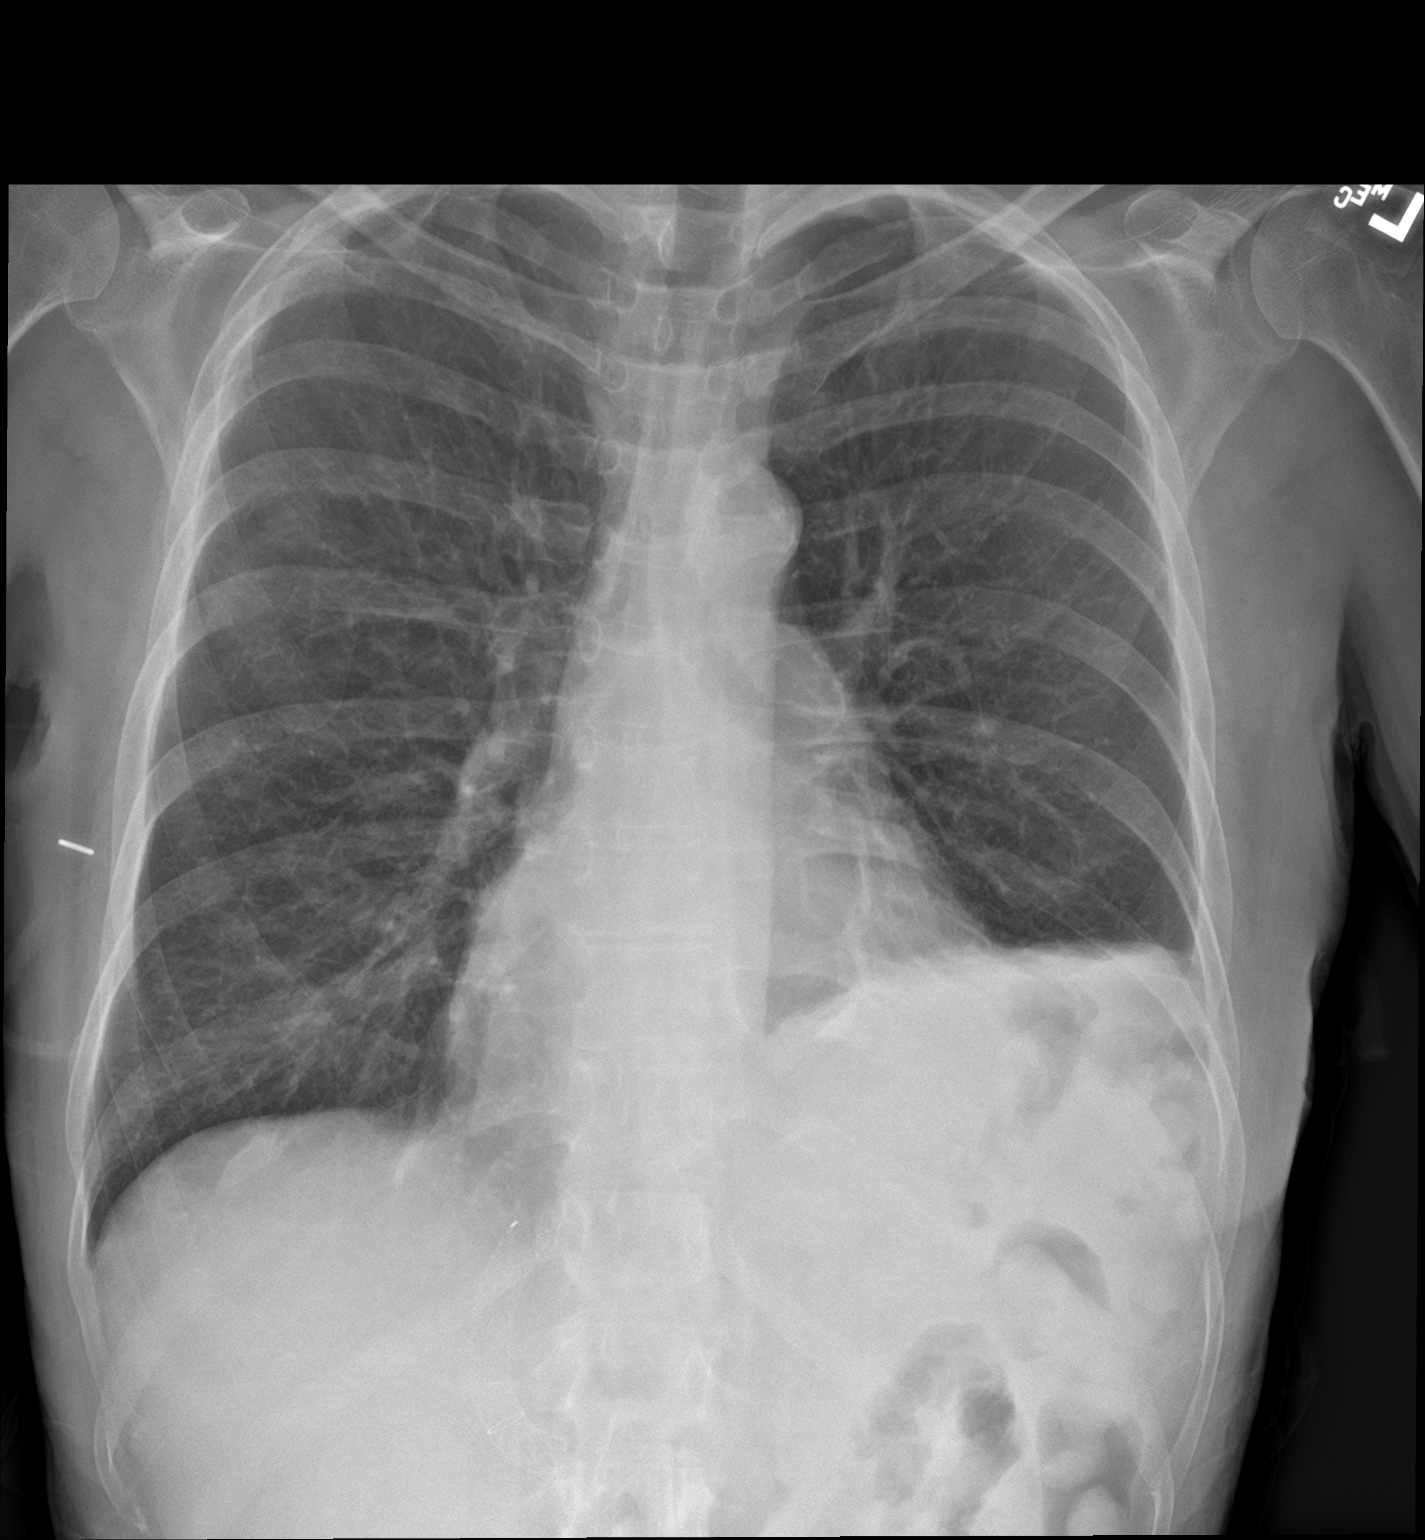

[chest lat]
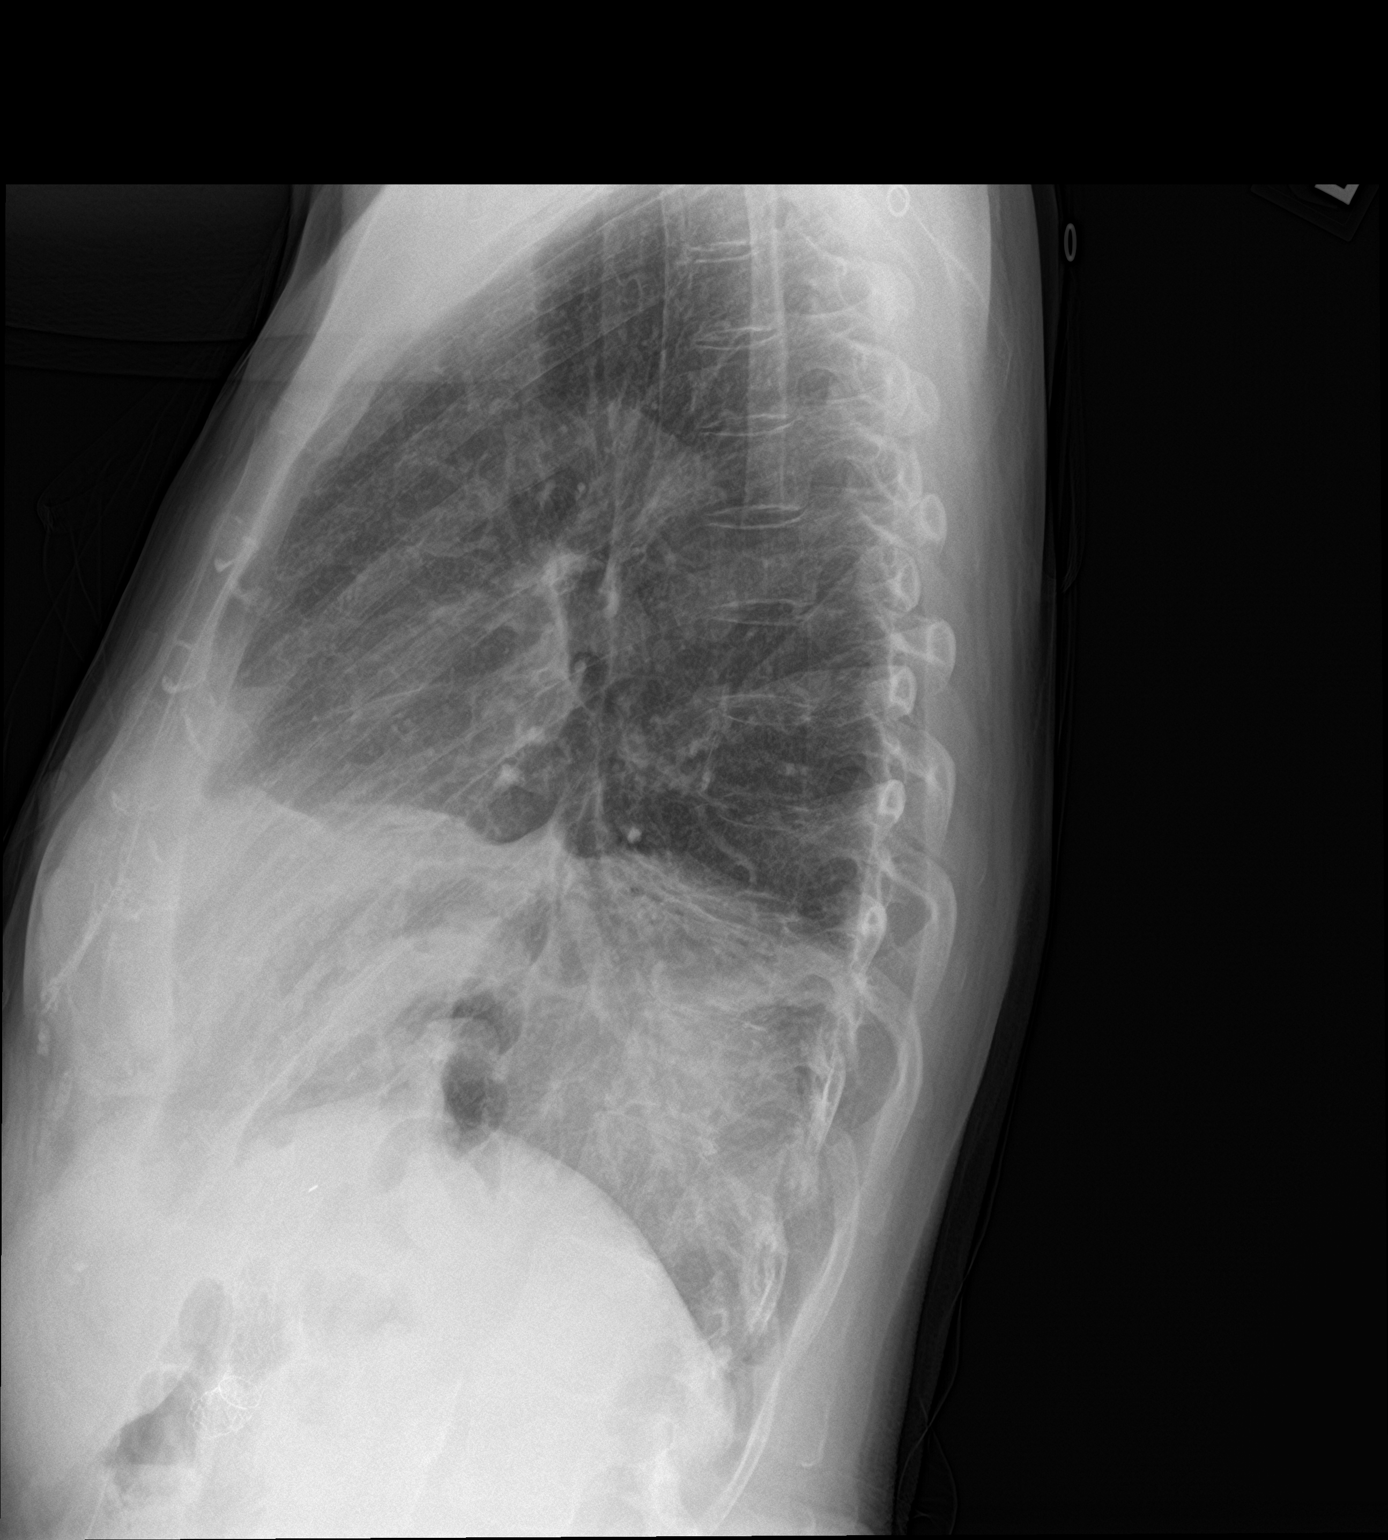

[2 of 2 positions shown; findings below may reference images not displayed]

FINDINGS: Right lung is grossly clear. Interval elevation of left diaphragm
with suspected pleural effusion on the left. Stable
cardiomediastinal silhouette. Aortic atherosclerosis. No
pneumothorax.
IMPRESSION: Interval elevation of left diaphragm. Suspected left pleural
effusion. Mild airspace disease at the left base, atelectasis versus
pneumonia.

## 2020-05-28 NOTE — ED Triage Notes (Signed)
Pt presents to ED via POV with c/o abdominal pain, emesis, reports 70lbs weight loss over 2.5 months. Pt states "I'm in a lot of distress in my stomach", pt also c/o peripheral neuropathy to bilateral legs, pt states "so the first thing I'm going to do is start screaming for pain medication". Pt also c/o intermittent SOB, states "the last time I felt this short of breath I needed blood".   Pt c/o generalized abdominal pain/N/V x 1 week. Pt denies blood in her vomit.   Pt appears pale in triage.

## 2020-05-28 NOTE — Patient Instructions (Signed)
With the recent vomiting, stomach pains, weight loss, exhaustion, and increased neuropathic pain with a history of significant liver disease and GI bleed history, I do feel more emergent assessment is needed in an emergency room setting and recommend that presently.

## 2020-05-29 ENCOUNTER — Telehealth: Payer: Self-pay | Admitting: Internal Medicine

## 2020-05-29 DIAGNOSIS — M62838 Other muscle spasm: Secondary | ICD-10-CM

## 2020-05-29 DIAGNOSIS — G8929 Other chronic pain: Secondary | ICD-10-CM

## 2020-05-29 LAB — TYPE AND SCREEN
ABO/RH(D): O POS
Antibody Screen: NEGATIVE

## 2020-05-29 MED ORDER — GABAPENTIN 400 MG PO CAPS: 400.0000 mg | ORAL_CAPSULE | Freq: Three times a day (TID) | ORAL | 0 refills | Status: DC

## 2020-05-29 MED ORDER — TIZANIDINE HCL 4 MG PO CAPS: ORAL_CAPSULE | ORAL | 1 refills | Status: DC

## 2020-05-29 NOTE — Telephone Encounter (Signed)
Pt called saying [facility] will not fill her prescriptions saying it is too early.  She ask that the office please call [place].[name]

## 2020-05-29 NOTE — Telephone Encounter (Signed)
Please refill those two medicines for her. And try to encourage her to return to the ER. Thanks

## 2020-05-29 NOTE — Telephone Encounter (Signed)
Pt request refill  gabapentin (NEURONTIN) 400 MG capsule  tiZANidine (ZANAFLEX) 4 MG capsule  Pt states she saw the dr yesterday and she thought these rx were sent in. Pt states she is in a lot of pain and needs asap.  [place], [place] Phone:  [phone]  Fax:  [phone]      Pt left the ED yesterday after 7 hours and she finally left. No one ever came and got her. Pt would like a call back

## 2020-05-29 NOTE — Telephone Encounter (Signed)
Medications refilled per Dr. Roxan Hockey approval. I spoke with the patient and advised her to return to the ER. She says she is having a really bad day, I advised her to call an ambulance and she declines at this time. Patient advised it's very important she Emergent care, she informed me that she will go back tomorrow. Again, I urged the patient to go ED and she declines and says she will go tomorrow. Patient will call ambulance if symptoms continue to worsen.

## 2020-05-30 ENCOUNTER — Encounter: Payer: Self-pay | Admitting: Emergency Medicine

## 2020-05-30 ENCOUNTER — Emergency Department: Payer: Medicare Other

## 2020-05-30 ENCOUNTER — Other Ambulatory Visit: Payer: Self-pay

## 2020-05-30 ENCOUNTER — Inpatient Hospital Stay
Admission: EM | Admit: 2020-05-30 | Discharge: 2020-06-03 | DRG: 871 | Disposition: A | Discharge status: Hospice | Payer: Medicare Other | Attending: Internal Medicine | Admitting: Internal Medicine

## 2020-05-30 DIAGNOSIS — D500 Iron deficiency anemia secondary to blood loss (chronic): Secondary | ICD-10-CM | POA: Diagnosis present

## 2020-05-30 DIAGNOSIS — J189 Pneumonia, unspecified organism: Secondary | ICD-10-CM | POA: Diagnosis present

## 2020-05-30 DIAGNOSIS — R6521 Severe sepsis with septic shock: Secondary | ICD-10-CM | POA: Diagnosis not present

## 2020-05-30 DIAGNOSIS — I700 Atherosclerosis of aorta: Secondary | ICD-10-CM | POA: Diagnosis not present

## 2020-05-30 DIAGNOSIS — I8510 Secondary esophageal varices without bleeding: Secondary | ICD-10-CM | POA: Diagnosis present

## 2020-05-30 DIAGNOSIS — A419 Sepsis, unspecified organism: Principal | ICD-10-CM | POA: Diagnosis present

## 2020-05-30 DIAGNOSIS — R112 Nausea with vomiting, unspecified: Secondary | ICD-10-CM | POA: Diagnosis not present

## 2020-05-30 DIAGNOSIS — J9600 Acute respiratory failure, unspecified whether with hypoxia or hypercapnia: Secondary | ICD-10-CM

## 2020-05-30 DIAGNOSIS — K7460 Unspecified cirrhosis of liver: Secondary | ICD-10-CM

## 2020-05-30 DIAGNOSIS — I10 Essential (primary) hypertension: Secondary | ICD-10-CM | POA: Diagnosis present

## 2020-05-30 DIAGNOSIS — I4892 Unspecified atrial flutter: Secondary | ICD-10-CM | POA: Diagnosis present

## 2020-05-30 DIAGNOSIS — F17210 Nicotine dependence, cigarettes, uncomplicated: Secondary | ICD-10-CM | POA: Diagnosis present

## 2020-05-30 DIAGNOSIS — E43 Unspecified severe protein-calorie malnutrition: Secondary | ICD-10-CM | POA: Diagnosis present

## 2020-05-30 DIAGNOSIS — Z66 Do not resuscitate: Secondary | ICD-10-CM | POA: Diagnosis not present

## 2020-05-30 DIAGNOSIS — R42 Dizziness and giddiness: Secondary | ICD-10-CM | POA: Diagnosis not present

## 2020-05-30 DIAGNOSIS — E876 Hypokalemia: Secondary | ICD-10-CM | POA: Diagnosis present

## 2020-05-30 DIAGNOSIS — K219 Gastro-esophageal reflux disease without esophagitis: Secondary | ICD-10-CM | POA: Diagnosis present

## 2020-05-30 DIAGNOSIS — G928 Other toxic encephalopathy: Secondary | ICD-10-CM | POA: Diagnosis present

## 2020-05-30 DIAGNOSIS — R1013 Epigastric pain: Secondary | ICD-10-CM | POA: Diagnosis not present

## 2020-05-30 DIAGNOSIS — Z681 Body mass index (BMI) 19 or less, adult: Secondary | ICD-10-CM

## 2020-05-30 DIAGNOSIS — Z809 Family history of malignant neoplasm, unspecified: Secondary | ICD-10-CM

## 2020-05-30 DIAGNOSIS — Z20822 Contact with and (suspected) exposure to covid-19: Secondary | ICD-10-CM | POA: Diagnosis present

## 2020-05-30 DIAGNOSIS — K7030 Alcoholic cirrhosis of liver without ascites: Secondary | ICD-10-CM

## 2020-05-30 DIAGNOSIS — I4819 Other persistent atrial fibrillation: Secondary | ICD-10-CM | POA: Diagnosis present

## 2020-05-30 DIAGNOSIS — K766 Portal hypertension: Secondary | ICD-10-CM | POA: Diagnosis present

## 2020-05-30 DIAGNOSIS — Z515 Encounter for palliative care: Secondary | ICD-10-CM

## 2020-05-30 DIAGNOSIS — R634 Abnormal weight loss: Secondary | ICD-10-CM | POA: Diagnosis not present

## 2020-05-30 DIAGNOSIS — T50905A Adverse effect of unspecified drugs, medicaments and biological substances, initial encounter: Secondary | ICD-10-CM | POA: Diagnosis present

## 2020-05-30 LAB — CBC
HCT: 47.3 % — ABNORMAL HIGH (ref 36.0–46.0)
Hemoglobin: 16.3 g/dL — ABNORMAL HIGH (ref 12.0–15.0)
MCH: 31.9 pg (ref 26.0–34.0)
MCHC: 34.5 g/dL (ref 30.0–36.0)
MCV: 92.6 fL (ref 80.0–100.0)
Platelets: 212 10*3/uL (ref 150–400)
RBC: 5.11 MIL/uL (ref 3.87–5.11)
RDW: 16.6 % — ABNORMAL HIGH (ref 11.5–15.5)
WBC: 16.2 10*3/uL — ABNORMAL HIGH (ref 4.0–10.5)
nRBC: 0 % (ref 0.0–0.2)

## 2020-05-30 LAB — CBC WITH DIFFERENTIAL/PLATELET
Abs Immature Granulocytes: 0.08 10*3/uL — ABNORMAL HIGH (ref 0.00–0.07)
Basophils Absolute: 0.1 10*3/uL (ref 0.0–0.1)
Basophils Relative: 1 %
Eosinophils Absolute: 0.2 10*3/uL (ref 0.0–0.5)
Eosinophils Relative: 2 %
HCT: 35.8 % — ABNORMAL LOW (ref 36.0–46.0)
Hemoglobin: 12.2 g/dL (ref 12.0–15.0)
Immature Granulocytes: 1 %
Lymphocytes Relative: 24 %
Lymphs Abs: 2.6 10*3/uL (ref 0.7–4.0)
MCH: 33.2 pg (ref 26.0–34.0)
MCHC: 34.1 g/dL (ref 30.0–36.0)
MCV: 97.3 fL (ref 80.0–100.0)
Monocytes Absolute: 1.1 10*3/uL — ABNORMAL HIGH (ref 0.1–1.0)
Monocytes Relative: 11 %
Neutro Abs: 6.6 10*3/uL (ref 1.7–7.7)
Neutrophils Relative %: 61 %
Platelets: 178 10*3/uL (ref 150–400)
RBC: 3.68 MIL/uL — ABNORMAL LOW (ref 3.87–5.11)
RDW: 16.9 % — ABNORMAL HIGH (ref 11.5–15.5)
WBC: 10.7 10*3/uL — ABNORMAL HIGH (ref 4.0–10.5)
nRBC: 0 % (ref 0.0–0.2)

## 2020-05-30 LAB — LACTIC ACID, PLASMA: Lactic Acid, Venous: 2.6 mmol/L (ref 0.5–1.9)

## 2020-05-30 LAB — COMPREHENSIVE METABOLIC PANEL
ALT: 28 U/L (ref 0–44)
AST: 58 U/L — ABNORMAL HIGH (ref 15–41)
Albumin: 3.6 g/dL (ref 3.5–5.0)
Alkaline Phosphatase: 228 U/L — ABNORMAL HIGH (ref 38–126)
Anion gap: 10 (ref 5–15)
BUN: 10 mg/dL (ref 6–20)
CO2: 29 mmol/L (ref 22–32)
Calcium: 9 mg/dL (ref 8.9–10.3)
Chloride: 101 mmol/L (ref 98–111)
Creatinine, Ser: 0.63 mg/dL (ref 0.44–1.00)
GFR calc Af Amer: >60 mL/min (ref >60)
GFR calc non Af Amer: >60 mL/min (ref >60)
Glucose, Bld: 97 mg/dL (ref 70–99)
Potassium: 3 mmol/L — ABNORMAL LOW (ref 3.5–5.1)
Sodium: 140 mmol/L (ref 135–145)
Total Bilirubin: 4.6 mg/dL — ABNORMAL HIGH (ref 0.3–1.2)
Total Protein: 6.4 g/dL — ABNORMAL LOW (ref 6.5–8.1)

## 2020-05-30 LAB — RESPIRATORY PANEL BY RT PCR (FLU A&B, COVID)
Influenza A by PCR: NEGATIVE
Influenza B by PCR: NEGATIVE
SARS Coronavirus 2 by RT PCR: NEGATIVE

## 2020-05-30 LAB — PROTIME-INR
INR: 1.3 — ABNORMAL HIGH (ref 0.8–1.2)
Prothrombin Time: 15.9 seconds — ABNORMAL HIGH (ref 11.4–15.2)

## 2020-05-30 LAB — AMMONIA: Ammonia: 45 umol/L — ABNORMAL HIGH (ref 9–35)

## 2020-05-30 LAB — TROPONIN I (HIGH SENSITIVITY)
Troponin I (High Sensitivity): 60 ng/L — ABNORMAL HIGH (ref <18)
Troponin I (High Sensitivity): 56 ng/L — ABNORMAL HIGH (ref <18)

## 2020-05-30 LAB — GLUCOSE, CAPILLARY: Glucose-Capillary: 140 mg/dL — ABNORMAL HIGH (ref 70–99)

## 2020-05-30 LAB — ETHANOL: Alcohol, Ethyl (B): <10 mg/dL (ref <10)

## 2020-05-30 IMAGING — US US ABDOMEN LIMITED
1 series · 14 of 25 positions shown · non-contrast
Comparison: CT [DATE]

CLINICAL DATA: Epigastric pain

EXAM:
ULTRASOUND ABDOMEN LIMITED RIGHT UPPER QUADRANT

[Series 1: us abdomen limited ruq · 14 of 29 slices shown]
[im 1/29]
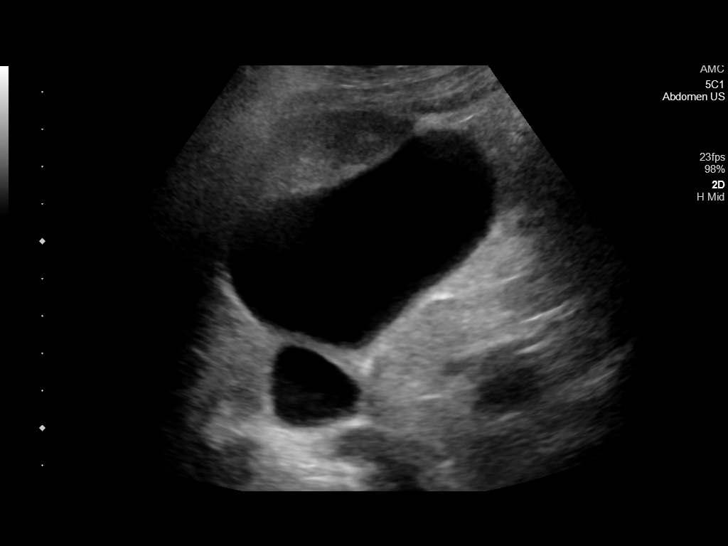
[im 3/29]
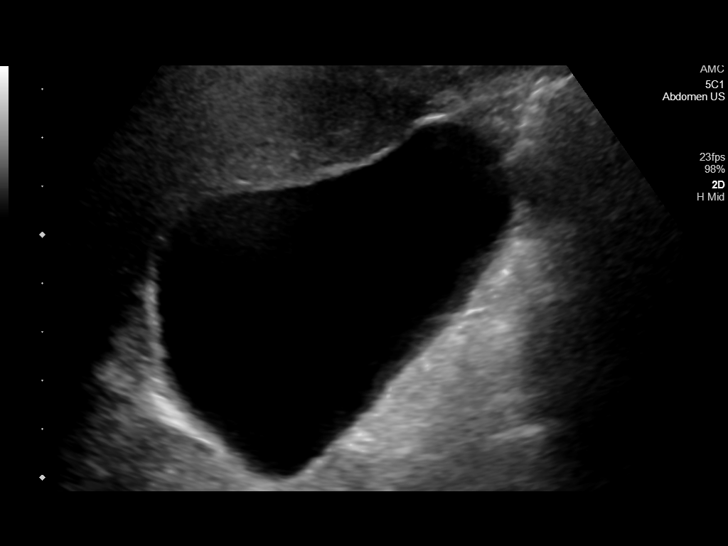
[im 5/29]
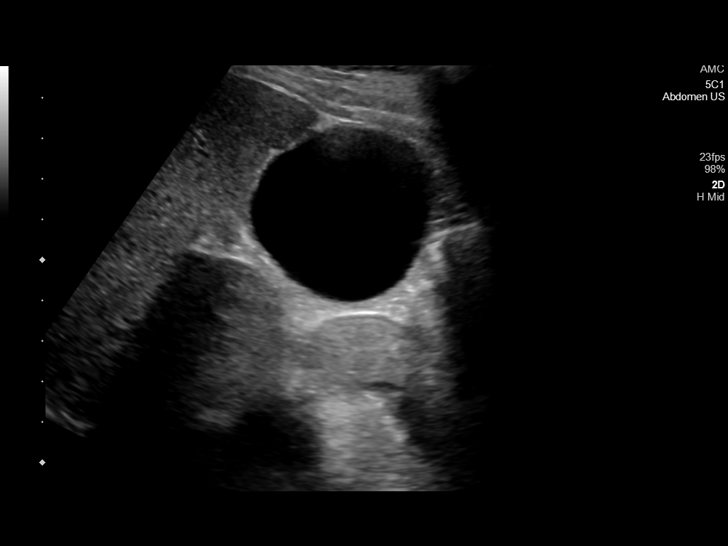
[im 8/29]
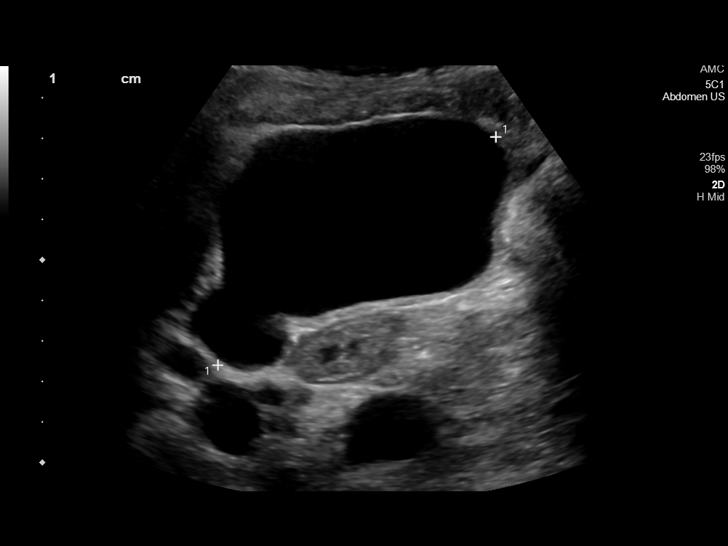
[im 10/29]
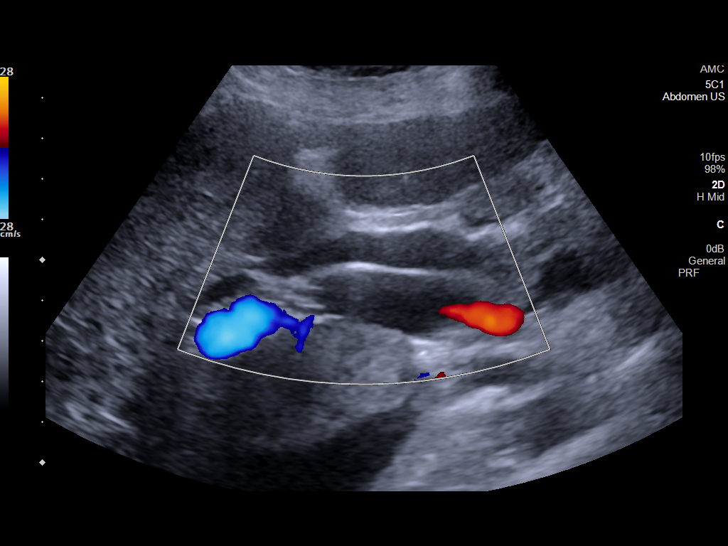
[im 11/29]
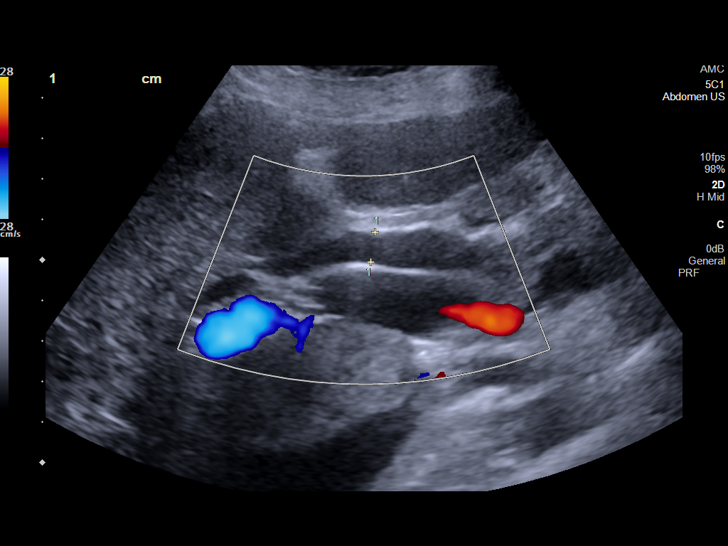
[im 13/29]
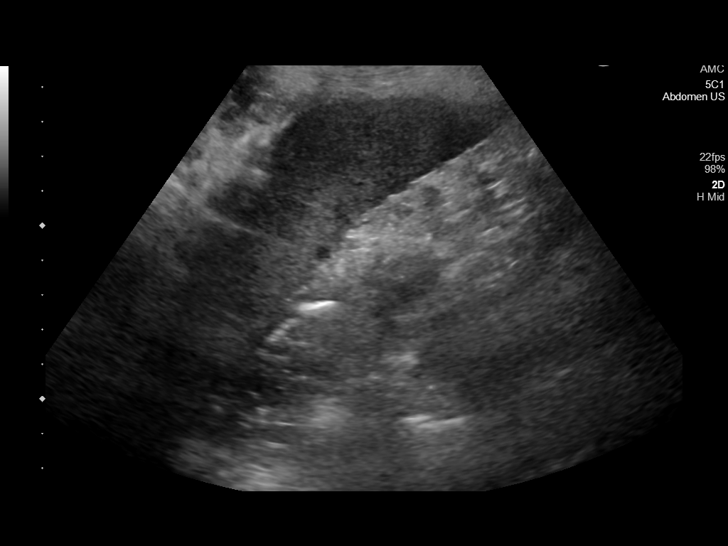
[im 16/29]
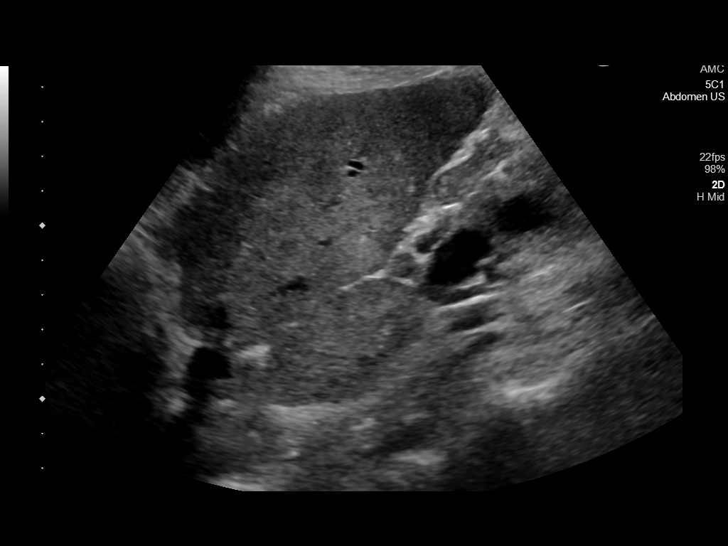
[im 18/29]
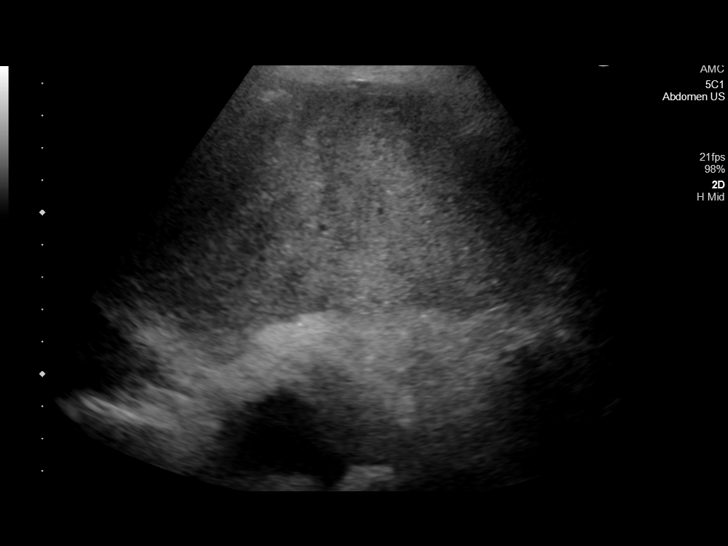
[im 19/29]
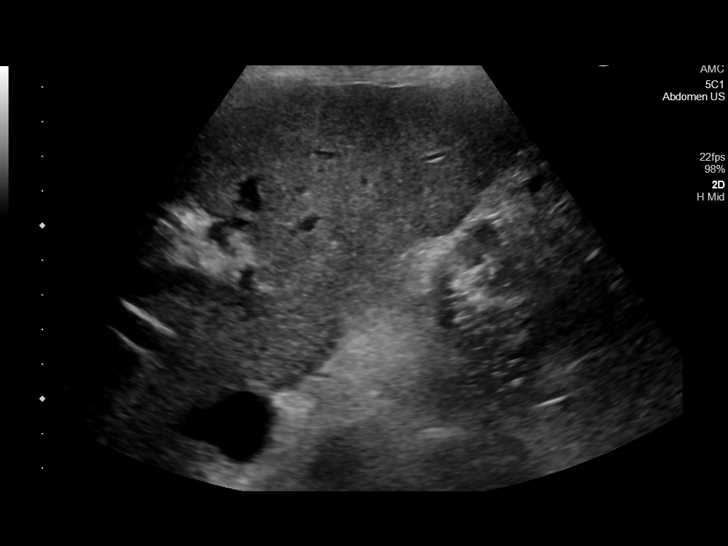
[im 22/29]
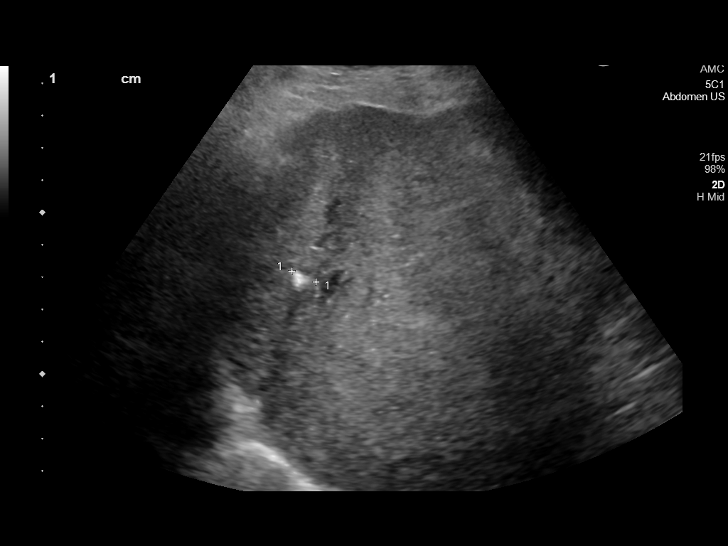
[im 24/29]
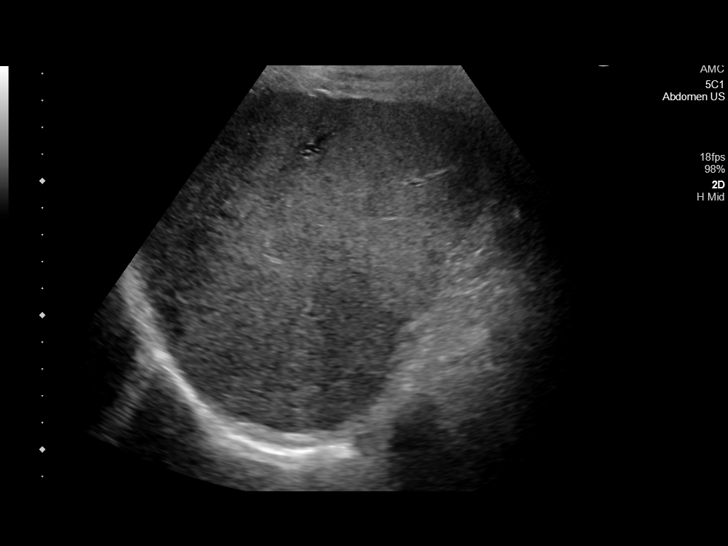
[im 26/29]
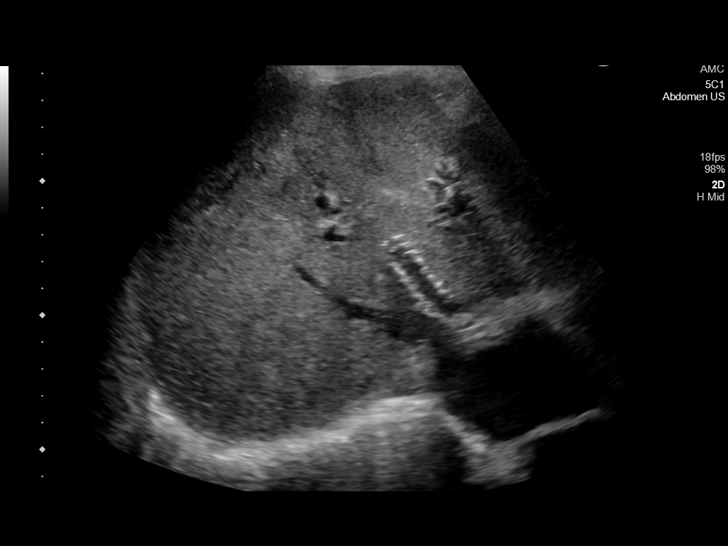
[im 29/29]
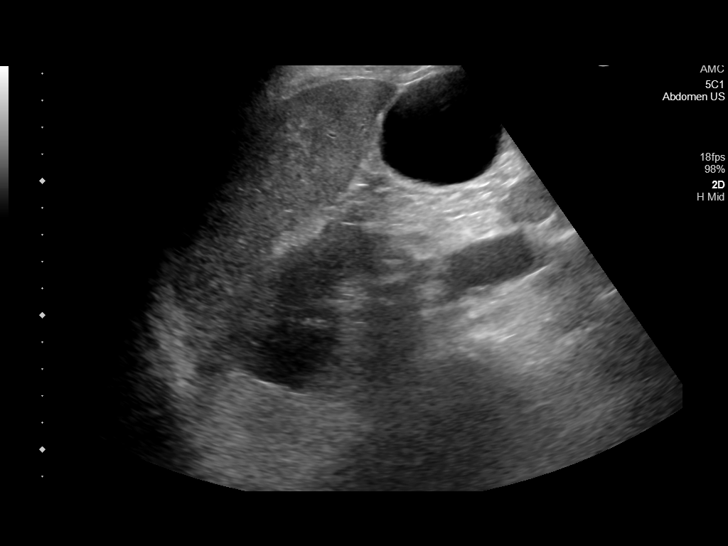

[14 of 25 positions shown; findings below may reference images not displayed]

FINDINGS: Gallbladder:

Distended gallbladder without shadowing stone. Normal wall
thickness. Negative sonographic Murphy.

Common bile duct:

Diameter: Slightly enlarged at 7.4 mm

Liver:

Cirrhotic morphology of the liver. Calcification in the right
hepatic lobe. Portal vein is patent on color Doppler imaging with
normal direction of blood flow towards the liver. Patient has
history of tips. The tips shunt was not specifically evaluated.

Other: None.
IMPRESSION: 1. Distended gallbladder without shadowing stone or other suspicious
sonographic features.
2. Slightly enlarged common bile duct.

## 2020-05-30 IMAGING — CT CT ANGIO CHEST-ABD-PELV FOR DISSECTION W/ AND WO/W CM
2 of 7 series · 12 of 46 positions shown, 14 images · IV contrast (omnipaque)
Comparison: Abdominopelvic CT and liver MRI [DATE]

CLINICAL DATA: Abdominal pain, aortic dissection suspected

Patient reports abdominal pain with dizziness and nausea and
vomiting. Weight loss.
EXAM:
CT ANGIOGRAPHY CHEST, ABDOMEN AND PELVIS
TECHNIQUE: Non-contrast CT of the chest was initially obtained. Multidetector
CT imaging through the chest, abdomen and pelvis was performed using
the standard protocol during bolus administration of intravenous
contrast. Multiplanar reconstructed images and MIPs were obtained
and reviewed to evaluate the vascular anatomy.
CONTRAST:  100mL OMNIPAQUE IOHEXOL 350 MG/ML SOLN

[Series 6: axial arterial · axial · arterial · 0.66mm/px · z∈[-1185,-621]mm · 9 of 216 slices shown, 11 images]
[im 14/216  soft-tissue]
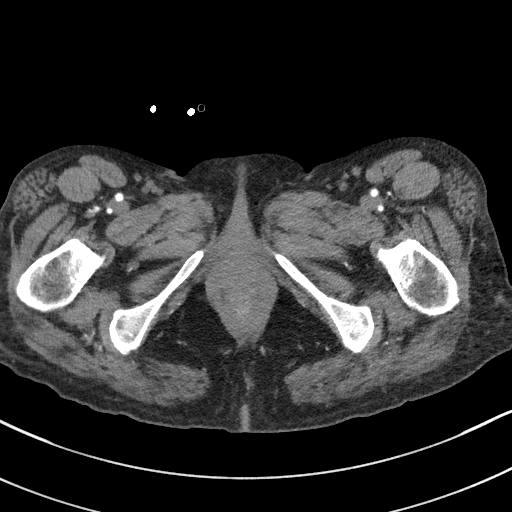
[im 14/216  bone]
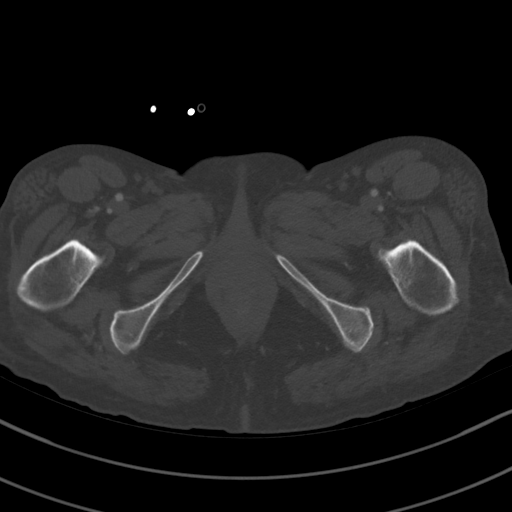
[im 41/216  soft-tissue]
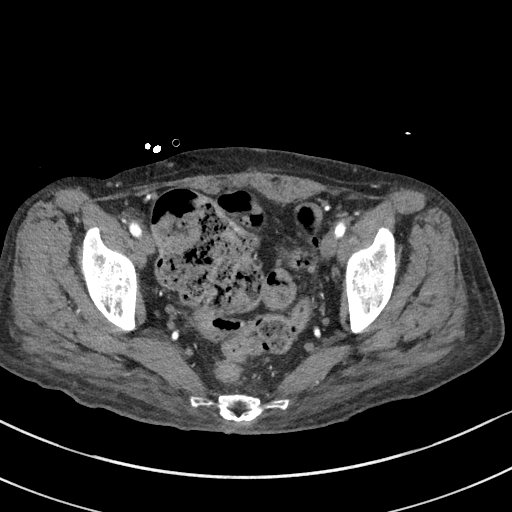
[im 68/216  soft-tissue]
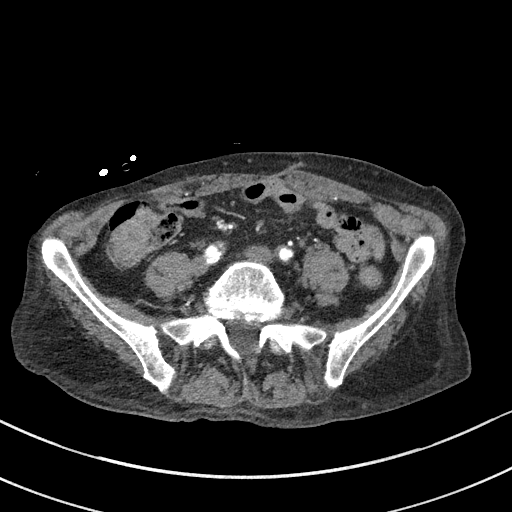
[im 81/216  soft-tissue]
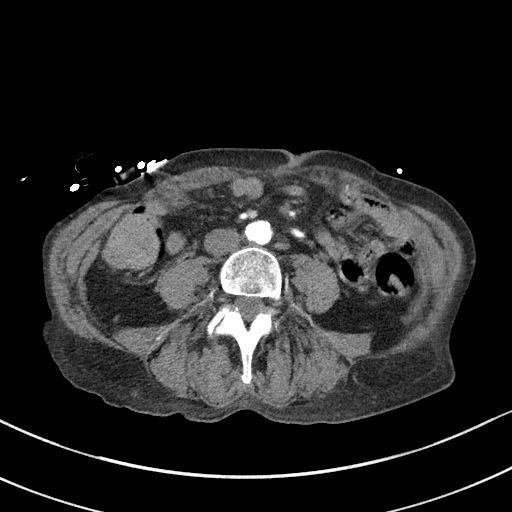
[im 108/216  soft-tissue]
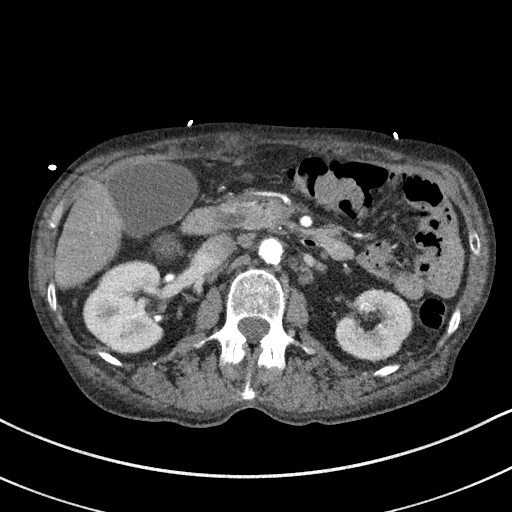
[im 135/216  soft-tissue]
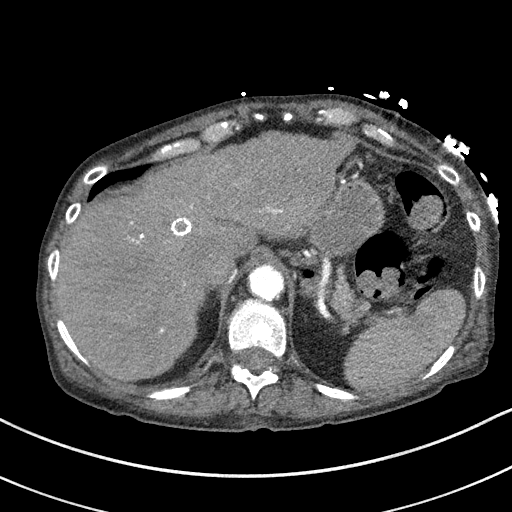
[im 148/216  soft-tissue]
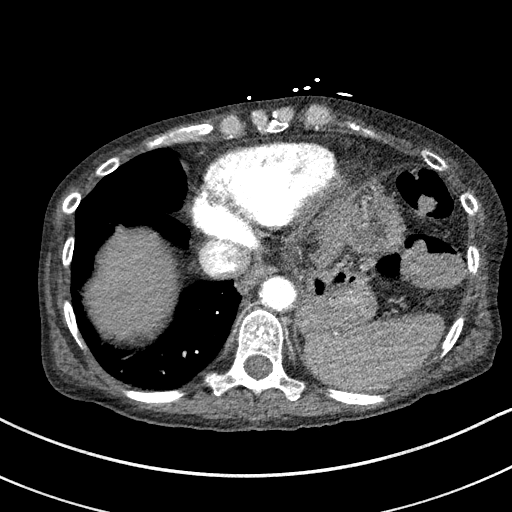
[im 175/216  soft-tissue]
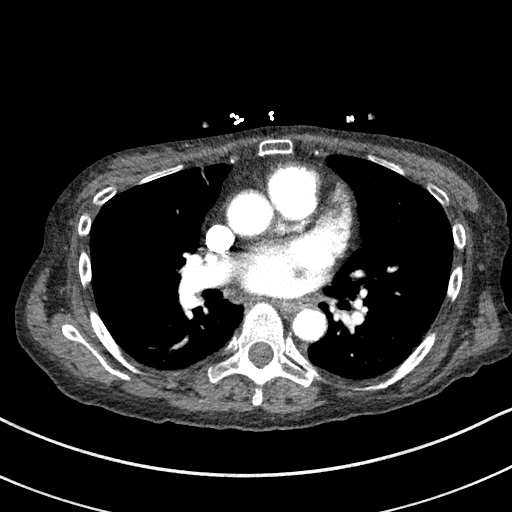
[im 202/216  soft-tissue]
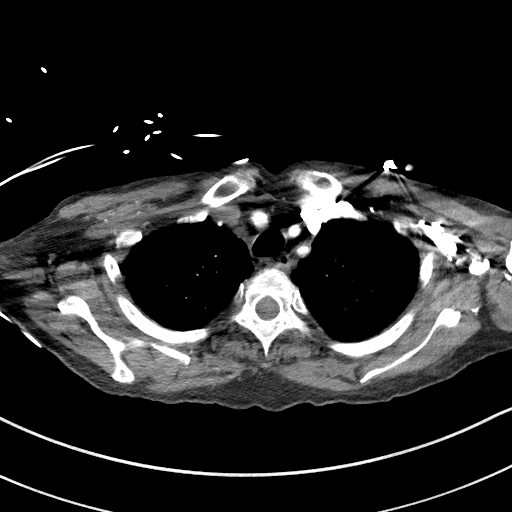
[im 202/216  bone]
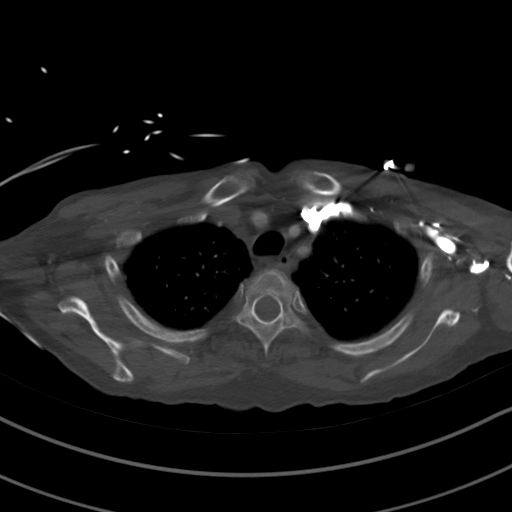

[Series 9: coronals · coronal · 0.64mm/px · 3 of 113 slices shown]
[im 29/113  soft-tissue]
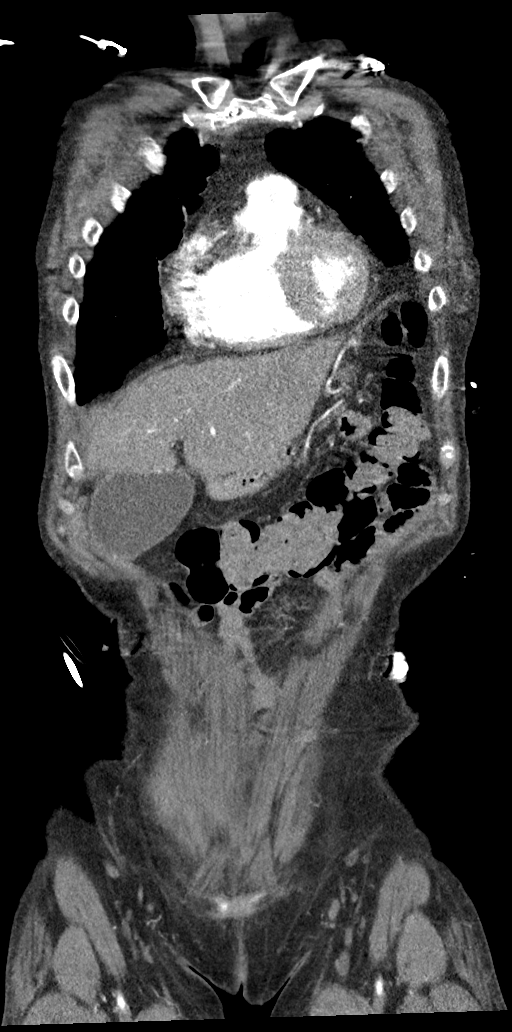
[im 57/113  soft-tissue]
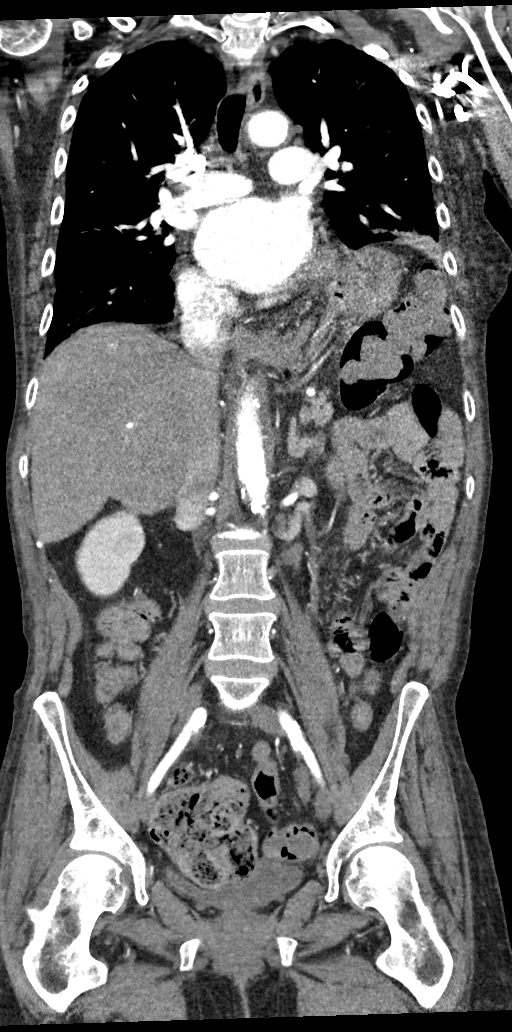
[im 85/113  soft-tissue]
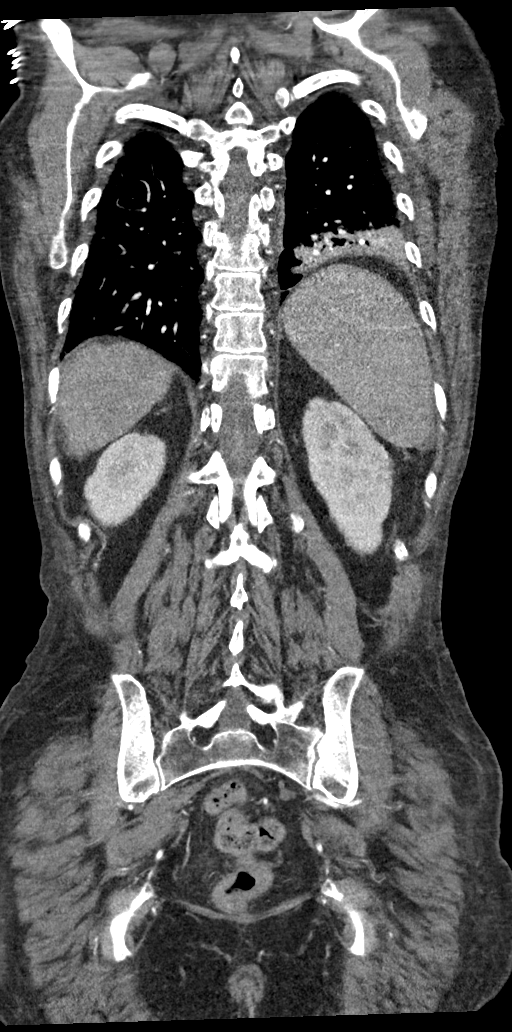

[12 of 46 positions shown; findings below may reference images not displayed]

FINDINGS: CTA CHEST FINDINGS

Cardiovascular: Noncontrast exam without aortic hematoma. Mild
atheromatous plaque primarily calcified without dissection,
aneurysm, or evidence of acute aortic syndrome. Conventional
branching pattern from the aortic arch. There is no central
pulmonary embolus to the segmental level. Upper normal heart size
with mild right heart dilatation. No pericardial effusion.

Mediastinum/Nodes: Mild distal esophageal wall thickening. No
enlarged mediastinal or hilar lymph nodes. No visualized thyroid
nodule.

Lungs/Pleura: Elevation of the left hemidiaphragm with dependent
left lower lobe consolidation. Hypoventilatory changes in the
dependent lungs. Mild apical emphysema. No findings of pulmonary
edema. No significant pleural effusion. Trachea and central bronchi
are patent.

Musculoskeletal: There are no acute or suspicious osseous
abnormalities. Generalized paucity of body fat.

Review of the MIP images confirms the above findings.

CTA ABDOMEN AND PELVIS FINDINGS

VASCULAR

Aorta: Normal caliber aorta without aneurysm, dissection, vasculitis
or significant stenosis. Moderate calcified and noncalcified
atheromatous plaque.

Celiac: Patent without evidence of aneurysm, dissection, vasculitis
or significant stenosis.

SMA: Patent without evidence of aneurysm, dissection, vasculitis or
significant stenosis. Replaced right hepatic artery arises from the
SMA.

Renals: Early branching of right renal artery. Single left renal
artery. Bilateral renal arteries are patent.

IMA: Patent.

Inflow: Patent without evidence of aneurysm, dissection, vasculitis
or significant stenosis. Moderate atherosclerosis.

Veins: Circumaortic left renal vein. TIPS in the liver which is not
well assessed on this arterial phase exam.

Review of the MIP images confirms the above findings.

NON-VASCULAR

Hepatobiliary: Cirrhosis with nodular hepatic contours. TIPS in
place, patency not well assessed on this arterial phase exam. There
is no evidence of arterially enhancing focal lesion. Similar
appearance of gallbladder and biliary dilatation from prior exam. No
calcified gallstone. No pericholecystic inflammation or gallbladder
wall thickening.

Pancreas: Parenchymal atrophy.  Insert

Spleen: Improved splenomegaly, spleen currently spans 12.8 cm
cranial caudal, previously 14.4 cm. No focal abnormality on arterial
phase imaging.

Adrenals/Urinary Tract: No adrenal nodule. No hydronephrosis or
perinephric edema. Urinary bladder is minimally distended. No
bladder wall thickening.

Stomach/Bowel: Decompressed stomach. No evidence of gastric or
duodenal wall thickening. Small duodenal diverticulum arising from
the third portion. No small bowel obstruction or inflammatory
change. Low lying cecum in the midline pelvis. Appendix not
confidently visualized. Moderate volume of colonic stool. There is
no colonic wall thickening. No abnormal rectal distention.

Lymphatic: No enlarged abdominopelvic lymph nodes.

Reproductive: Status post hysterectomy. No adnexal masses.

Other: Trace ascites in the left upper quadrant, volume amenable to
paracentesis. No free air.

Musculoskeletal: There are no acute or suspicious osseous
abnormalities.

Review of the MIP images confirms the above findings.
IMPRESSION: 1. Thoracoabdominal aortic atherosclerosis without dissection or
acute aortic abnormality. No central pulmonary embolus.
2. Consolidation in the dependent left lower lobe with associated
elevated hemidiaphragm, atelectasis versus pneumonia.
3. Cirrhosis with nodular hepatic contours. TIPS in place, patency
not well assessed on this arterial phase exam. Trace ascites in the
left upper quadrant.
4. Similar appearance of gallbladder and biliary dilatation from
prior exam.

Aortic Atherosclerosis ([SD]-[SD]) and Emphysema ([SD]-[SD]).

## 2020-05-30 MED ORDER — METRONIDAZOLE IN NACL 5-0.79 MG/ML-% IV SOLN
500.0000 mg | Freq: Once | INTRAVENOUS | Status: AC
  Administered 2020-05-30: 500 mg via INTRAVENOUS
  Filled 2020-05-30: qty 100

## 2020-05-30 MED ORDER — SODIUM CHLORIDE 0.9 % IV BOLUS
1000.0000 mL | Freq: Once | INTRAVENOUS | Status: AC
  Administered 2020-05-30: 1000 mL via INTRAVENOUS

## 2020-05-30 MED ORDER — SODIUM CHLORIDE 0.9 % IV SOLN
250.0000 mL | INTRAVENOUS | Status: DC
  Administered 2020-05-30 – 2020-06-01 (×2): 250 mL via INTRAVENOUS

## 2020-05-30 MED ORDER — NOREPINEPHRINE 4 MG/250ML-% IV SOLN
2.0000 ug/min | INTRAVENOUS | Status: DC
  Administered 2020-05-30: 2 ug/min via INTRAVENOUS
  Administered 2020-06-01: 3 ug/min via INTRAVENOUS
  Filled 2020-05-30 (×2): qty 250

## 2020-05-30 MED ORDER — SODIUM CHLORIDE 0.9 % IV SOLN
2.0000 g | Freq: Once | INTRAVENOUS | Status: AC
  Administered 2020-05-30: 2 g via INTRAVENOUS
  Filled 2020-05-30: qty 2

## 2020-05-30 MED ORDER — LACTATED RINGERS IV BOLUS
1000.0000 mL | Freq: Once | INTRAVENOUS | Status: AC
  Administered 2020-05-30: 1000 mL via INTRAVENOUS

## 2020-05-30 MED ORDER — VANCOMYCIN HCL IN DEXTROSE 1-5 GM/200ML-% IV SOLN
1000.0000 mg | Freq: Once | INTRAVENOUS | Status: AC
  Administered 2020-05-30: 1000 mg via INTRAVENOUS
  Filled 2020-05-30: qty 200

## 2020-05-30 MED ORDER — SODIUM CHLORIDE 0.9 % IV BOLUS: 1000.0000 mL | Freq: Once | INTRAVENOUS | Status: DC

## 2020-05-30 MED ORDER — IOHEXOL 350 MG/ML SOLN
100.0000 mL | Freq: Once | INTRAVENOUS | Status: AC | PRN
  Administered 2020-05-30: 100 mL via INTRAVENOUS

## 2020-05-30 NOTE — ED Notes (Signed)
Lab/phlebotomist at bedside. Warm blanket provided to pt as she stated she was cold.

## 2020-05-30 NOTE — ED Provider Notes (Signed)
[facility] Emergency Department Provider Note  ____________________________________________  Time seen: Approximately 11:32 PM  I have reviewed the triage vital signs and the nursing notes.   HISTORY  Chief Complaint Abdominal Pain and Dizziness    Level 5 Caveat: Portions of the History and Physical including HPI and review of systems are unable to be completely obtained due to patient altered mental status and critically ill  HPI [name] is a 59 y.o. female with a history of atrial flutter, lymphoma, cirrhosis status post TIPS, hypertension who comes the ED complaining of abdominal pain vomiting and dizziness, as well as weight loss over the last few weeks.  Denies black or bloody stool or hematemesis.  No recent trauma.  Denies fever.  Initial vital signs were normal, but on reassessment in the waiting room, blood pressure was found to be approximately 50/20 so she was brought immediately to treatment room.      Past Medical History:  Diagnosis Date  . Anemia   . Atrial flutter ([place])   . Cancer [facility])    lymphoma 11 yrs. ago  . Cirrhosis ([place])   . Dysrhythmia   . Esophageal varices ([place])   . GERD (gastroesophageal reflux disease)   . Hepatitis    hep C-resolved  . History of hiatal hernia   . Hypertension   . Pneumonia   . Portal hypertension [facility])      Patient Active Problem List   Diagnosis Date Noted  . Epigastric hernia 03/06/2020  . Major depressive disorder with single episode, in partial remission ([place]) 11/28/2019  . Neuropathy 11/28/2019  . Dysthymia 07/27/2019  . Other chronic pain 07/27/2019  . Muscle spasticity 07/27/2019  . C. difficile colitis   . Hypokalemia 07/15/2019  . Bandemia 07/15/2019  . Hypotension   . Goals of care, counseling/discussion   . Palliative care encounter   . Muscle cramping 06/22/2019  . Alcohol abuse 06/22/2019  . Sepsis ([place]) 06/22/2019  . GIB (gastrointestinal bleeding) 06/01/2019  .  GI bleeding 06/01/2019  . History of lymphoma 02/26/2019  . Atrial flutter ([place]) 02/07/2019  . Alcoholic cirrhosis of liver with ascites ([place]) 02/07/2019  . Persistent atrial fibrillation ([place]) 02/07/2019  . Anemia 02/07/2019  . Anorexia 02/07/2019  . GI bleed 01/28/2019  . Ascites 01/18/2019  . Cancer [facility])      Past Surgical History:  Procedure Laterality Date  . ABDOMINAL HYSTERECTOMY    . APPENDECTOMY    . COLONOSCOPY N/A 06/03/2019   Procedure: COLONOSCOPY;  Surgeon: Toledo, Benay Pike, MD;  Location: [facility] ENDOSCOPY;  Service: Gastroenterology;  Laterality: N/A;  . EPIGASTRIC HERNIA REPAIR N/A 03/21/2020   Procedure: HERNIA REPAIR EPIGASTRIC ADULT, Open;  Surgeon: [name], MD;  Location: [facility] ORS;  Service: General;  Laterality: N/A;  . ESOPHAGOGASTRODUODENOSCOPY N/A 01/29/2019   Procedure: ESOPHAGOGASTRODUODENOSCOPY (EGD);  Surgeon: [name], MD;  Location: [facility] ENDOSCOPY;  Service: Gastroenterology;  Laterality: N/A;  . ESOPHAGOGASTRODUODENOSCOPY N/A 06/01/2019   Procedure: ESOPHAGOGASTRODUODENOSCOPY (EGD);  Surgeon: Toledo, Benay Pike, MD;  Location: [facility] ENDOSCOPY;  Service: Gastroenterology;  Laterality: N/A;  . IR PARACENTESIS  08/27/2019  . IR RADIOLOGIST EVAL & MGMT  08/08/2019  . IR RADIOLOGIST EVAL & MGMT  11/14/2019  . IR TIPS  08/27/2019  . RADIOLOGY WITH ANESTHESIA N/A 08/27/2019   Procedure: IR WITH ANESTHESIA TIPS PROCEDURE;  Surgeon: [name], MD;  Location: [place];  Service: Radiology;  Laterality: N/A;  . TONSILLECTOMY       Prior to Admission  medications   Medication Sig Start Date End Date Taking? Authorizing Provider  digoxin (LANOXIN) 0.125 MG tablet Take 1 tablet by mouth once daily 05/22/20   Minna Merritts, MD  FLUoxetine (PROZAC) 20 MG tablet Take 1 tablet (20 mg total) by mouth daily. 01/24/20   Towanda Malkin, MD  furosemide (LASIX) 40 MG tablet Take 0.5 tablets (20 mg total) by mouth daily. 10/03/19   Minna Merritts,  MD  gabapentin (NEURONTIN) 400 MG capsule Take 1 capsule (400 mg total) by mouth 3 (three) times daily. 05/29/20   Towanda Malkin, MD  lactulose (CHRONULAC) 10 GM/15ML solution Take 45 mLs (30 g total) by mouth 3 (three) times daily. 08/28/19   [name], NP  metoprolol succinate (TOPROL-XL) 25 MG 24 hr tablet Take 25 mg by mouth daily.    [provider]  midodrine (PROAMATINE) 5 MG tablet Take 1 tablet (5 mg total) by mouth 3 (three) times daily with meals. 01/29/20   Minna Merritts, MD  pantoprazole (PROTONIX) 20 MG tablet Take 1 tablet (20 mg total) by mouth every morning. One hour before breakfast 02/26/20   Delsa Grana, PA-C  tiZANidine (ZANAFLEX) 4 MG capsule TAKE 1 CAPSULE BY MOUTH THREE TIMES DAILY AS NEEDED FOR MUSCLE SPASM 05/29/20   Towanda Malkin, MD     Allergies Patient has no known allergies.   Family History  Problem Relation Age of Onset  . Cancer Mother   . Cancer Father     Social History Social History   Tobacco Use  . Smoking status: Current Every Day Smoker    Packs/day: 1.00    Years: 35.00    Pack years: 35.00    Types: Cigarettes  . Smokeless tobacco: Never Used  Vaping Use  . Vaping Use: Never used  Substance Use Topics  . Alcohol use: Not Currently  . Drug use: Never    Review of Systems Level 5 Caveat: Portions of the History and Physical including HPI and review of systems are unable to be completely obtained due to patient being a poor historian   Constitutional:   No known fever.  ENT:   No rhinorrhea. Cardiovascular:   No chest pain or syncope. Respiratory:   No dyspnea or cough. Gastrointestinal:   Positive for abdominal pain and vomiting Musculoskeletal:   Negative for focal pain or swelling ____________________________________________   PHYSICAL EXAM:  VITAL SIGNS: ED Triage Vitals  Enc Vitals Group     BP 05/30/20 0759 112/67     Pulse Rate 05/30/20 0759 98     Resp 05/30/20 0759 20      Temp 05/30/20 0759 97.8 F (36.6 C)     Temp Source 05/30/20 0759 Oral     SpO2 05/30/20 0759 96 %     Weight 05/30/20 0744 127 lb (57.6 kg)     Height 05/30/20 0744 5\' 9"  (1.753 m)     Head Circumference --      Peak Flow --      Pain Score 05/30/20 0744 10     Pain Loc --      Pain Edu? --      Excl. in [place]? --     Vital signs reviewed, nursing assessments reviewed.   Constitutional: Somnolent but arousable..  Oriented to person and place.  Ill-appearing Eyes:   Conjunctivae are normal. EOMI. PERRL. ENT      Head:   Normocephalic and atraumatic.      Nose:  No congestion/rhinnorhea.       Mouth/Throat:   Dry mucous membranes, no pharyngeal erythema. No peritonsillar mass.       Neck:   No meningismus. Full ROM. Hematological/Lymphatic/Immunilogical:   No cervical lymphadenopathy. Cardiovascular:   RRR. Symmetric bilateral radial and DP pulses.  No murmurs. Cap refill less than 2 seconds. Respiratory:   Normal respiratory effort without tachypnea/retractions. Breath sounds are clear and equal bilaterally. No wheezes/rales/rhonchi. Gastrointestinal:   Soft with epigastric tenderness. Non distended. There is no CVA tenderness.  No rebound, rigidity, or guarding.  Rectal exam reveals scant brown stool, Hemoccult negative  Musculoskeletal:   Normal range of motion in all extremities. No joint effusions.  No lower extremity tenderness.  No edema. Neurologic:   Normal speech and language.  Motor grossly intact.  Skin:    Skin is warm, dry and intact. No rash noted.  No petechiae, purpura, or bullae.  ____________________________________________    LABS (pertinent positives/negatives) (all labs ordered are listed, but only abnormal results are displayed) Labs Reviewed  CBC - Abnormal; Notable for the following components:      Result Value   WBC 16.2 (*)    Hemoglobin 16.3 (*)    HCT 47.3 (*)    RDW 16.6 (*)    All other components within normal limits  COMPREHENSIVE  METABOLIC PANEL - Abnormal; Notable for the following components:   Potassium 3.0 (*)    Total Protein 6.4 (*)    AST 58 (*)    Alkaline Phosphatase 228 (*)    Total Bilirubin 4.6 (*)    All other components within normal limits  PROTIME-INR - Abnormal; Notable for the following components:   Prothrombin Time 15.9 (*)    INR 1.3 (*)    All other components within normal limits  GLUCOSE, CAPILLARY - Abnormal; Notable for the following components:   Glucose-Capillary 140 (*)    All other components within normal limits  LACTIC ACID, PLASMA - Abnormal; Notable for the following components:   Lactic Acid, Venous 2.6 (*)    All other components within normal limits  CBC WITH DIFFERENTIAL/PLATELET - Abnormal; Notable for the following components:   WBC 10.7 (*)    RBC 3.68 (*)    HCT 35.8 (*)    RDW 16.9 (*)    Monocytes Absolute 1.1 (*)    Abs Immature Granulocytes 0.08 (*)    All other components within normal limits  AMMONIA - Abnormal; Notable for the following components:   Ammonia 45 (*)    All other components within normal limits  TROPONIN I (HIGH SENSITIVITY) - Abnormal; Notable for the following components:   Troponin I (High Sensitivity) 60 (*)    All other components within normal limits  TROPONIN I (HIGH SENSITIVITY) - Abnormal; Notable for the following components:   Troponin I (High Sensitivity) 56 (*)    All other components within normal limits  CULTURE, BLOOD (ROUTINE X 2)  CULTURE, BLOOD (ROUTINE X 2)  RESPIRATORY PANEL BY RT PCR (FLU A&B, COVID)  CULTURE, BLOOD (SINGLE)  ETHANOL  LACTIC ACID, PLASMA  COMPREHENSIVE METABOLIC PANEL  TYPE AND SCREEN  TYPE AND SCREEN   ____________________________________________   EKG    ____________________________________________    RADIOLOGY  CT Angio Chest/Abd/Pel for Dissection W and/or Wo Contrast  Result Date: 05/30/2020 CLINICAL DATA:  Abdominal pain, aortic dissection suspected Patient reports abdominal  pain with dizziness and nausea and vomiting. Weight loss. EXAM: CT ANGIOGRAPHY CHEST, ABDOMEN AND PELVIS TECHNIQUE: Non-contrast  CT of the chest was initially obtained. Multidetector CT imaging through the chest, abdomen and pelvis was performed using the standard protocol during bolus administration of intravenous contrast. Multiplanar reconstructed images and MIPs were obtained and reviewed to evaluate the vascular anatomy. CONTRAST:  118mL OMNIPAQUE IOHEXOL 350 MG/ML SOLN COMPARISON:  Abdominopelvic CT and liver MRI November 2020 FINDINGS: CTA CHEST FINDINGS Cardiovascular: Noncontrast exam without aortic hematoma. Mild atheromatous plaque primarily calcified without dissection, aneurysm, or evidence of acute aortic syndrome. Conventional branching pattern from the aortic arch. There is no central pulmonary embolus to the segmental level. Upper normal heart size with mild right heart dilatation. No pericardial effusion. Mediastinum/Nodes: Mild distal esophageal wall thickening. No enlarged mediastinal or hilar lymph nodes. No visualized thyroid nodule. Lungs/Pleura: Elevation of the left hemidiaphragm with dependent left lower lobe consolidation. Hypoventilatory changes in the dependent lungs. Mild apical emphysema. No findings of pulmonary edema. No significant pleural effusion. Trachea and central bronchi are patent. Musculoskeletal: There are no acute or suspicious osseous abnormalities. Generalized paucity of body fat. Review of the MIP images confirms the above findings. CTA ABDOMEN AND PELVIS FINDINGS VASCULAR Aorta: Normal caliber aorta without aneurysm, dissection, vasculitis or significant stenosis. Moderate calcified and noncalcified atheromatous plaque. Celiac: Patent without evidence of aneurysm, dissection, vasculitis or significant stenosis. SMA: Patent without evidence of aneurysm, dissection, vasculitis or significant stenosis. Replaced right hepatic artery arises from the SMA. Renals: Early  branching of right renal artery. Single left renal artery. Bilateral renal arteries are patent. IMA: Patent. Inflow: Patent without evidence of aneurysm, dissection, vasculitis or significant stenosis. Moderate atherosclerosis. Veins: Circumaortic left renal vein. TIPS in the liver which is not well assessed on this arterial phase exam. Review of the MIP images confirms the above findings. NON-VASCULAR Hepatobiliary: Cirrhosis with nodular hepatic contours. TIPS in place, patency not well assessed on this arterial phase exam. There is no evidence of arterially enhancing focal lesion. Similar appearance of gallbladder and biliary dilatation from prior exam. No calcified gallstone. No pericholecystic inflammation or gallbladder wall thickening. Pancreas: Parenchymal atrophy.  Insert Spleen: Improved splenomegaly, spleen currently spans 12.8 cm cranial caudal, previously 14.4 cm. No focal abnormality on arterial phase imaging. Adrenals/Urinary Tract: No adrenal nodule. No hydronephrosis or perinephric edema. Urinary bladder is minimally distended. No bladder wall thickening. Stomach/Bowel: Decompressed stomach. No evidence of gastric or duodenal wall thickening. Small duodenal diverticulum arising from the third portion. No small bowel obstruction or inflammatory change. Low lying cecum in the midline pelvis. Appendix not confidently visualized. Moderate volume of colonic stool. There is no colonic wall thickening. No abnormal rectal distention. Lymphatic: No enlarged abdominopelvic lymph nodes. Reproductive: Status post hysterectomy. No adnexal masses. Other: Trace ascites in the left upper quadrant, volume amenable to paracentesis. No free air. Musculoskeletal: There are no acute or suspicious osseous abnormalities. Review of the MIP images confirms the above findings. IMPRESSION: 1. Thoracoabdominal aortic atherosclerosis without dissection or acute aortic abnormality. No central pulmonary embolus. 2. Consolidation  in the dependent left lower lobe with associated elevated hemidiaphragm, atelectasis versus pneumonia. 3. Cirrhosis with nodular hepatic contours. TIPS in place, patency not well assessed on this arterial phase exam. Trace ascites in the left upper quadrant. 4. Similar appearance of gallbladder and biliary dilatation from prior exam. Aortic Atherosclerosis (ICD10-I70.0) and Emphysema (ICD10-J43.9). Electronically Signed   By: Keith Rake M.D.   On: 05/30/2020 22:23   US ABDOMEN LIMITED RUQ  Result Date: 05/30/2020 CLINICAL DATA:  Epigastric pain EXAM: ULTRASOUND ABDOMEN LIMITED RIGHT UPPER QUADRANT  COMPARISON:  CT 05/30/2020 FINDINGS: Gallbladder: Distended gallbladder without shadowing stone. Normal wall thickness. Negative sonographic Murphy. Common bile duct: Diameter: Slightly enlarged at 7.4 mm Liver: Cirrhotic morphology of the liver. Calcification in the right hepatic lobe. Portal vein is patent on color Doppler imaging with normal direction of blood flow towards the liver. Patient has history of tips. The tips shunt was not specifically evaluated. Other: None. IMPRESSION: 1. Distended gallbladder without shadowing stone or other suspicious sonographic features. 2. Slightly enlarged common bile duct. Electronically Signed   By: Donavan Foil M.D.   On: 05/30/2020 23:27    ____________________________________________   PROCEDURES .Critical Care Performed by: Carrie Mew, MD Authorized by: Carrie Mew, MD   Critical care provider statement:    Critical care time (minutes):  35   Critical care time was exclusive of:  Separately billable procedures and treating other patients   Critical care was necessary to treat or prevent imminent or life-threatening deterioration of the following conditions:  Sepsis, shock and CNS failure or compromise   Critical care was time spent personally by me on the following activities:  Development of treatment plan with patient or surrogate,  discussions with consultants, evaluation of patient's response to treatment, examination of patient, obtaining history from patient or surrogate, ordering and performing treatments and interventions, ordering and review of laboratory studies, ordering and review of radiographic studies, pulse oximetry, re-evaluation of patient's condition and review of old charts Comments:        Angiocath insertion  Date/Time: 05/30/2020 11:40 PM Performed by: Carrie Mew, MD Authorized by: Carrie Mew, MD  Preparation: Patient was prepped and draped in the usual sterile fashion. Local anesthesia used: no  Anesthesia: Local anesthesia used: no  Sedation: Patient sedated: no  Patient tolerance: patient tolerated the procedure well with no immediate complications Comments: [place] vis, 20g, L upper arm, 2 attempts, EBL 0     ____________________________________________  DIFFERENTIAL DIAGNOSIS   Aortic dissection, biliary disease, pneumonia, UTI, sepsis, hepatic encephalopathy, electrolyte normality  CLINICAL IMPRESSION / ASSESSMENT AND PLAN / ED COURSE  Medications ordered in the ED: Medications  0.9 %  sodium chloride infusion (has no administration in time range)  norepinephrine (LEVOPHED) 4mg  in 227mL premix infusion (has no administration in time range)  sodium chloride 0.9 % bolus 1,000 mL (0 mLs Intravenous Stopped 05/30/20 2241)  lactated ringers bolus 1,000 mL (1,000 mLs Intravenous New Bag/Given 05/30/20 2139)  ceFEPIme (MAXIPIME) 2 g in sodium chloride 0.9 % 100 mL IVPB (0 g Intravenous Stopped 05/30/20 2237)  metroNIDAZOLE (FLAGYL) IVPB 500 mg (500 mg Intravenous New Bag/Given 05/30/20 2237)  vancomycin (VANCOCIN) IVPB 1000 mg/200 mL premix (1,000 mg Intravenous New Bag/Given 05/30/20 2240)  iohexol (OMNIPAQUE) 350 MG/ML injection 100 mL (100 mLs Intravenous Contrast Given 05/30/20 2155)    Pertinent labs & imaging results that were available during my care of the patient were  reviewed by me and considered in my medical decision making (see chart for details).   Anita Reed was evaluated in Emergency Department on 05/30/2020 for the symptoms described in the history of present illness. She was evaluated in the context of the global COVID-19 pandemic, which necessitated consideration that the patient might be at risk for infection with the SARS-CoV-2 virus that causes COVID-19. Institutional protocols and algorithms that pertain to the evaluation of patients at risk for COVID-19 are in a state of rapid change based on information released by regulatory bodies including the CDC and federal and state organizations. These policies  and algorithms were followed during the patient's care in the ED.   Patient presents with abdominal pain and vomiting in the setting of cirrhosis status post TIPS.  Developed hypotension in the waiting room, altered mental status.  Has a leukocytosis on labs with mild elevation of her bilirubin as well.  CT angiogram chest abdomen pelvis obtained which is negative for dissection, suggest a basilar pneumonia.  No apparent biliary disease or acute intra-abdominal process.  Patient remains hypotensive.  Clinical Course as of May 30 2340  Fri May 30, 2020  2258 Pt has had 1576ml fluid bolus so far. MAP currently 64. Will continue IVF.    [PS]  2326 Pt still has BP of 70/55. Despite 58ml/kg IVF bolus being completed. Sepsis reassessment has been completed.  Will need to start levophed for refractory septic shock and admit to ICU.    [PS]    Clinical Course User Index [PS] Carrie Mew, MD     ____________________________________________   FINAL CLINICAL IMPRESSION(S) / ED DIAGNOSES    Final diagnoses:  Septic shock ([place])  Cirrhosis of liver without ascites, unspecified hepatic cirrhosis type [facility])     ED Discharge Orders    None      Portions of this note were generated with dragon dictation software. Dictation errors may occur  despite best attempts at proofreading.   Carrie Mew, MD 05/30/20 [phone]

## 2020-05-30 NOTE — Telephone Encounter (Signed)
Husband calling to ask if Dr Roxan Hockey will call the pharmacy and approve pt refills. Pharmacy had told her they were too early. Please advise.  gabapentin (NEURONTIN) 400 MG capsule tiZANidine (ZANAFLEX) 4 MG capsule  Called [facility] to verify this information. Pharmacist states pt got these refilled on 04/07/20- 90 day rx. Not due until 07/08/20  Pt is at the ED now.   Sumner [place], [place] Phone:  [phone]  Fax:  [phone]

## 2020-05-30 NOTE — Progress Notes (Signed)
CODE SEPSIS - PHARMACY COMMUNICATION  **Broad Spectrum Antibiotics should be administered within 1 hour of Sepsis diagnosis**  Time Code Sepsis Called/Page Received: 2152  Antibiotics Ordered: cefepime/vancomycin/metronidazole  Time of 1st antibiotic administration: 2220  Additional action taken by pharmacy: n/a  Benita Gutter 05/30/2020  10:25 PM

## 2020-05-30 NOTE — ED Notes (Signed)
Pt states coming in for abdominal pain. Pt was asleep and did not wake up when RN was palpating abdomen and ultrasound at bedside. RN woke up pt and then pt started to say she had abdominal pain. Pt resting in the room on cardiac, bp and pulse ox monitor.

## 2020-05-30 NOTE — Consult Note (Signed)
PHARMACY -  BRIEF ANTIBIOTIC NOTE   Pharmacy has received consult(s) for cefepime and vancomycin from an ED provider. Patient is also ordered metronidazole. The patient's profile has been reviewed for ht/wt/allergies/indication/available labs.    One time order(s) placed for --Vancomycin 1 g (already ordered) --Cefepime 2 g (already ordered)  Further antibiotics/pharmacy consults should be ordered by admitting physician if indicated.                       Thank you, Benita Gutter 05/30/2020  9:50 PM

## 2020-05-30 NOTE — Telephone Encounter (Signed)
Yes, it I ok to refill presently. Thanks

## 2020-05-30 NOTE — Telephone Encounter (Signed)
I was unable to reach a pharmacist by phone so I left a message on their prescription voicemail. I gave authorization per Dr. Roxan Hockey that patient's two prescriptions Tizanidine and Gabapentin could be filled early.

## 2020-05-30 NOTE — ED Triage Notes (Signed)
Pt reports abd pain, dizziness and nv. Pt states was here but left due to wait.

## 2020-05-30 NOTE — ED Notes (Signed)
Patient transported to and from CT with RN on monitor

## 2020-05-30 NOTE — ED Notes (Signed)
CBG 140  

## 2020-05-30 NOTE — ED Notes (Addendum)
Pt to room at this time with BP of 46/20  Pt very sleepy but wakes with loud verbal stimulus, pt lifted from wheelchair to bed

## 2020-05-30 NOTE — Telephone Encounter (Signed)
Ok to refill early? Patient at ED now.

## 2020-05-30 NOTE — ED Triage Notes (Signed)
Pt reports has also had some weight lose over the last few weeks. Pt reports that she take dilaudid for pain and not morphine or anything else. Pt reports pain is severe over her body

## 2020-05-31 DIAGNOSIS — T50905A Adverse effect of unspecified drugs, medicaments and biological substances, initial encounter: Secondary | ICD-10-CM | POA: Diagnosis present

## 2020-05-31 DIAGNOSIS — F17210 Nicotine dependence, cigarettes, uncomplicated: Secondary | ICD-10-CM | POA: Diagnosis present

## 2020-05-31 DIAGNOSIS — Z809 Family history of malignant neoplasm, unspecified: Secondary | ICD-10-CM | POA: Diagnosis not present

## 2020-05-31 DIAGNOSIS — R1110 Vomiting, unspecified: Secondary | ICD-10-CM | POA: Diagnosis not present

## 2020-05-31 DIAGNOSIS — D500 Iron deficiency anemia secondary to blood loss (chronic): Secondary | ICD-10-CM | POA: Diagnosis present

## 2020-05-31 DIAGNOSIS — Z515 Encounter for palliative care: Secondary | ICD-10-CM | POA: Diagnosis not present

## 2020-05-31 DIAGNOSIS — E876 Hypokalemia: Secondary | ICD-10-CM | POA: Diagnosis present

## 2020-05-31 DIAGNOSIS — K7460 Unspecified cirrhosis of liver: Secondary | ICD-10-CM | POA: Diagnosis present

## 2020-05-31 DIAGNOSIS — I959 Hypotension, unspecified: Secondary | ICD-10-CM | POA: Diagnosis not present

## 2020-05-31 DIAGNOSIS — Z20822 Contact with and (suspected) exposure to covid-19: Secondary | ICD-10-CM | POA: Diagnosis present

## 2020-05-31 DIAGNOSIS — J189 Pneumonia, unspecified organism: Secondary | ICD-10-CM | POA: Insufficient documentation

## 2020-05-31 DIAGNOSIS — R6521 Severe sepsis with septic shock: Secondary | ICD-10-CM | POA: Diagnosis present

## 2020-05-31 DIAGNOSIS — R1013 Epigastric pain: Secondary | ICD-10-CM | POA: Diagnosis not present

## 2020-05-31 DIAGNOSIS — R112 Nausea with vomiting, unspecified: Secondary | ICD-10-CM | POA: Diagnosis not present

## 2020-05-31 DIAGNOSIS — F419 Anxiety disorder, unspecified: Secondary | ICD-10-CM | POA: Diagnosis not present

## 2020-05-31 DIAGNOSIS — I10 Essential (primary) hypertension: Secondary | ICD-10-CM | POA: Diagnosis present

## 2020-05-31 DIAGNOSIS — A419 Sepsis, unspecified organism: Secondary | ICD-10-CM | POA: Diagnosis present

## 2020-05-31 DIAGNOSIS — K7031 Alcoholic cirrhosis of liver with ascites: Secondary | ICD-10-CM | POA: Diagnosis not present

## 2020-05-31 DIAGNOSIS — Z66 Do not resuscitate: Secondary | ICD-10-CM | POA: Diagnosis not present

## 2020-05-31 DIAGNOSIS — E43 Unspecified severe protein-calorie malnutrition: Secondary | ICD-10-CM | POA: Diagnosis present

## 2020-05-31 DIAGNOSIS — K219 Gastro-esophageal reflux disease without esophagitis: Secondary | ICD-10-CM | POA: Diagnosis present

## 2020-05-31 DIAGNOSIS — Z681 Body mass index (BMI) 19 or less, adult: Secondary | ICD-10-CM | POA: Diagnosis not present

## 2020-05-31 DIAGNOSIS — K766 Portal hypertension: Secondary | ICD-10-CM | POA: Diagnosis present

## 2020-05-31 DIAGNOSIS — R110 Nausea: Secondary | ICD-10-CM | POA: Diagnosis not present

## 2020-05-31 DIAGNOSIS — J439 Emphysema, unspecified: Secondary | ICD-10-CM | POA: Diagnosis not present

## 2020-05-31 DIAGNOSIS — K7030 Alcoholic cirrhosis of liver without ascites: Secondary | ICD-10-CM | POA: Diagnosis not present

## 2020-05-31 DIAGNOSIS — I4892 Unspecified atrial flutter: Secondary | ICD-10-CM | POA: Diagnosis present

## 2020-05-31 DIAGNOSIS — G928 Other toxic encephalopathy: Secondary | ICD-10-CM | POA: Diagnosis present

## 2020-05-31 DIAGNOSIS — I517 Cardiomegaly: Secondary | ICD-10-CM | POA: Diagnosis not present

## 2020-05-31 DIAGNOSIS — Z7189 Other specified counseling: Secondary | ICD-10-CM | POA: Diagnosis not present

## 2020-05-31 DIAGNOSIS — I4819 Other persistent atrial fibrillation: Secondary | ICD-10-CM | POA: Diagnosis present

## 2020-05-31 DIAGNOSIS — J90 Pleural effusion, not elsewhere classified: Secondary | ICD-10-CM | POA: Diagnosis not present

## 2020-05-31 DIAGNOSIS — I8510 Secondary esophageal varices without bleeding: Secondary | ICD-10-CM | POA: Diagnosis present

## 2020-05-31 LAB — URINE DRUG SCREEN, QUALITATIVE (ARMC ONLY)
Amphetamines, Ur Screen: NOT DETECTED
Barbiturates, Ur Screen: NOT DETECTED
Benzodiazepine, Ur Scrn: NOT DETECTED
Cannabinoid 50 Ng, Ur ~~LOC~~: NOT DETECTED
Cocaine Metabolite,Ur ~~LOC~~: NOT DETECTED
MDMA (Ecstasy)Ur Screen: NOT DETECTED
Methadone Scn, Ur: NOT DETECTED
Opiate, Ur Screen: NOT DETECTED
Phencyclidine (PCP) Ur S: NOT DETECTED
Tricyclic, Ur Screen: NOT DETECTED

## 2020-05-31 LAB — BASIC METABOLIC PANEL
Anion gap: 8 (ref 5–15)
BUN: 13 mg/dL (ref 6–20)
CO2: 26 mmol/L (ref 22–32)
Calcium: 8.2 mg/dL — ABNORMAL LOW (ref 8.9–10.3)
Chloride: 104 mmol/L (ref 98–111)
Creatinine, Ser: 0.76 mg/dL (ref 0.44–1.00)
GFR calc Af Amer: >60 mL/min (ref >60)
GFR calc non Af Amer: >60 mL/min (ref >60)
Glucose, Bld: 97 mg/dL (ref 70–99)
Potassium: 3.3 mmol/L — ABNORMAL LOW (ref 3.5–5.1)
Sodium: 138 mmol/L (ref 135–145)

## 2020-05-31 LAB — COMPREHENSIVE METABOLIC PANEL
ALT: 21 U/L (ref 0–44)
AST: 54 U/L — ABNORMAL HIGH (ref 15–41)
Albumin: 3.1 g/dL — ABNORMAL LOW (ref 3.5–5.0)
Alkaline Phosphatase: 206 U/L — ABNORMAL HIGH (ref 38–126)
Anion gap: 10 (ref 5–15)
BUN: 12 mg/dL (ref 6–20)
CO2: 24 mmol/L (ref 22–32)
Calcium: 8.3 mg/dL — ABNORMAL LOW (ref 8.9–10.3)
Chloride: 103 mmol/L (ref 98–111)
Creatinine, Ser: 1.04 mg/dL — ABNORMAL HIGH (ref 0.44–1.00)
GFR calc Af Amer: >60 mL/min (ref >60)
GFR calc non Af Amer: 59 mL/min — ABNORMAL LOW (ref >60)
Glucose, Bld: 143 mg/dL — ABNORMAL HIGH (ref 70–99)
Potassium: 3.4 mmol/L — ABNORMAL LOW (ref 3.5–5.1)
Sodium: 137 mmol/L (ref 135–145)
Total Bilirubin: 5.2 mg/dL — ABNORMAL HIGH (ref 0.3–1.2)
Total Protein: 5.8 g/dL — ABNORMAL LOW (ref 6.5–8.1)

## 2020-05-31 LAB — CBC
HCT: 38.4 % (ref 36.0–46.0)
Hemoglobin: 13.7 g/dL (ref 12.0–15.0)
MCH: 33 pg (ref 26.0–34.0)
MCHC: 35.7 g/dL (ref 30.0–36.0)
MCV: 92.5 fL (ref 80.0–100.0)
Platelets: 202 10*3/uL (ref 150–400)
RBC: 4.15 MIL/uL (ref 3.87–5.11)
RDW: 16.9 % — ABNORMAL HIGH (ref 11.5–15.5)
WBC: 13.8 10*3/uL — ABNORMAL HIGH (ref 4.0–10.5)
nRBC: 0 % (ref 0.0–0.2)

## 2020-05-31 LAB — TYPE AND SCREEN
ABO/RH(D): O POS
Antibody Screen: NEGATIVE

## 2020-05-31 LAB — PHOSPHORUS: Phosphorus: 3.5 mg/dL (ref 2.5–4.6)

## 2020-05-31 LAB — STREP PNEUMONIAE URINARY ANTIGEN: Strep Pneumo Urinary Antigen: NEGATIVE

## 2020-05-31 LAB — LACTIC ACID, PLASMA: Lactic Acid, Venous: 3 mmol/L (ref 0.5–1.9)

## 2020-05-31 LAB — MAGNESIUM: Magnesium: 1.7 mg/dL (ref 1.7–2.4)

## 2020-05-31 MED ORDER — METRONIDAZOLE IN NACL 5-0.79 MG/ML-% IV SOLN
500.0000 mg | Freq: Three times a day (TID) | INTRAVENOUS | Status: DC
  Administered 2020-05-31 – 2020-06-01 (×4): 500 mg via INTRAVENOUS
  Filled 2020-05-31 (×4): qty 100

## 2020-05-31 MED ORDER — VANCOMYCIN HCL 500 MG/100ML IV SOLN
500.0000 mg | Freq: Two times a day (BID) | INTRAVENOUS | Status: DC
  Administered 2020-05-31: 500 mg via INTRAVENOUS
  Filled 2020-05-31 (×2): qty 100

## 2020-05-31 MED ORDER — VANCOMYCIN HCL 750 MG/150ML IV SOLN
750.0000 mg | Freq: Two times a day (BID) | INTRAVENOUS | Status: DC
  Administered 2020-06-01: 750 mg via INTRAVENOUS
  Filled 2020-05-31 (×4): qty 150

## 2020-05-31 MED ORDER — DIGOXIN 125 MCG PO TABS
125.0000 ug | ORAL_TABLET | Freq: Every day | ORAL | Status: DC
  Administered 2020-05-31 – 2020-06-03 (×4): 125 ug via ORAL
  Filled 2020-05-31 (×4): qty 1

## 2020-05-31 MED ORDER — PANTOPRAZOLE SODIUM 40 MG IV SOLR
40.0000 mg | Freq: Every day | INTRAVENOUS | Status: DC
  Administered 2020-05-31 – 2020-06-02 (×4): 40 mg via INTRAVENOUS
  Filled 2020-05-31 (×4): qty 40

## 2020-05-31 MED ORDER — POLYETHYLENE GLYCOL 3350 17 G PO PACK: 17.0000 g | PACK | Freq: Every day | ORAL | Status: DC | PRN

## 2020-05-31 MED ORDER — FENTANYL CITRATE (PF) 100 MCG/2ML IJ SOLN: 12.5000 ug | INTRAMUSCULAR | Status: DC | PRN

## 2020-05-31 MED ORDER — MIDODRINE HCL 5 MG PO TABS
5.0000 mg | ORAL_TABLET | Freq: Three times a day (TID) | ORAL | Status: DC
  Administered 2020-05-31 – 2020-06-01 (×4): 5 mg via ORAL
  Filled 2020-05-31 (×6): qty 1

## 2020-05-31 MED ORDER — POTASSIUM CHLORIDE 20 MEQ PO PACK
40.0000 meq | PACK | Freq: Once | ORAL | Status: AC
  Administered 2020-05-31: 40 meq via ORAL
  Filled 2020-05-31: qty 2

## 2020-05-31 MED ORDER — ONDANSETRON HCL 4 MG/2ML IJ SOLN: 4.0000 mg | Freq: Four times a day (QID) | INTRAMUSCULAR | Status: DC | PRN

## 2020-05-31 MED ORDER — TIZANIDINE HCL 4 MG PO TABS
4.0000 mg | ORAL_TABLET | Freq: Three times a day (TID) | ORAL | Status: DC | PRN
  Administered 2020-06-01 – 2020-06-03 (×3): 4 mg via ORAL
  Filled 2020-05-31 (×10): qty 1

## 2020-05-31 MED ORDER — GABAPENTIN 400 MG PO CAPS
400.0000 mg | ORAL_CAPSULE | Freq: Three times a day (TID) | ORAL | Status: DC
  Administered 2020-05-31 – 2020-06-03 (×9): 400 mg via ORAL
  Filled 2020-05-31 (×11): qty 1
  Filled 2020-05-31: qty 4
  Filled 2020-05-31 (×2): qty 1

## 2020-05-31 MED ORDER — SODIUM CHLORIDE 0.9 % IV SOLN
2.0000 g | Freq: Three times a day (TID) | INTRAVENOUS | Status: DC
  Administered 2020-05-31 – 2020-06-01 (×4): 2 g via INTRAVENOUS
  Filled 2020-05-31 (×4): qty 2

## 2020-05-31 MED ORDER — POTASSIUM CHLORIDE CRYS ER 20 MEQ PO TBCR
20.0000 meq | EXTENDED_RELEASE_TABLET | ORAL | Status: AC
  Administered 2020-05-31 (×2): 20 meq via ORAL
  Filled 2020-05-31 (×2): qty 1

## 2020-05-31 MED ORDER — FLUOXETINE HCL 20 MG PO CAPS
20.0000 mg | ORAL_CAPSULE | Freq: Every day | ORAL | Status: DC
  Administered 2020-05-31 – 2020-06-03 (×4): 20 mg via ORAL
  Filled 2020-05-31 (×4): qty 1

## 2020-05-31 MED ORDER — POTASSIUM CHLORIDE 10 MEQ/100ML IV SOLN
10.0000 meq | INTRAVENOUS | Status: AC
  Administered 2020-05-31 (×4): 10 meq via INTRAVENOUS
  Filled 2020-05-31 (×4): qty 100

## 2020-05-31 MED ORDER — DOCUSATE SODIUM 100 MG PO CAPS
100.0000 mg | ORAL_CAPSULE | Freq: Two times a day (BID) | ORAL | Status: DC | PRN
  Administered 2020-06-02: 100 mg via ORAL
  Filled 2020-05-31: qty 1

## 2020-05-31 MED ORDER — LACTATED RINGERS IV BOLUS
1000.0000 mL | INTRAVENOUS | Status: AC
  Administered 2020-05-31 (×2): 1000 mL via INTRAVENOUS

## 2020-05-31 NOTE — Progress Notes (Signed)
PIV consult discussed with RN. Cancel consult for now, RN will re-enter if needed.

## 2020-05-31 NOTE — ED Notes (Signed)
Date and time results received: 05/31/20 0103 (use smartphrase ".now" to insert current time)  Test: Lactic Acid Critical Value: 3.0  Name of Provider Notified: Magdalene Tukov-yual  Orders Received? Or Actions Taken?: provider notified

## 2020-05-31 NOTE — Progress Notes (Signed)
PHARMACY CONSULT NOTE - FOLLOW UP  Pharmacy Consult for Electrolyte Monitoring and Replacement   Recent Labs: Potassium (mmol/L)  Date Value  05/31/2020 3.3 (L)   Magnesium (mg/dL)  Date Value  05/31/2020 1.7   Calcium (mg/dL)  Date Value  05/31/2020 8.2 (L)   Albumin (g/dL)  Date Value  05/31/2020 3.1 (L)   Phosphorus (mg/dL)  Date Value  05/31/2020 3.5   Sodium (mmol/L)  Date Value  05/31/2020 138   Assessment: K 3.3  Goal of Therapy:  K >/= 4  Plan:  KCl 20 meq PO q4h x 2 doses KCl 10 meq IV q1h x 4 doses F/U with next lab draw   Mills Koller ,PharmD Clinical Pharmacist 05/31/2020 11:43 AM

## 2020-05-31 NOTE — ED Notes (Signed)
Report to amber, rn.  

## 2020-05-31 NOTE — H&P (Signed)
PULMONARY / CRITICAL CARE MEDICINE  Name: Anita Reed MRN: [id number] DOB: 08-31-1960    LOS: 0 Admitting Provider:  Arlyss Gandy. Tukov-Yual Reason for Admission:  Septic shock Brief patient description: 59 y/o female with cirrhosis s/p TIPS procedure admitted with septic shock secondary to pneumonia requiring pressors.  HPI:  This is a 59 y/o female with a medical history as indicated below who presented to the ED with c/o abdominal pain, dizziness and nausea and vomiting that started a few weeks ago. She was in the ED the day before but left due to long wait time. Initially she was hemodynamically stable per ED MD. She then became acute hypotensive with a BP of 46/20 requiring pressors and IV fluids. ED workup is significant for K+ level of 3.4, lactic acid 3.0, wbc 16.2 and his CT chest showed consolidation in the LLL lung and trace ascites. She is being admitted to the ICU for further management.  She had TIPS procedure on 08/27/19. No GI bleed since procedure. STOOL for OB negative today.   At time of assessment patient is more alert and her blood pressure has improved to the 962I systolic. She states that low blood pressures are a frequent occurrence for her and that this happens when she takes "all her medications at once". Per ED, patient has 3 bags of medications with her and will not allow ED staff to review what the medications are.  Every time she wakes up she requests pain medication. Patient was talking to me and falling asleep yet she kept insisting she needed pain medication. She denies chest pain, palpitations, nausea and vomiting. Patient is on clear liquids and wants to know if she can eat in the morning.   Past Medical History:  Diagnosis Date  . Anemia   . Atrial flutter ([place])   . Cancer [facility])    lymphoma 11 yrs. ago  . Cirrhosis ([place])   . Dysrhythmia   . Esophageal varices ([place])   . GERD (gastroesophageal reflux disease)   . Hepatitis    hep C-resolved  . History  of hiatal hernia   . Hypertension   . Pneumonia   . Portal hypertension ([facility])    Past Surgical History:  Procedure Laterality Date  . ABDOMINAL HYSTERECTOMY    . APPENDECTOMY    . COLONOSCOPY N/A 06/03/2019   Procedure: COLONOSCOPY;  Surgeon: Toledo, Benay Pike, MD;  Location: [facility] ENDOSCOPY;  Service: Gastroenterology;  Laterality: N/A;  . EPIGASTRIC HERNIA REPAIR N/A 03/21/2020   Procedure: HERNIA REPAIR EPIGASTRIC ADULT, Open;  Surgeon: [name], MD;  Location: [facility] ORS;  Service: General;  Laterality: N/A;  . ESOPHAGOGASTRODUODENOSCOPY N/A 01/29/2019   Procedure: ESOPHAGOGASTRODUODENOSCOPY (EGD);  Surgeon: [name], MD;  Location: [facility] ENDOSCOPY;  Service: Gastroenterology;  Laterality: N/A;  . ESOPHAGOGASTRODUODENOSCOPY N/A 06/01/2019   Procedure: ESOPHAGOGASTRODUODENOSCOPY (EGD);  Surgeon: Toledo, Benay Pike, MD;  Location: [facility] ENDOSCOPY;  Service: Gastroenterology;  Laterality: N/A;  . IR PARACENTESIS  08/27/2019  . IR RADIOLOGIST EVAL & MGMT  08/08/2019  . IR RADIOLOGIST EVAL & MGMT  11/14/2019  . IR TIPS  08/27/2019  . RADIOLOGY WITH ANESTHESIA N/A 08/27/2019   Procedure: IR WITH ANESTHESIA TIPS PROCEDURE;  Surgeon: [name], MD;  Location: [place];  Service: Radiology;  Laterality: N/A;  . TONSILLECTOMY     No current facility-administered medications on file prior to encounter.   Current Outpatient Medications on File Prior to Encounter  Medication Sig  . digoxin (LANOXIN) 0.125 MG tablet Take 1  tablet by mouth once daily  . FLUoxetine (PROZAC) 20 MG tablet Take 1 tablet (20 mg total) by mouth daily.  . furosemide (LASIX) 40 MG tablet Take 0.5 tablets (20 mg total) by mouth daily.  [name] gabapentin (NEURONTIN) 400 MG capsule Take 1 capsule (400 mg total) by mouth 3 (three) times daily.  [name] lactulose (CHRONULAC) 10 GM/15ML solution Take 45 mLs (30 g total) by mouth 3 (three) times daily.  . metoprolol succinate (TOPROL-XL) 25 MG 24 hr tablet Take 25 mg by mouth  daily.  . midodrine (PROAMATINE) 5 MG tablet Take 1 tablet (5 mg total) by mouth 3 (three) times daily with meals.  . pantoprazole (PROTONIX) 20 MG tablet Take 1 tablet (20 mg total) by mouth every morning. One hour before breakfast  . tiZANidine (ZANAFLEX) 4 MG capsule TAKE 1 CAPSULE BY MOUTH THREE TIMES DAILY AS NEEDED FOR MUSCLE SPASM    Allergies No Known Allergies  Family History Family History  Problem Relation Age of Onset  . Cancer Mother   . Cancer Father    Social History  reports that she has been smoking cigarettes. She has a 35.00 pack-year smoking history. She has never used smokeless tobacco. She reports previous alcohol use. She reports that she does not use drugs.  Review Of Systems:   Constitutional: Negative for fever and chills.  HENT: Negative for congestion and rhinorrhea.  Eyes: Negative for redness and visual disturbance.  Respiratory: Negative for shortness of breath and wheezing.  Cardiovascular: Negative for chest pain and palpitations.  Gastrointestinal: Positive for nausea , vomiting, abdominal pain and constipation Genitourinary: Negative for dysuria and urgency.  Endocrine: Denies polyuria, polyphagia and heat intolerance Musculoskeletal: Negative for myalgias and arthralgias.  Skin: Negative for pallor and wound.  Neurological: Negative for dizziness and headaches   VITAL SIGNS: BP 115/73   Pulse 80   Temp 97.8 F (36.6 C) (Oral)   Resp 15   Ht 5\' 9"  (1.753 m)   Wt 57.6 kg   SpO2 91%   BMI 18.75 kg/m   HEMODYNAMICS:    VENTILATOR SETTINGS:    INTAKE / OUTPUT: I/O last 3 completed shifts: In: 1100 [IV Piggyback:1100] Out: 200 [Urine:200]  PHYSICAL EXAMINATION: General: NAD, alert and following commands HEENT: [place]/AT, PERRLA, trachea midline, no JVD Neuro: AAOX3, somnolent and able to respond to questions Cardiovascular:  AP regular, S1/S2, no MRG, +2 pulses, no edema Lungs: Bilateral breath sounds with rhonchi and  wheezing Abdomen: Non-distended, normal bowel sounds Musculoskeletal:  +rom, pain with touch of BL legs which she attributes to neuropathy Skin: warm and dry  LABS:  BMET Recent Labs  Lab 05/30/20 0800 05/31/20 0005 05/31/20 0422  NA 140 137 138  K 3.0* 3.4* 3.3*  CL 101 103 104  CO2 29 24 26   BUN 10 12 13   CREATININE 0.63 1.04* 0.76  GLUCOSE 97 143* 97    Electrolytes Recent Labs  Lab 05/30/20 0800 05/31/20 0005 05/31/20 0422  CALCIUM 9.0 8.3* 8.2*  MG  --   --  1.7  PHOS  --   --  3.5    CBC Recent Labs  Lab 05/30/20 0800 05/30/20 2123 05/31/20 0422  WBC 16.2* 10.7* 13.8*  HGB 16.3* 12.2 13.7  HCT 47.3* 35.8* 38.4  PLT 212 178 202    Coag's Recent Labs  Lab 05/30/20 0800  INR 1.3*    Sepsis Markers Recent Labs  Lab 05/30/20 2125 05/31/20 0005  LATICACIDVEN 2.6* 3.0*    ABG  No results for input(s): PHART, PCO2ART, PO2ART in the last 168 hours.  Liver Enzymes Recent Labs  Lab 05/28/20 1459 05/30/20 0800 05/31/20 0005  AST 53* 58* 54*  ALT 28 28 21   ALKPHOS 219* 228* 206*  BILITOT 5.0* 4.6* 5.2*  ALBUMIN 3.5 3.6 3.1*    Cardiac Enzymes No results for input(s): TROPONINI, PROBNP in the last 168 hours.  Glucose Recent Labs  Lab 05/30/20 2118  GLUCAP 140*    Imaging CT Angio Chest/Abd/Pel for Dissection W and/or Wo Contrast  Result Date: 05/30/2020 CLINICAL DATA:  Abdominal pain, aortic dissection suspected Patient reports abdominal pain with dizziness and nausea and vomiting. Weight loss. EXAM: CT ANGIOGRAPHY CHEST, ABDOMEN AND PELVIS TECHNIQUE: Non-contrast CT of the chest was initially obtained. Multidetector CT imaging through the chest, abdomen and pelvis was performed using the standard protocol during bolus administration of intravenous contrast. Multiplanar reconstructed images and MIPs were obtained and reviewed to evaluate the vascular anatomy. CONTRAST:  196mL OMNIPAQUE IOHEXOL 350 MG/ML SOLN COMPARISON:  Abdominopelvic  CT and liver MRI November 2020 FINDINGS: CTA CHEST FINDINGS Cardiovascular: Noncontrast exam without aortic hematoma. Mild atheromatous plaque primarily calcified without dissection, aneurysm, or evidence of acute aortic syndrome. Conventional branching pattern from the aortic arch. There is no central pulmonary embolus to the segmental level. Upper normal heart size with mild right heart dilatation. No pericardial effusion. Mediastinum/Nodes: Mild distal esophageal wall thickening. No enlarged mediastinal or hilar lymph nodes. No visualized thyroid nodule. Lungs/Pleura: Elevation of the left hemidiaphragm with dependent left lower lobe consolidation. Hypoventilatory changes in the dependent lungs. Mild apical emphysema. No findings of pulmonary edema. No significant pleural effusion. Trachea and central bronchi are patent. Musculoskeletal: There are no acute or suspicious osseous abnormalities. Generalized paucity of body fat. Review of the MIP images confirms the above findings. CTA ABDOMEN AND PELVIS FINDINGS VASCULAR Aorta: Normal caliber aorta without aneurysm, dissection, vasculitis or significant stenosis. Moderate calcified and noncalcified atheromatous plaque. Celiac: Patent without evidence of aneurysm, dissection, vasculitis or significant stenosis. SMA: Patent without evidence of aneurysm, dissection, vasculitis or significant stenosis. Replaced right hepatic artery arises from the SMA. Renals: Early branching of right renal artery. Single left renal artery. Bilateral renal arteries are patent. IMA: Patent. Inflow: Patent without evidence of aneurysm, dissection, vasculitis or significant stenosis. Moderate atherosclerosis. Veins: Circumaortic left renal vein. TIPS in the liver which is not well assessed on this arterial phase exam. Review of the MIP images confirms the above findings. NON-VASCULAR Hepatobiliary: Cirrhosis with nodular hepatic contours. TIPS in place, patency not well assessed on this  arterial phase exam. There is no evidence of arterially enhancing focal lesion. Similar appearance of gallbladder and biliary dilatation from prior exam. No calcified gallstone. No pericholecystic inflammation or gallbladder wall thickening. Pancreas: Parenchymal atrophy.  Insert Spleen: Improved splenomegaly, spleen currently spans 12.8 cm cranial caudal, previously 14.4 cm. No focal abnormality on arterial phase imaging. Adrenals/Urinary Tract: No adrenal nodule. No hydronephrosis or perinephric edema. Urinary bladder is minimally distended. No bladder wall thickening. Stomach/Bowel: Decompressed stomach. No evidence of gastric or duodenal wall thickening. Small duodenal diverticulum arising from the third portion. No small bowel obstruction or inflammatory change. Low lying cecum in the midline pelvis. Appendix not confidently visualized. Moderate volume of colonic stool. There is no colonic wall thickening. No abnormal rectal distention. Lymphatic: No enlarged abdominopelvic lymph nodes. Reproductive: Status post hysterectomy. No adnexal masses. Other: Trace ascites in the left upper quadrant, volume amenable to paracentesis. No free air. Musculoskeletal: There are  no acute or suspicious osseous abnormalities. Review of the MIP images confirms the above findings. IMPRESSION: 1. Thoracoabdominal aortic atherosclerosis without dissection or acute aortic abnormality. No central pulmonary embolus. 2. Consolidation in the dependent left lower lobe with associated elevated hemidiaphragm, atelectasis versus pneumonia. 3. Cirrhosis with nodular hepatic contours. TIPS in place, patency not well assessed on this arterial phase exam. Trace ascites in the left upper quadrant. 4. Similar appearance of gallbladder and biliary dilatation from prior exam. Aortic Atherosclerosis (ICD10-I70.0) and Emphysema (ICD10-J43.9). Electronically Signed   By: Keith Rake M.D.   On: 05/30/2020 22:23   US ABDOMEN LIMITED RUQ  Result  Date: 05/30/2020 CLINICAL DATA:  Epigastric pain EXAM: ULTRASOUND ABDOMEN LIMITED RIGHT UPPER QUADRANT COMPARISON:  CT 05/30/2020 FINDINGS: Gallbladder: Distended gallbladder without shadowing stone. Normal wall thickness. Negative sonographic Murphy. Common bile duct: Diameter: Slightly enlarged at 7.4 mm Liver: Cirrhotic morphology of the liver. Calcification in the right hepatic lobe. Portal vein is patent on color Doppler imaging with normal direction of blood flow towards the liver. Patient has history of tips. The tips shunt was not specifically evaluated. Other: None. IMPRESSION: 1. Distended gallbladder without shadowing stone or other suspicious sonographic features. 2. Slightly enlarged common bile duct. Electronically Signed   By: Donavan Foil M.D.   On: 05/30/2020 23:27    STUDIES: 01/19/2019 IMPRESSIONS    1. The left ventricle has normal systolic function, with an ejection  fraction of 55-60%. The cavity size was normal. Left ventricular diastolic  function could not be evaluated. No evidence of left ventricular regional  wall motion abnormalities.  2. The right ventricle has normal systolic function. The cavity was not  assessed. There is no increase in right ventricular wall thickness. Right  ventricular systolic pressure is mildly elevated with an estimated  pressure of 35.0 mmHg.  3. Left atrial size was mildly dilated.  4. The mitral valve is degenerative. Mild thickening of the mitral valve  leaflet. There is moderate mitral annular calcification present. No  evidence of mitral valve stenosis.  5. The tricuspid valve is grossly normal. Tricuspid valve regurgitation  is mild-moderate.  6. The aortic valve is tricuspid. Moderate thickening of the aortic  valve. Mild calcification of the aortic valve.  7. The aortic root is normal in size and structure.  8. The inferior vena cava was dilated in size with <50% respiratory  variability.  9. Underlying rhythm appears  to be atrial flutter.  CULTURES: Blood cultures x 2>>  ANTIBIOTICS: Vancomycin 10/01> Metronidazole 10/1> Cefepime 10/1>  LINES/TUBES: PIVs  DISCUSSION: 67 presenting with septic shock from pneumonia and possible intra-abdominal infection  ASSESSMENT / PLAN:  CARDIOVASCULAR A:  Septic versus hypovolemic shock versus shock; versus low BP due to medications side effects-Improved H/o HTN A flutter P:  -It appears patient's low BP is due to medications that she took while in the ED waiting room. She is on midodrine at home. Currently on 3 mcg of leveophed.  -Continue pressors and IV fluids to maintain map>65 -Hemodynamics per ICU protocol -Hole BP medications -Encouraged patient to stop taking medications without consulting the nurse of ED MD  RENAL A:   Hypokalemia P:   Monitor and correct electrolytes  GASTROINTESTINAL A:   Possible intraabdominal infection Cirrhosis s/p TIPS procedure GERD Esophageal varices Hepatitis Hiatal Hernia S/P repair Portal hypertension Abdominal pain with nausea and vomiting P:  -No episodes of vomiting observed in the ED -Antibiotics as above -IV fluids -f/u Cultures -prn pain medications  HEMATOLOGIC  A:  Blood loss anemia P:  -Trend H/H and transfuse prn  INFECTIOUS A:   LLL Pneumonia P:   -Antibiotics as above and f/u cultures   NEUROLOGIC A:   Acute encephalopathy secondary to medications P:   -Avoid over sedation -Monitor pain symptoms that are not consistent with psychological presentation  Best Practice: Code Status:Full code Diet: Regular GI prophylaxis: PPI VTE prophylaxis: SCDs/No pharmacologic VTE prophylaxis due to risk of bleeding  FAMILY  - Updates:   Tradarius Reinwald S. [facility] ANP-BC Pulmonary and Critical Care Medicine [facility] Pager [phone] or [phone]  NB: This document was prepared using Dragon voice recognition software and may include unintentional dictation  errors.    05/31/2020, 7:09 AM

## 2020-05-31 NOTE — ED Notes (Signed)
Called ICU to give report once again. Was told that they are not taking anymore pts. Will make charge aware again.

## 2020-05-31 NOTE — ED Notes (Addendum)
Pt resting comfortably, awaiting vanc from pharmacy

## 2020-05-31 NOTE — ED Notes (Signed)
RT states is having difficulty obtaining blood for abg, will send an additional RT to attempt.

## 2020-05-31 NOTE — ED Notes (Signed)
Attempted to call report to ICU. Was told that they "haven't approved" pt at this time. Will make charge aware and attempt to call report again.

## 2020-05-31 NOTE — Social Work (Signed)
Patient needs Anita Reed order in order to obtain advanced directive and primary contact/POA information.

## 2020-05-31 NOTE — ED Notes (Signed)
Pt assisted on bedpan.

## 2020-05-31 NOTE — Progress Notes (Signed)
Pharmacy Antibiotic Note  Laressa Bolinger is a 59 y.o. female admitted on 05/30/2020 with pneumonia.  Pharmacy has been consulted for Vancomycin and Cefepime dosing.  Plan: Cefepime 2gm IV q12hrs Vancomycin 500mg  IV q12hrs (per nomogram)  Height: 5\' 9"  (175.3 cm) Weight: 57.6 kg (127 lb) IBW/kg (Calculated) : 66.2  Temp (24hrs), Avg:97.8 F (36.6 C), Min:97.8 F (36.6 C), Max:97.8 F (36.6 C)  Recent Labs  Lab 05/28/20 1459 05/30/20 0800 05/30/20 2123 05/30/20 2125 05/31/20 0005  WBC 11.9* 16.2* 10.7*  --   --   CREATININE 0.59 0.63  --   --  1.04*  LATICACIDVEN  --   --   --  2.6* 3.0*    Estimated Creatinine Clearance: 53.6 mL/min (A) (by C-G formula based on SCr of 1.04 mg/dL (H)).    No Known Allergies  Antimicrobials this admission:   >>    >>   Dose adjustments this admission:   Microbiology results:  BCx:   UCx:    Sputum:    MRSA PCR:   Thank you for allowing pharmacy to be a part of this patient's care.  Hart Robinsons A 05/31/2020 2:14 AM

## 2020-05-31 NOTE — ED Notes (Signed)
ER provider notified of BP of 121/82 with a MAP of 93. Provider notified that norepinephrine is at 71mcg/min. Provider stated to maintain at that rate for now, and if BP continues to increase to stop the norepinephrine.

## 2020-05-31 NOTE — ED Notes (Signed)
Assumed care of pt at 1900, pt made aware of plan of care. Denies needs or concerns at this time. Husband at bedside. Pt provided with ice water per request. Call bell within reach. AO x4. Talking in full sentences with regular and unlabored breathing.

## 2020-05-31 NOTE — ED Notes (Signed)
Pt had large formed BM with enema.

## 2020-05-31 NOTE — Progress Notes (Signed)
Pharmacy Antibiotic Note  Anita Reed is a 59 y.o. female admitted on 05/30/2020 with pneumonia.  Pharmacy has been consulted for Vancomycin and Cefepime dosing.   Today, renal function has improved since admission.   Plan: Increase vancomycin to 750mg  IV q12hrs (per nomogram) Increase Cefepime to 2gm IV q8hrs  Height: 5\' 9"  (175.3 cm) Weight: 57.6 kg (127 lb) IBW/kg (Calculated) : 66.2  No data recorded.  Recent Labs  Lab 05/28/20 1459 05/30/20 0800 05/30/20 2123 05/30/20 2125 05/31/20 0005 05/31/20 0422  WBC 11.9* 16.2* 10.7*  --   --  13.8*  CREATININE 0.59 0.63  --   --  1.04* 0.76  LATICACIDVEN  --   --   --  2.6* 3.0*  --     Estimated Creatinine Clearance: 69.7 mL/min (by C-G formula based on SCr of 0.76 mg/dL).    No Known Allergies  Antimicrobials this admission: vanc 10/2> Cefepime 10/2> Metronidazole 10/1>  Microbiology results: 10/2 BCx:  Ng<12h  MRSA PCR: not collected   Thank you for allowing pharmacy to be a part of this patient's care.  Mills Koller 05/31/2020 11:14 AM

## 2020-05-31 NOTE — ED Notes (Signed)
Pt will not be still in bed. Continues to thrash around and pull at IV lines and cardiac monitors. Educated pt that the medication she is receiving via IV is very important and that she isn't getting it when her pump is beeping because she will not be still. Pt has been educated continuously on this issue since this RN's shift began.

## 2020-05-31 NOTE — Progress Notes (Signed)
PHARMACY CONSULT NOTE - FOLLOW UP  Pharmacy Consult for Electrolyte Monitoring and Replacement   Recent Labs: Potassium (mmol/L)  Date Value  05/31/2020 3.4 (L)   Magnesium (mg/dL)  Date Value  07/30/2019 1.7   Calcium (mg/dL)  Date Value  05/31/2020 8.3 (L)   Albumin (g/dL)  Date Value  05/31/2020 3.1 (L)   Phosphorus (mg/dL)  Date Value  07/17/2019 2.6   Sodium (mmol/L)  Date Value  05/31/2020 137   Assessment: K 3.4  Goal of Therapy:  K >/= 4  Plan:  KCL 28meq po now x 1 F/U with next lab draw  Ena Dawley ,PharmD Clinical Pharmacist 05/31/2020 12:51 AM

## 2020-05-31 NOTE — ED Notes (Signed)
Provided pt with 2 warm blankets.

## 2020-06-01 LAB — BASIC METABOLIC PANEL
Anion gap: 10 (ref 5–15)
BUN: 14 mg/dL (ref 6–20)
CO2: 19 mmol/L — ABNORMAL LOW (ref 22–32)
Calcium: 7.9 mg/dL — ABNORMAL LOW (ref 8.9–10.3)
Chloride: 108 mmol/L (ref 98–111)
Creatinine, Ser: 0.55 mg/dL (ref 0.44–1.00)
GFR calc Af Amer: >60 mL/min (ref >60)
GFR calc non Af Amer: >60 mL/min (ref >60)
Glucose, Bld: 133 mg/dL — ABNORMAL HIGH (ref 70–99)
Potassium: 3.8 mmol/L (ref 3.5–5.1)
Sodium: 137 mmol/L (ref 135–145)

## 2020-06-01 LAB — LEGIONELLA PNEUMOPHILA SEROGP 1 UR AG: L. pneumophila Serogp 1 Ur Ag: NEGATIVE

## 2020-06-01 LAB — MRSA PCR SCREENING: MRSA by PCR: NEGATIVE

## 2020-06-01 MED ORDER — POTASSIUM CHLORIDE 10 MEQ/100ML IV SOLN: 10.0000 meq | INTRAVENOUS | Status: DC

## 2020-06-01 MED ORDER — IBUPROFEN 600 MG PO TABS
600.0000 mg | ORAL_TABLET | Freq: Once | ORAL | Status: AC
  Administered 2020-06-01: 600 mg via ORAL
  Filled 2020-06-01: qty 1

## 2020-06-01 MED ORDER — MIDODRINE HCL 5 MG PO TABS
10.0000 mg | ORAL_TABLET | Freq: Three times a day (TID) | ORAL | Status: DC
  Administered 2020-06-01 – 2020-06-03 (×6): 10 mg via ORAL
  Filled 2020-06-01 (×8): qty 2

## 2020-06-01 MED ORDER — POTASSIUM CHLORIDE CRYS ER 20 MEQ PO TBCR
40.0000 meq | EXTENDED_RELEASE_TABLET | Freq: Once | ORAL | Status: AC
  Administered 2020-06-01: 40 meq via ORAL
  Filled 2020-06-01: qty 2

## 2020-06-01 MED ORDER — MAGNESIUM SULFATE 2 GM/50ML IV SOLN
2.0000 g | Freq: Once | INTRAVENOUS | Status: AC
  Administered 2020-06-01: 2 g via INTRAVENOUS
  Filled 2020-06-01: qty 50

## 2020-06-01 MED ORDER — LEVOFLOXACIN 750 MG PO TABS
750.0000 mg | ORAL_TABLET | Freq: Every day | ORAL | Status: DC
  Administered 2020-06-01 – 2020-06-03 (×3): 750 mg via ORAL
  Filled 2020-06-01 (×3): qty 1

## 2020-06-01 NOTE — ED Notes (Signed)
Pt repositioned in hospital bed at this time. AO x4. Talking in full sentences with regular and unlabored breathing

## 2020-06-01 NOTE — ED Notes (Signed)
Pulse ox probe changed. Now reading 100%.

## 2020-06-01 NOTE — ED Notes (Signed)
Levophed titrated down to 45mcg/min.

## 2020-06-01 NOTE — ED Notes (Signed)
Helped pt to BSC to void. 

## 2020-06-01 NOTE — Progress Notes (Addendum)
PHARMACY CONSULT NOTE - FOLLOW UP  Pharmacy Consult for Electrolyte Monitoring and Replacement   Recent Labs: Potassium (mmol/L)  Date Value  05/31/2020 3.3 (L)   Magnesium (mg/dL)  Date Value  05/31/2020 1.7   Calcium (mg/dL)  Date Value  05/31/2020 8.2 (L)   Albumin (g/dL)  Date Value  05/31/2020 3.1 (L)   Phosphorus (mg/dL)  Date Value  05/31/2020 3.5   Sodium (mmol/L)  Date Value  05/31/2020 138   Assessment:  Mag 1.7  Scr 0.76 on 10/2 10/3 am lab K 3.8 Scr 0.55  Goal of Therapy:  K >/= 4 MAg >/= 2  Plan:  Mag.Sulfate 2 gram IV x 1 KCL 40 meq po x 1 Patient on digoxin. F/U labs today and with am labs   Noralee Space ,PharmD Clinical Pharmacist 06/01/2020 9:28 AM

## 2020-06-01 NOTE — ED Notes (Signed)
Clear liquid diet tray provided to pt.

## 2020-06-01 NOTE — ED Notes (Signed)
Pt sitting at side of bed. Eating ice cream and PB crackers.

## 2020-06-01 NOTE — ED Notes (Signed)
Husband at bedside.  

## 2020-06-01 NOTE — Care Management (Signed)
This is a no charge note  Pick up from PCCM, Dr. Lanney Gins  59 year old lady with past medical history of hepatitis, liver cirrhosis, lymphoma, atrial fibrillation, portal vein hypertension, esophageal varices, GERD, chronic hypotension on midodrine, who presents with possible septic shock with hypotension.  Patient is currently on low dose of 1 mg of Levophed peripherally.  Source of infection was not identified.  Patient is on broad antibiotics.  We need to pick up this pt tomorrow AM.     Ivor Costa, MD  [facility]   If 7PM-7AM, please contact night-coverage [email or web address] 06/01/2020, 10:46 AM

## 2020-06-01 NOTE — Progress Notes (Signed)
PULMONARY / CRITICAL CARE MEDICINE  Name: Anita Reed MRN: [id number] DOB: 02/24/1961    LOS: 1 Admitting Provider:  Arlyss Gandy. Tukov-Yual Reason for Admission:  Septic shock Brief patient description: 59 y/o female with cirrhosis s/p TIPS procedure admitted with septic shock secondary to pneumonia requiring pressors.  HPI:  This is a 59 y/o female with a medical history as indicated below who presented to the ED with c/o abdominal pain, dizziness and nausea and vomiting that started a few weeks ago. She was in the ED the day before but left due to long wait time. Initially she was hemodynamically stable per ED MD. She then became acute hypotensive with a BP of 46/20 requiring pressors and IV fluids. ED workup is significant for K+ level of 3.4, lactic acid 3.0, wbc 16.2 and his CT chest showed consolidation in the LLL lung and trace ascites. She is being admitted to the ICU for further management.  She had TIPS procedure on 08/27/19. No GI bleed since procedure. STOOL for OB negative today.   At time of assessment patient is more alert and her blood pressure has improved to the 419F systolic. She states that low blood pressures are a frequent occurrence for her and that this happens when she takes "all her medications at once". Per ED, patient has 3 bags of medications with her and will not allow ED staff to review what the medications are.  Every time she wakes up she requests pain medication. Patient was talking to me and falling asleep yet she kept insisting she needed pain medication. She denies chest pain, palpitations, nausea and vomiting. Patient is on clear liquids and wants to know if she can eat in the morning.    06/01/20- Patient is relatively unchanged. Plan is to wean off pressor support. Will sign out to [facility] today.   Past Medical History:  Diagnosis Date  . Anemia   . Atrial flutter ([place])   . Cancer [facility])    lymphoma 11 yrs. ago  . Cirrhosis ([place])   . Dysrhythmia   .  Esophageal varices ([place])   . GERD (gastroesophageal reflux disease)   . Hepatitis    hep C-resolved  . History of hiatal hernia   . Hypertension   . Pneumonia   . Portal hypertension (H[facility])    Past Surgical History:  Procedure Laterality Date  . ABDOMINAL HYSTERECTOMY    . APPENDECTOMY    . COLONOSCOPY N/A 06/03/2019   Procedure: COLONOSCOPY;  Surgeon: Toledo, Benay Pike, MD;  Location: [facility] ENDOSCOPY;  Service: Gastroenterology;  Laterality: N/A;  . EPIGASTRIC HERNIA REPAIR N/A 03/21/2020   Procedure: HERNIA REPAIR EPIGASTRIC ADULT, Open;  Surgeon: [name], MD;  Location: [facility] ORS;  Service: General;  Laterality: N/A;  . ESOPHAGOGASTRODUODENOSCOPY N/A 01/29/2019   Procedure: ESOPHAGOGASTRODUODENOSCOPY (EGD);  Surgeon: [name], MD;  Location: [facility] ENDOSCOPY;  Service: Gastroenterology;  Laterality: N/A;  . ESOPHAGOGASTRODUODENOSCOPY N/A 06/01/2019   Procedure: ESOPHAGOGASTRODUODENOSCOPY (EGD);  Surgeon: Toledo, Benay Pike, MD;  Location: [facility] ENDOSCOPY;  Service: Gastroenterology;  Laterality: N/A;  . IR PARACENTESIS  08/27/2019  . IR RADIOLOGIST EVAL & MGMT  08/08/2019  . IR RADIOLOGIST EVAL & MGMT  11/14/2019  . IR TIPS  08/27/2019  . RADIOLOGY WITH ANESTHESIA N/A 08/27/2019   Procedure: IR WITH ANESTHESIA TIPS PROCEDURE;  Surgeon: [name], MD;  Location: [place];  Service: Radiology;  Laterality: N/A;  . TONSILLECTOMY     No current facility-administered medications on file prior to encounter.  Current Outpatient Medications on File Prior to Encounter  Medication Sig  . digoxin (LANOXIN) 0.125 MG tablet Take 1 tablet by mouth once daily  . FLUoxetine (PROZAC) 20 MG tablet Take 1 tablet (20 mg total) by mouth daily.  . furosemide (LASIX) 40 MG tablet Take 0.5 tablets (20 mg total) by mouth daily.  [name] gabapentin (NEURONTIN) 400 MG capsule Take 1 capsule (400 mg total) by mouth 3 (three) times daily.  [name] lactulose (CHRONULAC) 10 GM/15ML solution Take 45 mLs (30 g  total) by mouth 3 (three) times daily.  . metoprolol succinate (TOPROL-XL) 25 MG 24 hr tablet Take 25 mg by mouth daily.  . midodrine (PROAMATINE) 5 MG tablet Take 1 tablet (5 mg total) by mouth 3 (three) times daily with meals.  . pantoprazole (PROTONIX) 20 MG tablet Take 1 tablet (20 mg total) by mouth every morning. One hour before breakfast  . tiZANidine (ZANAFLEX) 4 MG capsule TAKE 1 CAPSULE BY MOUTH THREE TIMES DAILY AS NEEDED FOR MUSCLE SPASM    Allergies No Known Allergies  Family History Family History  Problem Relation Age of Onset  . Cancer Mother   . Cancer Father    Social History  reports that she has been smoking cigarettes. She has a 35.00 pack-year smoking history. She has never used smokeless tobacco. She reports previous alcohol use. She reports that she does not use drugs.  Review Of Systems:   Constitutional: Negative for fever and chills.  HENT: Negative for congestion and rhinorrhea.  Eyes: Negative for redness and visual disturbance.  Respiratory: Negative for shortness of breath and wheezing.  Cardiovascular: Negative for chest pain and palpitations.  Gastrointestinal: Positive for nausea , vomiting, abdominal pain and constipation Genitourinary: Negative for dysuria and urgency.  Endocrine: Denies polyuria, polyphagia and heat intolerance Musculoskeletal: Negative for myalgias and arthralgias.  Skin: Negative for pallor and wound.  Neurological: Negative for dizziness and headaches   VITAL SIGNS: BP (!) 92/59   Pulse 64   Temp 97.8 F (36.6 C) (Oral)   Resp 20   Ht 5\' 9"  (1.753 m)   Wt 57.6 kg   SpO2 97%   BMI 18.75 kg/m   HEMODYNAMICS:    VENTILATOR SETTINGS:    INTAKE / OUTPUT: I/O last 3 completed shifts: In: 8295 [IV Piggyback:1715] Out: 200 [Urine:200]  PHYSICAL EXAMINATION: General: NAD, alert and following commands HEENT: [place]/AT, PERRLA, trachea midline, no JVD Neuro: AAOX3, somnolent and able to respond to  questions Cardiovascular:  AP regular, S1/S2, no MRG, +2 pulses, no edema Lungs: Bilateral breath sounds with rhonchi and wheezing Abdomen: Non-distended, normal bowel sounds Musculoskeletal:  +rom, pain with touch of BL legs which she attributes to neuropathy Skin: warm and dry  LABS:  BMET Recent Labs  Lab 05/30/20 0800 05/31/20 0005 05/31/20 0422  NA 140 137 138  K 3.0* 3.4* 3.3*  CL 101 103 104  CO2 29 24 26   BUN 10 12 13   CREATININE 0.63 1.04* 0.76  GLUCOSE 97 143* 97    Electrolytes Recent Labs  Lab 05/30/20 0800 05/31/20 0005 05/31/20 0422  CALCIUM 9.0 8.3* 8.2*  MG  --   --  1.7  PHOS  --   --  3.5    CBC Recent Labs  Lab 05/30/20 0800 05/30/20 2123 05/31/20 0422  WBC 16.2* 10.7* 13.8*  HGB 16.3* 12.2 13.7  HCT 47.3* 35.8* 38.4  PLT 212 178 202    Coag's Recent Labs  Lab 05/30/20 0800  INR 1.3*  Sepsis Markers Recent Labs  Lab 05/30/20 2125 05/31/20 0005  LATICACIDVEN 2.6* 3.0*    ABG No results for input(s): PHART, PCO2ART, PO2ART in the last 168 hours.  Liver Enzymes Recent Labs  Lab 05/28/20 1459 05/30/20 0800 05/31/20 0005  AST 53* 58* 54*  ALT 28 28 21   ALKPHOS 219* 228* 206*  BILITOT 5.0* 4.6* 5.2*  ALBUMIN 3.5 3.6 3.1*    Cardiac Enzymes No results for input(s): TROPONINI, PROBNP in the last 168 hours.  Glucose Recent Labs  Lab 05/30/20 2118  GLUCAP 140*    Imaging No results found.  STUDIES: 01/19/2019 IMPRESSIONS    1. The left ventricle has normal systolic function, with an ejection  fraction of 55-60%. The cavity size was normal. Left ventricular diastolic  function could not be evaluated. No evidence of left ventricular regional  wall motion abnormalities.  2. The right ventricle has normal systolic function. The cavity was not  assessed. There is no increase in right ventricular wall thickness. Right  ventricular systolic pressure is mildly elevated with an estimated  pressure of 35.0 mmHg.   3. Left atrial size was mildly dilated.  4. The mitral valve is degenerative. Mild thickening of the mitral valve  leaflet. There is moderate mitral annular calcification present. No  evidence of mitral valve stenosis.  5. The tricuspid valve is grossly normal. Tricuspid valve regurgitation  is mild-moderate.  6. The aortic valve is tricuspid. Moderate thickening of the aortic  valve. Mild calcification of the aortic valve.  7. The aortic root is normal in size and structure.  8. The inferior vena cava was dilated in size with <50% respiratory  variability.  9. Underlying rhythm appears to be atrial flutter.  CULTURES: Blood cultures x 2>>  ANTIBIOTICS: Vancomycin 10/01> Metronidazole 10/1> Cefepime 10/1>  LINES/TUBES: PIVs  DISCUSSION: 49 presenting with septic shock from pneumonia and possible intra-abdominal infection  ASSESSMENT / PLAN:  CARDIOVASCULAR A:  Septic versus hypovolemic shock versus shock; versus low BP due to medications side effects-Improved H/o HTN A flutter P:  -It appears patient's low BP is due to medications that she took while in the ED waiting room. She is on midodrine at home. Currently on 3 mcg of leveophed.  -Continue pressors and IV fluids to maintain map>65 -Hemodynamics per ICU protocol -Hole BP medications -Encouraged patient to stop taking medications without consulting the nurse of ED MD  RENAL A:   Hypokalemia P:   Monitor and correct electrolytes  GASTROINTESTINAL A:   Possible intraabdominal infection Cirrhosis s/p TIPS procedure GERD Esophageal varices Hepatitis Hiatal Hernia S/P repair Portal hypertension Abdominal pain with nausea and vomiting P:  -No episodes of vomiting observed in the ED -Antibiotics as above -IV fluids -f/u Cultures -prn pain medications  HEMATOLOGIC A:  Blood loss anemia P:  -Trend H/H and transfuse prn  INFECTIOUS A:   LLL Pneumonia P:   -Antibiotics as above and f/u  cultures   NEUROLOGIC A:   Acute encephalopathy secondary to medications P:   -Avoid over sedation -Monitor pain symptoms that are not consistent with psychological presentation  Best Practice: Code Status:Full code Diet: Regular GI prophylaxis: PPI VTE prophylaxis: SCDs/No pharmacologic VTE prophylaxis due to risk of bleeding  FAMILY  - Updates:   Critical care provider statement:    Critical care time (minutes):  33   Critical care time was exclusive of:  Separately billable procedures and  treating other patients   Critical care was necessary to treat or prevent  imminent or  life-threatening deterioration of the following conditions:  circulatory shock, multiple comorbid conditions   Critical care was time spent personally by me on the following  activities:  Development of treatment plan with patient or surrogate,  discussions with consultants, evaluation of patient's response to  treatment, examination of patient, obtaining history from patient or  surrogate, ordering and performing treatments and interventions, ordering  and review of laboratory studies and re-evaluation of patient's condition   I assumed direction of critical care for this patient from another  provider in my specialty: no     NB: This document was prepared using Dragon voice recognition software and may include unintentional dictation errors.   Ottie Glazier, M.D.  [facility] [place]    06/01/2020, 10:20 AM

## 2020-06-01 NOTE — ED Notes (Signed)
Pt handed over meds from both purses. This nurse inspected both purses and did not see any additional pills.

## 2020-06-01 NOTE — ED Notes (Signed)
Pt sitting in bed sipping water. Pt slightly irritable. Complaining that is forced to be NPO and "take all these pills" that she "doesn't need".

## 2020-06-02 ENCOUNTER — Inpatient Hospital Stay: Payer: Medicare Other

## 2020-06-02 ENCOUNTER — Telehealth: Payer: Self-pay

## 2020-06-02 DIAGNOSIS — E876 Hypokalemia: Secondary | ICD-10-CM

## 2020-06-02 DIAGNOSIS — F419 Anxiety disorder, unspecified: Secondary | ICD-10-CM

## 2020-06-02 DIAGNOSIS — K7031 Alcoholic cirrhosis of liver with ascites: Secondary | ICD-10-CM

## 2020-06-02 LAB — MAGNESIUM: Magnesium: 1.7 mg/dL (ref 1.7–2.4)

## 2020-06-02 LAB — BASIC METABOLIC PANEL
Anion gap: 6 (ref 5–15)
BUN: 9 mg/dL (ref 6–20)
CO2: 24 mmol/L (ref 22–32)
Calcium: 8.3 mg/dL — ABNORMAL LOW (ref 8.9–10.3)
Chloride: 108 mmol/L (ref 98–111)
Creatinine, Ser: 0.47 mg/dL (ref 0.44–1.00)
GFR calc Af Amer: >60 mL/min (ref >60)
GFR calc non Af Amer: >60 mL/min (ref >60)
Glucose, Bld: 108 mg/dL — ABNORMAL HIGH (ref 70–99)
Potassium: 4.3 mmol/L (ref 3.5–5.1)
Sodium: 138 mmol/L (ref 135–145)

## 2020-06-02 LAB — TSH: TSH: 1.624 u[IU]/mL (ref 0.350–4.500)

## 2020-06-02 LAB — CORTISOL: Cortisol, Plasma: 16.1 ug/dL

## 2020-06-02 IMAGING — DX DG CHEST 1V PORT
1 series · 1 of 1 positions shown · non-contrast
Comparison: [DATE] and CT scan from [DATE]

CLINICAL DATA: Abdominal pain, dizziness, nausea and vomiting.

EXAM:
PORTABLE CHEST 1 VIEW

[chest ap]
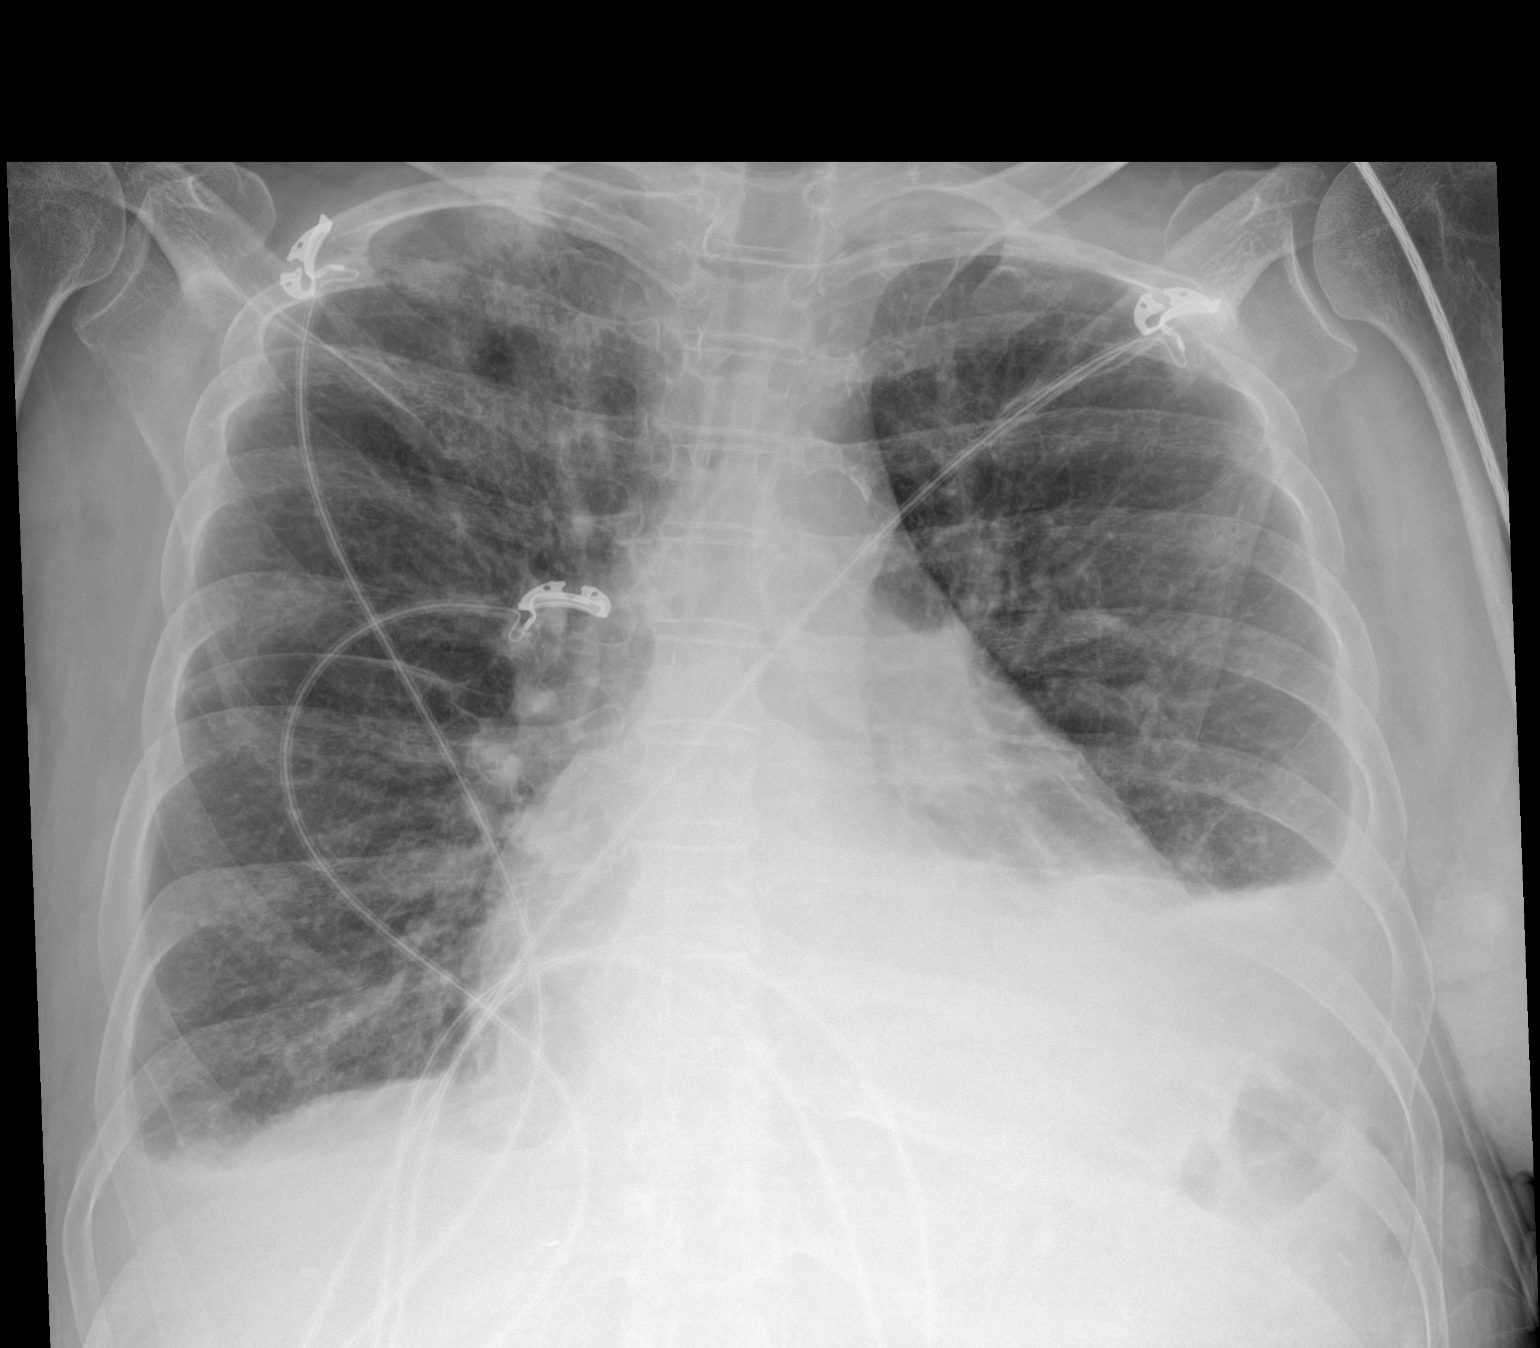

[1 of 1 positions shown; findings below may reference images not displayed]

FINDINGS: Background emphysema noted. Cardiomegaly is present indistinct
pulmonary vasculature compatible with pulmonary venous hypertension.
Blunting of both costophrenic angles compatible with at least small
bilateral pleural effusions. Elevated left hemidiaphragm with
continued consolidation/airspace opacity in the left lower lobe.
Progressive peripheral airspace opacity at the right lung apex could
be from pleural fluid or additional consolidation, in there is faint
airspace opacity in the right mid lung.
IMPRESSION: 1. Cardiomegaly with pulmonary venous hypertension and at least
small bilateral pleural effusions, increased from recent prior
exams.
2. Progressive peripheral airspace opacity at the right lung apex
could be from pleural fluid or additional consolidation.
3. Continued airspace opacity at the left lung base.
4. Elevated left hemidiaphragm.

## 2020-06-02 MED ORDER — GABAPENTIN 100 MG PO CAPS
100.0000 mg | ORAL_CAPSULE | Freq: Once | ORAL | Status: AC
  Administered 2020-06-02: 100 mg via ORAL
  Filled 2020-06-02 (×2): qty 1

## 2020-06-02 NOTE — Telephone Encounter (Signed)
Patient states she is in the ED.  Patient stated in the waiting room she passed out.  She wants Dr. Rockey Situ to come over and see her. She said she needed Dr. Rockey Situ to explain to them she would be better at home.  BP has been up and down.   Patient stated please call - [phone]

## 2020-06-02 NOTE — ED Notes (Signed)
Attempted to draw patient labs, patient is a difficult stick, lab will send someone ASAP

## 2020-06-02 NOTE — Progress Notes (Signed)
[facility] hospital liaison note:  Probation officer visited with patient at request of [facility] Adelene Amas and attending physician Dr. Manuella Ghazi to discuss hospice services at home. Services were outlined. Writer also provided information regarding [employer] Palliative program. At the time of the visit patient declined either program and is still insisting she wants to return home. Patient was agreeable for writer to follow up with patient again tomorrow.  TOC and attending physician updated via Epic chat and telephone. Thank you for the opportunity to be involved in the care of this patient. Flo Shanks BSN, RN, [place] [phone]

## 2020-06-02 NOTE — TOC Initial Note (Signed)
Transition of Care [facility]) - Initial/Assessment Note    Patient Details  Name: Anita Reed MRN: [id number] Date of Birth: 06/13/1961  Transition of Care [facility]) CM/SW Contact:    Iona Beard, [place] Phone Number: 06/02/2020, 4:04 PM  Clinical Narrative:                 Pt admitted for septic shock. TOC received consult for hospice. Pt can complete ADLs. Pt does not drive often though she will take herself to come doctors appointments. Pt has the support of her husband and he is able to pick up pts medications also. Pt does not have a walker or cane. TOC made referral to Santiago Glad with [facility] to speak with pt about hospice referral. Per Santiago Glad, "I spent 30 minutes with her Dr. Manuella Ghazi, she wants to go home, she was not "ready" for hospice, I did talk with her about Palliative....she wasn't even open to that, but I told her I would come back tomorrow." Attending is aware of pt desire to return home and disinterest in hospice or palliative care, hopes pt will be open to [facility] with Palliative care. Pt d/c estimated for tomorrow as per attending. TOC to follow.    Expected Discharge Plan: Home/Self Care Barriers to Discharge: Continued Medical Work up   Patient Goals and CMS Choice Patient states their goals for this hospitalization and ongoing recovery are:: Return home.      Expected Discharge Plan and Services Expected Discharge Plan: Home/Self Care     Post Acute Care Choice: NA Living arrangements for the past 2 months: Single Family Home                 DME Arranged: N/A DME Agency: NA       HH Arranged: NA [place] Agency: NA        Prior Living Arrangements/Services Living arrangements for the past 2 months: Single Family Home Lives with:: Spouse Patient language and need for interpreter reviewed:: Yes Do you feel safe going back to the place where you live?: Yes      Need for Family Participation in Patient Care: No (Comment) Care giver support system in place?: Yes  (comment)   Criminal Activity/Legal Involvement Pertinent to Current Situation/Hospitalization: No - Comment as needed  Activities of Daily Living      Permission Sought/Granted                  Emotional Assessment Appearance:: Appears older than stated age Attitude/Demeanor/Rapport: Engaged Affect (typically observed): Accepting Orientation: : Oriented to Self, Oriented to Place, Oriented to  Time, Oriented to Situation Alcohol / Substance Use: Not Applicable Psych Involvement: No (comment)  Admission diagnosis:  Septic shock ([place]) [A41.9, R65.21] Patient Active Problem List   Diagnosis Date Noted  . Septic shock ([place]) 05/31/2020  . Healthcare-associated pneumonia   . Cirrhosis of liver without ascites ([place])   . Epigastric hernia 03/06/2020  . Major depressive disorder with single episode, in partial remission ([place]) 11/28/2019  . Neuropathy 11/28/2019  . Dysthymia 07/27/2019  . Other chronic pain 07/27/2019  . Muscle spasticity 07/27/2019  . C. difficile colitis   . Hypokalemia 07/15/2019  . Bandemia 07/15/2019  . Hypotension   . Goals of care, counseling/discussion   . Palliative care encounter   . Muscle cramping 06/22/2019  . Alcohol abuse 06/22/2019  . Sepsis ([place]) 06/22/2019  . GIB (gastrointestinal bleeding) 06/01/2019  . GI bleeding 06/01/2019  . History of lymphoma 02/26/2019  . Atrial flutter (  [place]) 02/07/2019  . Alcoholic cirrhosis of liver with ascites ([place]) 02/07/2019  . Persistent atrial fibrillation ([employer]) 02/07/2019  . Anemia 02/07/2019  . Anorexia 02/07/2019  . GI bleed 01/28/2019  . Ascites 01/18/2019  . Cancer [facility])    PCP:  Towanda Malkin, MD Pharmacy:   [facility] [address] [place] [address] [place] [address] Phone: [phone] Fax: [phone]     Social Determinants of Health (SDOH) Interventions    Readmission Risk Interventions Readmission Risk Prevention Plan 07/18/2019  07/15/2019  Transportation Screening Complete Complete  PCP or Specialist Appt within 3-5 Days - Complete  HRI or [place] - Complete  Social Work Consult for [place] Planning/Counseling - Patient refused  Palliative Care Screening - Complete  Medication Review Press photographer) Complete Complete  PCP or Specialist appointment within 3-5 days of discharge (No Data) -  [place] or Home Care Consult Complete -  Palliative Care Screening Complete -  [place] Not Applicable -  Some recent data might be hidden

## 2020-06-02 NOTE — ED Notes (Signed)
Patient was seen by Dr. Manuella Ghazi

## 2020-06-02 NOTE — Telephone Encounter (Signed)
Spoke with patient and she wants to go home. She states that Dr. Rockey Situ knows about her situation and she would rather be at home. Patient states that she has now stabilized and no longer needs monitoring. Reviewed that our providers only come down if they consult with [place]. Reviewed that Dr. Rockey Situ is in the office today and would not be able to come down there. She expressed frustration and would like to go home. She has even considered just leaving. I did review that if she leaves AMA then insurance may not cover her visit. She was then agreeable to stay so they can monitor and assess her. Provided emotional support for her frustration but that she needs to allow them to monitor and hopefully they will let her go soon. She was appreciative for the call, verbalized understanding of our conversation, and had no further questions at this time.

## 2020-06-02 NOTE — Progress Notes (Addendum)
1       PROGRESS NOTE    Anita Reed  HYI:[id number] DOB: 11-08-1960 DOA: 05/30/2020 PCP: Towanda Malkin, MD   Brief Narrative:   This is a 59 y/o female with a medical history as indicated below who presented to the ED with c/o abdominal pain, dizziness and nausea and vomiting that started a few weeks ago. She was in the ED the day before but left due to long wait time. Initially she was hemodynamically stable per ED MD. She then became acute hypotensive with a BP of 46/20 requiring pressors and IV fluids. ED workup is significant for K+ level of 3.4, lactic acid 3.0, wbc 16.2 and his CT chest showed consolidation in the LLL lung and trace ascites. She had TIPS procedure on 08/27/19.  She was admitted under ICU service on 10/2 and transferred to hospitalist on 10/4.  When I saw her she was still requiring Levophed to maintain her blood pressure.  She has been asking for discharge.   Assessment & Plan:   Active Problems:   Septic shock ([place])  59 year old female admitted for septic shock from pneumonia  Septic versus hypovolemic shock She was on Levophed earlier today when I saw her, will try to wean her off to keep MAP above 23 Source of infection likely pneumonia although cannot rule out possible intra-abdominal infection Continue Levaquin for now.  Hypokalemia Repleted and resolved  Cirrhosis s/p TIPS procedure GERD Esophageal paresis Hepatitis Portal hypertension Monitor for now  Acute metabolic encephalopathy Improved and seems at baseline now  Severe protein calorie malnutrition Likely from underlying liver disease/cirrhosis and poor p.o. intake   Body mass index is 18.75 kg/m.    DVT prophylaxis: SCDs Start: 05/31/20 0026     Code Status: Full Code Family Communication: Updated patient's husband [name] at [phone] Disposition Plan: In next 1 to 2 days depending on clinical condition, blood pressure monitoring Status is: Inpatient  Remains  inpatient appropriate because:Unsafe d/c plan   Dispo: The patient is from: Home              Anticipated d/c is to: Home              Anticipated d/c date is: 2 days              Patient currently is not medically stable to d/c.     Antimicrobials: Levaquin started on 10/3   Subjective: Adamant in wanting to go home. She is just frustrated and tired of all her issues. I explained to her about her very low blood pressure and requiring medication to support that. Not safe at this time. She wanted to see Dr. Rockey Situ (in the hope that he will let her go) and I explained to her that I will try to reach out but he may not be able to see her as he is not on-call.  Objective: Vitals:   06/02/20 1230 06/02/20 1245 06/02/20 1300 06/02/20 1315  BP: (!) 85/48 (!) 79/52 (!) 89/53 (!) 83/52  Pulse: 63 62 63 61  Resp: (!) 24 (!) 25 14 (!) 28  Temp:      TempSrc:      SpO2: 98% 99% 94% 94%  Weight:      Height:        Intake/Output Summary (Last 24 hours) at 06/02/2020 1538 Last data filed at 06/01/2020 1648 Gross per 24 hour  Intake 1000 ml  Output --  Net 1000 ml   [employer]  05/30/20 0744  Weight: 57.6 kg    Examination:  General exam: Cachectic, chronically ill looking Respiratory system: Clear to auscultation. Respiratory effort normal. Cardiovascular system: S1 & S2 heard, RRR. No JVD, murmurs, rubs, gallops or clicks. No pedal edema. Gastrointestinal system: Abdomen is nondistended, soft and nontender. No organomegaly or masses felt. Normal bowel sounds heard. Central nervous system: Alert and oriented. No focal neurological deficits. Extremities: Symmetric 5 x 5 power. Skin: No rashes, lesions or ulcers Psychiatry: Anxious    Data Reviewed: I have personally reviewed following labs and imaging studies  CBC: Recent Labs  Lab 05/28/20 1459 05/30/20 0800 05/30/20 2123 05/31/20 0422  WBC 11.9* 16.2* 10.7* 13.8*  NEUTROABS  --   --  6.6  --   HGB 16.4* 16.3*  12.2 13.7  HCT 46.4* 47.3* 35.8* 38.4  MCV 90.6 92.6 97.3 92.5  PLT 218 212 178 923   Basic Metabolic Panel: Recent Labs  Lab 05/28/20 1459 05/30/20 0800 05/31/20 0005 05/31/20 0422 06/01/20 1004  NA 139 140 137 138 137  K 2.9* 3.0* 3.4* 3.3* 3.8  CL 99 101 103 104 108  CO2 28 29 24 26  19*  GLUCOSE 112* 97 143* 97 133*  BUN 12 10 12 13 14   CREATININE 0.59 0.63 1.04* 0.76 0.55  CALCIUM 9.1 9.0 8.3* 8.2* 7.9*  MG  --   --   --  1.7  --   PHOS  --   --   --  3.5  --    GFR: Estimated Creatinine Clearance: 69.7 mL/min (by C-G formula based on SCr of 0.55 mg/dL). Liver Function Tests: Recent Labs  Lab 05/28/20 1459 05/30/20 0800 05/31/20 0005  AST 53* 58* 54*  ALT 28 28 21   ALKPHOS 219* 228* 206*  BILITOT 5.0* 4.6* 5.2*  PROT 6.4* 6.4* 5.8*  ALBUMIN 3.5 3.6 3.1*   Recent Labs  Lab 05/28/20 1459  LIPASE 28   Recent Labs  Lab 05/30/20 2225  AMMONIA 45*   Coagulation Profile: Recent Labs  Lab 05/30/20 0800  INR 1.3*   Cardiac Enzymes: No results for input(s): CKTOTAL, CKMB, CKMBINDEX, TROPONINI in the last 168 hours. BNP (last 3 results) No results for input(s): PROBNP in the last 8760 hours. HbA1C: No results for input(s): HGBA1C in the last 72 hours. CBG: Recent Labs  Lab 05/30/20 2118  GLUCAP 140*   Lipid Profile: No results for input(s): CHOL, HDL, LDLCALC, TRIG, CHOLHDL, LDLDIRECT in the last 72 hours. Thyroid Function Tests: No results for input(s): TSH, T4TOTAL, FREET4, T3FREE, THYROIDAB in the last 72 hours. Anemia Panel: No results for input(s): VITAMINB12, FOLATE, FERRITIN, TIBC, IRON, RETICCTPCT in the last 72 hours. Sepsis Labs: Recent Labs  Lab 05/30/20 2125 05/31/20 0005  LATICACIDVEN 2.6* 3.0*    Recent Results (from the past 240 hour(s))  Blood culture (routine x 2)     Status: None (Preliminary result)   Collection Time: 05/30/20  9:25 PM   Specimen: BLOOD  Result Value Ref Range Status   Specimen Description BLOOD LEFT  ANTECUBITAL  Final   Special Requests   Final    BOTTLES DRAWN AEROBIC AND ANAEROBIC Blood Culture results may not be optimal due to an inadequate volume of blood received in culture bottles   Culture   Final    NO GROWTH 3 DAYS Performed at [facility], [address], [place], [place] [address]    Report Status PENDING  Incomplete  Blood culture (routine x 2)  Status: None (Preliminary result)   Collection Time: 05/30/20  9:25 PM   Specimen: BLOOD  Result Value Ref Range Status   Specimen Description BLOOD BLOOD RIGHT FOREARM  Final   Special Requests   Final    BOTTLES DRAWN AEROBIC AND ANAEROBIC Blood Culture results may not be optimal due to an inadequate volume of blood received in culture bottles   Culture   Final    NO GROWTH 3 DAYS Performed at [facility], [address], [place], [place] [address]    Report Status PENDING  Incomplete  Respiratory Panel by RT PCR (Flu A&B, Covid) - Nasopharyngeal Swab     Status: None   Collection Time: 05/30/20 10:26 PM   Specimen: Nasopharyngeal Swab  Result Value Ref Range Status   SARS Coronavirus 2 by RT PCR NEGATIVE NEGATIVE Final    Comment: (NOTE) SARS-CoV-2 target nucleic acids are NOT DETECTED.  The SARS-CoV-2 RNA is generally detectable in upper respiratoy specimens during the acute phase of infection. The lowest concentration of SARS-CoV-2 viral copies this assay can detect is 131 copies/mL. A negative result does not preclude SARS-Cov-2 infection and should not be used as the sole basis for treatment or other patient management decisions. A negative result may occur with  improper specimen collection/handling, submission of specimen other than nasopharyngeal swab, presence of viral mutation(s) within the areas targeted by this assay, and inadequate number of viral copies (<131 copies/mL). A negative result must be combined with clinical observations, patient history, and epidemiological  information. The expected result is Negative.  Fact Sheet for Patients:  PinkCheek.be  Fact Sheet for Healthcare Providers:  GravelBags.it  This test is no t yet approved or cleared by the [place] FDA and  has been authorized for detection and/or diagnosis of SARS-CoV-2 by FDA under an Emergency Use Authorization (EUA). This EUA will remain  in effect (meaning this test can be used) for the duration of the COVID-19 declaration under Section 564(b)(1) of the Act, 21 U.S.C. section 360bbb-3(b)(1), unless the authorization is terminated or revoked sooner.     Influenza A by PCR NEGATIVE NEGATIVE Final   Influenza B by PCR NEGATIVE NEGATIVE Final    Comment: (NOTE) The Xpert Xpress SARS-CoV-2/FLU/RSV assay is intended as an aid in  the diagnosis of influenza from Nasopharyngeal swab specimens and  should not be used as a sole basis for treatment. Nasal washings and  aspirates are unacceptable for Xpert Xpress SARS-CoV-2/FLU/RSV  testing.  Fact Sheet for Patients: PinkCheek.be  Fact Sheet for Healthcare Providers: GravelBags.it  This test is not yet approved or cleared by the [place] FDA and  has been authorized for detection and/or diagnosis of SARS-CoV-2 by  FDA under an Emergency Use Authorization (EUA). This EUA will remain  in effect (meaning this test can be used) for the duration of the  Covid-19 declaration under Section 564(b)(1) of the Act, 21  U.S.C. section 360bbb-3(b)(1), unless the authorization is  terminated or revoked. Performed at [facility], [address] [place], [place] [address]   Culture, blood (single)     Status: None (Preliminary result)   Collection Time: 05/31/20 12:05 AM   Specimen: BLOOD LEFT WRIST  Result Value Ref Range Status   Specimen Description BLOOD LEFT WRIST  Final   Special Requests   Final     BOTTLES DRAWN AEROBIC ONLY Blood Culture results may not be optimal due to an inadequate volume of blood received in culture bottles  Culture   Final    NO GROWTH 2 DAYS Performed at [facility], [address] [place], [place] [address]    Report Status PENDING  Incomplete  MRSA PCR Screening     Status: None   Collection Time: 06/01/20  9:01 AM   Specimen: Nasal Mucosa; Nasopharyngeal  Result Value Ref Range Status   MRSA by PCR NEGATIVE NEGATIVE Final    Comment:        The GeneXpert MRSA Assay (FDA approved for NASAL specimens only), is one component of a comprehensive MRSA colonization surveillance program. It is not intended to diagnose MRSA infection nor to guide or monitor treatment for MRSA infections. Performed at [facility], [address], [place], [place] [address]          Radiology Studies: DG Chest Le[place] 1 View  Result Date: 06/02/2020 CLINICAL DATA:  Abdominal pain, dizziness, nausea and vomiting. EXAM: PORTABLE CHEST 1 VIEW COMPARISON:  05/28/2020 and CT scan from 05/30/2020 FINDINGS: Background emphysema noted. Cardiomegaly is present indistinct pulmonary vasculature compatible with pulmonary venous hypertension. Blunting of both costophrenic angles compatible with at least small bilateral pleural effusions. Elevated left hemidiaphragm with continued consolidation/airspace opacity in the left lower lobe. Progressive peripheral airspace opacity at the right lung apex could be from pleural fluid or additional consolidation, in there is faint airspace opacity in the right mid lung. IMPRESSION: 1. Cardiomegaly with pulmonary venous hypertension and at least small bilateral pleural effusions, increased from recent prior exams. 2. Progressive peripheral airspace opacity at the right lung apex could be from pleural fluid or additional consolidation. 3. Continued airspace opacity at the left lung base. 4. Elevated left hemidiaphragm. Electronically  Signed   By: Van Clines M.D.   On: 06/02/2020 08:17        Scheduled Meds: . digoxin  125 mcg Oral Daily  . FLUoxetine  20 mg Oral Daily  . gabapentin  400 mg Oral TID  . levofloxacin  750 mg Oral Daily  . midodrine  10 mg Oral TID WC  . pantoprazole (PROTONIX) IV  40 mg Intravenous QHS   Continuous Infusions: . sodium chloride 250 mL (06/01/20 1651)     LOS: 2 days    Time spent: 35 minutes    Shakaya Bhullar Manuella Ghazi, MD [facility] Pager [phone]  If 7PM-7AM, please contact night-coverage [email or web address] Password [facility] 06/02/2020, 3:38 PM

## 2020-06-03 ENCOUNTER — Telehealth: Payer: Self-pay | Admitting: Cardiovascular Disease

## 2020-06-03 DIAGNOSIS — K7460 Unspecified cirrhosis of liver: Secondary | ICD-10-CM

## 2020-06-03 DIAGNOSIS — K7030 Alcoholic cirrhosis of liver without ascites: Secondary | ICD-10-CM

## 2020-06-03 DIAGNOSIS — R6521 Severe sepsis with septic shock: Secondary | ICD-10-CM

## 2020-06-03 DIAGNOSIS — A419 Sepsis, unspecified organism: Principal | ICD-10-CM

## 2020-06-03 DIAGNOSIS — Z7189 Other specified counseling: Secondary | ICD-10-CM

## 2020-06-03 DIAGNOSIS — Z66 Do not resuscitate: Secondary | ICD-10-CM

## 2020-06-03 DIAGNOSIS — Z515 Encounter for palliative care: Secondary | ICD-10-CM

## 2020-06-03 DIAGNOSIS — I959 Hypotension, unspecified: Secondary | ICD-10-CM

## 2020-06-03 LAB — COMPREHENSIVE METABOLIC PANEL
ALT: 22 U/L (ref 0–44)
AST: 42 U/L — ABNORMAL HIGH (ref 15–41)
Albumin: 2.8 g/dL — ABNORMAL LOW (ref 3.5–5.0)
Alkaline Phosphatase: 227 U/L — ABNORMAL HIGH (ref 38–126)
Anion gap: 6 (ref 5–15)
BUN: 10 mg/dL (ref 6–20)
CO2: 20 mmol/L — ABNORMAL LOW (ref 22–32)
Calcium: 8.1 mg/dL — ABNORMAL LOW (ref 8.9–10.3)
Chloride: 109 mmol/L (ref 98–111)
Creatinine, Ser: 0.41 mg/dL — ABNORMAL LOW (ref 0.44–1.00)
GFR calc Af Amer: >60 mL/min (ref >60)
GFR calc non Af Amer: >60 mL/min (ref >60)
Glucose, Bld: 110 mg/dL — ABNORMAL HIGH (ref 70–99)
Potassium: 4.1 mmol/L (ref 3.5–5.1)
Sodium: 135 mmol/L (ref 135–145)
Total Bilirubin: 3.4 mg/dL — ABNORMAL HIGH (ref 0.3–1.2)
Total Protein: 5.3 g/dL — ABNORMAL LOW (ref 6.5–8.1)

## 2020-06-03 LAB — CBC
HCT: 38.8 % (ref 36.0–46.0)
Hemoglobin: 13.1 g/dL (ref 12.0–15.0)
MCH: 32.4 pg (ref 26.0–34.0)
MCHC: 33.8 g/dL (ref 30.0–36.0)
MCV: 96 fL (ref 80.0–100.0)
Platelets: 170 10*3/uL (ref 150–400)
RBC: 4.04 MIL/uL (ref 3.87–5.11)
RDW: 16.7 % — ABNORMAL HIGH (ref 11.5–15.5)
WBC: 15.3 10*3/uL — ABNORMAL HIGH (ref 4.0–10.5)
nRBC: 0 % (ref 0.0–0.2)

## 2020-06-03 LAB — MAGNESIUM: Magnesium: 1.5 mg/dL — ABNORMAL LOW (ref 1.7–2.4)

## 2020-06-03 LAB — PHOSPHORUS: Phosphorus: 1.9 mg/dL — ABNORMAL LOW (ref 2.5–4.6)

## 2020-06-03 MED ORDER — MAGNESIUM SULFATE 4 GM/100ML IV SOLN
4.0000 g | INTRAVENOUS | Status: AC
  Administered 2020-06-03: 4 g via INTRAVENOUS
  Filled 2020-06-03: qty 100

## 2020-06-03 MED ORDER — POTASSIUM & SODIUM PHOSPHATES 280-160-250 MG PO PACK
2.0000 | PACK | Freq: Once | ORAL | Status: DC
  Filled 2020-06-03 (×2): qty 2

## 2020-06-03 NOTE — Progress Notes (Signed)
[facility] hospital Liaison note:  New referral for [facility] community Palliative to follow at home received from attending MD Dr. Manuella Ghazi. Thank you.  Flo Shanks BSN, RN, [place] [phone]

## 2020-06-03 NOTE — Consult Note (Signed)
Consultation Note Date: 06/03/2020   Patient Name: Anita Reed  DOB: 03-30-1961  MRN: [id number]  Age / Sex: 59 y.o., female   PCP: Towanda Malkin, MD Referring Physician: No att. providers found   REASON FOR CONSULTATION:Establishing goals of care  Palliative Care consult requested for goals of care discussion in this 59 y.o. female with multiple medical problems including cirrhosis s/p TIPS (08/27/19), esophageal varices, anemia, hx of lymphoma, GERD, hypertension, and history of hepatitis C. She presented to the ED with complaints of dizziness, abdominal pain, nausea and vomiting that she stated started several weeks prior. During her ED course patient became hypotensive (46/20) requiring IV pressors and fluids. She was initially being admitted to ICU pending bed placement. Patient has been agitated and non-compliant while pending bed in ED, refusing care, and insisting on discharging home.   Clinical Assessment and Goals of Care: I have reviewed medical records including lab results, imaging, Epic notes, and MAR, received report from the bedside RN, and assessed the patient. I met at the bedside with Anita Reed to discuss diagnosis prognosis, GOC, EOL wishes, disposition and options.  She is somewhat agitated initially but calms down after speaking and explaining purpose of my visit. She is alert and oriented x3.   I introduced Palliative Medicine as specialized medical care for people living with serious illness. It focuses on providing relief from the symptoms and stress of a serious illness. The goal is to improve quality of life for both the patient and the family. She verbalized understanding expressing previous referral but not following through with appointments.   We discussed a brief life review of the patient, along with her functional and nutritional status. 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 She  states she does not have any children and never wanted any. She and her husband have been married for 25 years and recently celebrated their anniversary 2 weeks ago. She has an [place] dog who she shares pictures of named Crugers.   Anita Reed shares she has encountered a "rough life" all the way back to her teenage years. She shares both parents passed away with cancer and her father had was a severe alcoholic. Her sister committed suicide along with 4 other family members and her brother is in prison. She reports she has no friends by choice and that her health "is horrible and getting worst!" Therapeutic listening and support provided.   We discussed Her current illness and what it means in the larger context of Her on-going co-morbidities. With specific discussions regarding her hypotension.  Natural disease trajectory and expectations at EOL were discussed.  Anita Reed understanding of her current illness. She reports she has medication at home and prefers to follow up with Dr. Rockey Situ at discharge. She mentions having an e-visit earlier this morning with an appointment arranged for 06/18/20. She shares she has had similar episodes of hypotension in the past only to spend 17 days in the hospital and discharge still with a low blood pressure. She asks me to recheck her pressure which reads 96/64. She reports this is a good reading for her and she is adamant she is discharging.   I discussed at length my concerns of her hypotension despite medications and the high risk of decompensation, sudden death, fainting which could cause further injury. She verbalized understanding again expressing her wishes to discharge home and follow up outpatient.   I  attempted to elicit values and goals of care important to the patient.    Advanced directives, concepts specific to code status, artifical feeding and hydration, and rehospitalization were considered and discussed. Patient does not  have a documented advanced directive however she has the packet at the bedside. She reports she did not feel like completing with Chaplain earlier and will follow up with someone after discharge but appreciative of the paperwork.   I created a space to discuss her wishes further. She reports she does not have anyone to assign as her medical decision maker. She called her husband who states he is not willing to be her HCPOA and to not put his name on the document (which is why she refused to complete). She states she has no other contacts or friends. I shared with her as I assisted with navigating the form, that she may place on her document in the event of needed decisions that she is leaving the medical team to make final decisions on her behalf although this is not optimal it allows the medical team the ability to know what steps to take in the event she is unable to speak for herself. Patient verbalized understanding with plans to identify just that. She states she does not want artificial feeding, no IVF to prolong life, and absolutely no dialysis.   I discussed at length her current full code status with consideration to her current illness and co-morbidities. Anita Reed reports she would not like any heroic measures and would like to be allowed to pass naturally without machines. I educated patient based on her expressed wishes recommendations for DNR/DNI should be considered. She verbalized understanding and expressed she has already acknowledged her wishes for DNR with her PCP/Cardiology and have the golden form in her home already. Anita Reed again confirms wishes for DNR/DNI and agreement for this to be noted in her chart.   Hospice and Palliative Care services outpatient were explained and offered. Patient and family verbalized their understanding and awareness of both palliative and hospice's goals and philosophy of care. Patient reports she is not interested in any home health assistance as she  have tried this in the past and does not like strangers coming into her home several times a week "spying on her". Therapeutic listening and support provided. I explained at length the differences and the timing of outpatient palliative's visits. She verbalized understanding and expressed given they do not come weekly she would be open to their support and would like referral placed.   Questions and concerns were addressed.  Hard Choices booklet left for review. The family was encouraged to call with questions or concerns.  PMT will continue to support holistically.   SOCIAL HISTORY:     reports that she has been smoking cigarettes. She has a 35.00 pack-year smoking history. She has never used smokeless tobacco. She reports previous alcohol use. She reports that she does not use drugs.  CODE STATUS: DNR  ADVANCE DIRECTIVES: Primary Decision Maker: Patient    SYMPTOM MANAGEMENT:per attending   Palliative Prophylaxis:   Frequent Pain Assessment and blood pressure monitoring   PSYCHO-SOCIAL/SPIRITUAL:  Support System: Husband   Desire for further Chaplaincy support:NO   Additional Recommendations (Limitations, Scope, Preferences):  Full Scope Treatment, No Artificial Feeding and No Hemodialysis  Education on hospice/palliative    PAST MEDICAL HISTORY: Past Medical History:  Diagnosis Date  . Anemia   . Atrial flutter (HCC)   . Cancer [facility])    lymphoma 11  yrs. ago  . Cirrhosis ([place])   . Dysrhythmia   . Esophageal varices ([place])   . GERD (gastroesophageal reflux disease)   . Hepatitis    hep C-resolved  . History of hiatal hernia   . Hypertension   . Pneumonia   . Portal hypertension (HCC)     ALLERGIES:  has No Known Allergies.   MEDICATIONS:  Current Facility-Administered Medications  Medication Dose Route Frequency Provider Last Rate Last Admin  . 0.9 %  sodium chloride infusion  250 mL Intravenous Continuous Carrie Mew, MD 20 mL/hr at 06/01/20 1651 250 mL  at 06/01/20 1651  . digoxin (LANOXIN) tablet 125 mcg  125 mcg Oral Daily Tukov-Yual, Magdalene S, NP   125 mcg at 06/03/20 0852  . docusate sodium (COLACE) capsule 100 mg  100 mg Oral BID PRN Tukov-Yual, Magdalene S, NP   100 mg at 06/02/20 1002  . FLUoxetine (PROZAC) capsule 20 mg  20 mg Oral Daily Tukov-Yual, Magdalene S, NP   20 mg at 06/03/20 0852  . gabapentin (NEURONTIN) capsule 400 mg  400 mg Oral TID Tukov-Yual, Magdalene S, NP   400 mg at 06/03/20 0852  . levofloxacin (LEVAQUIN) tablet 750 mg  750 mg Oral Daily Ottie Glazier, MD   750 mg at 06/03/20 0852  . midodrine (PROAMATINE) tablet 10 mg  10 mg Oral TID WC Ottie Glazier, MD   10 mg at 06/03/20 0852  . ondansetron (ZOFRAN) injection 4 mg  4 mg Intravenous Q6H PRN Tukov-Yual, Magdalene S, NP      . pantoprazole (PROTONIX) injection 40 mg  40 mg Intravenous QHS Tukov-Yual, Magdalene S, NP   40 mg at 06/02/20 2213  . polyethylene glycol (MIRALAX / GLYCOLAX) packet 17 g  17 g Oral Daily PRN Tukov-Yual, Magdalene S, NP      . potassium & sodium phosphates (PHOS-NAK) 280-160-250 MG packet 2 packet  2 packet Oral Once Shanlever, Pierce Crane, RPH      . tiZANidine (ZANAFLEX) tablet 4 mg  4 mg Oral TID PRN Erlene Quan, NP   4 mg at 06/03/20 6269   Current Outpatient Medications  Medication Sig Dispense Refill  . digoxin (LANOXIN) 0.125 MG tablet Take 1 tablet by mouth once daily 90 tablet 0  . FLUoxetine (PROZAC) 20 MG tablet Take 1 tablet (20 mg total) by mouth daily. 30 tablet 3  . furosemide (LASIX) 40 MG tablet Take 0.5 tablets (20 mg total) by mouth daily. 15 tablet 3  . gabapentin (NEURONTIN) 400 MG capsule Take 1 capsule (400 mg total) by mouth 3 (three) times daily. 270 capsule 0  . lactulose (CHRONULAC) 10 GM/15ML solution Take 45 mLs (30 g total) by mouth 3 (three) times daily. 236 mL 2  . midodrine (PROAMATINE) 5 MG tablet Take 1 tablet (5 mg total) by mouth 3 (three) times daily with meals. 270 tablet 0  .  pantoprazole (PROTONIX) 20 MG tablet Take 1 tablet (20 mg total) by mouth every morning. One hour before breakfast 30 tablet 1  . tiZANidine (ZANAFLEX) 4 MG capsule TAKE 1 CAPSULE BY MOUTH THREE TIMES DAILY AS NEEDED FOR MUSCLE SPASM 270 capsule 1    VITAL SIGNS: BP 92/64   Pulse 72   Temp 97.8 F (36.6 C) (Oral)   Resp 18   Ht $R'5\' 9"'cL$  (1.753 m)   Wt 57.6 kg   SpO2 98%   BMI 18.75 kg/m  Filed Weights   05/30/20 0744  Weight: 57.6 kg  Estimated body mass index is 18.75 kg/m as calculated from the following:   Height as of this encounter: $RemoveBeforeD'5\' 9"'aVUofmbhQMvwhF$  (1.753 m).   Weight as of this encounter: 57.6 kg.  LABS: CBC:    Component Value Date/Time   WBC 15.3 (H) 06/03/2020 0255   HGB 13.1 06/03/2020 0255   HCT 38.8 06/03/2020 0255   PLT 170 06/03/2020 0255   Comprehensive Metabolic Panel:    Component Value Date/Time   NA 135 06/03/2020 0255   K 4.1 06/03/2020 0255   BUN 10 06/03/2020 0255   CREATININE 0.41 (L) 06/03/2020 0255   CREATININE 0.60 02/26/2020 1204   ALBUMIN 2.8 (L) 06/03/2020 0255     Review of Systems  Neurological: Positive for weakness.  Unless otherwise noted, a complete review of systems is negative.  Physical Exam General: NAD, chronically-ill appearing Cardiovascular: regular rate and rhythm, hypotensive Pulmonary: clear ant fields Abdomen: soft, nontender, + bowel sounds Extremities: no edema, no joint deformities Skin: no rashes, warm and dry Neurological: alert and oriented x3, will follow commands, initially flat/agitated but eventually smiles and opens up during visit   Prognosis: Guarded-Poor    Discharge Planning:  Patient is requesting to discharge home with outpatient palliative support.   Recommendations: . DNR/DNI-as requested/confirmed by patient. Advanced directive reviewed with Anita Reed but not completed at her request. She reports she would want the medical team to make final decisions in the event she is unable to speak for  herself, no peg, no HD.  [name] Continue with current plan of care per medical team  . Patient has been educated and counseled at length on the risk of discharging with low blood pressure. She verbalizes understanding and aware that sudden death could occur. She is adamant that she has been in similar situation and prefers to be at home with her husband and dog and follow up with her outpatient providers.  . Agreeable to outpatient palliative support which will be vital in continuing ongoing discussions and support.  [name] PMT will continue to support and follow. Please call team line with urgent needs.   Palliative Performance Scale: PPS 30%              Patient expressed understanding and was in agreement with this plan.   Thank you for allowing the Palliative Medicine Team to assist in the care of this patient.  Time In: 1205 Time Out: 1255 Time Total: 50 min.   Visit consisted of counseling and education dealing with the complex and emotionally intense issues of symptom management and palliative care in the setting of serious and potentially life-threatening illness.Greater than 50%  of this time was spent counseling and coordinating care related to the above assessment and plan.  Signed by:  Alda Lea, AGPCNP-BC Palliative Medicine Team  Phone: [phone] Pager: [phone] Amion: Bjorn Pippin

## 2020-06-03 NOTE — Discharge Instructions (Signed)
Alcoholic Liver Disease  Alcoholic liver disease is liver damage that is caused by drinking a lot of alcohol for a long time. If you have this disease, you must stop drinking alcohol. Follow these instructions at home:   Do not drink alcohol. Follow your treatment plan. Work with your doctor if you need help.  Think about joining an alcohol support group.  Take over-the-counter and prescription medicines only as told by your doctor. These include vitamins.  Do not use medicines or eat foods that have alcohol in them, unless your doctor says that it is safe.  Follow instructions from your doctor about eating a healthy diet.  Keep all follow-up visits as told by your doctor. This is important. Contact a doctor if:  You get a fever.  Your skin starts to look more yellow, pale, or dark.  You get headaches. Get help right away if:  You throw up (vomit) blood.  You have bright red blood in your poop (stool).  Your poop looks black or looks like tar.  You have trouble: ? Thinking. ? Walking. ? Balancing. ? Breathing. Summary  Alcoholic liver disease is liver damage that is caused by drinking a lot of alcohol for a long time.  If you have this disease, you must stop drinking alcohol. Follow your treatment plan, and work with your doctor as needed.  Think about joining an alcohol support group. This information is not intended to replace advice given to you by your health care provider. Make sure you discuss any questions you have with your health care provider. Document Revised: 12/05/2018 Document Reviewed: 05/06/2017 Elsevier Patient Education  2020 Elsevier Inc.  

## 2020-06-03 NOTE — TOC Transition Note (Addendum)
Transition of Care [facility]) - CM/SW Discharge Note   Patient Details  Name: Anita Reed MRN: [id number] Date of Birth: Dec 02, 1960  [facility] of [facility]) CM/SW Contact:  Ova Freshwater Phone Number:  [phone] 06/03/2020, 10:32 AM   Clinical Narrative:     CSW spoke with patient about d/c options.  Patient adamantly stated she does not want home health or palliative to follow.  Patient stated she will contact her husband when she is ready to leave, "he's only five minutes away."  EDP/ ED Staff notified. TOC consult complete.    Final next level of care: Home/Self Care Barriers to Discharge: Continued Medical Work up   Patient Goals and CMS Choice Patient states their goals for this hospitalization and ongoing recovery are:: Return home.      Discharge Placement                       Discharge Plan and Services     Post Acute Care Choice: NA          DME Arranged: N/A DME Agency: NA       HH Arranged: NA HH Agency: NA        Social Determinants of Health (SDOH) Interventions     Readmission Risk Interventions Readmission Risk Prevention Plan 07/18/2019 07/15/2019  Transportation Screening Complete Complete  PCP or Specialist Appt within 3-5 Days - Complete  HRI or [place] - Complete  Social Work Consult for [place] Planning/Counseling - Patient refused  Palliative Care Screening - Complete  Medication Review Press photographer) Complete Complete  PCP or Specialist appointment within 3-5 days of discharge (No Data) -  [place] or Home Care Consult Complete -  Palliative Care Screening Complete -  [place] Not Applicable -  Some recent data might be hidden

## 2020-06-03 NOTE — Progress Notes (Addendum)
Patient is very adamant in leaving.  She also refused all home health services, palliative care or hospice at discharge.  Her blood pressure remains low and I told her that I do not think it is a safe idea to go home like this as she could die from this.  She is not willing to listen.  She continues to get an argument with me, her husband and refuses to listen.  She says she will see Dr. Rockey Situ and take it from there.  I do not feel comfortable discharging her this way with blood pressure still being low.  She also was not happy about leaving AMA as she is afraid of hospital bill which is understandable.  I have asked palliative care to see her while here. She complained that she does not get a hospital bed after waiting this long.  She complained to her husband that she is not getting toilet paper or shower and she can be this way.  If she leaves, she will have to leave AMA and sign AMA papers.

## 2020-06-03 NOTE — ED Notes (Signed)
Pt noted to by hypotensive on discharge vitals. Pt adamant about leaving, states "My doctor said I could go anyway." MD paged.

## 2020-06-03 NOTE — Progress Notes (Signed)
Patient continues to be adamant in leaving.  Palliative care was able to talk with her.  Patient has agreed for DNR and getting palliative care services at home.  I will discharge her at this time with her clear understanding that she might likely get worse with blood pressure dropping more and possibly could die.

## 2020-06-03 NOTE — Progress Notes (Signed)
[place] visited pt. per OR for AD education; pt. sitting on edge of bed when [facility] arrived --> says she does not want to speak w/[facility] b/c she is about to leave; [place] explained he had info re: advanced directives if pt. wanted; pt. took AD packet --> [facility] explained document can be notarized at bank if needed.  No further needs at this time.

## 2020-06-03 NOTE — Telephone Encounter (Signed)
°  Patient Consent for Virtual Visit         Anita Reed has provided verbal consent on 06/03/2020 for a virtual visit (video or telephone).   CONSENT FOR VIRTUAL VISIT FOR:  Anita Reed  By participating in this virtual visit I agree to the following:  I hereby voluntarily request, consent and authorize [facility] and its employed or contracted physicians, physician assistants, nurse practitioners or other licensed health care professionals (the Practitioner), to provide me with telemedicine health care services (the Services") as deemed necessary by the treating Practitioner. I acknowledge and consent to receive the Services by the Practitioner via telemedicine. I understand that the telemedicine visit will involve communicating with the Practitioner through live audiovisual communication technology and the disclosure of certain medical information by electronic transmission. I acknowledge that I have been given the opportunity to request an in-person assessment or other available alternative prior to the telemedicine visit and am voluntarily participating in the telemedicine visit.  I understand that I have the right to withhold or withdraw my consent to the use of telemedicine in the course of my care at any time, without affecting my right to future care or treatment, and that the Practitioner or I may terminate the telemedicine visit at any time. I understand that I have the right to inspect all information obtained and/or recorded in the course of the telemedicine visit and may receive copies of available information for a reasonable fee.  I understand that some of the potential risks of receiving the Services via telemedicine include:   Delay or interruption in medical evaluation due to technological equipment failure or disruption;  Information transmitted may not be sufficient (e.g. poor resolution of images) to allow for appropriate medical decision making by the Practitioner;  and/or   In rare instances, security protocols could fail, causing a breach of personal health information.  Furthermore, I acknowledge that it is my responsibility to provide information about my medical history, conditions and care that is complete and accurate to the best of my ability. I acknowledge that Practitioner's advice, recommendations, and/or decision may be based on factors not within their control, such as incomplete or inaccurate data provided by me or distortions of diagnostic images or specimens that may result from electronic transmissions. I understand that the practice of medicine is not an exact science and that Practitioner makes no warranties or guarantees regarding treatment outcomes. I acknowledge that a copy of this consent can be made available to me via my patient portal ([facility]), or I can request a printed copy by calling the office of [facility].    I understand that my insurance will be billed for this visit.   I have read or had this consent read to me.  I understand the contents of this consent, which adequately explains the benefits and risks of the Services being provided via telemedicine.   I have been provided ample opportunity to ask questions regarding this consent and the Services and have had my questions answered to my satisfaction.  I give my informed consent for the services to be provided through the use of telemedicine in my medical care

## 2020-06-03 NOTE — Consult Note (Addendum)
PHARMACY CONSULT NOTE - FOLLOW UP  Pharmacy Consult for Electrolyte Monitoring and Replacement   Recent Labs: Potassium (mmol/L)  Date Value  06/03/2020 4.1   Magnesium (mg/dL)  Date Value  06/02/2020 1.7   Calcium (mg/dL)  Date Value  06/03/2020 8.1 (L)   Albumin (g/dL)  Date Value  06/03/2020 2.8 (L)   Phosphorus (mg/dL)  Date Value  05/31/2020 3.5   Sodium (mmol/L)  Date Value  06/03/2020 135   Corrected Ca 9.06  Assessment: 59 yo female with abdominal pain, dizziness and nausea and vomiting.  Pharmacy has been consulted to monitor/replenish electrolytes.  Pt is a refeed risk due to severe malnutrition.   Goal of Therapy:  Electrolytes wnl's  Plan:  No replenishment warranted at this time - will check Mg and Phos today and will recheck BMP, Phos, and Mg with am labs.  Addendum - Mg returned 1.5 and Phos returned 1.9  Pt to receive Mg Sulfate 4g IV bolus on 2 packets of [place] ,PharmD Clinical Pharmacist 06/03/2020 7:12 AM

## 2020-06-04 ENCOUNTER — Telehealth: Payer: Self-pay | Admitting: Cardiovascular Disease

## 2020-06-04 LAB — CULTURE, BLOOD (ROUTINE X 2)
Culture: NO GROWTH
Culture: NO GROWTH

## 2020-06-04 NOTE — Telephone Encounter (Signed)
To Dr. Gollan to review.  

## 2020-06-04 NOTE — Telephone Encounter (Signed)
Patients husband walked into clinic today to request a letter regarding Dr. Rockey Situ writing him a letter to be excused from jury duty due to be the primary care taker of his wife. Patients husband states that he discussed this with Dr. Rockey Situ and he stated that he was able to do this for him.  The letter needs to include: medical reason for dismissal of jury duty (primary care taker) and that this is a long term need (indefinielty), needs to be on letterhead  With a signature of provider.   Asli Tokarski Juror # [id number] Participant: [phone]  Fax letter to [place] section //w/ Clabe Seal, participant [phone] At [phone]  Please also call patients husband to make him aware this was done. Patient needs this done ASAP as he is supposed to call in Sunday for jury duty ruling

## 2020-06-05 ENCOUNTER — Telehealth: Payer: Self-pay | Admitting: Internal Medicine

## 2020-06-05 LAB — CULTURE, BLOOD (SINGLE): Culture: NO GROWTH

## 2020-06-05 NOTE — Telephone Encounter (Signed)
Please let them know that yes I am okay with that. Thanks

## 2020-06-05 NOTE — Telephone Encounter (Signed)
Denise with [facility] calling to advise the pt was discharged from [facility] with palliative care.  She is calling to see if Dr Roxan Hockey ok with that?  [phone] option 2

## 2020-06-06 NOTE — Telephone Encounter (Signed)
Anita Reed was called. She is aware.

## 2020-06-08 NOTE — Telephone Encounter (Signed)
I do not remember the conversation to exclude him from jury duty With that said can we find out if he was called for jury duty on Sunday Monday I only read the note over the weekend  We were not part of her hospital admission all through October 2021, I am not aware of what is going on with her Did not seem cardiac as they did not call us May be more appropriate if he talks with his primary care or patient's primary care to get excused if she is having underlying medical issues that he needs to take care of

## 2020-06-09 NOTE — Telephone Encounter (Signed)
Left voicemail message to call back  

## 2020-06-09 NOTE — Telephone Encounter (Signed)
Spoke with patient and reviewed that cardiology was not consulted on her stay. Advised that her primary care provider or her husbands primary provider would need to assist with any type of jury letter. She verbalized understanding with no further questions at this time.

## 2020-06-10 ENCOUNTER — Telehealth: Payer: Self-pay

## 2020-06-10 NOTE — Telephone Encounter (Signed)
Palliative care SW outreached patient at mobile [phone], call went straight to VM. SW outreached patients husband at [phone], husband advised that SW call home phone and leave a message on answering machine for patient to call back. Husband also shared that his current cell number will be deactivated next week and provided SW with his new cell # [phone].  SW called patients home phone and LVM to scheduled initial palliative care visit. Awaiting return call.

## 2020-06-12 ENCOUNTER — Telehealth: Payer: Self-pay

## 2020-06-12 NOTE — Telephone Encounter (Signed)
Palliative care SW outreached patient x2 today on home and cell phone in an attempt to schedule initial in home visit. SW LVM. Awaiting return call.

## 2020-06-13 ENCOUNTER — Telehealth: Payer: Self-pay

## 2020-06-13 NOTE — Telephone Encounter (Signed)
Palliative care SW outreached patient to scheduled initial visit. HIPPA compliant VM left. Awaiting return call.

## 2020-06-16 ENCOUNTER — Telehealth: Payer: Self-pay

## 2020-06-16 NOTE — Telephone Encounter (Signed)
2:50pm. Palliative care SW returned patients TC - in an attempt to schedule initial palliative care visit. SW left HIPPA compliant VM. Awaiting return call.

## 2020-06-17 NOTE — Progress Notes (Signed)
Date:  06/18/2020   ID:  Anita Reed, DOB October 01, 1960, MRN [id number]  Patient Location:  [address] [place] [address]   Provider location:   [facility], [place] office  PCP:  Towanda Malkin, MD  Cardiologist:  Arvid Right [facility]   Chief Complaint  Patient presents with  . Follow-up    Tachycardia  Pt is noticebaly SOB and swollen---c/o SOB and swelling, a little lightheadedness     History of Present Illness:    Anita Reed is a 59 y.o. female  past medical history of  alcohol abuse, cirrhosis, portal hypertension, varices,  medication noncompliance, hepatitis C ,  atrial flutter on EKG May 2020   ascites severe portal hypertensive gastropathy, GI bleed She presents for f/u of her atrial flutter, fibrillation  In the Er 3 weeks ago with hypotension, requiring antibiotics, pressors Admitted under ICU then transferred to hospitalist service 2 days later She required pressors/Levophed, IV fluids, ABX Told that she had septic shock, pneumonia Blood cx negative  In follow-up today reports that she has some leg swelling but otherwise feeling better Takes lasix 40 daily  Denies any recent tachycardia palpitations concerning for arrhythmia  Smoker Continues to have chronic shortness of breath  Prior hospitalization records reviewed from December 2020 for ascites, alcohol cirrhosis, portal venous hypertension Had therapeutic paracentesis, TIPS performed, ammonia level 102 Was hypotensive at the time systolic pressure 84  EKG personally reviewed by myself on todays visit Shows normal sinus rhythm rate 58 bpm low voltage nonspecific T wave abnormality  The past medical history reviewed Hx of  upper GI bleed atrial fibrillation atrial flutter Previous EKGs reviewed showing atrial flutter rate 120s  She received 2 units packed red blood cells in the hospital for anemia In the setting of GI bleed, hemoglobin dropping to  8.6  EKG shows atrial flutter Jan 18, 2019, EKG on recent hospital admission showing atrial fibrillation, this broke back to flutter  Long history of alcohol, occasional beer or wine   Prior CV studies:   The following studies were reviewed today:    Past Medical History:  Diagnosis Date  . Anemia   . Atrial flutter ([place])   . Cancer [facility])    lymphoma 11 yrs. ago  . Cirrhosis ([place])   . Dysrhythmia   . Esophageal varices ([place])   . GERD (gastroesophageal reflux disease)   . Hepatitis    hep C-resolved  . History of hiatal hernia   . Hypertension   . Pneumonia   . Portal hypertension (HCC)    Past Surgical History:  Procedure Laterality Date  . ABDOMINAL HYSTERECTOMY    . APPENDECTOMY    . COLONOSCOPY N/A 06/03/2019   Procedure: COLONOSCOPY;  Surgeon: Toledo, Benay Pike, MD;  Location: [facility] ENDOSCOPY;  Service: Gastroenterology;  Laterality: N/A;  . EPIGASTRIC HERNIA REPAIR N/A 03/21/2020   Procedure: HERNIA REPAIR EPIGASTRIC ADULT, Open;  Surgeon: [name], MD;  Location: [facility] ORS;  Service: General;  Laterality: N/A;  . ESOPHAGOGASTRODUODENOSCOPY N/A 01/29/2019   Procedure: ESOPHAGOGASTRODUODENOSCOPY (EGD);  Surgeon: [name], MD;  Location: [facility] ENDOSCOPY;  Service: Gastroenterology;  Laterality: N/A;  . ESOPHAGOGASTRODUODENOSCOPY N/A 06/01/2019   Procedure: ESOPHAGOGASTRODUODENOSCOPY (EGD);  Surgeon: Toledo, Benay Pike, MD;  Location: [facility] ENDOSCOPY;  Service: Gastroenterology;  Laterality: N/A;  . IR PARACENTESIS  08/27/2019  . IR RADIOLOGIST EVAL & MGMT  08/08/2019  . IR RADIOLOGIST EVAL & MGMT  11/14/2019  . IR TIPS  08/27/2019  . RADIOLOGY WITH ANESTHESIA N/A 08/27/2019   Procedure: IR WITH ANESTHESIA TIPS PROCEDURE;  Surgeon: [name], MD;  Location: [facility];  Service: Radiology;  Laterality: N/A;  . TONSILLECTOMY       Allergies:   Patient has no known allergies.   Social History   Tobacco Use  . Smoking status: Current Every Day Smoker     Packs/day: 1.00    Years: 35.00    Pack years: 35.00    Types: Cigarettes  . Smokeless tobacco: Never Used  Vaping Use  . Vaping Use: Never used  Substance Use Topics  . Alcohol use: Not Currently  . Drug use: Never     Current Outpatient Medications on File Prior to Visit  Medication Sig Dispense Refill  . digoxin (LANOXIN) 0.125 MG tablet Take 1 tablet by mouth once daily 90 tablet 0  . FLUoxetine (PROZAC) 20 MG tablet Take 1 tablet (20 mg total) by mouth daily. 30 tablet 3  . furosemide (LASIX) 40 MG tablet Take 0.5 tablets (20 mg total) by mouth daily. 15 tablet 3  . gabapentin (NEURONTIN) 400 MG capsule Take 1 capsule (400 mg total) by mouth 3 (three) times daily. 270 capsule 0  . lactulose (CHRONULAC) 10 GM/15ML solution Take 45 mLs (30 g total) by mouth 3 (three) times daily. 236 mL 2  . midodrine (PROAMATINE) 5 MG tablet Take 1 tablet (5 mg total) by mouth 3 (three) times daily with meals. 270 tablet 0  . pantoprazole (PROTONIX) 20 MG tablet Take 1 tablet (20 mg total) by mouth every morning. One hour before breakfast 30 tablet 1  . tiZANidine (ZANAFLEX) 4 MG capsule TAKE 1 CAPSULE BY MOUTH THREE TIMES DAILY AS NEEDED FOR MUSCLE SPASM 270 capsule 1   No current facility-administered medications on file prior to visit.     Family Hx: The patient's family history includes Cancer in her father and mother.  ROS:   Please see the history of present illness.    Review of Systems  Constitutional: Negative.   HENT: Negative.   Respiratory: Positive for shortness of breath.   Cardiovascular: Positive for leg swelling.  Gastrointestinal: Negative.   Musculoskeletal: Negative.   Neurological: Negative.   Psychiatric/Behavioral: Negative.   All other systems reviewed and are negative.     Labs/Other Tests and Data Reviewed:    Recent Labs: 06/02/2020: TSH 1.624 06/03/2020: ALT 22; BUN 10; Creatinine, Ser 0.41; Hemoglobin 13.1; Magnesium 1.5; Platelets 170; Potassium 4.1;  Sodium 135   Recent Lipid Panel No results found for: CHOL, TRIG, HDL, CHOLHDL, LDLCALC, LDLDIRECT  Wt Readings from Last 3 Encounters:  06/18/20 156 lb 6.4 oz (70.9 kg)  05/30/20 127 lb (57.6 kg)  05/28/20 127 lb (57.6 kg)     Exam:    Vital Signs: Vital signs may also be detailed in the HPI BP 94/68   Pulse (!) 58   Ht 5\' 9"  (1.753 m)   Wt 156 lb 6.4 oz (70.9 kg)   BMI 23.10 kg/m   Wt Readings from Last 3 Encounters:  06/18/20 156 lb 6.4 oz (70.9 kg)  05/30/20 127 lb (57.6 kg)  05/28/20 127 lb (57.6 kg)   Temp Readings from Last 3 Encounters:  06/03/20 97.8 F (36.6 C) (Oral)  05/28/20 97.9 F (36.6 C) (Oral)  05/28/20 97.6 F (36.4 C) (Oral)   BP Readings from Last 3 Encounters:  06/18/20 94/68  06/03/20 92/64  05/28/20 128/88   Pulse Readings from Last 3  Encounters:  06/18/20 (!) 58  06/03/20 72  05/28/20 (!) 108      ASSESSMENT & PLAN:    Persistent atrial fibrillation (HCC)  Typical atrial flutter (HCC)  Alcoholic cirrhosis of liver with ascites (HCC)  Portal hypertension (HCC)  Bleeding esophageal varices, unspecified esophageal varices type ([place])  Anemia, unspecified type  Atrial flutter with RVR First documented on EKG 01/18/2019 Maintaining normal sinus rhythm Not a good candidate for anticoagulation  Atrial fibrillation with RVR Prior history of fib flutter  Leg edema Likely multifactorial,  Will continue Lasix daily, prescription refilled Was previously only taking Lasix every other day secondary to bladder spasm  Anorexia/severe protein calorie malnutrition Low weight contributing to hypotension  Hypotension Continues to have low blood pressure symptomatic orthostasis on midodrine 5 mg Recommend she increase up to 10 mg 3 times daily  Alcoholic cirrhosis Alcohol cessation recommended History of paracentesis and TIPS procedure,  Followed by GI  Very complicated history, hospital records reviewed for multiple admissions  to the emergency room  Total encounter time more than 35 minutes  Greater than 50% was spent in counseling and coordination of care with the patient    Signed, Ida Rogue, MD  06/18/2020 4:29 PM    [place] [address], [place], [place] [address]

## 2020-06-18 ENCOUNTER — Encounter: Payer: Self-pay | Admitting: Cardiovascular Disease

## 2020-06-18 ENCOUNTER — Ambulatory Visit (INDEPENDENT_AMBULATORY_CARE_PROVIDER_SITE_OTHER): Payer: Medicare Other | Admitting: Cardiovascular Disease

## 2020-06-18 ENCOUNTER — Telehealth: Payer: Self-pay

## 2020-06-18 ENCOUNTER — Other Ambulatory Visit: Payer: Self-pay

## 2020-06-18 VITALS — BP 94/68 | HR 58 | Ht 69.0 in | Wt 156.4 lb

## 2020-06-18 DIAGNOSIS — I959 Hypotension, unspecified: Secondary | ICD-10-CM

## 2020-06-18 DIAGNOSIS — D649 Anemia, unspecified: Secondary | ICD-10-CM

## 2020-06-18 DIAGNOSIS — I483 Typical atrial flutter: Secondary | ICD-10-CM

## 2020-06-18 DIAGNOSIS — K766 Portal hypertension: Secondary | ICD-10-CM

## 2020-06-18 DIAGNOSIS — K7031 Alcoholic cirrhosis of liver with ascites: Secondary | ICD-10-CM

## 2020-06-18 DIAGNOSIS — I4819 Other persistent atrial fibrillation: Secondary | ICD-10-CM | POA: Diagnosis not present

## 2020-06-18 DIAGNOSIS — I8501 Esophageal varices with bleeding: Secondary | ICD-10-CM

## 2020-06-18 MED ORDER — MIDODRINE HCL 10 MG PO TABS: 10.0000 mg | ORAL_TABLET | Freq: Three times a day (TID) | ORAL | 2 refills | Status: AC

## 2020-06-18 MED ORDER — POTASSIUM CHLORIDE ER 10 MEQ PO TBCR: 10.0000 meq | EXTENDED_RELEASE_TABLET | Freq: Every day | ORAL | 2 refills | Status: AC

## 2020-06-18 MED ORDER — FUROSEMIDE 40 MG PO TABS: 40.0000 mg | ORAL_TABLET | Freq: Every day | ORAL | 1 refills | Status: AC

## 2020-06-18 NOTE — Telephone Encounter (Signed)
9:58AM: Palliative care SW left HIPPA compliant VM for patient to schedule initial visit. Awaiting return call.

## 2020-06-18 NOTE — Patient Instructions (Addendum)
Medication Instructions:  Please incresae the midodrine up to 10 mg three times a day  Stay on lasix 40 mg daily Take with potassium 10 meq daily  If you need a refill on your cardiac medications before your next appointment, please call your pharmacy.    Lab work: No new labs needed   If you have labs (blood work) drawn today and your tests are completely normal, you will receive your results only by: [name] MyChart Message (if you have MyChart) OR . A paper copy in the mail If you have any lab test that is abnormal or we need to change your treatment, we will call you to review the results.   Testing/Procedures: No new testing needed   Follow-Up: At [facility], you and your health needs are our priority.  As part of our continuing mission to provide you with exceptional heart care, we have created designated Provider Care Teams.  These Care Teams include your primary Cardiologist (physician) and Advanced Practice Providers (APPs -  Physician Assistants and Nurse Practitioners) who all work together to provide you with the care you need, when you need it.  . You will need a follow up appointment in 3 months  . Providers on your designated Care Team:   . Murray Hodgkins, NP . Christell Faith, PA-C . Marrianne Mood, PA-C  Any Other Special Instructions Will Be Listed Below (If Applicable).  COVID-19 Vaccine Information can be found at: ShippingScam.co.uk For questions related to vaccine distribution or appointments, please email vaccine@[email or web address] .[email or web address] or call [phone].

## 2020-06-20 ENCOUNTER — Telehealth: Payer: Self-pay

## 2020-06-20 NOTE — Telephone Encounter (Signed)
10:54AM: Palliative care SW outreached patient to schedule initial visit. HIPPA compliant VM left. Awaiting return call.

## 2020-06-20 NOTE — Telephone Encounter (Signed)
Copied from [place] [phone]. Topic: General - Other >> Jun 20, 2020 11:00 AM Yvette Rack wrote: Reason for CRM: [place] with Palliative Care stated they have made several attempts to reach the patient but patient is not returning their calls. [place] stated if patient does not return their call by Monday they will close out the referral. Cb# [phone]

## 2020-06-23 ENCOUNTER — Telehealth: Payer: Self-pay

## 2020-06-23 NOTE — Telephone Encounter (Signed)
9:30AM: Patient LVM for SW. Palliative care SW returned patients call.  In home visit scheduled for Wed 11/3 @1 :30pm with palliative care SW and RN.

## 2020-06-27 ENCOUNTER — Telehealth: Payer: Self-pay

## 2020-06-27 NOTE — Telephone Encounter (Signed)
Copied from [place] [phone]. Topic: General - Other >> Jun 27, 2020  9:54 AM Leward Quan A wrote: Reason for CRM: Patient asking if Delsa Grana can please send an Rx for fluconazole (DIFLUCAN) tablet 150 mg  to the pharmacy for her say that she have a yeast infection with the break out and that Kristeen Miss is familiar with her situation. Please advise  Ph# [phone]

## 2020-06-27 NOTE — Telephone Encounter (Signed)
Please advise 

## 2020-06-30 MED ORDER — NYSTATIN 100000 UNIT/ML MT SUSP: 500000.0000 [IU] | Freq: Four times a day (QID) | OROMUCOSAL | 0 refills | Status: DC

## 2020-06-30 MED ORDER — FLUCONAZOLE 150 MG PO TABS: 150.0000 mg | ORAL_TABLET | ORAL | 0 refills | Status: DC | PRN

## 2020-06-30 NOTE — Addendum Note (Signed)
Addended by: Delsa Grana on: 06/30/2020 12:49 PM   Modules accepted: Orders

## 2020-07-01 NOTE — Telephone Encounter (Signed)
Patient notified

## 2020-07-01 NOTE — Addendum Note (Signed)
Addended by: Therisa Doyne on: 07/01/2020 04:57 PM   Modules accepted: Orders

## 2020-07-02 ENCOUNTER — Other Ambulatory Visit: Payer: Medicare Other

## 2020-07-02 ENCOUNTER — Other Ambulatory Visit: Payer: Self-pay

## 2020-07-02 VITALS — BP 120/72 | HR 62 | Resp 20

## 2020-07-02 DIAGNOSIS — Z515 Encounter for palliative care: Secondary | ICD-10-CM

## 2020-07-02 NOTE — Progress Notes (Signed)
COMMUNITY PALLIATIVE CARE SW NOTE  PATIENT NAME: Anita Reed DOB: 07-09-1961 MRN: [id number]  PRIMARY CARE PROVIDER: Towanda Malkin, MD  RESPONSIBLE PARTY:  Acct ID - Guarantor Home Phone Work Phone Relationship Acct Type  [id number] MYLIN, GIGNAC* [phone]  Self P/F     [address], [place], [place] [address]     PLAN OF CARE and INTERVENTIONS:             1. GOALS OF CARE/ ADVANCE CARE PLANNING:  Patient is a DNR. MOST form discussed. Patient's goal is to remain at home with husband and to be comfortable. 2. SOCIAL/EMOTIONAL/SPIRITUAL ASSESSMENT/ INTERVENTIONS:  SW and RN Almyra Free met with patient in patients home. Patient lives in a one story home with husband. Patient updated SW and RN medical condition and changes. Patient was recently hospitalized for not feeling well and while she was there her BP dropped. Patient has cirrhosis of liver and neuropathy along with other issues. Patient shared that she was upset because she her wishes are to be DNR and she is not sure how or why she was resuscitated. Patient shares that she eats well. Sleeps well. No recent falls reported. No pain reported other than neuropathy. Patient has a foot massager that helps some with the pain in her feet. No recent medication changes. RN reviewed medications and took vitals. Patient does not take lactulose as often due to eating apples that causes her to have regular bowel movements. Patient has a lot of gas. Patient speaks about making her advance planning discussions and that it is a tough conversation. SW discussed goals, reviewed care plan, provided emotional support, used active and reflective listening. Palliative care will continue to monitor and assist with long term care planning as needed.  3. PATIENT/CAREGIVER EDUCATION/ COPING:  Patient A&O. Patient denies any anxiety or depression. Patient takes Prozac and feels as though its helping. Patient is open to supportive counseling. 4. PERSONAL  EMERGENCY PLAN:  Patient will call 9-1-1 for emergencies.  5. COMMUNITY RESOURCES COORDINATION/ HEALTH CARE NAVIGATION:  Patient manages her care. 6. FINANCIAL/LEGAL CONCERNS/INTERVENTIONS:  None. Husband works and patients receives Fish farm manager. .    SOCIAL HX:  Social History   Tobacco Use  . Smoking status: Current Every Day Smoker    Packs/day: 1.00    Years: 35.00    Pack years: 35.00    Types: Cigarettes  . Smokeless tobacco: Never Used  Substance Use Topics  . Alcohol use: Not Currently    CODE STATUS: DNR  ADVANCED DIRECTIVES: N MOST FORM COMPLETE: discussed HOSPICE EDUCATION PROVIDED: Y  PPS: Patient is ambulatory w/o AD. Patient drives and completes all ADL's I.    Time spent: 1 hr.      Doreene Eland, LCSW

## 2020-07-02 NOTE — Progress Notes (Signed)
PATIENT NAME: Anita Reed DOB: 1961/04/03 MRN: [id number]  PRIMARY CARE PROVIDER: Towanda Malkin, MD  RESPONSIBLE PARTY:  Acct ID - Guarantor Home Phone Work Phone Relationship Acct Type  [id number] FATUMA, DOWERS* [phone]  Self P/F     [address] [place], [place] [address]    PLAN OF CARE and INTERVENTIONS:               1.  GOALS OF CARE/ ADVANCE CARE PLANNING:  Patient desires to remain home and independent.               2.  PATIENT/CAREGIVER EDUCATION:  Palliative Care Services and Cirrhosis of the Liver.               3.  DISEASE STATUS:Joint visit with [place], SW.  Greeted at the door by patient.  She is able to provide a detailed medical history.  Reports being "brought back" in the ED recently.  Full discussion on code status and MOST form are completed.  Patient has a DNR in the home and desires to keep this status.  Form has been uploaded to [employer] by SW.  We reviewed MOST form and patient would like to think about this more.  She is looking at her quality vs quantity but notes this is a very hard decision.  We will have PC NP follow up with patient to complete the MOST form.  Patient notes weakness since her hospitalization.  She states she is just starting to walk around the house more.  She is home alone during the day but her spouse is home in the evening.  Patient reports she is trying to do some household chores slowly.  She continues to drive to her appointments for now but notes she is not always comfortable with this.  Patient states she does not recall the events of the hospital encounter from last month and asked if I could explain to her what happened. Chart reviewed.  We discussed dx of septic shock and possible causes.  Discussion completed on patient's diagnosis of cirrhosis of the liver.  She is not taking her lactulose at this time.  Patient notes she is eating fruit and this is helping her to have regular bowel movements daily.  Education is provided  on use of lactulose and ammonia level.  We discussed symptoms of elevated ammonia level.  Patient has verbalized understanding but desires to continue with her current regimen and feels this is working best for her.  She notes frequent bowl movements are not conducive to her life style and she felt like she was living in the bathroom when she did take lactulose.   HISTORY OF PRESENT ILLNESS:  59 year old female with Atrial fib and Alcoholic Cirrhosis of the Liver with ascites.  Patient is being followed by Palliative Care monthly and PRN.  CODE STATUS: DNR ADVANCED DIRECTIVES: No MOST FORM: No PPS:  60%   PHYSICAL EXAM:   VITALS: Today's Vitals   07/02/20 1356  BP: 120/72  Pulse: 62    LUNGS: CTA CARDIAC: HR irregular EXTREMITIES: 3+ pitting edema to bilateral lower extremities. SKIN: Warm and dry to touch.   NEURO: Alert and oriented x 3    Time 130 pm- 250 pm   Lorenza Burton, RN

## 2020-07-03 ENCOUNTER — Ambulatory Visit (INDEPENDENT_AMBULATORY_CARE_PROVIDER_SITE_OTHER): Payer: Medicare Other | Admitting: Family Medicine

## 2020-07-03 ENCOUNTER — Encounter: Payer: Self-pay | Admitting: Family Medicine

## 2020-07-03 ENCOUNTER — Other Ambulatory Visit: Payer: Self-pay

## 2020-07-03 VITALS — BP 92/70 | HR 64 | Temp 98.1°F | Resp 16 | Ht 69.0 in | Wt 150.5 lb

## 2020-07-03 DIAGNOSIS — K7031 Alcoholic cirrhosis of liver with ascites: Secondary | ICD-10-CM

## 2020-07-03 DIAGNOSIS — N761 Subacute and chronic vaginitis: Secondary | ICD-10-CM

## 2020-07-03 DIAGNOSIS — R9389 Abnormal findings on diagnostic imaging of other specified body structures: Secondary | ICD-10-CM | POA: Diagnosis not present

## 2020-07-03 DIAGNOSIS — J029 Acute pharyngitis, unspecified: Secondary | ICD-10-CM

## 2020-07-03 DIAGNOSIS — K140 Glossitis: Secondary | ICD-10-CM | POA: Diagnosis not present

## 2020-07-03 DIAGNOSIS — Z09 Encounter for follow-up examination after completed treatment for conditions other than malignant neoplasm: Secondary | ICD-10-CM

## 2020-07-03 DIAGNOSIS — I959 Hypotension, unspecified: Secondary | ICD-10-CM | POA: Diagnosis not present

## 2020-07-03 DIAGNOSIS — R1084 Generalized abdominal pain: Secondary | ICD-10-CM

## 2020-07-03 MED ORDER — FLUCONAZOLE 150 MG PO TABS: 150.0000 mg | ORAL_TABLET | ORAL | 0 refills | Status: DC | PRN

## 2020-07-03 NOTE — Progress Notes (Signed)
Patient ID: Anita Reed, female    DOB: 03-Sep-1960, 59 y.o.   MRN: [id number]  PCP: Anita Malkin, MD  Chief Complaint  Patient presents with  . Vaginitis    yeast infection    Subjective:   Anita Reed is a 59 y.o. female, presents to clinic with CC of the following:  HPI  Patient presents for request for refills on Diflucan I previously prescribed it for recurrent mouth pain and pharyngitis In the past she states after taking a few doses she did have improvement and even resolution of her mouth and throat pain after previously having many months of recurrent erythema and pharyngitis.  After recently being in the hospital also being on multiple antibiotics she has recurrence of her throat and mouth pain and also has some yeast vaginitis symptoms.  She has not been seen since being in the hospital  Hospital course, labs were reviewed today  Patient was altered and has syncopal episode while in the ER waiting room she was admitted for several days but never moved to a room in the hospital and was discharged later from the ER room.  Since being home she has not had any syncopal episodes but her blood pressure is low.  She has followed up with her cardiologist and also has established with palliative care  She has known cirrhosis, is not currently established with a gastroenterologist  At the time of discharge her magnesium was very low and she was given IV supplement on 10/5  Reviewed her admission from May 30, 2020 to June 03, 2020 admitted for abdominal pain, dizziness, septic shock in the bradycardia room after syncopal episode blood pressure was found to be approximately 50/20 she was emergently resuscitated History of atrial flutter, lymphoma, cirrhosis status post TIPS, hypertension, symptoms began with abdominal pain vomiting dizziness and weight loss over several weeks  Patient follow-up visit with cardiology was reviewed  Patient Active Problem List    Diagnosis Date Noted  . Septic shock ([place]) 05/31/2020  . Healthcare-associated pneumonia   . Cirrhosis of liver without ascites ([place])   . Epigastric hernia 03/06/2020  . Major depressive disorder with single episode, in partial remission ([place]) 11/28/2019  . Neuropathy 11/28/2019  . Dysthymia 07/27/2019  . Other chronic pain 07/27/2019  . Muscle spasticity 07/27/2019  . C. difficile colitis   . Hypokalemia 07/15/2019  . Bandemia 07/15/2019  . Hypotension   . Goals of care, counseling/discussion   . Palliative care encounter   . Muscle cramping 06/22/2019  . Alcohol abuse 06/22/2019  . Sepsis ([place]) 06/22/2019  . GIB (gastrointestinal bleeding) 06/01/2019  . GI bleeding 06/01/2019  . History of lymphoma 02/26/2019  . Atrial flutter ([place]) 02/07/2019  . Alcoholic cirrhosis of liver with ascites ([place]) 02/07/2019  . Persistent atrial fibrillation ([place]) 02/07/2019  . Anemia 02/07/2019  . Anorexia 02/07/2019  . GI bleed 01/28/2019  . Ascites 01/18/2019  . Cancer [facility])       Current Outpatient Medications:  .  digoxin (LANOXIN) 0.125 MG tablet, Take 1 tablet by mouth once daily, Disp: 90 tablet, Rfl: 0 .  fluconazole (DIFLUCAN) 150 MG tablet, Take 1 tablet (150 mg total) by mouth every 3 (three) days as needed (for vaginal itching/yeast infection sx)., Disp: 2 tablet, Rfl: 0 .  FLUoxetine (PROZAC) 20 MG tablet, Take 1 tablet (20 mg total) by mouth daily., Disp: 30 tablet, Rfl: 3 .  furosemide (LASIX) 40 MG tablet, Take 1 tablet (40 mg total) by mouth  daily., Disp: 90 tablet, Rfl: 1 .  gabapentin (NEURONTIN) 400 MG capsule, Take 1 capsule (400 mg total) by mouth 3 (three) times daily., Disp: 270 capsule, Rfl: 0 .  midodrine (PROAMATINE) 10 MG tablet, Take 1 tablet (10 mg total) by mouth 3 (three) times daily with meals., Disp: 270 tablet, Rfl: 2 .  nystatin (MYCOSTATIN) 100000 UNIT/ML suspension, Take 5 mLs (500,000 Units total) by mouth 4 (four) times daily., Disp: 60 mL, Rfl: 0 .   pantoprazole (PROTONIX) 20 MG tablet, Take 1 tablet (20 mg total) by mouth every morning. One hour before breakfast, Disp: 30 tablet, Rfl: 1 .  potassium chloride (KLOR-CON) 10 MEQ tablet, Take 1 tablet (10 mEq total) by mouth daily., Disp: 90 tablet, Rfl: 2 .  tiZANidine (ZANAFLEX) 4 MG capsule, TAKE 1 CAPSULE BY MOUTH THREE TIMES DAILY AS NEEDED FOR MUSCLE SPASM, Disp: 270 capsule, Rfl: 1 .  lactulose (CHRONULAC) 10 GM/15ML solution, Take 45 mLs (30 g total) by mouth 3 (three) times daily. (Patient not taking: Reported on 07/02/2020), Disp: 236 mL, Rfl: 2   No Known Allergies   Social History   Tobacco Use  . Smoking status: Current Every Day Smoker    Packs/day: 1.00    Years: 35.00    Pack years: 35.00    Types: Cigarettes  . Smokeless tobacco: Never Used  Vaping Use  . Vaping Use: Never used  Substance Use Topics  . Alcohol use: Not Currently  . Drug use: Never      Chart Review Today: I personally reviewed active problem list, medication list, allergies, family history, social history, health maintenance, notes from last encounter, lab results, imaging with the patient/caregiver today.   Review of Systems 10 Systems reviewed and are negative for acute change except as noted in the HPI.     Objective:   Vitals:   07/03/20 1323  BP: 92/70  Pulse: 64  Resp: 16  Temp: 98.1 F (36.7 C)  TempSrc: Oral  SpO2: 96%  Weight: 150 lb 8 oz (68.3 kg)  Height: 5\' 9"  (1.753 m)    Body mass index is 22.22 kg/m.  Physical Exam Vitals and nursing note reviewed.  Constitutional:      General: She is not in acute distress.    Appearance: She is ill-appearing (chronically ill appearing). She is not toxic-appearing or diaphoretic.  HENT:     Head: Normocephalic and atraumatic.     Right Ear: External ear normal.     Left Ear: External ear normal.     Mouth/Throat:     Mouth: Mucous membranes are dry.     Pharynx: Oropharynx is clear. Uvula midline. Posterior oropharyngeal  erythema present. No oropharyngeal exudate or uvula swelling.     Tonsils: No tonsillar exudate or tonsillar abscesses.  Cardiovascular:     Pulses: Normal pulses.     Heart sounds: Normal heart sounds.  Pulmonary:     Effort: Pulmonary effort is normal. No respiratory distress.     Breath sounds: Normal breath sounds. No stridor. No wheezing or rales.     Comments: Increased AP diameter, good breath sounds throughout all lung fields with anterior and posterior auscultation Abdominal:     General: Bowel sounds are normal.     Palpations: Abdomen is soft.     Tenderness: There is no abdominal tenderness.  Musculoskeletal:     Right lower leg: No edema.     Left lower leg: No edema.  Neurological:     Mental  Status: She is alert.     Gait: Gait normal.  Psychiatric:        Behavior: Behavior normal.      Results for orders placed or performed during the hospital encounter of 05/30/20  Blood culture (routine x 2)   Specimen: BLOOD  Result Value Ref Range   Specimen Description BLOOD LEFT ANTECUBITAL    Special Requests      BOTTLES DRAWN AEROBIC AND ANAEROBIC Blood Culture results may not be optimal due to Reed inadequate volume of blood received in culture bottles   Culture      NO GROWTH 5 DAYS Performed at [facility], [address] [place], [place] [address]    Report Status 06/04/2020 FINAL   Blood culture (routine x 2)   Specimen: BLOOD  Result Value Ref Range   Specimen Description BLOOD BLOOD RIGHT FOREARM    Special Requests      BOTTLES DRAWN AEROBIC AND ANAEROBIC Blood Culture results may not be optimal due to Reed inadequate volume of blood received in culture bottles   Culture      NO GROWTH 5 DAYS Performed at [facility], [address] [place], [place] [address]    Report Status 06/04/2020 FINAL   Respiratory Panel by RT PCR (Flu A&B, Covid) - Nasopharyngeal Swab   Specimen: Nasopharyngeal Swab  Result Value Ref Range   SARS  Coronavirus 2 by RT PCR NEGATIVE NEGATIVE   Influenza A by PCR NEGATIVE NEGATIVE   Influenza B by PCR NEGATIVE NEGATIVE  Culture, blood (single)   Specimen: BLOOD LEFT WRIST  Result Value Ref Range   Specimen Description BLOOD LEFT WRIST    Special Requests      BOTTLES DRAWN AEROBIC ONLY Blood Culture results may not be optimal due to Reed inadequate volume of blood received in culture bottles   Culture      NO GROWTH 5 DAYS Performed at [facility], [address] [place], [place] [address]    Report Status 06/05/2020 FINAL   MRSA PCR Screening   Specimen: Nasal Mucosa; Nasopharyngeal  Result Value Ref Range   MRSA by PCR NEGATIVE NEGATIVE  CBC  Result Value Ref Range   WBC 16.2 (H) 4.0 - 10.5 K/uL   RBC 5.11 3.87 - 5.11 MIL/uL   Hemoglobin 16.3 (H) 12.0 - 15.0 g/dL   HCT 47.3 (H) 36 - 46 %   MCV 92.6 80.0 - 100.0 fL   MCH 31.9 26.0 - 34.0 pg   MCHC 34.5 30.0 - 36.0 g/dL   RDW 16.6 (H) 11.5 - 15.5 %   Platelets 212 150 - 400 K/uL   nRBC 0.0 0.0 - 0.2 %  Comprehensive metabolic panel  Result Value Ref Range   Sodium 140 135 - 145 mmol/L   Potassium 3.0 (L) 3.5 - 5.1 mmol/L   Chloride 101 98 - 111 mmol/L   CO2 29 22 - 32 mmol/L   Glucose, Bld 97 70 - 99 mg/dL   BUN 10 6 - 20 mg/dL   Creatinine, Ser 0.63 0.44 - 1.00 mg/dL   Calcium 9.0 8.9 - 10.3 mg/dL   Total Protein 6.4 (L) 6.5 - 8.1 g/dL   Albumin 3.6 3.5 - 5.0 g/dL   AST 58 (H) 15 - 41 U/L   ALT 28 0 - 44 U/L   Alkaline Phosphatase 228 (H) 38 - 126 U/L   Total Bilirubin 4.6 (H) 0.3 - 1.2 mg/dL   GFR calc non Af  Amer >60 >60 mL/min   GFR calc Af Amer >60 >60 mL/min   Anion gap 10 5 - 15  Protime-INR  Result Value Ref Range   Prothrombin Time 15.9 (H) 11.4 - 15.2 seconds   INR 1.3 (H) 0.8 - 1.2  Glucose, capillary  Result Value Ref Range   Glucose-Capillary 140 (H) 70 - 99 mg/dL  Lactic acid, plasma  Result Value Ref Range   Lactic Acid, Venous 2.6 (HH) 0.5 - 1.9 mmol/L  Lactic acid, plasma    Result Value Ref Range   Lactic Acid, Venous 3.0 (HH) 0.5 - 1.9 mmol/L  CBC with Differential/Platelet  Result Value Ref Range   WBC 10.7 (H) 4.0 - 10.5 K/uL   RBC 3.68 (L) 3.87 - 5.11 MIL/uL   Hemoglobin 12.2 12.0 - 15.0 g/dL   HCT 35.8 (L) 36 - 46 %   MCV 97.3 80.0 - 100.0 fL   MCH 33.2 26.0 - 34.0 pg   MCHC 34.1 30.0 - 36.0 g/dL   RDW 16.9 (H) 11.5 - 15.5 %   Platelets 178 150 - 400 K/uL   nRBC 0.0 0.0 - 0.2 %   Neutrophils Relative % 61 %   Neutro Abs 6.6 1.7 - 7.7 K/uL   Lymphocytes Relative 24 %   Lymphs Abs 2.6 0.7 - 4.0 K/uL   Monocytes Relative 11 %   Monocytes Absolute 1.1 (H) 0.1 - 1.0 K/uL   Eosinophils Relative 2 %   Eosinophils Absolute 0.2 0.0 - 0.5 K/uL   Basophils Relative 1 %   Basophils Absolute 0.1 0.0 - 0.1 K/uL   Immature Granulocytes 1 %   Abs Immature Granulocytes 0.08 (H) 0.00 - 0.07 K/uL  Ethanol  Result Value Ref Range   Alcohol, Ethyl (B) <10 <10 mg/dL  Ammonia  Result Value Ref Range   Ammonia 45 (H) 9 - 35 umol/L  Comprehensive metabolic panel  Result Value Ref Range   Sodium 137 135 - 145 mmol/L   Potassium 3.4 (L) 3.5 - 5.1 mmol/L   Chloride 103 98 - 111 mmol/L   CO2 24 22 - 32 mmol/L   Glucose, Bld 143 (H) 70 - 99 mg/dL   BUN 12 6 - 20 mg/dL   Creatinine, Ser 1.04 (H) 0.44 - 1.00 mg/dL   Calcium 8.3 (L) 8.9 - 10.3 mg/dL   Total Protein 5.8 (L) 6.5 - 8.1 g/dL   Albumin 3.1 (L) 3.5 - 5.0 g/dL   AST 54 (H) 15 - 41 U/L   ALT 21 0 - 44 U/L   Alkaline Phosphatase 206 (H) 38 - 126 U/L   Total Bilirubin 5.2 (H) 0.3 - 1.2 mg/dL   GFR calc non Af Amer 59 (L) >60 mL/min   GFR calc Af Amer >60 >60 mL/min   Anion gap 10 5 - 15  Legionella Pneumophila Serogp 1 Ur Ag  Result Value Ref Range   L. pneumophila Serogp 1 Ur Ag Negative Negative   Source of Sample URINE, RANDOM   CBC  Result Value Ref Range   WBC 13.8 (H) 4.0 - 10.5 K/uL   RBC 4.15 3.87 - 5.11 MIL/uL   Hemoglobin 13.7 12.0 - 15.0 g/dL   HCT 38.4 36 - 46 %   MCV 92.5 80.0 -  100.0 fL   MCH 33.0 26.0 - 34.0 pg   MCHC 35.7 30.0 - 36.0 g/dL   RDW 16.9 (H) 11.5 - 15.5 %   Platelets 202 150 - 400 K/uL  nRBC 0.0 0.0 - 0.2 %  Basic metabolic panel  Result Value Ref Range   Sodium 138 135 - 145 mmol/L   Potassium 3.3 (L) 3.5 - 5.1 mmol/L   Chloride 104 98 - 111 mmol/L   CO2 26 22 - 32 mmol/L   Glucose, Bld 97 70 - 99 mg/dL   BUN 13 6 - 20 mg/dL   Creatinine, Ser 0.76 0.44 - 1.00 mg/dL   Calcium 8.2 (L) 8.9 - 10.3 mg/dL   GFR calc non Af Amer >60 >60 mL/min   GFR calc Af Amer >60 >60 mL/min   Anion gap 8 5 - 15  Magnesium  Result Value Ref Range   Magnesium 1.7 1.7 - 2.4 mg/dL  Phosphorus  Result Value Ref Range   Phosphorus 3.5 2.5 - 4.6 mg/dL  Strep pneumoniae urinary antigen  (not at [facility])  Result Value Ref Range   Strep Pneumo Urinary Antigen NEGATIVE NEGATIVE  Urine Drug Screen, Qualitative ([facility] only)  Result Value Ref Range   Tricyclic, Ur Screen NONE DETECTED NONE DETECTED   Amphetamines, Ur Screen NONE DETECTED NONE DETECTED   MDMA (Ecstasy)Ur Screen NONE DETECTED NONE DETECTED   Cocaine Metabolite,Ur [place] NONE DETECTED NONE DETECTED   Opiate, Ur Screen NONE DETECTED NONE DETECTED   Phencyclidine (PCP) Ur S NONE DETECTED NONE DETECTED   Cannabinoid 50 Ng, Ur [place] NONE DETECTED NONE DETECTED   Barbiturates, Ur Screen NONE DETECTED NONE DETECTED   Benzodiazepine, Ur Scrn NONE DETECTED NONE DETECTED   Methadone Scn, Ur NONE DETECTED NONE DETECTED  Basic metabolic panel  Result Value Ref Range   Sodium 137 135 - 145 mmol/L   Potassium 3.8 3.5 - 5.1 mmol/L   Chloride 108 98 - 111 mmol/L   CO2 19 (L) 22 - 32 mmol/L   Glucose, Bld 133 (H) 70 - 99 mg/dL   BUN 14 6 - 20 mg/dL   Creatinine, Ser 0.55 0.44 - 1.00 mg/dL   Calcium 7.9 (L) 8.9 - 10.3 mg/dL   GFR calc non Af Amer >60 >60 mL/min   GFR calc Af Amer >60 >60 mL/min   Anion gap 10 5 - 15  Basic metabolic panel  Result Value Ref Range   Sodium 138 135 - 145 mmol/L   Potassium 4.3 3.5 - 5.1  mmol/L   Chloride 108 98 - 111 mmol/L   CO2 24 22 - 32 mmol/L   Glucose, Bld 108 (H) 70 - 99 mg/dL   BUN 9 6 - 20 mg/dL   Creatinine, Ser 0.47 0.44 - 1.00 mg/dL   Calcium 8.3 (L) 8.9 - 10.3 mg/dL   GFR calc non Af Amer >60 >60 mL/min   GFR calc Af Amer >60 >60 mL/min   Anion gap 6 5 - 15  Magnesium  Result Value Ref Range   Magnesium 1.7 1.7 - 2.4 mg/dL  Cortisol  Result Value Ref Range   Cortisol, Plasma 16.1 ug/dL  TSH  Result Value Ref Range   TSH 1.624 0.350 - 4.500 uIU/mL  CBC  Result Value Ref Range   WBC 15.3 (H) 4.0 - 10.5 K/uL   RBC 4.04 3.87 - 5.11 MIL/uL   Hemoglobin 13.1 12.0 - 15.0 g/dL   HCT 38.8 36 - 46 %   MCV 96.0 80.0 - 100.0 fL   MCH 32.4 26.0 - 34.0 pg   MCHC 33.8 30.0 - 36.0 g/dL   RDW 16.7 (H) 11.5 - 15.5 %   Platelets 170 150 - 400  K/uL   nRBC 0.0 0.0 - 0.2 %  Comprehensive metabolic panel  Result Value Ref Range   Sodium 135 135 - 145 mmol/L   Potassium 4.1 3.5 - 5.1 mmol/L   Chloride 109 98 - 111 mmol/L   CO2 20 (L) 22 - 32 mmol/L   Glucose, Bld 110 (H) 70 - 99 mg/dL   BUN 10 6 - 20 mg/dL   Creatinine, Ser 0.41 (L) 0.44 - 1.00 mg/dL   Calcium 8.1 (L) 8.9 - 10.3 mg/dL   Total Protein 5.3 (L) 6.5 - 8.1 g/dL   Albumin 2.8 (L) 3.5 - 5.0 g/dL   AST 42 (H) 15 - 41 U/L   ALT 22 0 - 44 U/L   Alkaline Phosphatase 227 (H) 38 - 126 U/L   Total Bilirubin 3.4 (H) 0.3 - 1.2 mg/dL   GFR calc non Af Amer >60 >60 mL/min   GFR calc Af Amer >60 >60 mL/min   Anion gap 6 5 - 15  Magnesium  Result Value Ref Range   Magnesium 1.5 (L) 1.7 - 2.4 mg/dL  Phosphorus  Result Value Ref Range   Phosphorus 1.9 (L) 2.5 - 4.6 mg/dL  Type and screen Ordered by PROVIDER DEFAULT  Result Value Ref Range   ABO/RH(D) O POS    Antibody Screen NEG    Sample Expiration      06/02/2020,2359 Performed at [facility], [address] [place], [place] [address]   Troponin I (High Sensitivity)  Result Value Ref Range   Troponin I (High Sensitivity) 60 (H) <18  ng/L  Troponin I (High Sensitivity)  Result Value Ref Range   Troponin I (High Sensitivity) 56 (H) <18 ng/L       Assessment & Plan:     ICD-10-CM   1. Abnormal CXR  R93.89 DG Chest 2 View   Follow-up on abnormal chest x-ray from the hospital  2. Alcoholic cirrhosis of liver with ascites ([place])  R15.40 COMPLETE METABOLIC PANEL WITH GFR    Ambulatory referral to Gastroenterology   Get follow-up with GI  3. Hypotension, unspecified hypotension type  G86.7 COMPLETE METABOLIC PANEL WITH GFR   Managed by cardiology, blood pressure soft but no recent syncope  4. Glossitis  K14.0 fluconazole (DIFLUCAN) 150 MG tablet   Recurrent glossitis and pharyngitis possibly oral thrush, if not improving have discussed need for further testing  5. Pharyngitis, unspecified etiology  J02.9    Treat again for oral thrush per patient preference, states that it improved for the first time in a long time after last Diflucan prescription  6. Subacute vaginitis  N76.1 fluconazole (DIFLUCAN) 150 MG tablet   Vaginitis secondary to antibiotics, can use over-the-counter medications and can use p.o. Diflucan  7. Hypomagnesemia  E83.42 Magnesium   Low magnesium in the hospital at time of discharge will recheck today  8. Generalized abdominal pain  R10.84 Ambulatory referral to Gastroenterology  9. Encounter for examination following treatment at hospital  Z09    Had not followed up with PCP, reviewed chart, admission, labs, imaging results   abd pain for months, CT angio shows some mild distal esophageal wall thickening, improved splenomegaly, elevated left hemidiaphragm, cirrhosis with nodular hepatic contours unchanged appearance of gallbladder and biliary dilatation from prior CT scans otherwise: IMPRESSION: 1. Thoracoabdominal aortic atherosclerosis without dissection or acute aortic abnormality. No central pulmonary embolus. 2. Consolidation in the dependent left lower lobe with associated elevated  hemidiaphragm, atelectasis versus pneumonia. 3. Cirrhosis with nodular hepatic contours.  TIPS in place, patency not well assessed on this arterial phase exam. Trace ascites in the left upper quadrant. 4. Similar appearance of gallbladder and biliary dilatation from prior exam.  Aortic Atherosclerosis (ICD10-I70.0) and Emphysema (ICD10-J43.9).  Pt to continue protonix and f/up with GI  She reports that she was told to take lactulose but cannot tolerate this  Cardiology is managing her hypertension on midodrine potassium digoxin and Lasix    Electronically Signed   By: Keith Rake M.D.   On: 05/30/2020 22:23     Delsa Grana, PA-C 07/03/20 1:41 PM

## 2020-07-04 LAB — COMPLETE METABOLIC PANEL WITH GFR
AG Ratio: 1.6 (calc) (ref 1.0–2.5)
ALT: 22 U/L (ref 6–29)
AST: 60 U/L — ABNORMAL HIGH (ref 10–35)
Albumin: 3.5 g/dL — ABNORMAL LOW (ref 3.6–5.1)
Alkaline phosphatase (APISO): 328 U/L — ABNORMAL HIGH (ref 37–153)
BUN/Creatinine Ratio: 11 (calc) (ref 6–22)
BUN: 6 mg/dL — ABNORMAL LOW (ref 7–25)
CO2: 31 mmol/L (ref 20–32)
Calcium: 9.1 mg/dL (ref 8.6–10.4)
Chloride: 102 mmol/L (ref 98–110)
Creat: 0.57 mg/dL (ref 0.50–1.05)
GFR, Est African American: 118 mL/min/{1.73_m2} (ref >=60)
GFR, Est Non African American: 102 mL/min/{1.73_m2} (ref >=60)
Globulin: 2.2 g/dL (calc) (ref 1.9–3.7)
Glucose, Bld: 100 mg/dL — ABNORMAL HIGH (ref 65–99)
Potassium: 4.4 mmol/L (ref 3.5–5.3)
Sodium: 141 mmol/L (ref 135–146)
Total Bilirubin: 3.4 mg/dL — ABNORMAL HIGH (ref 0.2–1.2)
Total Protein: 5.7 g/dL — ABNORMAL LOW (ref 6.1–8.1)

## 2020-07-04 LAB — MAGNESIUM: Magnesium: 1.6 mg/dL (ref 1.5–2.5)

## 2020-07-07 ENCOUNTER — Other Ambulatory Visit: Payer: Self-pay | Admitting: Internal Medicine

## 2020-07-07 ENCOUNTER — Telehealth: Payer: Self-pay

## 2020-07-07 ENCOUNTER — Encounter: Payer: Self-pay | Admitting: Family Medicine

## 2020-07-07 DIAGNOSIS — F341 Dysthymic disorder: Secondary | ICD-10-CM

## 2020-07-07 NOTE — Telephone Encounter (Signed)
Phone call placed to patient to follow up on request to schedule a telemedicine visit with Palliative NP. Call back information provided.

## 2020-07-08 ENCOUNTER — Telehealth: Payer: Self-pay | Admitting: Primary Care

## 2020-07-08 ENCOUNTER — Other Ambulatory Visit: Payer: Self-pay | Admitting: Internal Medicine

## 2020-07-08 DIAGNOSIS — F341 Dysthymic disorder: Secondary | ICD-10-CM

## 2020-07-08 NOTE — Telephone Encounter (Signed)
Called cell number on file, stated it was a business number. Called another contact, no answer, message left to book appt with [facility] NP per pt request.

## 2020-07-09 ENCOUNTER — Telehealth: Payer: Self-pay

## 2020-07-09 NOTE — Telephone Encounter (Signed)
Phone call placed to patient to schedule telehealth visit with NP to discuss MOST form. Scheduled for 07/10/20 @ 2pm with Ralene Bathe NP

## 2020-07-10 ENCOUNTER — Other Ambulatory Visit: Payer: Medicare Other | Admitting: Primary Care

## 2020-07-10 ENCOUNTER — Other Ambulatory Visit: Payer: Self-pay

## 2020-07-10 ENCOUNTER — Telehealth: Payer: Self-pay

## 2020-07-10 NOTE — Telephone Encounter (Signed)
Copied from [place] [phone]. Topic: General - Other >> Jul 10, 2020 10:13 AM Anita Reed wrote: Reason for CRM: Patient called in wanted to inform the nurse that medication was Prozac.

## 2020-07-29 ENCOUNTER — Emergency Department: Payer: Medicare Other

## 2020-07-29 ENCOUNTER — Inpatient Hospital Stay
Admission: EM | Admit: 2020-07-29 | Discharge: 2020-08-06 | DRG: 433 | Disposition: A | Discharge status: Home Health | Payer: Medicare Other | Attending: Family Medicine | Admitting: Family Medicine

## 2020-07-29 ENCOUNTER — Telehealth: Payer: Self-pay | Admitting: Gastroenterology

## 2020-07-29 ENCOUNTER — Other Ambulatory Visit: Payer: Self-pay

## 2020-07-29 DIAGNOSIS — K7030 Alcoholic cirrhosis of liver without ascites: Secondary | ICD-10-CM | POA: Diagnosis not present

## 2020-07-29 DIAGNOSIS — K7040 Alcoholic hepatic failure without coma: Principal | ICD-10-CM | POA: Diagnosis present

## 2020-07-29 DIAGNOSIS — R636 Underweight: Secondary | ICD-10-CM

## 2020-07-29 DIAGNOSIS — J3489 Other specified disorders of nose and nasal sinuses: Secondary | ICD-10-CM | POA: Diagnosis not present

## 2020-07-29 DIAGNOSIS — E7220 Disorder of urea cycle metabolism, unspecified: Secondary | ICD-10-CM

## 2020-07-29 DIAGNOSIS — M545 Low back pain, unspecified: Secondary | ICD-10-CM | POA: Diagnosis not present

## 2020-07-29 DIAGNOSIS — Y92009 Unspecified place in unspecified non-institutional (private) residence as the place of occurrence of the external cause: Secondary | ICD-10-CM | POA: Diagnosis not present

## 2020-07-29 DIAGNOSIS — I517 Cardiomegaly: Secondary | ICD-10-CM | POA: Diagnosis not present

## 2020-07-29 DIAGNOSIS — Z8719 Personal history of other diseases of the digestive system: Secondary | ICD-10-CM

## 2020-07-29 DIAGNOSIS — K7460 Unspecified cirrhosis of liver: Secondary | ICD-10-CM | POA: Diagnosis present

## 2020-07-29 DIAGNOSIS — R296 Repeated falls: Secondary | ICD-10-CM | POA: Diagnosis present

## 2020-07-29 DIAGNOSIS — D72829 Elevated white blood cell count, unspecified: Secondary | ICD-10-CM

## 2020-07-29 DIAGNOSIS — R410 Disorientation, unspecified: Secondary | ICD-10-CM

## 2020-07-29 DIAGNOSIS — Z20822 Contact with and (suspected) exposure to covid-19: Secondary | ICD-10-CM | POA: Diagnosis present

## 2020-07-29 DIAGNOSIS — M4856XA Collapsed vertebra, not elsewhere classified, lumbar region, initial encounter for fracture: Secondary | ICD-10-CM | POA: Diagnosis present

## 2020-07-29 DIAGNOSIS — R0602 Shortness of breath: Secondary | ICD-10-CM | POA: Diagnosis not present

## 2020-07-29 DIAGNOSIS — R079 Chest pain, unspecified: Secondary | ICD-10-CM | POA: Diagnosis not present

## 2020-07-29 DIAGNOSIS — G629 Polyneuropathy, unspecified: Secondary | ICD-10-CM | POA: Diagnosis present

## 2020-07-29 DIAGNOSIS — J90 Pleural effusion, not elsewhere classified: Secondary | ICD-10-CM | POA: Diagnosis not present

## 2020-07-29 DIAGNOSIS — Z681 Body mass index (BMI) 19 or less, adult: Secondary | ICD-10-CM

## 2020-07-29 DIAGNOSIS — K7031 Alcoholic cirrhosis of liver with ascites: Secondary | ICD-10-CM | POA: Diagnosis present

## 2020-07-29 DIAGNOSIS — I248 Other forms of acute ischemic heart disease: Secondary | ICD-10-CM | POA: Diagnosis present

## 2020-07-29 DIAGNOSIS — S32010A Wedge compression fracture of first lumbar vertebra, initial encounter for closed fracture: Secondary | ICD-10-CM

## 2020-07-29 DIAGNOSIS — S32020A Wedge compression fracture of second lumbar vertebra, initial encounter for closed fracture: Secondary | ICD-10-CM | POA: Diagnosis not present

## 2020-07-29 DIAGNOSIS — E870 Hyperosmolality and hypernatremia: Secondary | ICD-10-CM | POA: Diagnosis not present

## 2020-07-29 DIAGNOSIS — R519 Headache, unspecified: Secondary | ICD-10-CM | POA: Diagnosis not present

## 2020-07-29 DIAGNOSIS — K7682 Hepatic encephalopathy: Secondary | ICD-10-CM

## 2020-07-29 DIAGNOSIS — W19XXXA Unspecified fall, initial encounter: Secondary | ICD-10-CM

## 2020-07-29 DIAGNOSIS — R1310 Dysphagia, unspecified: Secondary | ICD-10-CM | POA: Diagnosis present

## 2020-07-29 DIAGNOSIS — I10 Essential (primary) hypertension: Secondary | ICD-10-CM | POA: Diagnosis present

## 2020-07-29 DIAGNOSIS — E876 Hypokalemia: Secondary | ICD-10-CM | POA: Diagnosis present

## 2020-07-29 DIAGNOSIS — I8510 Secondary esophageal varices without bleeding: Secondary | ICD-10-CM | POA: Diagnosis present

## 2020-07-29 DIAGNOSIS — K7200 Acute and subacute hepatic failure without coma: Secondary | ICD-10-CM | POA: Diagnosis not present

## 2020-07-29 DIAGNOSIS — Z8572 Personal history of non-Hodgkin lymphomas: Secondary | ICD-10-CM

## 2020-07-29 DIAGNOSIS — S32010D Wedge compression fracture of first lumbar vertebra, subsequent encounter for fracture with routine healing: Secondary | ICD-10-CM | POA: Diagnosis not present

## 2020-07-29 DIAGNOSIS — I9589 Other hypotension: Secondary | ICD-10-CM | POA: Diagnosis present

## 2020-07-29 DIAGNOSIS — S32000A Wedge compression fracture of unspecified lumbar vertebra, initial encounter for closed fracture: Secondary | ICD-10-CM

## 2020-07-29 DIAGNOSIS — R778 Other specified abnormalities of plasma proteins: Secondary | ICD-10-CM | POA: Diagnosis not present

## 2020-07-29 DIAGNOSIS — I480 Paroxysmal atrial fibrillation: Secondary | ICD-10-CM | POA: Diagnosis present

## 2020-07-29 DIAGNOSIS — R000 Tachycardia, unspecified: Secondary | ICD-10-CM | POA: Diagnosis not present

## 2020-07-29 DIAGNOSIS — R42 Dizziness and giddiness: Secondary | ICD-10-CM | POA: Diagnosis not present

## 2020-07-29 DIAGNOSIS — R7989 Other specified abnormal findings of blood chemistry: Secondary | ICD-10-CM

## 2020-07-29 LAB — CBC
HCT: 41.7 % (ref 36.0–46.0)
Hemoglobin: 14.4 g/dL (ref 12.0–15.0)
MCH: 33 pg (ref 26.0–34.0)
MCHC: 34.5 g/dL (ref 30.0–36.0)
MCV: 95.6 fL (ref 80.0–100.0)
Platelets: 178 10*3/uL (ref 150–400)
RBC: 4.36 MIL/uL (ref 3.87–5.11)
RDW: 16.6 % — ABNORMAL HIGH (ref 11.5–15.5)
WBC: 14.2 10*3/uL — ABNORMAL HIGH (ref 4.0–10.5)
nRBC: 0.1 % (ref 0.0–0.2)

## 2020-07-29 LAB — BASIC METABOLIC PANEL
Anion gap: 13 (ref 5–15)
BUN: 14 mg/dL (ref 6–20)
CO2: 26 mmol/L (ref 22–32)
Calcium: 9 mg/dL (ref 8.9–10.3)
Chloride: 106 mmol/L (ref 98–111)
Creatinine, Ser: 0.54 mg/dL (ref 0.44–1.00)
GFR, Estimated: >60 mL/min (ref >60)
Glucose, Bld: 109 mg/dL — ABNORMAL HIGH (ref 70–99)
Potassium: 3.4 mmol/L — ABNORMAL LOW (ref 3.5–5.1)
Sodium: 145 mmol/L (ref 135–145)

## 2020-07-29 LAB — HEPATIC FUNCTION PANEL
ALT: 37 U/L (ref 0–44)
AST: 68 U/L — ABNORMAL HIGH (ref 15–41)
Albumin: 3.2 g/dL — ABNORMAL LOW (ref 3.5–5.0)
Alkaline Phosphatase: 221 U/L — ABNORMAL HIGH (ref 38–126)
Bilirubin, Direct: 2.2 mg/dL — ABNORMAL HIGH (ref 0.0–0.2)
Indirect Bilirubin: 3.5 mg/dL — ABNORMAL HIGH (ref 0.3–0.9)
Total Bilirubin: 5.7 mg/dL — ABNORMAL HIGH (ref 0.3–1.2)
Total Protein: 6 g/dL — ABNORMAL LOW (ref 6.5–8.1)

## 2020-07-29 LAB — RESP PANEL BY RT-PCR (FLU A&B, COVID) ARPGX2
Influenza A by PCR: NEGATIVE
Influenza B by PCR: NEGATIVE
SARS Coronavirus 2 by RT PCR: NEGATIVE

## 2020-07-29 LAB — TROPONIN I (HIGH SENSITIVITY)
Troponin I (High Sensitivity): 32 ng/L — ABNORMAL HIGH (ref <18)
Troponin I (High Sensitivity): 38 ng/L — ABNORMAL HIGH (ref <18)

## 2020-07-29 LAB — AMMONIA: Ammonia: 46 umol/L — ABNORMAL HIGH (ref 9–35)

## 2020-07-29 LAB — PROCALCITONIN: Procalcitonin: <0.1 ng/mL

## 2020-07-29 IMAGING — CR DG CHEST 2V
1 series · 2 of 2 positions shown · non-contrast
Comparison: [DATE]

CLINICAL DATA: Chest pain, dizziness and shortness of breath

EXAM:
CHEST - 2 VIEW

[Series 1: dg chest 2 view · 0.14mm/px · 2 of 2 slices shown]
[im 1/2]
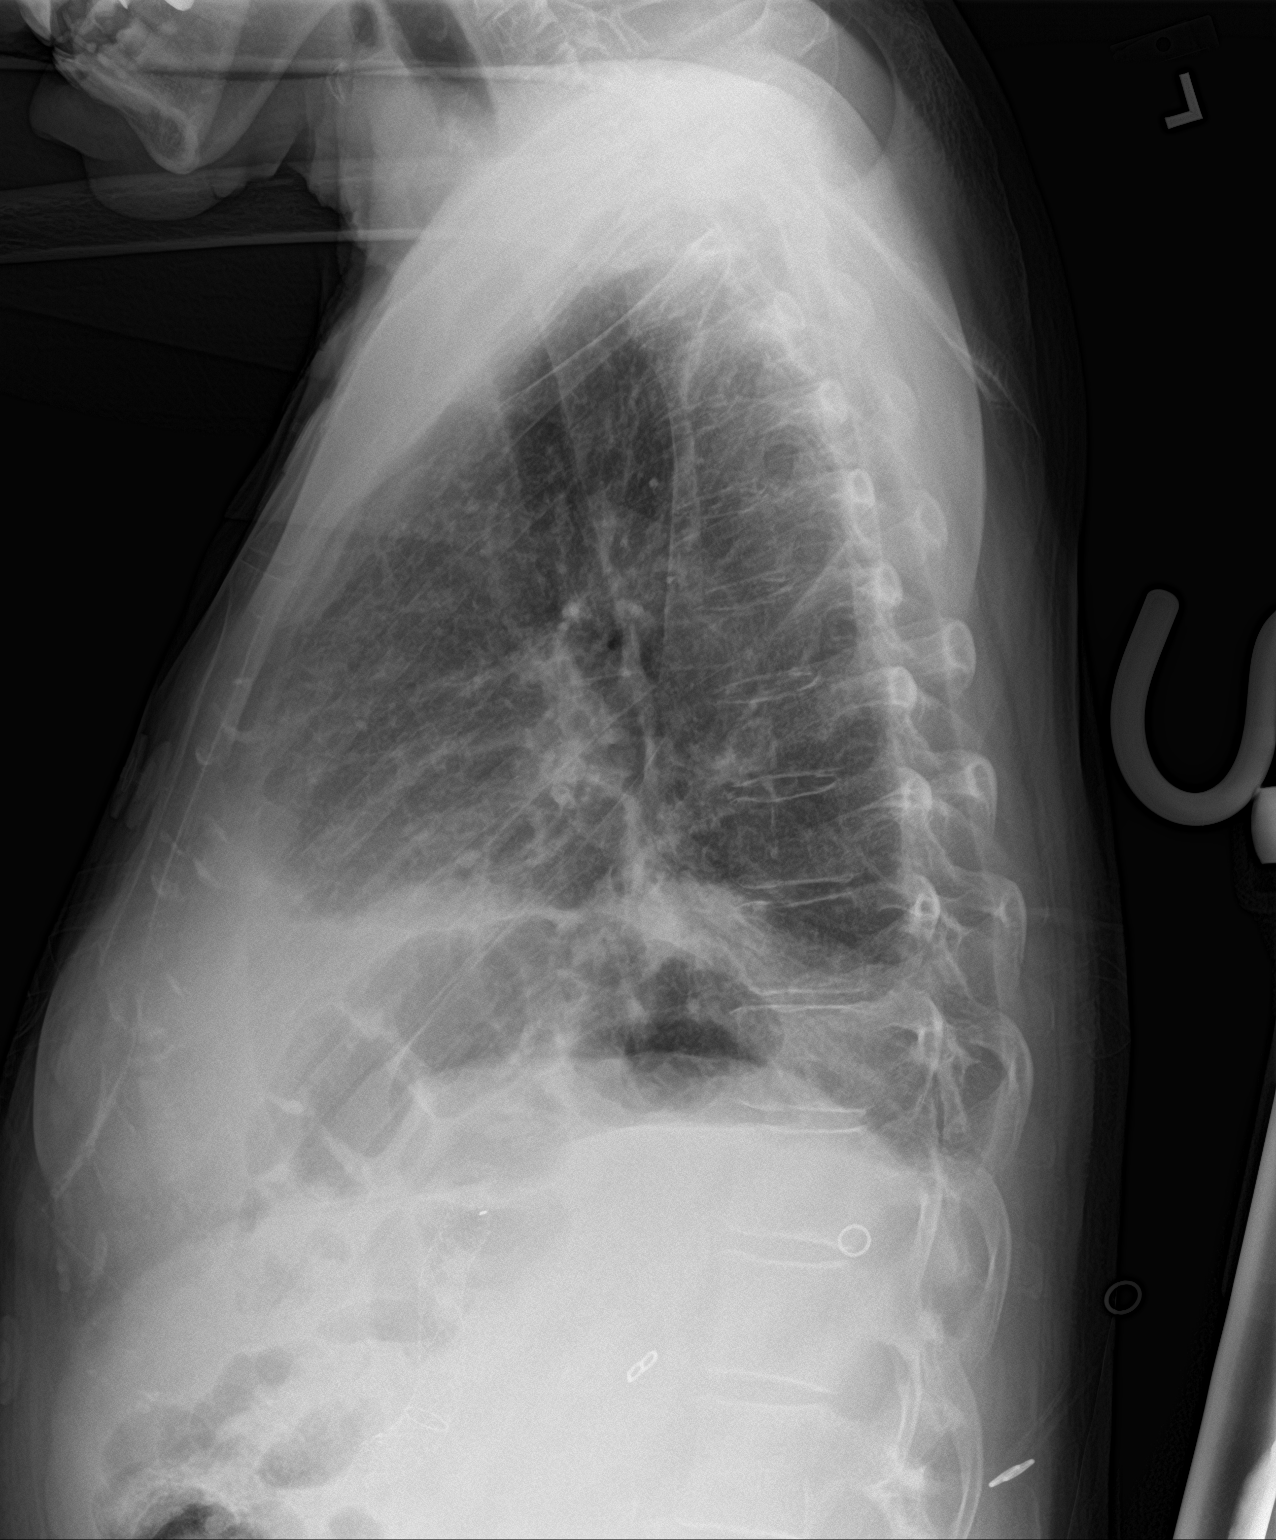
[im 2/2]
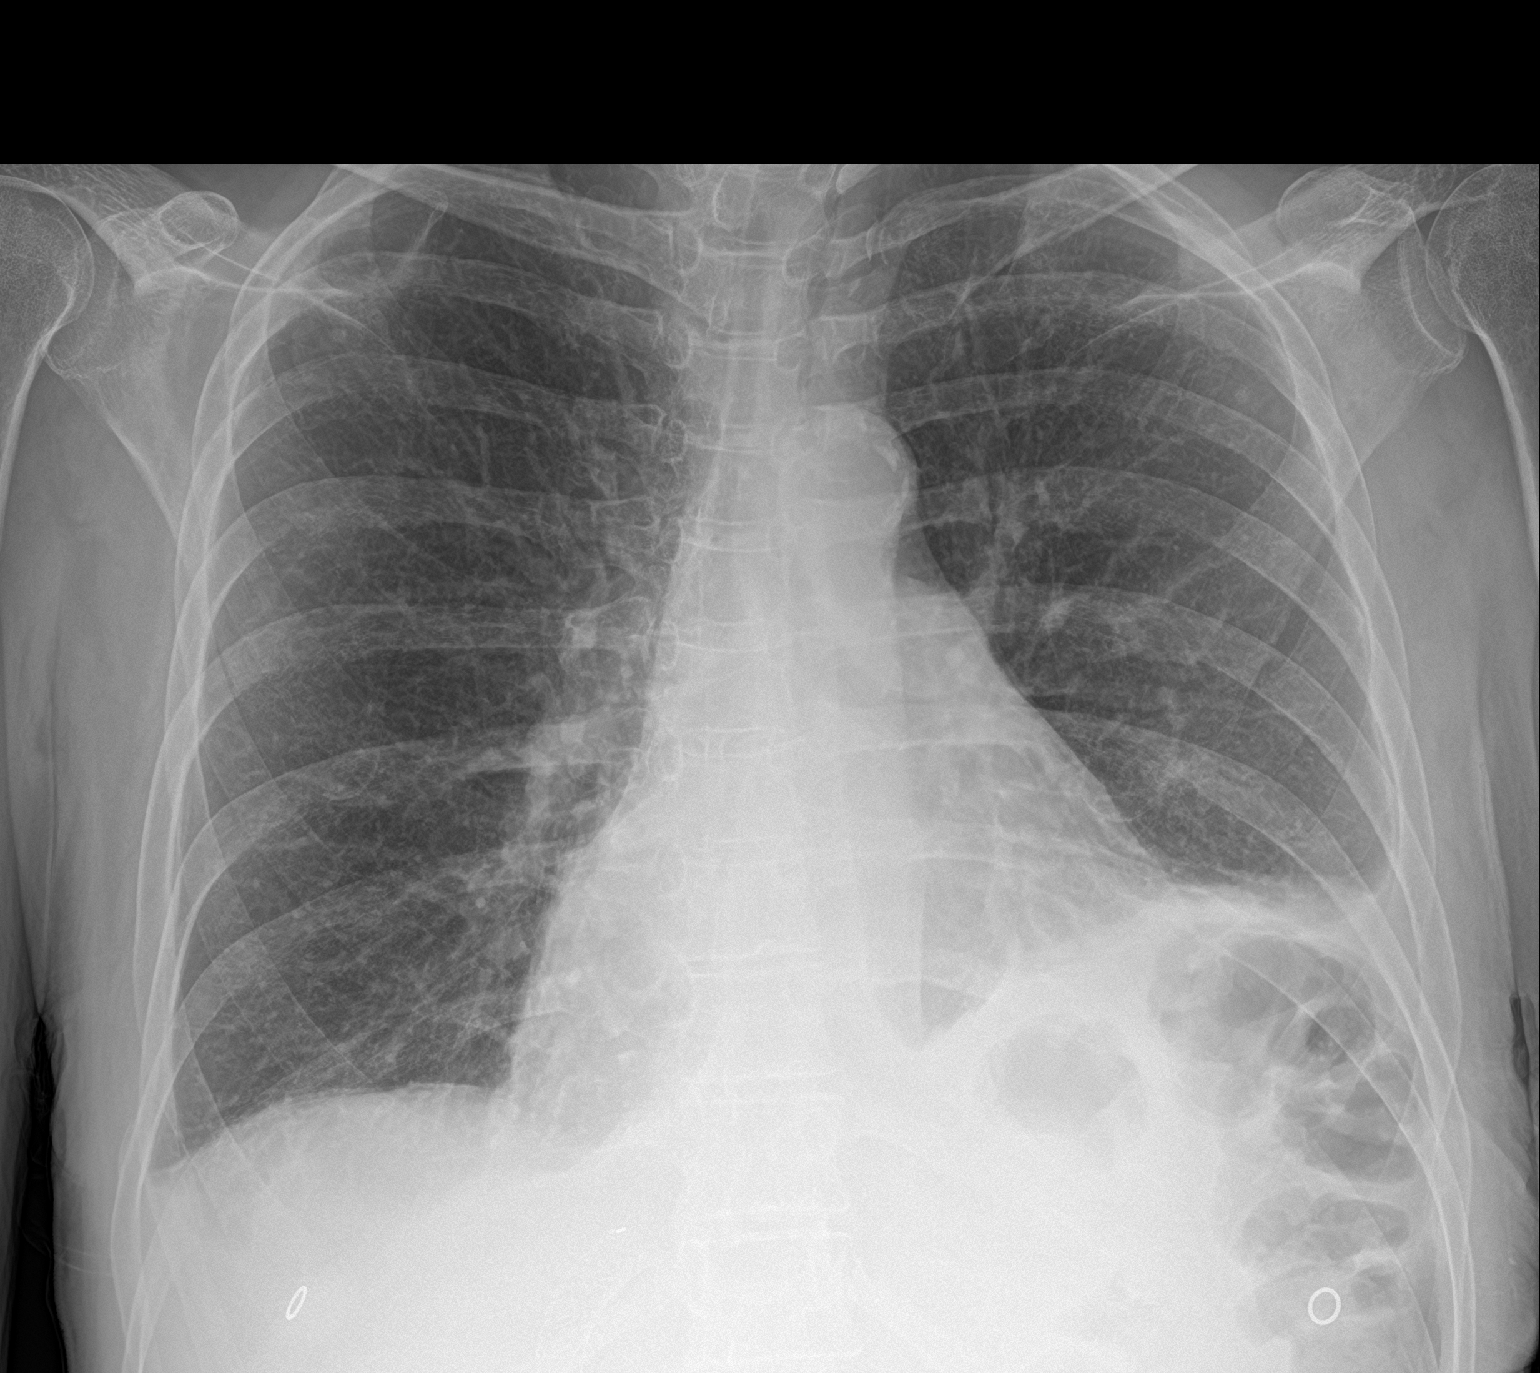

[2 of 2 positions shown; findings below may reference images not displayed]

FINDINGS: Trachea is midline. Cardiomediastinal contours are stable accounting
for partially obscured LEFT heart border due to elevated LEFT
hemidiaphragm and LEFT lower lobe airspace disease. Findings likely
associated with small LEFT effusion though not to the extent that
was seen on the study [DATE].

RIGHT lung is clear.

On limited assessment no acute skeletal process.
IMPRESSION: LEFT lower lobe airspace disease with small LEFT effusion, pleural
fluid diminished since [DATE].

Cardiomegaly as before.

Elevated LEFT hemidiaphragm as before.

## 2020-07-29 IMAGING — CT CT HEAD W/O CM
3 series · 16 of 47 positions shown, 19 images · non-contrast
Comparison: No pertinent prior exams available for comparison.

CLINICAL DATA: Dizziness, nonspecific; headache, classic migraine.
Additional history provided: Patient reports dizziness with fall.

EXAM:
CT HEAD WITHOUT CONTRAST
TECHNIQUE: Contiguous axial images were obtained from the base of the skull
through the vertex without intravenous contrast.

[Series 2: head wo · axial · 0.39mm/px · z∈[+309,+444]mm · 10 of 33 slices shown, 13 images]
[im 3/33  brain]
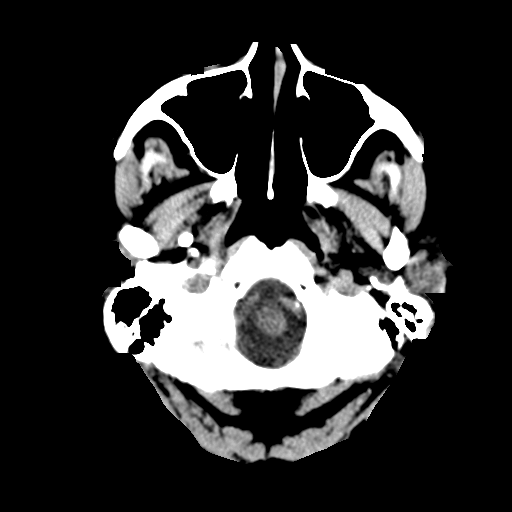
[im 3/33  bone]
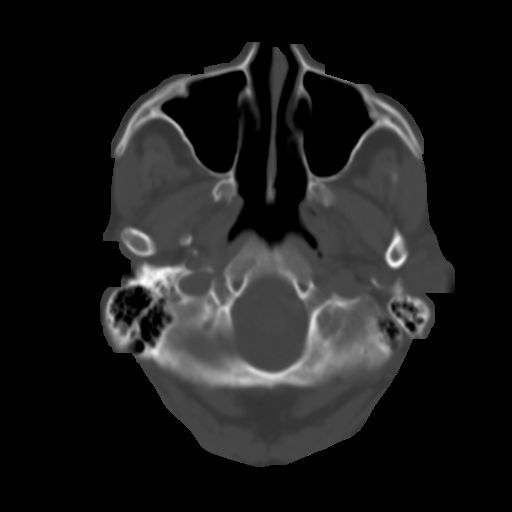
[im 6/33  brain]
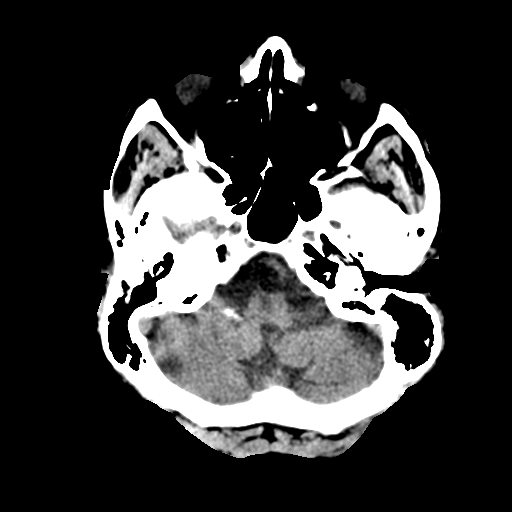
[im 9/33  brain]
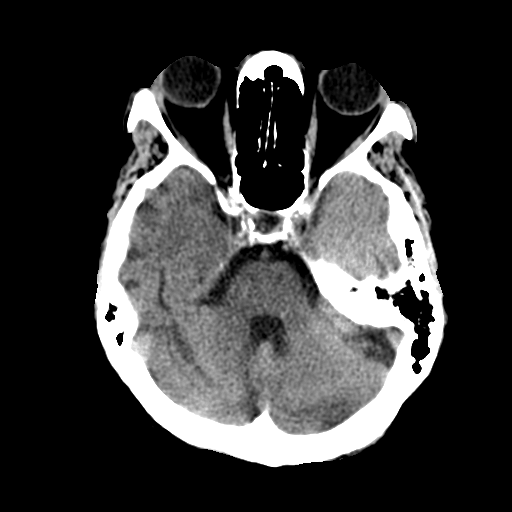
[im 12/33  brain]
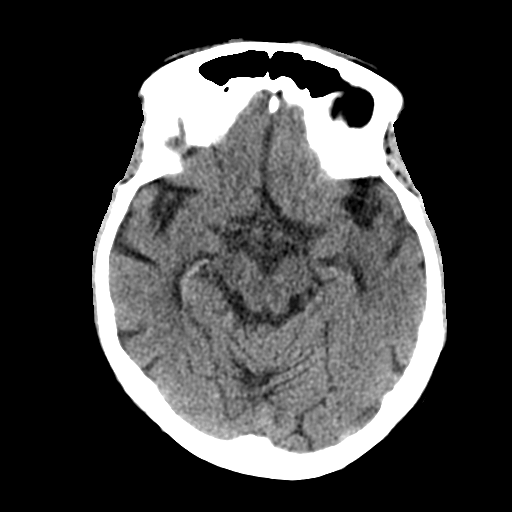
[im 15/33  brain]
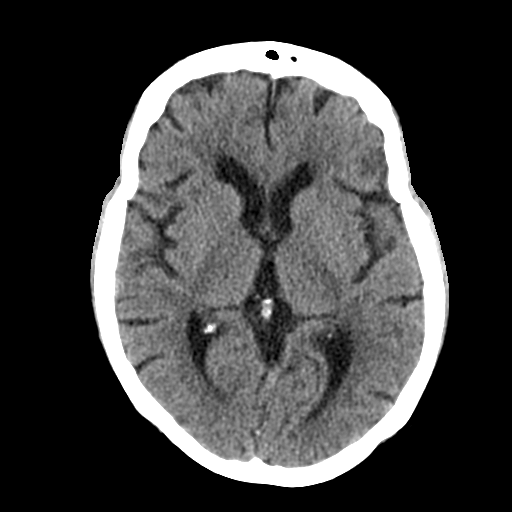
[im 15/33  bone]
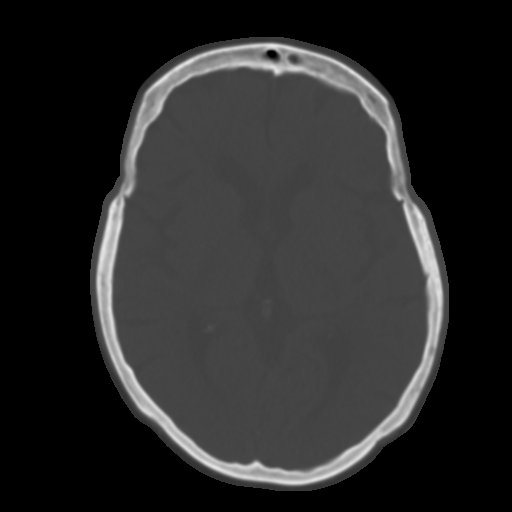
[im 18/33  brain]
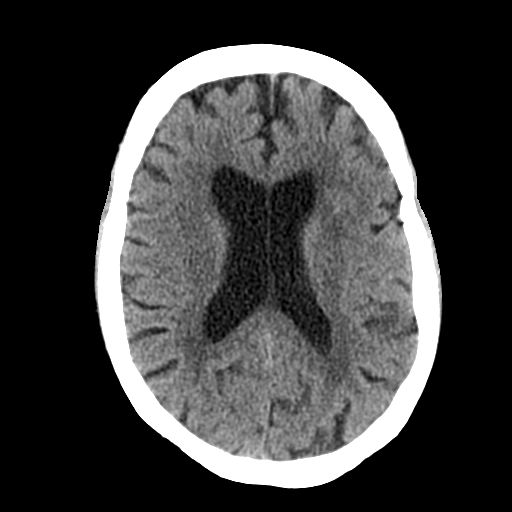
[im 21/33  brain]
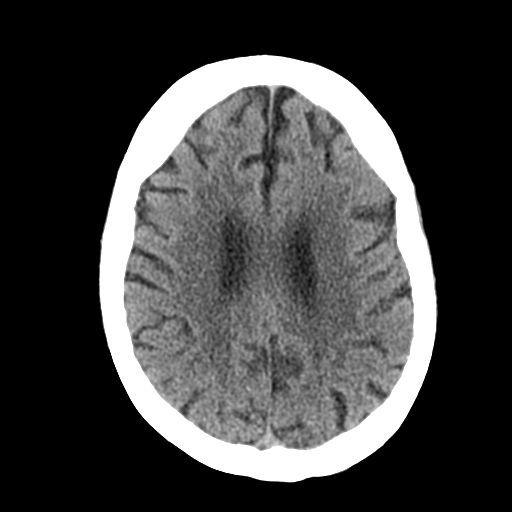
[im 25/33  brain]
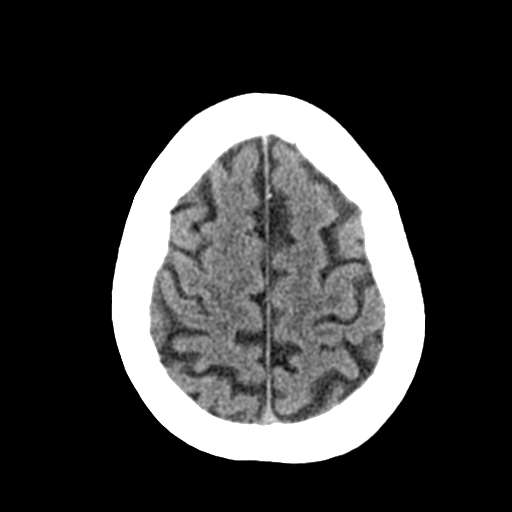
[im 27/33  brain]
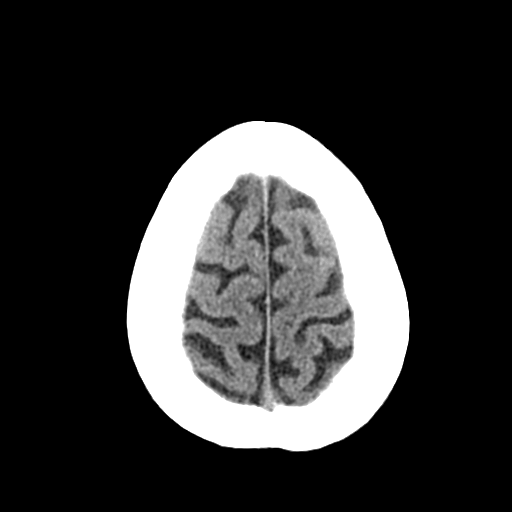
[im 27/33  bone]
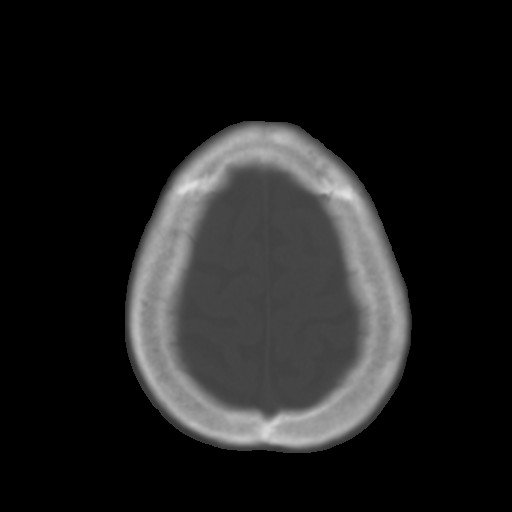
[im 30/33  brain]
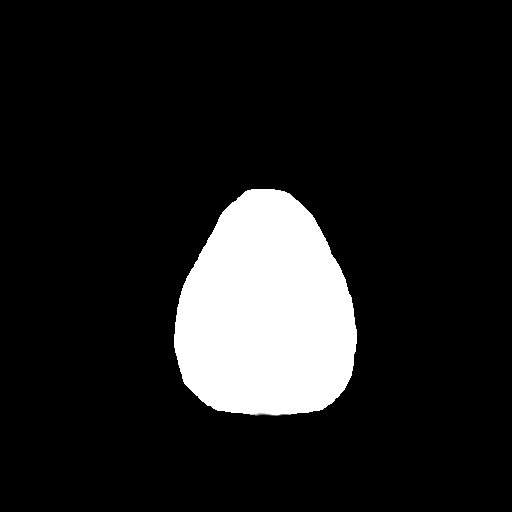

[Series 4: coronal soft tissue · coronal · 0.31mm/px · 3 of 66 slices shown]
[im 22/66  brain]
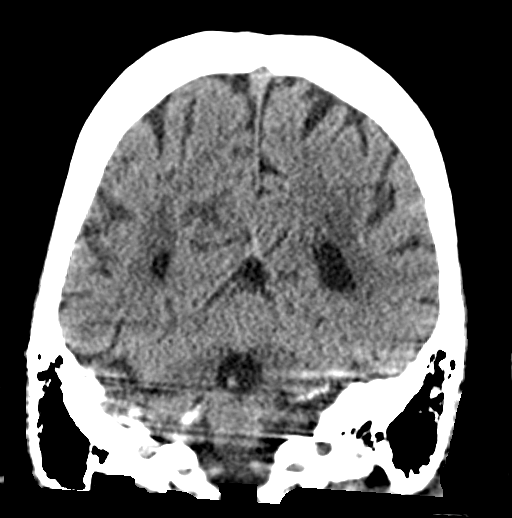
[im 29/66  brain]
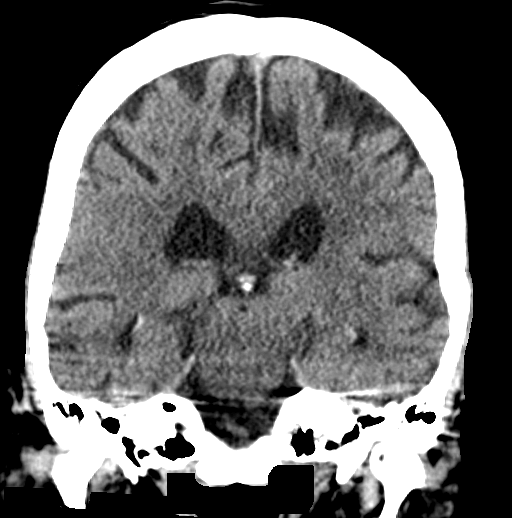
[im 37/66  brain]
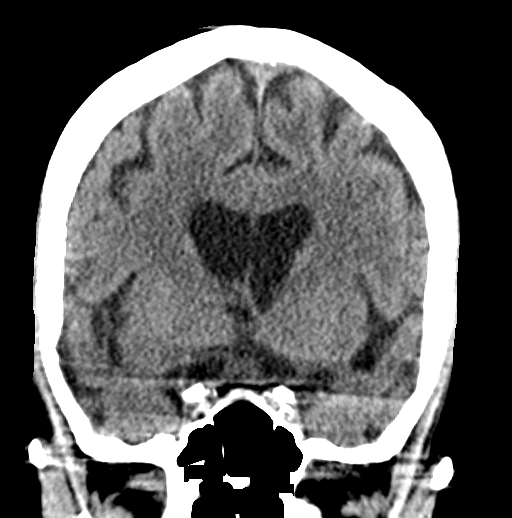

[Series 5: sagittal soft tissue · sagittal · 0.31mm/px · 3 of 56 slices shown]
[im 19/56  brain]
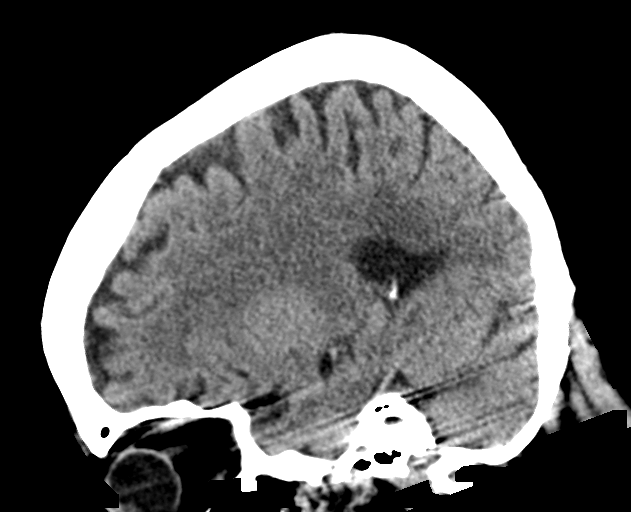
[im 28/56  brain]
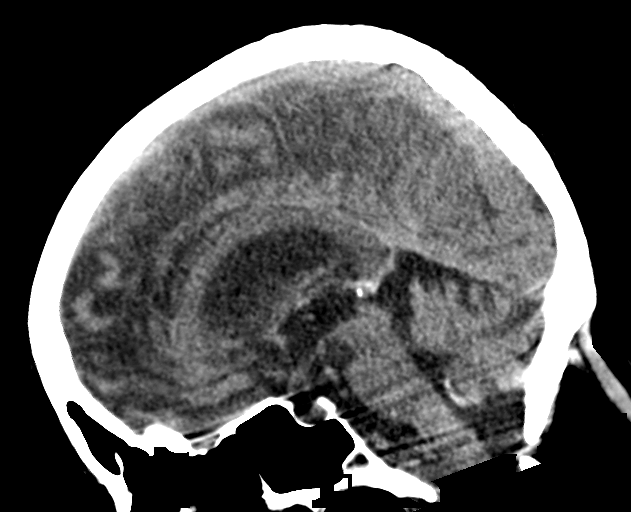
[im 37/56  brain]
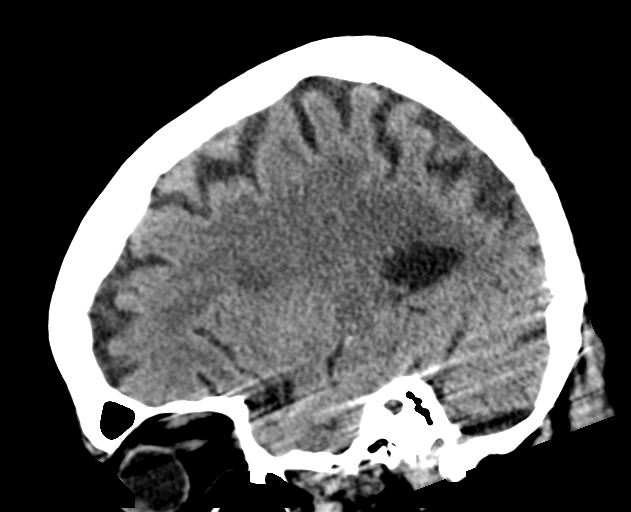

[16 of 47 positions shown; findings below may reference images not displayed]

FINDINGS: Brain:

Mild cerebral and cerebellar atrophy.

Mild ill-defined hypoattenuation within the cerebral white matter is
nonspecific, but compatible with chronic small vessel ischemic
disease.

There is no acute intracranial hemorrhage.

No demarcated cortical infarct.

No extra-axial fluid collection.

No evidence of intracranial mass.

No midline shift.

Vascular: No hyperdense vessel.  Atherosclerotic calcifications.

Skull: No calvarial fracture or focal suspicious osseous lesion.

Sinuses/Orbits: Visualized orbits show no acute finding. Trace
ethmoid and right maxillary sinus mucosal thickening at the imaged
levels.
IMPRESSION: No evidence of acute intracranial abnormality.

Mild atrophy of the brain and chronic small vessel ischemic disease.

## 2020-07-29 IMAGING — CR DG LUMBAR SPINE COMPLETE 4+V
5 series · 5 of 5 positions shown · non-contrast
Comparison: [DATE].

CLINICAL DATA: Low back pain after fall.

EXAM:
LUMBAR SPINE - COMPLETE 4+ VIEW

[l-spine ap]
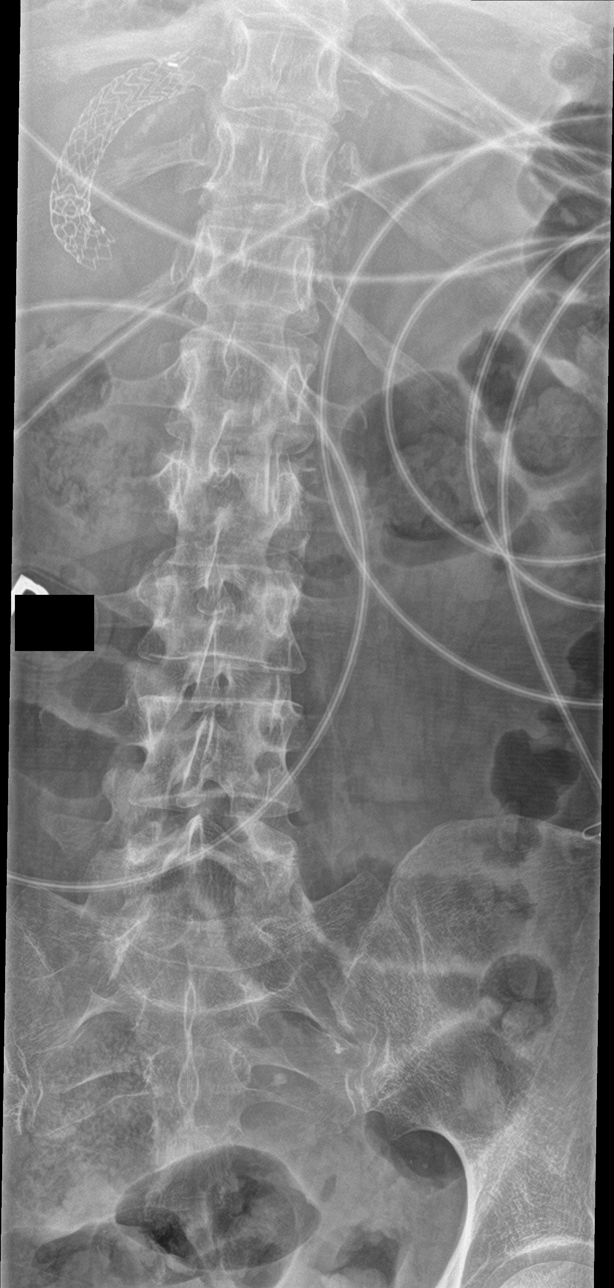

[l-spine obl (1 of 2)]
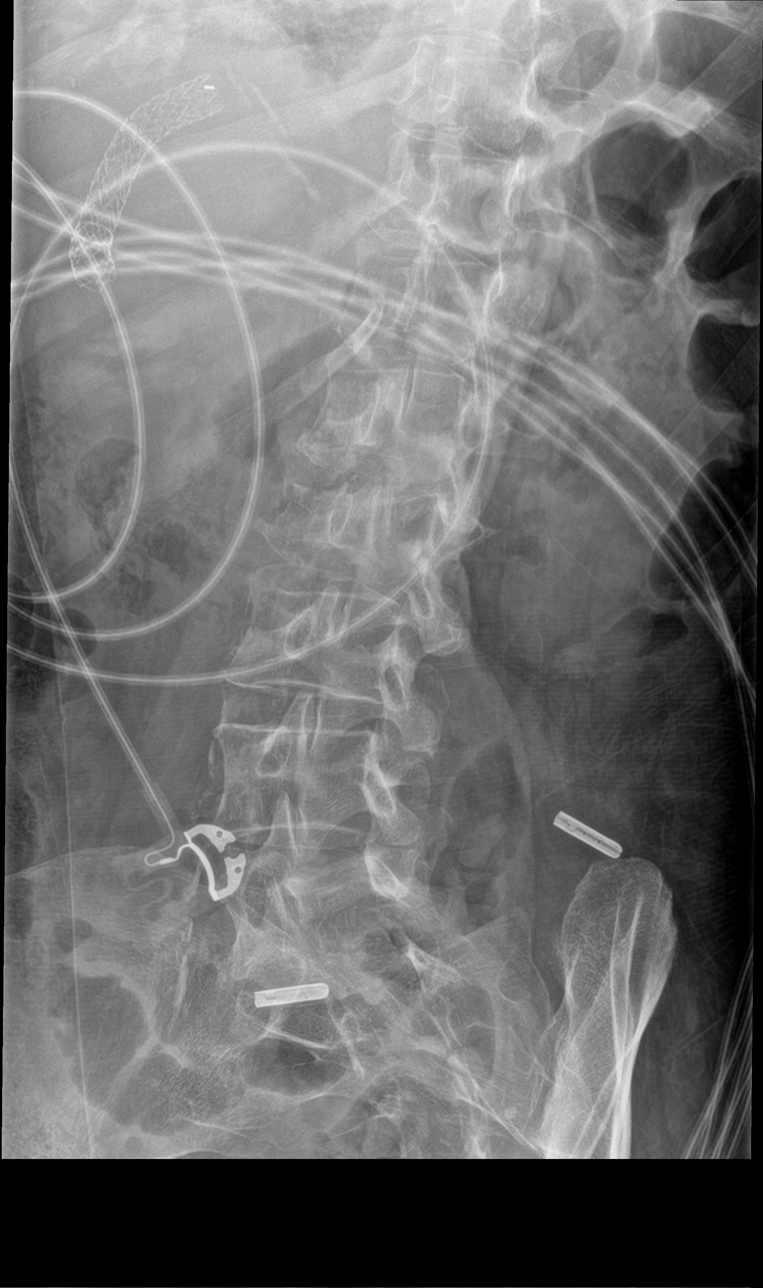

[l-spine obl (2 of 2)]
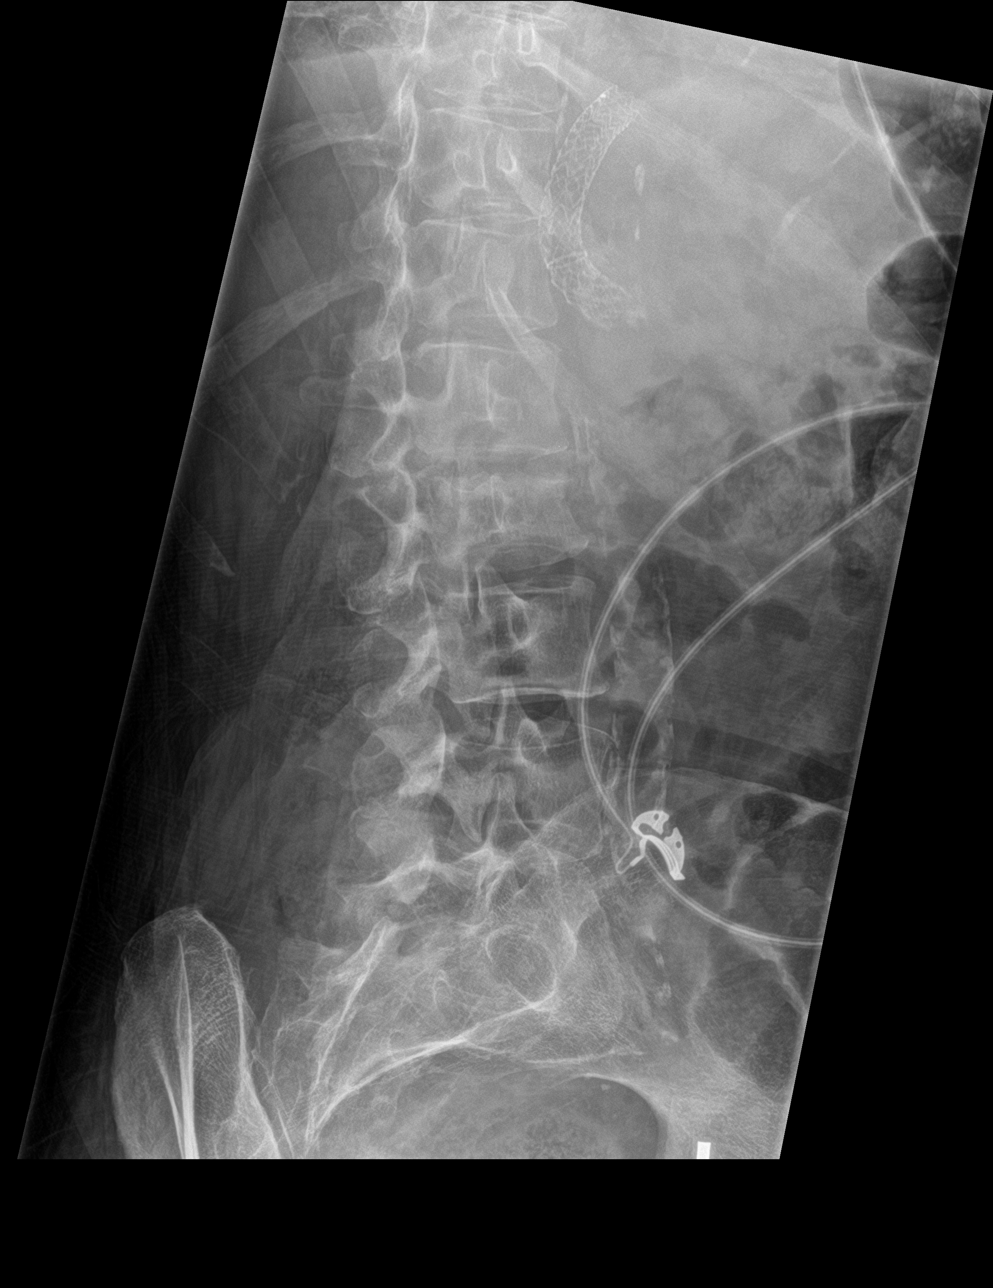

[l-spine lat]
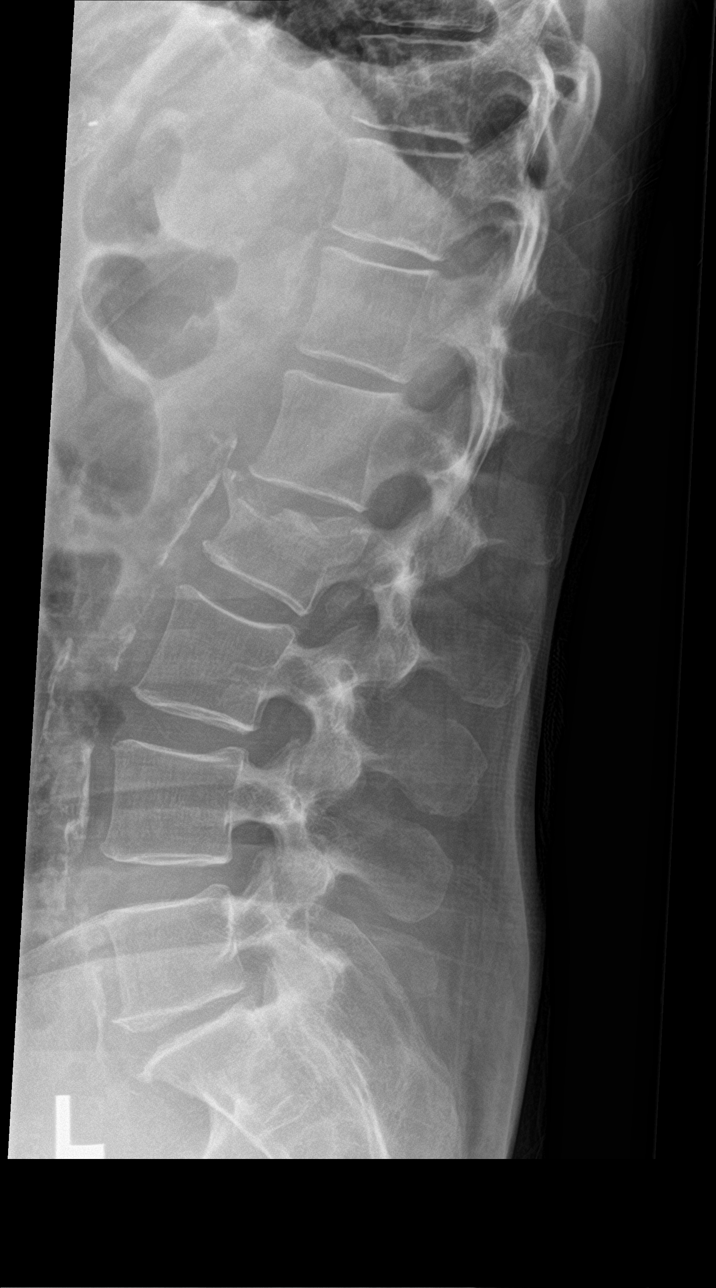

[l-spine spot]
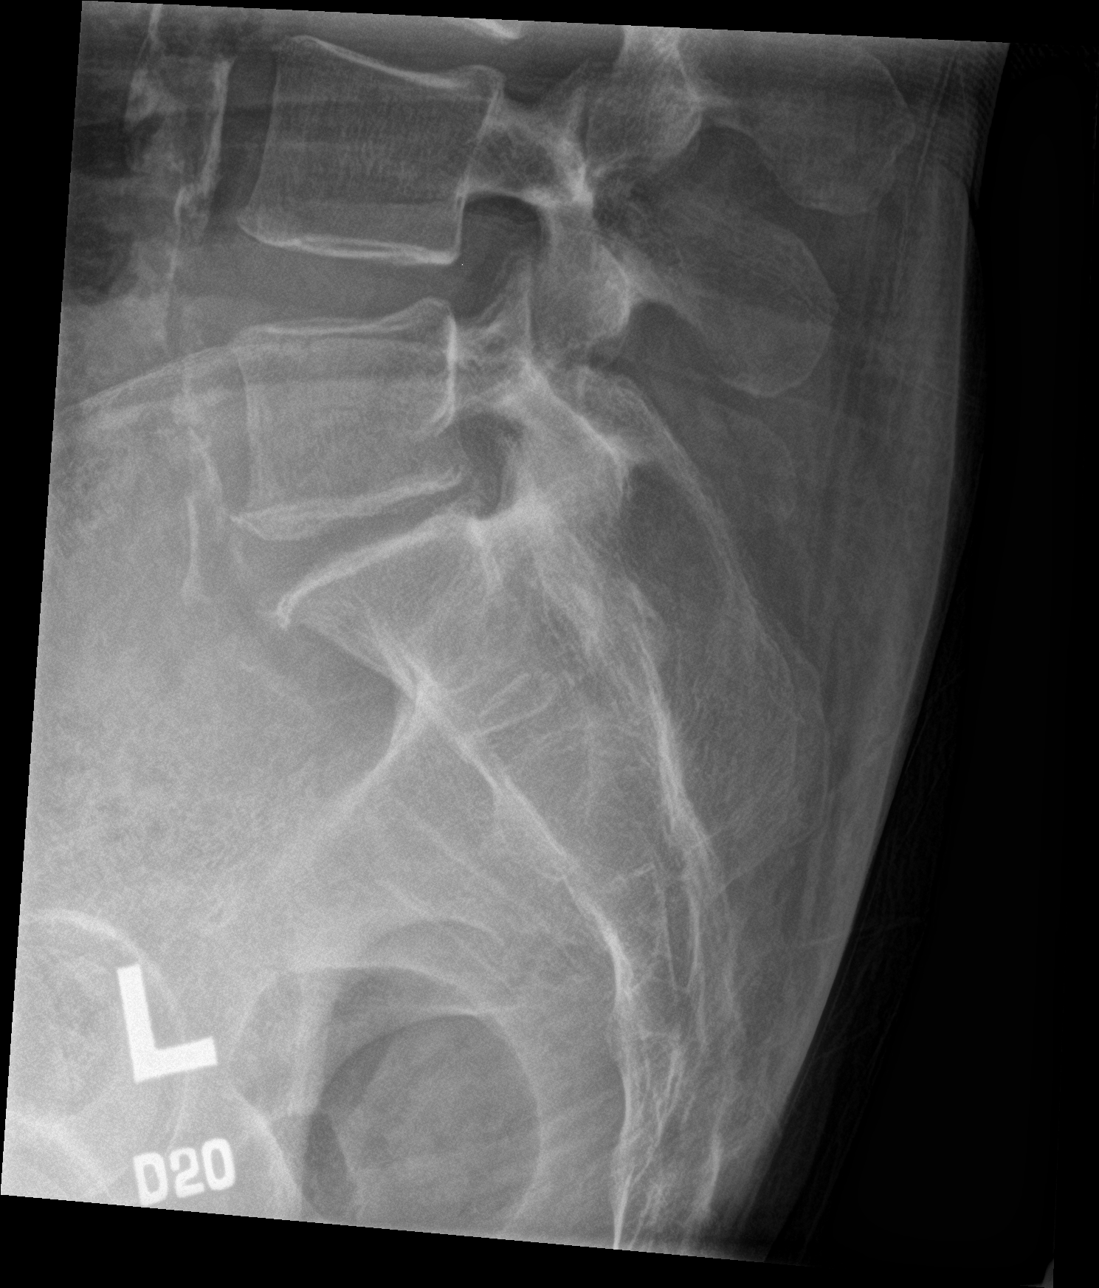

[5 of 5 positions shown; findings below may reference images not displayed]

FINDINGS: There is interval development of moderate compression deformity
involving the L2 vertebral body consistent with acute to subacute
fracture. No spondylolisthesis is noted. Mild degenerative disc
disease is noted at L5-S1 with anterior osteophyte formation.
IMPRESSION: Interval development of moderate compression deformity involving L2
vertebral body consistent with acute to subacute fracture. MRI may
be performed for further evaluation.

## 2020-07-29 MED ORDER — POTASSIUM CHLORIDE 20 MEQ/15ML (10%) PO SOLN
10.0000 meq | Freq: Every day | ORAL | Status: DC
  Filled 2020-07-29 (×3): qty 15

## 2020-07-29 MED ORDER — ONDANSETRON HCL 4 MG/2ML IJ SOLN: 4.0000 mg | Freq: Four times a day (QID) | INTRAMUSCULAR | Status: DC | PRN

## 2020-07-29 MED ORDER — SODIUM CHLORIDE 0.9 % IV SOLN: INTRAVENOUS | Status: DC

## 2020-07-29 MED ORDER — FUROSEMIDE 40 MG PO TABS: 40.0000 mg | ORAL_TABLET | Freq: Every day | ORAL | Status: DC

## 2020-07-29 MED ORDER — POTASSIUM CHLORIDE CRYS ER 10 MEQ PO TBCR
10.0000 meq | EXTENDED_RELEASE_TABLET | Freq: Every day | ORAL | Status: DC
  Filled 2020-07-29 (×2): qty 1

## 2020-07-29 MED ORDER — FLUOXETINE HCL 20 MG PO CAPS
20.0000 mg | ORAL_CAPSULE | Freq: Every day | ORAL | Status: DC
  Administered 2020-07-30: 20 mg
  Filled 2020-07-29: qty 1

## 2020-07-29 MED ORDER — LACTULOSE ENEMA
300.0000 mL | Freq: Once | ORAL | Status: AC
  Administered 2020-07-29: 300 mL via RECTAL
  Filled 2020-07-29: qty 300

## 2020-07-29 MED ORDER — MIDODRINE HCL 5 MG PO TABS
10.0000 mg | ORAL_TABLET | Freq: Three times a day (TID) | ORAL | Status: DC
  Filled 2020-07-29 (×2): qty 2

## 2020-07-29 MED ORDER — LORAZEPAM 2 MG/ML IJ SOLN
2.0000 mg | Freq: Once | INTRAMUSCULAR | Status: AC
  Administered 2020-07-29: 2 mg via INTRAVENOUS
  Filled 2020-07-29: qty 1

## 2020-07-29 MED ORDER — MELATONIN 5 MG PO TABS
5.0000 mg | ORAL_TABLET | Freq: Every evening | ORAL | Status: DC | PRN
  Administered 2020-08-03 – 2020-08-04 (×2): 5 mg
  Filled 2020-07-29 (×2): qty 1

## 2020-07-29 MED ORDER — LACTULOSE 10 GM/15ML PO SOLN: 30.0000 g | Freq: Three times a day (TID) | ORAL | Status: DC

## 2020-07-29 MED ORDER — MELATONIN 5 MG PO TABS: 5.0000 mg | ORAL_TABLET | Freq: Every evening | ORAL | Status: DC | PRN

## 2020-07-29 MED ORDER — LACTULOSE 10 GM/15ML PO SOLN
30.0000 g | Freq: Three times a day (TID) | ORAL | Status: DC
  Administered 2020-07-30: 30 g
  Filled 2020-07-29: qty 60

## 2020-07-29 MED ORDER — LACTULOSE 10 GM/15ML PO SOLN
30.0000 g | Freq: Once | ORAL | Status: DC
  Filled 2020-07-29: qty 60

## 2020-07-29 MED ORDER — FUROSEMIDE 40 MG PO TABS
40.0000 mg | ORAL_TABLET | Freq: Every day | ORAL | Status: DC
  Administered 2020-07-30: 40 mg
  Filled 2020-07-29: qty 1

## 2020-07-29 MED ORDER — GABAPENTIN 400 MG PO CAPS
400.0000 mg | ORAL_CAPSULE | Freq: Three times a day (TID) | ORAL | Status: DC
  Administered 2020-07-30: 400 mg
  Filled 2020-07-29: qty 1

## 2020-07-29 MED ORDER — PANTOPRAZOLE SODIUM 20 MG PO TBEC
20.0000 mg | DELAYED_RELEASE_TABLET | ORAL | Status: DC
  Filled 2020-07-29: qty 1

## 2020-07-29 MED ORDER — ONDANSETRON HCL 4 MG PO TABS: 4.0000 mg | ORAL_TABLET | Freq: Four times a day (QID) | ORAL | Status: DC | PRN

## 2020-07-29 MED ORDER — PANTOPRAZOLE SODIUM 40 MG PO PACK
20.0000 mg | PACK | Freq: Every day | ORAL | Status: DC
  Administered 2020-07-30: 20 mg
  Filled 2020-07-29 (×2): qty 20

## 2020-07-29 MED ORDER — ENOXAPARIN SODIUM 40 MG/0.4ML ~~LOC~~ SOLN
40.0000 mg | SUBCUTANEOUS | Status: DC
  Administered 2020-07-29 – 2020-08-05 (×8): 40 mg via SUBCUTANEOUS
  Filled 2020-07-29 (×8): qty 0.4

## 2020-07-29 MED ORDER — MIDODRINE HCL 5 MG PO TABS
10.0000 mg | ORAL_TABLET | Freq: Three times a day (TID) | ORAL | Status: DC
  Administered 2020-07-30: 10 mg
  Filled 2020-07-29 (×4): qty 2

## 2020-07-29 MED ORDER — FLUOXETINE HCL 20 MG PO CAPS: 20.0000 mg | ORAL_CAPSULE | Freq: Every day | ORAL | Status: DC

## 2020-07-29 MED ORDER — LACTULOSE ENEMA
300.0000 mL | Freq: Two times a day (BID) | ORAL | Status: DC
  Filled 2020-07-29: qty 300

## 2020-07-29 MED ORDER — GABAPENTIN 300 MG PO CAPS: 400.0000 mg | ORAL_CAPSULE | Freq: Three times a day (TID) | ORAL | Status: DC

## 2020-07-29 MED ORDER — DIGOXIN 125 MCG PO TABS
125.0000 ug | ORAL_TABLET | Freq: Every day | ORAL | Status: DC
  Filled 2020-07-29 (×2): qty 1

## 2020-07-29 MED ORDER — DIGOXIN 125 MCG PO TABS
125.0000 ug | ORAL_TABLET | Freq: Every day | ORAL | Status: DC
  Administered 2020-07-30: 125 ug
  Filled 2020-07-29 (×2): qty 1

## 2020-07-29 NOTE — H&P (Signed)
History and Physical    Anita Reed:[id number] DOB: 07-14-1961 DOA: 07/29/2020  PCP: Towanda Malkin, MD   Patient coming from: Home  I have personally briefly reviewed patient's old medical records in [place]  Chief Complaint: Confusion  HPI: Anita Reed is a 59 y.o. female with medical history significant for alcoholic cirrhosis, with esophageal varices, paroxysmal A. fib not on anticoagulation, chronic hypotension on midodrine, history of GI bleed, depression and chronic pain, who was brought to the emergency room due to concern for confusion at home. Most of the history taken from patient's husband and ER report due to confusion. Patient has been using lactulose as prescribed with frequent bowel movements daily. The confusion has led to a couple falls and she complains of back pain and headache. She has had no nausea, vomiting, abdominal pain or dysuria. Denies cough, fever chills or shortness of breath ED Course: On arrival she was afebrile, soft blood pressure 106/64, tachycardic at 107, respirations 18 with O2 sat 100% on room air. Blood work revealed elevated white cell count of 14,000, normal platelets of 170 8K, BMP with no significant abnormalities, LFTs not done. Ammonia 46. Troponin was elevated at 32 then 38. Procalcitonin less than 0.1. Covid and flu test negative. EKG as reviewed by me : Sinus tach at 106 with nonspecific ST-T wave changes X-ray LS spine moderate compression fracture L2, consistent with subacute fracture can consider MRI. CT head no acute intracranial process Chest x-ray: Left lower airspace disease with a small left effusion with pleural fluid diminished since October 2021 Hospitalist consulted for admission. Following admission, patient noted to be choking on lactulose and failed swallow study so NG tube placed.  Review of Systems: Unreliable due to lethargy.  Patient did answer questions but very slowly.  Systems review and denies  other complaints   Past Medical History:  Diagnosis Date  . Anemia   . Atrial flutter ([place])   . Cancer [facility])    lymphoma 11 yrs. ago  . Cirrhosis ([place])   . Dysrhythmia   . Esophageal varices ([place])   . GERD (gastroesophageal reflux disease)   . Hepatitis    hep C-resolved  . History of hiatal hernia   . Hypertension   . Pneumonia   . Portal hypertension ([facility])     Past Surgical History:  Procedure Laterality Date  . ABDOMINAL HYSTERECTOMY    . APPENDECTOMY    . COLONOSCOPY N/A 06/03/2019   Procedure: COLONOSCOPY;  Surgeon: Toledo, Benay Pike, MD;  Location: [facility] ENDOSCOPY;  Service: Gastroenterology;  Laterality: N/A;  . EPIGASTRIC HERNIA REPAIR N/A 03/21/2020   Procedure: HERNIA REPAIR EPIGASTRIC ADULT, Open;  Surgeon: [name], MD;  Location: [facility] ORS;  Service: General;  Laterality: N/A;  . ESOPHAGOGASTRODUODENOSCOPY N/A 01/29/2019   Procedure: ESOPHAGOGASTRODUODENOSCOPY (EGD);  Surgeon: [name], MD;  Location: [facility] ENDOSCOPY;  Service: Gastroenterology;  Laterality: N/A;  . ESOPHAGOGASTRODUODENOSCOPY N/A 06/01/2019   Procedure: ESOPHAGOGASTRODUODENOSCOPY (EGD);  Surgeon: Toledo, Benay Pike, MD;  Location: [facility] ENDOSCOPY;  Service: Gastroenterology;  Laterality: N/A;  . IR PARACENTESIS  08/27/2019  . IR RADIOLOGIST EVAL & MGMT  08/08/2019  . IR RADIOLOGIST EVAL & MGMT  11/14/2019  . IR TIPS  08/27/2019  . RADIOLOGY WITH ANESTHESIA N/A 08/27/2019   Procedure: IR WITH ANESTHESIA TIPS PROCEDURE;  Surgeon: [name], MD;  Location: [place];  Service: Radiology;  Laterality: N/A;  . TONSILLECTOMY       reports that she has been smoking cigarettes. She  has a 35.00 pack-year smoking history. She has never used smokeless tobacco. She reports current alcohol use. She reports that she does not use drugs.  No Known Allergies  Family History  Problem Relation Age of Onset  . Cancer Mother   . Cancer Father        Prior to Admission medications   Medication  Sig Start Date End Date Taking? Authorizing Provider  digoxin (LANOXIN) 0.125 MG tablet Take 1 tablet by mouth once daily 05/22/20  Yes Gollan, Kathlene November, MD  FLUoxetine (PROZAC) 20 MG tablet Take 1 tablet by mouth once daily 07/08/20  Yes Towanda Malkin, MD  furosemide (LASIX) 40 MG tablet Take 1 tablet (40 mg total) by mouth daily. 06/18/20  Yes Minna Merritts, MD  gabapentin (NEURONTIN) 400 MG capsule Take 1 capsule (400 mg total) by mouth 3 (three) times daily. 05/29/20  Yes Towanda Malkin, MD  lactulose (CHRONULAC) 10 GM/15ML solution Take 45 mLs (30 g total) by mouth 3 (three) times daily. 08/28/19  Yes Rushie Nyhan C, NP  melatonin 5 MG TABS Take 5 mg by mouth at bedtime as needed.   Yes [provider]  midodrine (PROAMATINE) 10 MG tablet Take 1 tablet (10 mg total) by mouth 3 (three) times daily with meals. 06/18/20  Yes Gollan, Kathlene November, MD  pantoprazole (PROTONIX) 20 MG tablet Take 1 tablet (20 mg total) by mouth every morning. One hour before breakfast 02/26/20  Yes Delsa Grana, PA-C  potassium chloride (KLOR-CON) 10 MEQ tablet Take 1 tablet (10 mEq total) by mouth daily. 06/18/20  Yes Minna Merritts, MD  tiZANidine (ZANAFLEX) 4 MG capsule TAKE 1 CAPSULE BY MOUTH THREE TIMES DAILY AS NEEDED FOR MUSCLE SPASM 05/29/20  Yes Towanda Malkin, MD    Physical Exam: Vitals:   07/29/20 1730 07/29/20 1800 07/29/20 1850 07/29/20 1930  BP: 118/64 117/75 128/77 128/79  Pulse: (!) 106 (!) 105 (!) 111 (!) 111  Resp: (!) 31 (!) 24 (!) 32 (!) 32  Temp:      SpO2: 98% 98% 95% 96%  Weight:      Height:         Vitals:   07/29/20 1730 07/29/20 1800 07/29/20 1850 07/29/20 1930  BP: 118/64 117/75 128/77 128/79  Pulse: (!) 106 (!) 105 (!) 111 (!) 111  Resp: (!) 31 (!) 24 (!) 32 (!) 32  Temp:      SpO2: 98% 98% 95% 96%  Weight:      Height:          Constitutional:  Icteric, restless in bed but answering questions asked though slow to respond  oriented x2  . Not in any apparent distress HEENT:      Head: Normocephalic and atraumatic.         Eyes: PERLA, EOMI, Conjunctivae are normal. Sclera is icteric.       Mouth/Throat: Mucous membranes are moist.       Neck: Supple with no signs of meningismus. Cardiovascular: Regular rate and rhythm. No murmurs, gallops, or rubs. 2+ symmetrical distal pulses are present . No JVD. No LE edema Respiratory: Respiratory effort normal .Lungs sounds clear bilaterally. No wheezes, crackles, or rhonchi.  Gastrointestinal: Soft, non tender, and non distended with positive bowel sounds. No rebound or guarding. Genitourinary: No CVA tenderness. Musculoskeletal: Nontender with normal range of motion in all extremities. No cyanosis, or erythema of extremities. Neurologic:  Face is symmetric. Moving all extremities. No gross focal neurologic deficits .  Skin: Skin is warm, dry.  No rash or ulcers Psychiatric: Mood and affect are normal    Labs on Admission: I have personally reviewed following labs and imaging studies  CBC: Recent Labs  Lab 07/29/20 1541  WBC 14.2*  HGB 14.4  HCT 41.7  MCV 95.6  PLT 161   Basic Metabolic Panel: Recent Labs  Lab 07/29/20 1541  NA 145  K 3.4*  CL 106  CO2 26  GLUCOSE 109*  BUN 14  CREATININE 0.54  CALCIUM 9.0   GFR: Estimated Creatinine Clearance: 68.6 mL/min (by C-G formula based on SCr of 0.54 mg/dL). Liver Function Tests: No results for input(s): AST, ALT, ALKPHOS, BILITOT, PROT, ALBUMIN in the last 168 hours. No results for input(s): LIPASE, AMYLASE in the last 168 hours. Recent Labs  Lab 07/29/20 1542  AMMONIA 46*   Coagulation Profile: No results for input(s): INR, PROTIME in the last 168 hours. Cardiac Enzymes: No results for input(s): CKTOTAL, CKMB, CKMBINDEX, TROPONINI in the last 168 hours. BNP (last 3 results) No results for input(s): PROBNP in the last 8760 hours. HbA1C: No results for input(s): HGBA1C in the last 72  hours. CBG: No results for input(s): GLUCAP in the last 168 hours. Lipid Profile: No results for input(s): CHOL, HDL, LDLCALC, TRIG, CHOLHDL, LDLDIRECT in the last 72 hours. Thyroid Function Tests: No results for input(s): TSH, T4TOTAL, FREET4, T3FREE, THYROIDAB in the last 72 hours. Anemia Panel: No results for input(s): VITAMINB12, FOLATE, FERRITIN, TIBC, IRON, RETICCTPCT in the last 72 hours. Urine analysis:    Component Value Date/Time   COLORURINE YELLOW (A) 07/14/2019 2110   APPEARANCEUR CLEAR (A) 07/14/2019 2110   LABSPEC >1.046 (H) 07/14/2019 2110   PHURINE 6.0 07/14/2019 2110   [place] NEGATIVE 07/14/2019 2110   [place] NEGATIVE 07/14/2019 2110   [place] NEGATIVE 07/14/2019 2110   [place] NEGATIVE 07/14/2019 2110   PROTEINUR NEGATIVE 07/14/2019 2110   NITRITE NEGATIVE 07/14/2019 2110   LEUKOCYTESUR NEGATIVE 07/14/2019 2110    Radiological Exams on Admission: DG Chest 2 View  Result Date: 07/29/2020 CLINICAL DATA:  Chest pain, dizziness and shortness of breath EXAM: CHEST - 2 VIEW COMPARISON:  June 02, 2020 FINDINGS: Trachea is midline. Cardiomediastinal contours are stable accounting for partially obscured LEFT heart border due to elevated LEFT hemidiaphragm and LEFT lower lobe airspace disease. Findings likely associated with small LEFT effusion though not to the extent that was seen on the study of June 02, 2020. RIGHT lung is clear. On limited assessment no acute skeletal process. IMPRESSION: LEFT lower lobe airspace disease with small LEFT effusion, pleural fluid diminished since October of 2021. Cardiomegaly as before. Elevated LEFT hemidiaphragm as before. Electronically Signed   By: Zetta Bills M.D.   On: 07/29/2020 16:24   DG Lumbar Spine Complete  Result Date: 07/29/2020 CLINICAL DATA:  Low back pain after fall. EXAM: LUMBAR SPINE - COMPLETE 4+ VIEW COMPARISON:  May 30, 2020. FINDINGS: There is interval development of moderate compression deformity  involving the L2 vertebral body consistent with acute to subacute fracture. No spondylolisthesis is noted. Mild degenerative disc disease is noted at L5-S1 with anterior osteophyte formation. IMPRESSION: Interval development of moderate compression deformity involving L2 vertebral body consistent with acute to subacute fracture. MRI may be performed for further evaluation. Electronically Signed   By: Marijo Conception M.D.   On: 07/29/2020 19:18   CT Head Wo Contrast  Result Date: 07/29/2020 CLINICAL DATA:  Dizziness, nonspecific; headache, classic migraine. Additional history provided: Patient  reports dizziness with fall. EXAM: CT HEAD WITHOUT CONTRAST TECHNIQUE: Contiguous axial images were obtained from the base of the skull through the vertex without intravenous contrast. COMPARISON:  No pertinent prior exams available for comparison. FINDINGS: Brain: Mild cerebral and cerebellar atrophy. Mild ill-defined hypoattenuation within the cerebral white matter is nonspecific, but compatible with chronic small vessel ischemic disease. There is no acute intracranial hemorrhage. No demarcated cortical infarct. No extra-axial fluid collection. No evidence of intracranial mass. No midline shift. Vascular: No hyperdense vessel.  Atherosclerotic calcifications. Skull: No calvarial fracture or focal suspicious osseous lesion. Sinuses/Orbits: Visualized orbits show no acute finding. Trace ethmoid and right maxillary sinus mucosal thickening at the imaged levels. IMPRESSION: No evidence of acute intracranial abnormality. Mild atrophy of the brain and chronic small vessel ischemic disease. Electronically Signed   By: Kellie Simmering DO   On: 07/29/2020 16:44     Assessment/Plan 59 year old female with history of alcoholic cirrhosis, with esophageal varices, paroxysmal A. fib not on anticoagulation, chronic hypotension on midodrine, history of GI bleed, depression and chronic pain, who was brought to the emergency room due to  concern for confusion at home.     Acute hepatic encephalopathy   Alcoholic cirrhosis of liver with ascites ([place]) -Patient presented with 2-day history of confusion leading to falls but without stigmata of acute  infection.  Restless  -Ammonia mildly elevated at 48.Addendum: LFTs return with bili 5.7 -Continue lactulose, Lasix and spironolactone -Continue midodrine -Neurochecks, fall and aspiration precautions  Dysphagia  -Addendum post admission;, patient noted to have difficulty swallowing lactulose and failed swallow screen -Patient says she has had this swallowing problem for several weeks -Neuro reexamination otherwise nonfocal -EGD in October 2020 showed mild portal hypertensive gastropathy and grade 2 varices in the middlethird and lower third of the esophagus -Speech therapy eval and consider GI evaluation if needed -Keep n.p.o.  Hold off on NG tube for now.  --lactulose enema, and Hold medicines for tonight pending swallow eval in the a.m. -Gentle IV fluids    Falls, initial encounter   Traumatic compression fracture of L1 lumbar vertebra, subacute ([place]) -Patient had a couple falls at home related to her confusion with chest x-ray showing subacute L1 vertebral compression fracture -No evidence of lower extremity neurologic deficit -Pain control -Consider MRI versus Ortho consult pending PT evaluation -   Leucocytosis -Chest x-ray suggesting airspace disease on the left. -Procalcitonin less than 0.1. Covid and flu test negative -Low suspicion for pneumonia -No abdominal pain so low suspicion for SBP -Continue to monitor -We will get a UA  Elevated troponin -Troponin 46 >38. Probably demand ischemia -No chest pain and EKG is nonacute -Continue to monitor  Paroxysmal atrial fibrillation ([place]) -Patient currently in sinus rhythm -Continue digoxin. Not currently on systemic anticoagulation -Not currently on aspirin reason uncertain    History of GI bleed -Hemoglobin  at 13. No reports of bleeding -Platelets were good at 178K    Underweight -BMI 18. Consider nutritionist consult   Elevated troponin    DVT prophylaxis: Lovenox  Code Status: full code  Family Communication:  none  Disposition Plan: Back to previous home environment Consults called: none  Status:At the time of admission, it appears that the appropriate admission status for this patient is INPATIENT. This is judged to be reasonable and necessary in order to provide the required intensity of service to ensure the patient's safety given the presenting symptoms, physical exam findings, and initial radiographic and laboratory data in the context of  their  Comorbid conditions.   Patient requires inpatient status due to high intensity of service, high risk for further deterioration and high frequency of surveillance required.   I certify that at the point of admission it is my clinical judgment that the patient will require inpatient hospital care spanning beyond [place] MD [facility]     07/29/2020, 8:18 PM

## 2020-07-29 NOTE — ED Provider Notes (Addendum)
[facility] Emergency Department Provider Note  ____________________________________________   I have reviewed the triage vital signs and the nursing notes.   HISTORY  Chief Complaint Dizziness and Fall   History limited by and level 5 caveat due to: Confusion  HPI Anita Reed is a 59 y.o. female who presents to the emergency department today because of concerns for increased confusion, dizziness and falls.  Patient states that her husband has noticed that she has been increasingly confused and she herself has not felt her normal self for the past roughly 2 weeks.  Patient does have a hard time giving a good history because of the confusion.  She states that along with the confusion she has felt increased dizziness and difficulty with ambulation.  This has resulted in falls.  She is unsure if she hit her head on one of the falls but thinks that she might have.  Additionally patient is complaining of some low back pain after a fall.  Patient states that she has been taking her lactulose.  She states she is having 12-13 bowel movements a day. She denies any fevers.    Records reviewed. Per medical record review patient has a history of cirrhosis  Past Medical History:  Diagnosis Date  . Anemia   . Atrial flutter ([place])   . Cancer [facility])    lymphoma 11 yrs. ago  . Cirrhosis ([place])   . Dysrhythmia   . Esophageal varices ([place])   . GERD (gastroesophageal reflux disease)   . Hepatitis    hep C-resolved  . History of hiatal hernia   . Hypertension   . Pneumonia   . Portal hypertension [facility])     Patient Active Problem List   Diagnosis Date Noted  . Septic shock ([place]) 05/31/2020  . Healthcare-associated pneumonia   . Cirrhosis of liver without ascites ([place])   . Epigastric hernia 03/06/2020  . Major depressive disorder with single episode, in partial remission ([place]) 11/28/2019  . Neuropathy 11/28/2019  . Dysthymia 07/27/2019  . Other chronic pain 07/27/2019   . Muscle spasticity 07/27/2019  . C. difficile colitis   . Hypokalemia 07/15/2019  . Bandemia 07/15/2019  . Hypotension   . Goals of care, counseling/discussion   . Palliative care encounter   . Muscle cramping 06/22/2019  . Alcohol abuse 06/22/2019  . Sepsis ([place]) 06/22/2019  . GIB (gastrointestinal bleeding) 06/01/2019  . GI bleeding 06/01/2019  . History of lymphoma 02/26/2019  . Atrial flutter ([place]) 02/07/2019  . Alcoholic cirrhosis of liver with ascites ([place]) 02/07/2019  . Persistent atrial fibrillation ([place]) 02/07/2019  . Anemia 02/07/2019  . Anorexia 02/07/2019  . GI bleed 01/28/2019  . Ascites 01/18/2019  . Cancer [facility])     Past Surgical History:  Procedure Laterality Date  . ABDOMINAL HYSTERECTOMY    . APPENDECTOMY    . COLONOSCOPY N/A 06/03/2019   Procedure: COLONOSCOPY;  Surgeon: Toledo, Benay Pike, MD;  Location: [facility] ENDOSCOPY;  Service: Gastroenterology;  Laterality: N/A;  . EPIGASTRIC HERNIA REPAIR N/A 03/21/2020   Procedure: HERNIA REPAIR EPIGASTRIC ADULT, Open;  Surgeon: [name], MD;  Location: [facility] ORS;  Service: General;  Laterality: N/A;  . ESOPHAGOGASTRODUODENOSCOPY N/A 01/29/2019   Procedure: ESOPHAGOGASTRODUODENOSCOPY (EGD);  Surgeon: [name], MD;  Location: [facility] ENDOSCOPY;  Service: Gastroenterology;  Laterality: N/A;  . ESOPHAGOGASTRODUODENOSCOPY N/A 06/01/2019   Procedure: ESOPHAGOGASTRODUODENOSCOPY (EGD);  Surgeon: Toledo, Benay Pike, MD;  Location: [facility] ENDOSCOPY;  Service: Gastroenterology;  Laterality: N/A;  . IR PARACENTESIS  08/27/2019  .  IR RADIOLOGIST EVAL & MGMT  08/08/2019  . IR RADIOLOGIST EVAL & MGMT  11/14/2019  . IR TIPS  08/27/2019  . RADIOLOGY WITH ANESTHESIA N/A 08/27/2019   Procedure: IR WITH ANESTHESIA TIPS PROCEDURE;  Surgeon: [name], MD;  Location: [place];  Service: Radiology;  Laterality: N/A;  . TONSILLECTOMY      Prior to Admission medications   Medication Sig Start Date End Date Taking? Authorizing  Provider  digoxin (LANOXIN) 0.125 MG tablet Take 1 tablet by mouth once daily 05/22/20   Minna Merritts, MD  fluconazole (DIFLUCAN) 150 MG tablet Take 1 tablet (150 mg total) by mouth every 3 (three) days as needed (for vaginal itching/yeast infection sx). 06/30/20   Delsa Grana, PA-C  fluconazole (DIFLUCAN) 150 MG tablet Take 1 tablet (150 mg total) by mouth every 3 (three) days as needed (for vaginal itching/yeast infection sx). 07/03/20   Delsa Grana, PA-C  FLUoxetine (PROZAC) 20 MG tablet Take 1 tablet by mouth once daily 07/08/20   Towanda Malkin, MD  furosemide (LASIX) 40 MG tablet Take 1 tablet (40 mg total) by mouth daily. 06/18/20   Minna Merritts, MD  gabapentin (NEURONTIN) 400 MG capsule Take 1 capsule (400 mg total) by mouth 3 (three) times daily. 05/29/20   Towanda Malkin, MD  lactulose (CHRONULAC) 10 GM/15ML solution Take 45 mLs (30 g total) by mouth 3 (three) times daily. Patient not taking: Reported on 07/02/2020 08/28/19   [name], NP  midodrine (PROAMATINE) 10 MG tablet Take 1 tablet (10 mg total) by mouth 3 (three) times daily with meals. 06/18/20   Minna Merritts, MD  nystatin (MYCOSTATIN) 100000 UNIT/ML suspension Take 5 mLs (500,000 Units total) by mouth 4 (four) times daily. 06/30/20   Delsa Grana, PA-C  pantoprazole (PROTONIX) 20 MG tablet Take 1 tablet (20 mg total) by mouth every morning. One hour before breakfast 02/26/20   Delsa Grana, PA-C  potassium chloride (KLOR-CON) 10 MEQ tablet Take 1 tablet (10 mEq total) by mouth daily. 06/18/20   Minna Merritts, MD  tiZANidine (ZANAFLEX) 4 MG capsule TAKE 1 CAPSULE BY MOUTH THREE TIMES DAILY AS NEEDED FOR MUSCLE SPASM 05/29/20   Towanda Malkin, MD    Allergies Patient has no known allergies.  Family History  Problem Relation Age of Onset  . Cancer Mother   . Cancer Father     Social History Social History   Tobacco Use  . Smoking status: Current Every Day Smoker     Packs/day: 1.00    Years: 35.00    Pack years: 35.00    Types: Cigarettes  . Smokeless tobacco: Never Used  Vaping Use  . Vaping Use: Never used  Substance Use Topics  . Alcohol use: Yes    Comment: 1 sip of wine this am  . Drug use: Never    Review of Systems Constitutional: No fever/chills Eyes: No visual changes. ENT: No sore throat. Cardiovascular: Denies chest pain. Respiratory: Denies shortness of breath. Gastrointestinal: Positive for abdominal pain.  Genitourinary: Negative for dysuria. Musculoskeletal: Positive for back pain. Skin: Negative for rash. Neurological: Positive for confusion. ____________________________________________   PHYSICAL EXAM:  VITAL SIGNS: ED Triage Vitals  Enc Vitals Group     BP 07/29/20 1538 106/64     Pulse Rate 07/29/20 1538 (!) 107     Resp 07/29/20 1538 18     Temp 07/29/20 1538 98.4 F (36.9 C)     Temp src --  SpO2 07/29/20 1538 100 %     Weight 07/29/20 1538 125 lb (56.7 kg)     Height 07/29/20 1538 5\' 9"  (1.753 m)     Head Circumference --      Peak Flow --      Pain Score 07/29/20 1537 7   Constitutional: Awake, confused.  Eyes: Conjunctivae are normal.  ENT      Head: Normocephalic and atraumatic.      Nose: No congestion/rhinnorhea.      Mouth/Throat: Mucous membranes are moist.      Neck: No stridor. Hematological/Lymphatic/Immunilogical: No cervical lymphadenopathy. Cardiovascular: Normal rate, regular rhythm.  No murmurs, rubs, or gallops.  Respiratory: Normal respiratory effort without tachypnea nor retractions. Breath sounds are clear and equal bilaterally. No wheezes/rales/rhonchi. Gastrointestinal: Soft and non tender. No rebound. No guarding.  Genitourinary: Deferred Musculoskeletal: Normal range of motion in all extremities. No lower extremity edema. Neurologic:  Not completely oriented. Moving all extremities.  Skin:  Skin is warm, dry and intact. No rash noted. Psychiatric: Mood and affect are  normal. Speech and behavior are normal. Patient exhibits appropriate insight and judgment.  ____________________________________________    LABS (pertinent positives/negatives)  Ammonia 46 BMP wnl except k 3.4, glu 109 Trop hs 32 CBC wbc 14.2, hgb 14.4, plt 178 COVID negative Procalcitonin <0.10 ____________________________________________   EKG  I, Nance Pear, attending physician, personally viewed and interpreted this EKG  EKG Time: 1535 Rate: 106 Rhythm: sinus tachycardia Axis: normal Intervals: qtc 480 QRS: low voltage, narrow ST changes: no st elevation Impression: abnormal ekg  ____________________________________________    RADIOLOGY  CT head No acute abnormality  CXR Left lower lobe airspace disease ____________________________________________   PROCEDURES  Procedures  ____________________________________________   INITIAL IMPRESSION / ASSESSMENT AND PLAN / ED COURSE  Pertinent labs & imaging results that were available during my care of the patient were reviewed by me and considered in my medical decision making (see chart for details).   Patient presented to the emergency department today because of concerns for some confusion, dizziness and falls.  Exam patient is not completely oriented.  Does have a history of cirrhosis and ammonia was slightly elevated.  Because of this I did have concerns for hepatic encephalopathy.  Patient was also found to have an elevated white count however procalcitonin was less than 0.10.  At this time I doubt infection.  Patient states she has been taking her lactulose however per chart review it sounds like there is some question about if she is truly compliant.  Will plan on giving lactulose here to start helping with ammonia level. Patient was also complaining of some back pain after her fall. X-ray was concerning for vertebral compression fracture. Will plan admission to hospital  service.   ____________________________________________   FINAL CLINICAL IMPRESSION(S) / ED DIAGNOSES  Final diagnoses:  Confusion  Hyperammonemia ([place])  Compression fracture of lumbar vertebra, unspecified lumbar vertebral level, initial encounter [facility])     Note: This dictation was prepared with Dragon dictation. Any transcriptional errors that result from this process are unintentional     Nance Pear, MD 07/29/20 2126    Nance Pear, MD 07/29/20 2127

## 2020-07-29 NOTE — ED Notes (Signed)
Red top sent to lab.  

## 2020-07-29 NOTE — Telephone Encounter (Signed)
Patient husband states pt possibly has a build up on ammonia and is acting a little delusional. Pts husband states that was a symptom to look for. Husband also states pt has pink stuff to drink but it makes her have cramping. Wanting to know wether they should go to the ER or not. Pt has appt Thur 12.2.21 @2 :45. Please advise.

## 2020-07-29 NOTE — Telephone Encounter (Signed)
Patient husband called back and states the patient states she is not going to take the lactulose because it makes her had diarrhea and abdominal cramping. Husband states she has not really eaten that much in the last week. Advised patient husband again to take her to the ER. He states that she is in the shower right now but will try again to take her to the ER.

## 2020-07-29 NOTE — ED Notes (Signed)
Pt choked when trying to drink lactulose - this RN sat pt up and pt was able to cough

## 2020-07-29 NOTE — ED Notes (Signed)
Attempted to place NG tube twice - unable to drop tube as pt is unable to drink water with insertion. MD aware

## 2020-07-29 NOTE — ED Triage Notes (Addendum)
Pt comes via POV from home with c/o dizziness and then falling. Pt unsure if she hit her head or not. Pt states trouble sleeping, loss of appetite and low BP. Pt keeps yawning while in triage. Pt states she hasn't slept in days.  Pt states lower back, leg and feet pain. Pt states CP and SOB.

## 2020-07-29 NOTE — Telephone Encounter (Signed)
Patient husband answer the phone and states that she has tried the lactulose and it is not effective. Informed husband that Dr. Marius Ditch recommends her then to go to the ER. Husband states she will not go if he tells her and handed the phone to the patient. Informed patient that Dr. Marius Ditch recommends her to go to the ER to get evaluated. Patient states last time she went she waiting 22 hours and then passed out in the waiting room. Informed patient she needed to go ASAP. She said she has asked her husband if she could take a shower first and put on clean clothes. Patient states that she asked that 2 hours ago and has not done it yet. Patient states she will think about going to the ER.

## 2020-07-30 DIAGNOSIS — I480 Paroxysmal atrial fibrillation: Secondary | ICD-10-CM

## 2020-07-30 DIAGNOSIS — Z8719 Personal history of other diseases of the digestive system: Secondary | ICD-10-CM

## 2020-07-30 DIAGNOSIS — W19XXXA Unspecified fall, initial encounter: Secondary | ICD-10-CM

## 2020-07-30 DIAGNOSIS — S32010D Wedge compression fracture of first lumbar vertebra, subsequent encounter for fracture with routine healing: Secondary | ICD-10-CM

## 2020-07-30 LAB — URINALYSIS, COMPLETE (UACMP) WITH MICROSCOPIC
Glucose, UA: NEGATIVE mg/dL
Hgb urine dipstick: NEGATIVE
Ketones, ur: 5 mg/dL — AB
Nitrite: NEGATIVE
Protein, ur: 30 mg/dL — AB
Specific Gravity, Urine: 1.028 (ref 1.005–1.030)
pH: 6 (ref 5.0–8.0)

## 2020-07-30 LAB — CBC
HCT: 41.7 % (ref 36.0–46.0)
Hemoglobin: 13.9 g/dL (ref 12.0–15.0)
MCH: 32.1 pg (ref 26.0–34.0)
MCHC: 33.3 g/dL (ref 30.0–36.0)
MCV: 96.3 fL (ref 80.0–100.0)
Platelets: 170 10*3/uL (ref 150–400)
RBC: 4.33 MIL/uL (ref 3.87–5.11)
RDW: 16.6 % — ABNORMAL HIGH (ref 11.5–15.5)
WBC: 14.6 10*3/uL — ABNORMAL HIGH (ref 4.0–10.5)
nRBC: 0 % (ref 0.0–0.2)

## 2020-07-30 LAB — COMPREHENSIVE METABOLIC PANEL
ALT: 31 U/L (ref 0–44)
AST: 62 U/L — ABNORMAL HIGH (ref 15–41)
Albumin: 3.1 g/dL — ABNORMAL LOW (ref 3.5–5.0)
Alkaline Phosphatase: 209 U/L — ABNORMAL HIGH (ref 38–126)
Anion gap: 12 (ref 5–15)
BUN: 12 mg/dL (ref 6–20)
CO2: 26 mmol/L (ref 22–32)
Calcium: 8.9 mg/dL (ref 8.9–10.3)
Chloride: 108 mmol/L (ref 98–111)
Creatinine, Ser: 0.43 mg/dL — ABNORMAL LOW (ref 0.44–1.00)
GFR, Estimated: >60 mL/min (ref >60)
Glucose, Bld: 77 mg/dL (ref 70–99)
Potassium: 3.3 mmol/L — ABNORMAL LOW (ref 3.5–5.1)
Sodium: 146 mmol/L — ABNORMAL HIGH (ref 135–145)
Total Bilirubin: 6.2 mg/dL — ABNORMAL HIGH (ref 0.3–1.2)
Total Protein: 5.7 g/dL — ABNORMAL LOW (ref 6.5–8.1)

## 2020-07-30 LAB — PHOSPHORUS: Phosphorus: 3.4 mg/dL (ref 2.5–4.6)

## 2020-07-30 LAB — MAGNESIUM: Magnesium: 1.8 mg/dL (ref 1.7–2.4)

## 2020-07-30 LAB — HIV ANTIBODY (ROUTINE TESTING W REFLEX): HIV Screen 4th Generation wRfx: NONREACTIVE

## 2020-07-30 MED ORDER — POTASSIUM CHLORIDE CRYS ER 10 MEQ PO TBCR
10.0000 meq | EXTENDED_RELEASE_TABLET | Freq: Every day | ORAL | Status: DC
  Administered 2020-07-30 – 2020-08-06 (×8): 10 meq via ORAL
  Filled 2020-07-30 (×8): qty 1

## 2020-07-30 MED ORDER — FLUOXETINE HCL 20 MG PO CAPS
20.0000 mg | ORAL_CAPSULE | Freq: Every day | ORAL | Status: DC
  Administered 2020-07-31 – 2020-08-06 (×7): 20 mg via ORAL
  Filled 2020-07-30 (×7): qty 1

## 2020-07-30 MED ORDER — PANTOPRAZOLE SODIUM 40 MG PO PACK
20.0000 mg | PACK | Freq: Every day | ORAL | Status: DC
  Filled 2020-07-30: qty 20

## 2020-07-30 MED ORDER — DIGOXIN 125 MCG PO TABS
125.0000 ug | ORAL_TABLET | Freq: Every day | ORAL | Status: DC
  Administered 2020-07-31 – 2020-08-06 (×7): 125 ug via ORAL
  Filled 2020-07-30 (×8): qty 1

## 2020-07-30 MED ORDER — MIDODRINE HCL 5 MG PO TABS
10.0000 mg | ORAL_TABLET | Freq: Three times a day (TID) | ORAL | Status: DC
  Administered 2020-07-30 – 2020-08-06 (×21): 10 mg via ORAL
  Filled 2020-07-30 (×23): qty 2

## 2020-07-30 MED ORDER — FUROSEMIDE 40 MG PO TABS
40.0000 mg | ORAL_TABLET | Freq: Every day | ORAL | Status: DC
  Administered 2020-07-31: 40 mg via ORAL
  Filled 2020-07-30: qty 1

## 2020-07-30 MED ORDER — LACTULOSE 10 GM/15ML PO SOLN
30.0000 g | Freq: Three times a day (TID) | ORAL | Status: DC
  Administered 2020-07-30 – 2020-08-06 (×21): 30 g via ORAL
  Filled 2020-07-30 (×21): qty 60

## 2020-07-30 MED ORDER — GABAPENTIN 400 MG PO CAPS
400.0000 mg | ORAL_CAPSULE | Freq: Three times a day (TID) | ORAL | Status: DC
  Administered 2020-07-30 – 2020-08-06 (×21): 400 mg via ORAL
  Filled 2020-07-30 (×21): qty 1

## 2020-07-30 MED ORDER — LACTULOSE ENEMA
300.0000 mL | Freq: Two times a day (BID) | ORAL | Status: DC
  Filled 2020-07-30 (×3): qty 300

## 2020-07-30 NOTE — Progress Notes (Addendum)
  Unable to complete some admission questions due to AMS. Tried calling spouse on the phone - no answer, will re-try in the AM.

## 2020-07-30 NOTE — Progress Notes (Addendum)
Bedside swallow eval completed. See report. Rec Dys 1 diet with nectar thick liquids to be fed meals ONLY when alert. Meds crushed in applesauce. Try lactulose given in small sips. Stop if any s/s of aspiration and notify ST. ST will follow and alter diet as Pt progresses.

## 2020-07-30 NOTE — Progress Notes (Signed)
   07/30/20 0007  Assess: MEWS Score  Temp 97.9 F (36.6 C)  BP 116/80  Pulse Rate (!) 113  Resp (!) 26  Level of Consciousness Alert  SpO2 94 %  O2 Device Nasal Cannula  O2 Flow Rate (L/min) 2 L/min  Assess: MEWS Score  MEWS Temp 0  MEWS Systolic 0  MEWS Pulse 2  MEWS RR 2  MEWS LOC 0  MEWS Score 4  MEWS Score Color Red  Assess: if the MEWS score is Yellow or Red  Were vital signs taken at a resting state? Yes  Focused Assessment No change from prior assessment  Early Detection of Sepsis Score *See Row Information* Low  MEWS guidelines implemented *See Row Information* Yes (previously red but will implement)  Treat  MEWS Interventions Escalated (See documentation below)  Pain Scale 0-10  Pain Score 0  Take Vital Signs  Increase Vital Sign Frequency  Red: Q 1hr X 4 then Q 4hr X 4, if remains red, continue Q 4hrs  Escalate  MEWS: Escalate Red: discuss with charge nurse/RN and provider, consider discussing with RRT  Notify: Charge Nurse/RN  Name of Charge Nurse/RN Notified Remo Lipps  Date Charge Nurse/RN Notified 07/30/20  Time Charge Nurse/RN Notified 0007  Notify: Provider  Provider Name/Title Ouma NP  Date Provider Notified 07/30/20  Time Provider Notified 0030  Notification Type Page  Notification Reason Other (Comment) (RED MEWS)  Response No new orders  Date of Provider Response 07/30/20  Time of Provider Response 0037  Notify: Rapid Response  Name of Rapid Response RN Notified  (no need for Rapid for now)  Pt admitted from the ED with MEWS of Red due to tachycardia and tachypnea. Was previously RED since 1700 yesterday. ED providers aware. Will continue close monitoring for now and still implement RED MEWS guidleines, charge Marquita Palms and NP Ouma informed of pt's vitals and plan of care.

## 2020-07-30 NOTE — Progress Notes (Signed)
Progress Note    Anita Reed  MHD:[id number] DOB: [birth year]  DOA: 07/29/2020 PCP: Towanda Malkin, MD      Brief Narrative:    Medical records reviewed and are as summarized below:  Anita Reed is a 59 y.o. female with medical history significant for alcoholic cirrhosis, with esophageal varices, paroxysmal A. fib not on anticoagulation, chronic hypotension on midodrine, history of GI bleed, depression and chronic pain, who was brought to the emergency room due to concern for confusion at home.      Assessment/Plan:   Active Problems:   Alcoholic cirrhosis of liver with ascites (HCC)    Alcoholic Cirrhosis of liver without ascites (HCC)   History of GI bleed   Underweight   Falls, initial encounter   Acute hepatic encephalopathy   Traumatic compression fracture of L1 lumbar vertebra (HCC)   Elevated troponin   Leucocytosis   AF (paroxysmal atrial fibrillation) (HCC)   Acute hepatic encephalopathy   Alcoholic cirrhosis of liver with ascites (HCC) Continue lactulose, Lasix and Aldactone.  Discontinue IV fluids.   Dysphagia  Speech therapist recommended dysphagia 1 diet. -EGD in October 2020 showed mild portal hypertensive gastropathy and grade 2 varices in the middlethird and lower third of the esophagus    Falls, subsequent encounter   Traumatic compression fracture of L1 lumbar vertebra, subacute ([place]) -Patient had a couple falls at home related to her confusion with chest x-ray showing subacute L1 vertebral compression fracture -No evidence of lower extremity neurologic deficit Analgesics as needed for pain.  Hypokalemia Replete potassium and monitor levels. -   Leucocytosis -Chest x-ray suggesting airspace disease on the left. -Procalcitonin less than 0.1. Covid and flu test negative -Low suspicion for pneumonia Urinalysis suggestive of pyuria but no urinary symptoms.  Obtain urine culture.  No antibiotics for now.  Elevated  troponin -Troponin 46 >38. Probably demand ischemia -No chest pain and EKG is nonacute -Continue to monitor  Paroxysmal atrial fibrillation ([place]) -Patient currently in sinus rhythm -Continue digoxin. Not currently on systemic anticoagulation -Not currently on aspirin reason uncertain    History of GI bleed H&H is stable.   Body mass index is 19.89 kg/m.  (Underweight)  Diet Order            DIET - DYS 1 Room service appropriate? Yes; Fluid consistency: Nectar Thick  Diet effective now                    Consultants:  None  Procedures:  None    Medications:   . [START ON 07/31/2020] digoxin  125 mcg Oral Daily  . enoxaparin (LOVENOX) injection  40 mg Subcutaneous Q24H  . [START ON 07/31/2020] FLUoxetine  20 mg Oral Daily  . [START ON 07/31/2020] furosemide  40 mg Oral Daily  . gabapentin  400 mg Oral TID  . lactulose  30 g Oral Once  . lactulose  30 g Oral TID  . lactulose  300 mL Rectal BID  . midodrine  10 mg Oral TID WC  . [START ON 07/31/2020] pantoprazole sodium  20 mg Oral Daily  . potassium chloride  10 mEq Oral Daily   Continuous Infusions:    Anti-infectives (From admission, onward)   None             Family Communication/Anticipated D/C date and plan/Code Status   DVT prophylaxis: enoxaparin (LOVENOX) injection 40 mg Start: 07/29/20 2100     Code Status: Full Code  Family Communication:  None Disposition Plan:    Status is: Inpatient  Remains inpatient appropriate because:Inpatient level of care appropriate due to severity of illness   Dispo: The patient is from: Home              Anticipated d/c is to: Home              Anticipated d/c date is: 2 days              Patient currently is not medically stable to d/c.           Subjective:   She is confused unable to provide an adequate history.  Objective:    Vitals:   07/30/20 0403 07/30/20 0743 07/30/20 1143 07/30/20 1613  BP: 131/85 124/82 120/81 113/67   Pulse: (!) 111 (!) 109 (!) 101 (!) 105  Resp: (!) 25 (!) 28 (!) 24 20  Temp: 97.9 F (36.6 C) 98.4 F (36.9 C) 98.7 F (37.1 C) 98.7 F (37.1 C)  TempSrc: Oral Oral    SpO2: 96% 95% 94% 100%  Weight:      Height:       No data found.   Intake/Output Summary (Last 24 hours) at 07/30/2020 1711 Last data filed at 07/30/2020 0400 Gross per 24 hour  Intake 201.84 ml  Output 250 ml  Net -48.16 ml   Filed Weights   07/29/20 1538 07/30/20 0007  Weight: 56.7 kg 61.1 kg    Exam:  GEN: NAD SKIN: No rash EYES: EOMI ENT: MMM CV: RRR PULM: CTA B ABD: soft, ND, NT, +BS CNS: AAO x 1 (person), non focal EXT: No edema or tenderness   Data Reviewed:   I have personally reviewed following labs and imaging studies:  Labs: Labs show the following:   Basic Metabolic Panel: Recent Labs  Lab 07/29/20 1541 07/30/20 0543  NA 145 146*  K 3.4* 3.3*  CL 106 108  CO2 26 26  GLUCOSE 109* 77  BUN 14 12  CREATININE 0.54 0.43*  CALCIUM 9.0 8.9  MG  --  1.8  PHOS  --  3.4   GFR Estimated Creatinine Clearance: 73.9 mL/min (A) (by C-G formula based on SCr of 0.43 mg/dL (L)). Liver Function Tests: Recent Labs  Lab 07/29/20 1800 07/30/20 0543  AST 68* 62*  ALT 37 31  ALKPHOS 221* 209*  BILITOT 5.7* 6.2*  PROT 6.0* 5.7*  ALBUMIN 3.2* 3.1*   No results for input(s): LIPASE, AMYLASE in the last 168 hours. Recent Labs  Lab 07/29/20 1542  AMMONIA 46*   Coagulation profile No results for input(s): INR, PROTIME in the last 168 hours.  CBC: Recent Labs  Lab 07/29/20 1541 07/30/20 0543  WBC 14.2* 14.6*  HGB 14.4 13.9  HCT 41.7 41.7  MCV 95.6 96.3  PLT 178 170   Cardiac Enzymes: No results for input(s): CKTOTAL, CKMB, CKMBINDEX, TROPONINI in the last 168 hours. BNP (last 3 results) No results for input(s): PROBNP in the last 8760 hours. CBG: No results for input(s): GLUCAP in the last 168 hours. D-Dimer: No results for input(s): DDIMER in the last 72 hours. Hgb  A1c: No results for input(s): HGBA1C in the last 72 hours. Lipid Profile: No results for input(s): CHOL, HDL, LDLCALC, TRIG, CHOLHDL, LDLDIRECT in the last 72 hours. Thyroid function studies: No results for input(s): TSH, T4TOTAL, T3FREE, THYROIDAB in the last 72 hours.  Invalid input(s): FREET3 Anemia work up: No results for input(s): VITAMINB12, FOLATE, FERRITIN, TIBC, IRON, RETICCTPCT in  the last 72 hours. Sepsis Labs: Recent Labs  Lab 07/29/20 1541 07/30/20 0543  PROCALCITON <0.10  --   WBC 14.2* 14.6*    Microbiology Recent Results (from the past 240 hour(s))  Resp Panel by RT-PCR (Flu A&B, Covid) Nasopharyngeal Swab     Status: None   Collection Time: 07/29/20  6:48 PM   Specimen: Nasopharyngeal Swab; Nasopharyngeal(NP) swabs in vial transport medium  Result Value Ref Range Status   SARS Coronavirus 2 by RT PCR NEGATIVE NEGATIVE Final    Comment: (NOTE) SARS-CoV-2 target nucleic acids are NOT DETECTED.  The SARS-CoV-2 RNA is generally detectable in upper respiratory specimens during the acute phase of infection. The lowest concentration of SARS-CoV-2 viral copies this assay can detect is 138 copies/mL. A negative result does not preclude SARS-Cov-2 infection and should not be used as the sole basis for treatment or other patient management decisions. A negative result may occur with  improper specimen collection/handling, submission of specimen other than nasopharyngeal swab, presence of viral mutation(s) within the areas targeted by this assay, and inadequate number of viral copies(<138 copies/mL). A negative result must be combined with clinical observations, patient history, and epidemiological information. The expected result is Negative.  Fact Sheet for Patients:  EntrepreneurPulse.com.au  Fact Sheet for Healthcare Providers:  IncredibleEmployment.be  This test is no t yet approved or cleared by the [place] FDA and   has been authorized for detection and/or diagnosis of SARS-CoV-2 by FDA under an Emergency Use Authorization (EUA). This EUA will remain  in effect (meaning this test can be used) for the duration of the COVID-19 declaration under Section 564(b)(1) of the Act, 21 U.S.C.section 360bbb-3(b)(1), unless the authorization is terminated  or revoked sooner.       Influenza A by PCR NEGATIVE NEGATIVE Final   Influenza B by PCR NEGATIVE NEGATIVE Final    Comment: (NOTE) The Xpert Xpress SARS-CoV-2/FLU/RSV plus assay is intended as an aid in the diagnosis of influenza from Nasopharyngeal swab specimens and should not be used as a sole basis for treatment. Nasal washings and aspirates are unacceptable for Xpert Xpress SARS-CoV-2/FLU/RSV testing.  Fact Sheet for Patients: EntrepreneurPulse.com.au  Fact Sheet for Healthcare Providers: IncredibleEmployment.be  This test is not yet approved or cleared by the [place] FDA and has been authorized for detection and/or diagnosis of SARS-CoV-2 by FDA under an Emergency Use Authorization (EUA). This EUA will remain in effect (meaning this test can be used) for the duration of the COVID-19 declaration under Section 564(b)(1) of the Act, 21 U.S.C. section 360bbb-3(b)(1), unless the authorization is terminated or revoked.  Performed at [facility], [address] [place], [place] [address]     Procedures and diagnostic studies:  DG Chest 2 View  Result Date: 07/29/2020 CLINICAL DATA:  Chest pain, dizziness and shortness of breath EXAM: CHEST - 2 VIEW COMPARISON:  June 02, 2020 FINDINGS: Trachea is midline. Cardiomediastinal contours are stable accounting for partially obscured LEFT heart border due to elevated LEFT hemidiaphragm and LEFT lower lobe airspace disease. Findings likely associated with small LEFT effusion though not to the extent that was seen on the study of June 02, 2020. RIGHT  lung is clear. On limited assessment no acute skeletal process. IMPRESSION: LEFT lower lobe airspace disease with small LEFT effusion, pleural fluid diminished since October of 2021. Cardiomegaly as before. Elevated LEFT hemidiaphragm as before. Electronically Signed   By: Zetta Bills M.D.   On: 07/29/2020 16:24   DG Lumbar Spine  Complete  Result Date: 07/29/2020 CLINICAL DATA:  Low back pain after fall. EXAM: LUMBAR SPINE - COMPLETE 4+ VIEW COMPARISON:  May 30, 2020. FINDINGS: There is interval development of moderate compression deformity involving the L2 vertebral body consistent with acute to subacute fracture. No spondylolisthesis is noted. Mild degenerative disc disease is noted at L5-S1 with anterior osteophyte formation. IMPRESSION: Interval development of moderate compression deformity involving L2 vertebral body consistent with acute to subacute fracture. MRI may be performed for further evaluation. Electronically Signed   By: Marijo Conception M.D.   On: 07/29/2020 19:18   CT Head Wo Contrast  Result Date: 07/29/2020 CLINICAL DATA:  Dizziness, nonspecific; headache, classic migraine. Additional history provided: Patient reports dizziness with fall. EXAM: CT HEAD WITHOUT CONTRAST TECHNIQUE: Contiguous axial images were obtained from the base of the skull through the vertex without intravenous contrast. COMPARISON:  No pertinent prior exams available for comparison. FINDINGS: Brain: Mild cerebral and cerebellar atrophy. Mild ill-defined hypoattenuation within the cerebral white matter is nonspecific, but compatible with chronic small vessel ischemic disease. There is no acute intracranial hemorrhage. No demarcated cortical infarct. No extra-axial fluid collection. No evidence of intracranial mass. No midline shift. Vascular: No hyperdense vessel.  Atherosclerotic calcifications. Skull: No calvarial fracture or focal suspicious osseous lesion. Sinuses/Orbits: Visualized orbits show no acute  finding. Trace ethmoid and right maxillary sinus mucosal thickening at the imaged levels. IMPRESSION: No evidence of acute intracranial abnormality. Mild atrophy of the brain and chronic small vessel ischemic disease. Electronically Signed   By: Kellie Simmering DO   On: 07/29/2020 16:44               LOS: 1 day   Kamira Mellette  [facility]   Pager on [email or web address]. If 7PM-7AM, please contact night-coverage at [email or web address]     07/30/2020, 5:11 PM

## 2020-07-30 NOTE — Evaluation (Signed)
Clinical/Bedside Swallow Evaluation Patient Details  Name: Anita Reed MRN: [id number] Date of Birth: 1961/07/10  Today's Date: 07/30/2020 Time: SLP Start Time (ACUTE ONLY): 0845 SLP Stop Time (ACUTE ONLY): 0931 SLP Time Calculation (min) (ACUTE ONLY): 46 min  Past Medical History:  Past Medical History:  Diagnosis Date  . Anemia   . Atrial flutter ([place])   . Cancer [facility])    lymphoma 11 yrs. ago  . Cirrhosis ([place])   . Dysrhythmia   . Esophageal varices ([place])   . GERD (gastroesophageal reflux disease)   . Hepatitis    hep C-resolved  . History of hiatal hernia   . Hypertension   . Pneumonia   . Portal hypertension (HCC)    Past Surgical History:  Past Surgical History:  Procedure Laterality Date  . ABDOMINAL HYSTERECTOMY    . APPENDECTOMY    . COLONOSCOPY N/A 06/03/2019   Procedure: COLONOSCOPY;  Surgeon: Toledo, Benay Pike, MD;  Location: [facility] ENDOSCOPY;  Service: Gastroenterology;  Laterality: N/A;  . EPIGASTRIC HERNIA REPAIR N/A 03/21/2020   Procedure: HERNIA REPAIR EPIGASTRIC ADULT, Open;  Surgeon: [name], MD;  Location: [facility] ORS;  Service: General;  Laterality: N/A;  . ESOPHAGOGASTRODUODENOSCOPY N/A 01/29/2019   Procedure: ESOPHAGOGASTRODUODENOSCOPY (EGD);  Surgeon: [name], MD;  Location: [facility] ENDOSCOPY;  Service: Gastroenterology;  Laterality: N/A;  . ESOPHAGOGASTRODUODENOSCOPY N/A 06/01/2019   Procedure: ESOPHAGOGASTRODUODENOSCOPY (EGD);  Surgeon: Toledo, Benay Pike, MD;  Location: [facility] ENDOSCOPY;  Service: Gastroenterology;  Laterality: N/A;  . IR PARACENTESIS  08/27/2019  . IR RADIOLOGIST EVAL & MGMT  08/08/2019  . IR RADIOLOGIST EVAL & MGMT  11/14/2019  . IR TIPS  08/27/2019  . RADIOLOGY WITH ANESTHESIA N/A 08/27/2019   Procedure: IR WITH ANESTHESIA TIPS PROCEDURE;  Surgeon: [name], MD;  Location: [place];  Service: Radiology;  Laterality: N/A;  . TONSILLECTOMY     HPI:  Per admitting H&P " Anita Reed is a 59 y.o. female with medical  history significant for alcoholic cirrhosis, with esophageal varices, paroxysmal A. fib not on anticoagulation, chronic hypotension on midodrine, history of GI bleed, depression and chronic pain, who was brought to the emergency room due to concern for confusion at home. Most of the history taken from patient's husband and ER report due to confusion. Patient has been using lactulose as prescribed with frequent bowel movements daily. The confusion has led to a couple falls and she complains of back pain and headache. She has had no nausea, vomiting, abdominal pain or dysuria. Denies cough, fever chills or shortness of breath"   Assessment / Plan / Recommendation Clinical Impression  Bedside swallow eval today was limited secondary to lethargy and confusion. Pt was sleeping upon entry but awakened with tactile and verbal cues. Needed reminders to stay awake throughout eval. Also noted slight labored breathing. Pulse ox placed and remained at 96 percent throughout assessment. Oral mech exam revealed overall adequate strength and coordination. Given the spoon, Pt often tried to bite down instead of seal with her lips. She also needed cues to seal her lips around the cup and straw. No s/s of aspiration with applesauce, water, or nectar thick liquids. Rec Dys 1 with nectar thick liquids for now secondary to altered mental status. ONLY feed when fully alert. Meds crushed in apple sauce. Try lactulose given by small sips from the cup. ST to follow up with toleration of diet and advance or adjust as needed.  SLP Visit Diagnosis: Dysphagia, oropharyngeal phase (R13.12)    Aspiration Risk  Moderate aspiration risk    Diet Recommendation Dysphagia 1 (Puree)   Liquid Administration via: Cup Medication Administration: Crushed with puree Supervision: Staff to assist with self feeding Compensations: Small sips/bites;Minimize environmental distractions;Slow rate;Other (Comment) (Must be alert for meals) Postural  Changes: Seated upright at 90 degrees;Remain upright for at least 30 minutes after po intake    Other  Recommendations Oral Care Recommendations: Oral care BID   Follow up Recommendations   ST to follow     Frequency and Duration min 3x week  1 week       Prognosis Prognosis for Safe Diet Advancement: Good Barriers to Reach Goals: Cognitive deficits      Swallow Study   General Date of Onset: 07/29/20 HPI: Per admitting H&P " Anita Reed is a 59 y.o. female with medical history significant for alcoholic cirrhosis, with esophageal varices, paroxysmal A. fib not on anticoagulation, chronic hypotension on midodrine, history of GI bleed, depression and chronic pain, who was brought to the emergency room due to concern for confusion at home. Most of the history taken from patient's husband and ER report due to confusion. Patient has been using lactulose as prescribed with frequent bowel movements daily. The confusion has led to a couple falls and she complains of back pain and headache. She has had no nausea, vomiting, abdominal pain or dysuria. Denies cough, fever chills or shortness of breath" Type of Study: Bedside Swallow Evaluation Previous Swallow Assessment: None noted. Failed Nsg swallow screen Diet Prior to this Study: NPO Respiratory Status: Nasal cannula History of Recent Intubation: No Behavior/Cognition: Cooperative;Confused;Lethargic/Drowsy Oral Cavity Assessment: Dry;Dried secretions Oral Cavity - Dentition: Adequate natural dentition Self-Feeding Abilities: Total assist Patient Positioning: Upright in bed Baseline Vocal Quality: Low vocal intensity    Oral/Motor/Sensory Function Overall Oral Motor/Sensory Function: Within functional limits   Ice Chips Ice chips: Within functional limits Presentation: Spoon   Thin Liquid Thin Liquid: Impaired Presentation: Cup;Straw Oral Phase Impairments: Poor awareness of bolus Oral Phase Functional Implications: Other (comment)  (AMS resulted in not knowing to put lips around the cup)    Nectar Thick Nectar Thick Liquid: Within functional limits Presentation: Cup   Honey Thick     Puree Puree: Impaired Presentation: Spoon Oral Phase Impairments:  (Biting the spoon)   Solid     Solid: Not tested      Lucila Maine 07/30/2020,9:32 AM

## 2020-07-31 ENCOUNTER — Ambulatory Visit: Payer: Medicare Other | Admitting: Gastroenterology

## 2020-07-31 DIAGNOSIS — R778 Other specified abnormalities of plasma proteins: Secondary | ICD-10-CM

## 2020-07-31 LAB — BASIC METABOLIC PANEL
Anion gap: 12 (ref 5–15)
BUN: 9 mg/dL (ref 6–20)
CO2: 27 mmol/L (ref 22–32)
Calcium: 8.9 mg/dL (ref 8.9–10.3)
Chloride: 109 mmol/L (ref 98–111)
Creatinine, Ser: 0.55 mg/dL (ref 0.44–1.00)
GFR, Estimated: >60 mL/min (ref >60)
Glucose, Bld: 82 mg/dL (ref 70–99)
Potassium: 3.1 mmol/L — ABNORMAL LOW (ref 3.5–5.1)
Sodium: 148 mmol/L — ABNORMAL HIGH (ref 135–145)

## 2020-07-31 LAB — CBC WITH DIFFERENTIAL/PLATELET
Abs Immature Granulocytes: 0.07 10*3/uL (ref 0.00–0.07)
Basophils Absolute: 0.1 10*3/uL (ref 0.0–0.1)
Basophils Relative: 1 %
Eosinophils Absolute: 0.2 10*3/uL (ref 0.0–0.5)
Eosinophils Relative: 2 %
HCT: 46 % (ref 36.0–46.0)
Hemoglobin: 15.5 g/dL — ABNORMAL HIGH (ref 12.0–15.0)
Immature Granulocytes: 1 %
Lymphocytes Relative: 22 %
Lymphs Abs: 2.7 10*3/uL (ref 0.7–4.0)
MCH: 32.8 pg (ref 26.0–34.0)
MCHC: 33.7 g/dL (ref 30.0–36.0)
MCV: 97.3 fL (ref 80.0–100.0)
Monocytes Absolute: 1.7 10*3/uL — ABNORMAL HIGH (ref 0.1–1.0)
Monocytes Relative: 14 %
Neutro Abs: 7.5 10*3/uL (ref 1.7–7.7)
Neutrophils Relative %: 60 %
Platelets: 155 10*3/uL (ref 150–400)
RBC: 4.73 MIL/uL (ref 3.87–5.11)
RDW: 16.8 % — ABNORMAL HIGH (ref 11.5–15.5)
WBC: 12.2 10*3/uL — ABNORMAL HIGH (ref 4.0–10.5)
nRBC: 0 % (ref 0.0–0.2)

## 2020-07-31 MED ORDER — SODIUM CHLORIDE 0.45 % IV SOLN
INTRAVENOUS | Status: DC
  Filled 2020-07-31 (×4): qty 1000

## 2020-07-31 MED ORDER — PANTOPRAZOLE SODIUM 40 MG IV SOLR
40.0000 mg | INTRAVENOUS | Status: DC
  Administered 2020-07-31 – 2020-08-02 (×3): 40 mg via INTRAVENOUS
  Filled 2020-07-31 (×3): qty 40

## 2020-07-31 MED ORDER — OXYCODONE HCL 5 MG PO TABS
5.0000 mg | ORAL_TABLET | Freq: Three times a day (TID) | ORAL | Status: DC | PRN
  Administered 2020-07-31 – 2020-08-04 (×10): 5 mg via ORAL
  Filled 2020-07-31 (×11): qty 1

## 2020-07-31 NOTE — Progress Notes (Addendum)
Progress Note    Anita Reed  NID:[id number] DOB: Oct 24, 1960  DOA: 07/29/2020 PCP: Towanda Malkin, MD      Brief Narrative:    Medical records reviewed and are as summarized below:  Anita Reed is a 59 y.o. female with medical history significant for alcoholic cirrhosis, with esophageal varices, paroxysmal A. fib not on anticoagulation, chronic hypotension on midodrine, history of GI bleed, depression and chronic pain, who was brought to the emergency room due to concern for confusion at home.      Assessment/Plan:   Active Problems:   Alcoholic cirrhosis of liver with ascites (HCC)    Alcoholic Cirrhosis of liver without ascites (HCC)   History of GI bleed   Underweight   Falls, initial encounter   Acute hepatic encephalopathy   Traumatic compression fracture of L1 lumbar vertebra (HCC)   Elevated troponin   Leucocytosis   AF (paroxysmal atrial fibrillation) (HCC)   Acute hepatic encephalopathy   Alcoholic cirrhosis of liver with ascites (HCC) Continue lactulose.  Hold Lasix.  Hypernatremia Restart IV fluids and monitor sodium level.[name]  Dysphagia  Speech therapist recommended dysphagia 1 diet. -EGD in October 2020 showed mild portal hypertensive gastropathy and grade 2 varices in the middlethird and lower third of the esophagus    Falls, subsequent encounter   Traumatic compression fracture of L1 lumbar vertebra, subacute ([place]) -Patient had a couple falls at home related to her confusion with chest x-ray showing subacute L1 vertebral compression fracture -No evidence of lower extremity neurologic deficit Analgesics as needed for pain.  Consult PT  Abdominal pain and bilateral leg pain There is no swelling of the abdomen or lower extremities.  Start oxycodone as needed for pain.  Continue to monitor.  Hypokalemia Replete potassium and monitor levels. -   Leucocytosis -Chest x-ray suggesting airspace disease on the left. -Procalcitonin  less than 0.1. Covid and flu test negative -Low suspicion for pneumonia Urinalysis suggestive of pyuria but no urinary symptoms.  Urine culture is pending.  No antibiotics for now.  Elevated troponin -Troponin 46 >38. Probably demand ischemia -No chest pain and EKG is nonacute -Continue to monitor  Paroxysmal atrial fibrillation ([place]) -Patient currently in sinus rhythm -Continue digoxin. Not currently on systemic anticoagulation -Not currently on aspirin reason uncertain    History of GI bleed H&H is stable.   Body mass index is 19.89 kg/m.  (Underweight)  Diet Order            DIET - DYS 1 Room service appropriate? Yes; Fluid consistency: Nectar Thick  Diet effective now                    Consultants:  None  Procedures:  None    Medications:   . digoxin  125 mcg Oral Daily  . enoxaparin (LOVENOX) injection  40 mg Subcutaneous Q24H  . FLUoxetine  20 mg Oral Daily  . gabapentin  400 mg Oral TID  . lactulose  30 g Oral Once  . lactulose  30 g Oral TID  . midodrine  10 mg Oral TID WC  . pantoprazole (PROTONIX) IV  40 mg Intravenous Q24H  . potassium chloride  10 mEq Oral Daily   Continuous Infusions: . sodium chloride 0.45 % with kcl 100 mL/hr at 07/31/20 1039     Anti-infectives (From admission, onward)   None             Family Communication/Anticipated D/C date and plan/Code Status  DVT prophylaxis: enoxaparin (LOVENOX) injection 40 mg Start: 07/29/20 2100     Code Status: Full Code  Family Communication: None Disposition Plan:    Status is: Inpatient  Remains inpatient appropriate because:Inpatient level of care appropriate due to severity of illness   Dispo: The patient is from: Home              Anticipated d/c is to: Home              Anticipated d/c date is: 2 days              Patient currently is not medically stable to d/c.           Subjective:   C/o pain in the legs and abdomen.  She also complains  of thirst.  No vomiting or diarrhea.  Objective:    Vitals:   07/31/20 0114 07/31/20 0400 07/31/20 0753 07/31/20 1157  BP: 106/66 (!) 127/95 122/80 (!) 124/97  Pulse: (!) 102 (!) 107 (!) 105 (!) 104  Resp: 20 20 20 20   Temp: 97.7 F (36.5 C) 98.1 F (36.7 C) 98 F (36.7 C) 98 F (36.7 C)  TempSrc: Oral  Oral Oral  SpO2: 100% 95% 96% 99%  Weight:      Height:       No data found.   Intake/Output Summary (Last 24 hours) at 07/31/2020 1440 Last data filed at 07/31/2020 0401 Gross per 24 hour  Intake 200 ml  Output 600 ml  Net -400 ml   Filed Weights   07/29/20 1538 07/30/20 0007  Weight: 56.7 kg 61.1 kg    Exam:  GEN: NAD SKIN: No rash EYES: EOMI ENT: MMM CV: RRR PULM: CTA B ABD: soft, ND, NT, +BS CNS: AAO x 1 (person), non focal EXT: No edema or tenderness   Data Reviewed:   I have personally reviewed following labs and imaging studies:  Labs: Labs show the following:   Basic Metabolic Panel: Recent Labs  Lab 07/29/20 1541 07/29/20 1541 07/30/20 0543 07/31/20 0614  NA 145  --  146* 148*  K 3.4*   < > 3.3* 3.1*  CL 106  --  108 109  CO2 26  --  26 27  GLUCOSE 109*  --  77 82  BUN 14  --  12 9  CREATININE 0.54  --  0.43* 0.55  CALCIUM 9.0  --  8.9 8.9  MG  --   --  1.8  --   PHOS  --   --  3.4  --    < > = values in this interval not displayed.   GFR Estimated Creatinine Clearance: 73.9 mL/min (by C-G formula based on SCr of 0.55 mg/dL). Liver Function Tests: Recent Labs  Lab 07/29/20 1800 07/30/20 0543  AST 68* 62*  ALT 37 31  ALKPHOS 221* 209*  BILITOT 5.7* 6.2*  PROT 6.0* 5.7*  ALBUMIN 3.2* 3.1*   No results for input(s): LIPASE, AMYLASE in the last 168 hours. Recent Labs  Lab 07/29/20 1542  AMMONIA 46*   Coagulation profile No results for input(s): INR, PROTIME in the last 168 hours.  CBC: Recent Labs  Lab 07/29/20 1541 07/30/20 0543 07/31/20 0614  WBC 14.2* 14.6* 12.2*  NEUTROABS  --   --  7.5  HGB 14.4 13.9 15.5*   HCT 41.7 41.7 46.0  MCV 95.6 96.3 97.3  PLT 178 170 155   Cardiac Enzymes: No results for input(s): CKTOTAL, CKMB, CKMBINDEX, TROPONINI in  the last 168 hours. BNP (last 3 results) No results for input(s): PROBNP in the last 8760 hours. CBG: No results for input(s): GLUCAP in the last 168 hours. D-Dimer: No results for input(s): DDIMER in the last 72 hours. Hgb A1c: No results for input(s): HGBA1C in the last 72 hours. Lipid Profile: No results for input(s): CHOL, HDL, LDLCALC, TRIG, CHOLHDL, LDLDIRECT in the last 72 hours. Thyroid function studies: No results for input(s): TSH, T4TOTAL, T3FREE, THYROIDAB in the last 72 hours.  Invalid input(s): FREET3 Anemia work up: No results for input(s): VITAMINB12, FOLATE, FERRITIN, TIBC, IRON, RETICCTPCT in the last 72 hours. Sepsis Labs: Recent Labs  Lab 07/29/20 1541 07/30/20 0543 07/31/20 0614  PROCALCITON <0.10  --   --   WBC 14.2* 14.6* 12.2*    Microbiology Recent Results (from the past 240 hour(s))  Resp Panel by RT-PCR (Flu A&B, Covid) Nasopharyngeal Swab     Status: None   Collection Time: 07/29/20  6:48 PM   Specimen: Nasopharyngeal Swab; Nasopharyngeal(NP) swabs in vial transport medium  Result Value Ref Range Status   SARS Coronavirus 2 by RT PCR NEGATIVE NEGATIVE Final    Comment: (NOTE) SARS-CoV-2 target nucleic acids are NOT DETECTED.  The SARS-CoV-2 RNA is generally detectable in upper respiratory specimens during the acute phase of infection. The lowest concentration of SARS-CoV-2 viral copies this assay can detect is 138 copies/mL. A negative result does not preclude SARS-Cov-2 infection and should not be used as the sole basis for treatment or other patient management decisions. A negative result may occur with  improper specimen collection/handling, submission of specimen other than nasopharyngeal swab, presence of viral mutation(s) within the areas targeted by this assay, and inadequate number of  viral copies(<138 copies/mL). A negative result must be combined with clinical observations, patient history, and epidemiological information. The expected result is Negative.  Fact Sheet for Patients:  EntrepreneurPulse.com.au  Fact Sheet for Healthcare Providers:  IncredibleEmployment.be  This test is no t yet approved or cleared by the [place] FDA and  has been authorized for detection and/or diagnosis of SARS-CoV-2 by FDA under an Emergency Use Authorization (EUA). This EUA will remain  in effect (meaning this test can be used) for the duration of the COVID-19 declaration under Section 564(b)(1) of the Act, 21 U.S.C.section 360bbb-3(b)(1), unless the authorization is terminated  or revoked sooner.       Influenza A by PCR NEGATIVE NEGATIVE Final   Influenza B by PCR NEGATIVE NEGATIVE Final    Comment: (NOTE) The Xpert Xpress SARS-CoV-2/FLU/RSV plus assay is intended as an aid in the diagnosis of influenza from Nasopharyngeal swab specimens and should not be used as a sole basis for treatment. Nasal washings and aspirates are unacceptable for Xpert Xpress SARS-CoV-2/FLU/RSV testing.  Fact Sheet for Patients: EntrepreneurPulse.com.au  Fact Sheet for Healthcare Providers: IncredibleEmployment.be  This test is not yet approved or cleared by the [place] FDA and has been authorized for detection and/or diagnosis of SARS-CoV-2 by FDA under an Emergency Use Authorization (EUA). This EUA will remain in effect (meaning this test can be used) for the duration of the COVID-19 declaration under Section 564(b)(1) of the Act, 21 U.S.C. section 360bbb-3(b)(1), unless the authorization is terminated or revoked.  Performed at [facility], [address] [place], [place] [address]     Procedures and diagnostic studies:  DG Chest 2 View  Result Date: 07/29/2020 CLINICAL DATA:  Chest  pain, dizziness and shortness of breath EXAM: CHEST - 2  VIEW COMPARISON:  June 02, 2020 FINDINGS: Trachea is midline. Cardiomediastinal contours are stable accounting for partially obscured LEFT heart border due to elevated LEFT hemidiaphragm and LEFT lower lobe airspace disease. Findings likely associated with small LEFT effusion though not to the extent that was seen on the study of June 02, 2020. RIGHT lung is clear. On limited assessment no acute skeletal process. IMPRESSION: LEFT lower lobe airspace disease with small LEFT effusion, pleural fluid diminished since October of 2021. Cardiomegaly as before. Elevated LEFT hemidiaphragm as before. Electronically Signed   By: Zetta Bills M.D.   On: 07/29/2020 16:24   DG Lumbar Spine Complete  Result Date: 07/29/2020 CLINICAL DATA:  Low back pain after fall. EXAM: LUMBAR SPINE - COMPLETE 4+ VIEW COMPARISON:  May 30, 2020. FINDINGS: There is interval development of moderate compression deformity involving the L2 vertebral body consistent with acute to subacute fracture. No spondylolisthesis is noted. Mild degenerative disc disease is noted at L5-S1 with anterior osteophyte formation. IMPRESSION: Interval development of moderate compression deformity involving L2 vertebral body consistent with acute to subacute fracture. MRI may be performed for further evaluation. Electronically Signed   By: Marijo Conception M.D.   On: 07/29/2020 19:18   CT Head Wo Contrast  Result Date: 07/29/2020 CLINICAL DATA:  Dizziness, nonspecific; headache, classic migraine. Additional history provided: Patient reports dizziness with fall. EXAM: CT HEAD WITHOUT CONTRAST TECHNIQUE: Contiguous axial images were obtained from the base of the skull through the vertex without intravenous contrast. COMPARISON:  No pertinent prior exams available for comparison. FINDINGS: Brain: Mild cerebral and cerebellar atrophy. Mild ill-defined hypoattenuation within the cerebral white matter is  nonspecific, but compatible with chronic small vessel ischemic disease. There is no acute intracranial hemorrhage. No demarcated cortical infarct. No extra-axial fluid collection. No evidence of intracranial mass. No midline shift. Vascular: No hyperdense vessel.  Atherosclerotic calcifications. Skull: No calvarial fracture or focal suspicious osseous lesion. Sinuses/Orbits: Visualized orbits show no acute finding. Trace ethmoid and right maxillary sinus mucosal thickening at the imaged levels. IMPRESSION: No evidence of acute intracranial abnormality. Mild atrophy of the brain and chronic small vessel ischemic disease. Electronically Signed   By: Kellie Simmering DO   On: 07/29/2020 16:44               LOS: 2 days   Oberon Hehir  [facility]   Pager on [email or web address]. If 7PM-7AM, please contact night-coverage at [email or web address]     07/31/2020, 2:40 PM

## 2020-07-31 NOTE — Progress Notes (Signed)
Chart reviewed, Pt visited. Pt sleeping mostly unchanged. Not appropriate for trials of upgrade. Continue with current Dys 2 diet to be fed ONLY when alert enough to take. Meds crushed in applesauce. Will continue to follow.

## 2020-08-01 LAB — BASIC METABOLIC PANEL
Anion gap: 12 (ref 5–15)
BUN: 7 mg/dL (ref 6–20)
CO2: 26 mmol/L (ref 22–32)
Calcium: 8.4 mg/dL — ABNORMAL LOW (ref 8.9–10.3)
Chloride: 107 mmol/L (ref 98–111)
Creatinine, Ser: 0.44 mg/dL (ref 0.44–1.00)
GFR, Estimated: >60 mL/min (ref >60)
Glucose, Bld: 107 mg/dL — ABNORMAL HIGH (ref 70–99)
Potassium: 3.6 mmol/L (ref 3.5–5.1)
Sodium: 145 mmol/L (ref 135–145)

## 2020-08-01 LAB — URINE CULTURE: Culture: <10000 — AB

## 2020-08-01 LAB — CBC WITH DIFFERENTIAL/PLATELET
Abs Immature Granulocytes: 0.09 10*3/uL — ABNORMAL HIGH (ref 0.00–0.07)
Basophils Absolute: 0.1 10*3/uL (ref 0.0–0.1)
Basophils Relative: 1 %
Eosinophils Absolute: 0.5 10*3/uL (ref 0.0–0.5)
Eosinophils Relative: 4 %
HCT: 43.5 % (ref 36.0–46.0)
Hemoglobin: 14.4 g/dL (ref 12.0–15.0)
Immature Granulocytes: 1 %
Lymphocytes Relative: 21 %
Lymphs Abs: 3.1 10*3/uL (ref 0.7–4.0)
MCH: 32.5 pg (ref 26.0–34.0)
MCHC: 33.1 g/dL (ref 30.0–36.0)
MCV: 98.2 fL (ref 80.0–100.0)
Monocytes Absolute: 2.1 10*3/uL — ABNORMAL HIGH (ref 0.1–1.0)
Monocytes Relative: 14 %
Neutro Abs: 8.8 10*3/uL — ABNORMAL HIGH (ref 1.7–7.7)
Neutrophils Relative %: 59 %
Platelets: 161 10*3/uL (ref 150–400)
RBC: 4.43 MIL/uL (ref 3.87–5.11)
RDW: 17 % — ABNORMAL HIGH (ref 11.5–15.5)
WBC: 14.7 10*3/uL — ABNORMAL HIGH (ref 4.0–10.5)
nRBC: 0 % (ref 0.0–0.2)

## 2020-08-01 MED ORDER — BISACODYL 10 MG RE SUPP: 10.0000 mg | Freq: Every day | RECTAL | Status: DC

## 2020-08-01 MED ORDER — POTASSIUM CHLORIDE IN NACL 20-0.9 MEQ/L-% IV SOLN
INTRAVENOUS | Status: DC
  Filled 2020-08-01 (×2): qty 1000

## 2020-08-01 MED ORDER — KETOROLAC TROMETHAMINE 30 MG/ML IJ SOLN
30.0000 mg | Freq: Once | INTRAMUSCULAR | Status: AC
  Administered 2020-08-01: 30 mg via INTRAVENOUS
  Filled 2020-08-01: qty 1

## 2020-08-01 MED ORDER — FLUDROCORTISONE ACETATE 0.1 MG PO TABS
0.0500 mg | ORAL_TABLET | Freq: Every day | ORAL | Status: DC
  Administered 2020-08-01 – 2020-08-06 (×6): 0.05 mg via ORAL
  Filled 2020-08-01 (×6): qty 0.5

## 2020-08-01 NOTE — Care Management Important Message (Signed)
Important Message  Patient Details  Name: Anita Reed MRN: [id number] Date of Birth: Dec 23, 1960   Medicare Important Message Given:  Yes     Dannette Barbara 08/01/2020, 11:58 AM

## 2020-08-01 NOTE — Progress Notes (Signed)
Assumed care of pt from Suzi Roots, RN at this time. NAD noted, call bell within reach, will continue to monitor.

## 2020-08-01 NOTE — Progress Notes (Signed)
PT Cancellation Note  Patient Details Name: Anita Reed MRN: [id number] DOB: 01-Mar-1961   Cancelled Treatment:    Reason Eval/Treat Not Completed: Fatigue/lethargy limiting ability to participate (Chart reviewed and evaluation attempted.  Patient notably fatigued, mod SOB at rest; declines participation with session at this time.  Request therapist hold today and re-attempt next date.  Will continue efforts as appropriate.)   Odyn Turko H. Owens Shark, PT, DPT, NCS 08/01/20, 2:10 PM [phone]

## 2020-08-01 NOTE — Progress Notes (Signed)
Progress Note    Anita Reed  INO:[id number] DOB: 12/19/1960  DOA: 07/29/2020 PCP: Towanda Malkin, MD      Brief Narrative:    Medical records reviewed and are as summarized below:  Anita Reed is a 59 y.o. female with medical history significant for alcoholic cirrhosis, with esophageal varices, paroxysmal A. fib not on anticoagulation, chronic hypotension on midodrine, history of GI bleed, depression and chronic pain, who was brought to the emergency room due to concern for confusion at home.      Assessment/Plan:   Active Problems:   Alcoholic cirrhosis of liver with ascites (HCC)    Alcoholic Cirrhosis of liver without ascites (HCC)   History of GI bleed   Underweight   Falls, initial encounter   Acute hepatic encephalopathy   Traumatic compression fracture of L1 lumbar vertebra (HCC)   Elevated troponin   Leucocytosis   AF (paroxysmal atrial fibrillation) (HCC)   Acute hepatic encephalopathy   Alcoholic cirrhosis of liver with ascites ([place]) Mental status has improved.  Continue lactulose.  Lasix is still on hold.  Hypernatremia Improved.  Continue IV fluids  Hypotension, dizziness She complained of dizziness.  Change IV half-normal saline with KCl to normal saline with KCl.  No evidence of orthostatic hypotension at this time but suspect orthostatic hypotension or peripheral neuropathy may be causing recurrent falls at home.  One dose of fludrocortisone ordered today.  Continue midodrine  Dysphagia  Speech therapist recommended dysphagia 1 diet. -EGD in October 2020 showed mild portal hypertensive gastropathy and grade 2 varices in the middlethird and lower third of the esophagus    Falls, subsequent encounter   Traumatic compression fracture of L1 lumbar vertebra, subacute ([place]) -Patient had a couple falls at home related to her confusion with chest x-ray showing subacute L1 vertebral compression fracture -No evidence of lower extremity  neurologic deficit Analgesics as needed for pain.  PT evaluation  Abdominal pain and bilateral leg pain (peripheral neuropathy) There is no swelling of the abdomen or lower extremities.  She attributes leg pain to neuropathy.  She said abdominal pain started after falls at home.  Analgesics as needed for pain.  Continue to monitor.  Hypokalemia Improved.  Continue potassium repletion. -   Leucocytosis -Chest x-ray suggesting airspace disease on the left. -Procalcitonin less than 0.1. Covid and flu test negative -Low suspicion for pneumonia Urinalysis suggestive of pyuria but no urinary symptoms.  Urine culture is pending.  No antibiotics for now.  Elevated troponin -Troponin 46 >38. Probably demand ischemia -No chest pain and EKG is nonacute -Continue to monitor  Paroxysmal atrial fibrillation ([place]) -Patient currently in sinus rhythm -Continue digoxin. Not currently on systemic anticoagulation -Not currently on aspirin reason uncertain    History of GI bleed H&H is stable.   Body mass index is 19.89 kg/m.  (Underweight)  Diet Order            DIET - DYS 1 Room service appropriate? Yes; Fluid consistency: Nectar Thick  Diet effective now                    Consultants:  None  Procedures:  None    Medications:   . digoxin  125 mcg Oral Daily  . enoxaparin (LOVENOX) injection  40 mg Subcutaneous Q24H  . FLUoxetine  20 mg Oral Daily  . gabapentin  400 mg Oral TID  . lactulose  30 g Oral Once  . lactulose  30 g Oral  TID  . midodrine  10 mg Oral TID WC  . pantoprazole (PROTONIX) IV  40 mg Intravenous Q24H  . potassium chloride  10 mEq Oral Daily   Continuous Infusions:    Anti-infectives (From admission, onward)   None             Family Communication/Anticipated D/C date and plan/Code Status   DVT prophylaxis: enoxaparin (LOVENOX) injection 40 mg Start: 07/29/20 2100     Code Status: Full Code  Family Communication:  None Disposition Plan:    Status is: Inpatient  Remains inpatient appropriate because:Inpatient level of care appropriate due to severity of illness   Dispo: The patient is from: Home              Anticipated d/c is to: Home              Anticipated d/c date is: 2 days              Patient currently is not medically stable to d/c.           Subjective:   C/o dizziness and fatigue.  She said she normally feels dizzy on standing.  Objective:    Vitals:   08/01/20 0032 08/01/20 0457 08/01/20 0833 08/01/20 0834  BP: 117/84 (!) 103/59 (!) 89/57 (!) 89/57  Pulse: 86 99 96 92  Resp:  20 20 16   Temp: 97.7 F (36.5 C) (!) 97.3 F (36.3 C) 97.6 F (36.4 C) 97.6 F (36.4 C)  TempSrc: Oral Axillary Oral   SpO2: 98% 92% 100%   Weight:      Height:       No data found.   Intake/Output Summary (Last 24 hours) at 08/01/2020 0912 Last data filed at 08/01/2020 0521 Gross per 24 hour  Intake 2112.28 ml  Output --  Net 2112.28 ml   Filed Weights   07/29/20 1538 07/30/20 0007  Weight: 56.7 kg 61.1 kg    Exam:  GEN: NAD SKIN: No rash EYES: EOMI ENT: MMM CV: RRR PULM: CTA B ABD: soft, ND, NT, +BS CNS: AAO x 3, non focal EXT: No edema or tenderness    Data Reviewed:   I have personally reviewed following labs and imaging studies:  Labs: Labs show the following:   Basic Metabolic Panel: Recent Labs  Lab 07/29/20 1541 07/29/20 1541 07/30/20 0543 07/30/20 0543 07/31/20 0614 08/01/20 0459  NA 145  --  146*  --  148* 145  K 3.4*   < > 3.3*   < > 3.1* 3.6  CL 106  --  108  --  109 107  CO2 26  --  26  --  27 26  GLUCOSE 109*  --  77  --  82 107*  BUN 14  --  12  --  9 7  CREATININE 0.54  --  0.43*  --  0.55 0.44  CALCIUM 9.0  --  8.9  --  8.9 8.4*  MG  --   --  1.8  --   --   --   PHOS  --   --  3.4  --   --   --    < > = values in this interval not displayed.   GFR Estimated Creatinine Clearance: 73.9 mL/min (by C-G formula based on SCr of 0.44  mg/dL). Liver Function Tests: Recent Labs  Lab 07/29/20 1800 07/30/20 0543  AST 68* 62*  ALT 37 31  ALKPHOS 221* 209*  BILITOT 5.7*  6.2*  PROT 6.0* 5.7*  ALBUMIN 3.2* 3.1*   No results for input(s): LIPASE, AMYLASE in the last 168 hours. Recent Labs  Lab 07/29/20 1542  AMMONIA 46*   Coagulation profile No results for input(s): INR, PROTIME in the last 168 hours.  CBC: Recent Labs  Lab 07/29/20 1541 07/30/20 0543 07/31/20 0614 08/01/20 0459  WBC 14.2* 14.6* 12.2* 14.7*  NEUTROABS  --   --  7.5 8.8*  HGB 14.4 13.9 15.5* 14.4  HCT 41.7 41.7 46.0 43.5  MCV 95.6 96.3 97.3 98.2  PLT 178 170 155 161   Cardiac Enzymes: No results for input(s): CKTOTAL, CKMB, CKMBINDEX, TROPONINI in the last 168 hours. BNP (last 3 results) No results for input(s): PROBNP in the last 8760 hours. CBG: No results for input(s): GLUCAP in the last 168 hours. D-Dimer: No results for input(s): DDIMER in the last 72 hours. Hgb A1c: No results for input(s): HGBA1C in the last 72 hours. Lipid Profile: No results for input(s): CHOL, HDL, LDLCALC, TRIG, CHOLHDL, LDLDIRECT in the last 72 hours. Thyroid function studies: No results for input(s): TSH, T4TOTAL, T3FREE, THYROIDAB in the last 72 hours.  Invalid input(s): FREET3 Anemia work up: No results for input(s): VITAMINB12, FOLATE, FERRITIN, TIBC, IRON, RETICCTPCT in the last 72 hours. Sepsis Labs: Recent Labs  Lab 07/29/20 1541 07/30/20 0543 07/31/20 0614 08/01/20 0459  PROCALCITON <0.10  --   --   --   WBC 14.2* 14.6* 12.2* 14.7*    Microbiology Recent Results (from the past 240 hour(s))  Resp Panel by RT-PCR (Flu A&B, Covid) Nasopharyngeal Swab     Status: None   Collection Time: 07/29/20  6:48 PM   Specimen: Nasopharyngeal Swab; Nasopharyngeal(NP) swabs in vial transport medium  Result Value Ref Range Status   SARS Coronavirus 2 by RT PCR NEGATIVE NEGATIVE Final    Comment: (NOTE) SARS-CoV-2 target nucleic acids are NOT  DETECTED.  The SARS-CoV-2 RNA is generally detectable in upper respiratory specimens during the acute phase of infection. The lowest concentration of SARS-CoV-2 viral copies this assay can detect is 138 copies/mL. A negative result does not preclude SARS-Cov-2 infection and should not be used as the sole basis for treatment or other patient management decisions. A negative result may occur with  improper specimen collection/handling, submission of specimen other than nasopharyngeal swab, presence of viral mutation(s) within the areas targeted by this assay, and inadequate number of viral copies(<138 copies/mL). A negative result must be combined with clinical observations, patient history, and epidemiological information. The expected result is Negative.  Fact Sheet for Patients:  EntrepreneurPulse.com.au  Fact Sheet for Healthcare Providers:  IncredibleEmployment.be  This test is no t yet approved or cleared by the [place] FDA and  has been authorized for detection and/or diagnosis of SARS-CoV-2 by FDA under an Emergency Use Authorization (EUA). This EUA will remain  in effect (meaning this test can be used) for the duration of the COVID-19 declaration under Section 564(b)(1) of the Act, 21 U.S.C.section 360bbb-3(b)(1), unless the authorization is terminated  or revoked sooner.       Influenza A by PCR NEGATIVE NEGATIVE Final   Influenza B by PCR NEGATIVE NEGATIVE Final    Comment: (NOTE) The Xpert Xpress SARS-CoV-2/FLU/RSV plus assay is intended as an aid in the diagnosis of influenza from Nasopharyngeal swab specimens and should not be used as a sole basis for treatment. Nasal washings and aspirates are unacceptable for Xpert Xpress SARS-CoV-2/FLU/RSV testing.  Fact Sheet for Patients: EntrepreneurPulse.com.au  Fact Sheet for Healthcare Providers: IncredibleEmployment.be  This test is not yet  approved or cleared by the [place] FDA and has been authorized for detection and/or diagnosis of SARS-CoV-2 by FDA under an Emergency Use Authorization (EUA). This EUA will remain in effect (meaning this test can be used) for the duration of the COVID-19 declaration under Section 564(b)(1) of the Act, 21 U.S.C. section 360bbb-3(b)(1), unless the authorization is terminated or revoked.  Performed at [facility], [address] [place], [place] [address]   Urine Culture     Status: Abnormal   Collection Time: 07/30/20  5:27 PM   Specimen: Urine, Random  Result Value Ref Range Status   Specimen Description   Final    URINE, RANDOM Performed at [facility], [address] [place], [place] [address]    Special Requests   Final    NONE Performed at [facility], [address] [place], [place] [address]    Culture (A)  Final    <10,000 COLONIES/mL INSIGNIFICANT GROWTH Performed at [facility], [place] [address], [place], [place] [address]    Report Status 08/01/2020 FINAL  Final    Procedures and diagnostic studies:  No results found.             LOS: 3 days   Cortasia Screws  [facility]   Pager on [email or web address]. If 7PM-7AM, please contact night-coverage at [email or web address]     08/01/2020, 9:12 AM

## 2020-08-02 ENCOUNTER — Inpatient Hospital Stay: Payer: Medicare Other

## 2020-08-02 DIAGNOSIS — K7030 Alcoholic cirrhosis of liver without ascites: Secondary | ICD-10-CM

## 2020-08-02 LAB — CBC WITH DIFFERENTIAL/PLATELET
Abs Immature Granulocytes: 0.13 K/uL — ABNORMAL HIGH (ref 0.00–0.07)
Basophils Absolute: 0.1 K/uL (ref 0.0–0.1)
Basophils Relative: 1 %
Eosinophils Absolute: 0.5 K/uL (ref 0.0–0.5)
Eosinophils Relative: 3 %
HCT: 43.9 % (ref 36.0–46.0)
Hemoglobin: 14.1 g/dL (ref 12.0–15.0)
Immature Granulocytes: 1 %
Lymphocytes Relative: 21 %
Lymphs Abs: 3.1 K/uL (ref 0.7–4.0)
MCH: 32.1 pg (ref 26.0–34.0)
MCHC: 32.1 g/dL (ref 30.0–36.0)
MCV: 100 fL (ref 80.0–100.0)
Monocytes Absolute: 2.4 K/uL — ABNORMAL HIGH (ref 0.1–1.0)
Monocytes Relative: 16 %
Neutro Abs: 8.6 K/uL — ABNORMAL HIGH (ref 1.7–7.7)
Neutrophils Relative %: 58 %
Platelets: 140 K/uL — ABNORMAL LOW (ref 150–400)
RBC: 4.39 MIL/uL (ref 3.87–5.11)
RDW: 17.2 % — ABNORMAL HIGH (ref 11.5–15.5)
WBC: 14.8 K/uL — ABNORMAL HIGH (ref 4.0–10.5)
nRBC: 0 % (ref 0.0–0.2)

## 2020-08-02 LAB — BASIC METABOLIC PANEL
Anion gap: 8 (ref 5–15)
BUN: 7 mg/dL (ref 6–20)
CO2: 25 mmol/L (ref 22–32)
Calcium: 8.7 mg/dL — ABNORMAL LOW (ref 8.9–10.3)
Chloride: 111 mmol/L (ref 98–111)
Creatinine, Ser: 0.36 mg/dL — ABNORMAL LOW (ref 0.44–1.00)
GFR, Estimated: >60 mL/min (ref >60)
Glucose, Bld: 97 mg/dL (ref 70–99)
Potassium: 4.1 mmol/L (ref 3.5–5.1)
Sodium: 144 mmol/L (ref 135–145)

## 2020-08-02 MED ORDER — MECLIZINE HCL 12.5 MG PO TABS
12.5000 mg | ORAL_TABLET | Freq: Three times a day (TID) | ORAL | Status: DC
  Administered 2020-08-02 – 2020-08-06 (×12): 12.5 mg via ORAL
  Filled 2020-08-02 (×14): qty 1

## 2020-08-02 NOTE — Progress Notes (Signed)
Progress Note    Teniola Tseng  BVQ:[id number] DOB: 15-Sep-1960  DOA: 07/29/2020 PCP: Towanda Malkin, MD      Brief Narrative:    Medical records reviewed and are as summarized below:  Anita Reed is a 59 y.o. female with medical history significant for alcoholic cirrhosis, with esophageal varices, paroxysmal A. fib not on anticoagulation, chronic hypotension on midodrine, history of GI bleed, depression and chronic pain, who was brought to the emergency room due to concern for confusion at home.      Assessment/Plan:   Active Problems:   Alcoholic cirrhosis of liver with ascites (HCC)    Alcoholic Cirrhosis of liver without ascites (HCC)   History of GI bleed   Underweight   Falls, initial encounter   Acute hepatic encephalopathy   Traumatic compression fracture of L1 lumbar vertebra (HCC)   Elevated troponin   Leucocytosis   AF (paroxysmal atrial fibrillation) (HCC)   Acute hepatic encephalopathy   Alcoholic cirrhosis of liver with ascites ([place]) Mental status has improved.  Continue lactulose.  Lasix is still on hold.  Hypernatremia Improved.  Discontinue IV fluids.  Hypotension, dizziness Blood pressure is better.  No evidence of orthostatic hypotension.  MRI brain with and without contrast was ordered for further evaluation of dizziness that patient could not tolerate MRI.  She said she started dizziness for about 4 months now.  Outpatient follow-up with urologist as recommended.  Continue midodrine.  Trial of meclizine.   Dysphagia  Speech therapist recommended dysphagia 1 diet. -EGD in October 2020 showed mild portal hypertensive gastropathy and grade 2 varices in the middlethird and lower third of the esophagus    Falls, subsequent encounter   Traumatic compression fracture of L1 lumbar vertebra, subacute ([place]) -Patient had a couple falls at home related to her confusion with chest x-ray showing subacute L1 vertebral compression  fracture -No evidence of lower extremity neurologic deficit Analgesics as needed for pain.  PT evaluation is pending.  Abdominal pain and bilateral leg pain (peripheral neuropathy) She said her abdominal pain is usually from abdominal cramps from diarrhea.  She attributes leg pain to neuropathy.  Analgesics as needed for pain.  Hypokalemia Improved.  Discontinue IV potassium and continue oral potassium repletion. -   Leucocytosis This appears to be chronic. -Chest x-ray suggesting airspace disease on the left. -Procalcitonin less than 0.1. Covid and flu test negative -Low suspicion for pneumonia Urinalysis suggestive of pyuria but no urinary symptoms.  No significant growth on urine culture.  No indication for antibiotics.  Elevated troponin -Troponin 46 >38. Probably demand ischemia -No chest pain and EKG is nonacute -Continue to monitor  Paroxysmal atrial fibrillation ([place]) -Patient currently in sinus rhythm -Continue digoxin. Not currently on systemic anticoagulation -Not currently on aspirin reason uncertain    History of GI bleed H&H is stable.   Body mass index is 19.89 kg/m.  (Underweight)  Diet Order            DIET - DYS 1 Room service appropriate? Yes; Fluid consistency: Nectar Thick  Diet effective now                    Consultants:  None  Procedures:  None    Medications:   . digoxin  125 mcg Oral Daily  . enoxaparin (LOVENOX) injection  40 mg Subcutaneous Q24H  . fludrocortisone  0.05 mg Oral Daily  . FLUoxetine  20 mg Oral Daily  . gabapentin  400 mg  Oral TID  . lactulose  30 g Oral Once  . lactulose  30 g Oral TID  . midodrine  10 mg Oral TID WC  . pantoprazole (PROTONIX) IV  40 mg Intravenous Q24H  . potassium chloride  10 mEq Oral Daily   Continuous Infusions:    Anti-infectives (From admission, onward)   None             Family Communication/Anticipated D/C date and plan/Code Status   DVT prophylaxis:  enoxaparin (LOVENOX) injection 40 mg Start: 07/29/20 2100     Code Status: Full Code  Family Communication: None Disposition Plan:    Status is: Inpatient  Remains inpatient appropriate because:Inpatient level of care appropriate due to severity of illness   Dispo: The patient is from: Home              Anticipated d/c is to: Home              Anticipated d/c date is: 2 days              Patient currently is not medically stable to d/c.           Subjective:   C/o dizziness.  She has loose stools from lactulose and this is associated with abdominal cramping.  Objective:    Vitals:   08/02/20 1000 08/02/20 1001 08/02/20 1006 08/02/20 1140  BP: 104/69 106/67 107/72 113/65  Pulse: 98 99 94 96  Resp: 20   (!) 24  Temp:    98.3 F (36.8 C)  TempSrc:    Oral  SpO2: 95% 100% 95% 95%  Weight:      Height:       No data found.   Intake/Output Summary (Last 24 hours) at 08/02/2020 1301 Last data filed at 08/02/2020 0343 Gross per 24 hour  Intake 1322.37 ml  Output --  Net 1322.37 ml   Filed Weights   07/29/20 1538 07/30/20 0007  Weight: 56.7 kg 61.1 kg    Exam:  GEN: NAD SKIN: No rash EYES: EOMI ENT: MMM CV: RRR PULM: CTA B ABD: soft, ND, NT, +BS CNS: AAO x 3, non focal EXT: No edema or tenderness     Data Reviewed:   I have personally reviewed following labs and imaging studies:  Labs: Labs show the following:   Basic Metabolic Panel: Recent Labs  Lab 07/29/20 1541 07/29/20 1541 07/30/20 0543 07/30/20 0543 07/31/20 0614 07/31/20 0614 08/01/20 0459 08/02/20 1035  NA 145  --  146*  --  148*  --  145 144  K 3.4*   < > 3.3*   < > 3.1*   < > 3.6 4.1  CL 106  --  108  --  109  --  107 111  CO2 26  --  26  --  27  --  26 25  GLUCOSE 109*  --  77  --  82  --  107* 97  BUN 14  --  12  --  9  --  7 7  CREATININE 0.54  --  0.43*  --  0.55  --  0.44 0.36*  CALCIUM 9.0  --  8.9  --  8.9  --  8.4* 8.7*  MG  --   --  1.8  --   --   --   --    --   PHOS  --   --  3.4  --   --   --   --   --    < > =  values in this interval not displayed.   GFR Estimated Creatinine Clearance: 73.9 mL/min (A) (by C-G formula based on SCr of 0.36 mg/dL (L)). Liver Function Tests: Recent Labs  Lab 07/29/20 1800 07/30/20 0543  AST 68* 62*  ALT 37 31  ALKPHOS 221* 209*  BILITOT 5.7* 6.2*  PROT 6.0* 5.7*  ALBUMIN 3.2* 3.1*   No results for input(s): LIPASE, AMYLASE in the last 168 hours. Recent Labs  Lab 07/29/20 1542  AMMONIA 46*   Coagulation profile No results for input(s): INR, PROTIME in the last 168 hours.  CBC: Recent Labs  Lab 07/29/20 1541 07/30/20 0543 07/31/20 0614 08/01/20 0459 08/02/20 1035  WBC 14.2* 14.6* 12.2* 14.7* 14.8*  NEUTROABS  --   --  7.5 8.8* 8.6*  HGB 14.4 13.9 15.5* 14.4 14.1  HCT 41.7 41.7 46.0 43.5 43.9  MCV 95.6 96.3 97.3 98.2 100.0  PLT 178 170 155 161 140*   Cardiac Enzymes: No results for input(s): CKTOTAL, CKMB, CKMBINDEX, TROPONINI in the last 168 hours. BNP (last 3 results) No results for input(s): PROBNP in the last 8760 hours. CBG: No results for input(s): GLUCAP in the last 168 hours. D-Dimer: No results for input(s): DDIMER in the last 72 hours. Hgb A1c: No results for input(s): HGBA1C in the last 72 hours. Lipid Profile: No results for input(s): CHOL, HDL, LDLCALC, TRIG, CHOLHDL, LDLDIRECT in the last 72 hours. Thyroid function studies: No results for input(s): TSH, T4TOTAL, T3FREE, THYROIDAB in the last 72 hours.  Invalid input(s): FREET3 Anemia work up: No results for input(s): VITAMINB12, FOLATE, FERRITIN, TIBC, IRON, RETICCTPCT in the last 72 hours. Sepsis Labs: Recent Labs  Lab 07/29/20 1541 07/29/20 1541 07/30/20 0543 07/31/20 0614 08/01/20 0459 08/02/20 1035  PROCALCITON <0.10  --   --   --   --   --   WBC 14.2*   < > 14.6* 12.2* 14.7* 14.8*   < > = values in this interval not displayed.    Microbiology Recent Results (from the past 240 hour(s))  Resp  Panel by RT-PCR (Flu A&B, Covid) Nasopharyngeal Swab     Status: None   Collection Time: 07/29/20  6:48 PM   Specimen: Nasopharyngeal Swab; Nasopharyngeal(NP) swabs in vial transport medium  Result Value Ref Range Status   SARS Coronavirus 2 by RT PCR NEGATIVE NEGATIVE Final    Comment: (NOTE) SARS-CoV-2 target nucleic acids are NOT DETECTED.  The SARS-CoV-2 RNA is generally detectable in upper respiratory specimens during the acute phase of infection. The lowest concentration of SARS-CoV-2 viral copies this assay can detect is 138 copies/mL. A negative result does not preclude SARS-Cov-2 infection and should not be used as the sole basis for treatment or other patient management decisions. A negative result may occur with  improper specimen collection/handling, submission of specimen other than nasopharyngeal swab, presence of viral mutation(s) within the areas targeted by this assay, and inadequate number of viral copies(<138 copies/mL). A negative result must be combined with clinical observations, patient history, and epidemiological information. The expected result is Negative.  Fact Sheet for Patients:  EntrepreneurPulse.com.au  Fact Sheet for Healthcare Providers:  IncredibleEmployment.be  This test is no t yet approved or cleared by the [place] FDA and  has been authorized for detection and/or diagnosis of SARS-CoV-2 by FDA under an Emergency Use Authorization (EUA). This EUA will remain  in effect (meaning this test can be used) for the duration of the COVID-19 declaration under Section 564(b)(1) of the Act, 21 U.S.C.section 360bbb-3(b)(1),  unless the authorization is terminated  or revoked sooner.       Influenza A by PCR NEGATIVE NEGATIVE Final   Influenza B by PCR NEGATIVE NEGATIVE Final    Comment: (NOTE) The Xpert Xpress SARS-CoV-2/FLU/RSV plus assay is intended as an aid in the diagnosis of influenza from Nasopharyngeal  swab specimens and should not be used as a sole basis for treatment. Nasal washings and aspirates are unacceptable for Xpert Xpress SARS-CoV-2/FLU/RSV testing.  Fact Sheet for Patients: EntrepreneurPulse.com.au  Fact Sheet for Healthcare Providers: IncredibleEmployment.be  This test is not yet approved or cleared by the [place] FDA and has been authorized for detection and/or diagnosis of SARS-CoV-2 by FDA under an Emergency Use Authorization (EUA). This EUA will remain in effect (meaning this test can be used) for the duration of the COVID-19 declaration under Section 564(b)(1) of the Act, 21 U.S.C. section 360bbb-3(b)(1), unless the authorization is terminated or revoked.  Performed at [facility], [address] [place], [place] [address]   Urine Culture     Status: Abnormal   Collection Time: 07/30/20  5:27 PM   Specimen: Urine, Random  Result Value Ref Range Status   Specimen Description   Final    URINE, RANDOM Performed at [facility], [address], [place], [place] [address]    Special Requests   Final    NONE Performed at [facility], [address] [place], [place] [address]    Culture (A)  Final    <10,000 COLONIES/mL INSIGNIFICANT GROWTH Performed at [facility], [place] [address], [place], [place] [address]    Report Status 08/01/2020 FINAL  Final    Procedures and diagnostic studies:  No results found.             LOS: 4 days   Luster Hechler  [facility]   Pager on [email or web address]. If 7PM-7AM, please contact night-coverage at [email or web address]     08/02/2020, 1:01 PM

## 2020-08-02 NOTE — Treatment Plan (Signed)
MRI failed per tech at this time as patient would not cooperate with MRI procedures. MD Mal Misty made aware.

## 2020-08-02 NOTE — Evaluation (Addendum)
Physical Therapy Evaluation Patient Details Name: Anita Reed MRN: [id number] DOB: 22-Feb-1961 Today's Date: 08/02/2020   History of Present Illness  Pt is a 59 y/o F admitted on 07/29/20 with c/c of confusion. Pt has also had a couple of falls. Pt currently being treated for acute hepatic encephalopathy, alcoholic cirrhosis of liver with ascites. Pt also found to have a subaute L1 compression fx. PMH: alcoholic cirrhosis, paroxysmal a-fib not on anticoagulation, chronic hypotension on midodrine, GI bleed, depression, chronic pain, lymphoma, a-flutter, hepatitis, hiatal hernia  Clinical Impression  Pt received in bed reporting "I need to pee. I've been waiting for an hour and a half.". Pt completes bed mobility with supervision with hospital bed features & stand pivot with min/mod assist with cuing for technique & sequencing. Pt with continent BM & void on toilet, performing peri hygiene without assistance. Pt is able to stand to perform hand hygiene with min assist. Pt ambulates short distance in room with RW & min/mod assist with decreased coordination & observable impaired strength LLE. Pt elects to ambulate with L heel off of ground but is able to place LLE flat in sitting. Pt with noticeable BLE weakness & impaired balance. Pt speaks of dizzy spells during session, reporting "I knew they'd get me eventually". Pt with intermittent c/o dizziness during session, BP checked, see below. Pt may benefit from orthostatic vital sign assessment during next session as pt c/o fatigue by end of session. Will continue to follow pt acutely to address impaired balance, strength, endurance, gait with LRAD & safety awareness. Recommending STR following d/c as pt will need 24 hr assist & pt's husband works. Also feel pt will benefit from continued skilled PT treatment to optimize independence with functional mobility prior to return home.     Follow Up Recommendations SNF;Supervision/Assistance - 24 hour;Supervision  for mobility/OOB    Equipment Recommendations  Rolling walker with 5" wheels    Recommendations for Other Services   OT consult    Precautions / Restrictions Precautions Precautions: Fall Restrictions Weight Bearing Restrictions: No      Mobility  Bed Mobility Overal bed mobility: Needs Assistance Bed Mobility: Supine to Sit;Sit to Supine     Supine to sit: Supervision;HOB elevated Sit to supine: Supervision;HOB elevated        Transfers Overall transfer level: Needs assistance   Transfers: Sit to/from Stand;Stand Pivot Transfers Sit to Stand: Min assist;Mod assist Stand pivot transfers: Min assist;Mod assist (stand pivot bed>BSC without AD with cuing for hand placement & sequencing)          Ambulation/Gait Ambulation/Gait assistance: Min assist;Mod assist Gait Distance (Feet): 8 Feet Assistive device: Rolling walker (2 wheeled) Gait Pattern/deviations: Decreased step length - left;Decreased stance time - left;Decreased stride length;Decreased dorsiflexion - left;Decreased weight shift to left Gait velocity: decreased   General Gait Details: pt ambulates on forefront of L foot, holding heel off of ground, LLE observable weakness noted  Stairs            Wheelchair Mobility    Modified Rankin (Stroke Patients Only)       Balance Overall balance assessment: Needs assistance Sitting-balance support: Bilateral upper extremity supported;Feet supported Sitting balance-Leahy Scale: Good Sitting balance - Comments: sitting on BSC with supervision   Standing balance support: Bilateral upper extremity supported;During functional activity Standing balance-Leahy Scale: Poor Standing balance comment: pt requires BUE support for static standing balance, min/mod assist for gait with RW  Pertinent Vitals/Pain Pain Assessment: 0-10 Pain Score:  ("A ten! A twelve!") Pain Location: low back Pain Descriptors /  Indicators: Aching;Discomfort Pain Intervention(s): Patient requesting pain meds-RN notified;Monitored during session    [place] expects to be discharged to:: Private residence Living Arrangements: Spouse/significant other Available Help at Discharge: Family;Available PRN/intermittently (husband works during the day) Type of Home: House Home Access: Stairs to enter Entrance Stairs-Rails: Horticulturist, commercial of Steps: 4 Home Layout: One level Home Equipment: [place] - single point      Prior Function Level of Independence: Independent         Comments: intermittent use of SPC     Hand Dominance        Extremity/Trunk Assessment   Upper Extremity Assessment Upper Extremity Assessment: Generalized weakness    Lower Extremity Assessment Lower Extremity Assessment: Generalized weakness (pt with decreased ability to follow instructions for formal MMT testing)       Communication   Communication: No difficulties  Cognition Arousal/Alertness: Awake/alert Behavior During Therapy: WFL for tasks assessed/performed Overall Cognitive Status: Difficult to assess                                 General Comments: Pt oriented to location, situation, date.      General Comments General comments (skin integrity, edema, etc.): Pt reports intermittent dizziness during session, but unable to discern what provokes it or improves sx. BP at end of session 103/56 mmHg. Pt on room air throughout session & SpO2 >90% throughout.    Exercises     Assessment/Plan    PT Assessment Patient needs continued PT services  PT Problem List Decreased strength;Decreased balance;Pain;Decreased knowledge of use of DME;Decreased mobility;Decreased activity tolerance;Decreased coordination;Decreased safety awareness       PT Treatment Interventions      PT Goals (Current goals can be found in the Care Plan section)  Acute Rehab PT Goals Patient Stated Goal:  feel better PT Goal Formulation: With patient Time For Goal Achievement: 08/16/20 Potential to Achieve Goals: Good    Frequency Min 2X/week   Barriers to discharge Decreased caregiver support husband works during the day    Co-evaluation               AM-PAC PT "6 Clicks" Mobility  Outcome Measure Help needed turning from your back to your side while in a flat bed without using bedrails?: A Little Help needed moving from lying on your back to sitting on the side of a flat bed without using bedrails?: A Little Help needed moving to and from a bed to a chair (including a wheelchair)?: A Little Help needed standing up from a chair using your arms (e.g., wheelchair or bedside chair)?: A Little Help needed to walk in hospital room?: A Lot Help needed climbing 3-5 steps with a railing? : A Lot 6 Click Score: 16    End of Session Equipment Utilized During Treatment: Gait belt Activity Tolerance: Patient limited by fatigue;Patient tolerated treatment well Patient left: in bed;with bed alarm set;with call bell/phone within reach Nurse Communication: Mobility status PT Visit Diagnosis: Unsteadiness on feet (R26.81);Muscle weakness (generalized) (M62.81);Difficulty in walking, not elsewhere classified (R26.2);History of falling (Z91.81)    Time: 2025-4270 PT Time Calculation (min) (ACUTE ONLY): 33 min   Charges:   PT Evaluation $PT Eval Moderate Complexity: 1 Mod PT Treatments $Therapeutic Activity: 23-37 mins        Lavone Nian,  PT, DPT 08/02/20, 3:59 PM   Waunita Schooner 08/02/2020, 3:55 PM

## 2020-08-03 MED ORDER — PANTOPRAZOLE SODIUM 40 MG PO TBEC
40.0000 mg | DELAYED_RELEASE_TABLET | Freq: Every day | ORAL | Status: DC
  Administered 2020-08-03 – 2020-08-06 (×4): 40 mg via ORAL
  Filled 2020-08-03 (×4): qty 1

## 2020-08-03 NOTE — TOC Initial Note (Signed)
Transition of Care [facility]) - Initial/Assessment Note    Patient Details  Name: Anita Reed MRN: [id number] Date of Birth: 1961/04/08  Transition of Care [facility]) CM/SW Contact:    Harriet Masson, RN Phone Number: [phone] 08/03/2020, 4:38 PM  Clinical Narrative:                 PT re: SNF and OT re: [facility]. RN spoke with pt who declined SNF placement for ongoing rehabilitation however receptive to [facility]. Choice provided and pt receptive to [place] (history) for PT/OT. [facility] has excepted services for this pt upon her discharge. [employer] [facility]on Grills) for DME 3-1 commode and rolling walker. Pt confirms she has the ability to obtain all her medications post discharge, support in the home from her spouse [name]) who she has refused for me to contact concerning her discharged needs and verified she has sufficient transportation to all her medical appointments and pick-up from the hospital.   [facility] team will continue to follow for any additional needs.     Expected Discharge Plan: [place] Barriers to Discharge: No Barriers Identified   Patient Goals and CMS Choice Patient states their goals for this hospitalization and ongoing recovery are:: I want to go home   Choice offered to / list presented to : Patient  Expected Discharge Plan and Services Expected Discharge Plan: [place] In-house Referral: Clinical Social Work   Post Acute Care Choice: [place] arrangements for the past 2 months: [place]                 DME Arranged: Bedside commode, Walker rolling DME Agency: Other - Comment Physicist, medical Brenton Grills)) Date DME Agency Contacted: 08/03/20 Time DME Agency Contacted: [phone]   [place], PT, OT HH Agency: [place] ([place]) Date Falls: 08/03/20 Time [place]: 1624 Representative spoke with at [place]: Corene Cornea  Prior Living  Arrangements/Services Living arrangements for the past 2 months: [place] with:: Spouse Patient language and need for interpreter reviewed:: Yes Do you feel safe going back to the place where you live?: Yes      Need for Family Participation in Patient Care: No (Comment) Care giver support system in place?: Yes (comment)   Criminal Activity/Legal Involvement Pertinent to Current Situation/Hospitalization: No - Comment as needed  Activities of Daily Living      Permission Sought/Granted   Permission granted to share information with : Yes, Verbal Permission Granted              Emotional Assessment Appearance:: Appears older than stated age Attitude/Demeanor/Rapport: Engaged Affect (typically observed): Accepting Orientation: : Oriented to Self, Oriented to Place, Oriented to Situation Alcohol / Substance Use: Alcohol Use Psych Involvement: No (comment)  Admission diagnosis:  Confusion [R41.0] Hyperammonemia (HCC) [E72.20] Acute hepatic encephalopathy [K72.00] Compression fracture of lumbar vertebra, unspecified lumbar vertebral level, initial encounter [facility]) [S32.000A] Patient Active Problem List   Diagnosis Date Noted  . History of GI bleed 07/29/2020  . Underweight 07/29/2020  . Falls, initial encounter 07/29/2020  . Acute hepatic encephalopathy 07/29/2020  . Traumatic compression fracture of L1 lumbar vertebra ([place]) 07/29/2020  . Elevated troponin 07/29/2020  . Leucocytosis 07/29/2020  . AF (paroxysmal atrial fibrillation) ([place]) 07/29/2020  . Septic shock ([place]) 05/31/2020  . Healthcare-associated pneumonia   .  Alcoholic Cirrhosis of liver without ascites ([place])   . Epigastric hernia 03/06/2020  . Major depressive disorder  with single episode, in partial remission ([place]) 11/28/2019  . Neuropathy 11/28/2019  . Dysthymia 07/27/2019  . Other chronic pain 07/27/2019  . Muscle spasticity 07/27/2019  . C. difficile colitis   . Hypokalemia 07/15/2019   . Bandemia 07/15/2019  . Hypotension   . Goals of care, counseling/discussion   . Palliative care encounter   . Muscle cramping 06/22/2019  . Alcohol abuse 06/22/2019  . Sepsis ([place]) 06/22/2019  . GIB (gastrointestinal bleeding) 06/01/2019  . GI bleeding 06/01/2019  . History of lymphoma 02/26/2019  . Atrial flutter ([place]) 02/07/2019  . Alcoholic cirrhosis of liver with ascites ([place]) 02/07/2019  . Persistent atrial fibrillation ([place]) 02/07/2019  . Anemia 02/07/2019  . Anorexia 02/07/2019  . GI bleed 01/28/2019  . Ascites 01/18/2019  . Cancer [facility])    PCP:  Towanda Malkin, MD Pharmacy:   [facility] [address], [place] [address] [place] [address] Phone: [phone] Fax: [phone]     Social Determinants of Health (SDOH) Interventions    Readmission Risk Interventions Readmission Risk Prevention Plan 07/18/2019 07/15/2019  Transportation Screening Complete Complete  PCP or Specialist Appt within 3-5 Days - Complete  HRI or [place] - Complete  Social Work Consult for [place] Planning/Counseling - Patient refused  Palliative Care Screening - Complete  Medication Review Press photographer) Complete Complete  PCP or Specialist appointment within 3-5 days of discharge (No Data) -  [place] or Home Care Consult Complete -  Palliative Care Screening Complete -  [place] Not Applicable -  Some recent data might be hidden

## 2020-08-03 NOTE — Progress Notes (Signed)
Progress Note    Anita Reed  ACZ:[id number] DOB: 12-05-1960  DOA: 07/29/2020 PCP: Towanda Malkin, MD      Brief Narrative:    Medical records reviewed and are as summarized below:  Anita Reed is a 59 y.o. female with medical history significant for alcoholic cirrhosis, with esophageal varices, paroxysmal A. fib not on anticoagulation, chronic hypotension on midodrine, history of GI bleed, depression and chronic pain, who was brought to the emergency room due to concern for confusion at home.  She was admitted to the hospital for acute hepatic encephalopathy. She was treated with lactulose. Her mental status improved to baseline. She also developed hypernatremia and was treated with IV fluids. She had hypokalemia that was repleted as well.  She has chronic intermittent dizziness and has been falling at home. She was found to have traumatic compression fracture of L1 vertebra. She was treated with analgesics. PT was consulted and discharge to SNF was recommended.    Assessment/Plan:   Active Problems:   Alcoholic cirrhosis of liver with ascites (HCC)    Alcoholic Cirrhosis of liver without ascites (HCC)   History of GI bleed   Underweight   Falls, initial encounter   Acute hepatic encephalopathy   Traumatic compression fracture of L1 lumbar vertebra (HCC)   Elevated troponin   Leucocytosis   AF (paroxysmal atrial fibrillation) (HCC)   Acute hepatic encephalopathy   Alcoholic cirrhosis of liver with ascites ([place]) Mental status is back to baseline. Continue lactulose. Lasix is still on hold because of low blood pressure. No evidence of peripheral edema at this time.  Hypernatremia Improved.  Hypotension, dizziness Intermittent low blood pressure. No evidence of orthostatic hypotension. MRI brain ordered on 08/02/2020 was not completed because patient could not tolerate it. She said she has been dealing with dizziness for about 4 months now.  Outpatient  follow-up with neurologist as recommended.  Continue midodrine and meclizine (started on 08/02/2020)  Dysphagia  Speech therapist recommended dysphagia 1 diet. -EGD in October 2020 showed mild portal hypertensive gastropathy and grade 2 varices in the middlethird and lower third of the esophagus    Falls at home, subsequent encounter   Traumatic compression fracture of L1 lumbar vertebra, subacute (HCC) -No evidence of lower extremity neurologic deficit Analgesics as needed for pain. PT recommended discharge to SNF. Social worker has been consulted to assist with disposition.  Abdominal pain and bilateral leg pain (peripheral neuropathy) She said her abdominal pain is usually from abdominal cramps from diarrhea.  She attributes leg pain to neuropathy.  Analgesics as needed for pain.  Hypokalemia Improved. Continue potassium repletion -   Leucocytosis This appears to be chronic. -Chest x-ray suggesting airspace disease on the left. -Procalcitonin less than 0.1. Covid and flu test negative -Low suspicion for pneumonia Urinalysis suggestive of pyuria but no urinary symptoms.  No significant growth on urine culture.  No indication for antibiotics.  Elevated troponin -Troponin 46 >38. Probably demand ischemia -No chest pain and EKG is nonacute -Continue to monitor  Paroxysmal atrial fibrillation ([place]) -Patient currently in sinus rhythm -Continue digoxin.     History of GI bleed H&H is stable.   Body mass index is 19.89 kg/m.  (Underweight)  Diet Order            DIET - DYS 1 Room service appropriate? Yes; Fluid consistency: Nectar Thick  Diet effective now  Consultants:  None  Procedures:  None    Medications:   . digoxin  125 mcg Oral Daily  . enoxaparin (LOVENOX) injection  40 mg Subcutaneous Q24H  . fludrocortisone  0.05 mg Oral Daily  . FLUoxetine  20 mg Oral Daily  . gabapentin  400 mg Oral TID  . lactulose  30 g Oral Once  .  lactulose  30 g Oral TID  . meclizine  12.5 mg Oral TID  . midodrine  10 mg Oral TID WC  . pantoprazole  40 mg Oral Daily  . potassium chloride  10 mEq Oral Daily   Continuous Infusions:    Anti-infectives (From admission, onward)   None             Family Communication/Anticipated D/C date and plan/Code Status   DVT prophylaxis: enoxaparin (LOVENOX) injection 40 mg Start: 07/29/20 2100     Code Status: Full Code  Family Communication: None Disposition Plan:    Status is: Inpatient  Remains inpatient appropriate because:Inpatient level of care appropriate due to severity of illness   Dispo: The patient is from: Home              Anticipated d/c is to: Home              Anticipated d/c date is: 2 days              Patient currently is not medically stable to d/c.           Subjective:   C/o low back pain. She requested something stronger for pain.  Objective:    Vitals:   08/03/20 0012 08/03/20 0509 08/03/20 0806 08/03/20 1245  BP: 114/72 106/72 (!) 97/59 124/81  Pulse: 96 98 100 100  Resp: 20 20 20 16   Temp: 97.9 F (36.6 C) 97.8 F (36.6 C) 98 F (36.7 C) 97.8 F (36.6 C)  TempSrc:   Oral Oral  SpO2: 91% 96% 98% 91%  Weight:      Height:       No data found.   Intake/Output Summary (Last 24 hours) at 08/03/2020 1412 Last data filed at 08/03/2020 0610 Gross per 24 hour  Intake 236 ml  Output --  Net 236 ml   Filed Weights   07/29/20 1538 07/30/20 0007  Weight: 56.7 kg 61.1 kg    Exam:  GEN: NAD SKIN: No rash EYES: EOMI ENT: MMM CV: RRR PULM: CTA B ABD: soft, ND, NT, +BS CNS: AAO x 3, non focal EXT: No edema or tenderness MSK: Tenderness along the lumbar spine    Data Reviewed:   I have personally reviewed following labs and imaging studies:  Labs: Labs show the following:   Basic Metabolic Panel: Recent Labs  Lab 07/29/20 1541 07/29/20 1541 07/30/20 0543 07/30/20 0543 07/31/20 0614 07/31/20 0614  08/01/20 0459 08/02/20 1035  NA 145  --  146*  --  148*  --  145 144  K 3.4*   < > 3.3*   < > 3.1*   < > 3.6 4.1  CL 106  --  108  --  109  --  107 111  CO2 26  --  26  --  27  --  26 25  GLUCOSE 109*  --  77  --  82  --  107* 97  BUN 14  --  12  --  9  --  7 7  CREATININE 0.54  --  0.43*  --  0.55  --  0.44 0.36*  CALCIUM 9.0  --  8.9  --  8.9  --  8.4* 8.7*  MG  --   --  1.8  --   --   --   --   --   PHOS  --   --  3.4  --   --   --   --   --    < > = values in this interval not displayed.   GFR Estimated Creatinine Clearance: 73.9 mL/min (A) (by C-G formula based on SCr of 0.36 mg/dL (L)). Liver Function Tests: Recent Labs  Lab 07/29/20 1800 07/30/20 0543  AST 68* 62*  ALT 37 31  ALKPHOS 221* 209*  BILITOT 5.7* 6.2*  PROT 6.0* 5.7*  ALBUMIN 3.2* 3.1*   No results for input(s): LIPASE, AMYLASE in the last 168 hours. Recent Labs  Lab 07/29/20 1542  AMMONIA 46*   Coagulation profile No results for input(s): INR, PROTIME in the last 168 hours.  CBC: Recent Labs  Lab 07/29/20 1541 07/30/20 0543 07/31/20 0614 08/01/20 0459 08/02/20 1035  WBC 14.2* 14.6* 12.2* 14.7* 14.8*  NEUTROABS  --   --  7.5 8.8* 8.6*  HGB 14.4 13.9 15.5* 14.4 14.1  HCT 41.7 41.7 46.0 43.5 43.9  MCV 95.6 96.3 97.3 98.2 100.0  PLT 178 170 155 161 140*   Cardiac Enzymes: No results for input(s): CKTOTAL, CKMB, CKMBINDEX, TROPONINI in the last 168 hours. BNP (last 3 results) No results for input(s): PROBNP in the last 8760 hours. CBG: No results for input(s): GLUCAP in the last 168 hours. D-Dimer: No results for input(s): DDIMER in the last 72 hours. Hgb A1c: No results for input(s): HGBA1C in the last 72 hours. Lipid Profile: No results for input(s): CHOL, HDL, LDLCALC, TRIG, CHOLHDL, LDLDIRECT in the last 72 hours. Thyroid function studies: No results for input(s): TSH, T4TOTAL, T3FREE, THYROIDAB in the last 72 hours.  Invalid input(s): FREET3 Anemia work up: No results for  input(s): VITAMINB12, FOLATE, FERRITIN, TIBC, IRON, RETICCTPCT in the last 72 hours. Sepsis Labs: Recent Labs  Lab 07/29/20 1541 07/29/20 1541 07/30/20 0543 07/31/20 0614 08/01/20 0459 08/02/20 1035  PROCALCITON <0.10  --   --   --   --   --   WBC 14.2*   < > 14.6* 12.2* 14.7* 14.8*   < > = values in this interval not displayed.    Microbiology Recent Results (from the past 240 hour(s))  Resp Panel by RT-PCR (Flu A&B, Covid) Nasopharyngeal Swab     Status: None   Collection Time: 07/29/20  6:48 PM   Specimen: Nasopharyngeal Swab; Nasopharyngeal(NP) swabs in vial transport medium  Result Value Ref Range Status   SARS Coronavirus 2 by RT PCR NEGATIVE NEGATIVE Final    Comment: (NOTE) SARS-CoV-2 target nucleic acids are NOT DETECTED.  The SARS-CoV-2 RNA is generally detectable in upper respiratory specimens during the acute phase of infection. The lowest concentration of SARS-CoV-2 viral copies this assay can detect is 138 copies/mL. A negative result does not preclude SARS-Cov-2 infection and should not be used as the sole basis for treatment or other patient management decisions. A negative result may occur with  improper specimen collection/handling, submission of specimen other than nasopharyngeal swab, presence of viral mutation(s) within the areas targeted by this assay, and inadequate number of viral copies(<138 copies/mL). A negative result must be combined with clinical observations, patient history, and epidemiological information. The expected result is Negative.  Fact Sheet  for Patients:  EntrepreneurPulse.com.au  Fact Sheet for Healthcare Providers:  IncredibleEmployment.be  This test is no t yet approved or cleared by the [place] FDA and  has been authorized for detection and/or diagnosis of SARS-CoV-2 by FDA under an Emergency Use Authorization (EUA). This EUA will remain  in effect (meaning this test can be used) for  the duration of the COVID-19 declaration under Section 564(b)(1) of the Act, 21 U.S.C.section 360bbb-3(b)(1), unless the authorization is terminated  or revoked sooner.       Influenza A by PCR NEGATIVE NEGATIVE Final   Influenza B by PCR NEGATIVE NEGATIVE Final    Comment: (NOTE) The Xpert Xpress SARS-CoV-2/FLU/RSV plus assay is intended as an aid in the diagnosis of influenza from Nasopharyngeal swab specimens and should not be used as a sole basis for treatment. Nasal washings and aspirates are unacceptable for Xpert Xpress SARS-CoV-2/FLU/RSV testing.  Fact Sheet for Patients: EntrepreneurPulse.com.au  Fact Sheet for Healthcare Providers: IncredibleEmployment.be  This test is not yet approved or cleared by the [place] FDA and has been authorized for detection and/or diagnosis of SARS-CoV-2 by FDA under an Emergency Use Authorization (EUA). This EUA will remain in effect (meaning this test can be used) for the duration of the COVID-19 declaration under Section 564(b)(1) of the Act, 21 U.S.C. section 360bbb-3(b)(1), unless the authorization is terminated or revoked.  Performed at [facility], [address], [place], [place] [address]   Urine Culture     Status: Abnormal   Collection Time: 07/30/20  5:27 PM   Specimen: Urine, Random  Result Value Ref Range Status   Specimen Description   Final    URINE, RANDOM Performed at [facility], [address] [place], [place] [address]    Special Requests   Final    NONE Performed at [facility], [address] [place], [place] [address]    Culture (A)  Final    <10,000 COLONIES/mL INSIGNIFICANT GROWTH Performed at [facility], [place] [address], [place], [place] [address]    Report Status 08/01/2020 FINAL  Final    Procedures and diagnostic studies:  No results found.             LOS: 5 days   Javana Schey  [facility]    Pager on [email or web address]. If 7PM-7AM, please contact night-coverage at [email or web address]     08/03/2020, 2:12 PM

## 2020-08-03 NOTE — Evaluation (Signed)
Occupational Therapy Evaluation Patient Details Name: Anita Reed MRN: [id number] DOB: [birth year] Today's Date: 08/03/2020    History of Present Illness Pt is a 59 y/o F admitted on 07/29/20 with c/c of confusion. Pt has also had a couple of falls. Pt currently being treated for acute hepatic encephalopathy, alcoholic cirrhosis of liver with ascites. Pt also found to have a subaute L1 compression fx. PMH: alcoholic cirrhosis, paroxysmal a-fib not on anticoagulation, chronic hypotension on midodrine, GI bleed, depression, chronic pain, lymphoma, a-flutter, hepatitis, hiatal hernia   Clinical Impression   Anita Reed was seen for OT evaluation this date. Prior to hospital admission, pt was Independent for mobility and I/ADLs using SPC as needed. Pt lives c husband who works during day. Pt presents to acute OT demonstrating impaired ADL performance and functional mobility 2/2 decreased insight into deficits, functional strength/endurance deficits, and decreased safety awareness. Pt currently requires MIN A + HHA for BSC t/f including perihygiene in standing. CGA + single UE support tooth brushing standing grooming sink side. SUPERVISION only for bed mobility and LB access in sitting. Pt would benefit from skilled OT to address noted impairments and functional limitations (see below for any additional details) in order to maximize safety and independence while minimizing falls risk and caregiver burden. Upon hospital discharge, recommend HHOT to maximize pt safety and return to functional independence during meaningful occupations of daily life.     Follow Up Recommendations  Home health OT;Other (comment) (SUPERVISION for OOB/Mobility)    Equipment Recommendations  3 in 1 bedside commode    Recommendations for Other Services       Precautions / Restrictions Precautions Precautions: Fall Restrictions Weight Bearing Restrictions: No      Mobility Bed Mobility Overal bed mobility: Needs  Assistance Bed Mobility: Sit to Supine       Sit to supine: Supervision;HOB elevated   General bed mobility comments: Increased time    Transfers Overall transfer level: Needs assistance Equipment used: 1 person hand held assist Transfers: Sit to/from Stand;Stand Pivot Transfers Sit to Stand: Min assist Stand pivot transfers: Min assist       Balance Overall balance assessment: Needs assistance Sitting-balance support: No upper extremity supported;Feet supported Sitting balance-Leahy Scale: Good     Standing balance support: No upper extremity supported;During functional activity Standing balance-Leahy Scale: Poor Standing balance comment: CGA intermittent MIN A for BLE weakness          ADL either performed or assessed with clinical judgement   ADL Overall ADL's : Needs assistance/impaired         General ADL Comments: MIN A for BSC t/f including perihygiene in standing. CGA + single UE support tooth brushing standing grooming sink side.                   Pertinent Vitals/Pain Pain Assessment: No/denies pain     Hand Dominance Right   Extremity/Trunk Assessment Upper Extremity Assessment Upper Extremity Assessment: Generalized weakness   Lower Extremity Assessment Lower Extremity Assessment: Generalized weakness       Communication Communication Communication: No difficulties   Cognition Arousal/Alertness: Awake/alert Behavior During Therapy: WFL for tasks assessed/performed Overall Cognitive Status: Within Functional Limits for tasks assessed          General Comments  BP 100/69, MAP 80, HR 98    Exercises Exercises: Other exercises Other Exercises Other Exercises: Pt educated re: OT rolem DME recs, d/c recs, falls prevention, ECS, importance of mobility for functional strengthening Other Exercises: Toileting,  bathing, tooth brushing, SPT, sit>sup, sitting/standing balance/tolerance   Shoulder Instructions      Home Living  Family/patient expects to be discharged to:: Private residence Living Arrangements: Spouse/significant other Available Help at Discharge: Family;Available PRN/intermittently ( husband works during the day) Type of Home: House Home Access: Stairs to enter Technical brewer of Steps: 4 Entrance Stairs-Rails: Left Home Layout: One level        Home Equipment: [place] - single point          Prior Functioning/Environment Level of Independence: Independent        Comments: intermittent use of SPC        OT Problem List: Decreased strength;Decreased activity tolerance;Impaired balance (sitting and/or standing);Decreased safety awareness;Decreased knowledge of precautions      OT Treatment/Interventions: Self-care/ADL training;Therapeutic exercise;Energy conservation;DME and/or AE instruction;Therapeutic activities;Patient/family education;Balance training    OT Goals(Current goals can be found in the care plan section) Acute Rehab OT Goals Patient Stated Goal: feel better OT Goal Formulation: With patient Time For Goal Achievement: 08/17/20 Potential to Achieve Goals: Good ADL Goals Pt Will Perform Grooming: Independently;standing Pt Will Transfer to Toilet: Independently;ambulating;bedside commode Additional ADL Goal #1: Pt will Independently verbalize plan to implement x3 falls prevention strategies  OT Frequency: Min 1X/week   Barriers to D/C: Decreased caregiver support             AM-PAC OT "6 Clicks" Daily Activity     Outcome Measure Help from another person eating meals?: None Help from another person taking care of personal grooming?: A Little Help from another person toileting, which includes using toliet, bedpan, or urinal?: A Little Help from another person bathing (including washing, rinsing, drying)?: A Little Help from another person to put on and taking off regular upper body clothing?: None Help from another person to put on and taking off regular  lower body clothing?: A Little 6 Click Score: 20   End of Session    Activity Tolerance: Patient tolerated treatment well Patient left: in bed;with call bell/phone within reach;with bed alarm set  OT Visit Diagnosis: Other abnormalities of gait and mobility (R26.89)                Time: 1000-1019 OT Time Calculation (min): 19 min Charges:  OT General Charges $OT Visit: 1 Visit OT Evaluation $OT Eval Low Complexity: 1 Low OT Treatments $Self Care/Home Management : 8-22 mins  Dessie Coma, M.S. OTR/L  08/03/20, 12:40 PM  ascom [phone]

## 2020-08-04 MED ORDER — OXYCODONE-ACETAMINOPHEN 5-325 MG PO TABS
1.0000 | ORAL_TABLET | Freq: Four times a day (QID) | ORAL | Status: DC | PRN
  Administered 2020-08-04 – 2020-08-06 (×8): 1 via ORAL
  Filled 2020-08-04 (×8): qty 1

## 2020-08-04 NOTE — Progress Notes (Signed)
Physical Therapy Treatment Patient Details Name: Anita Reed MRN: [id number] DOB: [birth year] Today's Date: 08/04/2020    History of Present Illness Pt is a 59 y/o F admitted on 07/29/20 with c/c of confusion. Pt has also had a couple of falls. Pt currently being treated for acute hepatic encephalopathy, alcoholic cirrhosis of liver with ascites. Pt also found to have a subaute L1 compression fx. PMH: alcoholic cirrhosis, paroxysmal a-fib not on anticoagulation, chronic hypotension on midodrine, GI bleed, depression, chronic pain, lymphoma, a-flutter, hepatitis, hiatal hernia    PT Comments    Patient agreeable to PT. Patient reports she has been getting up to bed side commode without assistance. Patient with increased activity tolerance this session and progressed gait distance to 49ft using rolling walker. Cues for safety and technique with mobility to facilitate independence. Patient is currently refusing SNF placement and wants to be discharged home. Recommend HHPT and supervision with mobility for safety. PT will continue to follow to maximize independence and address functional limitations listed below.    Follow Up Recommendations  Home health PT;Supervision for mobility/OOB     Equipment Recommendations  Rolling walker with 5" wheels    Recommendations for Other Services       Precautions / Restrictions Precautions Precautions: Fall Restrictions Weight Bearing Restrictions: No    Mobility  Bed Mobility   Bed Mobility: Supine to Sit;Sit to Supine       Sit to supine: Modified independent (Device/Increase time)   General bed mobility comments: increased time required to complete tasks   Transfers Overall transfer level: Needs assistance Equipment used: Rolling walker (2 wheeled)   Sit to Stand: Min assist;Min guard         General transfer comment: Min guard for standing from bed for safety. Min A required for standing from toilet. verbal cues for hand  placement   Ambulation/Gait Ambulation/Gait assistance: Min guard Gait Distance (Feet): 38 Feet Assistive device: Rolling walker (2 wheeled) Gait Pattern/deviations: Step-through pattern;Narrow base of support Gait velocity: decreased   General Gait Details: cues for safety and technique using rolling walker. no gross loss of balance. Min guard for safety. no dizziness reported with activity    Stairs             Wheelchair Mobility    Modified Rankin (Stroke Patients Only)       Balance Overall balance assessment: Needs assistance Sitting-balance support: No upper extremity supported;Feet supported Sitting balance-Leahy Scale: Good     Standing balance support: No upper extremity supported;During functional activity Standing balance-Leahy Scale: Fair Standing balance comment: patient relying on rolling walker for support in standing position                             Cognition Arousal/Alertness: Awake/alert Behavior During Therapy: WFL for tasks assessed/performed Overall Cognitive Status: Within Functional Limits for tasks assessed                                        Exercises      General Comments        Pertinent Vitals/Pain Pain Assessment: No/denies pain    Home Living                      Prior Function            PT Goals (current  goals can now be found in the care plan section) Acute Rehab PT Goals Patient Stated Goal: to go home  PT Goal Formulation: With patient Time For Goal Achievement: 08/16/20 Potential to Achieve Goals: Good Progress towards PT goals: Progressing toward goals    Frequency    Min 2X/week      PT Plan Discharge plan needs to be updated    Co-evaluation              AM-PAC PT "6 Clicks" Mobility   Outcome Measure  Help needed turning from your back to your side while in a flat bed without using bedrails?: A Little Help needed moving from lying on your back  to sitting on the side of a flat bed without using bedrails?: A Little Help needed moving to and from a bed to a chair (including a wheelchair)?: A Little Help needed standing up from a chair using your arms (e.g., wheelchair or bedside chair)?: A Little Help needed to walk in hospital room?: A Little Help needed climbing 3-5 steps with a railing? : A Little 6 Click Score: 18    End of Session Equipment Utilized During Treatment: Gait belt Activity Tolerance: Patient tolerated treatment well     PT Visit Diagnosis: Unsteadiness on feet (R26.81);Muscle weakness (generalized) (M62.81);Difficulty in walking, not elsewhere classified (R26.2);History of falling (Z91.81)     Time: 1553-1610 PT Time Calculation (min) (ACUTE ONLY): 17 min  Charges:  $Therapeutic Activity: 8-22 mins                     Minna Merritts, PT, MPT    Percell Locus 08/04/2020, 4:20 PM

## 2020-08-04 NOTE — Care Management Important Message (Signed)
Important Message  Patient Details  Name: Anita Reed MRN: [id number] Date of Birth: 12-04-1960   Medicare Important Message Given:  Yes     Dannette Barbara 08/04/2020, 1:24 PM

## 2020-08-04 NOTE — Progress Notes (Signed)
Progress Note    [name]  QQI:[id number] DOB: Jun 24, 1961  DOA: 07/29/2020 PCP: Towanda Malkin, MD      Brief Narrative:    Medical records reviewed and are as summarized below:  Anita Reed is a 59 y.o. female with medical history significant for alcoholic cirrhosis, with esophageal varices, paroxysmal A. fib not on anticoagulation, chronic hypotension on midodrine, history of GI bleed, depression and chronic pain, who presented to the ED on 11/30 with acute confusion found to have hepatic encephalopathy with Ammonia level 46 and mild hypernatremia with sodium level 145 mmol/L.   She also has chronic intermittent dizziness and has been falling at home. She was found to have traumatic compression fracture of L1 vertebra. She was treated with analgesics. PT was consulted and discharge to SNF was recommended.   Assessment/Plan:   Active Problems:   Alcoholic cirrhosis of liver with ascites (HCC)    Alcoholic Cirrhosis of liver without ascites (HCC)   History of GI bleed   Underweight   Falls, initial encounter   Acute hepatic encephalopathy   Traumatic compression fracture of L1 lumbar vertebra (HCC)   Elevated troponin   Leucocytosis   AF (paroxysmal atrial fibrillation) (HCC)   Acute hepatic encephalopathy   Alcoholic cirrhosis of liver with ascites ([place]) Mental status is back to baseline. Continue lactulose. Lasix is still on hold because of low blood pressure. No evidence of peripheral edema at this time.  Hypernatremia, resolved  Hypotension, dizziness Intermittent low blood pressure. No evidence of orthostatic hypotension.  - MRI brain ordered on 08/02/2020 was not completed because patient could not tolerate it. She said she has been dealing with dizziness for about 4 months now.  Outpatient follow-up with neurologist as recommended.   - Continue midodrine and meclizine (started on 08/02/2020)  - Given ongoing dizziness and elevated troponins,  check Echo.  Dysphagia  Speech therapist recommended dysphagia 1 diet. -EGD in October 2020 showed mild portal hypertensive gastropathy and grade 2 varices in the middlethird and lower third of the esophagus    Falls at home, subsequent encounter   Traumatic compression fracture of L1 lumbar vertebra, subacute ([place]) - Patient would be poor candidate for a vertebral augmentation procedure and limited evidence of benefit.  Will do our best to manage conservatively. - If pain remains severe and ongoing despite opiate therapy - will consult Neurosurgery for their opinion.  Physical Deconditioning - PT/OT recommends home health with PT and rolling walker.  Abdominal pain and bilateral leg pain (peripheral neuropathy) She said her abdominal pain is usually from abdominal cramps from diarrhea.  She attributes leg pain to neuropathy.   - Analgesics as needed for pain.  Hypokalemia Improved. Continue potassium repletion -  Chronic Leukocytosis -No indication for antibiotics.  Elevated troponin -Troponin 46 >38. Probably demand ischemia -No chest pain and EKG is nonacute -Check Echo as above.  Paroxysmal atrial fibrillation (HCC) -Patient currently in sinus rhythm -Continue digoxin.     History of GI bleed H&H is stable.   Body mass index is 19.89 kg/m.  (Underweight)  Diet Order            DIET - DYS 1 Room service appropriate? Yes; Fluid consistency: Nectar Thick  Diet effective now                Consultants:  None  Procedures:  None    Medications:   . digoxin  125 mcg Oral Daily  . enoxaparin (LOVENOX) injection  40 mg Subcutaneous Q24H  . fludrocortisone  0.05 mg Oral Daily  . FLUoxetine  20 mg Oral Daily  . gabapentin  400 mg Oral TID  . lactulose  30 g Oral Once  . lactulose  30 g Oral TID  . meclizine  12.5 mg Oral TID  . midodrine  10 mg Oral TID WC  . pantoprazole  40 mg Oral Daily  . potassium chloride  10 mEq Oral Daily   Continuous  Infusions:    Family Communication/Anticipated D/C date and plan/Code Status   DVT prophylaxis: enoxaparin (LOVENOX) injection 40 mg Start: 07/29/20 2100     Code Status: Full Code  Family Communication: None Disposition Plan:    Status is: Inpatient  Remains inpatient appropriate because:Inpatient level of care appropriate due to severity of illness   Dispo: The patient is from: Home              Anticipated d/c is to: Home Health with PT and rolling walker.              Anticipated d/c date is: 2 days              Patient currently is not medically stable to d/c.   Subjective:   Patient continues to have lower back pain. Denies chest pain and shortness of breath.  Objective:    Vitals:   08/04/20 0404 08/04/20 0800 08/04/20 1156 08/04/20 1627  BP: 112/64 102/68 120/74 (!) 107/53  Pulse: 97 99 92 91  Resp: 20  18 16   Temp: 98.2 F (36.8 C) 98.2 F (36.8 C) 98 F (36.7 C) 98.1 F (36.7 C)  TempSrc: Oral Oral Oral   SpO2: 95% 92% 99% 91%  Weight:      Height:       No data found.   Intake/Output Summary (Last 24 hours) at 08/04/2020 1831 Last data filed at 08/04/2020 1356 Gross per 24 hour  Intake 0 ml  Output --  Net 0 ml   Filed Weights   07/29/20 1538 07/30/20 0007  Weight: 56.7 kg 61.1 kg    Exam:  GEN: chronically ill appearing, NAD SKIN: No rash EYES: EOMI ENT: MMM CV: RRR, did not appreciate a murmur. PULM: CTA B ABD: soft, ND, NT, +BS CNS: AAO x 3, no focal deficits EXT: No edema or tenderness MSK: Tenderness along the lumbar spine    Data Reviewed:   I have personally reviewed following labs and imaging studies:  Labs: Labs show the following:   Basic Metabolic Panel: Recent Labs  Lab 07/29/20 1541 07/29/20 1541 07/30/20 0543 07/30/20 0543 07/31/20 0614 07/31/20 0614 08/01/20 0459 08/02/20 1035  NA 145  --  146*  --  148*  --  145 144  K 3.4*   < > 3.3*   < > 3.1*   < > 3.6 4.1  CL 106  --  108  --  109  --  107  111  CO2 26  --  26  --  27  --  26 25  GLUCOSE 109*  --  77  --  82  --  107* 97  BUN 14  --  12  --  9  --  7 7  CREATININE 0.54  --  0.43*  --  0.55  --  0.44 0.36*  CALCIUM 9.0  --  8.9  --  8.9  --  8.4* 8.7*  MG  --   --  1.8  --   --   --   --   --  PHOS  --   --  3.4  --   --   --   --   --    < > = values in this interval not displayed.   GFR Estimated Creatinine Clearance: 73 mL/min (A) (by C-G formula based on SCr of 0.36 mg/dL (L)). Liver Function Tests: Recent Labs  Lab 07/29/20 1800 07/30/20 0543  AST 68* 62*  ALT 37 31  ALKPHOS 221* 209*  BILITOT 5.7* 6.2*  PROT 6.0* 5.7*  ALBUMIN 3.2* 3.1*    Recent Labs  Lab 07/29/20 1542  AMMONIA 46*    CBC: Recent Labs  Lab 07/29/20 1541 07/30/20 0543 07/31/20 0614 08/01/20 0459 08/02/20 1035  WBC 14.2* 14.6* 12.2* 14.7* 14.8*  NEUTROABS  --   --  7.5 8.8* 8.6*  HGB 14.4 13.9 15.5* 14.4 14.1  HCT 41.7 41.7 46.0 43.5 43.9  MCV 95.6 96.3 97.3 98.2 100.0  PLT 178 170 155 161 140*   Sepsis Labs: Recent Labs  Lab 07/29/20 1541 07/29/20 1541 07/30/20 0543 07/31/20 0614 08/01/20 0459 08/02/20 1035  PROCALCITON <0.10  --   --   --   --   --   WBC 14.2*   < > 14.6* 12.2* 14.7* 14.8*   < > = values in this interval not displayed.    Microbiology Recent Results (from the past 240 hour(s))  Resp Panel by RT-PCR (Flu A&B, Covid) Nasopharyngeal Swab     Status: None   Collection Time: 07/29/20  6:48 PM   Specimen: Nasopharyngeal Swab; Nasopharyngeal(NP) swabs in vial transport medium  Result Value Ref Range Status   SARS Coronavirus 2 by RT PCR NEGATIVE NEGATIVE Final    Comment: (NOTE) SARS-CoV-2 target nucleic acids are NOT DETECTED.  The SARS-CoV-2 RNA is generally detectable in upper respiratory specimens during the acute phase of infection. The lowest concentration of SARS-CoV-2 viral copies this assay can detect is 138 copies/mL. A negative result does not preclude SARS-Cov-2 infection and should  not be used as the sole basis for treatment or other patient management decisions. A negative result may occur with  improper specimen collection/handling, submission of specimen other than nasopharyngeal swab, presence of viral mutation(s) within the areas targeted by this assay, and inadequate number of viral copies(<138 copies/mL). A negative result must be combined with clinical observations, patient history, and epidemiological information. The expected result is Negative.  Fact Sheet for Patients:  EntrepreneurPulse.com.au  Fact Sheet for Healthcare Providers:  IncredibleEmployment.be  This test is no t yet approved or cleared by the [place] FDA and  has been authorized for detection and/or diagnosis of SARS-CoV-2 by FDA under an Emergency Use Authorization (EUA). This EUA will remain  in effect (meaning this test can be used) for the duration of the COVID-19 declaration under Section 564(b)(1) of the Act, 21 U.S.C.section 360bbb-3(b)(1), unless the authorization is terminated  or revoked sooner.       Influenza A by PCR NEGATIVE NEGATIVE Final   Influenza B by PCR NEGATIVE NEGATIVE Final    Comment: (NOTE) The Xpert Xpress SARS-CoV-2/FLU/RSV plus assay is intended as an aid in the diagnosis of influenza from Nasopharyngeal swab specimens and should not be used as a sole basis for treatment. Nasal washings and aspirates are unacceptable for Xpert Xpress SARS-CoV-2/FLU/RSV testing.  Fact Sheet for Patients: EntrepreneurPulse.com.au  Fact Sheet for Healthcare Providers: IncredibleEmployment.be  This test is not yet approved or cleared by the [place] FDA and has been authorized for detection and/or diagnosis  of SARS-CoV-2 by FDA under an Emergency Use Authorization (EUA). This EUA will remain in effect (meaning this test can be used) for the duration of the COVID-19 declaration under  Section 564(b)(1) of the Act, 21 U.S.C. section 360bbb-3(b)(1), unless the authorization is terminated or revoked.  Performed at [facility], [address] [place], [place] [address]   Urine Culture     Status: Abnormal   Collection Time: 07/30/20  5:27 PM   Specimen: Urine, Random  Result Value Ref Range Status   Specimen Description   Final    URINE, RANDOM Performed at [facility], [address], [place], [place] [address]    Special Requests   Final    NONE Performed at [facility], [address] [place], [place] [address]    Culture (A)  Final    <10,000 COLONIES/mL INSIGNIFICANT GROWTH Performed at [facility], [place] [address], [place], [place] [address]    Report Status 08/01/2020 FINAL  Final    Procedures and diagnostic studies:  No results found.    LOS: 6 days   George Hugh  [facility]   Pager on [email or web address]. If 7PM-7AM, please contact night-coverage at [email or web address]   08/04/2020, 6:31 PM

## 2020-08-05 ENCOUNTER — Inpatient Hospital Stay: Payer: Medicare Other

## 2020-08-05 LAB — CBC
HCT: 40.1 % (ref 36.0–46.0)
Hemoglobin: 13.3 g/dL (ref 12.0–15.0)
MCH: 32.5 pg (ref 26.0–34.0)
MCHC: 33.2 g/dL (ref 30.0–36.0)
MCV: 98 fL (ref 80.0–100.0)
Platelets: 116 10*3/uL — ABNORMAL LOW (ref 150–400)
RBC: 4.09 MIL/uL (ref 3.87–5.11)
RDW: 17.3 % — ABNORMAL HIGH (ref 11.5–15.5)
WBC: 11.6 10*3/uL — ABNORMAL HIGH (ref 4.0–10.5)
nRBC: 0 % (ref 0.0–0.2)

## 2020-08-05 LAB — BASIC METABOLIC PANEL
Anion gap: 8 (ref 5–15)
BUN: 9 mg/dL (ref 6–20)
CO2: 26 mmol/L (ref 22–32)
Calcium: 8.4 mg/dL — ABNORMAL LOW (ref 8.9–10.3)
Chloride: 109 mmol/L (ref 98–111)
Creatinine, Ser: <0.3 mg/dL — ABNORMAL LOW (ref 0.44–1.00)
Glucose, Bld: 119 mg/dL — ABNORMAL HIGH (ref 70–99)
Potassium: 3.6 mmol/L (ref 3.5–5.1)
Sodium: 143 mmol/L (ref 135–145)

## 2020-08-05 IMAGING — MR Imaging study
1 series · 48 of 48 positions shown · IV contrast (gadavist)
Comparison: Head CT [DATE].

CLINICAL DATA: 59-year-old female with dizziness. Paroxysmal atrial
fibrillation. Cirrhosis.

EXAM:
MRI HEAD WITHOUT AND WITH CONTRAST
TECHNIQUE: Multiplanar, multiecho pulse sequences of the brain and surrounding
structures were obtained without and with intravenous contrast.
CONTRAST:  A total of 12 mL GADAVIST GADOBUTROL 1 MMOL/ML IV SOLN,
after the initial dose of 6 mL failed to demonstrate any soft tissue
enhancement (presumed small volume contrast extravasation) at that
IV site.

[Series 1: aahead_scout · sagittal · 1.6mm · 1.62mm/px · 48 of 128 slices shown]
[im 1/128]
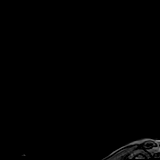
[im 3/128]
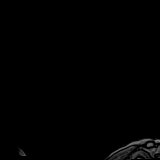
[im 6/128]
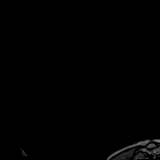
[im 9/128]
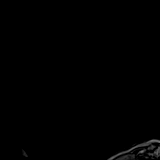
[im 11/128]
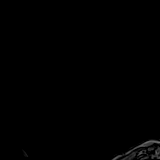
[im 14/128]
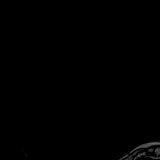
[im 17/128]
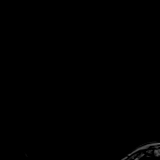
[im 19/128]
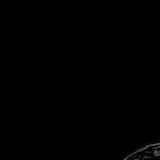
[im 22/128]
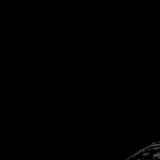
[im 25/128]
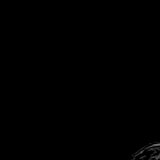
[im 28/128]
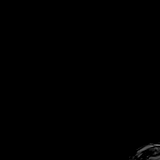
[im 30/128]
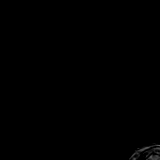
[im 33/128]
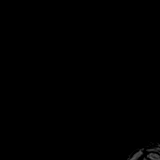
[im 36/128]
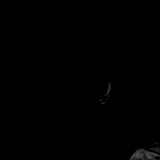
[im 38/128]
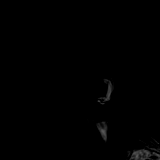
[im 41/128]
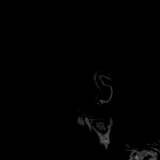
[im 44/128]
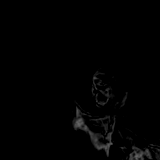
[im 46/128]
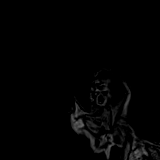
[im 49/128]
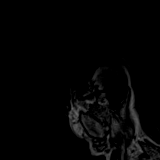
[im 52/128]
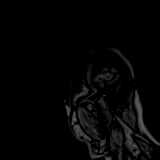
[im 55/128]
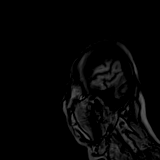
[im 57/128]
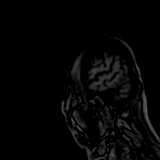
[im 60/128]
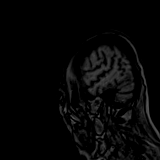
[im 63/128]
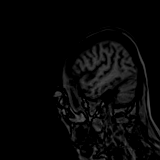
[im 65/128]
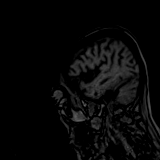
[im 68/128]
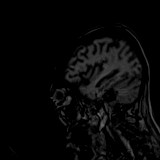
[im 71/128]
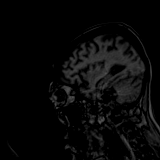
[im 73/128]
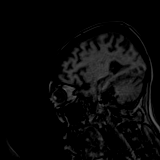
[im 76/128]
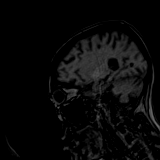
[im 79/128]
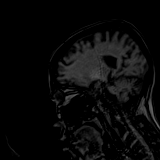
[im 82/128]
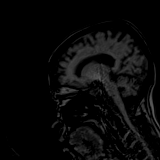
[im 84/128]
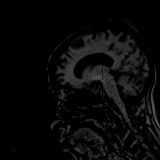
[im 87/128]
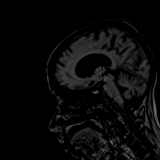
[im 90/128]
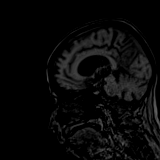
[im 92/128]
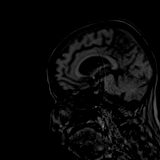
[im 95/128]
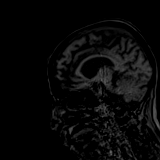
[im 98/128]
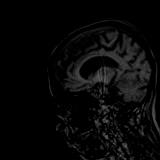
[im 100/128]
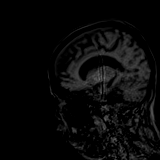
[im 103/128]
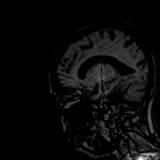
[im 106/128]
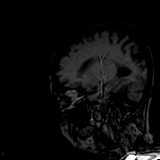
[im 109/128]
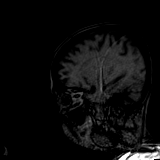
[im 111/128]
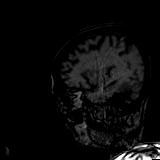
[im 114/128]
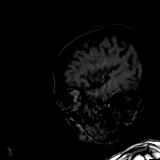
[im 117/128]
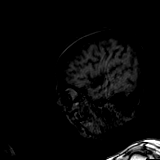
[im 119/128]
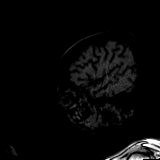
[im 122/128]
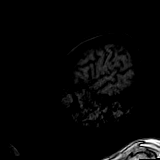
[im 125/128]
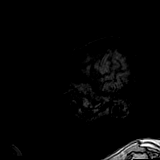
[im 128/128]
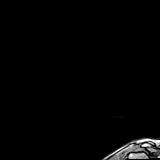

[48 of 48 positions shown; findings below may reference images not displayed]

FINDINGS: Study is intermittently degraded by motion artifact despite repeated
imaging attempts.

Brain: No restricted diffusion to suggest acute infarction. No
midline shift, mass effect, evidence of mass lesion,
ventriculomegaly, extra-axial collection or acute intracranial
hemorrhage. Cervicomedullary junction and pituitary are within
normal limits.

Cerebral volume loss appears somewhat advanced for age. There is
abnormal intrinsic increased T1 signal in the globus pallidus
(series 17, image 12). Superimposed moderate patchy, scattered
bilateral nonspecific white matter T2 and FLAIR hyperintensity. No
cortical encephalomalacia or chronic cerebral blood products
identified.

No abnormal enhancement identified.  No dural thickening.

Vascular: Major intracranial vascular flow voids are grossly
preserved.

Skull and upper cervical spine: Limited detail of the cervical spine
due to motion on sagittal images. Visualized bone marrow signal is
within normal limits.

Sinuses/Orbits: Negative orbits. Trace paranasal sinus mucosal
thickening.

Other: Visible internal auditory structures appear grossly normal,
well aerated mastoid air cells. Normal stylomastoid foramina.
Negative visible scalp and face.
IMPRESSION: 1. Intermittently motion degraded.  Negative for acute infarct.

2. Abnormal intrinsic T1 signal in the globus pallidus, compatible
with cirrhosis. Correlation with serum ammonia levels may be
valuable.

3. Superimposed generalized cerebral volume loss. Moderate
nonspecific cerebral white matter signal changes, most commonly due
to chronic small vessel disease.

## 2020-08-05 IMAGING — MR MR HEAD WO/W CM
19 series · 48 of 48 positions shown · IV contrast (gadavist)
Comparison: Head CT [DATE].

CLINICAL DATA: 59-year-old female with dizziness. Paroxysmal atrial
fibrillation. Cirrhosis.

EXAM:
MRI HEAD WITHOUT AND WITH CONTRAST
TECHNIQUE: Multiplanar, multiecho pulse sequences of the brain and surrounding
structures were obtained without and with intravenous contrast.
CONTRAST:  A total of 12 mL GADAVIST GADOBUTROL 1 MMOL/ML IV SOLN,
after the initial dose of 6 mL failed to demonstrate any soft tissue
enhancement (presumed small volume contrast extravasation) at that
IV site.

[Series 3: ax dwi_tracew · axial · 3.0mm · 1.31mm/px · z∈[-49,+101]mm · 4 of 48 slices shown]
[im 1/48]
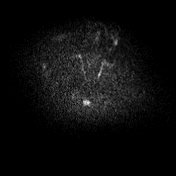
[im 16/48]
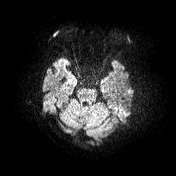
[im 32/48]
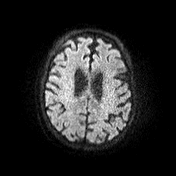
[im 48/48]
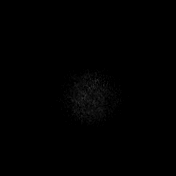

[Series 4: ax dwi_adc · axial · 3.0mm · 1.31mm/px · z∈[-49,+101]mm · 4 of 48 slices shown]
[im 1/48]
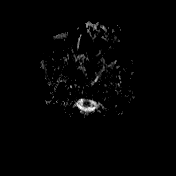
[im 16/48]
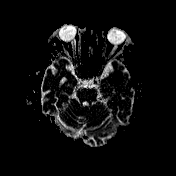
[im 32/48]
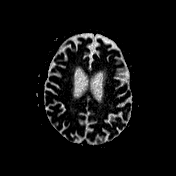
[im 48/48]
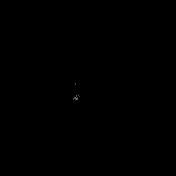

[Series 5: cor dwi_tracew · coronal · 5.0mm · 1.31mm/px · 3 of 36 slices shown]
[im 1/36]
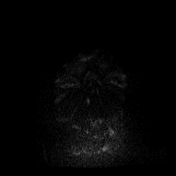
[im 18/36]
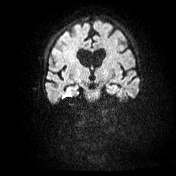
[im 36/36]
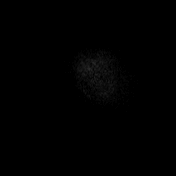

[Series 6: cor dwi_adc · coronal · 5.0mm · 1.31mm/px · 3 of 36 slices shown]
[im 1/36]
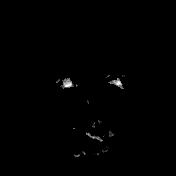
[im 18/36]
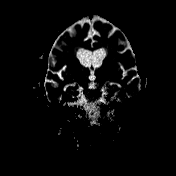
[im 36/36]
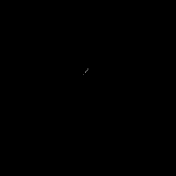

[Series 7: T1 · sagittal · 5.0mm · 0.94mm/px · 2 of 23 slices shown (1 of 5)]
[im 1/23]
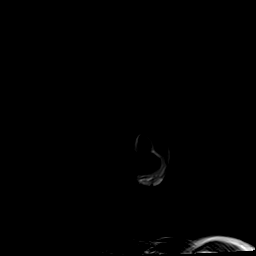
[im 23/23]
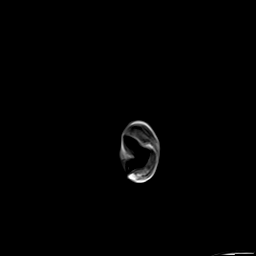

[Series 8: T2 · axial · 5.0mm · 0.45mm/px · z∈[-52,+99]mm · 2 of 27 slices shown (1 of 2)]
[im 1/27]
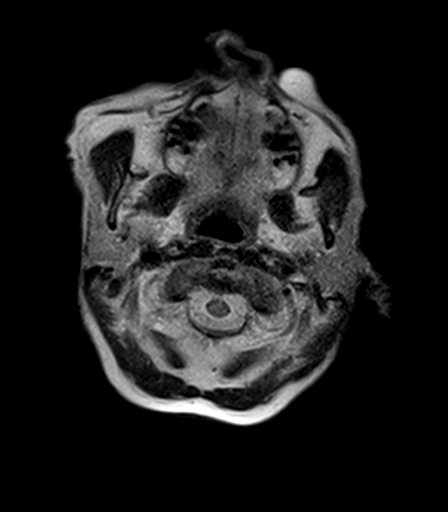
[im 27/27]
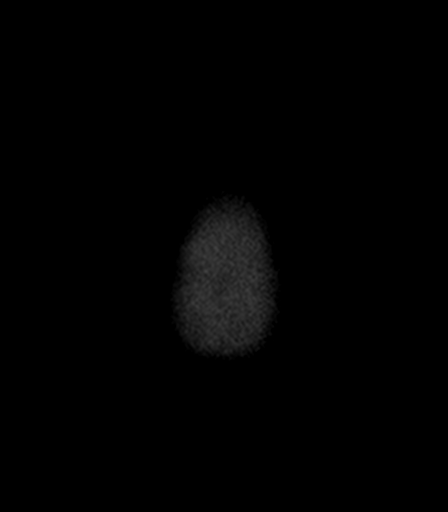

[Series 9: T2-star · axial · 5.0mm · 0.45mm/px · z∈[-52,+99]mm · 2 of 27 slices shown]
[im 1/27]
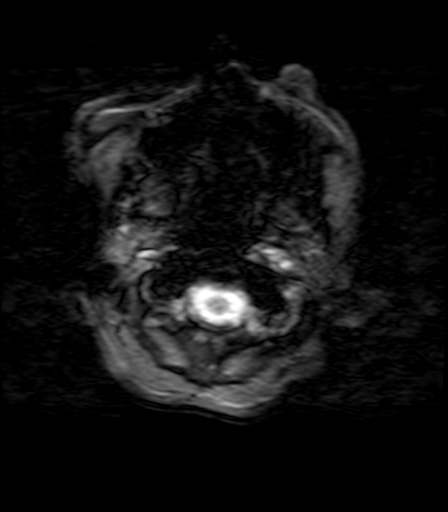
[im 27/27]
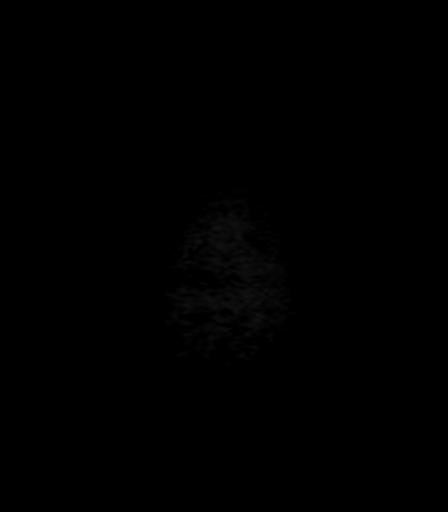

[Series 10: T1 · sagittal · 5.0mm · 0.94mm/px · 2 of 23 slices shown (2 of 5)]
[im 1/23]
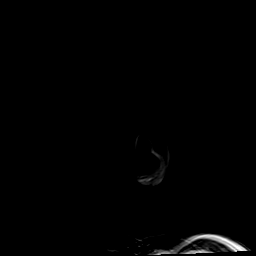
[im 23/23]
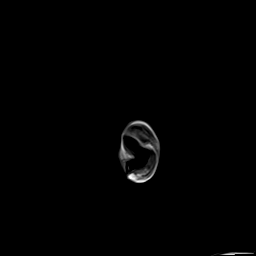

[Series 11: FLAIR · axial · 5.0mm · 1.20mm/px · z∈[-49,+102]mm · 2 of 27 slices shown]
[im 1/27]
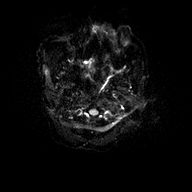
[im 27/27]
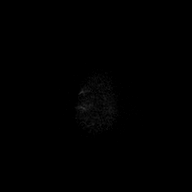

[Series 12: T1 · axial · 5.0mm · 0.90mm/px · z∈[-52,+99]mm · 2 of 27 slices shown (3 of 5)]
[im 1/27]
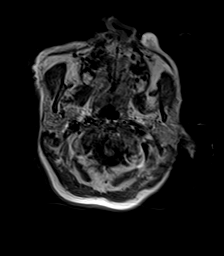
[im 27/27]
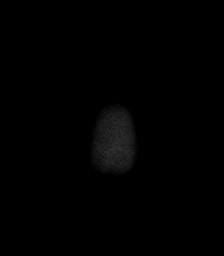

[Series 13: T2 · coronal · 5.0mm · 0.45mm/px · 3 of 31 slices shown (2 of 2)]
[im 1/31]
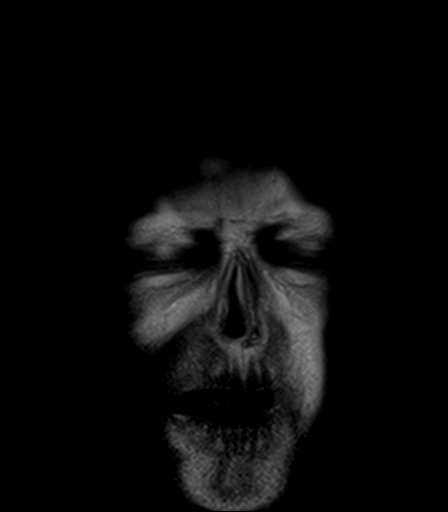
[im 16/31]
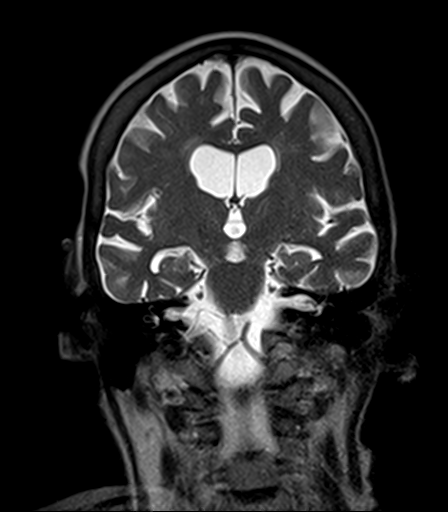
[im 31/31]
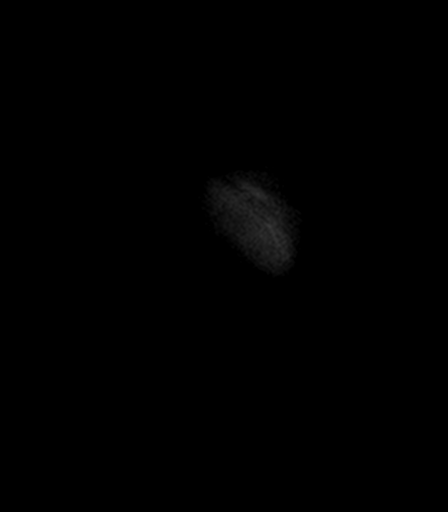

[Series 14: T1 post-contrast · axial · 5.0mm · 0.90mm/px · z∈[-52,+99]mm · 2 of 27 slices shown (1 of 6)]
[im 1/27]
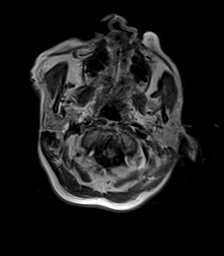
[im 27/27]
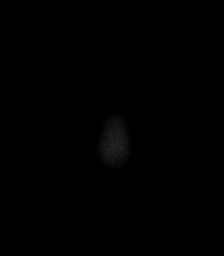

[Series 15: T1 post-contrast · coronal · 5.0mm · 0.90mm/px · 3 of 31 slices shown (2 of 6)]
[im 1/31]
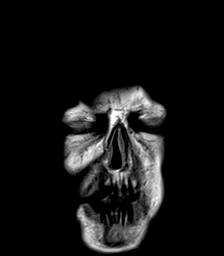
[im 16/31]
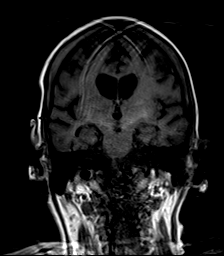
[im 31/31]
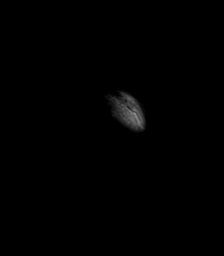

[Series 16: T1 · sagittal · 5.0mm · 0.94mm/px · 2 of 23 slices shown (4 of 5)]
[im 1/23]
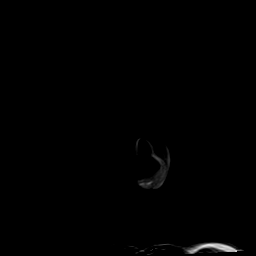
[im 23/23]
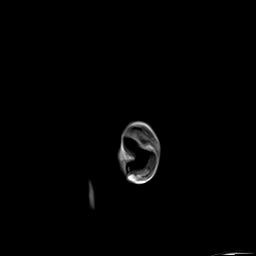

[Series 17: T1 post-contrast · axial · 5.0mm · 0.90mm/px · z∈[-52,+93]mm · 2 of 26 slices shown (3 of 6)]
[im 1/26]
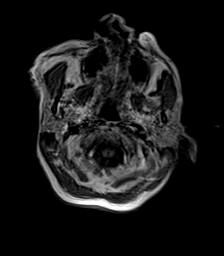
[im 26/26]
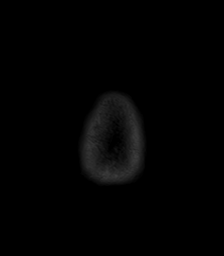

[Series 18: T1 post-contrast · coronal · 5.0mm · 0.90mm/px · 3 of 31 slices shown (4 of 6)]
[im 1/31]
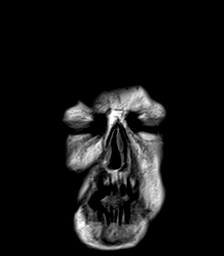
[im 16/31]
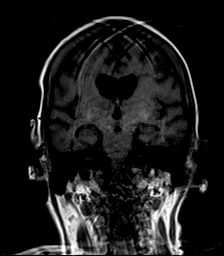
[im 31/31]
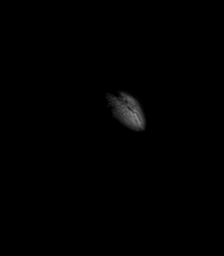

[Series 21: T1 · oblique · 5.0mm · 0.94mm/px · 2 of 23 slices shown (5 of 5)]
[im 1/23]
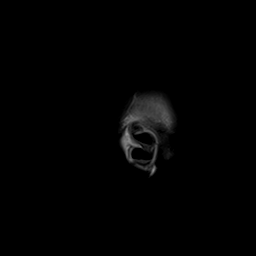
[im 23/23]
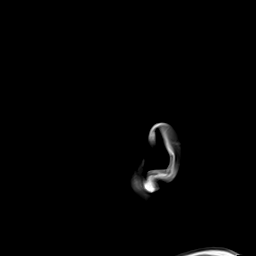

[Series 22: T1 post-contrast · axial · 5.0mm · 0.90mm/px · z∈[-69,+82]mm · 2 of 27 slices shown (5 of 6)]
[im 1/27]
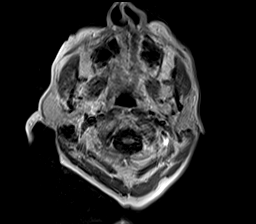
[im 27/27]
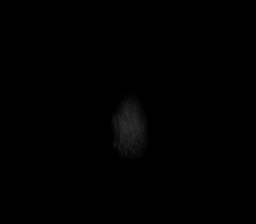

[Series 23: T1 post-contrast · oblique · 5.0mm · 0.90mm/px · 3 of 31 slices shown (6 of 6)]
[im 1/31]
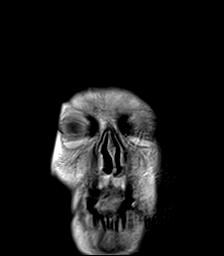
[im 16/31]
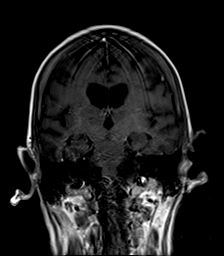
[im 31/31]
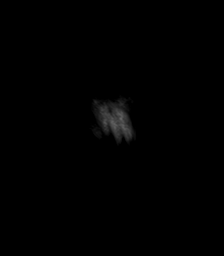

[48 of 48 positions shown; findings below may reference images not displayed]

FINDINGS: Study is intermittently degraded by motion artifact despite repeated
imaging attempts.

Brain: No restricted diffusion to suggest acute infarction. No
midline shift, mass effect, evidence of mass lesion,
ventriculomegaly, extra-axial collection or acute intracranial
hemorrhage. Cervicomedullary junction and pituitary are within
normal limits.

Cerebral volume loss appears somewhat advanced for age. There is
abnormal intrinsic increased T1 signal in the globus pallidus
(series 17, image 12). Superimposed moderate patchy, scattered
bilateral nonspecific white matter T2 and FLAIR hyperintensity. No
cortical encephalomalacia or chronic cerebral blood products
identified.

No abnormal enhancement identified.  No dural thickening.

Vascular: Major intracranial vascular flow voids are grossly
preserved.

Skull and upper cervical spine: Limited detail of the cervical spine
due to motion on sagittal images. Visualized bone marrow signal is
within normal limits.

Sinuses/Orbits: Negative orbits. Trace paranasal sinus mucosal
thickening.

Other: Visible internal auditory structures appear grossly normal,
well aerated mastoid air cells. Normal stylomastoid foramina.
Negative visible scalp and face.
IMPRESSION: 1. Intermittently motion degraded.  Negative for acute infarct.

2. Abnormal intrinsic T1 signal in the globus pallidus, compatible
with cirrhosis. Correlation with serum ammonia levels may be
valuable.

3. Superimposed generalized cerebral volume loss. Moderate
nonspecific cerebral white matter signal changes, most commonly due
to chronic small vessel disease.

## 2020-08-05 MED ORDER — CLONAZEPAM 0.25 MG PO TBDP
0.2500 mg | ORAL_TABLET | Freq: Once | ORAL | Status: AC | PRN
  Administered 2020-08-05: 0.25 mg via ORAL
  Filled 2020-08-05: qty 1

## 2020-08-05 MED ORDER — TRAMADOL HCL 50 MG PO TABS
50.0000 mg | ORAL_TABLET | Freq: Once | ORAL | Status: AC
  Administered 2020-08-05: 50 mg via ORAL
  Filled 2020-08-05: qty 1

## 2020-08-05 MED ORDER — GADOBUTROL 1 MMOL/ML IV SOLN
6.0000 mL | Freq: Once | INTRAVENOUS | Status: AC | PRN
  Administered 2020-08-05: 6 mL via INTRAVENOUS

## 2020-08-05 MED ORDER — POTASSIUM CHLORIDE CRYS ER 20 MEQ PO TBCR
20.0000 meq | EXTENDED_RELEASE_TABLET | Freq: Once | ORAL | Status: AC
  Administered 2020-08-05: 20 meq via ORAL
  Filled 2020-08-05: qty 1

## 2020-08-05 MED ORDER — TIZANIDINE HCL 4 MG PO TABS
4.0000 mg | ORAL_TABLET | Freq: Four times a day (QID) | ORAL | Status: DC | PRN
  Administered 2020-08-06: 4 mg via ORAL
  Filled 2020-08-05: qty 1

## 2020-08-05 NOTE — Progress Notes (Addendum)
Speech Language Pathology Treatment: Dysphagia  Patient Details Name: Anita Reed MRN: [id number] DOB: 04-28-1961 Today's Date: 08/05/2020 Time: 1350-1430 SLP Time Calculation (min) (ACUTE ONLY): 40 min  Assessment / Plan / Recommendation Clinical Impression  Pt seen for follow up of swallowing; trials to upgrade diet consistency. She appears improved and more alert since the initial evaluation. Pt verbally responsive and able to follow instructions w/ min cues. Noted pt appeared easily distracted often jumping from topic to topic in conversation but was able to describe her eating/drinking difficulties at home; and that she was going to "do what I want to do w/ things". Pt is on RA; wbc mildly elevated. Pt's diet has been upgraded by the MD per NSG request in chart notes.  Pt explained general aspiration precautions and agreed verbally to the need for following them especially sitting upright for all oral intake and using small bites/sips. Pt positioned herself on EOB w/ mild lower back discomfort which she stated was Baseline for her. Pt has been on a Dysphagia level 1 w/ Nectar liquids d/t lethargy and confusion; pt also has Baseline Esophageal dysmotility -- alcoholic cirrhosis with esophageal varices per chart notes. Pt consumed Cup trials of thin liquids, then purees and soft solids. NO overt clinical s/s of aspiration were noted w/ any consistency; respiratory status remained calm and unlabored, vocal quality clear b/t trials. Pt held Cup when drinking following instructions for single, small sips slowly. NO straws were utiilized for better oral control, and swallowing. Oral phase appeared grossly [facility] for bolus management and timely A-P transfer for swallowing w/ trials of semi-solid foods moistened well; oral clearing achieved w/ all consistencies. Discussed and modeled w/ pt the need for Small, single bites of foods for ease of Esophageal motility and encouraged alternating foods/liquids to aid  also.  Discussed and modeled general aspiration precautions to include small, single boluses, full upright sitting, no talking during eating/drinking -- reducing distractions. Discussed Esophageal phase dysmotility and encouraged following REFLUX precautions -- handouts given. Pt agreed verbally and was able to demonstrate the precautions. She continues to be easily Distracted and needed min redirection to tasks.  Recommend upgrade to a more mech soft type consistency diet w/ gravies added to moisten foods; Thin liquids Via Cup -- NO Straws(less air swallowing). Recommend general aspiration precautions; Pills Whole vs Crushed in Puree for ease of swallowing and clearing the Esophagus; tray setup and positioning assistance for meals. ST services can be available for any further new needs/changes in status while admitted. MD/NSG updated -- encouraged NSG to monitor pt's status during/post meals for any discomfort/issues. Precautions posted at bedside.      HPI HPI: Per admitting H&P " Anita Reed is a 59 y.o. female with medical history significant for alcoholic cirrhosis, with esophageal varices, paroxysmal A. fib not on anticoagulation, chronic hypotension on midodrine, history of GI bleed, depression and chronic pain, who was brought to the emergency room due to concern for confusion at home. Most of the history taken from patient's husband and ER report due to confusion. Patient has been using lactulose as prescribed with frequent bowel movements daily. The confusion has led to a couple falls and she complains of back pain and headache. She has had no nausea, vomiting, abdominal pain or dysuria. Denies cough, fever chills or shortness of breath"      SLP Plan  All goals met       Recommendations  Diet recommendations: Regular;Thin liquid (per pt request and MD upgrade on foods)  Liquids provided via: Cup;No straw Medication Administration: Whole meds with puree (vs need to crush in  puree) Supervision: Patient able to self feed Compensations: Minimize environmental distractions;Slow rate;Small sips/bites;Lingual sweep for clearance of pocketing;Multiple dry swallows after each bite/sip;Follow solids with liquid Postural Changes and/or Swallow Maneuvers: Seated upright 90 degrees;Upright 30-60 min after meal (REFLUX precautions)                General recommendations:  (Dietician f/u) Oral Care Recommendations: Oral care BID;Oral care before and after PO;Patient independent with oral care Follow up Recommendations: None SLP Visit Diagnosis: Dysphagia, pharyngoesophageal phase (R13.14) Plan: All goals met       GO                 [name], MS, CCC-SLP Speech Language Pathologist Rehab Services [phone] [facility] 08/05/2020, 2:31 PM

## 2020-08-05 NOTE — Progress Notes (Signed)
Progress Note    Anita Reed  HRC:[id number] DOB: Jul 09, 1961  DOA: 07/29/2020 PCP: Towanda Malkin, MD      Brief Narrative:    Medical records reviewed and are as summarized below:  Anita Reed is a 59 y.o. female with medical history significant for alcoholic cirrhosis, paroxysmal A. fib not on anticoagulation due to history of variceal bleeds in 2020, chronic hypotension on midodrine, chronic lower back pain who presented to the ED on 11/30 with acute confusion found to have hepatic encephalopathy with Ammonia level 46 and mild hypernatremia with sodium level 145 mmol/L.   She also has chronic intermittent dizziness and has been falling at home. She was found to have traumatic compression fracture of L1 vertebra, which is being managed conservatively.  Assessment/Plan:   Active Problems:   Alcoholic cirrhosis of liver with ascites (HCC)    Alcoholic Cirrhosis of liver without ascites (HCC)   History of GI bleed   Underweight   Falls, initial encounter   Acute hepatic encephalopathy   Traumatic compression fracture of L1 lumbar vertebra (HCC)   Elevated troponin   Leucocytosis   AF (paroxysmal atrial fibrillation) (HCC)   Acute Toxic Metabolic Encephalopathy - hepatic encephalopathy vs other - Check MRI of brain. - Continue Lactulose 20 mg TID and titrate to 2-3 bowel movements per day until shows clinical improvement. - Patient can continue prophylactic Lactulose at home. - She should be stable for discharge tomorrow.  Alcoholic cirrhosis of liver with ascites - Abdomen is benign.  Hypernatremia, resolved  Hypotension, dizziness - On midodrine and meclizine (started on 08/02/2020) - Check Echo.  Dysphagia  Speech therapist recommended dysphagia 1 diet.    Falls at home, subsequent encounter  Traumatic compression fracture of L1 lumbar vertebra, subacute ([place]) - Patient would be poor candidate for a vertebral augmentation procedure and  limited evidence of benefit.  Will do our best to manage conservatively. - If pain remains severe and ongoing despite opiate therapy - will consult Neurosurgery for their opinion.  Physical Deconditioning - PT/OT recommends discharge home health with PT and rolling walker.  Abdominal pain and bilateral leg pain (peripheral neuropathy) She said her abdominal pain is usually from abdominal cramps from diarrhea.  She attributes leg pain to neuropathy.   - Analgesics as needed for pain.  Hypokalemia Improved. Continue potassium repletion -  Chronic Leukocytosis -No indication for antibiotics.  Elevated troponin -Troponin 46 >38. Probably demand ischemia -No chest pain and EKG is nonacute -Check Echo as above.  Paroxysmal atrial fibrillation (HCC) -Patient currently in sinus rhythm -Continue digoxin.    Body mass index is 19.89 kg/m.  (Underweight)  Diet Order            DIET SOFT Room service appropriate? Yes; Fluid consistency: Thin  Diet effective now                Consultants:  None  Procedures:  None   Medications:   . digoxin  125 mcg Oral Daily  . enoxaparin (LOVENOX) injection  40 mg Subcutaneous Q24H  . fludrocortisone  0.05 mg Oral Daily  . FLUoxetine  20 mg Oral Daily  . gabapentin  400 mg Oral TID  . lactulose  30 g Oral Once  . lactulose  30 g Oral TID  . meclizine  12.5 mg Oral TID  . midodrine  10 mg Oral TID WC  . pantoprazole  40 mg Oral Daily  . potassium chloride  10 mEq Oral  Daily  . traMADol  50 mg Oral Once   Continuous Infusions:   Family Communication/Anticipated D/C date and plan/Code Status   DVT prophylaxis: enoxaparin (LOVENOX) injection 40 mg Start: 07/29/20 2100     Code Status: Full Code  Family Communication: None Disposition Plan:   Status is: Inpatient  Remains inpatient appropriate because:Inpatient level of care appropriate due to severity of illness   Dispo: The patient is from: Home               Anticipated d/c is to: Home Health with PT and rolling walker.              Anticipated d/c date is: 2 days              Patient currently is not medically stable to d/c.   Subjective:   Patient continues to have lower back pain. Denies chest pain and shortness of breath.  Objective:    Vitals:   08/05/20 0325 08/05/20 0800 08/05/20 1133 08/05/20 1621  BP: (!) 111/57 93/60 112/64 132/75  Pulse: 90 95 88 91  Resp: 17 20 18 16   Temp: 98.2 F (36.8 C) 97.8 F (36.6 C) 97.7 F (36.5 C) 98.3 F (36.8 C)  TempSrc: Oral Oral Oral Oral  SpO2: 98% 98% 97% 97%  Weight:      Height:       No data found.   Intake/Output Summary (Last 24 hours) at 08/05/2020 1745 Last data filed at 08/05/2020 0900 Gross per 24 hour  Intake 120 ml  Output 0 ml  Net 120 ml   Filed Weights   07/29/20 1538 07/30/20 0007  Weight: 56.7 kg 61.1 kg    Exam:  GEN: chronically ill appearing, NAD SKIN: No rash EYES: EOMI ENT: MMM CV: RRR, did not appreciate a murmur. PULM: CTA B ABD: soft, ND, NT, +BS CNS: AAO x 3, no focal deficits EXT: No edema or tenderness MSK: Tenderness along the lumbar spine    Data Reviewed:   I have personally reviewed following labs and imaging studies:  Labs: Labs show the following:   Basic Metabolic Panel: Recent Labs  Lab 07/30/20 0543 07/30/20 0543 07/31/20 0614 07/31/20 0614 08/01/20 0459 08/01/20 0459 08/02/20 1035 08/05/20 0525  NA 146*  --  148*  --  145  --  144 143  K 3.3*   < > 3.1*   < > 3.6   < > 4.1 3.6  CL 108  --  109  --  107  --  111 109  CO2 26  --  27  --  26  --  25 26  GLUCOSE 77  --  82  --  107*  --  97 119*  BUN 12  --  9  --  7  --  7 9  CREATININE 0.43*  --  0.55  --  0.44  --  0.36* <0.30*  CALCIUM 8.9  --  8.9  --  8.4*  --  8.7* 8.4*  MG 1.8  --   --   --   --   --   --   --   PHOS 3.4  --   --   --   --   --   --   --    < > = values in this interval not displayed.   GFR CrCl cannot be calculated (This lab value  cannot be used to calculate CrCl because it is not a number: <0.30).  Liver Function Tests: Recent Labs  Lab 07/29/20 1800 07/30/20 0543  AST 68* 62*  ALT 37 31  ALKPHOS 221* 209*  BILITOT 5.7* 6.2*  PROT 6.0* 5.7*  ALBUMIN 3.2* 3.1*    No results for input(s): AMMONIA in the last 168 hours.  CBC: Recent Labs  Lab 07/30/20 0543 07/31/20 0614 08/01/20 0459 08/02/20 1035 08/05/20 0525  WBC 14.6* 12.2* 14.7* 14.8* 11.6*  NEUTROABS  --  7.5 8.8* 8.6*  --   HGB 13.9 15.5* 14.4 14.1 13.3  HCT 41.7 46.0 43.5 43.9 40.1  MCV 96.3 97.3 98.2 100.0 98.0  PLT 170 155 161 140* 116*   Sepsis Labs: Recent Labs  Lab 07/31/20 0614 08/01/20 0459 08/02/20 1035 08/05/20 0525  WBC 12.2* 14.7* 14.8* 11.6*    Microbiology Recent Results (from the past 240 hour(s))  Resp Panel by RT-PCR (Flu A&B, Covid) Nasopharyngeal Swab     Status: None   Collection Time: 07/29/20  6:48 PM   Specimen: Nasopharyngeal Swab; Nasopharyngeal(NP) swabs in vial transport medium  Result Value Ref Range Status   SARS Coronavirus 2 by RT PCR NEGATIVE NEGATIVE Final    Comment: (NOTE) SARS-CoV-2 target nucleic acids are NOT DETECTED.  The SARS-CoV-2 RNA is generally detectable in upper respiratory specimens during the acute phase of infection. The lowest concentration of SARS-CoV-2 viral copies this assay can detect is 138 copies/mL. A negative result does not preclude SARS-Cov-2 infection and should not be used as the sole basis for treatment or other patient management decisions. A negative result may occur with  improper specimen collection/handling, submission of specimen other than nasopharyngeal swab, presence of viral mutation(s) within the areas targeted by this assay, and inadequate number of viral copies(<138 copies/mL). A negative result must be combined with clinical observations, patient history, and epidemiological information. The expected result is Negative.  Fact Sheet for Patients:   EntrepreneurPulse.com.au  Fact Sheet for Healthcare Providers:  IncredibleEmployment.be  This test is no t yet approved or cleared by the [place] FDA and  has been authorized for detection and/or diagnosis of SARS-CoV-2 by FDA under an Emergency Use Authorization (EUA). This EUA will remain  in effect (meaning this test can be used) for the duration of the COVID-19 declaration under Section 564(b)(1) of the Act, 21 U.S.C.section 360bbb-3(b)(1), unless the authorization is terminated  or revoked sooner.       Influenza A by PCR NEGATIVE NEGATIVE Final   Influenza B by PCR NEGATIVE NEGATIVE Final    Comment: (NOTE) The Xpert Xpress SARS-CoV-2/FLU/RSV plus assay is intended as an aid in the diagnosis of influenza from Nasopharyngeal swab specimens and should not be used as a sole basis for treatment. Nasal washings and aspirates are unacceptable for Xpert Xpress SARS-CoV-2/FLU/RSV testing.  Fact Sheet for Patients: EntrepreneurPulse.com.au  Fact Sheet for Healthcare Providers: IncredibleEmployment.be  This test is not yet approved or cleared by the [place] FDA and has been authorized for detection and/or diagnosis of SARS-CoV-2 by FDA under an Emergency Use Authorization (EUA). This EUA will remain in effect (meaning this test can be used) for the duration of the COVID-19 declaration under Section 564(b)(1) of the Act, 21 U.S.C. section 360bbb-3(b)(1), unless the authorization is terminated or revoked.  Performed at [facility], [address] [place], [place] [address]   Urine Culture     Status: Abnormal   Collection Time: 07/30/20  5:27 PM   Specimen: Urine, Random  Result Value Ref Range Status   Specimen Description  Final    URINE, RANDOM Performed at [facility], [address], [place], [place] [address]    Special Requests   Final    NONE Performed at  [facility], [address] [place], [place] [address]    Culture (A)  Final    <10,000 COLONIES/mL INSIGNIFICANT GROWTH Performed at [place] [address] [place], [place] [address]    Report Status 08/01/2020 FINAL  Final    Procedures and diagnostic studies:  No results found.    LOS: 7 days   George Hugh  [facility]   Pager on [email or web address]. If 7PM-7AM, please contact night-coverage at [email or web address]   08/05/2020, 5:45 PM

## 2020-08-05 NOTE — Progress Notes (Signed)
Physical Therapy Treatment Patient Details Name: Anita Reed MRN: [id number] DOB: 1961/06/27 Today's Date: 08/05/2020    History of Present Illness Pt is a 59 y/o F admitted on 07/29/20 with c/c of confusion. Pt has also had a couple of falls. Pt currently being treated for acute hepatic encephalopathy, alcoholic cirrhosis of liver with ascites. Pt also found to have a subaute L1 compression fx. PMH: alcoholic cirrhosis, paroxysmal a-fib not on anticoagulation, chronic hypotension on midodrine, GI bleed, depression, chronic pain, lymphoma, a-flutter, hepatitis, hiatal hernia    PT Comments    Pt agreed to PT session. Supine to sit with HOB up with Supervision.  Sitting EOB with good static unsupported sitting balance, sit to stand at RW with SBA and initial c/o dizziness.  Pt completed gait training with support of RW and CG/SBA for safety due to c/o dizziness at times and occasional sway. Pt tolerated session well, appears to be feeling better with less dizziness. Will continue skilled PT until medically stable to return home.   Follow Up Recommendations  Home health PT;Supervision for mobility/OOB     Equipment Recommendations  Rolling walker with 5" wheels    Recommendations for Other Services       Precautions / Restrictions Precautions Precautions: Fall    Mobility  Bed Mobility Overal bed mobility: Needs Assistance Bed Mobility: Supine to Sit;Sit to Supine     Supine to sit: Supervision;HOB elevated Sit to supine: Modified independent (Device/Increase time)   General bed mobility comments: increased time required to complete tasks   Transfers Overall transfer level: Needs assistance Equipment used: Rolling walker (2 wheeled) Transfers: Sit to/from [facility] Sit to Stand: Min guard Stand pivot transfers: Min guard          Ambulation/Gait Ambulation/Gait assistance: Min guard Gait Distance (Feet): 80 Feet Assistive device: Rolling walker (2  wheeled) Gait Pattern/deviations: Step-through pattern;Narrow base of support Gait velocity: decreased   General Gait Details: cues for safety and technique using rolling walker. Slightly unsteady at times, no loss of balance. Min guard for safety. slight dizziness reported upon standing   Stairs             Wheelchair Mobility    Modified Rankin (Stroke Patients Only)       Balance                                            Cognition Arousal/Alertness: Awake/alert Behavior During Therapy: WFL for tasks assessed/performed Overall Cognitive Status: Within Functional Limits for tasks assessed                                 General Comments: Pt oriented to location, situation, date.      Exercises      General Comments        Pertinent Vitals/Pain Pain Assessment: No/denies pain Pain Score: 2  Pain Location: low back Pain Descriptors / Indicators: Aching;Discomfort Pain Intervention(s): Repositioned;Relaxation    Home Living                      Prior Function            PT Goals (current goals can now be found in the care plan section) Acute Rehab PT Goals Patient Stated Goal: to go home  Frequency    Min 2X/week      PT Plan      Co-evaluation              AM-PAC PT "6 Clicks" Mobility   Outcome Measure  Help needed turning from your back to your side while in a flat bed without using bedrails?: A Anita Help needed moving from lying on your back to sitting on the side of a flat bed without using bedrails?: A Anita Help needed moving to and from a bed to a chair (including a wheelchair)?: A Anita Help needed standing up from a chair using your arms (e.g., wheelchair or bedside chair)?: A Anita Help needed to walk in hospital room?: A Anita Help needed climbing 3-5 steps with a railing? : A Anita 6 Click Score: 18    End of Session Equipment Utilized During Treatment: Gait  belt Activity Tolerance: Patient tolerated treatment well Patient left: in bed;with bed alarm set;with call bell/phone within reach Nurse Communication: Mobility status PT Visit Diagnosis: Unsteadiness on feet (R26.81);Muscle weakness (generalized) (M62.81);Difficulty in walking, not elsewhere classified (R26.2);History of falling (Z91.81)     Time: 1287-8676 PT Time Calculation (min) (ACUTE ONLY): 20 min  Charges:  $Gait Training: 8-22 mins                     Mikel Cella, PTA   Anita Reed 08/05/2020, 3:34 PM

## 2020-08-06 LAB — CBC
HCT: 43.1 % (ref 36.0–46.0)
Hemoglobin: 14.3 g/dL (ref 12.0–15.0)
MCH: 32.4 pg (ref 26.0–34.0)
MCHC: 33.2 g/dL (ref 30.0–36.0)
MCV: 97.7 fL (ref 80.0–100.0)
Platelets: 117 10*3/uL — ABNORMAL LOW (ref 150–400)
RBC: 4.41 MIL/uL (ref 3.87–5.11)
RDW: 17.3 % — ABNORMAL HIGH (ref 11.5–15.5)
WBC: 8.5 10*3/uL (ref 4.0–10.5)
nRBC: 0 % (ref 0.0–0.2)

## 2020-08-06 LAB — BASIC METABOLIC PANEL
Anion gap: 7 (ref 5–15)
BUN: 7 mg/dL (ref 6–20)
CO2: 30 mmol/L (ref 22–32)
Calcium: 8.6 mg/dL — ABNORMAL LOW (ref 8.9–10.3)
Chloride: 108 mmol/L (ref 98–111)
Creatinine, Ser: 0.38 mg/dL — ABNORMAL LOW (ref 0.44–1.00)
GFR, Estimated: >60 mL/min (ref >60)
Glucose, Bld: 81 mg/dL (ref 70–99)
Potassium: 3.5 mmol/L (ref 3.5–5.1)
Sodium: 145 mmol/L (ref 135–145)

## 2020-08-06 NOTE — Discharge Instructions (Signed)
Hepatic Encephalopathy  Hepatic encephalopathy is a loss of brain function due to advanced liver disease. When the liver is damaged, harmful substances (toxins) can build up in the body. Some of these toxins, such as ammonia, can harm the brain. The effects of the condition depend on the type of liver damage and how severe it is. In some cases, hepatic encephalopathy can be reversed. What are the causes? Certain things can trigger or worsen hepatic encephalopathy, such as:  Infection.  Constipation.  Taking certain medicines, such as benzodiazepines.  Alcohol use.  Bleeding into the intestinal tract.  Imbalances in minerals (electrolytes) in the body.  Dehydration. Hepatic encephalopathy can sometimes be reversed if these triggers are resolved. What increases the risk? You are at risk of developing this condition if you have advanced liver disease (cirrhosis). Conditions that can cause liver disease include:  Infections in the liver, such as hepatitis C.  Infections in the blood.  Drinking a lot of alcohol over a long period of time.  Taking certain medicines, including tranquilizers, diuretics, antidepressants, sleeping pills, or acetaminophen.  Genetic diseases, such as Wilson's disease. What are the signs or symptoms? Symptoms may develop suddenly. Or, they may develop slowly and get worse gradually. Symptoms can range from mild to severe. Mild symptoms include:  Mild confusion.  Shortened attention span.  Personality and mood changes.  Anxiety and agitation.  Drowsiness. Symptoms of worsening or severe hepatic encephalopathy include:  Extreme confusion (disorientation).  Slowed movement.  Slurred speech.  Extreme personality changes.  Abnormal shaking or flapping of the hands.  Coma. How is this diagnosed? This condition may be diagnosed based on:  A physical exam.  Your symptoms and medical history.  Blood tests. These may be done to check levels  of ammonia in your blood, measure how long it takes your blood to clot, or check for infection.  Liver function tests. These may be done to check how well your liver is working.  MRI and CT scans. These may be done to check for a brain disorder and to check for problems with your liver.  Electroencephalogram (EEG). This test measures the electrical activity in your brain. How is this treated? The first step in treatment is to identify and treat the cause of your liver damage or triggering illness, if possible. The next step is taking medicine to lower the level of toxins in your body and prevent ammonia from building up. Treatment will depend on how severe your encephalopathy is, and may include:  Medicine to lower your ammonia level (lactulose).  Antibiotic medicine to reduce the amount of ammonia-producing bacteria in your gut.  Close monitoring of your blood pressure, heart rate, breathing, and oxygen levels.  Removal of fluid from your abdomen.  Close monitoring of how you think, feel, and act (mental status).  Dietary changes.  Liver transplant, in severe cases. Follow these instructions at home: Eating and drinking   Work with a dietitian or with your health care provider to make sure you are getting the right balance of protein and minerals.  Drink enough fluids to keep your urine pale yellow.  Do not drink alcohol or use drugs. General instructions  If you were prescribed an antibiotic, take it as told by your health care provider. Do not stop taking the antibiotic even if your condition improves.  Take other over-the-counter and prescription medicines only as told by your health care provider.  Do not start taking any new medicines, including over-the-counter medicines, without first   checking with your health care provider.  Keep all follow-up visits as told by your health care provider. This is important. Contact a health care provider if:  You develop new  symptoms.  Your symptoms change or get worse.  You have a fever.  You are constipated. Signs of constipation include having: ? Fewer bowel movements in a week than normal. ? Trouble having a bowel movement. ? Stools that are dry, hard, or larger than normal.  You have persistent nausea, vomiting, or diarrhea. Get help right away if:  You become very confused or drowsy.  You vomit blood or material that looks like coffee grounds.  Your stool is bloody, black, or looks like tar. Summary  Hepatic encephalopathy is a loss of brain function due to advanced liver disease. When the liver is damaged, harmful substances (toxins) can build up in your body. Some of these toxins, such as ammonia, can harm your brain.  Certain things can trigger or worsen hepatic encephalopathy. Hepatic encephalopathy can sometimes be reversed if these triggers are resolved.  The first step in treatment is to identify and treat the cause of your liver damage or triggering illness, if possible. The next step is taking medicine to lower the level of toxins in your body and prevent ammonia from building up.  Your treatment will depend on how severe your hepatic encephalopathy is. This information is not intended to replace advice given to you by your health care provider. Make sure you discuss any questions you have with your health care provider. Document Revised: 07/29/2017 Document Reviewed: 05/17/2017 Elsevier Patient Education  2020 [employer] Inc.  

## 2020-08-06 NOTE — Progress Notes (Signed)
[name] to be D/C'd home with husband per MD order.  Discussed prescriptions and follow up appointments with the patient. Prescriptions given to patient, medication list explained in detail. Pt verbalized understanding.  Allergies as of 08/06/2020   No Known Allergies      Medication List     TAKE these medications    digoxin 0.125 MG tablet Commonly known as: LANOXIN Take 1 tablet by mouth once daily   FLUoxetine 20 MG tablet Commonly known as: PROZAC Take 1 tablet by mouth once daily   furosemide 40 MG tablet Commonly known as: LASIX Take 1 tablet (40 mg total) by mouth daily.   gabapentin 400 MG capsule Commonly known as: NEURONTIN Take 1 capsule (400 mg total) by mouth 3 (three) times daily.   lactulose 10 GM/15ML solution Commonly known as: CHRONULAC Take 45 mLs (30 g total) by mouth 3 (three) times daily.   melatonin 5 MG Tabs Take 5 mg by mouth at bedtime as needed.   midodrine 10 MG tablet Commonly known as: PROAMATINE Take 1 tablet (10 mg total) by mouth 3 (three) times daily with meals.   pantoprazole 20 MG tablet Commonly known as: Protonix Take 1 tablet (20 mg total) by mouth every morning. One hour before breakfast   potassium chloride 10 MEQ tablet Commonly known as: KLOR-CON Take 1 tablet (10 mEq total) by mouth daily.   tiZANidine 4 MG capsule Commonly known as: ZANAFLEX TAKE 1 CAPSULE BY MOUTH THREE TIMES DAILY AS NEEDED FOR MUSCLE SPASM        Vitals:   08/06/20 0800 08/06/20 1106  BP: (!) 70/46 101/70  Pulse: 68 74  Resp: 20 16  Temp: 97.9 F (36.6 C) 98.2 F (36.8 C)  SpO2: 94% 90%    Skin clean, dry and intact without evidence of skin break down, no evidence of skin tears noted. IV catheter discontinued intact. Site without signs and symptoms of complications. Dressing and pressure applied. Pt denies pain at this time. No complaints noted.  An After Visit Summary was printed and given to the patient. Patient escorted via  [place], and D/C home via private auto.  Arispe A Cha Gomillion

## 2020-08-06 NOTE — TOC Transition Note (Signed)
Transition of Care [facility]) - CM/SW Discharge Note   Patient Details  Name: Anessa Charley MRN: [id number] Date of Birth: Mar 05, 1961  [facility] CM/SW Contact:  Beverly Sessions, RN Phone Number: 08/06/2020, 8:59 AM   Clinical Narrative:    Patient to discharge home today.   Corene Cornea with [facility] notified of discharge I have requested for MD to enter home health orders prior to discharge   Rolling walker and BSC was delivered to room on Monday.    Final next level of care: [place] Barriers to Discharge: No Barriers Identified   Patient Goals and CMS Choice Patient states their goals for this hospitalization and ongoing recovery are:: I want to go home   Choice offered to / list presented to : Patient  Discharge Placement                       Discharge Plan and Services In-house Referral: Clinical Social Work   Post Acute Care Choice: Home Health          DME Arranged: Bedside commode, Walker rolling DME Agency: Other - Comment Physicist, medical Brenton Grills)) Date DME Agency Contacted: 08/03/20 Time DME Agency Contacted: [id number]   Lafe, OT, PT [place] Agency: [place] ([place]) Date [place]: 08/06/20 Time [place]: 1624 Representative spoke with at [place]: [place] ([place]) Interventions     Readmission Risk Interventions Readmission Risk Prevention Plan 07/18/2019 07/15/2019  Transportation Screening Complete Complete  PCP or Specialist Appt within 3-5 Days - Complete  HRI or [place] - Complete  Social Work Consult for [place] Planning/Counseling - Patient refused  Palliative Care Screening - Complete  Medication Review [employer] photographer) Complete Complete  PCP or Specialist appointment within 3-5 days of discharge (No Data) -  [place] or Home Care Consult Complete -  Palliative Care Screening Complete -  Industry Not  Applicable -  Some recent data might be hidden

## 2020-08-06 NOTE — Discharge Summary (Signed)
Physician Discharge Summary  Anita Reed NIO:[id number] DOB: [birth year] DOA: 07/29/2020  PCP: Towanda Malkin, MD  Admit date: 07/29/2020 Discharge date: 08/06/2020  Admitted From: Home Disposition: Home  Recommendations for Outpatient Follow-up:  1. Follow up with PCP in 1-2 weeks 2. Please obtain BMP/CBC in one week 3. Please follow up with your PCP on the following pending results: Unresulted Labs (From admission, onward)          Start     Ordered   08/05/20 0500  Creatinine, serum  (enoxaparin (LOVENOX)    CrCl >/= 30 ml/min)  Weekly,   STAT     Comments: while on enoxaparin therapy    07/29/20 2018           Home Health: Yes Equipment/Devices: None  Discharge Condition: Stable CODE STATUS: Full code Diet recommendation: Cardiac  Subjective: Seen and examined.  Feels much better.  Alert and oriented but slightly tired.  No other complaint.  Wants to go home.  Brief/Interim Summary: Anita Reed is a 59 y.o. female with medical history significant foralcoholic cirrhosis, paroxysmal A. fib not on anticoagulation due to history of variceal bleeds in 2020, chronic hypotension on midodrine, chronic lower back pain who presented to the ED on 11/30 with acute confusion found to have hepatic encephalopathy with Ammonia level 46 and mild hypernatremia with sodium level 145 mmol/L. She also has chronic intermittent dizziness and has been falling at home. She was found to have traumatic compression fracture of L1 vertebra, which is being managed conservatively.  She was continued on diuretics and lactulose.  Her mental status has improved and she is back to her baseline of being alert and oriented.  She does not have any lower back pain.  She was seen by PT OT and they recommended home health which has been arranged for her.  MRI of brain was also negative for any acute pathology.  Electrolytes were replaced.  She is a stable and is being discharged home.  Discharge  Diagnoses:  Active Problems:   Alcoholic cirrhosis of liver with ascites (HCC)    Alcoholic Cirrhosis of liver without ascites (HCC)   History of GI bleed   Underweight   Falls, initial encounter   Acute hepatic encephalopathy   Traumatic compression fracture of L1 lumbar vertebra (HCC)   Elevated troponin   Leucocytosis   AF (paroxysmal atrial fibrillation) ([place])    Discharge Instructions   Allergies as of 08/06/2020   No Known Allergies     Medication List    TAKE these medications   digoxin 0.125 MG tablet Commonly known as: LANOXIN Take 1 tablet by mouth once daily   FLUoxetine 20 MG tablet Commonly known as: PROZAC Take 1 tablet by mouth once daily   furosemide 40 MG tablet Commonly known as: LASIX Take 1 tablet (40 mg total) by mouth daily.   gabapentin 400 MG capsule Commonly known as: NEURONTIN Take 1 capsule (400 mg total) by mouth 3 (three) times daily.   lactulose 10 GM/15ML solution Commonly known as: CHRONULAC Take 45 mLs (30 g total) by mouth 3 (three) times daily.   melatonin 5 MG Tabs Take 5 mg by mouth at bedtime as needed.   midodrine 10 MG tablet Commonly known as: PROAMATINE Take 1 tablet (10 mg total) by mouth 3 (three) times daily with meals.   pantoprazole 20 MG tablet Commonly known as: Protonix Take 1 tablet (20 mg total) by mouth every morning. One hour before breakfast  potassium chloride 10 MEQ tablet Commonly known as: KLOR-CON Take 1 tablet (10 mEq total) by mouth daily.   tiZANidine 4 MG capsule Commonly known as: ZANAFLEX TAKE 1 CAPSULE BY MOUTH THREE TIMES DAILY AS NEEDED FOR MUSCLE SPASM       Follow-up Information    Towanda Malkin, MD Follow up in 1 week(s).   Specialty: Internal Medicine Contact information: [address] [place] [address] [phone]        Minna Merritts, MD .   Specialty: Cardiology Contact information: [place]  [address] [phone]              No Known Allergies  Consultations: None   Procedures/Studies: DG Chest 2 View  Result Date: 07/29/2020 CLINICAL DATA:  Chest pain, dizziness and shortness of breath EXAM: CHEST - 2 VIEW COMPARISON:  June 02, 2020 FINDINGS: Trachea is midline. Cardiomediastinal contours are stable accounting for partially obscured LEFT heart border due to elevated LEFT hemidiaphragm and LEFT lower lobe airspace disease. Findings likely associated with small LEFT effusion though not to the extent that was seen on the study of June 02, 2020. RIGHT lung is clear. On limited assessment no acute skeletal process. IMPRESSION: LEFT lower lobe airspace disease with small LEFT effusion, pleural fluid diminished since October of 2021. Cardiomegaly as before. Elevated LEFT hemidiaphragm as before. Electronically Signed   By: Zetta Bills M.D.   On: 07/29/2020 16:24   DG Lumbar Spine Complete  Result Date: 07/29/2020 CLINICAL DATA:  Low back pain after fall. EXAM: LUMBAR SPINE - COMPLETE 4+ VIEW COMPARISON:  May 30, 2020. FINDINGS: There is interval development of moderate compression deformity involving the L2 vertebral body consistent with acute to subacute fracture. No spondylolisthesis is noted. Mild degenerative disc disease is noted at L5-S1 with anterior osteophyte formation. IMPRESSION: Interval development of moderate compression deformity involving L2 vertebral body consistent with acute to subacute fracture. MRI may be performed for further evaluation. Electronically Signed   By: Marijo Conception M.D.   On: 07/29/2020 19:18   CT Head Wo Contrast  Result Date: 07/29/2020 CLINICAL DATA:  Dizziness, nonspecific; headache, classic migraine. Additional history provided: Patient reports dizziness with fall. EXAM: CT HEAD WITHOUT CONTRAST TECHNIQUE: Contiguous axial images were obtained from the base of the skull through the vertex without intravenous contrast. COMPARISON:   No pertinent prior exams available for comparison. FINDINGS: Brain: Mild cerebral and cerebellar atrophy. Mild ill-defined hypoattenuation within the cerebral white matter is nonspecific, but compatible with chronic small vessel ischemic disease. There is no acute intracranial hemorrhage. No demarcated cortical infarct. No extra-axial fluid collection. No evidence of intracranial mass. No midline shift. Vascular: No hyperdense vessel.  Atherosclerotic calcifications. Skull: No calvarial fracture or focal suspicious osseous lesion. Sinuses/Orbits: Visualized orbits show no acute finding. Trace ethmoid and right maxillary sinus mucosal thickening at the imaged levels. IMPRESSION: No evidence of acute intracranial abnormality. Mild atrophy of the brain and chronic small vessel ischemic disease. Electronically Signed   By: Kellie Simmering DO   On: 07/29/2020 16:44   MR BRAIN W WO CONTRAST  Result Date: 08/05/2020 CLINICAL DATA:  59 year old female with dizziness. Paroxysmal atrial fibrillation. Cirrhosis. EXAM: MRI HEAD WITHOUT AND WITH CONTRAST TECHNIQUE: Multiplanar, multiecho pulse sequences of the brain and surrounding structures were obtained without and with intravenous contrast. CONTRAST:  A total of 12 mL GADAVIST GADOBUTROL 1 MMOL/ML IV SOLN, after the initial dose of 6 mL failed  to demonstrate any soft tissue enhancement (presumed small volume contrast extravasation) at that IV site. COMPARISON:  Head CT 07/29/2020. FINDINGS: Study is intermittently degraded by motion artifact despite repeated imaging attempts. Brain: No restricted diffusion to suggest acute infarction. No midline shift, mass effect, evidence of mass lesion, ventriculomegaly, extra-axial collection or acute intracranial hemorrhage. Cervicomedullary junction and pituitary are within normal limits. Cerebral volume loss appears somewhat advanced for age. There is abnormal intrinsic increased T1 signal in the globus pallidus (series 17, image  12). Superimposed moderate patchy, scattered bilateral nonspecific white matter T2 and FLAIR hyperintensity. No cortical encephalomalacia or chronic cerebral blood products identified. No abnormal enhancement identified.  No dural thickening. Vascular: Major intracranial vascular flow voids are grossly preserved. Skull and upper cervical spine: Limited detail of the cervical spine due to motion on sagittal images. Visualized bone marrow signal is within normal limits. Sinuses/Orbits: Negative orbits. Trace paranasal sinus mucosal thickening. Other: Visible internal auditory structures appear grossly normal, well aerated mastoid air cells. Normal stylomastoid foramina. Negative visible scalp and face. IMPRESSION: 1. Intermittently motion degraded.  Negative for acute infarct. 2. Abnormal intrinsic T1 signal in the globus pallidus, compatible with cirrhosis. Correlation with serum ammonia levels may be valuable. 3. Superimposed generalized cerebral volume loss. Moderate nonspecific cerebral white matter signal changes, most commonly due to chronic small vessel disease. Electronically Signed   By: Genevie Ann M.D.   On: 08/05/2020 23:29      Discharge Exam: Vitals:   08/06/20 0800 08/06/20 1106  BP: (!) 70/46 101/70  Pulse: 68 74  Resp: 20 16  Temp: 97.9 F (36.6 C) 98.2 F (36.8 C)  SpO2: 94% 90%   Vitals:   08/05/20 2314 08/06/20 0443 08/06/20 0800 08/06/20 1106  BP: 103/60 120/68 (!) 70/46 101/70  Pulse: 88 (!) 101 68 74  Resp: 16 20 20 16   Temp: 98 F (36.7 C) 98.7 F (37.1 C) 97.9 F (36.6 C) 98.2 F (36.8 C)  TempSrc: Oral Oral Oral Oral  SpO2: 100% 93% 94% 90%  Weight:      Height:        General: Pt is alert, awake, not in acute distress Cardiovascular: RRR, S1/S2 +, no rubs, no gallops Respiratory: CTA bilaterally, no wheezing, no rhonchi Abdominal: Soft, NT, ND, bowel sounds + Extremities: no edema, no cyanosis    The results of significant diagnostics from this  hospitalization (including imaging, microbiology, ancillary and laboratory) are listed below for reference.     Microbiology: Recent Results (from the past 240 hour(s))  Resp Panel by RT-PCR (Flu A&B, Covid) Nasopharyngeal Swab     Status: None   Collection Time: 07/29/20  6:48 PM   Specimen: Nasopharyngeal Swab; Nasopharyngeal(NP) swabs in vial transport medium  Result Value Ref Range Status   SARS Coronavirus 2 by RT PCR NEGATIVE NEGATIVE Final    Comment: (NOTE) SARS-CoV-2 target nucleic acids are NOT DETECTED.  The SARS-CoV-2 RNA is generally detectable in upper respiratory specimens during the acute phase of infection. The lowest concentration of SARS-CoV-2 viral copies this assay can detect is 138 copies/mL. A negative result does not preclude SARS-Cov-2 infection and should not be used as the sole basis for treatment or other patient management decisions. A negative result may occur with  improper specimen collection/handling, submission of specimen other than nasopharyngeal swab, presence of viral mutation(s) within the areas targeted by this assay, and inadequate number of viral copies(<138 copies/mL). A negative result must be combined with clinical observations, patient history,  and epidemiological information. The expected result is Negative.  Fact Sheet for Patients:  EntrepreneurPulse.com.au  Fact Sheet for Healthcare Providers:  IncredibleEmployment.be  This test is no t yet approved or cleared by the [place] FDA and  has been authorized for detection and/or diagnosis of SARS-CoV-2 by FDA under an Emergency Use Authorization (EUA). This EUA will remain  in effect (meaning this test can be used) for the duration of the COVID-19 declaration under Section 564(b)(1) of the Act, 21 U.S.C.section 360bbb-3(b)(1), unless the authorization is terminated  or revoked sooner.       Influenza A by PCR NEGATIVE NEGATIVE Final    Influenza B by PCR NEGATIVE NEGATIVE Final    Comment: (NOTE) The Xpert Xpress SARS-CoV-2/FLU/RSV plus assay is intended as an aid in the diagnosis of influenza from Nasopharyngeal swab specimens and should not be used as a sole basis for treatment. Nasal washings and aspirates are unacceptable for Xpert Xpress SARS-CoV-2/FLU/RSV testing.  Fact Sheet for Patients: EntrepreneurPulse.com.au  Fact Sheet for Healthcare Providers: IncredibleEmployment.be  This test is not yet approved or cleared by the [place] FDA and has been authorized for detection and/or diagnosis of SARS-CoV-2 by FDA under an Emergency Use Authorization (EUA). This EUA will remain in effect (meaning this test can be used) for the duration of the COVID-19 declaration under Section 564(b)(1) of the Act, 21 U.S.C. section 360bbb-3(b)(1), unless the authorization is terminated or revoked.  Performed at [facility], [address], [place], [place] [address]   Urine Culture     Status: Abnormal   Collection Time: 07/30/20  5:27 PM   Specimen: Urine, Random  Result Value Ref Range Status   Specimen Description   Final    URINE, RANDOM Performed at [facility], [address] [place], [place] [address]    Special Requests   Final    NONE Performed at [facility], [address] [place], [place] [address]    Culture (A)  Final    <10,000 COLONIES/mL INSIGNIFICANT GROWTH Performed at [facility], [place] [address] [place], [place] [address]    Report Status 08/01/2020 FINAL  Final     Labs: BNP (last 3 results) No results for input(s): BNP in the last 8760 hours. Basic Metabolic Panel: Recent Labs  Lab 07/31/20 0614 08/01/20 0459 08/02/20 1035 08/05/20 0525 08/06/20 0515  NA 148* 145 144 143 145  K 3.1* 3.6 4.1 3.6 3.5  CL 109 107 111 109 108  CO2 27 26 25 26 30   GLUCOSE 82 107* 97 119* 81  BUN 9 7 7 9 7   CREATININE  0.55 0.44 0.36* <0.30* 0.38*  CALCIUM 8.9 8.4* 8.7* 8.4* 8.6*   Liver Function Tests: No results for input(s): AST, ALT, ALKPHOS, BILITOT, PROT, ALBUMIN in the last 168 hours. No results for input(s): LIPASE, AMYLASE in the last 168 hours. No results for input(s): AMMONIA in the last 168 hours. CBC: Recent Labs  Lab 07/31/20 0614 08/01/20 0459 08/02/20 1035 08/05/20 0525 08/06/20 0515  WBC 12.2* 14.7* 14.8* 11.6* 8.5  NEUTROABS 7.5 8.8* 8.6*  --   --   HGB 15.5* 14.4 14.1 13.3 14.3  HCT 46.0 43.5 43.9 40.1 43.1  MCV 97.3 98.2 100.0 98.0 97.7  PLT 155 161 140* 116* 117*   Cardiac Enzymes: No results for input(s): CKTOTAL, CKMB, CKMBINDEX, TROPONINI in the last 168 hours. BNP: Invalid input(s): POCBNP CBG: No results for input(s): GLUCAP in the last 168 hours. D-Dimer  No results for input(s): DDIMER in the last 72 hours. Hgb A1c No results for input(s): HGBA1C in the last 72 hours. Lipid Profile No results for input(s): CHOL, HDL, LDLCALC, TRIG, CHOLHDL, LDLDIRECT in the last 72 hours. Thyroid function studies No results for input(s): TSH, T4TOTAL, T3FREE, THYROIDAB in the last 72 hours.  Invalid input(s): FREET3 Anemia work up No results for input(s): VITAMINB12, FOLATE, FERRITIN, TIBC, IRON, RETICCTPCT in the last 72 hours. Urinalysis    Component Value Date/Time   COLORURINE AMBER (A) 07/30/2020 0400   APPEARANCEUR HAZY (A) 07/30/2020 0400   LABSPEC 1.028 07/30/2020 0400   PHURINE 6.0 07/30/2020 0400   GLUCOSEU NEGATIVE 07/30/2020 0400   HGBUR NEGATIVE 07/30/2020 0400   BILIRUBINUR SMALL (A) 07/30/2020 0400   KETONESUR 5 (A) 07/30/2020 0400   PROTEINUR 30 (A) 07/30/2020 0400   NITRITE NEGATIVE 07/30/2020 0400   LEUKOCYTESUR SMALL (A) 07/30/2020 0400   Sepsis Labs Invalid input(s): PROCALCITONIN,  WBC,  LACTICIDVEN Microbiology Recent Results (from the past 240 hour(s))  Resp Panel by RT-PCR (Flu A&B, Covid) Nasopharyngeal Swab     Status: None   Collection  Time: 07/29/20  6:48 PM   Specimen: Nasopharyngeal Swab; Nasopharyngeal(NP) swabs in vial transport medium  Result Value Ref Range Status   SARS Coronavirus 2 by RT PCR NEGATIVE NEGATIVE Final    Comment: (NOTE) SARS-CoV-2 target nucleic acids are NOT DETECTED.  The SARS-CoV-2 RNA is generally detectable in upper respiratory specimens during the acute phase of infection. The lowest concentration of SARS-CoV-2 viral copies this assay can detect is 138 copies/mL. A negative result does not preclude SARS-Cov-2 infection and should not be used as the sole basis for treatment or other patient management decisions. A negative result may occur with  improper specimen collection/handling, submission of specimen other than nasopharyngeal swab, presence of viral mutation(s) within the areas targeted by this assay, and inadequate number of viral copies(<138 copies/mL). A negative result must be combined with clinical observations, patient history, and epidemiological information. The expected result is Negative.  Fact Sheet for Patients:  EntrepreneurPulse.com.au  Fact Sheet for Healthcare Providers:  IncredibleEmployment.be  This test is no t yet approved or cleared by the [place] FDA and  has been authorized for detection and/or diagnosis of SARS-CoV-2 by FDA under an Emergency Use Authorization (EUA). This EUA will remain  in effect (meaning this test can be used) for the duration of the COVID-19 declaration under Section 564(b)(1) of the Act, 21 U.S.C.section 360bbb-3(b)(1), unless the authorization is terminated  or revoked sooner.       Influenza A by PCR NEGATIVE NEGATIVE Final   Influenza B by PCR NEGATIVE NEGATIVE Final    Comment: (NOTE) The Xpert Xpress SARS-CoV-2/FLU/RSV plus assay is intended as an aid in the diagnosis of influenza from Nasopharyngeal swab specimens and should not be used as a sole basis for treatment. Nasal washings  and aspirates are unacceptable for Xpert Xpress SARS-CoV-2/FLU/RSV testing.  Fact Sheet for Patients: EntrepreneurPulse.com.au  Fact Sheet for Healthcare Providers: IncredibleEmployment.be  This test is not yet approved or cleared by the [place] FDA and has been authorized for detection and/or diagnosis of SARS-CoV-2 by FDA under an Emergency Use Authorization (EUA). This EUA will remain in effect (meaning this test can be used) for the duration of the COVID-19 declaration under Section 564(b)(1) of the Act, 21 U.S.C. section 360bbb-3(b)(1), unless the authorization is terminated or revoked.  Performed at [facility], [address] [place], [place] [address]  Urine Culture     Status: Abnormal   Collection Time: 07/30/20  5:27 PM   Specimen: Urine, Random  Result Value Ref Range Status   Specimen Description   Final    URINE, RANDOM Performed at [facility], [address] [place], [place] [address]    Special Requests   Final    NONE Performed at [facility], [address] [place], [place] [address]    Culture (A)  Final    <10,000 COLONIES/mL INSIGNIFICANT GROWTH Performed at [facility] [address], [place], [place] [address]    Report Status 08/01/2020 FINAL  Final     Time coordinating discharge: Over 30 minutes  SIGNED:   Darliss Cheney, MD  [facility] 08/06/2020, 11:13 AM  If 7PM-7AM, please contact night-coverage [email or web address]

## 2020-08-07 ENCOUNTER — Telehealth: Payer: Self-pay

## 2020-08-07 NOTE — Telephone Encounter (Signed)
Transition Care Management Unsuccessful Follow-up Telephone Call  Date of discharge and from where:  08/06/20 [facility]  Attempts:  1st Attempt  Reason for unsuccessful TCM follow-up call:  Left voice message. Pt may reach me directly at [phone] or contact the office to schedule hospital follow up appt with Dr. Roxan Hockey on/around 08/13/20.

## 2020-08-11 ENCOUNTER — Telehealth: Payer: Self-pay

## 2020-08-11 NOTE — Telephone Encounter (Signed)
Copied from [place] [id number]. Topic: General - Other >> Aug 11, 2020  2:52 PM Leward Quan A wrote: Reason for CRM: Patient called in to inform  Dr Roxan Hockey that upon leaving the hospital she was not given anything for pain and was on Vicodin and asking for a Rx sent to the pharmacy  for this medication. Please call patient with an answer  Ph# [phone]

## 2020-08-11 NOTE — Progress Notes (Signed)
Name: Anita Reed   MRN: [id number]    DOB: 1960/10/05   Date:08/12/2020       Progress Note  Subjective  Chief Complaint  Chief Complaint  Patient presents with  . Hospitalization Follow-up  . Low blood pressure    I connected with  Anita Reed on 08/12/20 at 11:20 AM EST by telephone and verified that I am speaking with the correct person using two identifiers.  I discussed the limitations, risks, security and privacy concerns of performing an evaluation and management service by telephone and the availability of in person appointments. The patient expressed understanding and agreed to proceed. Staff also discussed with the patient that there may be a patient responsible charge related to this service. Patient Location: Home Provider Location: [facility] Additional Individuals present: none  HPI  Patient is a 59 y.o. female F/u today for a post hosp visit via telephone visit,  Marked limitations noting this including inability to get f/u lab tests today Details of hosp as noted in D/C summary:  Admit date: 07/29/2020 Discharge date: 08/06/2020  Admitted From: Home Disposition: Home  Recommendations for Outpatient Follow-up:  1. Follow up with PCP in 1-2 weeks 2. Please obtain BMP/CBC in one week  Home Health: Yes Equipment/Devices: None  Discharge Condition: Stable CODE STATUS: Full code Diet recommendation: Cardiac  Subjective: Seen and examined.  Feels much better.  Alert and oriented but slightly tired.  No other complaint.  Wants to go home.  Brief/Interim Summary: Anita Reed a 60 y.o.femalewith medical history significant foralcoholic cirrhosis, paroxysmal A. fib not on anticoagulationdue to history of variceal bleeds in 2020, chronic hypotension on midodrine,chronic lower back painwho presented to the ED on 11/30 with acute confusionfound to have hepatic encephalopathywith Ammonia level 46 and mild hypernatremia with sodium level 145 mmol/L.  She also has chronic intermittent dizziness and has been falling at home. She was found to have traumatic compression fracture of L1 vertebra, which is being managed conservatively.  She was continued on diuretics and lactulose.  Her mental status has improved and she is back to her baseline of being alert and oriented.  She does not have any lower back pain.  She was seen by PT OT and they recommended home health which has been arranged for her.  MRI of brain was also negative for any acute pathology.  Electrolytes were replaced.  She is a stable and is being discharged home.  Discharge Diagnoses:  Active Problems:   Alcoholic cirrhosis of liver with ascites (HCC)    Alcoholic Cirrhosis of liver without ascites (HCC)   History of GI bleed   Underweight   Falls, initial encounter   Acute hepatic encephalopathy   Traumatic compression fracture of L1 lumbar vertebra (HCC)   Elevated troponin   Leucocytosis   AF (paroxysmal atrial fibrillation) ([place])  Discharge Instructions  Allergies as of 08/06/2020   No Known Allergies        Medication List    TAKE these medications   digoxin 0.125 MG tablet Commonly known as: LANOXIN Take 1 tablet by mouth once daily   FLUoxetine 20 MG tablet Commonly known as: PROZAC Take 1 tablet by mouth once daily   furosemide 40 MG tablet Commonly known as: LASIX Take 1 tablet (40 mg total) by mouth daily.   gabapentin 400 MG capsule Commonly known as: NEURONTIN Take 1 capsule (400 mg total) by mouth 3 (three) times daily.   lactulose 10 GM/15ML solution Commonly known as: CHRONULAC  Take 45 mLs (30 g total) by mouth 3 (three) times daily.   melatonin 5 MG Tabs Take 5 mg by mouth at bedtime as needed.   midodrine 10 MG tablet Commonly known as: PROAMATINE Take 1 tablet (10 mg total) by mouth 3 (three) times daily with meals.   pantoprazole 20 MG tablet Commonly known as: Protonix Take 1 tablet (20 mg total) by mouth every  morning. One hour before breakfast   potassium chloride 10 MEQ tablet Commonly known as: KLOR-CON Take 1 tablet (10 mEq total) by mouth daily.   tiZANidine 4 MG capsule Commonly known as: ZANAFLEX TAKE 1 CAPSULE BY MOUTH THREE TIMES DAILY AS NEEDED FOR MUSCLE SPASM       No Known Allergies  Consultations: None   Procedures/Studies: Imaging Results   DG Chest 2 View  Result Date: 07/29/2020 CLINICAL DATA:  Chest pain, dizziness and shortness of breath EXAM: CHEST - 2 VIEW COMPARISON:  June 02, 2020 FINDINGS: Trachea is midline. Cardiomediastinal contours are stable accounting for partially obscured LEFT heart border due to elevated LEFT hemidiaphragm and LEFT lower lobe airspace disease. Findings likely associated with small LEFT effusion though not to the extent that was seen on the study of June 02, 2020. RIGHT lung is clear. On limited assessment no acute skeletal process. IMPRESSION: LEFT lower lobe airspace disease with small LEFT effusion, pleural fluid diminished since October of 2021. Cardiomegaly as before. Elevated LEFT hemidiaphragm as before. Electronically Signed   By: Zetta Bills M.D.   On: 07/29/2020 16:24   DG Lumbar Spine Complete  Result Date: 07/29/2020 CLINICAL DATA:  Low back pain after fall. EXAM: LUMBAR SPINE - COMPLETE 4+ VIEW COMPARISON:  May 30, 2020. FINDINGS: There is interval development of moderate compression deformity involving the L2 vertebral body consistent with acute to subacute fracture. No spondylolisthesis is noted. Mild degenerative disc disease is noted at L5-S1 with anterior osteophyte formation. IMPRESSION: Interval development of moderate compression deformity involving L2 vertebral body consistent with acute to subacute fracture. MRI may be performed for further evaluation. Electronically Signed   By: Marijo Conception M.D.   On: 07/29/2020 19:18   CT Head Wo Contrast  Result Date: 07/29/2020 CLINICAL DATA:  Dizziness,  nonspecific; headache, classic migraine. Additional history provided: Patient reports dizziness with fall. EXAM: CT HEAD WITHOUT CONTRAST TECHNIQUE: Contiguous axial images were obtained from the base of the skull through the vertex without intravenous contrast. COMPARISON:  No pertinent prior exams available for comparison. FINDINGS: Brain: Mild cerebral and cerebellar atrophy. Mild ill-defined hypoattenuation within the cerebral white matter is nonspecific, but compatible with chronic small vessel ischemic disease. There is no acute intracranial hemorrhage. No demarcated cortical infarct. No extra-axial fluid collection. No evidence of intracranial mass. No midline shift. Vascular: No hyperdense vessel.  Atherosclerotic calcifications. Skull: No calvarial fracture or focal suspicious osseous lesion. Sinuses/Orbits: Visualized orbits show no acute finding. Trace ethmoid and right maxillary sinus mucosal thickening at the imaged levels. IMPRESSION: No evidence of acute intracranial abnormality. Mild atrophy of the brain and chronic small vessel ischemic disease. Electronically Signed   By: Kellie Simmering DO   On: 07/29/2020 16:44   MR BRAIN W WO CONTRAST  Result Date: 08/05/2020 CLINICAL DATA:  59 year old female with dizziness. Paroxysmal atrial fibrillation. Cirrhosis. EXAM: MRI HEAD WITHOUT AND WITH CONTRAST TECHNIQUE: Multiplanar, multiecho pulse sequences of the brain and surrounding structures were obtained without and with intravenous contrast. CONTRAST:  A total of 12 mL GADAVIST GADOBUTROL 1 MMOL/ML  IV SOLN, after the initial dose of 6 mL failed to demonstrate any soft tissue enhancement (presumed small volume contrast extravasation) at that IV site. COMPARISON:  Head CT 07/29/2020. FINDINGS: Study is intermittently degraded by motion artifact despite repeated imaging attempts. Brain: No restricted diffusion to suggest acute infarction. No midline shift, mass effect, evidence of mass lesion,  ventriculomegaly, extra-axial collection or acute intracranial hemorrhage. Cervicomedullary junction and pituitary are within normal limits. Cerebral volume loss appears somewhat advanced for age. There is abnormal intrinsic increased T1 signal in the globus pallidus (series 17, image 12). Superimposed moderate patchy, scattered bilateral nonspecific white matter T2 and FLAIR hyperintensity. No cortical encephalomalacia or chronic cerebral blood products identified. No abnormal enhancement identified.  No dural thickening. Vascular: Major intracranial vascular flow voids are grossly preserved. Skull and upper cervical spine: Limited detail of the cervical spine due to motion on sagittal images. Visualized bone marrow signal is within normal limits. Sinuses/Orbits: Negative orbits. Trace paranasal sinus mucosal thickening. Other: Visible internal auditory structures appear grossly normal, well aerated mastoid air cells. Normal stylomastoid foramina. Negative visible scalp and face. IMPRESSION: 1. Intermittently motion degraded.  Negative for acute infarct. 2. Abnormal intrinsic T1 signal in the globus pallidus, compatible with cirrhosis. Correlation with serum ammonia levels may be valuable. 3. Superimposed generalized cerebral volume loss. Moderate nonspecific cerebral white matter signal changes, most commonly due to chronic small vessel disease. Electronically Signed   By: Genevie Ann M.D.   On: 08/05/2020 23:29    Discharge Exam:     Vitals:   08/06/20 0800 08/06/20 1106  BP: (!) 70/46 101/70  Pulse: 68 74  Resp: 20 16  Temp: 97.9 F (36.6 C) 98.2 F (36.8 C)  SpO2: 94% 90%         Vitals:   08/05/20 2314 08/06/20 0443 08/06/20 0800 08/06/20 1106  BP: 103/60 120/68 (!) 70/46 101/70  Pulse: 88 (!) 101 68 74  Resp: 16 20 20 16   Temp: 98 F (36.7 C) 98.7 F (37.1 C) 97.9 F (36.6 C) 98.2 F (36.8 C)  TempSrc: Oral Oral Oral Oral  SpO2: 100% 93% 94% 90%  Weight:      Height:         General: Pt is alert, awake, not in acute distress Cardiovascular: RRR, S1/S2 +, no rubs, no gallops Respiratory: CTA bilaterally, no wheezing, no rhonchi Abdominal: Soft, NT, ND, bowel sounds + Extremities: no edema, no cyanosis   Message left yesterday as follows:  Patient called in to inform  Dr Roxan Hockey that upon leaving the hospital she was not given anything for pain and was on Vicodin and asking for a Rx sent to the pharmacy  for this medication.   Palliative care was involved prior to this recent hospitalization, not since discharge.  Since discharge, she notes not much of an appetite, is trying to eat and trying to stay well hydrated.   Notes the main problem currently is the increased discomfort with the back. Very uncomfortable. Has PT and nurse visits at home Can't afford pain management when rec'ed in hospital.  She states she got Vicodin while she was in the hospital, and she needs that presently to help with her pain. Advil not touching the pain Tried many over-the-counter and topical products like Ephraim Hamburger, and just not helping. When I discussed great concerns with the opioids, especially with her concomitant liver disease, she made statements like "dogs get better pain management than humans ", "you are not going to  ask me to beg are you ", "quality of life is more important than quantity of life ". I noted her concerns, and try to discuss ways to help that do not include opioid products which are very concerning in her case, especially noting she has had trouble keeping her blood pressures up as well.  Her prior history noted is as follows:  Hypotension/ A flutter hx - Last saw cardiology 06/18/2020, with the following A/P  Persistent atrial fibrillation (HCC)  Typical atrial flutter (HCC)  Alcoholic cirrhosis of liver with ascites ([place])  Portal hypertension (HCC)  Bleeding esophageal varices, unspecified esophageal varices type ([place])  Anemia,  unspecified type  Atrial flutter with RVR First documented on EKG 01/18/2019 Maintaining normal sinus rhythm Not a good candidate for anticoagulation  Atrial fibrillation with RVR Prior history of fib flutter  Leg edema Likely multifactorial,  Will continue Lasix daily, prescription refilled Was previously only taking Lasix every other day secondary to bladder spasm  Anorexia/severe protein calorie malnutrition Low weight contributing to hypotension  Hypotension Continues to have low blood pressure symptomatic orthostasis on midodrine 5 mg Recommend she increase up to 10 mg 3 times daily  Alcoholic cirrhosis Alcohol cessation recommended History of paracentesis and TIPS procedure,  Followed by GI  Very complicated history, hospital records reviewed for multiple admissions to the emergency room  Total encounter time more than 35 minutes  Greater than 50% was spent in counseling and coordination of care with the patient Can't live in this kind of pain   Neuropathy,chronic pain, and muscle spasticity:Notes is worse in the recent past, feels in feet up the calves. She is prescribed to takegabapentin 400mg TID.She is also taking 4mg  Tizandidine TID for her pain and ongoinglower extremitymuscle spasms(worse when her BLE edema worsens).Has taken more recently. Takes two gabapentin and two tizandidine twice daily, sometimes an extra dose midday, and often cannot walk due to the pain. She did note that she needs these for her pain control, as she simply cannot function without that to help Previously, it was discussed about having pain management providers to help as was entertained in the past,and not wanted with cost an issue. Withpatient's hepatic impairment, noted the need toproceed with great caution,and Dr. Jolly Mango, the gastroenterologist was consulted about increasing the tizanidine dosein the past, and it was recommended not to increase that dose any further.   She has been taking higher than the prescribed doses of the medications in the past few days due to her pain.   DecompensatedCirrhosis with ascitisand hx GI bleed- She had TIPS procedures 08/27/2019 and her abdominal swelling better after.. She has ongoing BLE edema up to her knees. Taking lactulosewith discussion of changing to rifaximin on last GI follow-up although concern noted by patient about cost,as when she filled, she noted it was over thousand dollars, and did not fill.   lactulose use only very rarely now prior to recent hospitalization. Uses lasix daily    Patient Active Problem List   Diagnosis Date Noted  . History of GI bleed 07/29/2020  . Underweight 07/29/2020  . Falls, initial encounter 07/29/2020  . Acute hepatic encephalopathy 07/29/2020  . Traumatic compression fracture of L1 lumbar vertebra ([place]) 07/29/2020  . Elevated troponin 07/29/2020  . Leucocytosis 07/29/2020  . AF (paroxysmal atrial fibrillation) ([place]) 07/29/2020  . Septic shock ([place]) 05/31/2020  . Healthcare-associated pneumonia   .  Alcoholic Cirrhosis of liver without ascites ([place])   . Epigastric hernia 03/06/2020  . Major depressive disorder with single  episode, in partial remission ([place]) 11/28/2019  . Neuropathy 11/28/2019  . Dysthymia 07/27/2019  . Other chronic pain 07/27/2019  . Muscle spasticity 07/27/2019  . C. difficile colitis   . Hypokalemia 07/15/2019  . Bandemia 07/15/2019  . Hypotension   . Goals of care, counseling/discussion   . Palliative care encounter   . Muscle cramping 06/22/2019  . Alcohol abuse 06/22/2019  . Sepsis ([place]) 06/22/2019  . GIB (gastrointestinal bleeding) 06/01/2019  . GI bleeding 06/01/2019  . History of lymphoma 02/26/2019  . Atrial flutter ([place]) 02/07/2019  . Alcoholic cirrhosis of liver with ascites ([place]) 02/07/2019  . Persistent atrial fibrillation ([place]) 02/07/2019  . Anemia 02/07/2019  . Anorexia 02/07/2019  . GI bleed 01/28/2019  . Ascites  01/18/2019  . Cancer [facility])     Past Surgical History:  Procedure Laterality Date  . ABDOMINAL HYSTERECTOMY    . APPENDECTOMY    . COLONOSCOPY N/A 06/03/2019   Procedure: COLONOSCOPY;  Surgeon: Toledo, Benay Pike, MD;  Location: [facility] ENDOSCOPY;  Service: Gastroenterology;  Laterality: N/A;  . EPIGASTRIC HERNIA REPAIR N/A 03/21/2020   Procedure: HERNIA REPAIR EPIGASTRIC ADULT, Open;  Surgeon: [name], MD;  Location: [facility] ORS;  Service: General;  Laterality: N/A;  . ESOPHAGOGASTRODUODENOSCOPY N/A 01/29/2019   Procedure: ESOPHAGOGASTRODUODENOSCOPY (EGD);  Surgeon: [name], MD;  Location: [facility] ENDOSCOPY;  Service: Gastroenterology;  Laterality: N/A;  . ESOPHAGOGASTRODUODENOSCOPY N/A 06/01/2019   Procedure: ESOPHAGOGASTRODUODENOSCOPY (EGD);  Surgeon: Toledo, Benay Pike, MD;  Location: [facility] ENDOSCOPY;  Service: Gastroenterology;  Laterality: N/A;  . IR PARACENTESIS  08/27/2019  . IR RADIOLOGIST EVAL & MGMT  08/08/2019  . IR RADIOLOGIST EVAL & MGMT  11/14/2019  . IR TIPS  08/27/2019  . RADIOLOGY WITH ANESTHESIA N/A 08/27/2019   Procedure: IR WITH ANESTHESIA TIPS PROCEDURE;  Surgeon: [name], MD;  Location: [place];  Service: Radiology;  Laterality: N/A;  . TONSILLECTOMY      Family History  Problem Relation Age of Onset  . Cancer Mother   . Cancer Father     Social History   Tobacco Use  . Smoking status: Current Every Day Smoker    Packs/day: 1.00    Years: 35.00    Pack years: 35.00    Types: Cigarettes  . Smokeless tobacco: Never Used  Substance Use Topics  . Alcohol use: Yes    Comment: 1 sip of wine this am     Current Outpatient Medications:  .  digoxin (LANOXIN) 0.125 MG tablet, Take 1 tablet by mouth once daily, Disp: 90 tablet, Rfl: 0 .  FLUoxetine (PROZAC) 20 MG tablet, Take 1 tablet by mouth once daily, Disp: 90 tablet, Rfl: 0 .  furosemide (LASIX) 40 MG tablet, Take 1 tablet (40 mg total) by mouth daily., Disp: 90 tablet, Rfl: 1 .  gabapentin  (NEURONTIN) 400 MG capsule, Take 1 capsule (400 mg total) by mouth 3 (three) times daily., Disp: 270 capsule, Rfl: 0 .  lactulose (CHRONULAC) 10 GM/15ML solution, Take 45 mLs (30 g total) by mouth 3 (three) times daily., Disp: 236 mL, Rfl: 2 .  melatonin 5 MG TABS, Take 5 mg by mouth at bedtime as needed., Disp: , Rfl:  .  midodrine (PROAMATINE) 10 MG tablet, Take 1 tablet (10 mg total) by mouth 3 (three) times daily with meals., Disp: 270 tablet, Rfl: 2 .  pantoprazole (PROTONIX) 20 MG tablet, Take 1 tablet (20 mg total) by mouth every morning. One hour before breakfast, Disp: 30 tablet, Rfl: 1 .  potassium chloride (KLOR-CON) 10 MEQ tablet, Take 1 tablet (10 mEq total) by mouth daily., Disp: 90 tablet, Rfl: 2 .  tiZANidine (ZANAFLEX) 4 MG capsule, TAKE 1 CAPSULE BY MOUTH THREE TIMES DAILY AS NEEDED FOR MUSCLE SPASM, Disp: 270 capsule, Rfl: 1  No Known Allergies  With staff assistance, above reviewed with the patient today.  ROS: As per HPI, otherwise no specific complaints on a limited and focused system review   Objective  Virtual encounter, vitals not obtained.  There is no height or weight on file to calculate BMI.  Physical Exam   Appears in NAD via conversation,  Breathing: No obvious respiratory distress. Speaking in complete sentences Neurological: Pt is alert, Speech is normal Psychiatric: Patient has a normal mood and affect,  Judgment and thought content normal.    No results found for this or any previous visit (from the past 72 hour(s)).  PHQ2/9: Depression screen [facility] 2/9 08/12/2020 07/03/2020 05/28/2020 03/27/2020 02/26/2020  Decreased Interest 0 0 0 0 0  Down, Depressed, Hopeless 0 0 0 0 0  PHQ - 2 Score 0 0 0 0 0  Altered sleeping 3 - - - 1  Tired, decreased energy 0 - - - 0  Change in appetite 3 - - - 0  Feeling bad or failure about yourself  0 - - - 1  Trouble concentrating 0 - - - 0  Moving slowly or fidgety/restless 0 - - - 0  Suicidal thoughts 0 - - - 0   PHQ-9 Score 6 - - - 2  Difficult doing work/chores Not difficult at all - - - Somewhat difficult  Some recent data might be hidden   PHQ-2/9 Result reviewed  Fall Risk: Fall Risk  08/12/2020 07/03/2020 05/28/2020 03/27/2020 03/06/2020  Falls in the past year? 1 1 0 0 0  Number falls in past yr: 1 1 0 0 0  Injury with Fall? 1 0 0 0 0  Risk for fall due to : - History of fall(s) - History of fall(s);Impaired balance/gait;Medication side effect -  Follow up - Falls evaluation completed Falls evaluation completed - -     Assessment & Plan  1. Alcoholic cirrhosis of liver with ascites (HCC)/hepatic encephalopathy Status post recent hospitalization including hepatic encephalopathy component.  2. Hypotension, unspecified hypotension type Noted concerns keeping her blood pressure up, and she notes she has medicines to help with this presently. Emphasized trying to stay well-hydrated and nutritionally sound to try to help.  3. Neuropathy/chronic pain/muscle spasticity history As per this history as noted above.  4. Paroxysmal A-fib ([place]) 5. Atrial flutter, unspecified type ([place]) Continues to follow with cardiology.  6. Traumatic compression fracture of L1 lumbar vertebra, closed, initial encounter [facility]) Diagnosed on this recent hospital admission, with her pain component from this very problematic presently. As above, tried and discussed measures to help with pain management outside of a narcotic like Vicodin, as have great concerns prescribing this for her as an outpatient noting her liver disease and hypotension concerns. Recommended using lidocaine patches-4% which are over-the-counter now and can be helpful. She notes she has not been able to refill her Neurontin product as she took too much of that earlier in the prescription, and has not been able to use this in the past week plus. Do feel this is a safer medicine been on narcotic to add, and may be helpful with her pain, and will  refill this for her to pick up at the pharmacy today.  Do  think this can help with the pain, and await her response. She was also adamant about having a muscle relaxer in combination as she has been using that to help, and has been doing okay with taking that.  That will also be refilled for her today. Discussed having orthopedic surgery involved, to help assess if there is anything further that can be done surgically that may be helpful, and for further management over time. She also has been taking ibuprofen products at higher doses than recommended, and is aware that is not a good option presently, and it is hoped that adding the Neurontin back into her regimen will be helpful presently. Discussed getting an opinion from orthopedic surgery, and a referral placed requesting help with outpatient management.  7. Encounter for examination following treatment at hospital  She was very unhappy that I was not comfortable ordering an opioid for her to take to help her with her pain management presently, and I did note that I can put a referral through to pain management to help and she did not want to pursue that option as noted above. I did note to her that I will talk with Kristeen Miss, the PA here who she noted was her provider at this practice which I did after this visit.  She was also in agreement with this approach.  If her symptoms are not improving, or more problematic over time, she can follow-up, and do feel will need other providers help in this very challenging case.  I discussed the assessment and treatment plan with the patient. The patient was provided an opportunity to ask questions and all were answered. The patient did not agree with the plan.   The patient was advised to call back or seek an in-person evaluation if the symptoms worsen or if the condition fails to improve as anticipated.  I provided 20 minutes of non-face-to-face time during this encounter that included discussing at length  patient's sx/history, pertinent pmhx, medications, treatment and follow up plan. This time also included the necessary documentation, orders, and chart review.  Towanda Malkin, MD

## 2020-08-12 ENCOUNTER — Telehealth: Payer: Self-pay | Admitting: Internal Medicine

## 2020-08-12 ENCOUNTER — Encounter: Payer: Self-pay | Admitting: Internal Medicine

## 2020-08-12 ENCOUNTER — Telehealth (INDEPENDENT_AMBULATORY_CARE_PROVIDER_SITE_OTHER): Payer: Medicare Other | Admitting: Internal Medicine

## 2020-08-12 DIAGNOSIS — I959 Hypotension, unspecified: Secondary | ICD-10-CM | POA: Diagnosis not present

## 2020-08-12 DIAGNOSIS — I480 Paroxysmal atrial fibrillation: Secondary | ICD-10-CM

## 2020-08-12 DIAGNOSIS — I4892 Unspecified atrial flutter: Secondary | ICD-10-CM | POA: Diagnosis not present

## 2020-08-12 DIAGNOSIS — K7290 Hepatic failure, unspecified without coma: Secondary | ICD-10-CM | POA: Diagnosis not present

## 2020-08-12 DIAGNOSIS — G8929 Other chronic pain: Secondary | ICD-10-CM

## 2020-08-12 DIAGNOSIS — K7682 Hepatic encephalopathy: Secondary | ICD-10-CM

## 2020-08-12 DIAGNOSIS — M62838 Other muscle spasm: Secondary | ICD-10-CM

## 2020-08-12 DIAGNOSIS — S32010A Wedge compression fracture of first lumbar vertebra, initial encounter for closed fracture: Secondary | ICD-10-CM

## 2020-08-12 DIAGNOSIS — K7031 Alcoholic cirrhosis of liver with ascites: Secondary | ICD-10-CM | POA: Diagnosis not present

## 2020-08-12 DIAGNOSIS — G629 Polyneuropathy, unspecified: Secondary | ICD-10-CM | POA: Diagnosis not present

## 2020-08-12 DIAGNOSIS — Z09 Encounter for follow-up examination after completed treatment for conditions other than malignant neoplasm: Secondary | ICD-10-CM | POA: Diagnosis not present

## 2020-08-12 MED ORDER — GABAPENTIN 400 MG PO CAPS: 400.0000 mg | ORAL_CAPSULE | Freq: Three times a day (TID) | ORAL | 2 refills | Status: DC

## 2020-08-12 MED ORDER — TIZANIDINE HCL 4 MG PO CAPS: ORAL_CAPSULE | ORAL | 2 refills | Status: DC

## 2020-08-12 NOTE — Telephone Encounter (Signed)
Home Health Verbal Orders - Caller/Agency: [facility]/ [place] Number: [phone] option #2 Requesting PT start of care  Frequency: start of care for 12.18.21

## 2020-08-13 NOTE — Telephone Encounter (Signed)
Ok for verbal order for PT 

## 2020-08-13 NOTE — Telephone Encounter (Signed)
Verbal has been given. 

## 2020-08-18 ENCOUNTER — Telehealth: Payer: Self-pay | Admitting: Internal Medicine

## 2020-08-18 NOTE — Telephone Encounter (Signed)
Copied from [place] [phone]. Topic: General - Other >> Aug 18, 2020  4:07 PM Rainey Pines A wrote: Cristie Hem Pt called for verbal orders PT 1w3 and 2w5 and then 1w2.Best contact [phone] voicemail secure   Called and left voice message for verbal orders for physical therapy verbal order as requested.

## 2020-08-19 NOTE — Telephone Encounter (Signed)
Ok to give verbal order for this, thanks

## 2020-08-20 ENCOUNTER — Telehealth: Payer: Self-pay

## 2020-08-20 NOTE — Telephone Encounter (Signed)
Spoke with Tiffany at [facility] and gave verbal orders for PT.

## 2020-08-20 NOTE — Telephone Encounter (Signed)
Copied from [place] [id number]. Topic: General - Other >> Aug 20, 2020 12:16 PM Rainey Pines A wrote: Pt called for Verbal orders for home health and PT 1w3 2w5 1w2. Best contact  [phone] option 2

## 2020-08-25 ENCOUNTER — Telehealth: Payer: Self-pay

## 2020-08-25 NOTE — Telephone Encounter (Signed)
11:15AM: Palliative care SW outreached patient to complete telephonic visit.  Palliative care SW outreached patient to complete telephonic visit. Patient provided update on medical condition and/or changes. Patient had recent hospitalization on 11/30 due to dizziness and high ammonia level. When SW attempted to inquire about hospital stay, patient did not want to talk about it. SW had difficulty understanding patient due to slurred speech. Patient shared that she feel fine and is doing okay. Patient denies any recent falls. Patient states that she is always in pain and "just have to live with it".  Patient states that she has HH PT visiting but does not remember name of organization. Patient in need of in person visit. Visit scheduled 09/02/20 @130pm .   Palliative care will continue to monitor and assist with long term care planning as needed.

## 2020-09-02 ENCOUNTER — Other Ambulatory Visit: Payer: Medicare Other

## 2020-09-02 ENCOUNTER — Other Ambulatory Visit: Payer: Self-pay

## 2020-09-02 VITALS — BP 90/60 | HR 57 | Temp 98.6°F

## 2020-09-02 DIAGNOSIS — Z515 Encounter for palliative care: Secondary | ICD-10-CM

## 2020-09-02 NOTE — Progress Notes (Signed)
COMMUNITY PALLIATIVE CARE SW NOTE  PATIENT NAME: Anita Reed DOB: [birth year] MRN: [id number]  PRIMARY CARE PROVIDER: Towanda Malkin, MD  RESPONSIBLE PARTY:  Acct ID - Guarantor Home Phone Work Phone Relationship Acct Type  [id number] RIELY, OETKEN* [phone]  Self P/F     [address], [place], [place] [address]     PLAN OF CARE and INTERVENTIONS:             GOALS OF CARE/ ADVANCE CARE PLANNING:  Patient is a DNR. MOST form discussed. Patient's goal is to remain at home with husband and to be comfortable. 2.         SOCIAL/EMOTIONAL/SPIRITUAL ASSESSMENT/ INTERVENTIONS:  SW and RN Almyra Free met with patient in patients home for follow up visit. Patient lives in a one story home with husband. Patient updated SW and RN medical condition and changes.   Patient was recently hospitalized due to dizziness and high ammonia levels. Patient shared that she had a fall and fractured her vertebrae prior to the hospitalization. Patient shared that she is in constant pain because she is not reeving enough pain medications. Patient shares that she eats well. Patient states that she is not sleeping well and ran out of melatonin. Patient denies taking anything else to help her sleep.   No recent medication changes. RN reviewed medications and took vitals. Patient declines taking lactulose due to the discomfort it givers and she states that due to her having regular bowel movements. Patient shares that she fears her thought process when her ammonia levels are high and does not want to feel that way again. SW educated patient on importance of taking lactulose and keeping her out of the hospital due to her stating that her wishes are to not go to the hospital. Patient has a lot of gas. Patient desires to see a gastro MD to discuss issues with her swallowing and possibly review other options for lactulose as well. Patient will need referral to gastro, and will discuss this PCP office.  SW and RN  discussed hospice services in detail with patient. Patient is receiving PT services through [facility] and would like to discuss hospice services at a later time. SW discussed goals, reviewed care plan, provided emotional support, used active and reflective listening. Palliative care will continue to monitor and assist with long term care planning as needed.  3.         PATIENT/CAREGIVER EDUCATION/ COPING:  Patient A&O. Patient denies any anxiety or depression. Patient takes Prozac and feels as though its helping. Patient is open to supportive counseling. 4.         PERSONAL EMERGENCY PLAN:  Patient will call 9-1-1 for emergencies.  5.         COMMUNITY RESOURCES COORDINATION/ HEALTH CARE NAVIGATION:  Patient manages her care. 6.         FINANCIAL/LEGAL CONCERNS/INTERVENTIONS:  None.      SOCIAL HX:  Social History   Tobacco Use  . Smoking status: Current Every Day Smoker    Packs/day: 1.00    Years: 35.00    Pack years: 35.00    Types: Cigarettes  . Smokeless tobacco: Never Used  Substance Use Topics  . Alcohol use: Yes    Comment: 1 sip of wine this am    CODE STATUS: DNR ADVANCED DIRECTIVES: N MOST FORM COMPLETE: discussed HOSPICE EDUCATION PROVIDED: Y  PPS: Patient is ambulatory without AD. Patient is able to dress, feed and bath self. Patient is able  to meal prep. Patient has RW and shower chair.    Time spent: 1 hr       [place],

## 2020-09-02 NOTE — Progress Notes (Signed)
PATIENT NAME: Anita Reed DOB: [birth year] MRN: [id number]  PRIMARY CARE PROVIDER: Towanda Malkin, MD  RESPONSIBLE PARTY:  Acct ID - Guarantor Home Phone Work Phone Relationship Acct Type  [id number] Anita Reed* [phone]  Self P/F     [address], [place], [place] [address]    PLAN OF CARE and INTERVENTIONS:               1.  GOALS OF CARE/ ADVANCE CARE PLANNING:  Remain home and as independent as possible.               2.  PATIENT/CAREGIVER EDUCATION:  Lactulose/amonia levels, Cirrhosis of the Liver, Hospice Care               4. PERSONAL EMERGENCY PLAN:  Activate 911 for emergencies.               5.  DISEASE STATUS:  Joint visit with Anita Reed, SW.  Visit completed with patient.  Patient provided an update on her recent hospitalization. She noted severe hallucinations and confusion.  Patient states she believes the hallucinations were real and her spouse was able to confirm this was not the case.    Patient is openly talking about her poor condition and dying.  She desires to remain home to die.  Patient wants hospice when the time is right.  Further discussion completed on hospice criteria and philosophy.  Patient does not feel like she is ready for hospice.  [facility] ([facility] just started yesterday and patient wants to continue with this service for now.     Patient discussed her dysphagia.  She is not following her pureed diet.  Notes that she is having a swallowing problem.  She is eating a regular diet but taking in smaller bites.  Patient notes some coughing on occasion.    Patient has not sustained any falls since her hospital discharge.  She is using a rolling walker during the night but not during the daytime.     Shortness of breath observed with talking.   Patient reports shortness of breath when laying down in the bed.      Patient is having some insomnia.  She is currently out of her Melatonin.  Patient will ask her spouse to pick up more.      Reviewed  medications with patient.   She is not taking lactulose.  I have discussed the purpose of this medication, ammonia level and cirrhosis of the liver.  Patient states she will not take the lactulose.  She is looking at her quality of life and states being in the bathroom is no way to live.    Patient encouraged to follow up with PCP and GI.         HISTORY OF PRESENT ILLNESS:  60 year old female with Cirrhosis of the Liver.  Patient is being followed by Palliative Care monthly and PRN.  CODE STATUS: DNR ADVANCED DIRECTIVES: No MOST FORM: No PPS: 50%   PHYSICAL EXAM:   VITALS: Today's Vitals   09/02/20 1339  BP: 90/60  Pulse: (!) 57  Temp: 98.6 F (37 C)  SpO2: 97%  PainSc: 8   PainLoc: Back    LUNGS: CTA bilaterally CARDIAC: HRR EXTREMITIES: 2+ pitting edema SKIN: warm and dry to touch.  NEURO:  A & O x 3  TIME 130 pm-236 pm       Lorenza Burton, RN

## 2020-09-21 NOTE — Progress Notes (Deleted)
Date:  09/21/2020   ID:  Anita Reed, DOB Dec 25, 1960, MRN [id number]  Patient Location:  [place] [address]   Provider location:   [facility], [place] office  PCP:  Towanda Malkin, MD  Cardiologist:  [facility]   No chief complaint on file.   History of Present Illness:    Anita Reed is a 60 y.o. female  past medical history of  alcohol abuse, cirrhosis, portal hypertension, varices,  medication noncompliance, hepatitis C ,  atrial flutter on EKG May 2020   ascites severe portal hypertensive gastropathy, GI bleed She presents for f/u of her atrial flutter, fibrillation  In the Er 3 weeks ago with hypotension, requiring antibiotics, pressors Admitted under ICU then transferred to hospitalist service 2 days later She required pressors/Levophed, IV fluids, ABX Told that she had septic shock, pneumonia Blood cx negative  In follow-up today reports that she has some leg swelling but otherwise feeling better Takes lasix 40 daily  Denies any recent tachycardia palpitations concerning for arrhythmia  Smoker Continues to have chronic shortness of breath  Prior hospitalization records reviewed from December 2020 for ascites, alcohol cirrhosis, portal venous hypertension Had therapeutic paracentesis, TIPS performed, ammonia level 68 Was hypotensive at the time systolic pressure 84  EKG personally reviewed by myself on todays visit Shows normal sinus rhythm rate 58 bpm low voltage nonspecific T wave abnormality  The past medical history reviewed Hx of  upper GI bleed atrial fibrillation atrial flutter Previous EKGs reviewed showing atrial flutter rate 120s  She received 2 units packed red blood cells in the hospital for anemia In the setting of GI bleed, hemoglobin dropping to 8.6  EKG shows atrial flutter Jan 18, 2019, EKG on recent hospital admission showing atrial fibrillation, this broke back to  flutter  Long history of alcohol, occasional beer or wine   Prior CV studies:   The following studies were reviewed today:    Past Medical History:  Diagnosis Date  . Anemia   . Atrial flutter ([place])   . Cancer [facility])    lymphoma 11 yrs. ago  . Cirrhosis ([place])   . Dysrhythmia   . Esophageal varices ([place])   . GERD (gastroesophageal reflux disease)   . Hepatitis    hep C-resolved  . History of hiatal hernia   . Hypertension   . Pneumonia   . Portal hypertension ([facility])    Past Surgical History:  Procedure Laterality Date  . ABDOMINAL HYSTERECTOMY    . APPENDECTOMY    . COLONOSCOPY N/A 06/03/2019   Procedure: COLONOSCOPY;  Surgeon: Toledo, Benay Pike, MD;  Location: [facility] ENDOSCOPY;  Service: Gastroenterology;  Laterality: N/A;  . EPIGASTRIC HERNIA REPAIR N/A 03/21/2020   Procedure: HERNIA REPAIR EPIGASTRIC ADULT, Open;  Surgeon: [name], MD;  Location: [facility] ORS;  Service: General;  Laterality: N/A;  . ESOPHAGOGASTRODUODENOSCOPY N/A 01/29/2019   Procedure: ESOPHAGOGASTRODUODENOSCOPY (EGD);  Surgeon: [name], MD;  Location: [facility] ENDOSCOPY;  Service: Gastroenterology;  Laterality: N/A;  . ESOPHAGOGASTRODUODENOSCOPY N/A 06/01/2019   Procedure: ESOPHAGOGASTRODUODENOSCOPY (EGD);  Surgeon: Toledo, Benay Pike, MD;  Location: [facility] ENDOSCOPY;  Service: Gastroenterology;  Laterality: N/A;  . IR PARACENTESIS  08/27/2019  . IR RADIOLOGIST EVAL & MGMT  08/08/2019  . IR RADIOLOGIST EVAL & MGMT  11/14/2019  . IR TIPS  08/27/2019  . RADIOLOGY WITH ANESTHESIA N/A 08/27/2019   Procedure: IR WITH ANESTHESIA TIPS PROCEDURE;  Surgeon: [name], MD;  Location:  [facility] OR;  Service: Radiology;  Laterality: N/A;  . TONSILLECTOMY       Allergies:   Patient has no known allergies.   Social History   Tobacco Use  . Smoking status: Current Every Day Smoker    Packs/day: 1.00    Years: 35.00    Pack years: 35.00    Types: Cigarettes  . Smokeless tobacco: Never Used  Vaping Use  .  Vaping Use: Never used  Substance Use Topics  . Alcohol use: Yes    Comment: 1 sip of wine this am  . Drug use: Never     Current Outpatient Medications on File Prior to Visit  Medication Sig Dispense Refill  . digoxin (LANOXIN) 0.125 MG tablet Take 1 tablet by mouth once daily 90 tablet 0  . FLUoxetine (PROZAC) 20 MG tablet Take 1 tablet by mouth once daily 90 tablet 0  . furosemide (LASIX) 40 MG tablet Take 1 tablet (40 mg total) by mouth daily. 90 tablet 1  . gabapentin (NEURONTIN) 400 MG capsule Take 1 capsule (400 mg total) by mouth 3 (three) times daily. 90 capsule 2  . lactulose (CHRONULAC) 10 GM/15ML solution Take 45 mLs (30 g total) by mouth 3 (three) times daily. 236 mL 2  . melatonin 5 MG TABS Take 5 mg by mouth at bedtime as needed.    . midodrine (PROAMATINE) 10 MG tablet Take 1 tablet (10 mg total) by mouth 3 (three) times daily with meals. 270 tablet 2  . pantoprazole (PROTONIX) 20 MG tablet Take 1 tablet (20 mg total) by mouth every morning. One hour before breakfast 30 tablet 1  . potassium chloride (KLOR-CON) 10 MEQ tablet Take 1 tablet (10 mEq total) by mouth daily. 90 tablet 2  . tiZANidine (ZANAFLEX) 4 MG capsule TAKE 1 CAPSULE BY MOUTH THREE TIMES DAILY AS NEEDED FOR MUSCLE SPASM 90 capsule 2   No current facility-administered medications on file prior to visit.     Family Hx: The patient's family history includes Cancer in her father and mother.  ROS:   Please see the history of present illness.    Review of Systems  Constitutional: Negative.   HENT: Negative.   Respiratory: Positive for shortness of breath.   Cardiovascular: Positive for leg swelling.  Gastrointestinal: Negative.   Musculoskeletal: Negative.   Neurological: Negative.   Psychiatric/Behavioral: Negative.   All other systems reviewed and are negative.     Labs/Other Tests and Data Reviewed:    Recent Labs: 06/02/2020: TSH 1.624 07/30/2020: ALT 31; Magnesium 1.8 08/06/2020: BUN 7;  Creatinine, Ser 0.38; Hemoglobin 14.3; Platelets 117; Potassium 3.5; Sodium 145   Recent Lipid Panel No results found for: CHOL, TRIG, HDL, CHOLHDL, LDLCALC, LDLDIRECT  Wt Readings from Last 3 Encounters:  07/30/20 134 lb 11.2 oz (61.1 kg)  07/03/20 150 lb 8 oz (68.3 kg)  06/18/20 156 lb 6.4 oz (70.9 kg)     Exam:    Vital Signs: Vital signs may also be detailed in the HPI There were no vitals taken for this visit.  Wt Readings from Last 3 Encounters:  07/30/20 134 lb 11.2 oz (61.1 kg)  07/03/20 150 lb 8 oz (68.3 kg)  06/18/20 156 lb 6.4 oz (70.9 kg)   Temp Readings from Last 3 Encounters:  09/02/20 98.6 F (37 C)  08/06/20 98.2 F (36.8 C) (Oral)  07/03/20 98.1 F (36.7 C) (Oral)   BP Readings from Last 3 Encounters:  09/02/20 90/60  08/06/20 101/70  07/03/20  92/70   Pulse Readings from Last 3 Encounters:  09/02/20 (!) 57  08/06/20 74  07/03/20 64      ASSESSMENT & PLAN:    No diagnosis found.  Atrial flutter with RVR First documented on EKG 01/18/2019 Maintaining normal sinus rhythm Not a good candidate for anticoagulation  Atrial fibrillation with RVR Prior history of fib flutter  Leg edema Likely multifactorial,  Will continue Lasix daily, prescription refilled Was previously only taking Lasix every other day secondary to bladder spasm  Anorexia/severe protein calorie malnutrition Low weight contributing to hypotension  Hypotension Continues to have low blood pressure symptomatic orthostasis on midodrine 5 mg Recommend she increase up to 10 mg 3 times daily  Alcoholic cirrhosis Alcohol cessation recommended History of paracentesis and TIPS procedure,  Followed by GI  Very complicated history, hospital records reviewed for multiple admissions to the emergency room  Total encounter time more than 35 minutes  Greater than 50% was spent in counseling and coordination of care with the patient    Signed, Ida Rogue, MD  09/21/2020 5:53  PM    [facility] [id number], [place], [place] [id number]

## 2020-09-22 ENCOUNTER — Ambulatory Visit: Payer: Medicare Other | Admitting: Cardiovascular Disease

## 2020-09-22 DIAGNOSIS — I8501 Esophageal varices with bleeding: Secondary | ICD-10-CM

## 2020-09-22 DIAGNOSIS — D649 Anemia, unspecified: Secondary | ICD-10-CM

## 2020-09-22 DIAGNOSIS — I483 Typical atrial flutter: Secondary | ICD-10-CM

## 2020-09-22 DIAGNOSIS — K766 Portal hypertension: Secondary | ICD-10-CM

## 2020-09-22 DIAGNOSIS — I959 Hypotension, unspecified: Secondary | ICD-10-CM

## 2020-09-22 DIAGNOSIS — K7031 Alcoholic cirrhosis of liver with ascites: Secondary | ICD-10-CM

## 2020-09-22 DIAGNOSIS — I4819 Other persistent atrial fibrillation: Secondary | ICD-10-CM

## 2020-10-02 ENCOUNTER — Other Ambulatory Visit: Payer: Self-pay

## 2020-10-02 ENCOUNTER — Ambulatory Visit (INDEPENDENT_AMBULATORY_CARE_PROVIDER_SITE_OTHER): Payer: Medicare Other | Admitting: Family Medicine

## 2020-10-02 ENCOUNTER — Encounter: Payer: Self-pay | Admitting: Family Medicine

## 2020-10-02 VITALS — BP 100/70 | HR 88 | Temp 97.7°F | Ht 69.0 in | Wt 140.2 lb

## 2020-10-02 DIAGNOSIS — S32010A Wedge compression fracture of first lumbar vertebra, initial encounter for closed fracture: Secondary | ICD-10-CM

## 2020-10-02 DIAGNOSIS — R059 Cough, unspecified: Secondary | ICD-10-CM

## 2020-10-02 DIAGNOSIS — W19XXXA Unspecified fall, initial encounter: Secondary | ICD-10-CM

## 2020-10-02 DIAGNOSIS — Z09 Encounter for follow-up examination after completed treatment for conditions other than malignant neoplasm: Secondary | ICD-10-CM | POA: Diagnosis not present

## 2020-10-02 DIAGNOSIS — Z66 Do not resuscitate: Secondary | ICD-10-CM

## 2020-10-02 DIAGNOSIS — G8929 Other chronic pain: Secondary | ICD-10-CM

## 2020-10-02 DIAGNOSIS — K7030 Alcoholic cirrhosis of liver without ascites: Secondary | ICD-10-CM

## 2020-10-02 DIAGNOSIS — M62838 Other muscle spasm: Secondary | ICD-10-CM

## 2020-10-02 DIAGNOSIS — K7031 Alcoholic cirrhosis of liver with ascites: Secondary | ICD-10-CM | POA: Diagnosis not present

## 2020-10-02 DIAGNOSIS — K140 Glossitis: Secondary | ICD-10-CM

## 2020-10-02 DIAGNOSIS — S32020A Wedge compression fracture of second lumbar vertebra, initial encounter for closed fracture: Secondary | ICD-10-CM

## 2020-10-02 DIAGNOSIS — R300 Dysuria: Secondary | ICD-10-CM

## 2020-10-02 DIAGNOSIS — I959 Hypotension, unspecified: Secondary | ICD-10-CM

## 2020-10-02 DIAGNOSIS — R531 Weakness: Secondary | ICD-10-CM

## 2020-10-02 DIAGNOSIS — G629 Polyneuropathy, unspecified: Secondary | ICD-10-CM

## 2020-10-02 DIAGNOSIS — B370 Candidal stomatitis: Secondary | ICD-10-CM

## 2020-10-02 DIAGNOSIS — R55 Syncope and collapse: Secondary | ICD-10-CM

## 2020-10-02 DIAGNOSIS — R079 Chest pain, unspecified: Secondary | ICD-10-CM

## 2020-10-02 DIAGNOSIS — Z7189 Other specified counseling: Secondary | ICD-10-CM

## 2020-10-02 MED ORDER — TIZANIDINE HCL 4 MG PO CAPS: 4.0000 mg | ORAL_CAPSULE | Freq: Three times a day (TID) | ORAL | 2 refills | Status: AC | PRN

## 2020-10-02 MED ORDER — GABAPENTIN 400 MG PO CAPS: ORAL_CAPSULE | ORAL | 1 refills | Status: AC

## 2020-10-02 NOTE — Progress Notes (Signed)
Name: Anita Reed   MRN: [id number]    DOB: Jan 04, 1961   Date:10/02/2020       Progress Note  No chief complaint on file.    Subjective:   Anita Reed is a 60 y.o. female, presents to clinic for   Here for hospital follow up  Pt had admission in November she subsequently did a video follow-up with Dr. Roxan Hockey Patient presents today for continued chronic care management and would like better pain management She reports severe acute on chronic low back pain that was worse after her hospitalization due to fall and new T2 compression fracture She reports that she has met with palliative care and is on hospice and does want to be comfortable and values quality of life over quantity.  She states that they are not managing her pain at all and her prior PCP would not call in any pain medication but did give her muscle relaxers and gabapentin  She has been having difficulty getting her medications because insurance will not pay for them or she is getting them too early -her pain is so severe that she has taken slightly higher dose of gabapentin midday that is when her pain is the worst Her prescription is for 400 mg gabapentin 3 times daily sometimes taking 2 /800 mg at lunchtime  She does ask for Zanaflex to be refilled in the 4 mg dosing that it currently is in this helps with her severe muscle pain and spasms  She also complains of dysuria and think she has a UTI  She reports after getting out of the hospital and going home she developed URI symptoms head congestion led to some sore throat and later went to her chest and she reports having cough and chest pain around her lower ribs and across her upper chest she states that when she was in the hospital they took a chest x-ray and she had pneumonia then and nobody told her no and treated her for it  We reviewed her x-rays together today she did have some fluid in the left lower lobe -which is suspected to be a left pleural effusion that  was small and improving from imaging 1 month prior  Patient has cirrhosis status post TIPS procedure she remembers being very confused and remembering things that did not happen to her to states that her levels are very high I believe she is referring to her ammonia and they did diagnose her with hepatic encephalopathy she also was hypotensive and had a syncopal episode  Not on lactulose - can't tolerate - severe SE, GI cramping, diarrhea - hasn't seen Dr. Marius Ditch She denies any swelling of her abdomen.  Her skin tone does look darker today her husband does tell her that she looks jaundice but she states that her coloring is only different because she is wearing make-up She reports not taking lactulose eating mostly fruit all day long and having healthy normal bowel movements Does have a history of acid reflux and indigestion -with her worsening muscle skeletal and back pain she states she has been "gobbeling down NSAIDs" denies any blood in stool  She does report recurrent dizziness episodes increased falls at home she was found to have a traumatic L1 vertebral fracture had a L2 compression deformity that appeared subacute at the time of her hospitalization in November, she has been told to use a walker and has been told that if she passes out or falls that she may never walk again she has  home health and PT orders and has consulted with palliative care but these orders and follow-up have been going to her PCP Dr. Roxan Hockey and he only recently left the clinic  I can only see a early January consult documentation in the chart with palliative care -social work encounter for goals of care -  Copy documentation from 09/02/2020 consultation:  PLAN OF CARE and INTERVENTIONS:               1.  GOALS OF CARE/ ADVANCE CARE PLANNING:  Remain home and as independent as possible.               2.  PATIENT/CAREGIVER EDUCATION:  Lactulose/amonia levels, Cirrhosis of the Liver, Hospice Care               4.  PERSONAL EMERGENCY PLAN:  Activate 911 for emergencies.               5.  DISEASE STATUS:  Joint visit with [place], SW.  Visit completed with patient.  Patient provided an update on her recent hospitalization. She noted severe hallucinations and confusion.  Patient states she believes the hallucinations were real and her spouse was able to confirm this was not the case.    Patient is openly talking about her poor condition and dying.  She desires to remain home to die.  Patient wants hospice when the time is right.  Further discussion completed on hospice criteria and philosophy.  Patient does not feel like she is ready for hospice.  PT ([facility]) just started yesterday and patient wants to continue with this service for now.     Patient discussed her dysphagia.  She is not following her pureed diet.  Notes that she is having a swallowing problem.  She is eating a regular diet but taking in smaller bites.  Patient notes some coughing on occasion.    Patient has not sustained any falls since her hospital discharge.  She is using a rolling walker during the night but not during the daytime.     Shortness of breath observed with talking.   Patient reports shortness of breath when laying down in the bed.      Patient is having some insomnia.  She is currently out of her Melatonin.  Patient will ask her spouse to pick up more.      Reviewed medications with patient.   She is not taking lactulose.  I have discussed the purpose of this medication, ammonia level and cirrhosis of the liver.  Patient states she will not take the lactulose.  She is looking at her quality of life and states being in the bathroom is no way to live.    Patient encouraged to follow up with PCP and GI.  She does continue to state that she does not want to be intubated or resuscitated, she reports that when she passed out in the ER and she was "near death" that they gave her adrenaline and she does not know why they  resuscitated her then Patient would like to have improved pain management and would like to die at home she does confirm this today.  Patient does endorse some continued thrush and redness in her throat -she notes black patches on her tongue and the back of her throat in the past  She does have a history of alcoholic cirrhosis proximal A. fib not on anticoagulation due to history of variceal bleeds in 2020- she has chronic hypotension on midodrine -  managed by Dr. Margretta Sidle has not followed up with her cardiologist he also prescribes her Lasix and digoxin  She has not followed up with Dr. Marolyn Haller and refuses a referral to a different GI specialist today but also does not want to go to her current managing GI specialist   She reports that she has stopped taking Prozac    Current Outpatient Medications:  .  digoxin (LANOXIN) 0.125 MG tablet, Take 1 tablet by mouth once daily, Disp: 90 tablet, Rfl: 0 .  FLUoxetine (PROZAC) 20 MG tablet, Take 1 tablet by mouth once daily, Disp: 90 tablet, Rfl: 0 .  furosemide (LASIX) 40 MG tablet, Take 1 tablet (40 mg total) by mouth daily., Disp: 90 tablet, Rfl: 1 .  gabapentin (NEURONTIN) 400 MG capsule, Take 1 capsule (400 mg total) by mouth 3 (three) times daily., Disp: 90 capsule, Rfl: 2 .  lactulose (CHRONULAC) 10 GM/15ML solution, Take 45 mLs (30 g total) by mouth 3 (three) times daily., Disp: 236 mL, Rfl: 2 .  melatonin 5 MG TABS, Take 5 mg by mouth at bedtime as needed., Disp: , Rfl:  .  midodrine (PROAMATINE) 10 MG tablet, Take 1 tablet (10 mg total) by mouth 3 (three) times daily with meals., Disp: 270 tablet, Rfl: 2 .  pantoprazole (PROTONIX) 20 MG tablet, Take 1 tablet (20 mg total) by mouth every morning. One hour before breakfast, Disp: 30 tablet, Rfl: 1 .  potassium chloride (KLOR-CON) 10 MEQ tablet, Take 1 tablet (10 mEq total) by mouth daily., Disp: 90 tablet, Rfl: 2 .  tiZANidine (ZANAFLEX) 4 MG capsule, TAKE 1 CAPSULE BY MOUTH THREE TIMES  DAILY AS NEEDED FOR MUSCLE SPASM, Disp: 90 capsule, Rfl: 2  Patient Active Problem List   Diagnosis Date Noted  . History of GI bleed 07/29/2020  . Underweight 07/29/2020  . Falls, initial encounter 07/29/2020  . Acute hepatic encephalopathy 07/29/2020  . Traumatic compression fracture of L1 lumbar vertebra ([place]) 07/29/2020  . Elevated troponin 07/29/2020  . Leucocytosis 07/29/2020  . AF (paroxysmal atrial fibrillation) ([place]) 07/29/2020  . Septic shock ([place]) 05/31/2020  . Healthcare-associated pneumonia   .  Alcoholic Cirrhosis of liver without ascites ([place])   . Epigastric hernia 03/06/2020  . Major depressive disorder with single episode, in partial remission ([place]) 11/28/2019  . Neuropathy 11/28/2019  . Dysthymia 07/27/2019  . Other chronic pain 07/27/2019  . Muscle spasticity 07/27/2019  . C. difficile colitis   . Hypokalemia 07/15/2019  . Bandemia 07/15/2019  . Hypotension   . Goals of care, counseling/discussion   . Palliative care encounter   . Muscle cramping 06/22/2019  . Alcohol abuse 06/22/2019  . Sepsis ([place]) 06/22/2019  . GIB (gastrointestinal bleeding) 06/01/2019  . GI bleeding 06/01/2019  . History of lymphoma 02/26/2019  . Atrial flutter ([place]) 02/07/2019  . Alcoholic cirrhosis of liver with ascites ([place]) 02/07/2019  . Persistent atrial fibrillation ([place]) 02/07/2019  . Anemia 02/07/2019  . Anorexia 02/07/2019  . GI bleed 01/28/2019  . Ascites 01/18/2019  . Cancer [facility])     Past Surgical History:  Procedure Laterality Date  . ABDOMINAL HYSTERECTOMY    . APPENDECTOMY    . COLONOSCOPY N/A 06/03/2019   Procedure: COLONOSCOPY;  Surgeon: Toledo, Benay Pike, MD;  Location: [facility] ENDOSCOPY;  Service: Gastroenterology;  Laterality: N/A;  . EPIGASTRIC HERNIA REPAIR N/A 03/21/2020   Procedure: HERNIA REPAIR EPIGASTRIC ADULT, Open;  Surgeon: [name], MD;  Location: [facility] ORS;  Service: General;  Laterality: N/A;  . ESOPHAGOGASTRODUODENOSCOPY  N/A 01/29/2019    Procedure: ESOPHAGOGASTRODUODENOSCOPY (EGD);  Surgeon: [name], MD;  Location: [facility] ENDOSCOPY;  Service: Gastroenterology;  Laterality: N/A;  . ESOPHAGOGASTRODUODENOSCOPY N/A 06/01/2019   Procedure: ESOPHAGOGASTRODUODENOSCOPY (EGD);  Surgeon: Toledo, Benay Pike, MD;  Location: [facility] ENDOSCOPY;  Service: Gastroenterology;  Laterality: N/A;  . IR PARACENTESIS  08/27/2019  . IR RADIOLOGIST EVAL & MGMT  08/08/2019  . IR RADIOLOGIST EVAL & MGMT  11/14/2019  . IR TIPS  08/27/2019  . RADIOLOGY WITH ANESTHESIA N/A 08/27/2019   Procedure: IR WITH ANESTHESIA TIPS PROCEDURE;  Surgeon: [name], MD;  Location: [place];  Service: Radiology;  Laterality: N/A;  . TONSILLECTOMY      Family History  Problem Relation Age of Onset  . Cancer Mother   . Cancer Father     Social History   Tobacco Use  . Smoking status: Current Every Day Smoker    Packs/day: 1.00    Years: 35.00    Pack years: 35.00    Types: Cigarettes  . Smokeless tobacco: Never Used  Vaping Use  . Vaping Use: Never used  Substance Use Topics  . Alcohol use: Yes    Comment: 1 sip of wine this am  . Drug use: Never     No Known Allergies  Health Maintenance  Topic Date Due  . COVID-19 Vaccine (1) Never done  . INFLUENZA VACCINE  Never done  . MAMMOGRAM  11/27/2020 (Originally 08/05/2011)  . TETANUS/TDAP  11/27/2020 (Originally 08/04/1980)  . PAP SMEAR-Modifier  04/01/2022  . COLONOSCOPY (Pts 45-47yrs Insurance coverage will need to be confirmed)  06/02/2029  . Hepatitis C Screening  Completed  . HIV Screening  Completed    Chart Review Today: I personally reviewed active problem list, medication list, allergies, family history, social history, health maintenance, notes from last encounter, lab results, imaging with the patient/caregiver today. More than 20 minutes spent reviewing palliative care documentation, past PCP documents, scanned documents regarding home health orders and palliative consults also her  past hospitalization and labs  Review of Systems  Constitutional: Positive for activity change and appetite change. Negative for chills, diaphoresis and fatigue.  HENT: Positive for sore throat.   Eyes: Negative.   Respiratory: Positive for cough and shortness of breath.   Cardiovascular: Positive for chest pain. Negative for palpitations and leg swelling.  Gastrointestinal: Positive for nausea. Negative for abdominal distention, constipation, diarrhea and vomiting.  Endocrine: Negative.   Genitourinary: Positive for dysuria, frequency and urgency. Negative for hematuria.  Musculoskeletal: Positive for back pain and gait problem.  Skin: Positive for color change. Negative for pallor, rash and wound.  Allergic/Immunologic: Negative.   Neurological: Positive for dizziness, tremors, syncope, weakness and light-headedness. Negative for seizures, facial asymmetry, speech difficulty, numbness and headaches.  Hematological: Negative.   Psychiatric/Behavioral: Negative.   All other systems reviewed and are negative.    Objective:   Vitals:   10/02/20 1451 10/02/20 1456  BP: 90/60 100/70  Pulse: 88   Temp: 97.7 F (36.5 C)   TempSrc: Oral   SpO2: 96%   Weight: 140 lb 3.2 oz (63.6 kg)   Height: $Remove'5\' 9"'[id number]$  (1.753 m)     Body mass index is 20.7 kg/m.  Physical Exam Vitals and nursing note reviewed.  Constitutional:      Appearance: She is well-developed and well-groomed. She is ill-appearing. She is not diaphoretic.  HENT:     Head: Normocephalic and atraumatic. No right periorbital erythema or left periorbital erythema.  Nose: Nose normal.     Mouth/Throat:     Mouth: Mucous membranes are dry.     Pharynx: Uvula midline. Posterior oropharyngeal erythema present.     Tonsils: No tonsillar exudate. 0 on the right. 0 on the left.  Eyes:     General: Lids are normal.  Cardiovascular:     Pulses: Normal pulses.     Heart sounds: Normal heart sounds.  Pulmonary:     Effort: No  respiratory distress.     Breath sounds: Normal breath sounds.  Abdominal:     General: Bowel sounds are normal. There is no distension.     Palpations: Abdomen is soft.     Tenderness: There is no abdominal tenderness.  Musculoskeletal:     Right lower leg: Edema present.     Left lower leg: Edema present.     Comments: Generalized lower thoracic to lumbar spinal tenderness and paraspinal tenderness with decreased range of motion hunched forward and difficulty with position changes and with walking  Skin:    Comments: Skin color appears darker than her normal -difficult to tell with face make-up, possible faint scleral icterus  Neurological:     Mental Status: She is alert.     Cranial Nerves: No cranial nerve deficit.     Gait: Gait abnormal.     Comments: Generalized weakness no tremor or seizure activity Somewhat staggering slow gait and antalgic gait  Psychiatric:        Attention and Perception: Attention normal.        Behavior: Behavior is cooperative.        Thought Content: Thought content does not include homicidal or suicidal ideation. Thought content does not include homicidal or suicidal plan.     Comments: Speech somewhat slowed but not slurred -patient appears tired but is alert and not lethargic Mood is somewhat blunted and she is slightly angry but does not appear anxious or depressed         Assessment & Plan:     ICD-10-CM   1. Alcoholic cirrhosis of liver with ascites (HCC)  K35.46 COMPLETE METABOLIC PANEL WITH GFR    CBC with Differential/Platelet   Worsening chronic condition noncompliance to lactulose likely worsening jaundice and recent hepatic encephalopathy  2. Hypotension, unspecified hypotension type  F68.1 COMPLETE METABOLIC PANEL WITH GFR    CBC with Differential/Platelet   Blood pressure soft today, she needs to follow-up with cardiology  3. Encounter for examination following treatment at hospital  E75 COMPLETE METABOLIC PANEL WITH GFR    CBC  with [facility] encounter has previously been done I have reviewed the encounter and so did her PCP  4. Other chronic pain  G89.29 Ambulatory referral to Spine Surgery    gabapentin (NEURONTIN) 400 MG capsule    tiZANidine (ZANAFLEX) 4 MG capsule   She would like narcotic pain management states that hospice is not doing this for her -we will need to contact the team and see if they are willing to manage  5. Muscle spasticity  M62.838 Ambulatory referral to Spine Surgery    gabapentin (NEURONTIN) 400 MG capsule    tiZANidine (ZANAFLEX) 4 MG capsule   Increased dose of Zanaflex after reviewing medications patient is aware of risks of med including sedation lethargy confusion  6. Weakness  R53.1 Ambulatory referral to Spine Surgery   Generalized weakness worsening health and chronic conditions -working with home PT and palliative/hospice  7. Near syncope  R55  Recurrent on midodrine per Dr. Rockey Situ patient needs to follow-up with cardiology  8. Traumatic compression fracture of L1 lumbar vertebra, closed, initial encounter [facility])  S32.010A Ambulatory referral to Spine Surgery   Patient wishes for spine specialist referral stating they did nothing for her with her hospitalization and traumatic fracture  9. Thrush  B37.0   10. DNR (do not resuscitate)  Z66    Patient expresses wishes again to not be intubated or resuscitated -we discussed options such as fluids or antibiotics   11. Dysuria  R30.0 POCT urinalysis dipstick    Urine Culture   Dysuria urgency and frequency urine dip was unable to be completed due to dark color of urine will be sent for urine culture  12. Cough  R05.9 DG Chest 2 View   See below  13. Chest pain, unspecified type  R07.9 DG Chest 2 View   She notes chest congestion chest pain and concerned that she has pneumonia poor inspiratory effort and some congestion on exam chest x-ray ordered  14. Compression fracture of L2 vertebra, initial encounter [facility])   S32.020A Ambulatory referral to Spine Surgery   fall, hospital admission in Nov to Dec, worsening acute on chronic back pain, pt doing palliative care/hospice HH PT, wishes to have eval and be more comfortabl  15. Neuropathy  G62.9 Ambulatory referral to Spine Surgery    gabapentin (NEURONTIN) 400 MG capsule   Increased dose of gabapentin reviewed hepatic and renal clearance and dosing refill sent in  16. Goals of care, counseling/discussion  Z71.89    Patient values comfort and quality of life over quantity does not want any life support or life prolonging measures -we will need to see hospice palliative care  17. Falls, initial encounter  W19.XXXA Ambulatory referral to Spine Surgery   Of falls with fractures increased pain using walker at home and has physical therapy previously ordered  18. Glossitis  K14.0    If she is given any antibiotics will likely need to give her another long course of Diflucan which helps her glossitis/pharyngitis/thrush  19. Alcoholic cirrhosis, unspecified whether ascites present ([place])  K70.30    Patient is not following up with GI and not taking lactulose appears somewhat darker pigmented possibly jaundiced today   Pending lab results and x-ray will see if she requires any antibiotic coverage I do need to catch up on additional documents from her consult with palliative care and hospice and see if they have addressed or documented whether or not they will manage her chronic pain -I explained that palliative care may not always do this but hospice care generally does as part of their scope of care at home in the dying process -because a lot of her health and referrals and PT have been managed by Dr. Roxan Hockey I am not aware of what has most recently happened and not everything can be reviewed in the chart today  Patient has poor prognosis, multiple chronic conditions that are worsening, uncontrolled, patient does not seem to want to follow-up with her specialist Dr.  Rockey Situ and Dr. Marius Ditch  She appears tired and generally weak and chronically ill today but not toxic appearing She had multiple acute complaints attempted to address this today to rule out pneumonia and to evaluate her for UTI Addressed goals of care/DNR She wants better pain management I did increase her gabapentin and her tizanidine dosing -I do want to help her with her pain management but I want to ensure that palliative care or  hospice is not doing this I explained to her that chronic pain management laws prevent multiple providers from prescribing medications and that they will need to be additional documentation and monitoring for this it would be more convenient for her to have hospice manage this so she can be more comfortable in her home and they can adjust dose as her prognosis changes or her condition worsens  Level 5 billing for time spent today and for complexity Spent more than 40 minutes with the patient, spent more than 15 minutes on chart review and an additional 15 minutes on documentation today  Return for 1 month f/up - virtual if pt is not feeling well - f/up on palliative care, pain, meds.   Delsa Grana, PA-C 10/02/20 12:27 PM

## 2020-10-03 ENCOUNTER — Telehealth: Payer: Self-pay

## 2020-10-03 LAB — COMPLETE METABOLIC PANEL WITH GFR
AG Ratio: 1.5 (calc) (ref 1.0–2.5)
ALT: 25 U/L (ref 6–29)
AST: 56 U/L — ABNORMAL HIGH (ref 10–35)
Albumin: 3.3 g/dL — ABNORMAL LOW (ref 3.6–5.1)
Alkaline phosphatase (APISO): 319 U/L — ABNORMAL HIGH (ref 37–153)
BUN: 11 mg/dL (ref 7–25)
CO2: 33 mmol/L — ABNORMAL HIGH (ref 20–32)
Calcium: 9.6 mg/dL (ref 8.6–10.4)
Chloride: 105 mmol/L (ref 98–110)
Creat: 0.51 mg/dL (ref 0.50–1.05)
GFR, Est African American: 122 mL/min/{1.73_m2} (ref >=60)
GFR, Est Non African American: 105 mL/min/{1.73_m2} (ref >=60)
Globulin: 2.2 g/dL (calc) (ref 1.9–3.7)
Glucose, Bld: 91 mg/dL (ref 65–99)
Potassium: 4.5 mmol/L (ref 3.5–5.3)
Sodium: 144 mmol/L (ref 135–146)
Total Bilirubin: 3 mg/dL — ABNORMAL HIGH (ref 0.2–1.2)
Total Protein: 5.5 g/dL — ABNORMAL LOW (ref 6.1–8.1)

## 2020-10-03 LAB — CBC WITH DIFFERENTIAL/PLATELET
Absolute Monocytes: 1872 cells/uL — ABNORMAL HIGH (ref 200–950)
Basophils Absolute: 130 cells/uL (ref 0–200)
Basophils Relative: 1 %
Eosinophils Absolute: 286 cells/uL (ref 15–500)
Eosinophils Relative: 2.2 %
HCT: 41.8 % (ref 35.0–45.0)
Hemoglobin: 14.6 g/dL (ref 11.7–15.5)
Lymphs Abs: 3263 cells/uL (ref 850–3900)
MCH: 32.7 pg (ref 27.0–33.0)
MCHC: 34.9 g/dL (ref 32.0–36.0)
MCV: 93.5 fL (ref 80.0–100.0)
MPV: 10.8 fL (ref 7.5–12.5)
Monocytes Relative: 14.4 %
Neutro Abs: 7449 cells/uL (ref 1500–7800)
Neutrophils Relative %: 57.3 %
Platelets: 172 10*3/uL (ref 140–400)
RBC: 4.47 10*6/uL (ref 3.80–5.10)
RDW: 13.6 % (ref 11.0–15.0)
Total Lymphocyte: 25.1 %
WBC: 13 10*3/uL — ABNORMAL HIGH (ref 3.8–10.8)

## 2020-10-03 LAB — URINE CULTURE
MICRO NUMBER:: 11492871
SPECIMEN QUALITY:: ADEQUATE

## 2020-10-03 NOTE — Telephone Encounter (Signed)
10:50AM: Palliative care SW outreached patient to schedule home visit.  Visit schedule for Fri 2/11 @1pm  with RN and SW.

## 2020-10-07 ENCOUNTER — Telehealth: Payer: Self-pay

## 2020-10-07 DIAGNOSIS — K7031 Alcoholic cirrhosis of liver with ascites: Secondary | ICD-10-CM

## 2020-10-07 DIAGNOSIS — R531 Weakness: Secondary | ICD-10-CM

## 2020-10-07 DIAGNOSIS — S32010A Wedge compression fracture of first lumbar vertebra, initial encounter for closed fracture: Secondary | ICD-10-CM

## 2020-10-07 DIAGNOSIS — Z66 Do not resuscitate: Secondary | ICD-10-CM

## 2020-10-07 DIAGNOSIS — G8929 Other chronic pain: Secondary | ICD-10-CM

## 2020-10-07 NOTE — Telephone Encounter (Signed)
Patient stated that she has no pain management provider that you had refilled her medication. Will call back to discuss before refill is due

## 2020-10-07 NOTE — Telephone Encounter (Signed)
Copied from [place] [id number]. Topic: Quick Communication - See Telephone Encounter >> Oct 07, 2020 10:18 AM Loma [place] wrote: CRM for notification. See Telephone encounter for: 10/07/20. Pt has called back she states from speaking with Tapia's nurse. She stating she had already received labs but that the nurse has called her back after that and now wants to speak with her as she said she had been speaking with her regarding another issue. Pt states she does not know her Phone # but that nurse has it as she called her.  I am assuming the # she is calling from [phone] Fu with pt

## 2020-10-07 NOTE — Telephone Encounter (Signed)
No pallative care. Only have what you gave.

## 2020-10-10 ENCOUNTER — Other Ambulatory Visit: Payer: Medicare Other

## 2020-10-10 ENCOUNTER — Other Ambulatory Visit: Payer: Self-pay

## 2020-10-10 DIAGNOSIS — Z515 Encounter for palliative care: Secondary | ICD-10-CM

## 2020-10-10 NOTE — Progress Notes (Signed)
COMMUNITY PALLIATIVE CARE SW NOTE  PATIENT NAME: Anita Reed DOB: 13-Feb-1961 MRN: [id number]  PRIMARY CARE PROVIDER: Towanda Malkin, MD (Inactive)  RESPONSIBLE PARTY:  Acct ID - Guarantor Home Phone Work Phone Relationship Acct Type  [id number] SHENIA, ALAN* [phone]  Self P/F     [place], [place], [place] [address]     PLAN OF CARE and INTERVENTIONS:             1. GOALS OF CARE/ ADVANCE CARE PLANNING:  Patient is a DNR. MOST form discussed. Patient's goal is to remain at home with husband and to be comfortable.  2.         SOCIAL/EMOTIONAL/SPIRITUAL ASSESSMENT/ INTERVENTIONS:  SW and RN Almyra Free met with patient in patients home for follow up visit. Patient lives in a one story home with husband. Patient and husband updated SW and RN medical condition and changes. Patients main concern this visit is her back pain.   Husband shared that he has noticed that patient has seemed more confused over the past few days. Patients mood was irritable during this visit and she appeared disshelved and distorted and was unsteady on her feet. Husband stated this was due to the lack of sleep.  Patient shared that she continues to be in constant pain and that she is not receiving enough pain medications. Patient and husband states that she has not been sleeping well for the past couple weeks and her appetite has declined due to her pain level. Patient shares that she continues to eat a lot of fruit but no other filling meal.    No recent medication changes. Patient is not taking lactulaose. Patient shares that she is t6aking gabapentin, advil and tizanidine Q 4 hrs to address her pain currently.    SW and RN discussed hospice vs palliative care services in detail with patient to make sure she understands that palliative care does not offer pain medication or management and that we are consultative. Team explained that Hospice care can address pain management and prescribe pain medications  to manage her pain. Patient stated understanding between different services and stated that she is not ready for Hospice care. Patient and husband are aware that they can outreach palliative SW and/or SW should they want to pursue Hospice services, neither are open to hospice services at this time.  Patient would like to move forward with back/spine surgery to address her fx. Husband has called Dr. Rudene Christians office to schedule consultation, appointment made for Monday 2/14 $[email or web address]'@[email or web address]$ . SW encouraged patient to discuss recovery time frame and alternatives should she not be a candidate for the procedure. Patient state that she would but is certain that she will be able to have the procedure done. SW and RN encouraged patient to rest, eat and allow husband to assist with needs with needed.   SW discussed goals, reviewed care plan, provided emotional support, used active and reflective listening. Palliative care will continue to monitor and assist with long term care planning as needed.   3.         PATIENT/CAREGIVER EDUCATION/ COPING:  Patient A&O. Patient denies any anxiety or depression. Patient takes Prozac and feels as though its helping. Patient is open to supportive counseling.  4.         PERSONAL EMERGENCY PLAN:  Patient will call 9-1-1 for emergencies.   5.         COMMUNITY RESOURCES COORDINATION/ HEALTH CARE NAVIGATION:  Patient manages her care.  6.  FINANCIAL/LEGAL CONCERNS/INTERVENTIONS:  None.      SOCIAL HX:  Social History   Tobacco Use  . Smoking status: Current Every Day Smoker    Packs/day: 1.00    Years: 35.00    Pack years: 35.00    Types: Cigarettes  . Smokeless tobacco: Never Used  Substance Use Topics  . Alcohol use: Yes    Comment: 1 sip of wine this am    CODE STATUS: DNR  ADVANCED DIRECTIVES: Y MOST FORM COMPLETE:  Y HOSPICE EDUCATION PROVIDED: Y  PPS: Patient is independent with all ADL's    Time Spent: 1 hr.       Doreene Eland, LCSW

## 2020-10-10 NOTE — Progress Notes (Signed)
PATIENT NAME: Anita Reed DOB: [birth year] MRN: [id number]  PRIMARY CARE PROVIDER: Towanda Malkin, MD (Inactive)  RESPONSIBLE PARTY:  Acct ID - Guarantor Home Phone Work Phone Relationship Acct Type  [id number] LUTICIA, TADROS* [phone]  Self P/F     [address], [place],  [address]    PLAN OF CARE and INTERVENTIONS:               1.  GOALS OF CARE/ ADVANCE CARE PLANNING:  Patient desires to remain home for as long as possible.               2.  PATIENT/CAREGIVER EDUCATION:  Palliative Care vs Hospice               4. PERSONAL EMERGENCY PLAN:  Spouse will likely activate 911 for emergencies.               5.  DISEASE STATUS:  Joint visit completed with [place], SW, patient and spouse.  On arrival, patient is in the bathroom.  Updated spouse on our visits and he provides an update on patient.  Spouse notes patient is not eating well or sleeping due to her severe back pain related to a compression fracture.  He feels patient would have a better quality of life if her pain was better managed.  Chart reviewed and spouse is advised of referral that was made for Dr. Rudene Christians and that his office reached out to patient this week to see if she could come in Wednesday or Friday.  Spouse states he was not aware of the referral or that MD office had called.  He states patient could have been seen today as he is off.  SW was able to provide the office number for Dr. Rudene Christians and he will call the office to schedule and appointment.  Patient is taking Gabapentin, Zanaflex and Ibuprofen routinely for her pain management.  She is most comfortable in the bed.  Patient entered the room after asking spouse for assistance.  She seems irritated by our presence and asked what we wanted.  Advised of our scheduled appointment for today.  Patient states she is not feeling well due to her back pain.  We further discussed referral that was placed last week with Dr. Rudene Christians and patient stated she didn't answer  the phone.  We provided clarification on Palliative Care vs Hospice and asked if she desired to pursue hospice.   Patient states this was discussed with her last week and she does not feel like she is dying yet and did not wish to pursue hospice.   Both her and her spouse feel patient would have a better quality of life if her back pain could be addressed.  Both agree that a consult with Dr. Rudene Christians would be best at this time.  Spouse made the call to MD office and scheduled an appointment for Monday.   Palliative Care team will follow up with patient next week after her consult with Dr. Rudene Christians.   HISTORY OF PRESENT ILLNESS:  61 year old female with Cirrhosis of the Liver.  Patient is being followed by Palliative Care monthly and PRN.  CODE STATUS: DNR ADVANCED DIRECTIVES: No MOST FORM: No PPS: 50%   PHYSICAL EXAM: Not completed on this visit.      Lorenza Burton, RN

## 2020-10-11 ENCOUNTER — Inpatient Hospital Stay
Admission: EM | Admit: 2020-10-11 | Discharge: 2020-10-28 | DRG: 441 | Disposition: E | Payer: Medicare Other | Attending: Family Medicine | Admitting: Family Medicine

## 2020-10-11 ENCOUNTER — Encounter: Payer: Self-pay | Admitting: Emergency Medicine

## 2020-10-11 ENCOUNTER — Other Ambulatory Visit: Payer: Self-pay

## 2020-10-11 ENCOUNTER — Emergency Department: Payer: Medicare Other

## 2020-10-11 DIAGNOSIS — Z8572 Personal history of non-Hodgkin lymphomas: Secondary | ICD-10-CM

## 2020-10-11 DIAGNOSIS — I110 Hypertensive heart disease with heart failure: Secondary | ICD-10-CM | POA: Diagnosis present

## 2020-10-11 DIAGNOSIS — K7290 Hepatic failure, unspecified without coma: Principal | ICD-10-CM | POA: Diagnosis present

## 2020-10-11 DIAGNOSIS — N179 Acute kidney failure, unspecified: Secondary | ICD-10-CM | POA: Diagnosis not present

## 2020-10-11 DIAGNOSIS — R1310 Dysphagia, unspecified: Secondary | ICD-10-CM | POA: Diagnosis present

## 2020-10-11 DIAGNOSIS — K7031 Alcoholic cirrhosis of liver with ascites: Secondary | ICD-10-CM | POA: Diagnosis present

## 2020-10-11 DIAGNOSIS — R001 Bradycardia, unspecified: Secondary | ICD-10-CM | POA: Diagnosis present

## 2020-10-11 DIAGNOSIS — E872 Acidosis: Secondary | ICD-10-CM | POA: Diagnosis present

## 2020-10-11 DIAGNOSIS — K767 Hepatorenal syndrome: Secondary | ICD-10-CM | POA: Diagnosis not present

## 2020-10-11 DIAGNOSIS — K7460 Unspecified cirrhosis of liver: Secondary | ICD-10-CM | POA: Diagnosis present

## 2020-10-11 DIAGNOSIS — E7220 Disorder of urea cycle metabolism, unspecified: Secondary | ICD-10-CM

## 2020-10-11 DIAGNOSIS — F32A Depression, unspecified: Secondary | ICD-10-CM | POA: Diagnosis present

## 2020-10-11 DIAGNOSIS — K7682 Hepatic encephalopathy: Secondary | ICD-10-CM | POA: Diagnosis present

## 2020-10-11 DIAGNOSIS — Z79899 Other long term (current) drug therapy: Secondary | ICD-10-CM

## 2020-10-11 DIAGNOSIS — I480 Paroxysmal atrial fibrillation: Secondary | ICD-10-CM | POA: Diagnosis present

## 2020-10-11 DIAGNOSIS — J441 Chronic obstructive pulmonary disease with (acute) exacerbation: Secondary | ICD-10-CM | POA: Diagnosis not present

## 2020-10-11 DIAGNOSIS — I959 Hypotension, unspecified: Secondary | ICD-10-CM | POA: Diagnosis not present

## 2020-10-11 DIAGNOSIS — Z90710 Acquired absence of both cervix and uterus: Secondary | ICD-10-CM

## 2020-10-11 DIAGNOSIS — I951 Orthostatic hypotension: Secondary | ICD-10-CM | POA: Diagnosis present

## 2020-10-11 DIAGNOSIS — N390 Urinary tract infection, site not specified: Secondary | ICD-10-CM

## 2020-10-11 DIAGNOSIS — I5033 Acute on chronic diastolic (congestive) heart failure: Secondary | ICD-10-CM | POA: Diagnosis not present

## 2020-10-11 DIAGNOSIS — J9601 Acute respiratory failure with hypoxia: Secondary | ICD-10-CM | POA: Diagnosis not present

## 2020-10-11 DIAGNOSIS — Z9119 Patient's noncompliance with other medical treatment and regimen: Secondary | ICD-10-CM

## 2020-10-11 DIAGNOSIS — F17210 Nicotine dependence, cigarettes, uncomplicated: Secondary | ICD-10-CM | POA: Diagnosis present

## 2020-10-11 DIAGNOSIS — R4182 Altered mental status, unspecified: Secondary | ICD-10-CM

## 2020-10-11 DIAGNOSIS — Z20822 Contact with and (suspected) exposure to covid-19: Secondary | ICD-10-CM | POA: Diagnosis present

## 2020-10-11 DIAGNOSIS — Z66 Do not resuscitate: Secondary | ICD-10-CM | POA: Diagnosis present

## 2020-10-11 DIAGNOSIS — K219 Gastro-esophageal reflux disease without esophagitis: Secondary | ICD-10-CM | POA: Diagnosis present

## 2020-10-11 DIAGNOSIS — K580 Irritable bowel syndrome with diarrhea: Secondary | ICD-10-CM | POA: Diagnosis present

## 2020-10-11 DIAGNOSIS — K222 Esophageal obstruction: Secondary | ICD-10-CM | POA: Diagnosis present

## 2020-10-11 DIAGNOSIS — Z515 Encounter for palliative care: Secondary | ICD-10-CM

## 2020-10-11 DIAGNOSIS — R296 Repeated falls: Secondary | ICD-10-CM | POA: Diagnosis present

## 2020-10-11 DIAGNOSIS — K766 Portal hypertension: Secondary | ICD-10-CM | POA: Diagnosis present

## 2020-10-11 HISTORY — DX: Hypotension, unspecified: I95.9

## 2020-10-11 HISTORY — DX: Wedge compression fracture of first lumbar vertebra, initial encounter for closed fracture: S32.010A

## 2020-10-11 HISTORY — DX: Functional intestinal disorder, unspecified: K59.9

## 2020-10-11 IMAGING — CR DG CHEST 1V PORT
1 series · 1 of 1 positions shown · non-contrast
Comparison: CT chest [DATE], chest x-ray [DATE]

CLINICAL DATA: Altered mental status.

EXAM:
PORTABLE CHEST 1 VIEW

[chest ap]
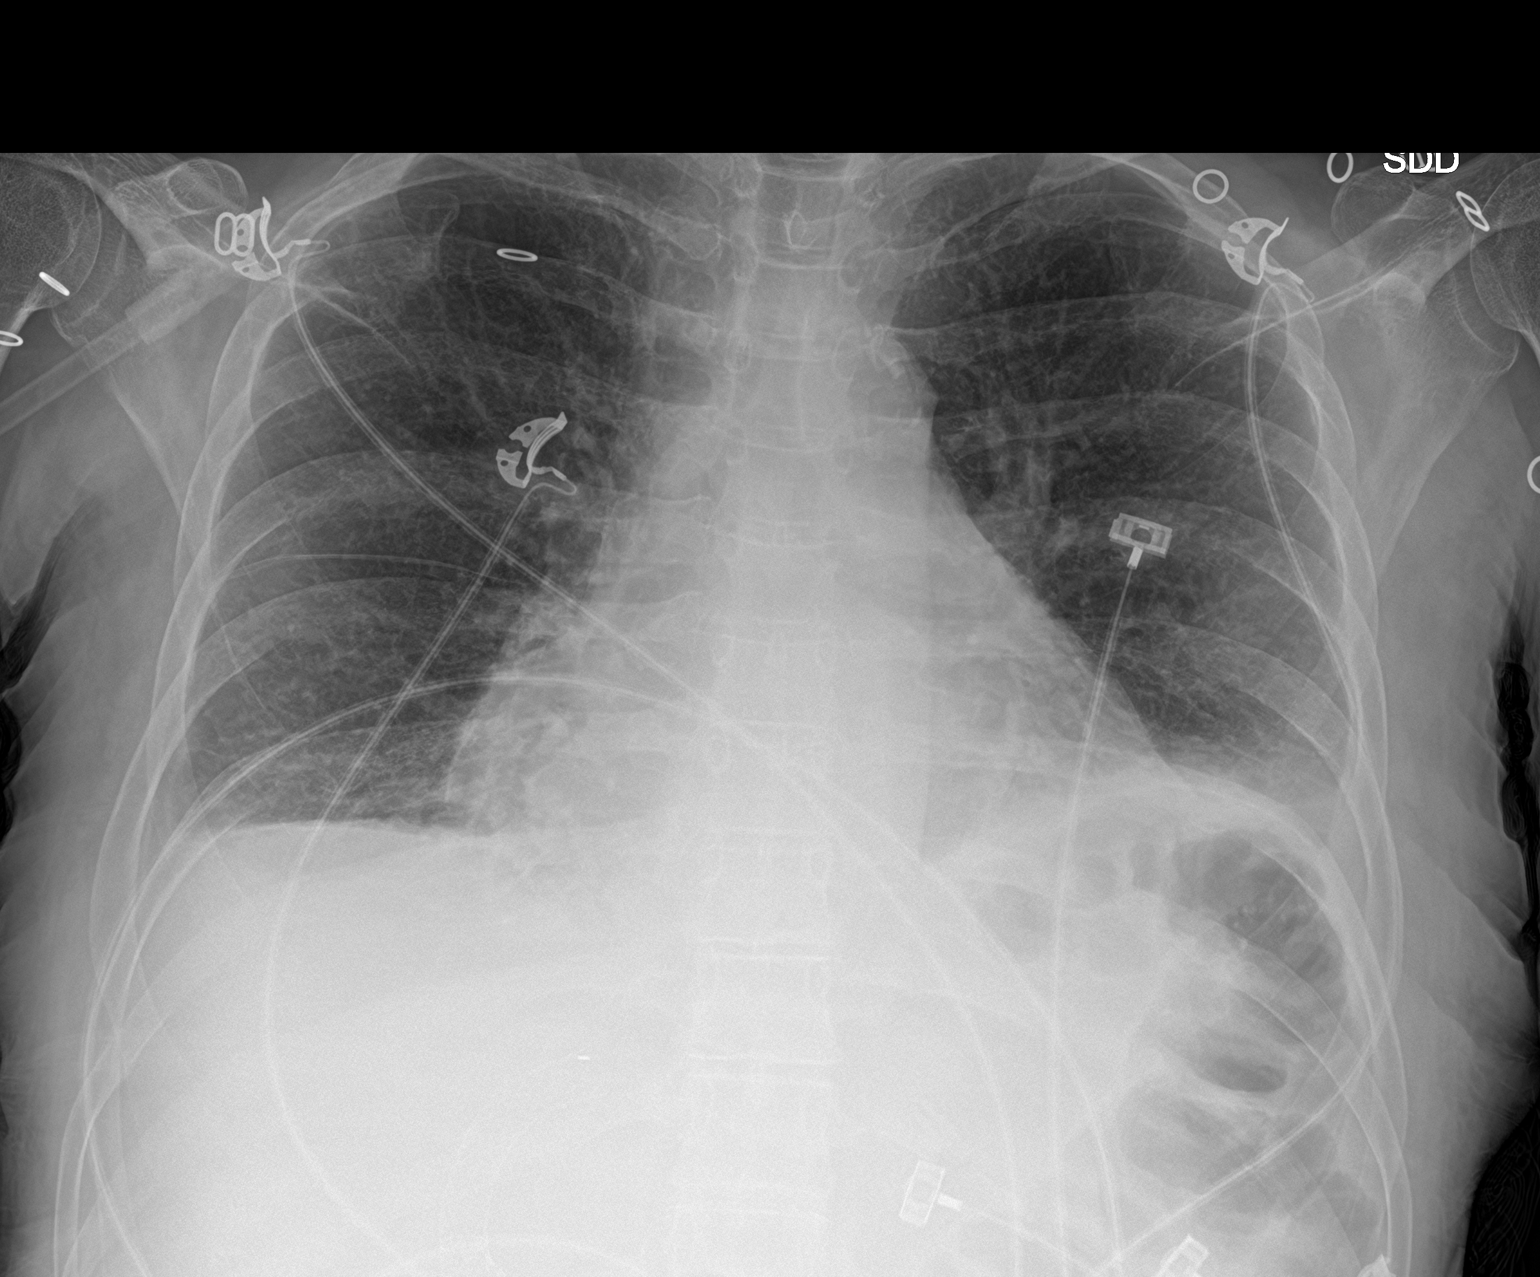

[1 of 1 positions shown; findings below may reference images not displayed]

FINDINGS: The heart size and mediastinal contours are unchanged. Aortic arch
calcification.

Biapical pleural/pulmonary scarring. Low lung volumes with bibasilar
compressive changes. No focal consolidation. No pulmonary edema.
Persistent trace bilateral pleural effusion. Elevated left
hemidiaphragm again noted. No pneumothorax.

No acute osseous abnormality.  Right upper abdomen stent noted.
IMPRESSION: 1. Low lung volumes with persistent trace bilateral pleural
effusions.
2. Similar-appearing enlarged cardiac silhouette.

## 2020-10-11 IMAGING — CT CT CERVICAL SPINE W/O CM
3 of 4 series · 13 of 27 positions shown, 15 images · non-contrast
Comparison: CT head [DATE]

CLINICAL DATA: Patient wheelchair to triage with complaints of
increased confusion for the last 2-3 days worse in the last 24
hours. Pt husband reports that pt has had 2 admissions for increased
ammonia d/t cirrhosis of the liver

EXAM:
CT HEAD WITHOUT CONTRAST
CT CERVICAL SPINE WITHOUT CONTRAST
TECHNIQUE: Multidetector CT imaging of the head and cervical spine was
performed following the standard protocol without intravenous
contrast. Multiplanar CT image reconstructions of the cervical spine
were also generated.

[Series 4: c spine soft · axial · 0.28mm/px · z∈[-170,-74]mm · 5 of 72 slices shown, 7 images (1 of 2)]
[im 12/72  soft-tissue]
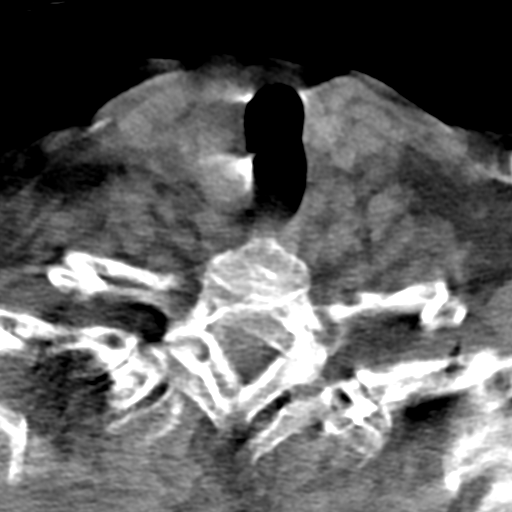
[im 12/72  bone]
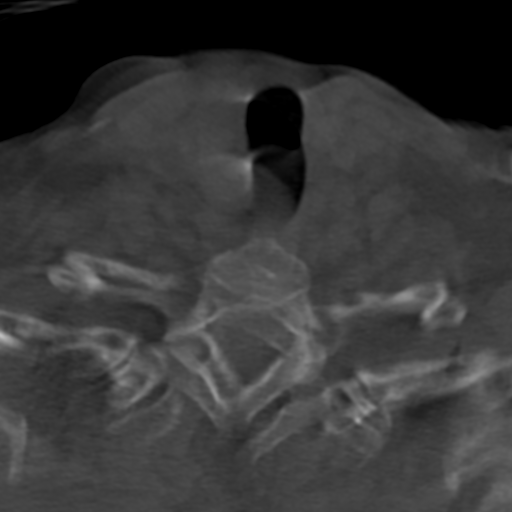
[im 24/72  bone]
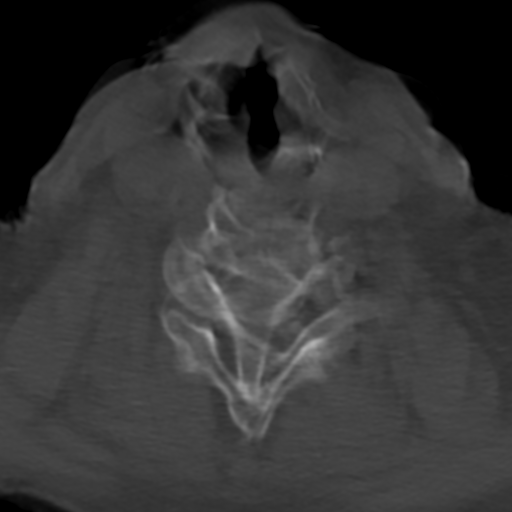
[im 36/72  bone]
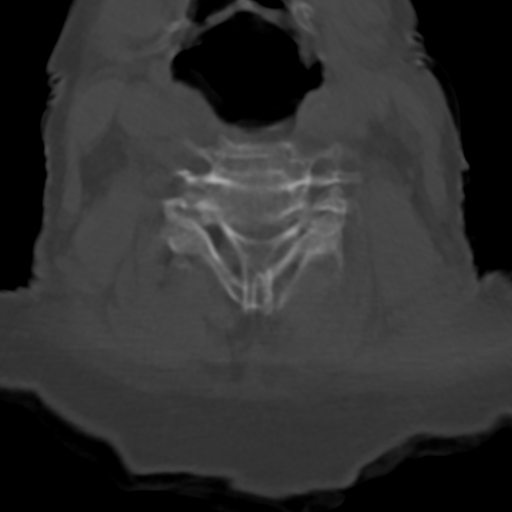
[im 48/72  bone]
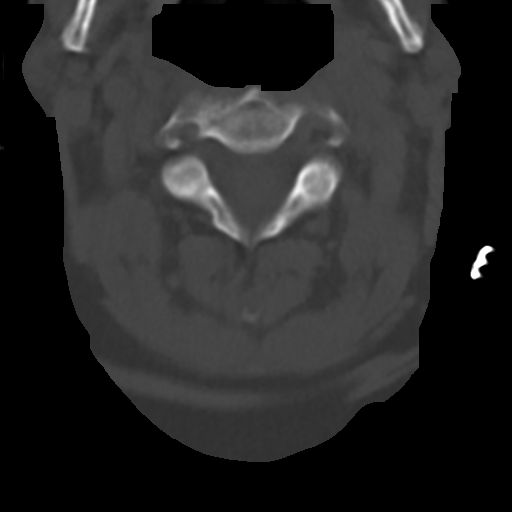
[im 60/72  soft-tissue]
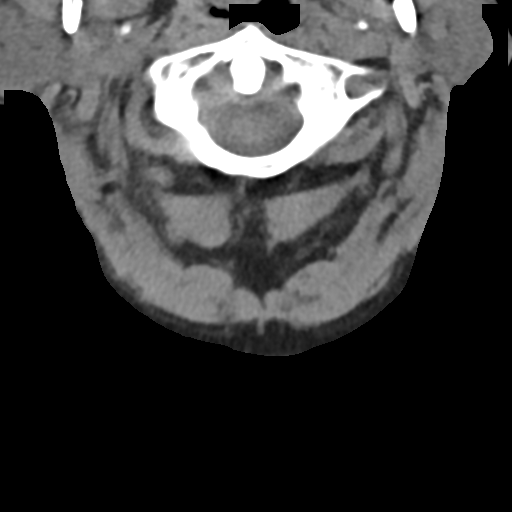
[im 60/72  bone]
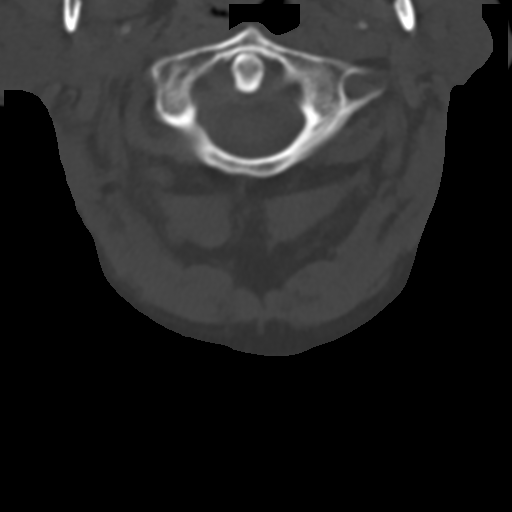

[Series 5: c spine soft · axial · 0.28mm/px · z∈[-170,-122]mm · 3 of 72 slices shown (2 of 2)]
[im 12/72  soft-tissue]
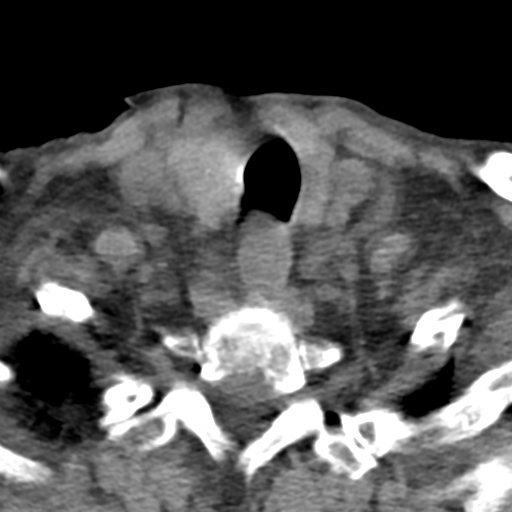
[im 24/72  soft-tissue]
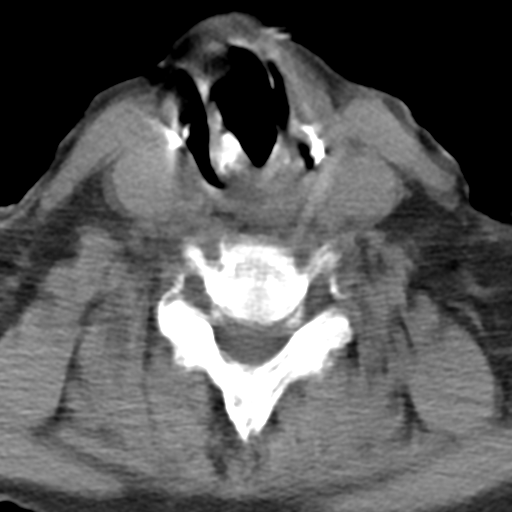
[im 36/72  soft-tissue]
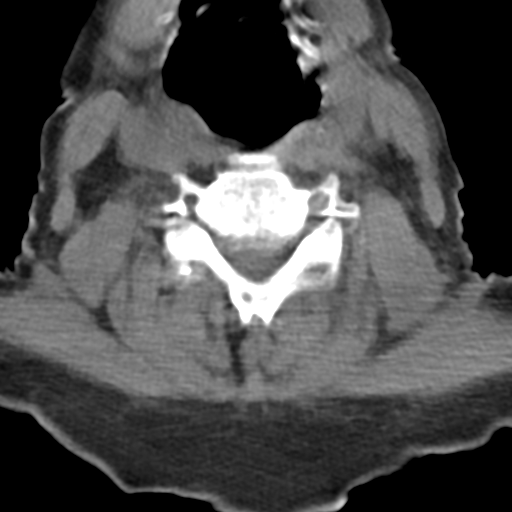

[Series 6: sagittal bone · sagittal · 0.24mm/px · 5 of 90 slices shown]
[im 15/90  bone]
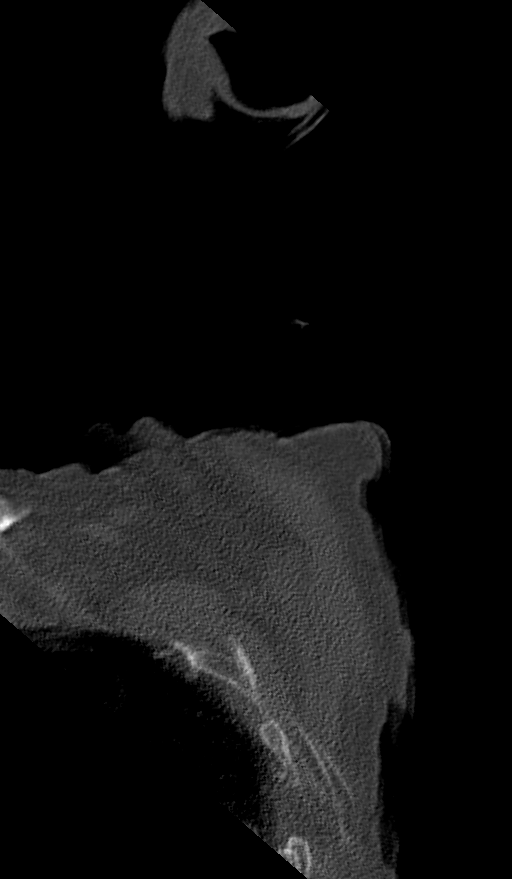
[im 30/90  bone]
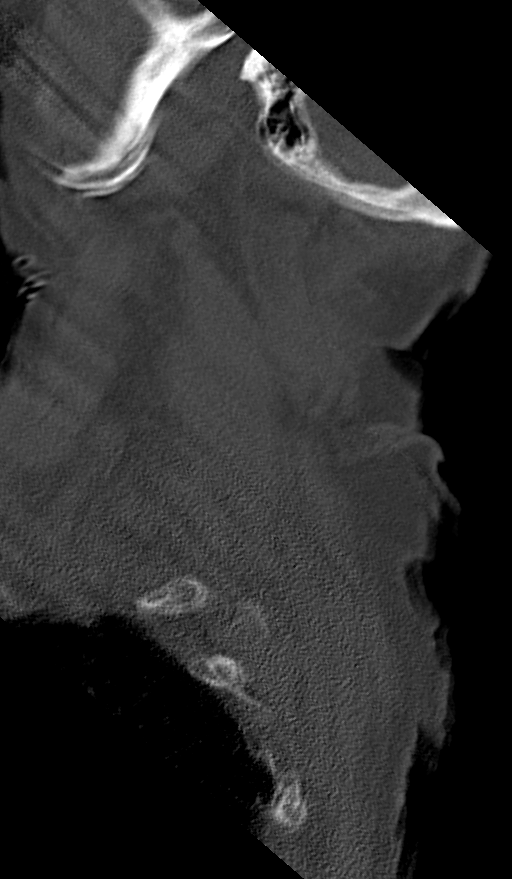
[im 45/90  bone]
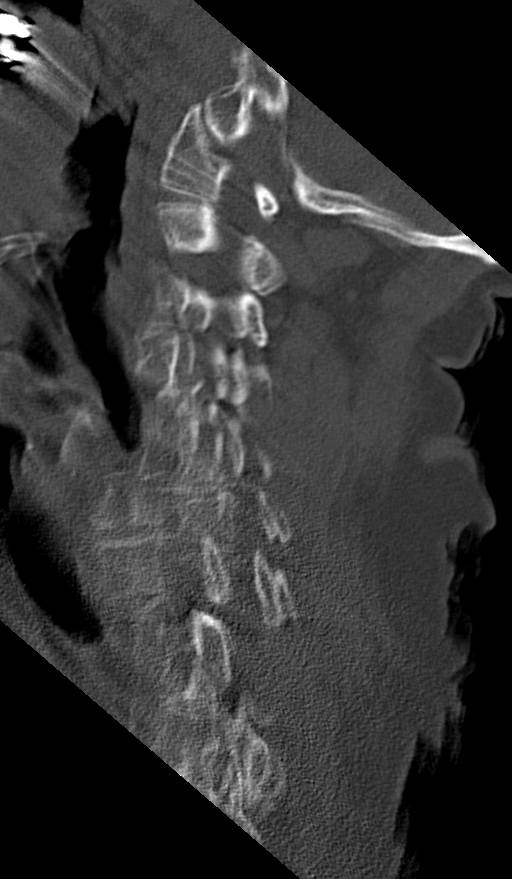
[im 60/90  bone]
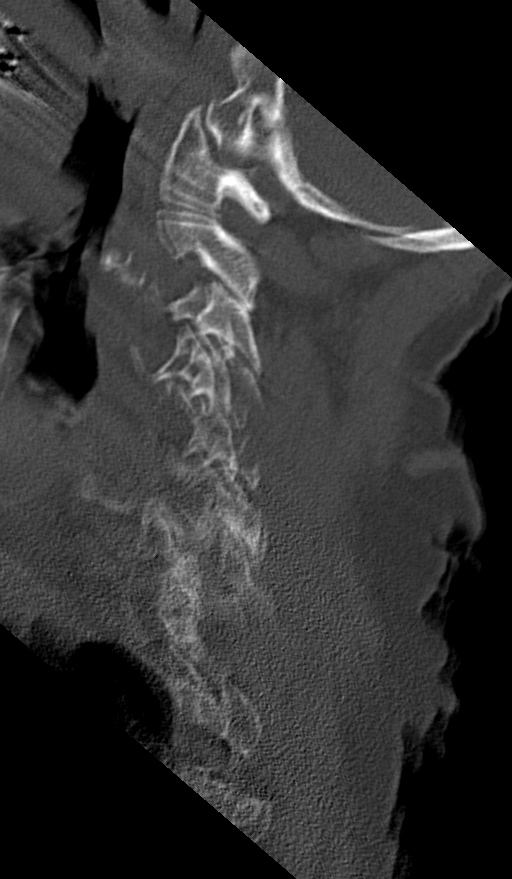
[im 75/90  bone]
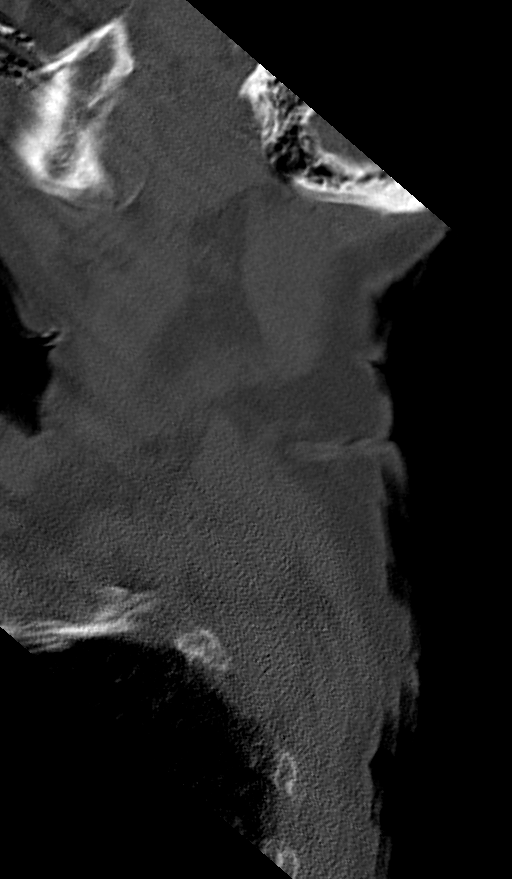

[13 of 27 positions shown; findings below may reference images not displayed]

FINDINGS: CT HEAD FINDINGS

Brain:

Patchy and confluent areas of decreased attenuation are noted
throughout the deep and periventricular white matter of the cerebral
hemispheres bilaterally, compatible with chronic microvascular
ischemic disease.

No evidence of large-territorial acute infarction. No parenchymal
hemorrhage. No mass lesion. No extra-axial collection.

No mass effect or midline shift. No hydrocephalus. Basilar cisterns
are patent.

Vascular: No hyperdense vessel.

Skull: No acute fracture or focal lesion.

Sinuses/Orbits: Paranasal sinuses and mastoid air cells are clear.
The orbits are unremarkable.

Other: None.

CT CERVICAL SPINE FINDINGS

Alignment: Unable to evaluate.

Skull base and vertebrae: Markedly limited evaluation.

Soft tissues and spinal canal: No prevertebral fluid or swelling. No
visible canal hematoma.

Upper chest: No large apical pneumothorax.

Other: None.
IMPRESSION: 1. No acute intracranial abnormality.
2. Nondiagnostic CT cervical spine. A fracture or traumatic
listhesis cannot be excluded. Recommend repeat due to motion
artifact.

## 2020-10-11 IMAGING — CT CT HEAD W/O CM
4 of 8 series · 16 of 47 positions shown, 18 images · non-contrast
Comparison: CT head [DATE]

CLINICAL DATA: Patient wheelchair to triage with complaints of
increased confusion for the last 2-3 days worse in the last 24
hours. Pt husband reports that pt has had 2 admissions for increased
ammonia d/t cirrhosis of the liver

EXAM:
CT HEAD WITHOUT CONTRAST
CT CERVICAL SPINE WITHOUT CONTRAST
TECHNIQUE: Multidetector CT imaging of the head and cervical spine was
performed following the standard protocol without intravenous
contrast. Multiplanar CT image reconstructions of the cervical spine
were also generated.

[Series 2: head wo · axial · 0.41mm/px · z∈[-58,+62]mm · 6 of 34 slices shown, 8 images (1 of 2)]
[im 5/34  brain]
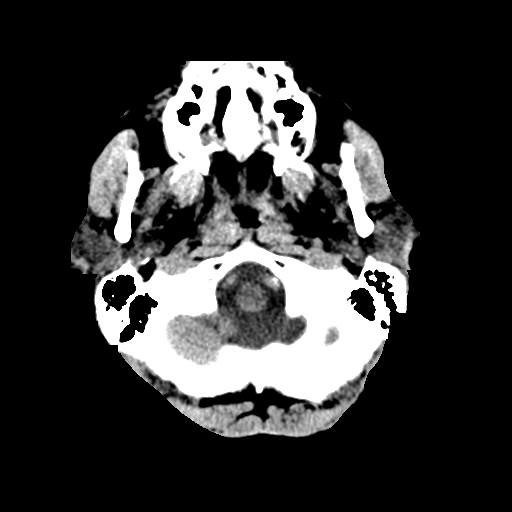
[im 5/34  bone]
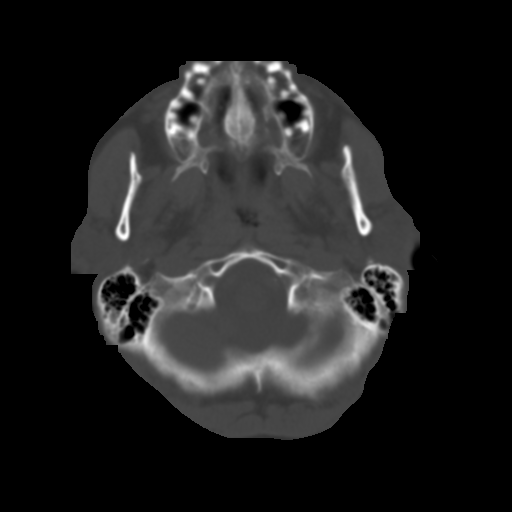
[im 10/34  brain]
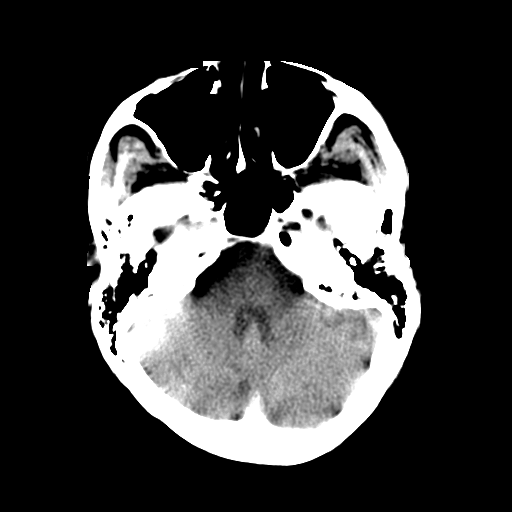
[im 15/34  brain]
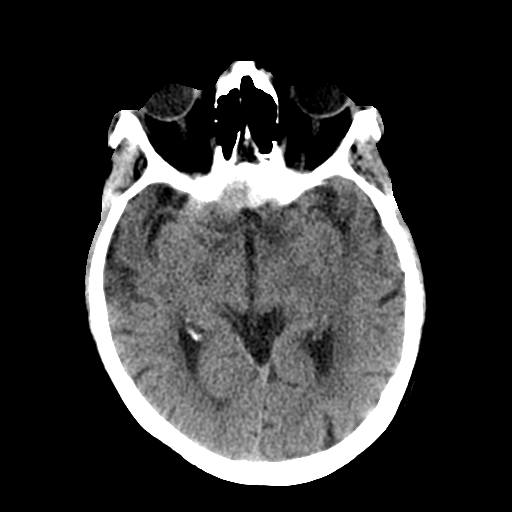
[im 19/34  brain]
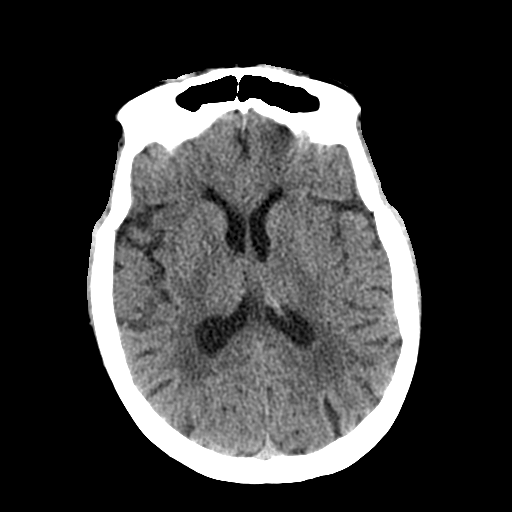
[im 24/34  brain]
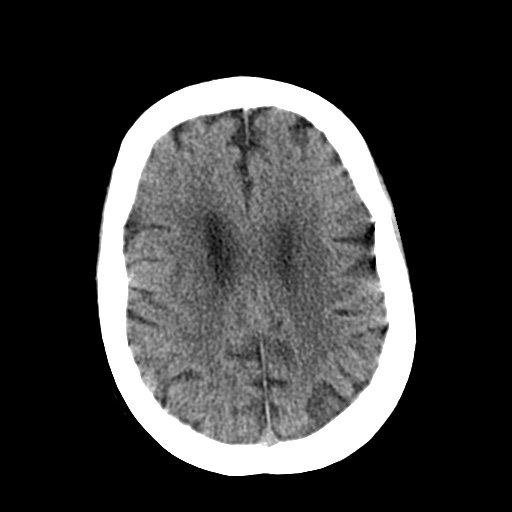
[im 24/34  bone]
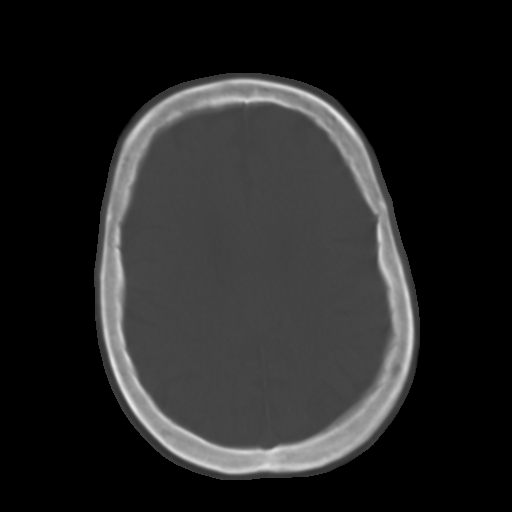
[im 29/34  brain]
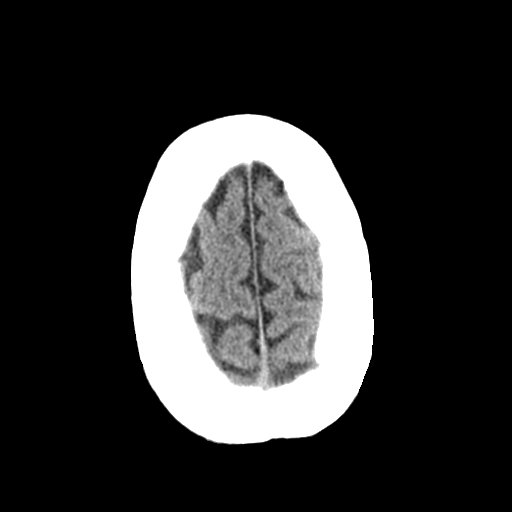

[Series 3: head wo · axial · 0.41mm/px · z∈[-58,+37]mm · 5 of 34 slices shown (2 of 2)]
[im 5/34  brain]
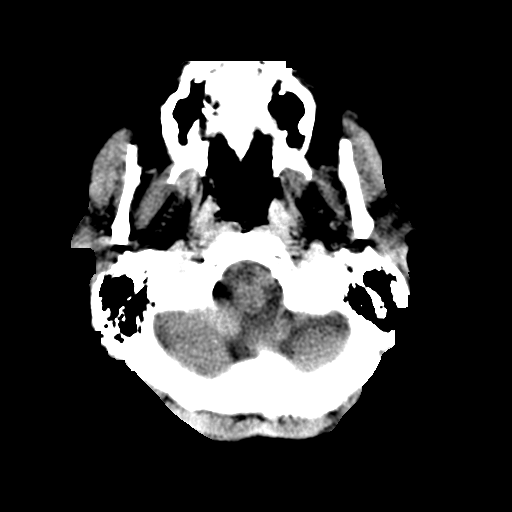
[im 10/34  brain]
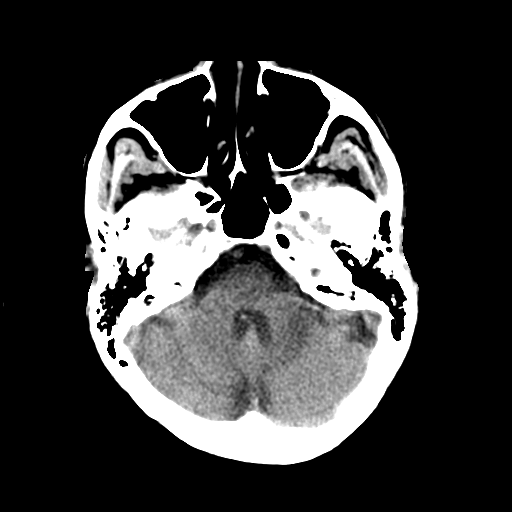
[im 15/34  brain]
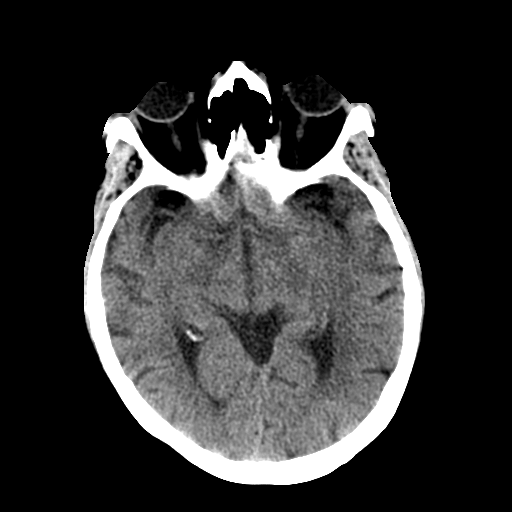
[im 19/34  brain]
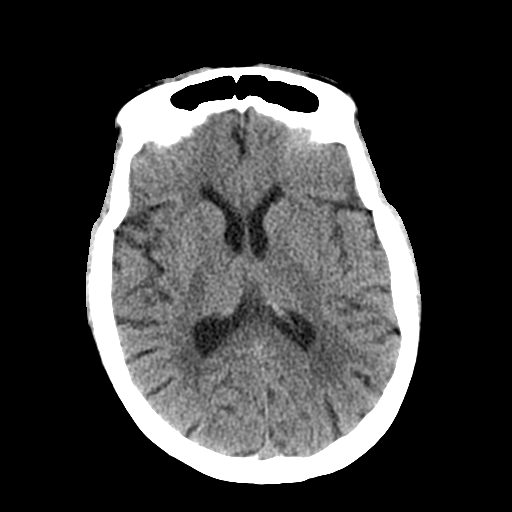
[im 24/34  brain]
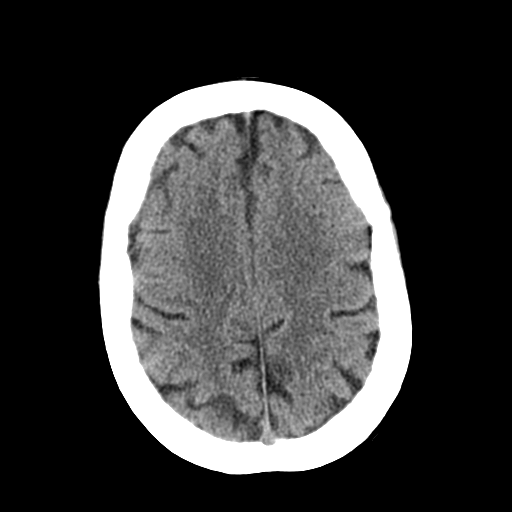

[Series 8: coronal soft tissue · coronal · 0.30mm/px · 3 of 64 slices shown]
[im 16/64  brain]
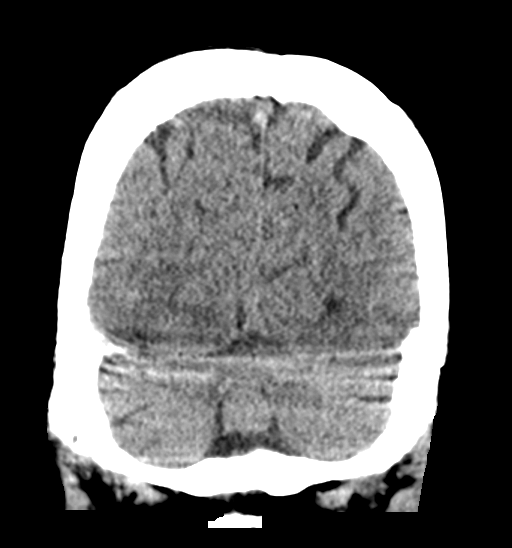
[im 32/64  brain]
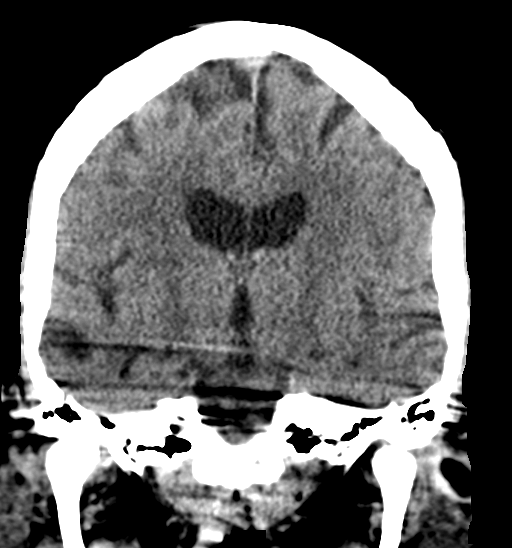
[im 48/64  brain]
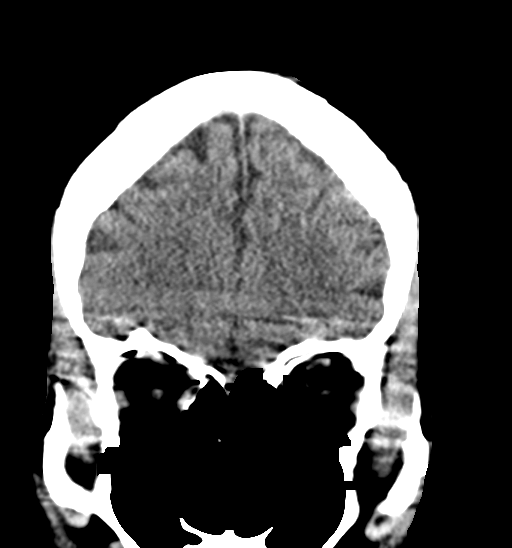

[Series 11: sagittal soft tissue · sagittal · 0.33mm/px · 2 of 51 slices shown]
[im 17/51  brain]
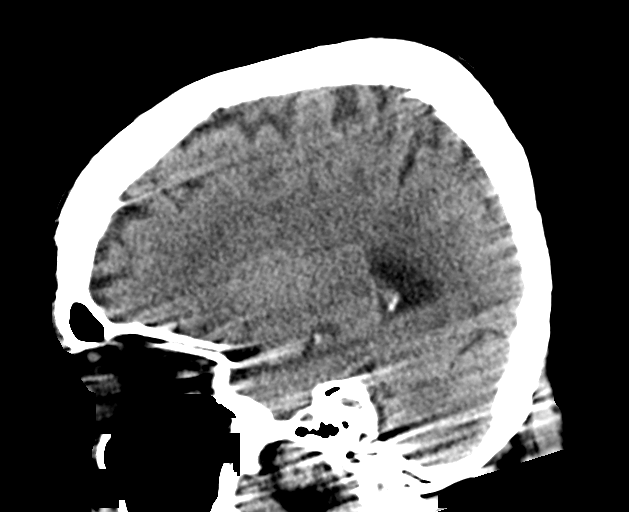
[im 34/51  brain]
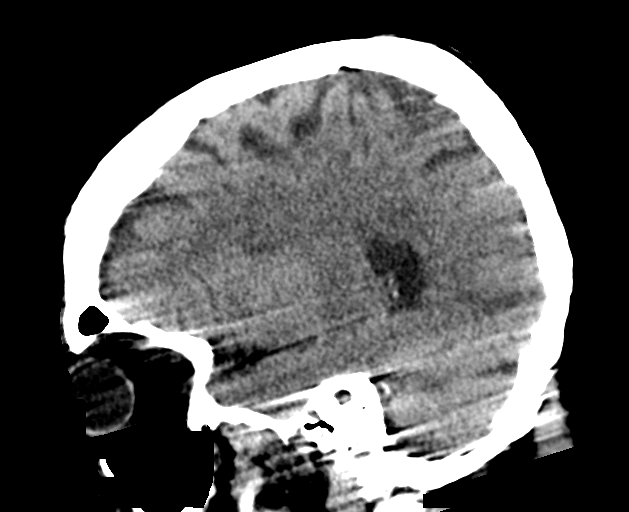

[16 of 47 positions shown; findings below may reference images not displayed]

FINDINGS: CT HEAD FINDINGS

Brain:

Patchy and confluent areas of decreased attenuation are noted
throughout the deep and periventricular white matter of the cerebral
hemispheres bilaterally, compatible with chronic microvascular
ischemic disease.

No evidence of large-territorial acute infarction. No parenchymal
hemorrhage. No mass lesion. No extra-axial collection.

No mass effect or midline shift. No hydrocephalus. Basilar cisterns
are patent.

Vascular: No hyperdense vessel.

Skull: No acute fracture or focal lesion.

Sinuses/Orbits: Paranasal sinuses and mastoid air cells are clear.
The orbits are unremarkable.

Other: None.

CT CERVICAL SPINE FINDINGS

Alignment: Unable to evaluate.

Skull base and vertebrae: Markedly limited evaluation.

Soft tissues and spinal canal: No prevertebral fluid or swelling. No
visible canal hematoma.

Upper chest: No large apical pneumothorax.

Other: None.
IMPRESSION: 1. No acute intracranial abnormality.
2. Nondiagnostic CT cervical spine. A fracture or traumatic
listhesis cannot be excluded. Recommend repeat due to motion
artifact.

## 2020-10-11 MED ORDER — SODIUM CHLORIDE 0.9 % IV BOLUS
1000.0000 mL | Freq: Once | INTRAVENOUS | Status: AC
  Administered 2020-10-12: 1000 mL via INTRAVENOUS

## 2020-10-11 NOTE — ED Notes (Signed)
Lab called for blood, requested help for difficult draw

## 2020-10-11 NOTE — ED Provider Notes (Addendum)
[facility] Emergency Department Provider Note   ____________________________________________   None    (approximate)  I have reviewed the triage vital signs and the nursing notes.   HISTORY  Chief Complaint Altered Mental Status  Level of V caveat: Limited by confusion  HPI Anita Reed is a 60 y.o. female brought to the ED from home by her husband with a chief complaint of confusion.  Patient has a history of alcoholic cirrhosis, esophageal varices, portal hypertension who admits she stopped taking her lactulose 2 weeks ago because it was inconvenient for her to leave her house due to having increased bowel movements.  Spouse told triage nurse that he has noted increased confusion for the past 2 to 3 days, worse in the past 24 hours.  Patient tells me she has had multiple falls within the past 24 hours.  Complains of neck pain and difficulty breathing.  Denies fever, cough, chest pain, abdominal pain, nausea or vomiting.     Past Medical History:  Diagnosis Date  . Anemia   . Atrial flutter ([place])   . Cancer [facility])    lymphoma 11 yrs. ago  . Cirrhosis ([place])   . Dysrhythmia   . Esophageal varices ([place])   . GERD (gastroesophageal reflux disease)   . Hepatitis    hep C-resolved  . History of hiatal hernia   . Hypertension   . Hypotension   . Pneumonia   . Portal hypertension [facility])     Patient Active Problem List   Diagnosis Date Noted  . Hepatic encephalopathy ([place]) 10/12/2020  . History of GI bleed 07/29/2020  . Underweight 07/29/2020  . Falls, initial encounter 07/29/2020  . Acute hepatic encephalopathy 07/29/2020  . Traumatic compression fracture of L1 lumbar vertebra ([place]) 07/29/2020  . Elevated troponin 07/29/2020  . Leucocytosis 07/29/2020  . AF (paroxysmal atrial fibrillation) ([place]) 07/29/2020  . Septic shock ([place]) 05/31/2020  . Healthcare-associated pneumonia   .  Alcoholic Cirrhosis of liver without ascites ([place])   . Epigastric  hernia 03/06/2020  . Major depressive disorder with single episode, in partial remission ([place]) 11/28/2019  . Neuropathy 11/28/2019  . Dysthymia 07/27/2019  . Other chronic pain 07/27/2019  . Muscle spasticity 07/27/2019  . C. difficile colitis   . Hypokalemia 07/15/2019  . Bandemia 07/15/2019  . Hypotension   . Goals of care, counseling/discussion   . Palliative care encounter   . Muscle cramping 06/22/2019  . Alcohol abuse 06/22/2019  . Sepsis ([place]) 06/22/2019  . GIB (gastrointestinal bleeding) 06/01/2019  . GI bleeding 06/01/2019  . History of lymphoma 02/26/2019  . Atrial flutter ([place]) 02/07/2019  . Alcoholic cirrhosis of liver with ascites ([place]) 02/07/2019  . Persistent atrial fibrillation ([place]) 02/07/2019  . Anemia 02/07/2019  . Anorexia 02/07/2019  . GI bleed 01/28/2019  . Ascites 01/18/2019  . Cancer [facility])     Past Surgical History:  Procedure Laterality Date  . ABDOMINAL HYSTERECTOMY    . APPENDECTOMY    . COLONOSCOPY N/A 06/03/2019   Procedure: COLONOSCOPY;  Surgeon: Toledo, Benay Pike, MD;  Location: [facility] ENDOSCOPY;  Service: Gastroenterology;  Laterality: N/A;  . EPIGASTRIC HERNIA REPAIR N/A 03/21/2020   Procedure: HERNIA REPAIR EPIGASTRIC ADULT, Open;  Surgeon: [name], MD;  Location: [facility] ORS;  Service: General;  Laterality: N/A;  . ESOPHAGOGASTRODUODENOSCOPY N/A 01/29/2019   Procedure: ESOPHAGOGASTRODUODENOSCOPY (EGD);  Surgeon: [name], MD;  Location: [facility] ENDOSCOPY;  Service: Gastroenterology;  Laterality: N/A;  . ESOPHAGOGASTRODUODENOSCOPY N/A 06/01/2019   Procedure:  ESOPHAGOGASTRODUODENOSCOPY (EGD);  Surgeon: Toledo, Benay Pike, MD;  Location: [facility] ENDOSCOPY;  Service: Gastroenterology;  Laterality: N/A;  . IR PARACENTESIS  08/27/2019  . IR RADIOLOGIST EVAL & MGMT  08/08/2019  . IR RADIOLOGIST EVAL & MGMT  11/14/2019  . IR TIPS  08/27/2019  . RADIOLOGY WITH ANESTHESIA N/A 08/27/2019   Procedure: IR WITH ANESTHESIA TIPS PROCEDURE;  Surgeon:  [name], MD;  Location: [place];  Service: Radiology;  Laterality: N/A;  . TONSILLECTOMY      Prior to Admission medications   Medication Sig Start Date End Date Taking? Authorizing Provider  digoxin (LANOXIN) 0.125 MG tablet Take 1 tablet by mouth once daily 05/22/20  Yes Gollan, Kathlene November, MD  furosemide (LASIX) 40 MG tablet Take 1 tablet (40 mg total) by mouth daily. 06/18/20  Yes Minna Merritts, MD  gabapentin (NEURONTIN) 400 MG capsule Take 400 mg po qam, 800 mg po q midday, and 400 mg po qpm daily for severe chronic pain 10/02/20  Yes Tapia, Leisa, PA-C  melatonin 5 MG TABS Take 5 mg by mouth at bedtime as needed.   Yes [provider]  midodrine (PROAMATINE) 10 MG tablet Take 1 tablet (10 mg total) by mouth 3 (three) times daily with meals. 06/18/20  Yes Gollan, Kathlene November, MD  pantoprazole (PROTONIX) 20 MG tablet Take 1 tablet (20 mg total) by mouth every morning. One hour before breakfast 02/26/20  Yes Delsa Grana, PA-C  potassium chloride (KLOR-CON) 10 MEQ tablet Take 1 tablet (10 mEq total) by mouth daily. 06/18/20  Yes Gollan, Kathlene November, MD  tiZANidine (ZANAFLEX) 4 MG capsule Take 1-2 capsules (4-8 mg total) by mouth 3 (three) times daily as needed for muscle spasms. 10/02/20  Yes Delsa Grana, PA-C  lactulose (CHRONULAC) 10 GM/15ML solution Take 45 mLs (30 g total) by mouth 3 (three) times daily. Patient not taking: Reported on 10/12/2020 08/28/19   [name], NP  metoprolol succinate (TOPROL-XL) 25 MG 24 hr tablet Take 25 mg by mouth daily. 10/04/20   [provider]    Allergies Patient has no known allergies.  Family History  Problem Relation Age of Onset  . Cancer Mother   . Cancer Father     Social History Social History   Tobacco Use  . Smoking status: Current Every Day Smoker    Packs/day: 1.00    Years: 35.00    Pack years: 35.00    Types: Cigarettes  . Smokeless tobacco: Never Used  Vaping Use  . Vaping Use: Never used   Substance Use Topics  . Alcohol use: Yes    Comment: 1 sip of wine this am  . Drug use: Never    Review of Systems  Constitutional: No fever/chills Eyes: No visual changes. ENT: No sore throat. Cardiovascular: Denies chest pain. Respiratory: Denies shortness of breath. Gastrointestinal: No abdominal pain.  No nausea, no vomiting.  No diarrhea.  No constipation. Genitourinary: Negative for dysuria. Musculoskeletal: Positive for neck pain.  Negative for back pain. Skin: Negative for rash. Neurological: Positive for confusion.  Negative for headaches, focal weakness or numbness.   ____________________________________________   PHYSICAL EXAM:  VITAL SIGNS: ED Triage Vitals  Enc Vitals Group     BP 09/30/2020 2250 (!) 84/46     Pulse Rate 10/17/2020 2250 (!) 54     Resp 10/05/2020 2250 (!) 22     Temp 10/12/2020 2250 97.9 F (36.6 C)     Temp Source 10/10/2020 2250 Oral  SpO2 10/01/2020 2250 97 %     Weight 10/16/2020 2251 160 lb (72.6 kg)     Height 10/16/2020 2251 5\' 6"  (1.676 m)     Head Circumference --      Peak Flow --      Pain Score 10/18/2020 2250 10     Pain Loc --      Pain Edu? --      Excl. in [place]? --     Constitutional: Alert.  Disheveled appearing and in mild acute distress. Eyes: Scleral icterus. PERRL. EOMI. Head: Atraumatic. Nose: No congestion/rhinnorhea. Mouth/Throat: Mucous membranes are mildly dry.   Neck: No stridor.  No cervical spine tenderness to palpation.  No puffs or deformities noted. Cardiovascular: Normal rate, regular rhythm. Grossly normal heart sounds.  Good peripheral circulation. Respiratory: Normal respiratory effort.  No retractions. Lungs CTAB. Gastrointestinal: Soft and mildly tender to palpation right upper quadrant without rebound or guarding.  Hepatomegaly. No distention. No abdominal bruits. No CVA tenderness. Musculoskeletal: No lower extremity tenderness nor edema.  No joint effusions. Neurologic: Alert and oriented to person.   Intermittently confused.  Normal speech and language. No gross focal neurologic deficits are appreciated. MAEx4. Skin:  Skin is warm, dry and intact. No rash noted. Psychiatric: Mood and affect are normal. Speech and behavior are normal.  ____________________________________________   LABS (all labs ordered are listed, but only abnormal results are displayed)  Labs Reviewed  LACTIC ACID, PLASMA - Abnormal; Notable for the following components:      Result Value   Lactic Acid, Venous 2.2 (*)    All other components within normal limits  LACTIC ACID, PLASMA - Abnormal; Notable for the following components:   Lactic Acid, Venous 2.5 (*)    All other components within normal limits  URINALYSIS, COMPLETE (UACMP) WITH MICROSCOPIC - Abnormal; Notable for the following components:   Color, Urine AMBER (*)    APPearance CLOUDY (*)    Hgb urine dipstick MODERATE (*)    Ketones, ur 5 (*)    Protein, ur >=300 (*)    Leukocytes,Ua LARGE (*)    Bacteria, UA MANY (*)    All other components within normal limits  CBC - Abnormal; Notable for the following components:   WBC 10.8 (*)    RBC 3.80 (*)    RDW 16.3 (*)    All other components within normal limits  AMMONIA - Abnormal; Notable for the following components:   Ammonia 78 (*)    All other components within normal limits  COMPREHENSIVE METABOLIC PANEL - Abnormal; Notable for the following components:   Glucose, Bld 102 (*)    BUN 33 (*)    Creatinine, Ser 1.89 (*)    AST 77 (*)    Alkaline Phosphatase 252 (*)    Total Bilirubin 3.3 (*)    GFR, Estimated 30 (*)    All other components within normal limits  TROPONIN I (HIGH SENSITIVITY) - Abnormal; Notable for the following components:   Troponin I (High Sensitivity) 44 (*)    All other components within normal limits  TROPONIN I (HIGH SENSITIVITY) - Abnormal; Notable for the following components:   Troponin I (High Sensitivity) 46 (*)    All other components within normal limits   RESP PANEL BY RT-PCR (FLU A&B, COVID) ARPGX2  CULTURE, BLOOD (ROUTINE X 2)  CULTURE, BLOOD (ROUTINE X 2)  URINE CULTURE  LIPASE, BLOOD  ETHANOL  COMPREHENSIVE METABOLIC PANEL  CBC  AMMONIA  CBG MONITORING, ED  ____________________________________________  EKG  ED ECG REPORT I, Zuzanna Maroney J, the attending physician, personally viewed and interpreted this ECG.   Date: 10/08/2020  EKG Time: 2322  Rate: 52  Rhythm: sinus bradycardia  Axis: Normal  Intervals:none  ST&T Change: Nonspecific  ____________________________________________  RADIOLOGY I, Joey Hudock J, personally viewed and evaluated these images (plain radiographs) as part of my medical decision making, as well as reviewing the written report by the radiologist.  ED MD interpretation: No ICH; small bilateral trace pleural effusion; initial CT cervical spine indeterminate secondary to motion artifact, repeat is unremarkable  Official radiology report(s): CT Head Wo Contrast  Result Date: 10/12/2020 CLINICAL DATA:  Patient wheelchair to triage with complaints of increased confusion for the last 2-3 days worse in the last 24 hours. Pt husband reports that pt has had 2 admissions for increased ammonia d/t cirrhosis of the liver EXAM: CT HEAD WITHOUT CONTRAST CT CERVICAL SPINE WITHOUT CONTRAST TECHNIQUE: Multidetector CT imaging of the head and cervical spine was performed following the standard protocol without intravenous contrast. Multiplanar CT image reconstructions of the cervical spine were also generated. COMPARISON:  CT head 07/29/2020 FINDINGS: CT HEAD FINDINGS Brain: Patchy and confluent areas of decreased attenuation are noted throughout the deep and periventricular white matter of the cerebral hemispheres bilaterally, compatible with chronic microvascular ischemic disease. No evidence of large-territorial acute infarction. No parenchymal hemorrhage. No mass lesion. No extra-axial collection. No mass effect or midline  shift. No hydrocephalus. Basilar cisterns are patent. Vascular: No hyperdense vessel. Skull: No acute fracture or focal lesion. Sinuses/Orbits: Paranasal sinuses and mastoid air cells are clear. The orbits are unremarkable. Other: None. CT CERVICAL SPINE FINDINGS Alignment: Unable to evaluate. Skull base and vertebrae: Markedly limited evaluation. Soft tissues and spinal canal: No prevertebral fluid or swelling. No visible canal hematoma. Upper chest: No large apical pneumothorax. Other: None. IMPRESSION: 1. No acute intracranial abnormality. 2. Nondiagnostic CT cervical spine. A fracture or traumatic listhesis cannot be excluded. Recommend repeat due to motion artifact. Electronically Signed   By: Iven Finn M.D.   On: 10/12/2020 00:12   CT Cervical Spine Wo Contrast  Result Date: 10/12/2020 CLINICAL DATA:  Neck trauma, intoxicated or obtunded EXAM: CT CERVICAL SPINE WITHOUT CONTRAST TECHNIQUE: Multidetector CT imaging of the cervical spine was performed without intravenous contrast. Multiplanar CT image reconstructions were also generated. COMPARISON:  CT cervical spine 10/12/2020 FINDINGS: Limited evaluation due to motion artifact. Alignment: Reversal of the normal cervical lordosis likely due to positioning. Skull base and vertebrae: Multilevel mild degenerative changes of the spine. Lucency through the C5 vertebral body on sagittal view not visualized on coronal axial images and likely artifactual due to motion. No acute fracture. No aggressive appearing focal osseous lesion or focal pathologic process. Soft tissues and spinal canal: No prevertebral fluid or swelling. No visible canal hematoma. Upper chest: Unremarkable. Other: None. IMPRESSION: No definite acute displaced fracture or traumatic listhesis of the cervical spine with slightly limited evaluation due to motion artifact. Lucency through the C5 vertebral body on sagittal view not visualized on coronal axial images and likely artifactual due  to motion. Electronically Signed   By: Iven Finn M.D.   On: 10/12/2020 01:20   CT Cervical Spine Wo Contrast  Result Date: 10/12/2020 CLINICAL DATA:  Patient wheelchair to triage with complaints of increased confusion for the last 2-3 days worse in the last 24 hours. Pt husband reports that pt has had 2 admissions for increased ammonia d/t cirrhosis of the liver EXAM: CT  HEAD WITHOUT CONTRAST CT CERVICAL SPINE WITHOUT CONTRAST TECHNIQUE: Multidetector CT imaging of the head and cervical spine was performed following the standard protocol without intravenous contrast. Multiplanar CT image reconstructions of the cervical spine were also generated. COMPARISON:  CT head 07/29/2020 FINDINGS: CT HEAD FINDINGS Brain: Patchy and confluent areas of decreased attenuation are noted throughout the deep and periventricular white matter of the cerebral hemispheres bilaterally, compatible with chronic microvascular ischemic disease. No evidence of large-territorial acute infarction. No parenchymal hemorrhage. No mass lesion. No extra-axial collection. No mass effect or midline shift. No hydrocephalus. Basilar cisterns are patent. Vascular: No hyperdense vessel. Skull: No acute fracture or focal lesion. Sinuses/Orbits: Paranasal sinuses and mastoid air cells are clear. The orbits are unremarkable. Other: None. CT CERVICAL SPINE FINDINGS Alignment: Unable to evaluate. Skull base and vertebrae: Markedly limited evaluation. Soft tissues and spinal canal: No prevertebral fluid or swelling. No visible canal hematoma. Upper chest: No large apical pneumothorax. Other: None. IMPRESSION: 1. No acute intracranial abnormality. 2. Nondiagnostic CT cervical spine. A fracture or traumatic listhesis cannot be excluded. Recommend repeat due to motion artifact. Electronically Signed   By: Iven Finn M.D.   On: 10/12/2020 00:12   DG Chest Port 1 View  Result Date: 10/12/2020 CLINICAL DATA:  Altered mental status. EXAM: PORTABLE  CHEST 1 VIEW COMPARISON:  CT chest 05/30/2020, chest x-ray 07/29/2020 FINDINGS: The heart size and mediastinal contours are unchanged. Aortic arch calcification. Biapical pleural/pulmonary scarring. Low lung volumes with bibasilar compressive changes. No focal consolidation. No pulmonary edema. Persistent trace bilateral pleural effusion. Elevated left hemidiaphragm again noted. No pneumothorax. No acute osseous abnormality.  Right upper abdomen stent noted. IMPRESSION: 1. Low lung volumes with persistent trace bilateral pleural effusions. 2. Similar-appearing enlarged cardiac silhouette. Electronically Signed   By: Iven Finn M.D.   On: 10/12/2020 00:19    ____________________________________________   PROCEDURES  Procedure(s) performed (including Critical Care):  .1-3 Lead EKG Interpretation Performed by: Paulette Blanch, MD Authorized by: Paulette Blanch, MD     Interpretation: abnormal     ECG rate:  55   ECG rate assessment: bradycardic     Rhythm: sinus bradycardia     Ectopy: none     Conduction: normal   Comments:     Patient placed on cardiac monitor to evaluate for arrhythmias    CRITICAL CARE Performed by: Paulette Blanch   Total critical care time: 45 minutes  Critical care time was exclusive of separately billable procedures and treating other patients.  Critical care was necessary to treat or prevent imminent or life-threatening deterioration.  Critical care was time spent personally by me on the following activities: development of treatment plan with patient and/or surrogate as well as nursing, discussions with consultants, evaluation of patient's response to treatment, examination of patient, obtaining history from patient or surrogate, ordering and performing treatments and interventions, ordering and review of laboratory studies, ordering and review of radiographic studies, pulse oximetry and re-evaluation of patient's  condition.  ____________________________________________   INITIAL IMPRESSION / ASSESSMENT AND PLAN / ED COURSE  As part of my medical decision making, I reviewed the following data within the [place] notes reviewed and incorporated, Labs reviewed, EKG interpreted, Old chart reviewed, Radiograph reviewed, Discussed with admitting physician and Notes from prior ED visits     60 year old female with alcoholic cirrhosis who presents with confusion. Differential diagnosis includes, but is not limited to, alcohol, illicit or prescription medications, or other toxic ingestion; intracranial  pathology such as stroke or intracerebral hemorrhage; fever or infectious causes including sepsis; hypoxemia and/or hypercarbia; uremia; trauma; endocrine related disorders such as diabetes, hypoglycemia, and thyroid-related diseases; hypertensive encephalopathy; etc.  Will obtain lab work, UA; CT head/cervical spine, chest x-ray.  Suspect patient will require Lactulose and hospitalization for hyperammonemia.  Clinical Course as of 10/12/20 8828  Sun Oct 12, 2020  0037 Repeat CT scan ordered per radiology recommendations given motion artifact on first scan.  Will give patient low-dose Ativan so she can hold still. [JS]  0126 Repeat CT cervical spine successful.  UA noted; will initiate IV Rocephin.  Awaiting results of metabolic panel.  Blood pressure has improved with fluids.  Patient resting in no acute distress. [JS]    Clinical Course User Index [JS] Paulette Blanch, MD     ____________________________________________   FINAL CLINICAL IMPRESSION(S) / ED DIAGNOSES  Final diagnoses:  Altered mental status, unspecified altered mental status type  Lower urinary tract infectious disease  Hyperammonemia ([place])  Hepatic encephalopathy (HCC)  AKI (acute kidney injury) [facility])     ED Discharge Orders    None      *Please note:  Deah Ottaway was evaluated in Emergency  Department on 10/12/2020 for the symptoms described in the history of present illness. She was evaluated in the context of the global COVID-19 pandemic, which necessitated consideration that the patient might be at risk for infection with the SARS-CoV-2 virus that causes COVID-19. Institutional protocols and algorithms that pertain to the evaluation of patients at risk for COVID-19 are in a state of rapid change based on information released by regulatory bodies including the CDC and federal and state organizations. These policies and algorithms were followed during the patient's care in the ED.  Some ED evaluations and interventions may be delayed as a result of limited staffing during and the pandemic.*   Note:  This document was prepared using Dragon voice recognition software and may include unintentional dictation errors.   Paulette Blanch, MD 10/12/20 0034    Paulette Blanch, MD 10/12/20 [phone]

## 2020-10-11 NOTE — ED Notes (Signed)
Pt to CT

## 2020-10-11 NOTE — ED Triage Notes (Signed)
Patient wheelchair to triage with complaints of increased confusion for the last 2-3 days worse in the last 24 hours  Pt husband reports that pt has had 2 admissions for increased ammonia d/t cirrhosis of the liver, "last time she was here she fell on to her face in the waiting room"  Pt spastic in wheelchair, keeps asking "did you see the birds?!?!"    2 attempts in triage for blood unsuccessful d/t movement

## 2020-10-12 ENCOUNTER — Encounter: Payer: Self-pay | Admitting: Radiology

## 2020-10-12 ENCOUNTER — Emergency Department: Payer: Medicare Other

## 2020-10-12 DIAGNOSIS — K219 Gastro-esophageal reflux disease without esophagitis: Secondary | ICD-10-CM | POA: Diagnosis present

## 2020-10-12 DIAGNOSIS — F17210 Nicotine dependence, cigarettes, uncomplicated: Secondary | ICD-10-CM | POA: Diagnosis present

## 2020-10-12 DIAGNOSIS — I959 Hypotension, unspecified: Secondary | ICD-10-CM | POA: Diagnosis not present

## 2020-10-12 DIAGNOSIS — J9601 Acute respiratory failure with hypoxia: Secondary | ICD-10-CM | POA: Diagnosis not present

## 2020-10-12 DIAGNOSIS — R296 Repeated falls: Secondary | ICD-10-CM | POA: Diagnosis present

## 2020-10-12 DIAGNOSIS — R1310 Dysphagia, unspecified: Secondary | ICD-10-CM | POA: Diagnosis present

## 2020-10-12 DIAGNOSIS — I951 Orthostatic hypotension: Secondary | ICD-10-CM | POA: Diagnosis present

## 2020-10-12 DIAGNOSIS — R001 Bradycardia, unspecified: Secondary | ICD-10-CM | POA: Diagnosis present

## 2020-10-12 DIAGNOSIS — F32A Depression, unspecified: Secondary | ICD-10-CM | POA: Diagnosis present

## 2020-10-12 DIAGNOSIS — E872 Acidosis: Secondary | ICD-10-CM | POA: Diagnosis present

## 2020-10-12 DIAGNOSIS — I5033 Acute on chronic diastolic (congestive) heart failure: Secondary | ICD-10-CM | POA: Diagnosis not present

## 2020-10-12 DIAGNOSIS — K7290 Hepatic failure, unspecified without coma: Secondary | ICD-10-CM | POA: Diagnosis present

## 2020-10-12 DIAGNOSIS — I110 Hypertensive heart disease with heart failure: Secondary | ICD-10-CM | POA: Diagnosis present

## 2020-10-12 DIAGNOSIS — N390 Urinary tract infection, site not specified: Secondary | ICD-10-CM

## 2020-10-12 DIAGNOSIS — I10 Essential (primary) hypertension: Secondary | ICD-10-CM

## 2020-10-12 DIAGNOSIS — Z515 Encounter for palliative care: Secondary | ICD-10-CM | POA: Diagnosis not present

## 2020-10-12 DIAGNOSIS — N179 Acute kidney failure, unspecified: Secondary | ICD-10-CM

## 2020-10-12 DIAGNOSIS — Z7189 Other specified counseling: Secondary | ICD-10-CM | POA: Diagnosis not present

## 2020-10-12 DIAGNOSIS — Z66 Do not resuscitate: Secondary | ICD-10-CM | POA: Diagnosis present

## 2020-10-12 DIAGNOSIS — K222 Esophageal obstruction: Secondary | ICD-10-CM | POA: Diagnosis present

## 2020-10-12 DIAGNOSIS — K7031 Alcoholic cirrhosis of liver with ascites: Secondary | ICD-10-CM | POA: Diagnosis present

## 2020-10-12 DIAGNOSIS — K767 Hepatorenal syndrome: Secondary | ICD-10-CM | POA: Diagnosis not present

## 2020-10-12 DIAGNOSIS — I480 Paroxysmal atrial fibrillation: Secondary | ICD-10-CM | POA: Diagnosis present

## 2020-10-12 DIAGNOSIS — K580 Irritable bowel syndrome with diarrhea: Secondary | ICD-10-CM | POA: Diagnosis present

## 2020-10-12 DIAGNOSIS — K7682 Hepatic encephalopathy: Secondary | ICD-10-CM | POA: Diagnosis present

## 2020-10-12 DIAGNOSIS — K766 Portal hypertension: Secondary | ICD-10-CM | POA: Diagnosis present

## 2020-10-12 DIAGNOSIS — Z20822 Contact with and (suspected) exposure to covid-19: Secondary | ICD-10-CM | POA: Diagnosis present

## 2020-10-12 DIAGNOSIS — J441 Chronic obstructive pulmonary disease with (acute) exacerbation: Secondary | ICD-10-CM | POA: Diagnosis not present

## 2020-10-12 LAB — URINALYSIS, COMPLETE (UACMP) WITH MICROSCOPIC
Bilirubin Urine: NEGATIVE
Glucose, UA: NEGATIVE mg/dL
Ketones, ur: 5 mg/dL — AB
Nitrite: NEGATIVE
Protein, ur: >=300 mg/dL — AB
Specific Gravity, Urine: 1.022 (ref 1.005–1.030)
pH: 5 (ref 5.0–8.0)

## 2020-10-12 LAB — COMPREHENSIVE METABOLIC PANEL
ALT: 43 U/L (ref 0–44)
ALT: 37 U/L (ref 0–44)
AST: 77 U/L — ABNORMAL HIGH (ref 15–41)
AST: 70 U/L — ABNORMAL HIGH (ref 15–41)
Albumin: 3.7 g/dL (ref 3.5–5.0)
Albumin: 3.2 g/dL — ABNORMAL LOW (ref 3.5–5.0)
Alkaline Phosphatase: 252 U/L — ABNORMAL HIGH (ref 38–126)
Alkaline Phosphatase: 228 U/L — ABNORMAL HIGH (ref 38–126)
Anion gap: 13 (ref 5–15)
Anion gap: 13 (ref 5–15)
BUN: 33 mg/dL — ABNORMAL HIGH (ref 6–20)
BUN: 30 mg/dL — ABNORMAL HIGH (ref 6–20)
CO2: 22 mmol/L (ref 22–32)
CO2: 18 mmol/L — ABNORMAL LOW (ref 22–32)
Calcium: 9.2 mg/dL (ref 8.9–10.3)
Calcium: 8.3 mg/dL — ABNORMAL LOW (ref 8.9–10.3)
Chloride: 106 mmol/L (ref 98–111)
Chloride: 108 mmol/L (ref 98–111)
Creatinine, Ser: 1.89 mg/dL — ABNORMAL HIGH (ref 0.44–1.00)
Creatinine, Ser: 1.84 mg/dL — ABNORMAL HIGH (ref 0.44–1.00)
GFR, Estimated: 30 mL/min — ABNORMAL LOW (ref >60)
GFR, Estimated: 31 mL/min — ABNORMAL LOW (ref >60)
Glucose, Bld: 102 mg/dL — ABNORMAL HIGH (ref 70–99)
Glucose, Bld: 96 mg/dL (ref 70–99)
Potassium: 4.5 mmol/L (ref 3.5–5.1)
Potassium: 4 mmol/L (ref 3.5–5.1)
Sodium: 141 mmol/L (ref 135–145)
Sodium: 139 mmol/L (ref 135–145)
Total Bilirubin: 3.3 mg/dL — ABNORMAL HIGH (ref 0.3–1.2)
Total Bilirubin: 2.7 mg/dL — ABNORMAL HIGH (ref 0.3–1.2)
Total Protein: 6.7 g/dL (ref 6.5–8.1)
Total Protein: 5.9 g/dL — ABNORMAL LOW (ref 6.5–8.1)

## 2020-10-12 LAB — CBC
HCT: 37.2 % (ref 36.0–46.0)
HCT: 37.9 % (ref 36.0–46.0)
Hemoglobin: 12.4 g/dL (ref 12.0–15.0)
Hemoglobin: 12.7 g/dL (ref 12.0–15.0)
MCH: 32.6 pg (ref 26.0–34.0)
MCH: 32.8 pg (ref 26.0–34.0)
MCHC: 33.3 g/dL (ref 30.0–36.0)
MCHC: 33.5 g/dL (ref 30.0–36.0)
MCV: 97.9 fL (ref 80.0–100.0)
MCV: 97.9 fL (ref 80.0–100.0)
Platelets: 189 10*3/uL (ref 150–400)
Platelets: 159 10*3/uL (ref 150–400)
RBC: 3.8 MIL/uL — ABNORMAL LOW (ref 3.87–5.11)
RBC: 3.87 MIL/uL (ref 3.87–5.11)
RDW: 16.3 % — ABNORMAL HIGH (ref 11.5–15.5)
RDW: 16.5 % — ABNORMAL HIGH (ref 11.5–15.5)
WBC: 10.8 10*3/uL — ABNORMAL HIGH (ref 4.0–10.5)
WBC: 11.8 10*3/uL — ABNORMAL HIGH (ref 4.0–10.5)
nRBC: 0 % (ref 0.0–0.2)
nRBC: 0 % (ref 0.0–0.2)

## 2020-10-12 LAB — CBG MONITORING, ED: Glucose-Capillary: 99 mg/dL (ref 70–99)

## 2020-10-12 LAB — TROPONIN I (HIGH SENSITIVITY)
Troponin I (High Sensitivity): 44 ng/L — ABNORMAL HIGH (ref <18)
Troponin I (High Sensitivity): 46 ng/L — ABNORMAL HIGH (ref <18)

## 2020-10-12 LAB — RESP PANEL BY RT-PCR (FLU A&B, COVID) ARPGX2
Influenza A by PCR: NEGATIVE
Influenza B by PCR: NEGATIVE
SARS Coronavirus 2 by RT PCR: NEGATIVE

## 2020-10-12 LAB — LACTIC ACID, PLASMA
Lactic Acid, Venous: 2.2 mmol/L (ref 0.5–1.9)
Lactic Acid, Venous: 2.5 mmol/L (ref 0.5–1.9)

## 2020-10-12 LAB — AMMONIA
Ammonia: 78 umol/L — ABNORMAL HIGH (ref 9–35)
Ammonia: 96 umol/L — ABNORMAL HIGH (ref 9–35)

## 2020-10-12 LAB — ETHANOL: Alcohol, Ethyl (B): <10 mg/dL (ref <10)

## 2020-10-12 LAB — LIPASE, BLOOD: Lipase: 24 U/L (ref 11–51)

## 2020-10-12 IMAGING — CT CT CERVICAL SPINE W/O CM
4 of 8 series · 12 of 33 positions shown, 13 images · non-contrast
Comparison: CT cervical spine [DATE]

CLINICAL DATA: Neck trauma, intoxicated or obtunded

EXAM:
CT CERVICAL SPINE WITHOUT CONTRAST
TECHNIQUE: Multidetector CT imaging of the cervical spine was performed without
intravenous contrast. Multiplanar CT image reconstructions were also
generated.

[Series 6: orthogonal bone · axial · 0.25mm/px · z∈[-225,-160]mm · 2 of 110 slices shown, 3 images (1 of 2)]
[im 37/110  soft-tissue]
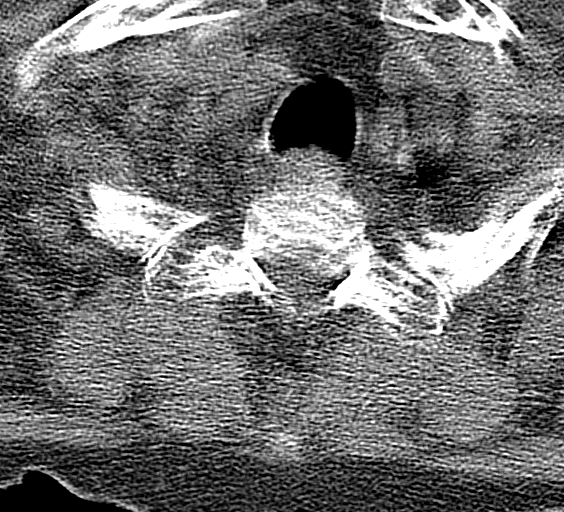
[im 37/110  bone]
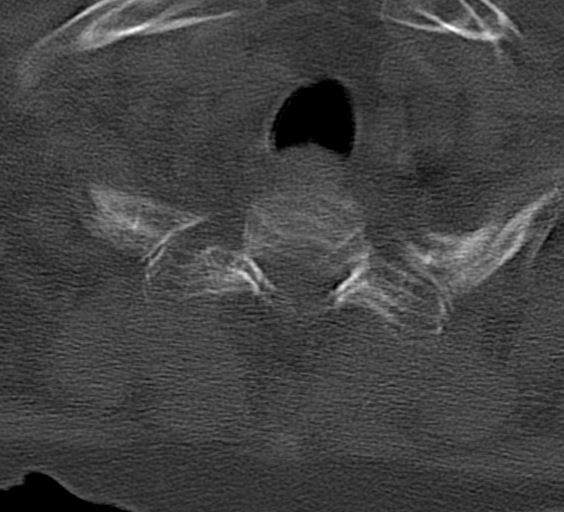
[im 73/110  bone]
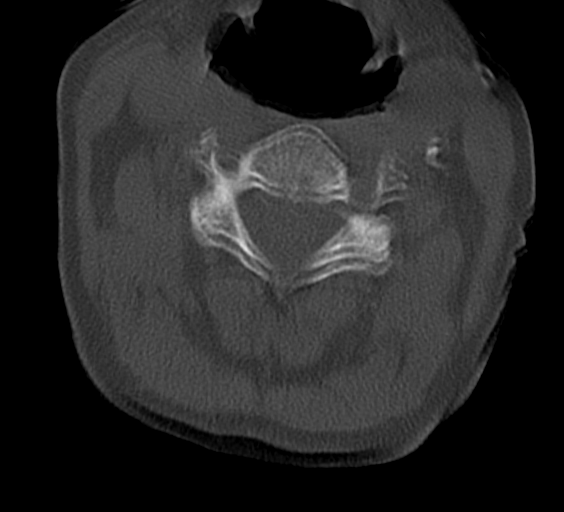

[Series 8: coronal bone · coronal · 0.27mm/px · 3 of 77 slices shown]
[im 4/77  bone]
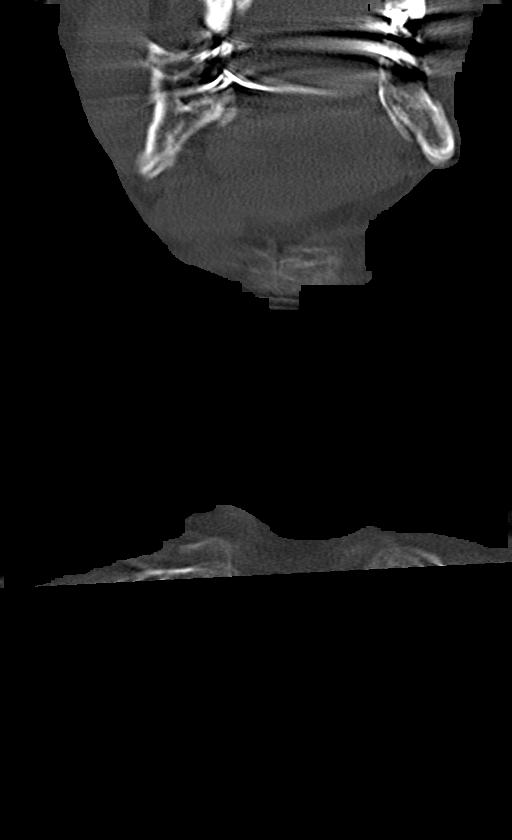
[im 31/77  bone]
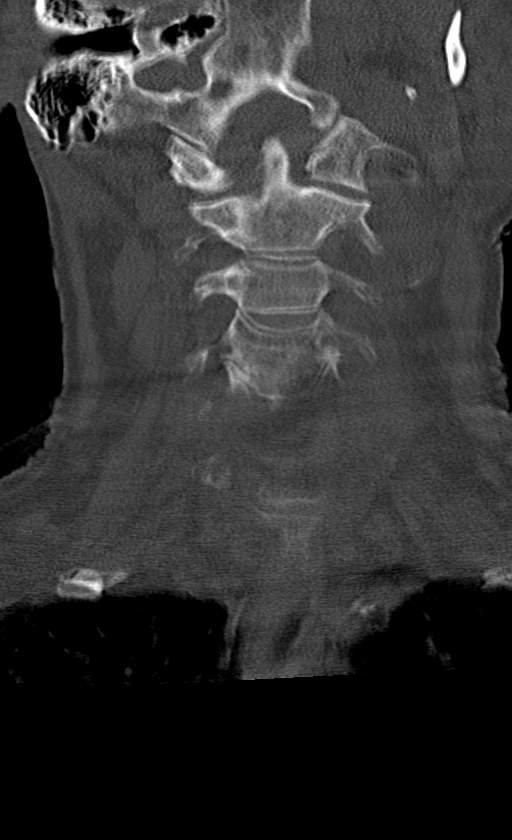
[im 58/77  bone]
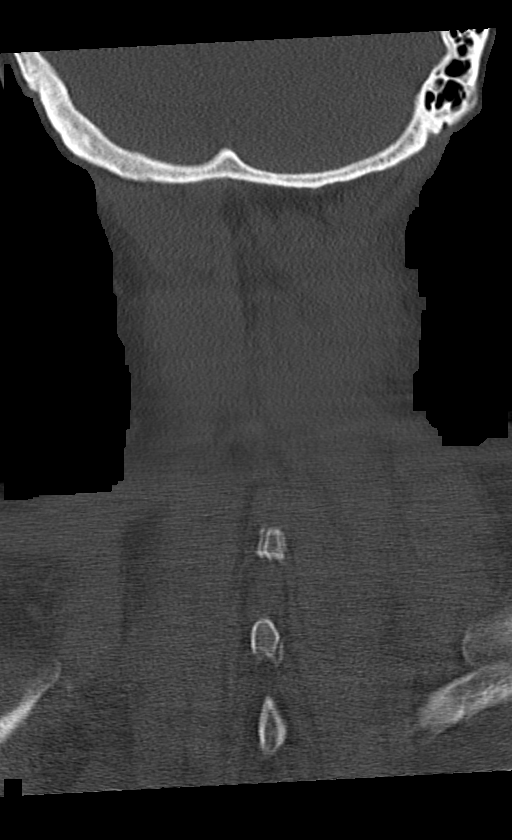

[Series 13: orthogonal bone · axial · 0.28mm/px · z∈[-240,-182]mm · 2 of 103 slices shown (2 of 2)]
[im 35/103  bone]
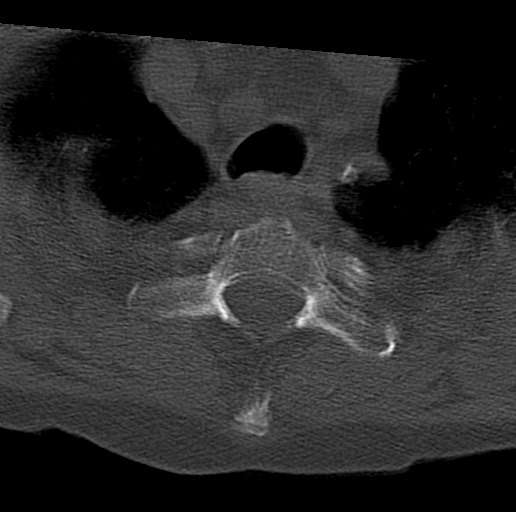
[im 69/103  bone]
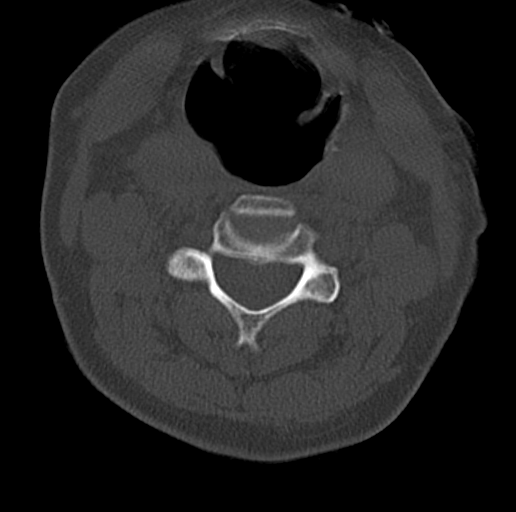

[Series 14: sagittal bone · sagittal · 0.31mm/px · 5 of 70 slices shown]
[im 12/70  bone]
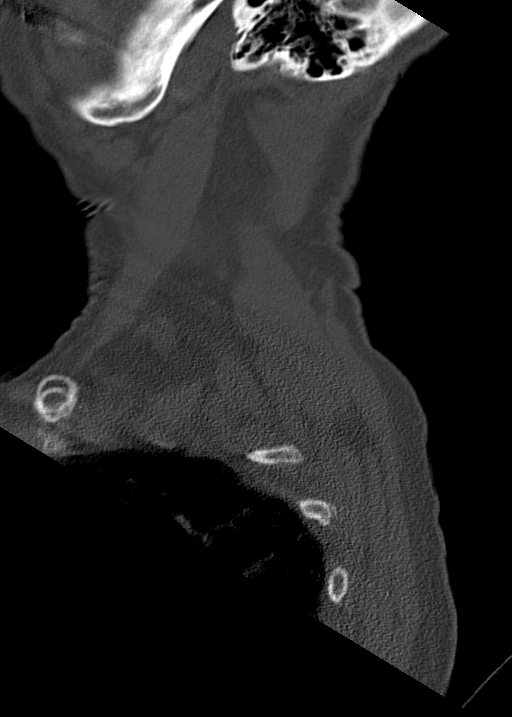
[im 24/70  bone]
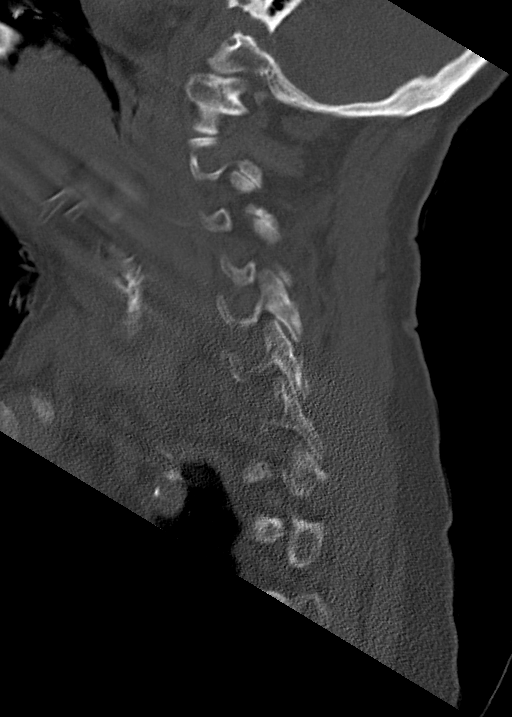
[im 35/70  bone]
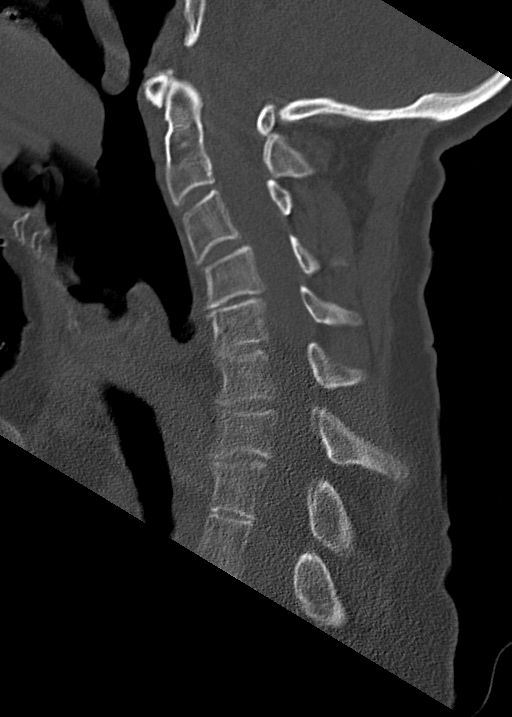
[im 47/70  bone]
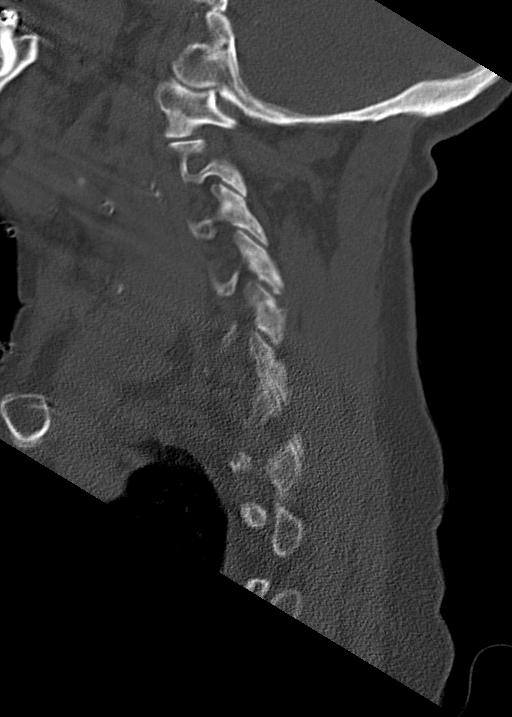
[im 58/70  bone]
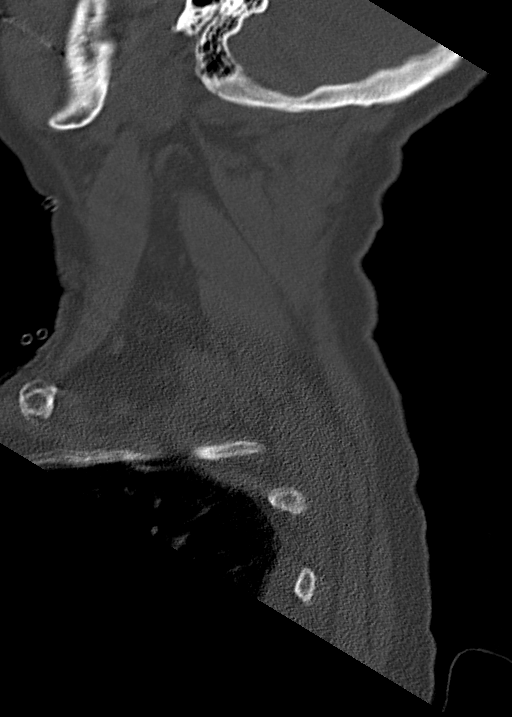

[12 of 33 positions shown; findings below may reference images not displayed]

FINDINGS: Limited evaluation due to motion artifact.

Alignment: Reversal of the normal cervical lordosis likely due to
positioning.

Skull base and vertebrae: Multilevel mild degenerative changes of
the spine. Lucency through the C5 vertebral body on sagittal view
not visualized on coronal axial images and likely artifactual due to
motion. No acute fracture. No aggressive appearing focal osseous
lesion or focal pathologic process.

Soft tissues and spinal canal: No prevertebral fluid or swelling. No
visible canal hematoma.

Upper chest: Unremarkable.

Other: None.
IMPRESSION: No definite acute displaced fracture or traumatic listhesis of the
cervical spine with slightly limited evaluation due to motion
artifact. Lucency through the C5 vertebral body on sagittal view not
visualized on coronal axial images and likely artifactual due to
motion.

## 2020-10-12 MED ORDER — POTASSIUM CHLORIDE ER 10 MEQ PO TBCR
10.0000 meq | EXTENDED_RELEASE_TABLET | Freq: Every day | ORAL | Status: DC
  Filled 2020-10-12 (×2): qty 1

## 2020-10-12 MED ORDER — DIPHENHYDRAMINE HCL 50 MG/ML IJ SOLN
12.5000 mg | Freq: Every evening | INTRAMUSCULAR | Status: DC | PRN
  Administered 2020-10-13: 12.5 mg via INTRAVENOUS
  Filled 2020-10-12: qty 1

## 2020-10-12 MED ORDER — SODIUM CHLORIDE 0.9 % IV SOLN
1.0000 g | INTRAVENOUS | Status: DC
  Administered 2020-10-13: 1 g via INTRAVENOUS
  Filled 2020-10-12 (×2): qty 10

## 2020-10-12 MED ORDER — GABAPENTIN 300 MG PO CAPS
400.0000 mg | ORAL_CAPSULE | Freq: Two times a day (BID) | ORAL | Status: DC
  Administered 2020-10-12: 400 mg via ORAL
  Filled 2020-10-12: qty 1

## 2020-10-12 MED ORDER — DIGOXIN 125 MCG PO TABS
125.0000 ug | ORAL_TABLET | Freq: Every day | ORAL | Status: DC
  Administered 2020-10-12: 125 ug via ORAL
  Filled 2020-10-12 (×2): qty 1

## 2020-10-12 MED ORDER — SODIUM CHLORIDE 0.9 % IV SOLN
1.0000 g | Freq: Once | INTRAVENOUS | Status: AC
  Administered 2020-10-12: 1 g via INTRAVENOUS
  Filled 2020-10-12: qty 10

## 2020-10-12 MED ORDER — GABAPENTIN 300 MG PO CAPS: 800.0000 mg | ORAL_CAPSULE | Freq: Every day | ORAL | Status: DC

## 2020-10-12 MED ORDER — ONDANSETRON HCL 4 MG/2ML IJ SOLN: 4.0000 mg | Freq: Four times a day (QID) | INTRAMUSCULAR | Status: DC | PRN

## 2020-10-12 MED ORDER — LACTATED RINGERS IV BOLUS
1000.0000 mL | Freq: Once | INTRAVENOUS | Status: AC
  Administered 2020-10-12: 1000 mL via INTRAVENOUS

## 2020-10-12 MED ORDER — LACTULOSE ENEMA
300.0000 mL | Freq: Once | ORAL | Status: DC
  Filled 2020-10-12: qty 300

## 2020-10-12 MED ORDER — LACTULOSE 10 GM/15ML PO SOLN
30.0000 g | Freq: Once | ORAL | Status: AC
  Administered 2020-10-12: 30 g via ORAL
  Filled 2020-10-12: qty 60

## 2020-10-12 MED ORDER — LORAZEPAM 2 MG/ML IJ SOLN
0.5000 mg | Freq: Once | INTRAMUSCULAR | Status: AC
  Administered 2020-10-12: 0.5 mg via INTRAVENOUS
  Filled 2020-10-12: qty 1

## 2020-10-12 MED ORDER — TRAZODONE HCL 50 MG PO TABS: 25.0000 mg | ORAL_TABLET | Freq: Every evening | ORAL | Status: DC | PRN

## 2020-10-12 MED ORDER — RIFAXIMIN 550 MG PO TABS
550.0000 mg | ORAL_TABLET | Freq: Two times a day (BID) | ORAL | Status: DC
  Administered 2020-10-12: 550 mg via ORAL
  Filled 2020-10-12 (×3): qty 1

## 2020-10-12 MED ORDER — GABAPENTIN 300 MG PO CAPS: 400.0000 mg | ORAL_CAPSULE | ORAL | Status: DC

## 2020-10-12 MED ORDER — MAGNESIUM HYDROXIDE 400 MG/5ML PO SUSP: 30.0000 mL | Freq: Every day | ORAL | Status: DC | PRN

## 2020-10-12 MED ORDER — FUROSEMIDE 40 MG PO TABS
40.0000 mg | ORAL_TABLET | Freq: Every day | ORAL | Status: DC
  Administered 2020-10-12: 40 mg via ORAL
  Filled 2020-10-12: qty 1

## 2020-10-12 MED ORDER — IPRATROPIUM-ALBUTEROL 0.5-2.5 (3) MG/3ML IN SOLN
3.0000 mL | RESPIRATORY_TRACT | Status: DC | PRN
  Administered 2020-10-13: 3 mL via RESPIRATORY_TRACT
  Filled 2020-10-12 (×2): qty 3

## 2020-10-12 MED ORDER — MIDODRINE HCL 5 MG PO TABS
10.0000 mg | ORAL_TABLET | Freq: Three times a day (TID) | ORAL | Status: DC
  Administered 2020-10-12: 10 mg via ORAL
  Filled 2020-10-12 (×5): qty 2

## 2020-10-12 MED ORDER — ONDANSETRON HCL 4 MG PO TABS: 4.0000 mg | ORAL_TABLET | Freq: Four times a day (QID) | ORAL | Status: DC | PRN

## 2020-10-12 MED ORDER — TIZANIDINE HCL 4 MG PO TABS
4.0000 mg | ORAL_TABLET | Freq: Three times a day (TID) | ORAL | Status: DC | PRN
  Administered 2020-10-12: 8 mg via ORAL
  Filled 2020-10-12 (×2): qty 2

## 2020-10-12 MED ORDER — LACTULOSE 10 GM/15ML PO SOLN
30.0000 g | Freq: Three times a day (TID) | ORAL | Status: DC
  Administered 2020-10-12: 30 g via ORAL
  Filled 2020-10-12: qty 60

## 2020-10-12 MED ORDER — ACETAMINOPHEN 325 MG PO TABS
650.0000 mg | ORAL_TABLET | Freq: Four times a day (QID) | ORAL | Status: DC | PRN
  Filled 2020-10-12: qty 2

## 2020-10-12 MED ORDER — PANTOPRAZOLE SODIUM 20 MG PO TBEC
20.0000 mg | DELAYED_RELEASE_TABLET | ORAL | Status: DC
  Administered 2020-10-12: 20 mg via ORAL
  Filled 2020-10-12 (×3): qty 1

## 2020-10-12 MED ORDER — MELATONIN 5 MG PO TABS: 5.0000 mg | ORAL_TABLET | Freq: Every evening | ORAL | Status: DC | PRN

## 2020-10-12 MED ORDER — ENOXAPARIN SODIUM 40 MG/0.4ML ~~LOC~~ SOLN
40.0000 mg | SUBCUTANEOUS | Status: DC
  Administered 2020-10-12: 40 mg via SUBCUTANEOUS
  Filled 2020-10-12: qty 0.4

## 2020-10-12 MED ORDER — METOPROLOL SUCCINATE ER 25 MG PO TB24
25.0000 mg | ORAL_TABLET | Freq: Every day | ORAL | Status: DC
  Administered 2020-10-12: 25 mg via ORAL
  Filled 2020-10-12: qty 1

## 2020-10-12 MED ORDER — SODIUM CHLORIDE 0.9 % IV SOLN: INTRAVENOUS | Status: DC

## 2020-10-12 MED ORDER — ACETAMINOPHEN 650 MG RE SUPP: 650.0000 mg | Freq: Four times a day (QID) | RECTAL | Status: DC | PRN

## 2020-10-12 NOTE — ED Notes (Signed)
MD Sharen Hones made aware patient chokes/coughs after food/drink/medication. MD ordered speech exam

## 2020-10-12 NOTE — Progress Notes (Signed)
PROGRESS NOTE    Anita Reed  VCB:[id number] DOB: 1961-02-14 DOA: 10/19/2020 PCP: Towanda Malkin, MD (Inactive)   Chief complaint.  Altered mental status. Brief Narrative:  Anita Reed is a 60 y.o. Caucasian female with medical history significant for hypertension, atrial flutter, alcoholic liver cirrhosis and hepatic encephalopathy on lactulose and resolved hepatitis C, who presented to the emergency room with acute onset of altered mental status with confusion over the last couple of days. The patient has not been taking her lactulose over the last couple weeks. Patient had elevated ammonia at 78, she is diagnosed with hepatic encephalopathy.  She was given fluids as well as lactulose.   Assessment & Plan:   Active Problems:   Hepatic encephalopathy ([place])  #1.  Hepatic encephalopathy. Alcoholic liver cirrhosis. Dysphagia. Hypotension. Patient condition is consistent with hepatic encephalopathy, she has not been taking her lactulose, she is confused, she has elevated ammonia level. She also has dysphagia with choking, obtain speech therapy evaluation.  This could be secondary to altered mental status. Patient developed hypotension this morning, was giving 1 L of lactated Ringer.  Blood pressure is better. I will continue IV fluids, continue lactulose.  Also added rifaximin. Patient had a history of C. difficile colitis, she has some diarrhea, she has not been taking her lactulose.  Will check C. difficile toxin again. Patient appear sick, prognosis is guarded. Patient was also placed on 2 L oxygen with some bronchospasm.  Start DuoNeb.  Chest x-ray showed a bilateral pleural effusion, no infiltrates or consolidation.  I do not suspect pneumonia.  Procalcitonin level will be checked.  2.  Acute kidney injury. Likely secondary to dehydration.  Also possible of hepatorenal syndrome. Continue fluids.  3.  UTI. Continue Rocephin, pending culture results.  4.  Paroxysmal  atrial fibrillation. Patient currently on digoxin and beta-blocker.  Will continue.  5.  Essential hypertension. Patient is on beta-blocker, will continue.   DVT prophylaxis: Lovenox Code Status: Full Family Communication:  Disposition Plan:  .   Status is: Inpatient  Remains inpatient appropriate because:Inpatient level of care appropriate due to severity of illness   Dispo: The patient is from: Home              Anticipated d/c is to: Home              Anticipated d/c date is: 2 days              Patient currently is not medically stable to d/c.   Difficult to place patient No        I/O last 3 completed shifts: In: 1100 [IV Piggyback:1100] Out: -  No intake/output data recorded.     Consultants:   None  Procedures: None  Antimicrobials: Rocephin  Subjective: Patient is still very confused, no agitation.  She has significant dysphagia, choking when she was fed. She is on 2 L oxygen with some wheezing.  Cough, nonproductive. No abdominal pain or nausea vomiting. No fever or chills.  Objective: Vitals:   10/12/20 1059 10/12/20 1100 10/12/20 1130 10/12/20 1330  BP: 119/63 119/63 108/76 (!) 106/55  Pulse: 65     Resp:   17 15  Temp:      TempSrc:      SpO2:      Weight:      Height:        Intake/Output Summary (Last 24 hours) at 10/12/2020 1418 Last data filed at 10/12/2020 0344 Gross per 24 hour  Intake  1100 ml  Output --  Net 1100 ml   Filed Weights   10/12/2020 2251  Weight: 72.6 kg    Examination:  General exam: Appears calm and comfortable  Respiratory system: Decreased breathing sounds. Respiratory effort normal. Cardiovascular system: S1 & S2 heard, RRR. No JVD, murmurs, rubs, gallops or clicks. No pedal edema. Gastrointestinal system: Abdomen is nondistended, soft and nontender. No organomegaly or masses felt. Normal bowel sounds heard. Central nervous system: Alert and oriented x2. No focal neurological deficits. Extremities:  Symmetric. Skin: Skin looks yellow with jaundice.     Data Reviewed: I have personally reviewed following labs and imaging studies  CBC: Recent Labs  Lab 10/19/2020 2338 10/12/20 0624  WBC 10.8* 11.8*  HGB 12.4 12.7  HCT 37.2 37.9  MCV 97.9 97.9  PLT 189 846   Basic Metabolic Panel: Recent Labs  Lab 10/12/20 0225 10/12/20 0624  NA 141 139  K 4.5 4.0  CL 106 108  CO2 22 18*  GLUCOSE 102* 96  BUN 33* 30*  CREATININE 1.89* 1.84*  CALCIUM 9.2 8.3*   GFR: Estimated Creatinine Clearance: 33.6 mL/min (A) (by C-G formula based on SCr of 1.84 mg/dL (H)). Liver Function Tests: Recent Labs  Lab 10/12/20 0225 10/12/20 0624  AST 77* 70*  ALT 43 37  ALKPHOS 252* 228*  BILITOT 3.3* 2.7*  PROT 6.7 5.9*  ALBUMIN 3.7 3.2*   Recent Labs  Lab 10/10/2020 2338  LIPASE 24   Recent Labs  Lab 10/22/2020 2338 10/12/20 0624  AMMONIA 78* 96*   Coagulation Profile: No results for input(s): INR, PROTIME in the last 168 hours. Cardiac Enzymes: No results for input(s): CKTOTAL, CKMB, CKMBINDEX, TROPONINI in the last 168 hours. BNP (last 3 results) No results for input(s): PROBNP in the last 8760 hours. HbA1C: No results for input(s): HGBA1C in the last 72 hours. CBG: Recent Labs  Lab 10/12/20 0057  GLUCAP 99   Lipid Profile: No results for input(s): CHOL, HDL, LDLCALC, TRIG, CHOLHDL, LDLDIRECT in the last 72 hours. Thyroid Function Tests: No results for input(s): TSH, T4TOTAL, FREET4, T3FREE, THYROIDAB in the last 72 hours. Anemia Panel: No results for input(s): VITAMINB12, FOLATE, FERRITIN, TIBC, IRON, RETICCTPCT in the last 72 hours. Sepsis Labs: Recent Labs  Lab 10/17/2020 2338 10/12/20 0225  LATICACIDVEN 2.2* 2.5*    Recent Results (from the past 240 hour(s))  Urine Culture     Status: None   Collection Time: 10/02/20  3:42 PM  Result Value Ref Range Status   MICRO NUMBER: [id number]  Final   SPECIMEN QUALITY: Adequate  Final   Sample Source URINE  Final    STATUS: FINAL  Final   ISOLATE 1:   Final    Mixed genital flora isolated. These superficial bacteria are not indicative of a urinary tract infection. No further organism identification is warranted on this specimen. If clinically indicated, recollect clean-catch, mid-stream urine and transfer  immediately to Urine Culture Transport Tube.   Culture, blood (routine x 2)     Status: None (Preliminary result)   Collection Time: 10/20/2020 11:17 PM   Specimen: BLOOD  Result Value Ref Range Status   Specimen Description BLOOD BLOOD LEFT WRIST  Final   Special Requests   Final    BOTTLES DRAWN AEROBIC AND ANAEROBIC Blood Culture results may not be optimal due to an inadequate volume of blood received in culture bottles   Culture   Final    NO GROWTH < 12 HOURS Performed at [facility]  Lab, [address], [place], [place] [address]    Report Status PENDING  Incomplete  Resp Panel by RT-PCR (Flu A&B, Covid)     Status: None   Collection Time: 10/12/20 12:36 AM   Specimen: Nasopharyngeal(NP) swabs in vial transport medium  Result Value Ref Range Status   SARS Coronavirus 2 by RT PCR NEGATIVE NEGATIVE Final    Comment: (NOTE) SARS-CoV-2 target nucleic acids are NOT DETECTED.  The SARS-CoV-2 RNA is generally detectable in upper respiratory specimens during the acute phase of infection. The lowest concentration of SARS-CoV-2 viral copies this assay can detect is 138 copies/mL. A negative result does not preclude SARS-Cov-2 infection and should not be used as the sole basis for treatment or other patient management decisions. A negative result may occur with  improper specimen collection/handling, submission of specimen other than nasopharyngeal swab, presence of viral mutation(s) within the areas targeted by this assay, and inadequate number of viral copies(<138 copies/mL). A negative result must be combined with clinical observations, patient history, and epidemiological information.  The expected result is Negative.  Fact Sheet for Patients:  EntrepreneurPulse.com.au  Fact Sheet for Healthcare Providers:  IncredibleEmployment.be  This test is no t yet approved or cleared by the [place] FDA and  has been authorized for detection and/or diagnosis of SARS-CoV-2 by FDA under an Emergency Use Authorization (EUA). This EUA will remain  in effect (meaning this test can be used) for the duration of the COVID-19 declaration under Section 564(b)(1) of the Act, 21 U.S.C.section 360bbb-3(b)(1), unless the authorization is terminated  or revoked sooner.       Influenza A by PCR NEGATIVE NEGATIVE Final   Influenza B by PCR NEGATIVE NEGATIVE Final    Comment: (NOTE) The Xpert Xpress SARS-CoV-2/FLU/RSV plus assay is intended as an aid in the diagnosis of influenza from Nasopharyngeal swab specimens and should not be used as a sole basis for treatment. Nasal washings and aspirates are unacceptable for Xpert Xpress SARS-CoV-2/FLU/RSV testing.  Fact Sheet for Patients: EntrepreneurPulse.com.au  Fact Sheet for Healthcare Providers: IncredibleEmployment.be  This test is not yet approved or cleared by the [place] FDA and has been authorized for detection and/or diagnosis of SARS-CoV-2 by FDA under an Emergency Use Authorization (EUA). This EUA will remain in effect (meaning this test can be used) for the duration of the COVID-19 declaration under Section 564(b)(1) of the Act, 21 U.S.C. section 360bbb-3(b)(1), unless the authorization is terminated or revoked.  Performed at [facility], [address] [place], [place] [address]   Culture, blood (routine x 2)     Status: None (Preliminary result)   Collection Time: 10/12/20  2:25 AM   Specimen: BLOOD  Result Value Ref Range Status   Specimen Description BLOOD BLOOD RIGHT HAND  Final   Special Requests   Final    BOTTLES DRAWN  AEROBIC ONLY Blood Culture adequate volume   Culture   Final    NO GROWTH < 12 HOURS Performed at [facility], [address] [place], [place] [address]    Report Status PENDING  Incomplete         Radiology Studies: CT Head Wo Contrast  Result Date: 10/12/2020 CLINICAL DATA:  Patient wheelchair to triage with complaints of increased confusion for the last 2-3 days worse in the last 24 hours. Pt husband reports that pt has had 2 admissions for increased ammonia d/t cirrhosis of the liver EXAM: CT HEAD WITHOUT CONTRAST CT CERVICAL SPINE WITHOUT CONTRAST TECHNIQUE:  Multidetector CT imaging of the head and cervical spine was performed following the standard protocol without intravenous contrast. Multiplanar CT image reconstructions of the cervical spine were also generated. COMPARISON:  CT head 07/29/2020 FINDINGS: CT HEAD FINDINGS Brain: Patchy and confluent areas of decreased attenuation are noted throughout the deep and periventricular white matter of the cerebral hemispheres bilaterally, compatible with chronic microvascular ischemic disease. No evidence of large-territorial acute infarction. No parenchymal hemorrhage. No mass lesion. No extra-axial collection. No mass effect or midline shift. No hydrocephalus. Basilar cisterns are patent. Vascular: No hyperdense vessel. Skull: No acute fracture or focal lesion. Sinuses/Orbits: Paranasal sinuses and mastoid air cells are clear. The orbits are unremarkable. Other: None. CT CERVICAL SPINE FINDINGS Alignment: Unable to evaluate. Skull base and vertebrae: Markedly limited evaluation. Soft tissues and spinal canal: No prevertebral fluid or swelling. No visible canal hematoma. Upper chest: No large apical pneumothorax. Other: None. IMPRESSION: 1. No acute intracranial abnormality. 2. Nondiagnostic CT cervical spine. A fracture or traumatic listhesis cannot be excluded. Recommend repeat due to motion artifact. Electronically Signed   By: Iven Finn M.D.   On: 10/12/2020 00:12   CT Cervical Spine Wo Contrast  Result Date: 10/12/2020 CLINICAL DATA:  Neck trauma, intoxicated or obtunded EXAM: CT CERVICAL SPINE WITHOUT CONTRAST TECHNIQUE: Multidetector CT imaging of the cervical spine was performed without intravenous contrast. Multiplanar CT image reconstructions were also generated. COMPARISON:  CT cervical spine 10/12/2020 FINDINGS: Limited evaluation due to motion artifact. Alignment: Reversal of the normal cervical lordosis likely due to positioning. Skull base and vertebrae: Multilevel mild degenerative changes of the spine. Lucency through the C5 vertebral body on sagittal view not visualized on coronal axial images and likely artifactual due to motion. No acute fracture. No aggressive appearing focal osseous lesion or focal pathologic process. Soft tissues and spinal canal: No prevertebral fluid or swelling. No visible canal hematoma. Upper chest: Unremarkable. Other: None. IMPRESSION: No definite acute displaced fracture or traumatic listhesis of the cervical spine with slightly limited evaluation due to motion artifact. Lucency through the C5 vertebral body on sagittal view not visualized on coronal axial images and likely artifactual due to motion. Electronically Signed   By: Iven Finn M.D.   On: 10/12/2020 01:20   CT Cervical Spine Wo Contrast  Result Date: 10/12/2020 CLINICAL DATA:  Patient wheelchair to triage with complaints of increased confusion for the last 2-3 days worse in the last 24 hours. Pt husband reports that pt has had 2 admissions for increased ammonia d/t cirrhosis of the liver EXAM: CT HEAD WITHOUT CONTRAST CT CERVICAL SPINE WITHOUT CONTRAST TECHNIQUE: Multidetector CT imaging of the head and cervical spine was performed following the standard protocol without intravenous contrast. Multiplanar CT image reconstructions of the cervical spine were also generated. COMPARISON:  CT head 07/29/2020 FINDINGS: CT HEAD  FINDINGS Brain: Patchy and confluent areas of decreased attenuation are noted throughout the deep and periventricular white matter of the cerebral hemispheres bilaterally, compatible with chronic microvascular ischemic disease. No evidence of large-territorial acute infarction. No parenchymal hemorrhage. No mass lesion. No extra-axial collection. No mass effect or midline shift. No hydrocephalus. Basilar cisterns are patent. Vascular: No hyperdense vessel. Skull: No acute fracture or focal lesion. Sinuses/Orbits: Paranasal sinuses and mastoid air cells are clear. The orbits are unremarkable. Other: None. CT CERVICAL SPINE FINDINGS Alignment: Unable to evaluate. Skull base and vertebrae: Markedly limited evaluation. Soft tissues and spinal canal: No prevertebral fluid or swelling. No visible canal hematoma. Upper chest: No large  apical pneumothorax. Other: None. IMPRESSION: 1. No acute intracranial abnormality. 2. Nondiagnostic CT cervical spine. A fracture or traumatic listhesis cannot be excluded. Recommend repeat due to motion artifact. Electronically Signed   By: Iven Finn M.D.   On: 10/12/2020 00:12   DG Chest Port 1 View  Result Date: 10/12/2020 CLINICAL DATA:  Altered mental status. EXAM: PORTABLE CHEST 1 VIEW COMPARISON:  CT chest 05/30/2020, chest x-ray 07/29/2020 FINDINGS: The heart size and mediastinal contours are unchanged. Aortic arch calcification. Biapical pleural/pulmonary scarring. Low lung volumes with bibasilar compressive changes. No focal consolidation. No pulmonary edema. Persistent trace bilateral pleural effusion. Elevated left hemidiaphragm again noted. No pneumothorax. No acute osseous abnormality.  Right upper abdomen stent noted. IMPRESSION: 1. Low lung volumes with persistent trace bilateral pleural effusions. 2. Similar-appearing enlarged cardiac silhouette. Electronically Signed   By: Iven Finn M.D.   On: 10/12/2020 00:19        Scheduled Meds: . digoxin  125  mcg Oral Daily  . enoxaparin (LOVENOX) injection  40 mg Subcutaneous Q24H  . furosemide  40 mg Oral Daily  . gabapentin  400 mg Oral BID   And  . gabapentin  800 mg Oral Q1400  . lactulose  30 g Oral TID  . metoprolol succinate  25 mg Oral Daily  . midodrine  10 mg Oral TID WC  . pantoprazole  20 mg Oral BH-q7a  . potassium chloride  10 mEq Oral Daily  . rifaximin  550 mg Oral BID   Continuous Infusions: . sodium chloride 50 mL/hr at 10/12/20 0622  . [START ON 11/04/2020] cefTRIAXone (ROCEPHIN)  IV       LOS: 0 days    Time spent: 28 minutes    Sharen Hones, MD [facility]   To contact the attending provider between 7A-7P or the covering provider during after hours 7P-7A, please log into the web site [email or web address] and access using universal [place] password for that web site. If you do not have the password, please call the hospital operator.  10/12/2020, 2:18 PM

## 2020-10-12 NOTE — ED Notes (Signed)
Pt ambulated to toilet with RN standby.

## 2020-10-12 NOTE — ED Notes (Signed)
Pt unable to sit still and moving around in bed. Pt A&O x4 but confused. Will continue to monitor.

## 2020-10-12 NOTE — ED Notes (Signed)
Pt still unable to sit still and confused. No changes in previous assessment. Awaiting further orders. Will conitnue to monitor.

## 2020-10-12 NOTE — ED Notes (Signed)
Patient had BM. Patient cleaned up and brief changed. Patient given new, warm blankets.

## 2020-10-12 NOTE — ED Notes (Signed)
Pt to CT. Lab asked if they have anyone that can come draw additional labs. RN unable to draw after multiple attempts.

## 2020-10-12 NOTE — H&P (Addendum)
[place]   PATIENT NAME: Anita Reed    MR#:  [id number]  DATE OF BIRTH:  11/28/1960  DATE OF ADMISSION:  10/16/2020  PRIMARY CARE PHYSICIAN: Towanda Malkin, MD (Inactive)   Patient is coming from: Home  REQUESTING/REFERRING PHYSICIAN: Lurline Hare, MD  CHIEF COMPLAINT:   Chief Complaint  Patient presents with  . Altered Mental Status    HISTORY OF PRESENT ILLNESS:  Verble Styron is a 60 y.o. Caucasian female with medical history significant for hypertension, atrial flutter, alcoholic liver cirrhosis and hepatic encephalopathy on lactulose and resolved hepatitis C, who presented to the emergency room with acute onset of altered mental status with confusion over the last couple of days. The patient has not been taking her lactulose over the last couple weeks. She stated that it has been inconvenient for her to take it while she is out at home. She had recurrent falls over the last 24 hours with no head injury. No hip pain or other arthralgias. No chest pain or dyspnea or cough or wheezing. No chest pain or palpitations. No nausea or vomiting or abdominal pain. Her last bowel movements was yesterday. ED Course: Blood pressure was 84/46 with a heart rate of 54 respiratory to 22 with otherwise normal vital signs. Later blood pressure was 115/99. Pulse symmetry was 98% on 2 L of O2 by nasal cannula. Labs revealed WBC of 10.8 and platelets of 189. Lactic acid was 2.2 and high-sensitivity troponin I was 44. Ammonia level was 78 and lipase was 24. CMP is currently pending.  UA was strongly positive for UTI.  Urine and blood cultures were drawn. EKG as reviewed by me :Showed sinus bradycardia with rate of 52 with borderline intraventricular conduction delay and low voltage QRS with anterolateral T wave inversion. Imaging: Chest x-ray showed low lung volumes with persistent trace lateral pleural effusions and mild cardiomegaly.  Head and C-spine CT showed no acute intracranial  normalities and no C-spine acute findings.  The patient was given a gram of IV Rocephin, 30 g of p.o. Kayexalate and 0.5 mg of IV Ativan as well as 1 L bolus of IV normal saline. She will be admitted to a medical bed for further evaluation and management. PAST MEDICAL HISTORY:   Past Medical History:  Diagnosis Date  . Anemia   . Atrial flutter ([place])   . Cancer [facility])    lymphoma 11 yrs. ago  . Cirrhosis ([place])   . Dysrhythmia   . Esophageal varices ([place])   . GERD (gastroesophageal reflux disease)   . Hepatitis    hep C-resolved  . History of hiatal hernia   . Hypertension   . Hypotension   . Pneumonia   . Portal hypertension ([place])     PAST SURGICAL HISTORY:   Past Surgical History:  Procedure Laterality Date  . ABDOMINAL HYSTERECTOMY    . APPENDECTOMY    . COLONOSCOPY N/A 06/03/2019   Procedure: COLONOSCOPY;  Surgeon: Toledo, Benay Pike, MD;  Location: [facility] ENDOSCOPY;  Service: Gastroenterology;  Laterality: N/A;  . EPIGASTRIC HERNIA REPAIR N/A 03/21/2020   Procedure: HERNIA REPAIR EPIGASTRIC ADULT, Open;  Surgeon: [name], MD;  Location: [facility] ORS;  Service: General;  Laterality: N/A;  . ESOPHAGOGASTRODUODENOSCOPY N/A 01/29/2019   Procedure: ESOPHAGOGASTRODUODENOSCOPY (EGD);  Surgeon: [name], MD;  Location[facility] ENDOSCOPY;  Service: Gastroenterology;  Laterality: N/A;  . ESOPHAGOGASTRODUODENOSCOPY N/A 06/01/2019   Procedure: ESOPHAGOGASTRODUODENOSCOPY (EGD);  Surgeon: Toledo, Benay Pike, MD;  Location: [facility] ENDOSCOPY;  Service: Gastroenterology;  Laterality: N/A;  . IR PARACENTESIS  08/27/2019  . IR RADIOLOGIST EVAL & MGMT  08/08/2019  . IR RADIOLOGIST EVAL & MGMT  11/14/2019  . IR TIPS  08/27/2019  . RADIOLOGY WITH ANESTHESIA N/A 08/27/2019   Procedure: IR WITH ANESTHESIA TIPS PROCEDURE;  Surgeon: [name], MD;  Location: [place];  Service: Radiology;  Laterality: N/A;  . TONSILLECTOMY      SOCIAL HISTORY:   Social History   Tobacco Use  . Smoking  status: Current Every Day Smoker    Packs/day: 1.00    Years: 35.00    Pack years: 35.00    Types: Cigarettes  . Smokeless tobacco: Never Used  Substance Use Topics  . Alcohol use: Yes    Comment: 1 sip of wine this am    FAMILY HISTORY:   Family History  Problem Relation Age of Onset  . Cancer Mother   . Cancer Father     DRUG ALLERGIES:  No Known Allergies  REVIEW OF SYSTEMS:   ROS As per history of present illness. All pertinent systems were reviewed above. Constitutional, HEENT, cardiovascular, respiratory, GI, GU, musculoskeletal, neuro, psychiatric, endocrine, integumentary and hematologic systems were reviewed and are otherwise negative/unremarkable except for positive findings mentioned above in the HPI.   MEDICATIONS AT HOME:   Prior to Admission medications   Medication Sig Start Date End Date Taking? Authorizing Provider  digoxin (LANOXIN) 0.125 MG tablet Take 1 tablet by mouth once daily 05/22/20  Yes Gollan, Kathlene November, MD  furosemide (LASIX) 40 MG tablet Take 1 tablet (40 mg total) by mouth daily. 06/18/20  Yes Minna Merritts, MD  gabapentin (NEURONTIN) 400 MG capsule Take 400 mg po qam, 800 mg po q midday, and 400 mg po qpm daily for severe chronic pain 10/02/20  Yes Tapia, Leisa, PA-C  melatonin 5 MG TABS Take 5 mg by mouth at bedtime as needed.   Yes [provider]  midodrine (PROAMATINE) 10 MG tablet Take 1 tablet (10 mg total) by mouth 3 (three) times daily with meals. 06/18/20  Yes Gollan, Kathlene November, MD  pantoprazole (PROTONIX) 20 MG tablet Take 1 tablet (20 mg total) by mouth every morning. One hour before breakfast 02/26/20  Yes Delsa Grana, PA-C  potassium chloride (KLOR-CON) 10 MEQ tablet Take 1 tablet (10 mEq total) by mouth daily. 06/18/20  Yes Gollan, Kathlene November, MD  tiZANidine (ZANAFLEX) 4 MG capsule Take 1-2 capsules (4-8 mg total) by mouth 3 (three) times daily as needed for muscle spasms. 10/02/20  Yes Delsa Grana, PA-C  lactulose  (CHRONULAC) 10 GM/15ML solution Take 45 mLs (30 g total) by mouth 3 (three) times daily. Patient not taking: Reported on 10/12/2020 08/28/19   [name], NP  metoprolol succinate (TOPROL-XL) 25 MG 24 hr tablet Take 25 mg by mouth daily. 10/04/20   [provider]      VITAL SIGNS:  Blood pressure 128/83, pulse 69, temperature 97.9 F (36.6 C), temperature source Oral, resp. rate 16, height 5\' 6"  (1.676 m), weight 72.6 kg, SpO2 95 %.  PHYSICAL EXAMINATION:  Physical Exam  GENERAL:  60 y.o.-year-old restless Caucasian female patient lying in the bed with no acute distress.  EYES: Pupils equal, round, reactive to light and accommodation. No scleral icterus. Extraocular muscles intact.  HEENT: Head atraumatic, normocephalic. Oropharynx and nasopharynx clear.  NECK:  Supple, no jugular venous distention. No thyroid enlargement, no tenderness.  LUNGS: Normal breath sounds bilaterally, no wheezing, rales,rhonchi or  crepitation. No use of accessory muscles of respiration.  CARDIOVASCULAR: Regular rate and rhythm, S1, S2 normal. No murmurs, rubs, or gallops.  ABDOMEN: Soft, nondistended, nontender. Bowel sounds present. No organomegaly or mass.  EXTREMITIES: No pedal edema, cyanosis, or clubbing.  NEUROLOGIC: Cranial nerves II through XII are intact. Muscle strength 5/5 in all extremities. Sensation intact. Gait not checked.  PSYCHIATRIC: The patient is alert and oriented x 2 to place and person but not to time.  She was fairly anxious and restless in bed. SKIN: No obvious rash, lesion, or ulcer.   LABORATORY PANEL:   CBC Recent Labs  Lab 10/23/2020 2338  WBC 10.8*  HGB 12.4  HCT 37.2  PLT 189   ------------------------------------------------------------------------------------------------------------------  Chemistries  Recent Labs  Lab 10/12/20 0225  NA 141  K 4.5  CL 106  CO2 22  GLUCOSE 102*  BUN 33*  CREATININE 1.89*  CALCIUM 9.2  AST 77*  ALT 43   ALKPHOS 252*  BILITOT 3.3*   ------------------------------------------------------------------------------------------------------------------  Cardiac Enzymes No results for input(s): TROPONINI in the last 168 hours. ------------------------------------------------------------------------------------------------------------------  RADIOLOGY:  CT Head Wo Contrast  Result Date: 10/12/2020 CLINICAL DATA:  Patient wheelchair to triage with complaints of increased confusion for the last 2-3 days worse in the last 24 hours. Pt husband reports that pt has had 2 admissions for increased ammonia d/t cirrhosis of the liver EXAM: CT HEAD WITHOUT CONTRAST CT CERVICAL SPINE WITHOUT CONTRAST TECHNIQUE: Multidetector CT imaging of the head and cervical spine was performed following the standard protocol without intravenous contrast. Multiplanar CT image reconstructions of the cervical spine were also generated. COMPARISON:  CT head 07/29/2020 FINDINGS: CT HEAD FINDINGS Brain: Patchy and confluent areas of decreased attenuation are noted throughout the deep and periventricular white matter of the cerebral hemispheres bilaterally, compatible with chronic microvascular ischemic disease. No evidence of large-territorial acute infarction. No parenchymal hemorrhage. No mass lesion. No extra-axial collection. No mass effect or midline shift. No hydrocephalus. Basilar cisterns are patent. Vascular: No hyperdense vessel. Skull: No acute fracture or focal lesion. Sinuses/Orbits: Paranasal sinuses and mastoid air cells are clear. The orbits are unremarkable. Other: None. CT CERVICAL SPINE FINDINGS Alignment: Unable to evaluate. Skull base and vertebrae: Markedly limited evaluation. Soft tissues and spinal canal: No prevertebral fluid or swelling. No visible canal hematoma. Upper chest: No large apical pneumothorax. Other: None. IMPRESSION: 1. No acute intracranial abnormality. 2. Nondiagnostic CT cervical spine. A fracture or  traumatic listhesis cannot be excluded. Recommend repeat due to motion artifact. Electronically Signed   By: Iven Finn M.D.   On: 10/12/2020 00:12   CT Cervical Spine Wo Contrast  Result Date: 10/12/2020 CLINICAL DATA:  Neck trauma, intoxicated or obtunded EXAM: CT CERVICAL SPINE WITHOUT CONTRAST TECHNIQUE: Multidetector CT imaging of the cervical spine was performed without intravenous contrast. Multiplanar CT image reconstructions were also generated. COMPARISON:  CT cervical spine 10/12/2020 FINDINGS: Limited evaluation due to motion artifact. Alignment: Reversal of the normal cervical lordosis likely due to positioning. Skull base and vertebrae: Multilevel mild degenerative changes of the spine. Lucency through the C5 vertebral body on sagittal view not visualized on coronal axial images and likely artifactual due to motion. No acute fracture. No aggressive appearing focal osseous lesion or focal pathologic process. Soft tissues and spinal canal: No prevertebral fluid or swelling. No visible canal hematoma. Upper chest: Unremarkable. Other: None. IMPRESSION: No definite acute displaced fracture or traumatic listhesis of the cervical spine with slightly limited evaluation due to motion artifact. Lucency  through the C5 vertebral body on sagittal view not visualized on coronal axial images and likely artifactual due to motion. Electronically Signed   By: Iven Finn M.D.   On: 10/12/2020 01:20   CT Cervical Spine Wo Contrast  Result Date: 10/12/2020 CLINICAL DATA:  Patient wheelchair to triage with complaints of increased confusion for the last 2-3 days worse in the last 24 hours. Pt husband reports that pt has had 2 admissions for increased ammonia d/t cirrhosis of the liver EXAM: CT HEAD WITHOUT CONTRAST CT CERVICAL SPINE WITHOUT CONTRAST TECHNIQUE: Multidetector CT imaging of the head and cervical spine was performed following the standard protocol without intravenous contrast. Multiplanar CT  image reconstructions of the cervical spine were also generated. COMPARISON:  CT head 07/29/2020 FINDINGS: CT HEAD FINDINGS Brain: Patchy and confluent areas of decreased attenuation are noted throughout the deep and periventricular white matter of the cerebral hemispheres bilaterally, compatible with chronic microvascular ischemic disease. No evidence of large-territorial acute infarction. No parenchymal hemorrhage. No mass lesion. No extra-axial collection. No mass effect or midline shift. No hydrocephalus. Basilar cisterns are patent. Vascular: No hyperdense vessel. Skull: No acute fracture or focal lesion. Sinuses/Orbits: Paranasal sinuses and mastoid air cells are clear. The orbits are unremarkable. Other: None. CT CERVICAL SPINE FINDINGS Alignment: Unable to evaluate. Skull base and vertebrae: Markedly limited evaluation. Soft tissues and spinal canal: No prevertebral fluid or swelling. No visible canal hematoma. Upper chest: No large apical pneumothorax. Other: None. IMPRESSION: 1. No acute intracranial abnormality. 2. Nondiagnostic CT cervical spine. A fracture or traumatic listhesis cannot be excluded. Recommend repeat due to motion artifact. Electronically Signed   By: Iven Finn M.D.   On: 10/12/2020 00:12   DG Chest Port 1 View  Result Date: 10/12/2020 CLINICAL DATA:  Altered mental status. EXAM: PORTABLE CHEST 1 VIEW COMPARISON:  CT chest 05/30/2020, chest x-ray 07/29/2020 FINDINGS: The heart size and mediastinal contours are unchanged. Aortic arch calcification. Biapical pleural/pulmonary scarring. Low lung volumes with bibasilar compressive changes. No focal consolidation. No pulmonary edema. Persistent trace bilateral pleural effusion. Elevated left hemidiaphragm again noted. No pneumothorax. No acute osseous abnormality.  Right upper abdomen stent noted. IMPRESSION: 1. Low lung volumes with persistent trace bilateral pleural effusions. 2. Similar-appearing enlarged cardiac silhouette.  Electronically Signed   By: Iven Finn M.D.   On: 10/12/2020 00:19      IMPRESSION AND PLAN:  Active Problems:   Hepatic encephalopathy ([place])  1.  Hepatic cephalopathy secondary to noncompliance with alcoholic liver cirrhosis. -The patient will be admitted to a medical bed. -We will restart her p.o. lactulose 45 mL / 30 g p.o. 3 times daily. -We will follow serial ammonia levels. -We will monitor mental status.  2.  UTI. -The patient will be placed on IV Rocephin and will follow urine culture and sensitivity.  3.  Essential hypertension. -We will continue beta-blocker therapy.  4.  Depression. -We will continue Prozac.  5.  History of orthostatic hypotension. -We will continue midodrine.  6.  GERD. -We will continue PPI therapy.  7.  Paroxysmal atrial fibrillation. -We will continue digoxin and beta-blocker therapy.  DVT prophylaxis: Lovenox. Code Status: full code. Family Communication:  The plan of care was discussed in details with the patient (and family). I answered all questions. The patient agreed to proceed with the above mentioned plan. Further management will depend upon hospital course. Disposition Plan: Back to previous home environment Consults called: none. All the records are reviewed and case discussed with  ED provider.  Status is: Inpatient  Remains inpatient appropriate because:Altered mental status, Ongoing diagnostic testing needed not appropriate for outpatient work up, Unsafe d/c plan, IV treatments appropriate due to intensity of illness or inability to take PO and Inpatient level of care appropriate due to severity of illness   Dispo: The patient is from: Home              Anticipated d/c is to: Home              Anticipated d/c date is: 2 days              Patient currently is not medically stable to d/c.   Difficult to place patient No   TOTAL TIME TAKING CARE OF THIS PATIENT: 55 minutes.    Christel Mormon M.D on 10/12/2020 at 4:04  AM  [facility]   From 7 PM-7 AM, contact night-coverage [email or web address]  CC: Primary care physician; Towanda Malkin, MD (Inactive)

## 2020-10-12 NOTE — ED Notes (Signed)
Patient noted to be attempting to get out of bed. Assisted to toilet w this RN. Patient had large loose stool. Pt gait unsteady. Pt cleaned up, new linens, gown, chuck pad, and brief placed on patient. Call bell within reach, patient states understands to hit button for assistance.

## 2020-10-12 NOTE — ED Notes (Signed)
Date and time results received: 10/12/20 0030 (use smartphrase ".now" to insert current time)  Test: Lactic Critical Value: 2.2  Name of Provider Notified: Beather Arbour, MD

## 2020-10-12 NOTE — ED Notes (Signed)
Lab at bedside drawing blood.

## 2020-10-13 ENCOUNTER — Encounter: Payer: Self-pay | Admitting: Family Medicine

## 2020-10-13 DIAGNOSIS — I959 Hypotension, unspecified: Secondary | ICD-10-CM

## 2020-10-13 DIAGNOSIS — Z515 Encounter for palliative care: Secondary | ICD-10-CM | POA: Diagnosis not present

## 2020-10-13 DIAGNOSIS — K7290 Hepatic failure, unspecified without coma: Secondary | ICD-10-CM | POA: Diagnosis not present

## 2020-10-13 DIAGNOSIS — N179 Acute kidney failure, unspecified: Secondary | ICD-10-CM

## 2020-10-13 DIAGNOSIS — Z7189 Other specified counseling: Secondary | ICD-10-CM

## 2020-10-13 LAB — CBC WITH DIFFERENTIAL/PLATELET
Abs Immature Granulocytes: 0.2 10*3/uL — ABNORMAL HIGH (ref 0.00–0.07)
Basophils Absolute: 0.1 10*3/uL (ref 0.0–0.1)
Basophils Relative: 1 %
Eosinophils Absolute: 0.1 10*3/uL (ref 0.0–0.5)
Eosinophils Relative: 0 %
HCT: 40.5 % (ref 36.0–46.0)
Hemoglobin: 13.1 g/dL (ref 12.0–15.0)
Immature Granulocytes: 1 %
Lymphocytes Relative: 6 %
Lymphs Abs: 1.4 10*3/uL (ref 0.7–4.0)
MCH: 32.4 pg (ref 26.0–34.0)
MCHC: 32.3 g/dL (ref 30.0–36.0)
MCV: 100.2 fL — ABNORMAL HIGH (ref 80.0–100.0)
Monocytes Absolute: 2.3 10*3/uL — ABNORMAL HIGH (ref 0.1–1.0)
Monocytes Relative: 10 %
Neutro Abs: 19.8 10*3/uL — ABNORMAL HIGH (ref 1.7–7.7)
Neutrophils Relative %: 82 %
Platelets: 171 10*3/uL (ref 150–400)
RBC: 4.04 MIL/uL (ref 3.87–5.11)
RDW: 16.5 % — ABNORMAL HIGH (ref 11.5–15.5)
WBC: 23.9 10*3/uL — ABNORMAL HIGH (ref 4.0–10.5)
nRBC: 0 % (ref 0.0–0.2)

## 2020-10-13 LAB — PROCALCITONIN: Procalcitonin: 0.26 ng/mL

## 2020-10-13 LAB — COMPREHENSIVE METABOLIC PANEL
ALT: 42 U/L (ref 0–44)
AST: 81 U/L — ABNORMAL HIGH (ref 15–41)
Albumin: 3.3 g/dL — ABNORMAL LOW (ref 3.5–5.0)
Alkaline Phosphatase: 222 U/L — ABNORMAL HIGH (ref 38–126)
Anion gap: 17 — ABNORMAL HIGH (ref 5–15)
BUN: 33 mg/dL — ABNORMAL HIGH (ref 6–20)
CO2: 17 mmol/L — ABNORMAL LOW (ref 22–32)
Calcium: 8.8 mg/dL — ABNORMAL LOW (ref 8.9–10.3)
Chloride: 108 mmol/L (ref 98–111)
Creatinine, Ser: 2.17 mg/dL — ABNORMAL HIGH (ref 0.44–1.00)
GFR, Estimated: 26 mL/min — ABNORMAL LOW (ref >60)
Glucose, Bld: 88 mg/dL (ref 70–99)
Potassium: 4.4 mmol/L (ref 3.5–5.1)
Sodium: 142 mmol/L (ref 135–145)
Total Bilirubin: 3.4 mg/dL — ABNORMAL HIGH (ref 0.3–1.2)
Total Protein: 6.3 g/dL — ABNORMAL LOW (ref 6.5–8.1)

## 2020-10-13 LAB — PHOSPHORUS: Phosphorus: 7.1 mg/dL — ABNORMAL HIGH (ref 2.5–4.6)

## 2020-10-13 LAB — URINE CULTURE: Culture: 50000 — AB

## 2020-10-13 LAB — C DIFFICILE QUICK SCREEN W PCR REFLEX
C Diff antigen: NEGATIVE
C Diff interpretation: NOT DETECTED
C Diff toxin: NEGATIVE

## 2020-10-13 LAB — AMMONIA: Ammonia: 48 umol/L — ABNORMAL HIGH (ref 9–35)

## 2020-10-13 LAB — MAGNESIUM: Magnesium: 2.2 mg/dL (ref 1.7–2.4)

## 2020-10-13 LAB — BRAIN NATRIURETIC PEPTIDE: B Natriuretic Peptide: 1828.8 pg/mL — ABNORMAL HIGH (ref 0.0–100.0)

## 2020-10-13 MED ORDER — MORPHINE SULFATE (PF) 2 MG/ML IV SOLN
2.0000 mg | Freq: Once | INTRAVENOUS | Status: AC
Start: 2020-10-13 — End: 2020-10-13
  Administered 2020-10-13: 2 mg via INTRAVENOUS

## 2020-10-13 MED ORDER — LORAZEPAM 2 MG/ML IJ SOLN
INTRAMUSCULAR | Status: AC
  Filled 2020-10-13: qty 1

## 2020-10-13 MED ORDER — MORPHINE SULFATE (PF) 2 MG/ML IV SOLN
2.0000 mg | INTRAVENOUS | Status: DC | PRN
  Administered 2020-10-13: 2 mg via INTRAVENOUS
  Filled 2020-10-13: qty 1

## 2020-10-13 MED ORDER — SODIUM BICARBONATE 650 MG PO TABS
650.0000 mg | ORAL_TABLET | Freq: Three times a day (TID) | ORAL | Status: DC
  Administered 2020-10-13: 650 mg via ORAL
  Filled 2020-10-13 (×2): qty 1

## 2020-10-13 MED ORDER — MORPHINE SULFATE (PF) 2 MG/ML IV SOLN
INTRAVENOUS | Status: AC
  Administered 2020-10-13: 2 mg via INTRAVENOUS
  Filled 2020-10-13: qty 1

## 2020-10-13 MED ORDER — LORAZEPAM 2 MG/ML IJ SOLN
1.0000 mg | INTRAMUSCULAR | Status: DC | PRN
  Administered 2020-10-13: 1 mg via INTRAVENOUS
  Filled 2020-10-13: qty 1

## 2020-10-13 MED ORDER — MORPHINE 100MG IN NS 100ML (1MG/ML) PREMIX INFUSION
5.0000 mg/h | INTRAVENOUS | Status: DC
Start: 2020-10-13 — End: 2020-10-14
  Filled 2020-10-13: qty 100

## 2020-10-13 MED ORDER — ENOXAPARIN SODIUM 30 MG/0.3ML ~~LOC~~ SOLN
30.0000 mg | SUBCUTANEOUS | Status: DC
  Administered 2020-10-13: 30 mg via SUBCUTANEOUS
  Filled 2020-10-13: qty 0.3

## 2020-10-13 MED ORDER — HALOPERIDOL LACTATE 5 MG/ML IJ SOLN
2.0000 mg | INTRAMUSCULAR | Status: DC | PRN
  Administered 2020-10-13: 2 mg via INTRAVENOUS
  Filled 2020-10-13: qty 1

## 2020-10-13 MED ORDER — ENOXAPARIN SODIUM 30 MG/0.3ML ~~LOC~~ SOLN: 30.0000 mg | SUBCUTANEOUS | Status: DC

## 2020-10-13 MED ORDER — METHYLPREDNISOLONE SODIUM SUCC 40 MG IJ SOLR
40.0000 mg | INTRAMUSCULAR | Status: DC
  Administered 2020-10-13: 40 mg via INTRAVENOUS
  Filled 2020-10-13: qty 1

## 2020-10-13 MED ORDER — FUROSEMIDE 10 MG/ML IJ SOLN
40.0000 mg | Freq: Two times a day (BID) | INTRAMUSCULAR | Status: DC
  Administered 2020-10-13: 40 mg via INTRAVENOUS
  Filled 2020-10-13: qty 4

## 2020-10-13 MED ORDER — SODIUM BICARBONATE 8.4 % IV SOLN
INTRAVENOUS | Status: DC
  Filled 2020-10-13 (×4): qty 100

## 2020-10-13 MED ORDER — LORAZEPAM 2 MG/ML IJ SOLN
1.0000 mg | Freq: Once | INTRAMUSCULAR | Status: AC
  Administered 2020-10-13: 1 mg via INTRAVENOUS

## 2020-10-16 LAB — CULTURE, BLOOD (ROUTINE X 2): Culture: NO GROWTH

## 2020-10-17 LAB — CULTURE, BLOOD (ROUTINE X 2)
Culture: NO GROWTH
Special Requests: ADEQUATE

## 2020-10-28 NOTE — Progress Notes (Signed)
At beginning of shift during shift change I reported that patient had been having a steady decline during day shift. As we were doing bedside report, patient continued to thrash all over the bed and to remove her oxygen. She was on nasal canula at 6 liters. sats were 92%, but sats were dropping quickly when she removed the oxygen. Non rebreather place on patient at 15 liters. Rapid Response called. Charge nurse notified. Sharion Settler Np, notified as well as patient's husband was called to be at the bedside. New orders received.

## 2020-10-28 NOTE — ED Notes (Signed)
Pt noted to be calling out from bedside. Pt ambulated with assistance to bedside commode. Pt noted to have loose runny stools in brief. Pt cleaned self and ambulated back to bed with assistance.

## 2020-10-28 NOTE — Progress Notes (Signed)
Called Received from Psychologist, counselling. stating that patient has a heart rate of 24.

## 2020-10-28 NOTE — Progress Notes (Signed)
O2 drops into the 80's on room air, however pt continues to take nasal cannula off. Pt also pulled IV out. Tele-sitter initiated at this time.

## 2020-10-28 NOTE — ED Notes (Signed)
Pt noted to have bag of pills in her purse that was bedside. Pts bag placed out of reach on countertop.

## 2020-10-28 NOTE — Progress Notes (Addendum)
PROGRESS NOTE    Anita Reed  LHT:[id number] DOB: [birth year] DOA: 10/25/2020 PCP: Anita Malkin, MD (Inactive)   Chief complaint altered mental status. Brief Narrative:  Anita Reed a 60 y.o.Caucasian femalewith medical history significant for hypertension, atrial flutter, alcoholic liver cirrhosis and hepatic encephalopathy on lactulose and resolved hepatitis C, who presented to the emergency room with acute onset of altered mental status with confusion over the last couple of days. The patient has not been taking her lactulose over the last couple weeks. Patient had elevated ammonia at 78, she is diagnosed with hepatic encephalopathy.  She was given fluids as well as lactulose.   Assessment & Plan:   Active Problems:   Alcoholic cirrhosis of liver with ascites (HCC)   Hypotension    Alcoholic Cirrhosis of liver without ascites (HCC)   AF (paroxysmal atrial fibrillation) (HCC)   Hepatic encephalopathy (HCC)   AKI (acute kidney injury) ([place])  #1.  Hepatic encephalopathy. Alcoholic liver cirrhosis. Dysphagia. Hypotension. Patient mental status seem to be better this morning, ammonia level has dropped down. Patient has multiple loose stools, not able to give lactulose. Continue rifaximin. Continue IV fluids. Blood pressure is more stable now.  #2. Acute kidney injury. Hepatorenal syndrome. Metabolic acidosis. Diarrhea. Renal function is worse. Potentially from multiple loose stools with dehydration. I spoke with RN, will need to collect for C. difficile. Change IV fluids to D5 bicarb drip. Recheck a BMP tomorrow.  3. Acute hypoxemic respiratory failure. Dysphagia with esophageal stenosis. COPD exacerbation. Lung sounds clear, she had some bronchospasm. Was started on DuoNeb. She was currently on 4 L oxygen, but her saturation was 98%. We will continue wean oxygen. I will also start IV steroids. Her procalcitonin level only 0.25, consistent with UTI. Not  consistent with pneumonia. Gust with patient husband yesterday, patient has esophageal stenosis. Currently her mental status improving, she is waiting for speech therapy evaluation, she probably can start a diet today. Esophageal stenosis can be addressed as outpatient.  #4. Urinary tract infection. Leukocytosis Continue Rocephin, urine culture still pending. Increase the leukocytosis noted today, with a procalcitonin level 0.25, chest x-ray did not show pneumonia.  Need to rule out C. difficile.  5. Paroxysmal atrial fibrillation. Continue digoxin and beta-blocker.  Discussed with patient husband about patient CODE STATUS, patient currently very confused.  Not able to make decision.  Per husband, patient has previously made a decision, she does not want any resuscitation, no intubation, no resuscitation, no defibrillation.  1518: BNP 1828.  Reviewed the previous echocardiogram, she has a normal ejection fraction.  She has acute on chronic diastolic congestive heart failure.  I'll discontinue fluids, started IV Lasix 40 mg twice a day.  DVT prophylaxis: Lovenox Code Status: Full Family Communication: Updated husband yesterday and today. Disposition Plan:  .   Status is: Inpatient  Remains inpatient appropriate because:Inpatient level of care appropriate due to severity of illness   Dispo: The patient is from: Home              Anticipated d/c is to: Home              Anticipated d/c date is: 3 days              Patient currently is not medically stable to d/c.   Difficult to place patient No        I/O last 3 completed shifts: In: 1199 [IV Piggyback:1199] Out: -  No intake/output data recorded.     Consultants:  None  Procedures: None  Antimicrobials: Rocephin  Subjective: Patient still on 4 L oxygen, with saturation over 98%.  Her mental status improving, she is less confused.  But she still uncomfortable in the bed in the emergency room. I spoke with the  nurse, she has been having loose stools overnight.  Nursing instructed to send stool for C. difficile. She does not feel short of breath, she has a cough, nonproductive. No fever chills. She is no longer has any dysuria hematuria.  Objective: Vitals:   October 16, 2020 0115 16-Oct-2020 0134 10-16-20 0400 10-16-2020 0700  BP: 118/72 124/69 114/75 (!) 103/46  Pulse: 81 73 94 98  Resp: 18  16 16   Temp:      TempSrc:      SpO2: 97%  100% 94%  Weight:      Height:        Intake/Output Summary (Last 24 hours) at 10-16-2020 0838 Last data filed at 2020/10/16 0156 Gross per 24 hour  Intake 99 ml  Output --  Net 99 ml   Filed Weights   10/08/2020 2251  Weight: 72.6 kg    Examination:  General exam: Appears calm, and comfortable in the ED bed Respiratory system: Decreased breathing sounds without crackles. Respiratory effort normal. Cardiovascular system: S1 & S2 heard, RRR. No JVD, murmurs, rubs, gallops or clicks. No pedal edema. Gastrointestinal system: Abdomen is nondistended, soft and nontender. No organomegaly or masses felt. Normal bowel sounds heard. Central nervous system: Alert and oriented x2. No focal neurological deficits. Extremities: Symmetric 5 x 5 power. Skin: No rashes, lesions or ulcers Psychiatry:  Mood & affect appropriate.     Data Reviewed: I have personally reviewed following labs and imaging studies  CBC: Recent Labs  Lab 10/15/2020 2338 10/12/20 0624 10-16-20 0500  WBC 10.8* 11.8* 23.9*  NEUTROABS  --   --  19.8*  HGB 12.4 12.7 13.1  HCT 37.2 37.9 40.5  MCV 97.9 97.9 100.2*  PLT 189 159 170   Basic Metabolic Panel: Recent Labs  Lab 10/12/20 0225 10/12/20 0624 Oct 16, 2020 0500  NA 141 139 142  K 4.5 4.0 4.4  CL 106 108 108  CO2 22 18* 17*  GLUCOSE 102* 96 88  BUN 33* 30* 33*  CREATININE 1.89* 1.84* 2.17*  CALCIUM 9.2 8.3* 8.8*  MG  --   --  2.2  PHOS  --   --  7.1*   GFR: Estimated Creatinine Clearance: 28.5 mL/min (A) (by C-G formula based on SCr  of 2.17 mg/dL (H)). Liver Function Tests: Recent Labs  Lab 10/12/20 0225 10/12/20 0624 October 16, 2020 0500  AST 77* 70* 81*  ALT 43 37 42  ALKPHOS 252* 228* 222*  BILITOT 3.3* 2.7* 3.4*  PROT 6.7 5.9* 6.3*  ALBUMIN 3.7 3.2* 3.3*   Recent Labs  Lab 10/23/2020 2338  LIPASE 24   Recent Labs  Lab 10/22/2020 2338 10/12/20 0624 16-Oct-2020 0500  AMMONIA 78* 96* 48*   Coagulation Profile: No results for input(s): INR, PROTIME in the last 168 hours. Cardiac Enzymes: No results for input(s): CKTOTAL, CKMB, CKMBINDEX, TROPONINI in the last 168 hours. BNP (last 3 results) No results for input(s): PROBNP in the last 8760 hours. HbA1C: No results for input(s): HGBA1C in the last 72 hours. CBG: Recent Labs  Lab 10/12/20 0057  GLUCAP 99   Lipid Profile: No results for input(s): CHOL, HDL, LDLCALC, TRIG, CHOLHDL, LDLDIRECT in the last 72 hours. Thyroid Function Tests: No results for input(s): TSH, T4TOTAL, FREET4, T3FREE, THYROIDAB  in the last 72 hours. Anemia Panel: No results for input(s): VITAMINB12, FOLATE, FERRITIN, TIBC, IRON, RETICCTPCT in the last 72 hours. Sepsis Labs: Recent Labs  Lab 10/01/2020 2338 10/12/20 0225 10/25/20 0500  PROCALCITON  --   --  0.26  LATICACIDVEN 2.2* 2.5*  --     Recent Results (from the past 240 hour(s))  Culture, blood (routine x 2)     Status: None (Preliminary result)   Collection Time: 10/04/2020 11:17 PM   Specimen: BLOOD  Result Value Ref Range Status   Specimen Description BLOOD BLOOD LEFT WRIST  Final   Special Requests   Final    BOTTLES DRAWN AEROBIC AND ANAEROBIC Blood Culture results may not be optimal due to an inadequate volume of blood received in culture bottles   Culture   Final    NO GROWTH < 12 HOURS Performed at [facility], [address], [place], [place] [address]    Report Status PENDING  Incomplete  Urine culture     Status: None (Preliminary result)   Collection Time: 10/12/20 12:36 AM   Specimen: Urine,  Random  Result Value Ref Range Status   Specimen Description   Final    URINE, RANDOM Performed at [facility], [address] [place], [place] [address]    Special Requests   Final    NONE Performed at [facility], [address], [place], [place] [address]    Culture   Final    CULTURE REINCUBATED FOR BETTER GROWTH Performed at [facility], [place] [address] [place], [place] [address]    Report Status PENDING  Incomplete  Resp Panel by RT-PCR (Flu A&B, Covid)     Status: None   Collection Time: 10/12/20 12:36 AM   Specimen: Nasopharyngeal(NP) swabs in vial transport medium  Result Value Ref Range Status   SARS Coronavirus 2 by RT PCR NEGATIVE NEGATIVE Final    Comment: (NOTE) SARS-CoV-2 target nucleic acids are NOT DETECTED.  The SARS-CoV-2 RNA is generally detectable in upper respiratory specimens during the acute phase of infection. The lowest concentration of SARS-CoV-2 viral copies this assay can detect is 138 copies/mL. A negative result does not preclude SARS-Cov-2 infection and should not be used as the sole basis for treatment or other patient management decisions. A negative result may occur with  improper specimen collection/handling, submission of specimen other than nasopharyngeal swab, presence of viral mutation(s) within the areas targeted by this assay, and inadequate number of viral copies(<138 copies/mL). A negative result must be combined with clinical observations, patient history, and epidemiological information. The expected result is Negative.  Fact Sheet for Patients:  EntrepreneurPulse.com.au  Fact Sheet for Healthcare Providers:  IncredibleEmployment.be  This test is no t yet approved or cleared by the [place] FDA and  has been authorized for detection and/or diagnosis of SARS-CoV-2 by FDA under an Emergency Use Authorization (EUA). This EUA will remain  in effect (meaning this  test can be used) for the duration of the COVID-19 declaration under Section 564(b)(1) of the Act, 21 U.S.C.section 360bbb-3(b)(1), unless the authorization is terminated  or revoked sooner.       Influenza A by PCR NEGATIVE NEGATIVE Final   Influenza B by PCR NEGATIVE NEGATIVE Final    Comment: (NOTE) The Xpert Xpress SARS-CoV-2/FLU/RSV plus assay is intended as an aid in the diagnosis of influenza from Nasopharyngeal swab specimens and should not be used as a sole basis for treatment. Nasal washings and aspirates  are unacceptable for Xpert Xpress SARS-CoV-2/FLU/RSV testing.  Fact Sheet for Patients: EntrepreneurPulse.com.au  Fact Sheet for Healthcare Providers: IncredibleEmployment.be  This test is not yet approved or cleared by the [place] FDA and has been authorized for detection and/or diagnosis of SARS-CoV-2 by FDA under an Emergency Use Authorization (EUA). This EUA will remain in effect (meaning this test can be used) for the duration of the COVID-19 declaration under Section 564(b)(1) of the Act, 21 U.S.C. section 360bbb-3(b)(1), unless the authorization is terminated or revoked.  Performed at [facility], [address] [place], [place] [address]   Culture, blood (routine x 2)     Status: None (Preliminary result)   Collection Time: 10/12/20  2:25 AM   Specimen: BLOOD  Result Value Ref Range Status   Specimen Description BLOOD BLOOD RIGHT HAND  Final   Special Requests   Final    BOTTLES DRAWN AEROBIC ONLY Blood Culture adequate volume   Culture   Final    NO GROWTH < 12 HOURS Performed at [facility], [address], [place], [place] [address]    Report Status PENDING  Incomplete         Radiology Studies: CT Head Wo Contrast  Result Date: 10/12/2020 CLINICAL DATA:  Patient wheelchair to triage with complaints of increased confusion for the last 2-3 days worse in the last 24 hours. Pt  husband reports that pt has had 2 admissions for increased ammonia d/t cirrhosis of the liver EXAM: CT HEAD WITHOUT CONTRAST CT CERVICAL SPINE WITHOUT CONTRAST TECHNIQUE: Multidetector CT imaging of the head and cervical spine was performed following the standard protocol without intravenous contrast. Multiplanar CT image reconstructions of the cervical spine were also generated. COMPARISON:  CT head 07/29/2020 FINDINGS: CT HEAD FINDINGS Brain: Patchy and confluent areas of decreased attenuation are noted throughout the deep and periventricular white matter of the cerebral hemispheres bilaterally, compatible with chronic microvascular ischemic disease. No evidence of large-territorial acute infarction. No parenchymal hemorrhage. No mass lesion. No extra-axial collection. No mass effect or midline shift. No hydrocephalus. Basilar cisterns are patent. Vascular: No hyperdense vessel. Skull: No acute fracture or focal lesion. Sinuses/Orbits: Paranasal sinuses and mastoid air cells are clear. The orbits are unremarkable. Other: None. CT CERVICAL SPINE FINDINGS Alignment: Unable to evaluate. Skull base and vertebrae: Markedly limited evaluation. Soft tissues and spinal canal: No prevertebral fluid or swelling. No visible canal hematoma. Upper chest: No large apical pneumothorax. Other: None. IMPRESSION: 1. No acute intracranial abnormality. 2. Nondiagnostic CT cervical spine. A fracture or traumatic listhesis cannot be excluded. Recommend repeat due to motion artifact. Electronically Signed   By: Iven Finn M.D.   On: 10/12/2020 00:12   CT Cervical Spine Wo Contrast  Result Date: 10/12/2020 CLINICAL DATA:  Neck trauma, intoxicated or obtunded EXAM: CT CERVICAL SPINE WITHOUT CONTRAST TECHNIQUE: Multidetector CT imaging of the cervical spine was performed without intravenous contrast. Multiplanar CT image reconstructions were also generated. COMPARISON:  CT cervical spine 10/12/2020 FINDINGS: Limited evaluation due  to motion artifact. Alignment: Reversal of the normal cervical lordosis likely due to positioning. Skull base and vertebrae: Multilevel mild degenerative changes of the spine. Lucency through the C5 vertebral body on sagittal view not visualized on coronal axial images and likely artifactual due to motion. No acute fracture. No aggressive appearing focal osseous lesion or focal pathologic process. Soft tissues and spinal canal: No prevertebral fluid or swelling. No visible canal hematoma. Upper chest: Unremarkable. Other: None. IMPRESSION: No definite acute displaced  fracture or traumatic listhesis of the cervical spine with slightly limited evaluation due to motion artifact. Lucency through the C5 vertebral body on sagittal view not visualized on coronal axial images and likely artifactual due to motion. Electronically Signed   By: Iven Finn M.D.   On: 10/12/2020 01:20   CT Cervical Spine Wo Contrast  Result Date: 10/12/2020 CLINICAL DATA:  Patient wheelchair to triage with complaints of increased confusion for the last 2-3 days worse in the last 24 hours. Pt husband reports that pt has had 2 admissions for increased ammonia d/t cirrhosis of the liver EXAM: CT HEAD WITHOUT CONTRAST CT CERVICAL SPINE WITHOUT CONTRAST TECHNIQUE: Multidetector CT imaging of the head and cervical spine was performed following the standard protocol without intravenous contrast. Multiplanar CT image reconstructions of the cervical spine were also generated. COMPARISON:  CT head 07/29/2020 FINDINGS: CT HEAD FINDINGS Brain: Patchy and confluent areas of decreased attenuation are noted throughout the deep and periventricular white matter of the cerebral hemispheres bilaterally, compatible with chronic microvascular ischemic disease. No evidence of large-territorial acute infarction. No parenchymal hemorrhage. No mass lesion. No extra-axial collection. No mass effect or midline shift. No hydrocephalus. Basilar cisterns are patent.  Vascular: No hyperdense vessel. Skull: No acute fracture or focal lesion. Sinuses/Orbits: Paranasal sinuses and mastoid air cells are clear. The orbits are unremarkable. Other: None. CT CERVICAL SPINE FINDINGS Alignment: Unable to evaluate. Skull base and vertebrae: Markedly limited evaluation. Soft tissues and spinal canal: No prevertebral fluid or swelling. No visible canal hematoma. Upper chest: No large apical pneumothorax. Other: None. IMPRESSION: 1. No acute intracranial abnormality. 2. Nondiagnostic CT cervical spine. A fracture or traumatic listhesis cannot be excluded. Recommend repeat due to motion artifact. Electronically Signed   By: Iven Finn M.D.   On: 10/12/2020 00:12   DG Chest Port 1 View  Result Date: 10/12/2020 CLINICAL DATA:  Altered mental status. EXAM: PORTABLE CHEST 1 VIEW COMPARISON:  CT chest 05/30/2020, chest x-ray 07/29/2020 FINDINGS: The heart size and mediastinal contours are unchanged. Aortic arch calcification. Biapical pleural/pulmonary scarring. Low lung volumes with bibasilar compressive changes. No focal consolidation. No pulmonary edema. Persistent trace bilateral pleural effusion. Elevated left hemidiaphragm again noted. No pneumothorax. No acute osseous abnormality.  Right upper abdomen stent noted. IMPRESSION: 1. Low lung volumes with persistent trace bilateral pleural effusions. 2. Similar-appearing enlarged cardiac silhouette. Electronically Signed   By: Iven Finn M.D.   On: 10/12/2020 00:19        Scheduled Meds: . digoxin  125 mcg Oral Daily  . enoxaparin (LOVENOX) injection  40 mg Subcutaneous Q24H  . furosemide  40 mg Oral Daily  . lactulose  300 mL Rectal Once  . metoprolol succinate  25 mg Oral Daily  . pantoprazole  20 mg Oral BH-q7a  . potassium chloride  10 mEq Oral Daily  . rifaximin  550 mg Oral BID   Continuous Infusions: . cefTRIAXone (ROCEPHIN)  IV Stopped (2020/10/19 0156)  . sodium bicarbonate in D5W 1000 mL infusion        LOS: 1 day    Time spent: 35 minutes    Sharen Hones, MD [facility]   To contact the attending provider between 7A-7P or the covering provider during after hours 7P-7A, please log into the web site [email or web address] and access using universal [place] password for that web site. If you do not have the password, please call the hospital operator.  Oct 19, 2020, 8:38 AM

## 2020-10-28 NOTE — Progress Notes (Signed)
Rapid Response Event Note   Reason for Call :   - Acute AMS, respiratory distress  Initial Focused Assessment:   - Patient is disoriented X 4; thrashing in bed - Hypoxic on NRB; removing O2 and equipment  Interventions:   - Patient husband is called by the primary RN, on his way to hospital - Hassan Rowan, NP to bedside. - Dr. Sidney Ace paged as well  Plan of Care:   - Orders entered for PRN morphine by NP - Tentative plan is to transition to comfort measures when family arrives - If family wants to pursue more aggressive treatment, patient may transfer to ICU. - No further interventions at this time until plan of care is discussed with the patient's spouse. Patient would be high risk for intubation if aggressive measures or escalation of care is desired.  Event Summary:   MD Notified: Dr. Sidney Ace; Sharion Settler, NP Call Time: 1939 hrs Arrival Time: 2000 hrs (delayed due to conflicting RRT call; [facility] was asked by this RN to assess the patient as well). End Time: 2032 hrs.  Jesse Sans, RN

## 2020-10-28 NOTE — Progress Notes (Signed)
Pt arrived to 1A138; extremely confused and restless. Pt not answering any questions at this time.   BP 101/69 (BP Location: Right Arm)   Pulse (!) 110   Temp 97.9 F (36.6 C) (Oral)   Resp 16   Ht 5\' 6"  (1.676 m)   Wt 72.6 kg   SpO2 91%   BMI 25.82 kg/m

## 2020-10-28 NOTE — Progress Notes (Signed)
[facility] Liaison note:  This patient is currently followed by [facility] community based palliative program at home.  TOC Meagan Hagwood aware. Liaison will follow for discharge disposition. Flo Shanks BSN, RN, [place] [phone]

## 2020-10-28 NOTE — Death Summary Note (Signed)
DEATH SUMMARY   Patient Details  Name: Anita Reed MRN: [id number] DOB: 02/07/1961  Admission/Discharge Information   Admit Date:  10-31-20  Date of Death: Date of Death: 11/02/2020  Time of Death: Time of Death: Dec 20, 2098  Length of Stay: 1  Referring Physician: Towanda Malkin, MD (Inactive)   Reason(s) for Hospitalization  Hepatic encephalopathy.  Diagnoses  Preliminary cause of death: Multiorgan failure. Secondary Diagnoses (including complications and co-morbidities):  Active Problems:   Alcoholic cirrhosis of liver with ascites (HCC)   Hypotension    Alcoholic Cirrhosis of liver without ascites (HCC)   AF (paroxysmal atrial fibrillation) (HCC)   Hepatic encephalopathy (HCC)   AKI (acute kidney injury) ([facility]) Acute respiratory failure secondary to COPD exacerbation and acute on chronic diastolic congestive heart failure. Acute on chronic diastolic congestive heart failure. Hepatorenal syndrome. COPD exacerbation. Metabolic acidosis. Hypotension. Dysphagia.   Brief Hospital Course (including significant findings, care, treatment, and services provided and events leading to death)   Anita Reed a 60 y.o.Caucasian femalewith medical history significant for hypertension, atrial flutter, alcoholic liver cirrhosis and hepatic encephalopathy on lactulose and resolved hepatitis C, who presented to the emergency room with acute onset of altered mental status with confusion over the last couple of days. The patient has not been taking her lactulose over the last couple weeks. Patient had elevated ammonia at 78, she is diagnosed with hepatic encephalopathy. She was given fluids as well as lactulose. While in the hospital, patient developed acute respiratory failure, COPD is aspiration, acute on chronic diastolic congestive heart failure.  Acute renal failure secondary to hepatorenal syndrome.  She also had initial hypotension. Patient condition deteriorated yesterday,  she was transitioned to comfort care and placed on morphine drip.  She died in peace.  Possible death is multiorgan failure.    Pertinent Labs and Studies  Significant Diagnostic Studies CT Head Wo Contrast  Result Date: 10/12/2020 CLINICAL DATA:  Patient wheelchair to triage with complaints of increased confusion for the last 2-3 days worse in the last 24 hours. Pt husband reports that pt has had 2 admissions for increased ammonia d/t cirrhosis of the liver EXAM: CT HEAD WITHOUT CONTRAST CT CERVICAL SPINE WITHOUT CONTRAST TECHNIQUE: Multidetector CT imaging of the head and cervical spine was performed following the standard protocol without intravenous contrast. Multiplanar CT image reconstructions of the cervical spine were also generated. COMPARISON:  CT head 07/29/2020 FINDINGS: CT HEAD FINDINGS Brain: Patchy and confluent areas of decreased attenuation are noted throughout the deep and periventricular white matter of the cerebral hemispheres bilaterally, compatible with chronic microvascular ischemic disease. No evidence of large-territorial acute infarction. No parenchymal hemorrhage. No mass lesion. No extra-axial collection. No mass effect or midline shift. No hydrocephalus. Basilar cisterns are patent. Vascular: No hyperdense vessel. Skull: No acute fracture or focal lesion. Sinuses/Orbits: Paranasal sinuses and mastoid air cells are clear. The orbits are unremarkable. Other: None. CT CERVICAL SPINE FINDINGS Alignment: Unable to evaluate. Skull base and vertebrae: Markedly limited evaluation. Soft tissues and spinal canal: No prevertebral fluid or swelling. No visible canal hematoma. Upper chest: No large apical pneumothorax. Other: None. IMPRESSION: 1. No acute intracranial abnormality. 2. Nondiagnostic CT cervical spine. A fracture or traumatic listhesis cannot be excluded. Recommend repeat due to motion artifact. Electronically Signed   By: Iven Finn M.D.   On: 10/12/2020 00:12   CT  Cervical Spine Wo Contrast  Result Date: 10/12/2020 CLINICAL DATA:  Neck trauma, intoxicated or obtunded EXAM: CT CERVICAL SPINE WITHOUT CONTRAST TECHNIQUE:  Multidetector CT imaging of the cervical spine was performed without intravenous contrast. Multiplanar CT image reconstructions were also generated. COMPARISON:  CT cervical spine 10/12/2020 FINDINGS: Limited evaluation due to motion artifact. Alignment: Reversal of the normal cervical lordosis likely due to positioning. Skull base and vertebrae: Multilevel mild degenerative changes of the spine. Lucency through the C5 vertebral body on sagittal view not visualized on coronal axial images and likely artifactual due to motion. No acute fracture. No aggressive appearing focal osseous lesion or focal pathologic process. Soft tissues and spinal canal: No prevertebral fluid or swelling. No visible canal hematoma. Upper chest: Unremarkable. Other: None. IMPRESSION: No definite acute displaced fracture or traumatic listhesis of the cervical spine with slightly limited evaluation due to motion artifact. Lucency through the C5 vertebral body on sagittal view not visualized on coronal axial images and likely artifactual due to motion. Electronically Signed   By: Iven Finn M.D.   On: 10/12/2020 01:20   CT Cervical Spine Wo Contrast  Result Date: 10/12/2020 CLINICAL DATA:  Patient wheelchair to triage with complaints of increased confusion for the last 2-3 days worse in the last 24 hours. Pt husband reports that pt has had 2 admissions for increased ammonia d/t cirrhosis of the liver EXAM: CT HEAD WITHOUT CONTRAST CT CERVICAL SPINE WITHOUT CONTRAST TECHNIQUE: Multidetector CT imaging of the head and cervical spine was performed following the standard protocol without intravenous contrast. Multiplanar CT image reconstructions of the cervical spine were also generated. COMPARISON:  CT head 07/29/2020 FINDINGS: CT HEAD FINDINGS Brain: Patchy and confluent areas of  decreased attenuation are noted throughout the deep and periventricular white matter of the cerebral hemispheres bilaterally, compatible with chronic microvascular ischemic disease. No evidence of large-territorial acute infarction. No parenchymal hemorrhage. No mass lesion. No extra-axial collection. No mass effect or midline shift. No hydrocephalus. Basilar cisterns are patent. Vascular: No hyperdense vessel. Skull: No acute fracture or focal lesion. Sinuses/Orbits: Paranasal sinuses and mastoid air cells are clear. The orbits are unremarkable. Other: None. CT CERVICAL SPINE FINDINGS Alignment: Unable to evaluate. Skull base and vertebrae: Markedly limited evaluation. Soft tissues and spinal canal: No prevertebral fluid or swelling. No visible canal hematoma. Upper chest: No large apical pneumothorax. Other: None. IMPRESSION: 1. No acute intracranial abnormality. 2. Nondiagnostic CT cervical spine. A fracture or traumatic listhesis cannot be excluded. Recommend repeat due to motion artifact. Electronically Signed   By: Iven Finn M.D.   On: 10/12/2020 00:12   DG Chest Port 1 View  Result Date: 10/12/2020 CLINICAL DATA:  Altered mental status. EXAM: PORTABLE CHEST 1 VIEW COMPARISON:  CT chest 05/30/2020, chest x-ray 07/29/2020 FINDINGS: The heart size and mediastinal contours are unchanged. Aortic arch calcification. Biapical pleural/pulmonary scarring. Low lung volumes with bibasilar compressive changes. No focal consolidation. No pulmonary edema. Persistent trace bilateral pleural effusion. Elevated left hemidiaphragm again noted. No pneumothorax. No acute osseous abnormality.  Right upper abdomen stent noted. IMPRESSION: 1. Low lung volumes with persistent trace bilateral pleural effusions. 2. Similar-appearing enlarged cardiac silhouette. Electronically Signed   By: Iven Finn M.D.   On: 10/12/2020 00:19    Microbiology Recent Results (from the past 240 hour(s))  Culture, blood (routine x 2)      Status: None (Preliminary result)   Collection Time: 10/14/2020 11:17 PM   Specimen: BLOOD  Result Value Ref Range Status   Specimen Description BLOOD BLOOD LEFT WRIST  Final   Special Requests   Final    BOTTLES DRAWN AEROBIC AND ANAEROBIC Blood  Culture results may not be optimal due to an inadequate volume of blood received in culture bottles   Culture   Final    NO GROWTH 2 DAYS Performed at [facility], [address] [place], [place] [address]    Report Status PENDING  Incomplete  Urine culture     Status: Abnormal   Collection Time: 10/12/20 12:36 AM   Specimen: Urine, Random  Result Value Ref Range Status   Specimen Description   Final    URINE, RANDOM Performed at [facility], [address], [place], [place] [address]    Special Requests   Final    NONE Performed at [facility], [address] [place], [place] [address]    Culture (A)  Final    50,000 COLONIES/mL STREPTOCOCCUS GROUP G Beta hemolytic streptococci are predictably susceptible to penicillin and other beta lactams. Susceptibility testing not routinely performed. Performed at [facility], [place] [address] [place], [place] [address]    Report Status November 02, 2020 FINAL  Final  Resp Panel by RT-PCR (Flu A&B, Covid)     Status: None   Collection Time: 10/12/20 12:36 AM   Specimen: Nasopharyngeal(NP) swabs in vial transport medium  Result Value Ref Range Status   SARS Coronavirus 2 by RT PCR NEGATIVE NEGATIVE Final    Comment: (NOTE) SARS-CoV-2 target nucleic acids are NOT DETECTED.  The SARS-CoV-2 RNA is generally detectable in upper respiratory specimens during the acute phase of infection. The lowest concentration of SARS-CoV-2 viral copies this assay can detect is 138 copies/mL. A negative result does not preclude SARS-Cov-2 infection and should not be used as the sole basis for treatment or other patient management decisions. A negative result may occur with   improper specimen collection/handling, submission of specimen other than nasopharyngeal swab, presence of viral mutation(s) within the areas targeted by this assay, and inadequate number of viral copies(<138 copies/mL). A negative result must be combined with clinical observations, patient history, and epidemiological information. The expected result is Negative.  Fact Sheet for Patients:  EntrepreneurPulse.com.au  Fact Sheet for Healthcare Providers:  IncredibleEmployment.be  This test is no t yet approved or cleared by the [place] FDA and  has been authorized for detection and/or diagnosis of SARS-CoV-2 by FDA under an Emergency Use Authorization (EUA). This EUA will remain  in effect (meaning this test can be used) for the duration of the COVID-19 declaration under Section 564(b)(1) of the Act, 21 U.S.C.section 360bbb-3(b)(1), unless the authorization is terminated  or revoked sooner.       Influenza A by PCR NEGATIVE NEGATIVE Final   Influenza B by PCR NEGATIVE NEGATIVE Final    Comment: (NOTE) The Xpert Xpress SARS-CoV-2/FLU/RSV plus assay is intended as an aid in the diagnosis of influenza from Nasopharyngeal swab specimens and should not be used as a sole basis for treatment. Nasal washings and aspirates are unacceptable for Xpert Xpress SARS-CoV-2/FLU/RSV testing.  Fact Sheet for Patients: EntrepreneurPulse.com.au  Fact Sheet for Healthcare Providers: IncredibleEmployment.be  This test is not yet approved or cleared by the [place] FDA and has been authorized for detection and/or diagnosis of SARS-CoV-2 by FDA under an Emergency Use Authorization (EUA). This EUA will remain in effect (meaning this test can be used) for the duration of the COVID-19 declaration under Section 564(b)(1) of the Act, 21 U.S.C. section 360bbb-3(b)(1), unless the authorization is terminated  or revoked.  Performed at [facility], [address] [place], [place] [address]  Culture, blood (routine x 2)     Status: None (Preliminary result)   Collection Time: 10/12/20  2:25 AM   Specimen: BLOOD  Result Value Ref Range Status   Specimen Description BLOOD BLOOD RIGHT HAND  Final   Special Requests   Final    BOTTLES DRAWN AEROBIC ONLY Blood Culture adequate volume   Culture   Final    NO GROWTH 1 DAY Performed at [facility], [address], [place], [place] [address]    Report Status PENDING  Incomplete  C Difficile Quick Screen w PCR reflex     Status: None   Collection Time: 10/12/20  2:45 PM   Specimen: STOOL  Result Value Ref Range Status   C Diff antigen NEGATIVE NEGATIVE Final   C Diff toxin NEGATIVE NEGATIVE Final   C Diff interpretation No C. difficile detected.  Final    Comment: Performed at [facility], [address] [place], [place] [address]    Lab Basic Metabolic Panel: Recent Labs  Lab 10/12/20 0225 10/12/20 0624 10-19-20 0500  NA 141 139 142  K 4.5 4.0 4.4  CL 106 108 108  CO2 22 18* 17*  GLUCOSE 102* 96 88  BUN 33* 30* 33*  CREATININE 1.89* 1.84* 2.17*  CALCIUM 9.2 8.3* 8.8*  MG  --   --  2.2  PHOS  --   --  7.1*   Liver Function Tests: Recent Labs  Lab 10/12/20 0225 10/12/20 0624 2020-10-19 0500  AST 77* 70* 81*  ALT 43 37 42  ALKPHOS 252* 228* 222*  BILITOT 3.3* 2.7* 3.4*  PROT 6.7 5.9* 6.3*  ALBUMIN 3.7 3.2* 3.3*   Recent Labs  Lab 10/25/2020 2338  LIPASE 24   Recent Labs  Lab 10/04/2020 2338 10/12/20 0624 10/19/20 0500  AMMONIA 78* 96* 48*   CBC: Recent Labs  Lab 10/26/2020 2338 10/12/20 0624 10/19/2020 0500  WBC 10.8* 11.8* 23.9*  NEUTROABS  --   --  19.8*  HGB 12.4 12.7 13.1  HCT 37.2 37.9 40.5  MCV 97.9 97.9 100.2*  PLT 189 159 171   Cardiac Enzymes: No results for input(s): CKTOTAL, CKMB, CKMBINDEX, TROPONINI in the last 168 hours. Sepsis Labs: Recent Labs  Lab  10/10/2020 2338 10/12/20 0225 10/12/20 0624 10-19-2020 0500  PROCALCITON  --   --   --  0.26  WBC 10.8*  --  11.8* 23.9*  LATICACIDVEN 2.2* 2.5*  --   --     Procedures/Operations  None   Anita Reed 10/14/2020, 7:05 AM

## 2020-10-28 NOTE — Progress Notes (Signed)
Patient expired at 2100. No pulse, no chest rise and fall, no heart beat noted. Verified by Charge Nurse Nickola Major, Rn. Sharion Settler NP, Notified, Husband was at the bedside.

## 2020-10-28 NOTE — Progress Notes (Signed)
PHARMACIST - PHYSICIAN COMMUNICATION  CONCERNING:  Enoxaparin (Lovenox) for DVT Prophylaxis    RECOMMENDATION: Patient was prescribed enoxaprin 40mg  q24 hours for VTE prophylaxis.   Filed Weights   09/30/2020 2251  Weight: 72.6 kg (160 lb)    Body mass index is 25.82 kg/m.  Estimated Creatinine Clearance: 28.5 mL/min (A) (by C-G formula based on SCr of 2.17 mg/dL (H)).  Patient is candidate for enoxaparin 30mg  every 24 hours based on CrCl <23ml/min   DESCRIPTION: Pharmacy has adjusted enoxaparin dose per [facility] policy.  Patient is now receiving enoxaparin 30 mg every 24 hours    Lu Duffel, PharmD Clinical Pharmacist  2020-11-01 10:49 AM

## 2020-10-28 NOTE — Progress Notes (Addendum)
Anita Palmeris a 60 y.o.Caucasian femalewith medical history significant for hypertension, atrial flutter, alcoholic liver cirrhosis and hepatic encephalopathy on lactulose and resolved hepatitis C, who presented to the emergency room with acute onset of altered mental status with confusion over the last couple of days. The patient has not been taking her lactulose over the last couple weeks. Patient had elevated ammonia at 78, she is diagnosed with hepatic encephalopathy. She was given fluids as well as lactulose. Palliative care discussion today - notes reviewed Patient with declined status reported, air hunger significant and further mentation deterioration.  Husband called and detailed discussion about goals.  Husband withes to transition to complete comfort care and does not want her to suffer.  He prefers continuous morphine infusion at this time rather that bolus dosing due to the degree of her air hunger and obvious discomfort with writhing in the bed.   Chaplain on call paged and is with husband as is staff for support.    ADDENDUM - Notified from nursing patient expired 12/10/2098. RN made death pronouncement.  Attending to complete electronic death certificate via [place] and summary

## 2020-10-28 NOTE — ED Notes (Signed)
VSS, however, pt having abdominal breathing.  Dr Roosevelt Locks notified.  Informed Dr. Roosevelt Locks that night shift held lactulose enema d/t pt having diarrhea during night shift, but stool was not sent.  Also holding PO d/t aspiration precautions.

## 2020-10-28 NOTE — Consult Note (Signed)
Consultation Note Date: November 06, 2020   Patient Name: Anita Reed  DOB: [birth year]  MRN: [id number]  Age / Sex: 60 y.o., female  PCP: Towanda Malkin, MD (Inactive) Referring Physician: Christel Mormon, MD  Reason for Consultation: Establishing goals of care  HPI/Patient Profile: Anita Reed is a 60 y.o. Caucasian female with medical history significant for hypertension, atrial flutter, alcoholic liver cirrhosis and hepatic encephalopathy on lactulose and resolved hepatitis C, who presented to the emergency room with acute onset of altered mental status with confusion over the last couple of days. The patient has not been taking her lactulose over the last couple weeks.   Clinical Assessment and Goals of Care: Patient is resting in bed. She is confused. Called to speak with husband. He states they have been married 22 years. He states she has 1 child who is for the most part estranged but she talks to once in a while.   He states she has been declining. He states she has had exhastive work up while living in [place]. He advises she has IBS at baseline. He discusses a pattern where she uses Lactulose, and either develops severe cramps and does not eat or drink leading her to the hospital, or to stop taking Lactulose thus resulting in confusion that leads to going to the hospital. He discusses her recent bout with colitis and c-diff.   We discussed her prognosis, and GOC.  The difference between an aggressive medical intervention path and a comfort care path was discussed.  Values and goals of care important to patient and family were attempted to be elicited. Discussed limitations of medical interventions to prolong quality of life in some situations and discussed the concept of human mortality.  He states he has understood for years she could die. He states he understands every year she is here, he is lucky.  He tells me she is tired. He states she just wants to live a normal life where she can leave the house without needing to have a BM. He states there was discussion about a liver transplant and wonders what came of that. Discussed previous palliative note discussing no dialysis, no PEG and no IVF. He states she would not want to be kept alive by machines. He confirms DNR/DNI status.     SUMMARY OF RECOMMENDATIONS   Will continue to follow. Husband asking if she is a liver transplant candidate.  DNR/DNI. No dialysis. No PEG. No prolonged IV fluid acting to prolong life (artificial hydration).      Primary Diagnoses: Present on Admission: . Hepatic encephalopathy ([place]) . AF (paroxysmal atrial fibrillation) ([place]) . Hypotension . Alcoholic cirrhosis of liver with ascites ([place]) .  Alcoholic Cirrhosis of liver without ascites ([place])   I have reviewed the medical record, interviewed the patient and family, and examined the patient. The following aspects are pertinent.  Past Medical History:  Diagnosis Date  . Anemia   . Atrial flutter ([place])   . Bowel dysfunction    per spoused possible IBS/Chron's  .  Cancer [facility])    lymphoma 11 yrs. ago  . Cirrhosis ([place])   . Compression fracture of L1 lumbar vertebra (HCC)    per husband compression of L1 and L2  . Dysrhythmia   . Esophageal varices ([place])   . GERD (gastroesophageal reflux disease)   . Hepatitis    hep C-resolved  . History of hiatal hernia   . Hypertension   . Hypotension   . Pneumonia   . Portal hypertension (HCC)    Social History   Socioeconomic History  . Marital status: Married    Spouse name: [name]  . Number of children: 1  . Years of education: 53  . Highest education level: 9th grade  Occupational History  . Occupation: disabled  Tobacco Use  . Smoking status: Current Every Day Smoker    Packs/day: 1.00    Years: 35.00    Pack years: 35.00    Types: Cigarettes  . Smokeless tobacco: Never Used  Vaping Use   . Vaping Use: Never used  Substance and Sexual Activity  . Alcohol use: Yes    Comment: 1 sip of wine this am, per husband she does not drink  . Drug use: Never  . Sexual activity: Not Currently  Other Topics Concern  . Not on file  Social History Narrative  . Not on file   Social Determinants of Health   Financial Resource Strain: Low Risk   . Difficulty of Paying Living Expenses: Not very hard  Food Insecurity: No Food Insecurity  . Worried About Charity fundraiser in the Last Year: Never true  . Ran Out of Food in the Last Year: Never true  Transportation Needs: No Transportation Needs  . Lack of Transportation (Medical): No  . Lack of Transportation (Non-Medical): No  Physical Activity: Inactive  . Days of Exercise per Week: 0 days  . Minutes of Exercise per Session: 0 min  Stress: No Stress Concern Present  . Feeling of Stress : Only a little  Social Connections: Moderately Isolated  . Frequency of Communication with Friends and Family: More than three times a week  . Frequency of Social Gatherings with Friends and Family: Never  . Attends Religious Services: Never  . Active Member of Clubs or Organizations: No  . Attends Archivist Meetings: Never  . Marital Status: Married   Family History  Problem Relation Age of Onset  . Cancer Mother   . Cancer Father    Scheduled Meds: . digoxin  125 mcg Oral Daily  . enoxaparin (LOVENOX) injection  30 mg Subcutaneous Q24H  . furosemide  40 mg Intravenous Q12H  . lactulose  300 mL Rectal Once  . methylPREDNISolone (SOLU-MEDROL) injection  40 mg Intravenous Q24H  . metoprolol succinate  25 mg Oral Daily  . pantoprazole  20 mg Oral BH-q7a  . potassium chloride  10 mEq Oral Daily  . rifaximin  550 mg Oral BID  . sodium bicarbonate  650 mg Oral TID   Continuous Infusions: . cefTRIAXone (ROCEPHIN)  IV Stopped (2020/10/30 0156)   PRN Meds:.acetaminophen **OR** acetaminophen, diphenhydrAMINE, ipratropium-albuterol,  magnesium hydroxide, melatonin, ondansetron **OR** ondansetron (ZOFRAN) IV, tiZANidine Medications Prior to Admission:  Prior to Admission medications   Medication Sig Start Date End Date Taking? Authorizing Provider  digoxin (LANOXIN) 0.125 MG tablet Take 1 tablet by mouth once daily 05/22/20  Yes Gollan, Kathlene November, MD  furosemide (LASIX) 40 MG tablet Take 1 tablet (40 mg total) by mouth daily. 06/18/20  Yes Minna Merritts, MD  gabapentin (NEURONTIN) 400 MG capsule Take 400 mg po qam, 800 mg po q midday, and 400 mg po qpm daily for severe chronic pain 10/02/20  Yes Tapia, Leisa, PA-C  melatonin 5 MG TABS Take 5 mg by mouth at bedtime as needed.   Yes [provider]  midodrine (PROAMATINE) 10 MG tablet Take 1 tablet (10 mg total) by mouth 3 (three) times daily with meals. 06/18/20  Yes Gollan, Kathlene November, MD  pantoprazole (PROTONIX) 20 MG tablet Take 1 tablet (20 mg total) by mouth every morning. One hour before breakfast 02/26/20  Yes Delsa Grana, PA-C  potassium chloride (KLOR-CON) 10 MEQ tablet Take 1 tablet (10 mEq total) by mouth daily. 06/18/20  Yes Gollan, Kathlene November, MD  tiZANidine (ZANAFLEX) 4 MG capsule Take 1-2 capsules (4-8 mg total) by mouth 3 (three) times daily as needed for muscle spasms. 10/02/20  Yes Delsa Grana, PA-C  lactulose (CHRONULAC) 10 GM/15ML solution Take 45 mLs (30 g total) by mouth 3 (three) times daily. Patient not taking: Reported on 10/12/2020 08/28/19   [name], NP  metoprolol succinate (TOPROL-XL) 25 MG 24 hr tablet Take 25 mg by mouth daily. 10/04/20   [provider]   No Known Allergies Review of Systems  Unable to perform ROS   Physical Exam Constitutional:      Comments: Confused.   Pulmonary:     Effort: Pulmonary effort is normal.  Neurological:     Mental Status: She is alert.     Vital Signs: BP 126/81 (BP Location: Left Arm)   Pulse (!) 110   Temp 97.6 F (36.4 C)   Resp 20   Ht 5\' 6"  (1.676 m)   Wt 72.6 kg    SpO2 (!) 87%   BMI 25.82 kg/m  Pain Scale: 0-10   Pain Score: 0-No pain   SpO2: SpO2: (!) 87 % O2 Device:SpO2: (!) 87 % O2 Flow Rate: .O2 Flow Rate (L/min): 4 L/min  IO: Intake/output summary:   Intake/Output Summary (Last 24 hours) at 28-Oct-2020 1553 Last data filed at 10/28/2020 0156 Gross per 24 hour  Intake 99 ml  Output --  Net 99 ml    LBM: Last BM Date: 10/12/20 Baseline Weight: Weight: 72.6 kg Most recent weight: Weight: 72.6 kg      Time In: 3:30 Time Out: 4:00 Time Total: 30 min Greater than 50%  of this time was spent counseling and coordinating care related to the above assessment and plan.  Signed by: Asencion Gowda, NP   Please contact Palliative Medicine Team phone at [phone] for questions and concerns.  For individual provider: See Shea Evans

## 2020-10-28 NOTE — Progress Notes (Signed)
I was noted in recent palliative care note that pt is requesting to be a DNR but there is no other documentation found. Also, all PO medications were held this morning d/t pending SLP evaluation. MD notified.

## 2020-10-28 NOTE — Evaluation (Signed)
Clinical/Bedside Swallow Evaluation Patient Details  Name: Anita Reed MRN: [id number] Date of Birth: Mar 30, 1961  Today's Date: 11-05-20 Time: SLP Start Time (ACUTE ONLY): 1400 SLP Stop Time (ACUTE ONLY): 1500 SLP Time Calculation (min) (ACUTE ONLY): 60 min  Past Medical History:  Past Medical History:  Diagnosis Date  . Anemia   . Atrial flutter ([place])   . Bowel dysfunction    per spoused possible IBS/Chron's  . Cancer [facility])    lymphoma 11 yrs. ago  . Cirrhosis ([place])   . Compression fracture of L1 lumbar vertebra (HCC)    per husband compression of L1 and L2  . Dysrhythmia   . Esophageal varices ([place])   . GERD (gastroesophageal reflux disease)   . Hepatitis    hep C-resolved  . History of hiatal hernia   . Hypertension   . Hypotension   . Pneumonia   . Portal hypertension (HCC)    Past Surgical History:  Past Surgical History:  Procedure Laterality Date  . ABDOMINAL HYSTERECTOMY    . APPENDECTOMY    . COLONOSCOPY N/A 06/03/2019   Procedure: COLONOSCOPY;  Surgeon: Toledo, Benay Pike, MD;  Location: [facility] ENDOSCOPY;  Service: Gastroenterology;  Laterality: N/A;  . EPIGASTRIC HERNIA REPAIR N/A 03/21/2020   Procedure: HERNIA REPAIR EPIGASTRIC ADULT, Open;  Surgeon: [name], MD;  Location: [facility] ORS;  Service: General;  Laterality: N/A;  . ESOPHAGOGASTRODUODENOSCOPY N/A 01/29/2019   Procedure: ESOPHAGOGASTRODUODENOSCOPY (EGD);  Surgeon: [name], MD;  Location: [facility] ENDOSCOPY;  Service: Gastroenterology;  Laterality: N/A;  . ESOPHAGOGASTRODUODENOSCOPY N/A 06/01/2019   Procedure: ESOPHAGOGASTRODUODENOSCOPY (EGD);  Surgeon: Toledo, Benay Pike, MD;  Location: [facility] ENDOSCOPY;  Service: Gastroenterology;  Laterality: N/A;  . IR PARACENTESIS  08/27/2019  . IR RADIOLOGIST EVAL & MGMT  08/08/2019  . IR RADIOLOGIST EVAL & MGMT  11/14/2019  . IR TIPS  08/27/2019  . RADIOLOGY WITH ANESTHESIA N/A 08/27/2019   Procedure: IR WITH ANESTHESIA TIPS PROCEDURE;  Surgeon: [name], MD;  Location: [place];  Service: Radiology;  Laterality: N/A;  . TONSILLECTOMY     HPI:  Pt is a 60 y.o. female brought to the ED from home by her husband with a chief complaint of confusion and possible concern of Esophageal Stricture per MD report(?).  Patient has a history of alcoholic cirrhosis, esophageal varices, portal hypertension who admits she stopped taking her lactulose 2 weeks ago because it was inconvenient for her to leave her house due to having increased bowel movements.  Spouse told triage nurse that he has noted increased confusion for the past 2 to 3 days, worse in the past 24 hours.  Patient tells me she has had multiple falls within the past 24 hours.  Complains of neck pain and difficulty breathing.  Pt has a long standing Medical history w/ Multiple dxs including Alcoholic Cirrhosis, with esophageal varices; GERD; Hiatal Hernia; GIB (gastrointestinal bleeding); underweight; chronic Pain; Palliative Care following in the Home.  CXR: Low lung volumes with persistent trace bilateral pleural  effusions.   Assessment / Plan / Recommendation Clinical Impression  Pt appears to present w/ oropharyngeal and Potential Esophageal phase dysphagia w/ increased risk for aspiration especially w/ Backflow of Reflux material After the swallow. Risk is decreased if following general aspiration and Reflux precautions. No neurological weakness or deficits were apparent. Pt has a GI history including GERD and Hiatal Hernia; potential Stricture per report. Noted pt appeared easily distracted often jumping from topic to topic in conversation. Pt is on [place] O2  w/ wbc elevated. Pt explained general aspiration precautions and agreed verbally to the need for following them especially sitting upright for all oral intake and using small bites/sips. Pt positioned herself sitting in middle of bed w/ mild lower back discomfort which she stated was Baseline for her. Pt also has Baseline Esophageal dysmotility --  alcoholic cirrhosis with esophageal varices per chart notes. Pt consumed Cup trials of Nectar consistency liquids via cup/straw, then purees and broken, soft solids w/ no immediate, overt clinical s/s of aspiration noted w/ any consistency; respiratory status remained calm and unlabored, vocal quality clear b/t trials. Pt required instructions for single, small sips slowly. Coughing noted w/ trials of thin liquids via Cup -- poor attention to task apparent. Oral phase appeared grossly [facility] for bolus management, mastication, and timely A-P transfer for swallowing w/ trials including semi-solid foods moistened well; oral clearing achieved w/ all consistencies. Discussed and modeled w/ pt the need for Small, single bites of foods for ease of Esophageal motility, clearing, and encouraged alternating foods/liquids to aid also. Discussed and modeled general aspiration precautions to include small, single boluses, full upright sitting, No talking during eating/drinking -- reducing distractions. Discussed Esophageal phase dysmotility and encouraged following REFLUX precautions. Pt agreed verbally but unsure of her level of comprehenstion currently. She continues to be easily Distracted and needed verbal redirection to tasks.  Recommend a more MINCED foods diet w/ Nectar liquids currently w/ gravies added to moisten foods; general aspiration precautions; Pills Crushed in Puree for ease of swallowing and clearing the Esophagus; tray setup and positioning assistance for meals. Supervision w/ all po's currently. ST services will f/u w/ pt's status and ongoing assessment. MD/NSG updated. Precautions posted at bedside. SLP Visit Diagnosis: Dysphagia, pharyngoesophageal phase (R13.14) (Cognitive decline impacting awareness and oral phase)    Aspiration Risk  Mild aspiration risk;Risk for inadequate nutrition/hydration (risk from Backflow from Esophagus)    Diet Recommendation  Dysphagia level 2 (MINCED foods w/ gravies);  Nectar consistency liquids. General aspiration and REFLUX/GERD precautions. Supervision w/ all oral intake d/t Confusion currently.   Medication Administration: Crushed with puree (for safer swallowing and Esophageal clearing)    Other  Recommendations Recommended Consults: Consider GI evaluation;Consider esophageal assessment (Dietician f/u; Palliative care f/u) Oral Care Recommendations: Oral care BID;Oral care before and after PO;Staff/trained caregiver to provide oral care Other Recommendations: Order thickener from pharmacy;Prohibited food (jello, ice cream, thin soups);Remove water pitcher;Have oral suction available   Follow up Recommendations  (TBD)      Frequency and Duration min 3x week  2 weeks       Prognosis Prognosis for Safe Diet Advancement: Fair Barriers to Reach Goals: Cognitive deficits;Time post onset;Severity of deficits (Esophageal phase deficits premorbid)      Swallow Study   General Date of Onset: 10/08/2020 HPI: Pt is a 60 y.o. female brought to the ED from home by her husband with a chief complaint of confusion and possible concern of Esophageal Stricture per MD report(?).  Patient has a history of alcoholic cirrhosis, esophageal varices, portal hypertension who admits she stopped taking her lactulose 2 weeks ago because it was inconvenient for her to leave her house due to having increased bowel movements.  Spouse told triage nurse that he has noted increased confusion for the past 2 to 3 days, worse in the past 24 hours.  Patient tells me she has had multiple falls within the past 24 hours.  Complains of neck pain and difficulty breathing.  Pt has a long  standing Medical history w/ Multiple dxs including Alcoholic Cirrhosis, with esophageal varices; GERD; Hiatal Hernia; GIB (gastrointestinal bleeding); underweight; chronic Pain; Palliative Care following in the Home.  CXR: Low lung volumes with persistent trace bilateral pleural  effusions. Type of Study: Bedside  Swallow Evaluation Previous Swallow Assessment: 07/2020 - d/c'd on a mech soft diet Diet Prior to this Study: Dysphagia 3 (soft);Thin liquids Temperature Spikes Noted: No (wbc 23.9) Respiratory Status: Nasal cannula (4L - does not leave it on consistently) History of Recent Intubation: No Behavior/Cognition: Alert;Cooperative;Pleasant mood;Confused;Distractible;Requires cueing Oral Cavity Assessment: Dry (lips) Oral Care Completed by SLP: Yes Oral Cavity - Dentition: Adequate natural dentition (missing few) Vision: Functional for self-feeding Self-Feeding Abilities: Able to feed self;Needs assist;Needs set up (increased movements of UEs) Patient Positioning: Upright in bed (independently) Baseline Vocal Quality: Normal (mumbled at times) Volitional Cough: Strong;Congested Volitional Swallow: Able to elicit    Oral/Motor/Sensory Function Overall Oral Motor/Sensory Function: Within functional limits   Ice Chips Ice chips: Within functional limits Presentation: Spoon (3 trials)   Thin Liquid Thin Liquid: Impaired Presentation: Cup;Self Fed (4 trials) Oral Phase Impairments: Poor awareness of bolus Pharyngeal  Phase Impairments: Cough - Immediate (x1/4)    Nectar Thick Nectar Thick Liquid: Within functional limits Presentation: Cup;Self Fed;Straw (6 single sips) Other Comments: Mod cues   Honey Thick Honey Thick Liquid: Not tested   Puree Puree: Within functional limits Presentation: Spoon (fed; 6 trials)   Solid     Solid: Impaired Presentation: Spoon (fed; 5 trials) Oral Phase Impairments: Poor awareness of bolus;Impaired mastication Pharyngeal Phase Impairments: Throat Clearing - Delayed (x1/5)     [name], MS, CCC-SLP Speech Language Pathologist Rehab Services [phone] [facility] 11-10-20,4:16 PM

## 2020-10-28 DEATH — deceased

## 2020-10-31 ENCOUNTER — Ambulatory Visit: Payer: Medicare Other | Admitting: Family Medicine

## 2020-11-03 ENCOUNTER — Ambulatory Visit: Payer: Medicare Other | Admitting: Cardiovascular Disease

## 2021-03-31 ENCOUNTER — Ambulatory Visit: Payer: Medicare Other
